# Patient Record
Sex: Female | Born: 1960 | Race: Black or African American | Hispanic: No | Smoking: Former smoker
Health system: Southern US, Community
[De-identification: ages and names within clinical notes are randomized; demographics above are authoritative.]

## PROBLEM LIST (undated history)

## (undated) DIAGNOSIS — M797 Fibromyalgia: Secondary | ICD-10-CM

## (undated) DIAGNOSIS — J45909 Unspecified asthma, uncomplicated: Secondary | ICD-10-CM

## (undated) DIAGNOSIS — E785 Hyperlipidemia, unspecified: Secondary | ICD-10-CM

## (undated) DIAGNOSIS — G4730 Sleep apnea, unspecified: Secondary | ICD-10-CM

## (undated) DIAGNOSIS — R7989 Other specified abnormal findings of blood chemistry: Secondary | ICD-10-CM

## (undated) DIAGNOSIS — R519 Headache, unspecified: Secondary | ICD-10-CM

## (undated) DIAGNOSIS — K219 Gastro-esophageal reflux disease without esophagitis: Secondary | ICD-10-CM

## (undated) DIAGNOSIS — K5792 Diverticulitis of intestine, part unspecified, without perforation or abscess without bleeding: Secondary | ICD-10-CM

## (undated) DIAGNOSIS — Z8739 Personal history of other diseases of the musculoskeletal system and connective tissue: Secondary | ICD-10-CM

## (undated) DIAGNOSIS — J449 Chronic obstructive pulmonary disease, unspecified: Secondary | ICD-10-CM

## (undated) DIAGNOSIS — F32A Depression, unspecified: Secondary | ICD-10-CM

## (undated) DIAGNOSIS — R011 Cardiac murmur, unspecified: Secondary | ICD-10-CM

## (undated) DIAGNOSIS — Z9989 Dependence on other enabling machines and devices: Secondary | ICD-10-CM

## (undated) DIAGNOSIS — I10 Essential (primary) hypertension: Secondary | ICD-10-CM

## (undated) DIAGNOSIS — M5136 Other intervertebral disc degeneration, lumbar region: Secondary | ICD-10-CM

## (undated) DIAGNOSIS — R51 Headache: Secondary | ICD-10-CM

## (undated) DIAGNOSIS — M51369 Other intervertebral disc degeneration, lumbar region without mention of lumbar back pain or lower extremity pain: Secondary | ICD-10-CM

## (undated) DIAGNOSIS — K760 Fatty (change of) liver, not elsewhere classified: Secondary | ICD-10-CM

## (undated) DIAGNOSIS — H8110 Benign paroxysmal vertigo, unspecified ear: Secondary | ICD-10-CM

## (undated) DIAGNOSIS — F329 Major depressive disorder, single episode, unspecified: Secondary | ICD-10-CM

## (undated) HISTORY — PX: OTHER SURGICAL HISTORY: SHX169

## (undated) HISTORY — DX: Chronic obstructive pulmonary disease, unspecified: J44.9

## (undated) HISTORY — DX: Hyperlipidemia, unspecified: E78.5

## (undated) HISTORY — DX: Personal history of other diseases of the musculoskeletal system and connective tissue: Z87.39

## (undated) HISTORY — DX: Essential (primary) hypertension: I10

## (undated) HISTORY — PX: ABDOMINAL HYSTERECTOMY: SHX81

## (undated) HISTORY — DX: Other specified abnormal findings of blood chemistry: R79.89

## (undated) HISTORY — DX: Cardiac murmur, unspecified: R01.1

## (undated) HISTORY — DX: Unspecified asthma, uncomplicated: J45.909

## (undated) HISTORY — PX: ABDOMINAL SURGERY: SHX537

## (undated) HISTORY — DX: Dependence on other enabling machines and devices: Z99.89

---

## 2002-04-22 ENCOUNTER — Emergency Department (HOSPITAL_COMMUNITY): Admission: EM | Admit: 2002-04-22 | Discharge: 2002-04-22 | Payer: Self-pay | Admitting: Emergency Medicine

## 2003-11-30 ENCOUNTER — Inpatient Hospital Stay (HOSPITAL_COMMUNITY): Admission: EM | Admit: 2003-11-30 | Discharge: 2003-12-15 | Payer: Self-pay | Admitting: Emergency Medicine

## 2003-12-25 ENCOUNTER — Emergency Department (HOSPITAL_COMMUNITY): Admission: EM | Admit: 2003-12-25 | Discharge: 2003-12-25 | Payer: Self-pay | Admitting: Emergency Medicine

## 2004-01-02 ENCOUNTER — Emergency Department: Payer: Self-pay | Admitting: General Practice

## 2004-01-04 ENCOUNTER — Emergency Department: Payer: Self-pay | Admitting: Emergency Medicine

## 2005-10-15 ENCOUNTER — Emergency Department: Payer: Self-pay | Admitting: Emergency Medicine

## 2006-11-15 ENCOUNTER — Emergency Department (HOSPITAL_COMMUNITY): Admission: EM | Admit: 2006-11-15 | Discharge: 2006-11-15 | Payer: Self-pay | Admitting: Emergency Medicine

## 2007-04-03 ENCOUNTER — Emergency Department (HOSPITAL_COMMUNITY): Admission: EM | Admit: 2007-04-03 | Discharge: 2007-04-03 | Payer: Self-pay | Admitting: Family Medicine

## 2007-05-11 ENCOUNTER — Emergency Department (HOSPITAL_COMMUNITY): Admission: EM | Admit: 2007-05-11 | Discharge: 2007-05-11 | Payer: Self-pay | Admitting: Emergency Medicine

## 2007-07-07 ENCOUNTER — Ambulatory Visit: Payer: Self-pay | Admitting: Internal Medicine

## 2007-07-22 ENCOUNTER — Ambulatory Visit: Payer: Self-pay | Admitting: Internal Medicine

## 2007-09-30 ENCOUNTER — Ambulatory Visit: Payer: Self-pay | Admitting: Internal Medicine

## 2007-10-13 ENCOUNTER — Ambulatory Visit: Payer: Self-pay | Admitting: *Deleted

## 2007-11-29 ENCOUNTER — Emergency Department (HOSPITAL_COMMUNITY): Admission: EM | Admit: 2007-11-29 | Discharge: 2007-11-30 | Payer: Self-pay | Admitting: *Deleted

## 2009-01-01 ENCOUNTER — Emergency Department: Payer: Self-pay | Admitting: Emergency Medicine

## 2009-01-23 ENCOUNTER — Emergency Department: Payer: Self-pay | Admitting: Emergency Medicine

## 2009-06-05 ENCOUNTER — Emergency Department (HOSPITAL_COMMUNITY): Admission: EM | Admit: 2009-06-05 | Discharge: 2009-06-05 | Payer: Self-pay | Admitting: Emergency Medicine

## 2010-03-10 ENCOUNTER — Emergency Department (HOSPITAL_COMMUNITY)
Admission: EM | Admit: 2010-03-10 | Discharge: 2010-03-10 | Payer: Self-pay | Source: Home / Self Care | Admitting: Emergency Medicine

## 2010-07-25 ENCOUNTER — Emergency Department (HOSPITAL_COMMUNITY)
Admission: EM | Admit: 2010-07-25 | Discharge: 2010-07-25 | Disposition: A | Discharge status: Home / Self Care | Payer: Self-pay | Attending: Emergency Medicine | Admitting: Emergency Medicine

## 2010-07-25 DIAGNOSIS — K029 Dental caries, unspecified: Secondary | ICD-10-CM | POA: Insufficient documentation

## 2010-07-25 DIAGNOSIS — R21 Rash and other nonspecific skin eruption: Secondary | ICD-10-CM | POA: Insufficient documentation

## 2010-07-25 DIAGNOSIS — K089 Disorder of teeth and supporting structures, unspecified: Secondary | ICD-10-CM | POA: Insufficient documentation

## 2010-07-25 LAB — GLUCOSE, CAPILLARY

## 2010-08-10 NOTE — H&P (Signed)
NAMESHARMEL, BALLANTINE NO.:  [id number]   MEDICAL RECORD NO.:  [id number]                   PATIENT TYPE:  EMS   LOCATION:  MAJO                                 FACILITY:  [facility]   PHYSICIAN:  Ollen Gross. Vernell Morgans, M.D.              DATE OF BIRTH:  [birth year]   DATE OF ADMISSION:  11/30/2003  DATE OF DISCHARGE:                                HISTORY & PHYSICAL   [name] is a 50 year old black female who presents today to the  emergency department with abdominal pain that started on Saturday.  She  describes the pain as diffuse, pain is not improved at all.  She has also  had nausea and vomiting.  She denies any fevers.  When the pain did not get  any better, she was brought to the emergency department today.  She denies  any fevers, chills, chest pain, shortness of breath, diarrhea, dysuria.  The  rest of the review of systems is unremarkable.   PAST MEDICAL HISTORY:  Significant for fibromyalgia, diabetes, asthma, and  acid reflux.   PAST SURGICAL HISTORY:  Significant for a hysterectomy.   MEDICATIONS:  Some diabetes medicine.   ALLERGIES:  BEXTRA.   SOCIAL HISTORY:  She has smoked in the recent past but denies any alcohol  use.   FAMILY HISTORY:  Noncontributory.   PHYSICAL EXAMINATION:  VITAL SIGNS:  Her temperature is 97.7, pulse of 91,  blood pressure 132/84.  GENERAL:  She is a well-developed, well-nourished black female in no acute  distress.  On psychological, she is alert and oriented x3 with no evidence  of state of anxiety or depression.  SKIN:  Warm and dry with no jaundice.  HEENT:  Eyes:  Her extraocular muscles are intact.  Pupils equal, round, and  reactive to light.  Sclerae are nonicteric.   The rest of her actual physical exam the patient refused.   Her labs were reviewed and were significant for a white count of 16,200.  Her CT scan was reviewed with the radiologist and was significant for a mid-  small bowel  obstruction with no free fluid or free air.   ASSESSMENT AND PLAN:  This is a 50 year old black female with what appears  to be a high-grade mid-small bowel obstruction.  She refuses any physical  exam at this point.  She does have an elevated white count at 16,200.  We  will plan to admit her for IV hydration, bowel rest, NG tube suctioning, and  close monitoring.  We will repeat  her abdominal films in the morning.  If she improves, then we may continue  her observation.  If she does not improve, she may require an exploration to  fix her bowel obstruction.  I have discussed this with her and her family,  and they seem to understand and agree with this treatment plan.  Ollen Gross. Vernell Morgans, M.D.    PST/MEDQ  D:  11/30/2003  T:  11/30/2003  Job:  [id number]

## 2010-08-10 NOTE — Op Note (Signed)
[name], [name] NO.:  [id number]   MEDICAL RECORD NO.:  [id number]                   PATIENT TYPE:  INP   LOCATION:  5706                                 FACILITY:  [facility]   PHYSICIAN:  Jimmye Norman III, M.D.               DATE OF BIRTH:  10/07/1960   DATE OF PROCEDURE:  DATE OF DISCHARGE:                                 OPERATIVE REPORT   DATE OF OPERATION:  December 02, 2003.   PREOPERATIVE DIAGNOSIS:  Small bowel obstruction.   POSTOPERATIVE DIAGNOSIS:  Small bowel obstruction secondary to adhesions.   PROCEDURE:  1.  Exploratory laparotomy.  2.  Enterolysis.  3.  Enterorrhaphy   SURGEON:  Jimmye Norman, MD.   ASSISTANT:  Gabrielle Dare. Janee Morn, MD.   ANESTHESIA:  General endotracheal.   ESTIMATED BLOOD LOSS:  Less than 50 mL.   COMPLICATIONS:  Small bowel injury.   CONDITION:  Stable.   INDICATION FOR OPERATION:  The patient is a 50 year old female with a small  bowel obstruction, which was not resolving with NG tube decompression, who  now comes to the OR for exploration.   FINDINGS:  The patient had a high-grade, complete, small bowel obstruction  to the mid to distal jejunum.  It was secondary to a twist of the small  bowel, which was attached to the anterior abdominal wall fascia.   OPERATION:  The patient was taken to the operating room and placed on the  table in the supine position.  After an adequate endotracheal anesthetic was  administered, she was prepped and draped in the usual sterile manner and  exposed to the midline.   A #10 blade was used to make a midline incision about 4 cm above the  umbilicus to just below the umbilicus.  It was taken to the right of the  umbilicus, down through the midline fascia.  As we got into the midline  fascia just above the umbilicus, we were able to dissect down and find that  there were adhesions of omentum and small bowel to the anterior abdominal  wall.  Just below the umbilicus, a  small bowel enterotomy was made as we  were getting through the fascia using electrocautery.  This was subsequently  repaired with 3-0 silk Lembert stitches, completely closing it off to an  efflux of fluid, which was tested with pressure from both sides.   We took down the adhesions of the other small bowel to the anterior  abdominal wall, and also the omentum down into the pelvis.  There were deep  pelvic adhesions of small bowel into the pelvis, which were taken down with  Metzenbaum scissors and also with cautery, and we subsequently removed all  adhesions to the small bowel heading towards the pelvis.  We ran the small  bowel from the ligament of Treitz all the way to the terminal ileum, where  there were no further adhesions found and  no other injuries.  The repaired  bowel looked fine and no evidence of leakage.  We milked the effluent of  fluid from the dilated loops, which were proximal to where it was attached  to the anterior fascia to the ligament of Treitz and into the stomach, where  it was subsequently aspirated out.  The patient did well.  We palpated the  NG tube to be in proper position intraoperatively.  We had completely  finished with the milking and the freeing of all adhesions, we explored the  liver, gallbladder, and so forth, and they all appeared to be fine.  There  was no other evidence of injury or disease processes.  There was no evidence  of appendix left in place; however, we did not specifically mobilize the  cecum for this.   We irrigated with copious amounts of warm saline solution, then we  reapproximated the fascia using a running #1 PDS suture, we closed the skin  using stainless steel staples.  Sterile dressings were applied including  antibiotic ointment.                                               Kathrin Ruddy, M.D.    JW/MEDQ  D:  12/02/2003  T:  12/02/2003  Job:  [id number]

## 2010-08-10 NOTE — Discharge Summary (Signed)
NAMESECILIA, APPS NO.:  [id number]   MEDICAL RECORD NO.:  [id number]          PATIENT TYPE:  INP   LOCATION:  5706                         FACILITY:  [facility]   PHYSICIAN:  Jimmye Norman III, M.D.  DATE OF BIRTH:  03/04/1961   DATE OF ADMISSION:  11/30/2003  DATE OF DISCHARGE:  12/15/2003                                 DISCHARGE SUMMARY   DISCHARGE DIAGNOSIS:  Mechanical small bowel obstruction from adhesions.   DISCHARGE MEDICATIONS:  Darvocet p.r.n. for pain.   DISCHARGE INSTRUCTIONS:  Diet unrestricted.  She is to follow up to see me  in about two weeks.   CONDITION ON DISCHARGE:  Good.   HOSPITAL COURSE:  The patient was admitted by Dr. Carolynne Edouard with a small bowel  obstruction.  In his absence, I assumed the patient's care.  Because of  nonresolution of her symptoms on the first two days of her hospitalization,  she was taken to surgery on September 9 at which time she had an exploratory  laparotomy, enterolysis, and enterorrhaphy for a small bowel enterotomy  which was minor.  Postoperatively, she did fairly well, however, it took  several days for her NG tube drainage to cease to where clamping could be  tolerated and she could be started on a diet.  She was on TNA for a good  portion of this time.  Attempts had been made to remove her NG tube  initially, however, this was followed by prompt nausea and vomiting and the  need to reinsert the NG tube.  After the second insertion after about five  days, her bowel was recovered, and she was able to start back on a diet, and  was subsequently discharged to home.       JW/MEDQ  D:  01/12/2004  T:  01/12/2004  Job:  [id number]   cc:   Ollen Gross. Vernell Morgans, M.D.  [address]  [place]  [place] [address]  Fax: [phone]

## 2010-11-28 ENCOUNTER — Emergency Department (HOSPITAL_COMMUNITY)
Admission: EM | Admit: 2010-11-28 | Discharge: 2010-11-28 | Disposition: A | Discharge status: Home / Self Care | Payer: Self-pay | Attending: Emergency Medicine | Admitting: Emergency Medicine

## 2010-11-28 ENCOUNTER — Emergency Department (HOSPITAL_COMMUNITY): Payer: Self-pay

## 2010-11-28 ENCOUNTER — Encounter (HOSPITAL_COMMUNITY): Payer: Self-pay

## 2010-11-28 DIAGNOSIS — R509 Fever, unspecified: Secondary | ICD-10-CM | POA: Insufficient documentation

## 2010-11-28 DIAGNOSIS — F329 Major depressive disorder, single episode, unspecified: Secondary | ICD-10-CM | POA: Insufficient documentation

## 2010-11-28 DIAGNOSIS — K219 Gastro-esophageal reflux disease without esophagitis: Secondary | ICD-10-CM | POA: Insufficient documentation

## 2010-11-28 DIAGNOSIS — R221 Localized swelling, mass and lump, neck: Secondary | ICD-10-CM | POA: Insufficient documentation

## 2010-11-28 DIAGNOSIS — R599 Enlarged lymph nodes, unspecified: Secondary | ICD-10-CM | POA: Insufficient documentation

## 2010-11-28 DIAGNOSIS — F3289 Other specified depressive episodes: Secondary | ICD-10-CM | POA: Insufficient documentation

## 2010-11-28 DIAGNOSIS — K029 Dental caries, unspecified: Secondary | ICD-10-CM | POA: Insufficient documentation

## 2010-11-28 DIAGNOSIS — E119 Type 2 diabetes mellitus without complications: Secondary | ICD-10-CM | POA: Insufficient documentation

## 2010-11-28 DIAGNOSIS — H9209 Otalgia, unspecified ear: Secondary | ICD-10-CM | POA: Insufficient documentation

## 2010-11-28 DIAGNOSIS — R070 Pain in throat: Secondary | ICD-10-CM | POA: Insufficient documentation

## 2010-11-28 DIAGNOSIS — R220 Localized swelling, mass and lump, head: Secondary | ICD-10-CM | POA: Insufficient documentation

## 2010-11-28 DIAGNOSIS — R1310 Dysphagia, unspecified: Secondary | ICD-10-CM | POA: Insufficient documentation

## 2010-11-28 HISTORY — DX: Fibromyalgia: M79.7

## 2010-11-28 HISTORY — DX: Gastro-esophageal reflux disease without esophagitis: K21.9

## 2010-11-28 LAB — CBC
HCT: 36.9 % (ref 36.0–46.0)
Hemoglobin: 12.4 g/dL (ref 12.0–15.0)
MCHC: 33.6 g/dL (ref 30.0–36.0)
MCV: 85 fL (ref 78.0–100.0)
Platelets: 230 10*3/uL (ref 150–400)
RDW: 14.1 % (ref 11.5–15.5)
WBC: 11.3 10*3/uL — ABNORMAL HIGH (ref 4.0–10.5)

## 2010-11-28 LAB — BASIC METABOLIC PANEL
BUN: 10 mg/dL (ref 6–23)
CO2: 22 mEq/L (ref 19–32)
Calcium: 9.4 mg/dL (ref 8.4–10.5)
Chloride: 105 mEq/L (ref 96–112)
Potassium: 3.6 mEq/L (ref 3.5–5.1)
Sodium: 138 mEq/L (ref 135–145)

## 2010-11-28 MED ORDER — IOHEXOL 300 MG/ML  SOLN
75.0000 mL | Freq: Once | INTRAMUSCULAR | Status: AC | PRN
  Administered 2010-11-28: 75 mL via INTRAVENOUS

## 2010-12-03 ENCOUNTER — Emergency Department (HOSPITAL_COMMUNITY)
Admission: EM | Admit: 2010-12-03 | Discharge: 2010-12-03 | Disposition: A | Discharge status: Home / Self Care | Payer: Self-pay | Attending: Emergency Medicine | Admitting: Emergency Medicine

## 2010-12-03 DIAGNOSIS — E119 Type 2 diabetes mellitus without complications: Secondary | ICD-10-CM | POA: Insufficient documentation

## 2010-12-03 DIAGNOSIS — R221 Localized swelling, mass and lump, neck: Secondary | ICD-10-CM | POA: Insufficient documentation

## 2010-12-03 DIAGNOSIS — F329 Major depressive disorder, single episode, unspecified: Secondary | ICD-10-CM | POA: Insufficient documentation

## 2010-12-03 DIAGNOSIS — R220 Localized swelling, mass and lump, head: Secondary | ICD-10-CM | POA: Insufficient documentation

## 2010-12-03 DIAGNOSIS — K089 Disorder of teeth and supporting structures, unspecified: Secondary | ICD-10-CM | POA: Insufficient documentation

## 2010-12-03 DIAGNOSIS — K219 Gastro-esophageal reflux disease without esophagitis: Secondary | ICD-10-CM | POA: Insufficient documentation

## 2010-12-03 DIAGNOSIS — F3289 Other specified depressive episodes: Secondary | ICD-10-CM | POA: Insufficient documentation

## 2010-12-03 DIAGNOSIS — K047 Periapical abscess without sinus: Secondary | ICD-10-CM | POA: Insufficient documentation

## 2010-12-14 LAB — HEPATIC FUNCTION PANEL
ALT: 17
Total Protein: 7.2

## 2010-12-14 LAB — CBC
HCT: 42
MCHC: 33.2
MCV: 86.3
Platelets: 266
RDW: 14.1
WBC: 9.9

## 2010-12-14 LAB — I-STAT 8, (EC8 V) (CONVERTED LAB)
Acid-base deficit: 2
Bicarbonate: 23.7
Glucose, Bld: 102 — ABNORMAL HIGH
Operator id: 198171
Potassium: 4
Sodium: 139
TCO2: 25
pCO2, Ven: 40.9 — ABNORMAL LOW

## 2010-12-14 LAB — URINALYSIS, ROUTINE W REFLEX MICROSCOPIC
Bilirubin Urine: NEGATIVE
Glucose, UA: NEGATIVE
Ketones, ur: NEGATIVE
Nitrite: NEGATIVE
Urobilinogen, UA: 0.2

## 2010-12-14 LAB — DIFFERENTIAL
Basophils Relative: 1
Eosinophils Absolute: 0.3
Monocytes Relative: 6
Neutro Abs: 5.8

## 2010-12-14 LAB — PREGNANCY, URINE: Preg Test, Ur: NEGATIVE

## 2010-12-14 LAB — POCT I-STAT CREATININE
Creatinine, Ser: 0.7
Operator id: 198171

## 2011-12-26 ENCOUNTER — Emergency Department (HOSPITAL_COMMUNITY)
Admission: EM | Admit: 2011-12-26 | Discharge: 2011-12-26 | Disposition: A | Discharge status: Home / Self Care | Payer: Self-pay | Attending: Emergency Medicine | Admitting: Emergency Medicine

## 2011-12-26 ENCOUNTER — Encounter (HOSPITAL_COMMUNITY): Payer: Self-pay | Admitting: Emergency Medicine

## 2011-12-26 DIAGNOSIS — G4700 Insomnia, unspecified: Secondary | ICD-10-CM | POA: Insufficient documentation

## 2011-12-26 DIAGNOSIS — F430 Acute stress reaction: Secondary | ICD-10-CM | POA: Insufficient documentation

## 2011-12-26 DIAGNOSIS — F439 Reaction to severe stress, unspecified: Secondary | ICD-10-CM

## 2011-12-26 DIAGNOSIS — E119 Type 2 diabetes mellitus without complications: Secondary | ICD-10-CM | POA: Insufficient documentation

## 2011-12-26 DIAGNOSIS — Z8719 Personal history of other diseases of the digestive system: Secondary | ICD-10-CM | POA: Insufficient documentation

## 2011-12-26 DIAGNOSIS — IMO0001 Reserved for inherently not codable concepts without codable children: Secondary | ICD-10-CM | POA: Insufficient documentation

## 2011-12-26 DIAGNOSIS — J45909 Unspecified asthma, uncomplicated: Secondary | ICD-10-CM | POA: Insufficient documentation

## 2011-12-26 DIAGNOSIS — F17200 Nicotine dependence, unspecified, uncomplicated: Secondary | ICD-10-CM | POA: Insufficient documentation

## 2011-12-26 MED ORDER — CELECOXIB 100 MG PO CAPS: 100.0000 mg | ORAL_CAPSULE | Freq: Two times a day (BID) | ORAL | Status: DC

## 2011-12-26 NOTE — ED Notes (Signed)
States husband in ICU here for two weeks- feels exhausted, tired, but unable to sleep. Fibromyalgia pain in legs. Was a [facility] patient-- no reg doctor now.

## 2011-12-26 NOTE — ED Notes (Signed)
MD at bedside. 

## 2011-12-26 NOTE — ED Notes (Signed)
Pt reports hx fibromyalgia, reports increasing pain "all over" due to inability to get celebrex from [facility]. Pt reports increased stress due to friend in ICU and on vent, having difficulty dealing with anxiety and stress. Reports insomnia and depression.

## 2011-12-26 NOTE — ED Provider Notes (Signed)
History     CSN: [id number]  Arrival date & time 12/26/11  8295   First MD Initiated Contact with Patient 12/26/11 0815      Chief Complaint  Patient presents with  . Insomnia  . fibromyalgia pain     (Consider location/radiation/quality/duration/timing/severity/associated sxs/prior treatment) HPI The patient presents with 2 main complaints. First, she has a history of fibromyalgia, for which she used to take Celebrex.  She notes that she has been out of his medication for some time.  The clinic that she obtain her medications from closed.  She notes over the past weeks she is developed diffuse soreness, consistent with prior fibromyalgia episodes.  She denies focal pain, fevers, chills, vomiting, nausea. The patient also complains of new insomnia.  She states over the past weeks she is become increasingly insomniac. Notably, the patient is currently at this facility with great frequency do to a colleague who is in the ICU.  She also this has caused her a significant amount of stress. Past Medical History  Diagnosis Date  . Asthma   . Diabetes mellitus   . GERD (gastroesophageal reflux disease)   . Fibromyalgia   . Fibromyalgia     Past Surgical History  Procedure Date  . Abdominal surgery     No family history on file.  History  Substance Use Topics  . Smoking status: Current Every Day Smoker -- 0.5 packs/day  . Smokeless tobacco: Not on file  . Alcohol Use: Yes     stopped 1 week ago, social drinker    OB History    Grav Para Term Preterm Abortions TAB SAB Ect Mult Living                  Review of Systems  All other systems reviewed and are negative.    Allergies  Bactrim and Benadryl  Home Medications   Current Outpatient Rx  Name Route Sig Dispense Refill  . CELECOXIB 100 MG PO CAPS Oral Take 1 capsule (100 mg total) by mouth 2 (two) times daily. 30 capsule 0    BP 119/75  Pulse 100  Temp 97.8 F (36.6 C) (Oral)  Resp 16  SpO2  100%  Physical Exam  Nursing note and vitals reviewed. Constitutional: She is oriented to person, place, and time. She appears well-developed and well-nourished. No distress.  HENT:  Head: Normocephalic and atraumatic.  Eyes: Conjunctivae normal and EOM are normal.  Cardiovascular: Normal rate and regular rhythm.   Pulmonary/Chest: Effort normal and breath sounds normal. No stridor. No respiratory distress.  Abdominal: She exhibits no distension.  Musculoskeletal: She exhibits no edema.  Neurological: She is alert and oriented to person, place, and time. No cranial nerve deficit.  Skin: Skin is warm and dry.  Psychiatric: She has a normal mood and affect.    ED Course  Procedures (including critical care time)  Labs Reviewed - No data to display No results found.   1. Insomnia   2. Stress       MDM  This generally well-appearing female with fibromyalgia, no current medication use presents with concerns of diffuse pain, insomnia.  On exam she is in no distress with unremarkable vital signs.  We discussed the need for reinitiation of her medication, with only single therapy prior to starting multiple medications for insomnia and fibromyalgia.  We discussed the need for primary care followup, the patient was discharged in stable condition with outpatient follow up resources, restarting of her Celebrex.  Gerhard Munch, MD 12/26/11 [phone]

## 2011-12-28 ENCOUNTER — Encounter (HOSPITAL_COMMUNITY): Payer: Self-pay | Admitting: Emergency Medicine

## 2011-12-28 ENCOUNTER — Emergency Department (HOSPITAL_COMMUNITY)
Admission: EM | Admit: 2011-12-28 | Discharge: 2011-12-28 | Disposition: A | Discharge status: Home / Self Care | Payer: Self-pay | Attending: Emergency Medicine | Admitting: Emergency Medicine

## 2011-12-28 DIAGNOSIS — M549 Dorsalgia, unspecified: Secondary | ICD-10-CM | POA: Insufficient documentation

## 2011-12-28 DIAGNOSIS — M797 Fibromyalgia: Secondary | ICD-10-CM

## 2011-12-28 DIAGNOSIS — IMO0001 Reserved for inherently not codable concepts without codable children: Secondary | ICD-10-CM | POA: Insufficient documentation

## 2011-12-28 DIAGNOSIS — Z23 Encounter for immunization: Secondary | ICD-10-CM | POA: Insufficient documentation

## 2011-12-28 MED ORDER — MELOXICAM 7.5 MG PO TABS
7.5000 mg | ORAL_TABLET | Freq: Once | ORAL | Status: AC
  Administered 2011-12-28: 7.5 mg via ORAL
  Filled 2011-12-28: qty 1

## 2011-12-28 MED ORDER — MELOXICAM 7.5 MG PO TABS: 7.5000 mg | ORAL_TABLET | Freq: Every day | ORAL | Status: DC

## 2011-12-28 NOTE — ED Notes (Signed)
Pt requesting medication for pain.

## 2012-01-11 NOTE — ED Provider Notes (Signed)
History     CSN: [id number]  Arrival date & time 12/28/11  1116   First MD Initiated Contact with Patient 12/28/11 1238      Chief Complaint  Patient presents with  . Medication Refill    (Consider location/radiation/quality/duration/timing/severity/associated sxs/prior treatment) Patient is a 51 y.o. female presenting with leg pain. The history is provided by the patient.  Leg Pain  The incident occurred 2 days ago. There was no injury mechanism. Associated symptoms comments: Patient with a history of fibromyalgia with foci of pain in thighs, presents with thigh pain. She has been caring for her husband and under stress with his hospitalization and feels she has exacerbated her usual symptoms of fibromyalgia. No fever, injury, SOB, N, V..    Past Medical History  Diagnosis Date  . Asthma   . Diabetes mellitus   . GERD (gastroesophageal reflux disease)   . Fibromyalgia   . Fibromyalgia     Past Surgical History  Procedure Date  . Abdominal surgery     Family History  Problem Relation Age of Onset  . Diabetes Mother   . Hypertension Mother     History  Substance Use Topics  . Smoking status: Current Every Day Smoker -- 0.5 packs/day    Types: Cigarettes  . Smokeless tobacco: Not on file  . Alcohol Use: Yes     stopped 1 week ago, social drinker    OB History    Grav Para Term Preterm Abortions TAB SAB Ect Mult Living                  Review of Systems  Constitutional: Negative for fever and chills.  HENT: Negative.   Respiratory: Negative.   Cardiovascular: Negative.   Gastrointestinal: Negative.   Musculoskeletal:       See HPI.  Skin: Negative.   Neurological: Negative.     Allergies  Bactrim and Benadryl  Home Medications   Current Outpatient Rx  Name Route Sig Dispense Refill  . IBUPROFEN 200 MG PO TABS Oral Take 800 mg by mouth every 6 (six) hours as needed. For pain    . CELECOXIB 100 MG PO CAPS Oral Take 1 capsule (100 mg total) by  mouth 2 (two) times daily. 30 capsule 0  . MELOXICAM 7.5 MG PO TABS Oral Take 1 tablet (7.5 mg total) by mouth daily. 30 tablet 0    BP 164/95  Pulse 85  Temp 97.6 F (36.4 C) (Oral)  Resp 18  Wt 113 lb (51.256 kg)  SpO2 97%  Physical Exam  Constitutional: She is oriented to person, place, and time. She appears well-developed and well-nourished.  Neck: Normal range of motion.  Cardiovascular: Normal rate and regular rhythm.   No murmur heard.      Distal pulses in LE's present and equal.  Pulmonary/Chest: Effort normal and breath sounds normal. She has no wheezes. She has no rales. She exhibits no tenderness.  Abdominal: Soft. There is no tenderness. There is no rebound and no guarding.  Musculoskeletal: Normal range of motion. She exhibits no edema.       Thighs bilaterally tender without redness, swelling, discoloration.   Neurological: She is alert and oriented to person, place, and time.  Skin: Skin is warm and dry.    ED Course  Procedures (including critical care time)  Labs Reviewed - No data to display No results found.   1. Back pain   2. Fibromyalgia       MDM  Usual pain of fibromyalgia without other concerning finding.        Rodena Medin, PA-C 01/11/12 1201

## 2012-01-13 NOTE — ED Provider Notes (Signed)
Medical screening examination/treatment/procedure(s) were performed by non-physician practitioner and as supervising physician I was immediately available for consultation/collaboration.  Gerhard Munch, MD 01/13/12 2154

## 2013-01-05 ENCOUNTER — Emergency Department (HOSPITAL_COMMUNITY)
Admission: EM | Admit: 2013-01-05 | Discharge: 2013-01-05 | Disposition: A | Discharge status: Home / Self Care | Payer: Self-pay | Attending: Emergency Medicine | Admitting: Emergency Medicine

## 2013-01-05 ENCOUNTER — Encounter (HOSPITAL_COMMUNITY): Payer: Self-pay | Admitting: Emergency Medicine

## 2013-01-05 DIAGNOSIS — IMO0001 Reserved for inherently not codable concepts without codable children: Secondary | ICD-10-CM | POA: Insufficient documentation

## 2013-01-05 DIAGNOSIS — G501 Atypical facial pain: Secondary | ICD-10-CM | POA: Insufficient documentation

## 2013-01-05 DIAGNOSIS — F1010 Alcohol abuse, uncomplicated: Secondary | ICD-10-CM | POA: Insufficient documentation

## 2013-01-05 DIAGNOSIS — F329 Major depressive disorder, single episode, unspecified: Secondary | ICD-10-CM

## 2013-01-05 DIAGNOSIS — F17200 Nicotine dependence, unspecified, uncomplicated: Secondary | ICD-10-CM | POA: Insufficient documentation

## 2013-01-05 DIAGNOSIS — F3289 Other specified depressive episodes: Secondary | ICD-10-CM | POA: Insufficient documentation

## 2013-01-05 DIAGNOSIS — J45909 Unspecified asthma, uncomplicated: Secondary | ICD-10-CM | POA: Insufficient documentation

## 2013-01-05 DIAGNOSIS — K219 Gastro-esophageal reflux disease without esophagitis: Secondary | ICD-10-CM | POA: Insufficient documentation

## 2013-01-05 DIAGNOSIS — R42 Dizziness and giddiness: Secondary | ICD-10-CM | POA: Insufficient documentation

## 2013-01-05 DIAGNOSIS — E119 Type 2 diabetes mellitus without complications: Secondary | ICD-10-CM | POA: Insufficient documentation

## 2013-01-05 MED ORDER — HYDROCODONE-ACETAMINOPHEN 5-325 MG PO TABS
1.0000 | ORAL_TABLET | Freq: Once | ORAL | Status: AC
  Administered 2013-01-05: 1 via ORAL
  Filled 2013-01-05: qty 1

## 2013-01-05 MED ORDER — IBUPROFEN 600 MG PO TABS: 600.0000 mg | ORAL_TABLET | Freq: Three times a day (TID) | ORAL | Status: DC | PRN

## 2013-01-05 NOTE — ED Notes (Signed)
Pt escorted to discharge window. Verbalized understanding discharge instructions. In no acute distress.  Vitals are stable.

## 2013-01-05 NOTE — ED Notes (Addendum)
Pt from home w/  C/o of left sided facial pain radiating to left chest and arm  for "a couple months". Pt states she has been putting it off d/t friend has been this sick. Pt states she has been dizzy and blacking out. Last black out happened a "couple a weeks ago". Pt has HX of fibromyalgia.

## 2013-01-05 NOTE — ED Provider Notes (Signed)
CSN: [id number]     Arrival date & time 01/05/13  1134 History   First MD Initiated Contact with Patient 01/05/13 1148     Chief Complaint  Patient presents with  . Facial Pain    HPI Patient reports ongoing left-sided facial pain over the past 4-5 months.  She states that her left-sided facial pain is been constant and will occasionally radiate towards her left chest.  She denies palpitations.  No shortness of breath.  She has ongoing discomfort at this time.  No dental pain.  No difficulty breathing or swallowing.  No nausea vomiting or diarrhea.  She does have some intermittent lightheadedness.  She's been eating and drinking normally.  She does not have a primary care physician.  She states that she is drinking approximately 240 ounce beers every day and that this is an increased amount for her.  She states she is more sad but does not have homicidal or suicidal thoughts.  She is currently caring for a long-term partner who has terminal cancer.  She states this is causing a lot of stress and anxiety.    Past Medical History  Diagnosis Date  . Asthma   . Diabetes mellitus   . GERD (gastroesophageal reflux disease)   . Fibromyalgia   . Fibromyalgia    Past Surgical History  Procedure Laterality Date  . Abdominal surgery    . Abdominal hysterectomy     Family History  Problem Relation Age of Onset  . Diabetes Mother   . Hypertension Mother    History  Substance Use Topics  . Smoking status: Current Every Day Smoker -- 0.50 packs/day    Types: Cigarettes  . Smokeless tobacco: Current User  . Alcohol Use: No     Comment: stopped 1 week ago, social drinker   OB History   Grav Para Term Preterm Abortions TAB SAB Ect Mult Living                 Review of Systems  All other systems reviewed and are negative.    Allergies  Benadryl and Bextra  Home Medications   Current Outpatient Rx  Name  Route  Sig  Dispense  Refill  . ibuprofen (ADVIL,MOTRIN) 200 MG tablet  Oral   Take 800 mg by mouth every 6 (six) hours as needed. For pain         . ibuprofen (ADVIL,MOTRIN) 600 MG tablet   Oral   Take 1 tablet (600 mg total) by mouth every 8 (eight) hours as needed for pain.   15 tablet   0    BP 145/87  Pulse 87  Temp(Src) 97.8 F (36.6 C) (Oral)  Resp 18  Ht 4\' 7"  (1.397 m)  Wt 137 lb (62.143 kg)  BMI 31.84 kg/m2  SpO2 96% Physical Exam  Nursing note and vitals reviewed. Constitutional: She is oriented to person, place, and time. She appears well-developed and well-nourished. No distress.  HENT:  Head: Normocephalic and atraumatic.  Poor dentition without active dental pathology.  No gingival swelling or fluctuance.  No facial swelling.  Mild tenderness along the angle of her left mandible.  No swelling or ecchymosis.  No trismus or malocclusion.  Bilateral TMs are normal.  No lymphadenopathy noted in in her neck.  Thyroid normal in size.  Eyes: EOM are normal.  Neck: Normal range of motion.  Cardiovascular: Normal rate, regular rhythm and normal heart sounds.   Pulmonary/Chest: Effort normal and breath sounds normal.  Abdominal: Soft.  She exhibits no distension. There is no tenderness.  Musculoskeletal: Normal range of motion.  Neurological: She is alert and oriented to person, place, and time.  Skin: Skin is warm and dry.  Psychiatric: She has a normal mood and affect. Judgment normal.    ED Course  Procedures (including critical care time) Labs Review Labs Reviewed - No data to display Imaging Review No results found.  ECG interpretation   Date: 01/05/2013  Rate: 87  Rhythm: normal sinus rhythm  QRS Axis: normal  Intervals: normal  ST/T Wave abnormalities: normal  Conduction Disutrbances: none  Narrative Interpretation:   Old EKG Reviewed: No prior KG available     MDM   1. Facial pain   2. Depression    Ongoing facial pain for several months.  This is not a presentation of ACS.  Discharge home with primary care  followup.  She does have depression and therefore recommended that she see a psychiatrist for outpatient followup.  No suicidal or homicidal thoughts.  I recommended that she decrease her alcohol intake    Lyanne Co, MD 01/05/13 [phone]

## 2013-01-05 NOTE — ED Notes (Signed)
Pt is tearful and reports that she is stressed from taking care of her significant other.

## 2013-02-10 ENCOUNTER — Ambulatory Visit: Payer: Self-pay

## 2013-05-05 ENCOUNTER — Emergency Department: Payer: Self-pay | Admitting: Internal Medicine

## 2013-05-05 LAB — CBC WITH DIFFERENTIAL/PLATELET
Basophil #: 0.2 10*3/uL — ABNORMAL HIGH (ref 0.0–0.1)
Basophil %: 1.4 %
EOS PCT: 2.9 %
Eosinophil #: 0.3 10*3/uL (ref 0.0–0.7)
HCT: 43.4 % (ref 35.0–47.0)
HGB: 14.2 g/dL (ref 12.0–16.0)
LYMPHS PCT: 33.5 %
Lymphocyte #: 3.9 10*3/uL — ABNORMAL HIGH (ref 1.0–3.6)
MCH: 28.5 pg (ref 26.0–34.0)
MCHC: 32.6 g/dL (ref 32.0–36.0)
MCV: 87 fL (ref 80–100)
MONO ABS: 0.8 x10 3/mm (ref 0.2–0.9)
MONOS PCT: 7.1 %
Neutrophil #: 6.4 10*3/uL (ref 1.4–6.5)
Neutrophil %: 55.1 %
PLATELETS: 276 10*3/uL (ref 150–440)
RBC: 4.98 10*6/uL (ref 3.80–5.20)
RDW: 15.1 % — ABNORMAL HIGH (ref 11.5–14.5)
WBC: 11.5 10*3/uL — ABNORMAL HIGH (ref 3.6–11.0)

## 2013-05-05 LAB — BASIC METABOLIC PANEL
Anion Gap: 6 — ABNORMAL LOW (ref 7–16)
BUN: 12 mg/dL (ref 7–18)
Calcium, Total: 10 mg/dL (ref 8.5–10.1)
Chloride: 106 mmol/L (ref 98–107)
Co2: 25 mmol/L (ref 21–32)
Creatinine: 0.43 mg/dL — ABNORMAL LOW (ref 0.60–1.30)
EGFR (African American): >60
EGFR (Non-African Amer.): >60
GLUCOSE: 125 mg/dL — AB (ref 65–99)
OSMOLALITY: 275 (ref 275–301)
Potassium: 4.6 mmol/L (ref 3.5–5.1)
SODIUM: 137 mmol/L (ref 136–145)

## 2013-05-05 LAB — TROPONIN I

## 2013-07-22 ENCOUNTER — Emergency Department: Payer: Self-pay | Admitting: Emergency Medicine

## 2013-12-02 LAB — DRUG SCREEN, URINE

## 2013-12-02 LAB — COMPREHENSIVE METABOLIC PANEL
ALT: 38 U/L
AST: 27 U/L (ref 15–37)
Albumin: 3.6 g/dL (ref 3.4–5.0)
Alkaline Phosphatase: 76 U/L
Anion Gap: 8 (ref 7–16)
BUN: 6 mg/dL — ABNORMAL LOW (ref 7–18)
Bilirubin,Total: 0.3 mg/dL (ref 0.2–1.0)
Calcium, Total: 8.9 mg/dL (ref 8.5–10.1)
Chloride: 106 mmol/L (ref 98–107)
Co2: 25 mmol/L (ref 21–32)
Creatinine: 0.63 mg/dL (ref 0.60–1.30)
EGFR (Non-African Amer.): >60
GLUCOSE: 235 mg/dL — AB (ref 65–99)
Osmolality: 283 (ref 275–301)
Potassium: 3.4 mmol/L — ABNORMAL LOW (ref 3.5–5.1)
Sodium: 139 mmol/L (ref 136–145)
TOTAL PROTEIN: 7.5 g/dL (ref 6.4–8.2)

## 2013-12-02 LAB — CBC
HCT: 40.8 % (ref 35.0–47.0)
HGB: 13.4 g/dL (ref 12.0–16.0)
MCH: 28.8 pg (ref 26.0–34.0)
MCHC: 32.8 g/dL (ref 32.0–36.0)
MCV: 88 fL (ref 80–100)
PLATELETS: 322 10*3/uL (ref 150–440)
RBC: 4.65 10*6/uL (ref 3.80–5.20)
RDW: 14.4 % (ref 11.5–14.5)
WBC: 9.6 10*3/uL (ref 3.6–11.0)

## 2013-12-02 LAB — ETHANOL: Ethanol: <3 mg/dL

## 2013-12-02 LAB — ACETAMINOPHEN LEVEL

## 2013-12-02 LAB — SALICYLATE LEVEL: SALICYLATES, SERUM: 1.8 mg/dL

## 2013-12-02 LAB — TSH: Thyroid Stimulating Horm: 1.27 u[IU]/mL

## 2013-12-03 ENCOUNTER — Inpatient Hospital Stay: Payer: Self-pay | Admitting: Psychiatry

## 2013-12-03 LAB — URINALYSIS, COMPLETE
BILIRUBIN, UR: NEGATIVE
BLOOD: NEGATIVE
Bacteria: NONE SEEN
Glucose,UR: >=500 mg/dL (ref 0–75)
KETONE: NEGATIVE
Leukocyte Esterase: NEGATIVE
NITRITE: NEGATIVE
PH: 6 (ref 4.5–8.0)
PROTEIN: NEGATIVE
SPECIFIC GRAVITY: 1.025 (ref 1.003–1.030)
SQUAMOUS EPITHELIAL: NONE SEEN
WBC UR: 1 /HPF (ref 0–5)

## 2014-06-06 ENCOUNTER — Ambulatory Visit: Payer: Self-pay

## 2014-06-09 ENCOUNTER — Ambulatory Visit: Payer: Self-pay | Admitting: Family Medicine

## 2014-07-16 NOTE — Consult Note (Signed)
PATIENT NAME:  [name], [name] MR#:  [id number] DATE OF BIRTH:  20-Dec-1960  DATE OF CONSULTATION:  12/03/2013   CONSULTING PHYSICIAN:  Erika Amel, MD  IDENTIFYING INFORMATION AND REASON FOR CONSULTATION:  A 54 year old woman presented to the Emergency Room under petition from [facility] with a chief complaint of suicidality.  Consultation for safety and appropriate disposition.   HISTORY OF PRESENT ILLNESS: Information obtained from the patient and the chart.  The patient tells me that she has been feeling depressed for a long time.  Long-standing, like many years of depression. Has not been getting any treatment in years.  Recently things have been getting worse.  Over the last couple of weeks, she has been having uncontrolled crying spells. She feels sad and down all of the time.  Feels hopeless about her situation.  Sleep is very poor at night.  Appetite is poor.  She started to have fantasies about killing herself by overdosing or cutting herself and having dreams about committing suicide.  She occasionally has auditory hallucinations by her report, hearing voices around her.  She has not made any changes to any medications she is taking.  Denies that she is abusing any drugs or alcohol.  Major stress is her isolation and loneliness.  She lives with her adult daughter, but feels like her daughter does not like her and treats her poorly.  Apparently recently, her daughter used some unkind language towards her, which really escalated her depression.  The patient's partner died about a year ago and she is still grieving that.  She is not currently taking any medication for depression.   PAST PSYCHIATRIC HISTORY: Denies any history of hospitalization.  Says many years ago she had a suicide attempt, but never told anyone about it.  Apparently, she did get some treatment from a doctor in [place] at one point.  She remembers the names of Xanax and Zoloft. Thinks that medicines were partially helpful.   Does not remember any other details of it.  Never been in a psychiatric hospital, has not been getting any treatment recently.  Her son just took her to [facility] today for the first time.   PAST MEDICAL HISTORY: She reports that she has fibromyalgia and also evidently has diabetes and high blood pressure.   SOCIAL HISTORY: Lives with her daughter and grandchildren.  The patient does not work.   Spends most of her day doing nothing.  She has another son who lives in the area who she visits at times.   FAMILY HISTORY: Positive for several people with depression.   SUBSTANCE ABUSE HISTORY:  The patient denies that she drinks or abuses any drugs. Says she does not have a past history of alcohol or drug abuse.   CURRENT MEDICATIONS: Glipizide 10 mg once a day, simvastatin 20 mg at night, metformin 1000 mg twice a day, lisinopril 2.5 mg a day, docusate b.i.d. p.r.n. for constipation. The patient told me that she took something for fibromyalgia and wanted to take it again but there is no record here in anything that I can see of her being on any medicine and she has no record of being prescribed any narcotics.   ALLERGIES: BENADRYL AND BEXTRA.   MENTAL STATUS EXAMINATION: Slightly disheveled woman, looks her stated age, cooperative with the interview.  Eye contact good.  Psychomotor activity slow and sluggish. Speech decreased in total amount and quiet.  Affect blunted and tearful.  Mood stated as depressed. Thoughts appear to be mostly  lucid.  She endorses suicidal ideation with thoughts about cutting herself or overdosing.  She denies homicidal ideation, although she says at times she gets so angry she wants to hit her daughter.  She endorses occasional auditory hallucinations, no visual hallucinations.  The patient could recall 3/3 objects immediately, 2/3 at 5 minutes. She is alert and oriented x 4. Judgment and insight appear to be adequate.   VITAL SIGNS: Most recent blood pressure 132/78, respirations  20, pulse 96, temperature 99.4.   LABORATORY RESULTS: Drug screen negative. TSH normal at 1.27.  CBC normal.  Alcohol negative.  Glucoses are running high at 235.  Potassium low at 3.4.   ASSESSMENT: This is a 54 year old woman with a history of major depression describing severe major depressive symptoms, possibly with psychotic features. Does not have a past history of mania.  Major stresses in her life.  Recent suicidal ideation, past history of suicidal behavior. Multiple risk factors and needs hospitalization.   TREATMENT PLAN: Admit to psychiatry.  Suicide precautions and elopement and close precautions.  I am going to choose to give her trazodone 100 mg at night to help with her sleep and Celexa 20 mg a day.  Primary team on the unit may decide to make changes to treatment of depression. Current medications continued.  We can order blood sugar checks and adjust as needed.  Education done with the patient and she agrees to the plan.   DIAGNOSIS, PRINCIPAL AND PRIMARY:  AXIS I: Major depression, severe, recurrent.  SECONDARY DIAGNOSES:  AXIS I: No further.  AXIS II: Deferred.  AXIS III: High blood pressure, diabetes, fibromyalgia.     ____________________________ Erika AmelJohn T. Terreon Ekholm, MD jtc:DT D: 12/03/2013 16:20:30 ET T: 12/03/2013 16:35:42 ET JOB#: [id number]  cc: Erika AmelJohn T. Khali Perella, MD, <Dictator> Erika AmelJOHN T Vibha Ferdig MD ELECTRONICALLY SIGNED 12/04/2013 22:25

## 2014-07-16 NOTE — H&P (Signed)
PATIENT NAME:  [name], HAND MR#:  [id number] DATE OF BIRTH:  Aug 30, 1960  DATE OF ADMISSION:  12/02/2013  REFERRING PHYSICIAN: Dr. Phineas Semen.  ATTENDING PHYSICIAN: Dr. Kristine Linea.   IDENTIFYING DATA: [name] is a 54 year old female with history of depression.   CHIEF COMPLAINT: "I'm suicidal."  HISTORY OF PRESENT ILLNESS: [name] has a history of depression. She had been treated with Zoloft and Xanax several years ago while living in [place]. For the past several years she has not been seeing a psychiatrist or taking any antidepressants. One year ago, her partner died and she was forced to move in with her daughter. This is a very bad arrangement. The daughter is unkind and increasingly annoyed with her mother. She was really unpleasant and harsh and the patient became more and more depressed and suicidal. She told her primary doctor about it. He sent her to [facility]. At [facility], she was evaluated and it was decided that she needs hospitalization as the patient was actively suicidal wanting to overdose on her pills. She endorses many symptoms of depression with poor sleep, decreased appetite, anhedonia, feeling of guilt, hopelessness, worthlessness, poor memory and concentration, low energy, social isolation, crying spells, and now suicidal ideation. She endorses some anxiety but there are no panic attacks or PTSD-type of symptoms. She denies any psychotic symptoms. She denies symptoms suggestive of bipolar mania. There is no alcohol or illicit drug abuse.   PAST PSYCHIATRIC HISTORY: She has never been hospitalized. She attempted a suicide by overdose many years ago for which she did not go to the hospital and she did not tell anybody. There was a period of time when she took Zoloft and Xanax. She is ready for psychiatric treatment now.   FAMILY PSYCHIATRIC HISTORY: Her mother and daughter suffer depression.  PAST MEDICAL HISTORY: Diabetes, hypertension,  dyslipidemia, fibromyalgia.   ALLERGIES: BENADRYL AND BEXTRA.   MEDICATIONS ON ADMISSION: Zocor 20 mg at bedtime, Glucotrol 10 mg with breakfast, metformin 1000 mg twice daily, lisinopril 2.5 mg daily, Colace 100 mg twice daily.   SOCIAL HISTORY: As above, she lives with her daughter now. She tried to apply for income-based housing but did not complete her applications as it turns out. She has no transportation and it is very difficult for her to move about. She has $100 a month income from child support that would be getting "for a long time". She also has food stamps. She was better off while her partner was alive.   REVIEW OF SYSTEMS:   CONSTITUTIONAL: No fevers or chills. No weight changes.  EYES: No double or blurred vision.  ENT: No hearing loss.  RESPIRATORY: No shortness of breath or cough.  CARDIOVASCULAR: No chest pain or orthopnea.  GASTROINTESTINAL: No abdominal pain, nausea, vomiting, or diarrhea.  GENITOURINARY: No incontinence or frequency.  ENDOCRINE: No heat or cold intolerance.  LYMPHATIC: No anemia or easy bruising.  INTEGUMENTARY: No acne or rash.  MUSCULOSKELETAL: Positive for fibromyalgia.  NEUROLOGIC: No tingling or weakness.  PSYCHIATRIC: See history of present illness for details.   PHYSICAL EXAMINATION:  VITAL SIGNS: Blood pressure 132/78, pulse 96, respirations 20, temperature 99.4. GENERAL: This is a slender, middle-aged female in no acute distress.  HEENT: The pupils are equal, round, and reactive to light. Sclerae are anicteric.  NECK: Supple. No thyromegaly.  LUNGS: Clear to auscultation. No dullness to percussion.  HEART: Regular rhythm and rate. No murmurs, rubs, or gallops.  ABDOMEN: Soft, nontender, nondistended. Positive bowel sounds.  MUSCULOSKELETAL: Normal muscle strength in all extremities.  SKIN: No rashes or bruises.  LYMPHATIC: No cervical adenopathy.  NEUROLOGIC: Cranial nerves II through XII are intact.   LABORATORY DATA: Chemistries  are within normal limits except for blood glucose of 235 and potassium 3.4. Alcohol level is zero. LFTs within normal limits. TSH 1.27. Urine tox screen negative for substances. CBC within normal limits. Serum acetaminophen and salicylate are low.   MENTAL STATUS EXAMINATION ON ADMISSION: The patient is alert and oriented to person, place, time, and situation. She is pleasant, polite, and cooperative. She is well groomed, wearing hospital scrubs. She maintains good eye contact. Her speech is soft. Mood is depressed with flat affect. Thought process is logical and goal oriented. Thought content: She endorses suicidal ideation with a plan to overdose. There are no thoughts of hurting others. There are no delusions or paranoia. There are no auditory or visual hallucinations. Her cognition is grossly intact. Registration, recall, and long-term memory are intact. She is of average intelligence and fund of knowledge. Her insight and judgment are good. Suicide risk assessment on admission: This is a patient with a long history of untreated depression and suicide attempt in the past who came to the hospital suicidal in the context of severe social stressors. She is at increased risk of suicide.   INITIAL DIAGNOSES:   AXIS I: Major depressive disorder, recurrent, severe.   AXIS II: Deferred.   AXIS III: Hypertension, diabetes, dyslipidemia, fibromyalgia.   AXIS IV: Mental illness, treatment compliance, access to care, family conflict.   AXIS V: Global assessment of functioning 25.   PLAN: The patient was admitted to [facility] for safety, stabilization, and medication management. She was initially placed on suicide precautions and was closely monitored for any unsafe behaviors. She underwent full psychiatric and risk assessment. She received pharmacotherapy, individual and group psychotherapy, substance abuse counseling, and support from therapeutic milieu.  1.  Suicidal ideation: The patient is able to contract for safety in the hospital. 2. Mood: She was started on Celexa.  3. Medical. We will continue all medications for diabetes, dyslipidemia, and hypertension. She is on a diabetic diet with blood glucose checks.  4. Fibromyalgia. The patient was prescribed something by her primary physician, but she is uncertain of the name of the medication. She knows they were not narcotics. She takes advantage of a free pharmacy and, therefore, not every medication is available through them. We will start Neurontin.   DISPOSITION: Unfortunately, she will be discharged to her daughter. She will follow up with [facility]. We will ask Lorella NimrodHarvey to evaluate this patient to see whether or not she would qualify for community support team. She also could apply for [facility] free pharmacy at the hospital.    ____________________________ Ellin GoodieJolanta B. Jennet MaduroPucilowska, MD jbp:lt D: 12/03/2013 18:46:00 ET T: 12/03/2013 20:09:45 ET JOB#: [id number]  cc: Aristotle Lieb B. Jennet MaduroPucilowska, MD, <Dictator>  Shari ProwsJOLANTA B Dany Harten MD ELECTRONICALLY SIGNED 12/20/2013 23:14 Shari ProwsJOLANTA B Nyheem Binette MD ELECTRONICALLY SIGNED 12/20/2013 23:23

## 2014-11-23 ENCOUNTER — Ambulatory Visit: Payer: Self-pay | Admitting: Internal Medicine

## 2014-12-13 ENCOUNTER — Ambulatory Visit: Payer: Self-pay

## 2014-12-22 ENCOUNTER — Ambulatory Visit: Payer: Self-pay | Admitting: Ophthalmology

## 2014-12-30 ENCOUNTER — Emergency Department
Admission: EM | Admit: 2014-12-30 | Discharge: 2014-12-30 | Disposition: A | Discharge status: Home / Self Care | Payer: Self-pay | Attending: Emergency Medicine | Admitting: Emergency Medicine

## 2014-12-30 ENCOUNTER — Emergency Department: Payer: Self-pay

## 2014-12-30 ENCOUNTER — Encounter: Payer: Self-pay | Admitting: *Deleted

## 2014-12-30 DIAGNOSIS — F419 Anxiety disorder, unspecified: Secondary | ICD-10-CM | POA: Insufficient documentation

## 2014-12-30 DIAGNOSIS — Z720 Tobacco use: Secondary | ICD-10-CM | POA: Insufficient documentation

## 2014-12-30 DIAGNOSIS — R110 Nausea: Secondary | ICD-10-CM | POA: Insufficient documentation

## 2014-12-30 DIAGNOSIS — R079 Chest pain, unspecified: Secondary | ICD-10-CM | POA: Insufficient documentation

## 2014-12-30 DIAGNOSIS — E119 Type 2 diabetes mellitus without complications: Secondary | ICD-10-CM | POA: Insufficient documentation

## 2014-12-30 DIAGNOSIS — J45901 Unspecified asthma with (acute) exacerbation: Secondary | ICD-10-CM | POA: Insufficient documentation

## 2014-12-30 LAB — BASIC METABOLIC PANEL
ANION GAP: 9 (ref 5–15)
BUN: 10 mg/dL (ref 6–20)
CALCIUM: 9.8 mg/dL (ref 8.9–10.3)
CO2: 23 mmol/L (ref 22–32)
Chloride: 107 mmol/L (ref 101–111)
Creatinine, Ser: 0.81 mg/dL (ref 0.44–1.00)
GFR calc Af Amer: >60 mL/min (ref >60)
GFR calc non Af Amer: >60 mL/min (ref >60)
GLUCOSE: 101 mg/dL — AB (ref 65–99)
Potassium: 4.1 mmol/L (ref 3.5–5.1)
Sodium: 139 mmol/L (ref 135–145)

## 2014-12-30 LAB — CBC
HCT: 42.1 % (ref 35.0–47.0)
HEMOGLOBIN: 13.3 g/dL (ref 12.0–16.0)
MCH: 27 pg (ref 26.0–34.0)
MCHC: 31.5 g/dL — AB (ref 32.0–36.0)
MCV: 85.8 fL (ref 80.0–100.0)
Platelets: 265 10*3/uL (ref 150–440)
RBC: 4.91 MIL/uL (ref 3.80–5.20)
RDW: 14.8 % — ABNORMAL HIGH (ref 11.5–14.5)
WBC: 12.5 10*3/uL — ABNORMAL HIGH (ref 3.6–11.0)

## 2014-12-30 LAB — GLUCOSE, CAPILLARY: Glucose-Capillary: 99 mg/dL (ref 65–99)

## 2014-12-30 LAB — TROPONIN I: Troponin I: <0.03 ng/mL (ref <0.031)

## 2014-12-30 MED ORDER — HYDROXYZINE HCL 10 MG PO TABS: 10.0000 mg | ORAL_TABLET | Freq: Four times a day (QID) | ORAL | Status: DC | PRN

## 2014-12-30 NOTE — Discharge Instructions (Signed)
As we discussed please follow-up with her primary care physician regarding any recent increase in anxiety. Please follow-up with cardiology by calling the number provided to arrange an outpatient stress test as soon as possible. Please return to the emergency department for any worsening chest pain, trouble breathing, or any other symptom personally concerning to yourself.   Nonspecific Chest Pain It is often hard to find the cause of chest pain. There is always a chance that your pain could be related to something serious, such as a heart attack or a blood clot in your lungs. Chest pain can also be caused by conditions that are not life-threatening. If you have chest pain, it is very important to follow up with your doctor.  HOME CARE  If you were prescribed an antibiotic medicine, finish it all even if you start to feel better.  Avoid any activities that cause chest pain.  Do not use any tobacco products, including cigarettes, chewing tobacco, or electronic cigarettes. If you need help quitting, ask your doctor.  Do not drink alcohol.  Take medicines only as told by your doctor.  Keep all follow-up visits as told by your doctor. This is important. This includes any further testing if your chest pain does not go away.  Your doctor may tell you to keep your head raised (elevated) while you sleep.  Make lifestyle changes as told by your doctor. These may include:  Getting regular exercise. Ask your doctor to suggest some activities that are safe for you.  Eating a heart-healthy diet. Your doctor or a diet specialist (dietitian) can help you to learn healthy eating options.  Maintaining a healthy weight.  Managing diabetes, if necessary.  Reducing stress. GET HELP IF:  Your chest pain does not go away, even after treatment.  You have a rash with blisters on your chest.  You have a fever. GET HELP RIGHT AWAY IF:  Your chest pain is worse.  You have an increasing cough, or you  cough up blood.  You have severe belly (abdominal) pain.  You feel extremely weak.  You pass out (faint).  You have chills.  You have sudden, unexplained chest discomfort.  You have sudden, unexplained discomfort in your arms, back, neck, or jaw.  You have shortness of breath at any time.  You suddenly start to sweat, or your skin gets clammy.  You feel nauseous.  You vomit.  You suddenly feel light-headed or dizzy.  Your heart begins to beat quickly, or it feels like it is skipping beats. These symptoms may be an emergency. Do not wait to see if the symptoms will go away. Get medical help right away. Call your local emergency services (911 in the [place] Do not drive yourself to the hospital.   This information is not intended to replace advice given to you by your health care provider. Make sure you discuss any questions you have with your health care provider.   Document Released: 08/28/2007 Document Revised: 04/01/2014 Document Reviewed: 10/15/2013 Elsevier Interactive Patient Education [employer]

## 2014-12-30 NOTE — ED Provider Notes (Signed)
[facility] Emergency Department Provider Note  Time seen: 4:36 PM  I have reviewed the triage vital signs and the nursing notes.   HISTORY  Chief Complaint Chest Pain    HPI RUMOR SUN is a 54 y.o. female with a past medical history of asthma, diabetes, fibromyalgia, presents the emergency department with left-sided chest pain. According to the patient for the past 3 days she's had intermittent shortness of breath and left-sided chest discomfort. She also states for the last 3 days she has intermittently become tearful and anxious feeling. She states a history of anxiety and feels as if this is a panic attack. Denies any discomfort currently, as she states the pain comes and goes. States occasional shortness of breath with symptoms. Denies any pleuritic chest pain. States she has been intermittently nauseated for the past several weeks but denies any vomiting, diarrhea, fever, dysuria. Describes her chest pain is moderate when it occurs, gone currently.   Past Medical History  Diagnosis Date  . Asthma   . Diabetes mellitus   . GERD (gastroesophageal reflux disease)   . Fibromyalgia   . Fibromyalgia     There are no active problems to display for this patient.   Past Surgical History  Procedure Laterality Date  . Abdominal surgery    . Abdominal hysterectomy      Current Outpatient Rx  Name  Route  Sig  Dispense  Refill  . ibuprofen (ADVIL,MOTRIN) 200 MG tablet   Oral   Take 800 mg by mouth every 6 (six) hours as needed. For pain         . ibuprofen (ADVIL,MOTRIN) 600 MG tablet   Oral   Take 1 tablet (600 mg total) by mouth every 8 (eight) hours as needed for pain.   15 tablet   0     Allergies Benadryl and Bextra  Family History  Problem Relation Age of Onset  . Diabetes Mother   . Hypertension Mother     Social History Social History  Substance Use Topics  . Smoking status: Current Every Day Smoker -- 0.50 packs/day    Types: Cigarettes  . Smokeless tobacco: Current User  . Alcohol Use: No     Comment: stopped 1 week ago, social drinker    Review of Systems Constitutional: Negative for fever. Cardiovascular: Positive for intermittent left chest discomfort Respiratory: Positive for intermittent shortness of breath Gastrointestinal: Negative for abdominal pain, vomiting and diarrhea. Positive for intermittent nausea Neurological: Negative for headaches, focal weakness or numbness. 10-point ROS otherwise negative.  ____________________________________________   PHYSICAL EXAM:  VITAL SIGNS: ED Triage Vitals  Enc Vitals Group     BP 12/30/14 1451 110/70 mmHg     Pulse Rate 12/30/14 1451 86     Resp 12/30/14 1451 18     Temp 12/30/14 1451 98.2 F (36.8 C)     Temp Source 12/30/14 1451 Oral     SpO2 12/30/14 1451 93 %     Weight 12/30/14 1451 142 lb (64.411 kg)     Height 12/30/14 1451  (1.499 m)     Head Cir --      Peak Flow --      Pain Score 12/30/14 1455 9     Pain Loc --      Pain Edu? --      Excl. in GC? --     Constitutional: Alert and oriented. Well appearing and in no distress. Eyes: Normal exam ENT  Head: Normocephalic and atraumatic.   Mouth/Throat: Mucous membranes are moist. Cardiovascular: Normal rate, regular rhythm. No murmur Respiratory: Normal respiratory effort without tachypnea nor retractions. Breath sounds are clear and equal bilaterally. No wheezes/rales/rhonchi. Moderate sternal tenderness to palpation. Gastrointestinal: Soft and nontender. No distention.   Musculoskeletal: Nontender with normal range of motion in all extremities. No lower extremity tenderness or edema. Neurologic:  Normal speech and language. No gross focal neurologic deficits Psychiatric: Mood and affect are normal. Speech and behavior are normal. Patient exhibits appropriate insight and judgment.  ____________________________________________    EKG  EKG reviewed and  interpreted by myself shows normal sinus rhythm at 84 bpm, narrow QRS, normal axis, normal intervals, no ST changes noted. Overall normal EKG.  ____________________________________________    RADIOLOGY  Chest x-ray shows no acute abnormalities. Chronic right basilar atelectasis.  ____________________________________________    INITIAL IMPRESSION / ASSESSMENT AND PLAN / ED COURSE  Pertinent labs & imaging results that were available during my care of the patient were reviewed by me and considered in my medical decision making (see chart for details).  Patient with 3 days of intermittent chest pain, anxiety and tearful at times. Patient states a history of anxiety which she states this feels similar. Patient's labs are within normal limits including negative troponin. Chest x-ray shows no acute changes. EKG appears well. Patient does have a slight bleed decreased oxygen saturation currently 96-98% but she does have a significant smoking history as well. Denies any current shortness of breath, chest pain, or pleuritic chest pain on exam. No lower extremity tenderness or edema. Normal pulse rate. Do not suspect DVT clinically. Patient's chest pain is moderately reproducible. I discussed with the patient need to follow up with cardiology for a stress test. Patient is agreeable to plan. She will also follow-up with her primary care physician regarding her recent increase in anxiety.  ____________________________________________   FINAL CLINICAL IMPRESSION(S) / ED DIAGNOSES  Chest pain   Minna Antis, MD 12/30/14 [phone]

## 2014-12-30 NOTE — ED Notes (Signed)
Pt states left sided chest soreness, states it feels like an anxiety attack, pt on antianxiety medication

## 2014-12-30 NOTE — ED Notes (Signed)
Pt reports heaviness in chest x 3 days. Reports shortness of breath and dizziness as well.

## 2015-01-25 ENCOUNTER — Ambulatory Visit: Payer: Self-pay | Admitting: Internal Medicine

## 2015-02-01 ENCOUNTER — Ambulatory Visit: Payer: Self-pay | Admitting: Internal Medicine

## 2015-02-08 ENCOUNTER — Ambulatory Visit: Payer: Self-pay | Admitting: Internal Medicine

## 2015-02-08 DIAGNOSIS — E119 Type 2 diabetes mellitus without complications: Secondary | ICD-10-CM | POA: Insufficient documentation

## 2015-02-10 ENCOUNTER — Other Ambulatory Visit: Payer: Self-pay | Admitting: Internal Medicine

## 2015-02-10 DIAGNOSIS — R079 Chest pain, unspecified: Secondary | ICD-10-CM

## 2015-02-22 ENCOUNTER — Ambulatory Visit: Payer: Self-pay | Admitting: Internal Medicine

## 2015-03-14 ENCOUNTER — Institutional Professional Consult (permissible substitution): Payer: Self-pay

## 2015-04-19 ENCOUNTER — Ambulatory Visit: Payer: Self-pay | Admitting: Internal Medicine

## 2015-04-19 DIAGNOSIS — M797 Fibromyalgia: Secondary | ICD-10-CM | POA: Insufficient documentation

## 2015-04-19 LAB — BASIC METABOLIC PANEL
BUN: 14 mg/dL (ref 4–21)
Creatinine: 0.7 mg/dL (ref 0.5–1.1)
Glucose: 142 mg/dL
Potassium: 4.6 mmol/L (ref 3.4–5.3)
Sodium: 139 mmol/L (ref 137–147)

## 2015-04-19 LAB — HEPATIC FUNCTION PANEL
ALK PHOS: 74 U/L (ref 25–125)
ALT: 21 U/L (ref 7–35)
AST: 15 U/L (ref 13–35)
BILIRUBIN, TOTAL: 0.2 mg/dL

## 2015-04-19 LAB — CBC AND DIFFERENTIAL
HEMATOCRIT: 42 % (ref 36–46)
HEMOGLOBIN: 14 g/dL (ref 12.0–16.0)
Neutrophils Absolute: 5 /uL
Platelets: 327 10*3/uL (ref 150–399)
WBC: 10.3 10*3/mL

## 2015-04-19 LAB — HEMOGLOBIN A1C: HEMOGLOBIN A1C: 8.1

## 2015-04-26 ENCOUNTER — Ambulatory Visit: Payer: Self-pay | Admitting: Internal Medicine

## 2015-05-15 DIAGNOSIS — I10 Essential (primary) hypertension: Secondary | ICD-10-CM | POA: Insufficient documentation

## 2015-05-15 DIAGNOSIS — E119 Type 2 diabetes mellitus without complications: Secondary | ICD-10-CM

## 2015-05-15 DIAGNOSIS — M797 Fibromyalgia: Secondary | ICD-10-CM

## 2015-05-16 ENCOUNTER — Ambulatory Visit
Admission: RE | Admit: 2015-05-16 | Discharge: 2015-05-16 | Disposition: A | Discharge status: Home / Self Care | Payer: Self-pay | Source: Ambulatory Visit | Attending: Internal Medicine | Admitting: Internal Medicine

## 2015-05-16 DIAGNOSIS — R079 Chest pain, unspecified: Secondary | ICD-10-CM

## 2015-05-16 DIAGNOSIS — J984 Other disorders of lung: Secondary | ICD-10-CM | POA: Insufficient documentation

## 2015-05-24 ENCOUNTER — Telehealth: Payer: Self-pay

## 2015-05-24 NOTE — Telephone Encounter (Signed)
Need to reschedule 06/07/15 appointment bc Dr Candelaria Stagers will not be here that day

## 2015-05-25 NOTE — Telephone Encounter (Signed)
Called pt to cancel 3/15 apt and reschedule for 3/22 at 11:45. Reminded pt of 3/8 labs

## 2015-05-31 ENCOUNTER — Other Ambulatory Visit: Payer: Self-pay

## 2015-06-01 ENCOUNTER — Telehealth: Payer: Self-pay

## 2015-06-01 NOTE — Telephone Encounter (Signed)
Crystal, from [facility], received urine specimen with no name. I filled out and signed authorization form and faxed to [facility]. [facility] will now be able to perform  UA

## 2015-06-02 LAB — CBC WITH DIFFERENTIAL/PLATELET
Basophils Absolute: 0.1 10*3/uL (ref 0.0–0.2)
Basos: 1 %
EOS (ABSOLUTE): 0.3 10*3/uL (ref 0.0–0.4)
Eos: 2 %
HEMATOCRIT: 40.1 % (ref 34.0–46.6)
HEMOGLOBIN: 12.6 g/dL (ref 11.1–15.9)
IMMATURE GRANS (ABS): 0 10*3/uL (ref 0.0–0.1)
IMMATURE GRANULOCYTES: 0 %
LYMPHS ABS: 5 10*3/uL — AB (ref 0.7–3.1)
Lymphs: 44 %
MCH: 26.5 pg — ABNORMAL LOW (ref 26.6–33.0)
MCHC: 31.4 g/dL — ABNORMAL LOW (ref 31.5–35.7)
MCV: 84 fL (ref 79–97)
MONOS ABS: 0.8 10*3/uL (ref 0.1–0.9)
Monocytes: 8 %
NEUTROS PCT: 45 %
Neutrophils Absolute: 5 10*3/uL (ref 1.4–7.0)
Platelets: 302 10*3/uL (ref 150–379)
RBC: 4.76 x10E6/uL (ref 3.77–5.28)
RDW: 15.3 % (ref 12.3–15.4)
WBC: 11.1 10*3/uL — AB (ref 3.4–10.8)

## 2015-06-02 LAB — COMPREHENSIVE METABOLIC PANEL
A/G RATIO: 1.6 (ref 1.1–2.5)
ALK PHOS: 65 IU/L (ref 39–117)
ALT: 16 IU/L (ref 0–32)
AST: 15 IU/L (ref 0–40)
Albumin: 4.2 g/dL (ref 3.5–5.5)
BILIRUBIN TOTAL: 0.2 mg/dL (ref 0.0–1.2)
BUN / CREAT RATIO: 13 (ref 9–23)
BUN: 9 mg/dL (ref 6–24)
CO2: 20 mmol/L (ref 18–29)
Calcium: 9.2 mg/dL (ref 8.7–10.2)
Chloride: 104 mmol/L (ref 96–106)
Creatinine, Ser: 0.67 mg/dL (ref 0.57–1.00)
GFR calc Af Amer: 115 mL/min/{1.73_m2} (ref >59)
GFR calc non Af Amer: 100 mL/min/{1.73_m2} (ref >59)
GLOBULIN, TOTAL: 2.6 g/dL (ref 1.5–4.5)
Glucose: 63 mg/dL — ABNORMAL LOW (ref 65–99)
POTASSIUM: 4.2 mmol/L (ref 3.5–5.2)
SODIUM: 142 mmol/L (ref 134–144)
Total Protein: 6.8 g/dL (ref 6.0–8.5)

## 2015-06-02 LAB — URINALYSIS
Bilirubin, UA: NEGATIVE
Glucose, UA: NEGATIVE
Ketones, UA: NEGATIVE
LEUKOCYTES UA: NEGATIVE
NITRITE UA: NEGATIVE
PROTEIN UA: NEGATIVE
RBC UA: NEGATIVE
SPEC GRAV UA: 1.015 (ref 1.005–1.030)
Urobilinogen, Ur: 0.2 mg/dL (ref 0.2–1.0)
pH, UA: 5.5 (ref 5.0–7.5)

## 2015-06-02 LAB — LIPID PANEL
CHOL/HDL RATIO: 3.6 ratio (ref 0.0–4.4)
Cholesterol, Total: 108 mg/dL (ref 100–199)
HDL: 30 mg/dL — AB (ref >39)
LDL Calculated: 47 mg/dL (ref 0–99)
TRIGLYCERIDES: 156 mg/dL — AB (ref 0–149)
VLDL CHOLESTEROL CAL: 31 mg/dL (ref 5–40)

## 2015-06-02 LAB — HEMOGLOBIN A1C
Est. average glucose Bld gHb Est-mCnc: 183 mg/dL
HEMOGLOBIN A1C: 8 % — AB (ref 4.8–5.6)

## 2015-06-02 LAB — SPECIMEN STATUS REPORT

## 2015-06-07 ENCOUNTER — Ambulatory Visit: Payer: Self-pay | Admitting: Internal Medicine

## 2015-06-14 ENCOUNTER — Ambulatory Visit: Payer: Self-pay | Admitting: Internal Medicine

## 2015-06-14 VITALS — BP 94/67 | HR 85 | Temp 97.8°F | Wt 140.0 lb

## 2015-06-14 DIAGNOSIS — M797 Fibromyalgia: Secondary | ICD-10-CM

## 2015-06-14 DIAGNOSIS — E139 Other specified diabetes mellitus without complications: Secondary | ICD-10-CM

## 2015-06-14 DIAGNOSIS — I10 Essential (primary) hypertension: Secondary | ICD-10-CM

## 2015-06-14 LAB — GLUCOSE, POCT (MANUAL RESULT ENTRY): POC Glucose: 70 mg/dl (ref 70–99)

## 2015-06-14 NOTE — Assessment & Plan Note (Signed)
Advised to take half a tab of Lisinopril per day.

## 2015-06-14 NOTE — Assessment & Plan Note (Addendum)
Stop Glipizide. Keep Januvia and Metformin. Low sugars may be causing nausea. Check sugars once a day, one day in the morning, next day at night.

## 2015-06-14 NOTE — Progress Notes (Signed)
Subjective:    Patient ID: Erika MiniumJanice R Cook, female    DOB: [birth year], 55 y.o.   MRN: [id number]  HPI  Patient Active Problem List   Diagnosis Date Noted  . Essential hypertension 05/15/2015  . Fibromyalgia 04/19/2015  . Diabetes (HCC) 02/08/2015   Pt presents with a f/u for diabetes. Has been checking sugars and they are in the 160s/170s.  Pt presents with a f/u for hypertension. Blood pressure was low this morning (94/67).  Pt presents with a f/u for fibromyalgia. Causing aching.  Pt complains of a sore chest. Across both sides of the chest.   Pt has been taking a medication for nausea which is not on our formulary.   Review of Systems  Cardiovascular: Positive for chest pain.  Gastrointestinal: Positive for nausea.  Psychiatric/Behavioral: Positive for behavioral problems (depression).       Objective:   Physical Exam  Constitutional: She is oriented to person, place, and time.  Cardiovascular: Normal rate, regular rhythm and normal heart sounds.   Pulmonary/Chest: Effort normal and breath sounds normal.  Neurological: She is alert and oriented to person, place, and time.     BP 94/67 mmHg  Pulse 85  Temp(Src) 97.8 F (36.6 C)  Wt 140 lb (63.504 kg)    Medication List       This list is accurate as of: 06/14/15 12:27 PM.  Always use your most recent med list.               aspirin 81 MG tablet  Take 81 mg by mouth daily.     cetirizine 10 MG tablet  Commonly known as:  ZYRTEC  Take 10 mg by mouth at bedtime.     citalopram 40 MG tablet  Commonly known as:  CELEXA  Take 40 mg by mouth daily.     gabapentin 300 MG capsule  Commonly known as:  NEURONTIN  Take 300 mg by mouth 4 (four) times daily.     hydrOXYzine 10 MG tablet  Commonly known as:  ATARAX/VISTARIL  Take 1 tablet (10 mg total) by mouth every 6 (six) hours as needed for anxiety.     lisinopril 20 MG tablet  Commonly known as:  PRINIVIL,ZESTRIL  Take 10 mg by mouth daily. Cut  tab in half     metFORMIN 500 MG tablet  Commonly known as:  GLUCOPHAGE  Take by mouth 2 (two) times daily with a meal.     ranitidine 150 MG tablet  Commonly known as:  ZANTAC  Take 150 mg by mouth 2 (two) times daily.     rosuvastatin 20 MG tablet  Commonly known as:  CRESTOR  Take 20 mg by mouth daily.     sitaGLIPtin 100 MG tablet  Commonly known as:  JANUVIA  Take 100 mg by mouth daily.     VENTOLIN IN  Inhale 1 puff into the lungs 2 (two) times daily.          Assessment & Plan:   Diabetes (HCC) Stop Glipizide. Keep Januvia and Metformin. Low sugars may be causing nausea. Check sugars once a day, one day in the morning, next day at night.  Essential hypertension Advised to take half a tab of Lisinopril per day.   [name] was seen today for diabetes, hypertension, fibromyalgia and depression.  Diagnoses and all orders for this visit:  Diabetes mellitus of other type without complication (HCC) -     POCT glucose (manual entry)  Essential hypertension  Fibromyalgia -     DG Chest 2 View; Future   Pt advised to get medication for nausea at drugstore.   Fibromyalgia scarring on right side of the lung.   Return in 4 weeks.

## 2015-07-12 ENCOUNTER — Ambulatory Visit: Payer: Self-pay | Admitting: Internal Medicine

## 2015-07-12 VITALS — BP 118/82 | HR 88 | Temp 98.1°F | Wt 138.0 lb

## 2015-07-12 DIAGNOSIS — E119 Type 2 diabetes mellitus without complications: Secondary | ICD-10-CM

## 2015-07-12 DIAGNOSIS — M797 Fibromyalgia: Secondary | ICD-10-CM

## 2015-07-12 LAB — GLUCOSE, POCT (MANUAL RESULT ENTRY): POC Glucose: 300 mg/dl — AB (ref 70–99)

## 2015-07-12 NOTE — Assessment & Plan Note (Signed)
Sugars in her log look improved. Next 2 months: check 1 time a day. One day check before breakfast and the next day check before you eat supper and alternate.  No changes in medications.

## 2015-07-12 NOTE — Progress Notes (Signed)
   Subjective:    Patient ID: [name], female    DOB: [birth year], 55 y.o.   MRN: [id number]  HPI  Patient Active Problem List   Diagnosis Date Noted  . Essential hypertension 05/15/2015  . Fibromyalgia 04/19/2015  . Diabetes ([place]) 02/08/2015   Pt presents with a f/u for diabetes. Her blood glucose this morning was 300. Pt did not have breakfast this morning. Glipizide was stopped on the last visit to make sure she wasn't having hypoglycemia attacks. Pt brought in a sugar log and sugars from the log look improved. Pt was drinking coffee with a lot of sugar this morning.   Review of Systems     Objective:   Physical Exam  Constitutional: She is oriented to person, place, and time.  Cardiovascular: Normal rate, regular rhythm and normal heart sounds.   Pulmonary/Chest: Effort normal and breath sounds normal.  Neurological: She is alert and oriented to person, place, and time.      Medication List       This list is accurate as of: 07/12/15 11:46 AM.  Always use your most recent med list.               aspirin 81 MG tablet  Take 81 mg by mouth daily.     cetirizine 10 MG tablet  Commonly known as:  ZYRTEC  Take 10 mg by mouth at bedtime.     citalopram 40 MG tablet  Commonly known as:  CELEXA  Take 40 mg by mouth daily.     gabapentin 300 MG capsule  Commonly known as:  NEURONTIN  Take 300 mg by mouth 4 (four) times daily.     hydrOXYzine 10 MG tablet  Commonly known as:  ATARAX/VISTARIL  Take 1 tablet (10 mg total) by mouth every 6 (six) hours as needed for anxiety.     lisinopril 20 MG tablet  Commonly known as:  PRINIVIL,ZESTRIL  Take 10 mg by mouth daily. Cut tab in half     metFORMIN 500 MG tablet  Commonly known as:  GLUCOPHAGE  Take by mouth 2 (two) times daily with a meal.     ranitidine 150 MG tablet  Commonly known as:  ZANTAC  Take 150 mg by mouth 2 (two) times daily.     rosuvastatin 20 MG tablet  Commonly known as:  CRESTOR  Take  20 mg by mouth daily.     sitaGLIPtin 100 MG tablet  Commonly known as:  JANUVIA  Take 100 mg by mouth daily.     VENTOLIN IN  Inhale 1 puff into the lungs 2 (two) times daily.        BP 118/82 mmHg  Pulse 88  Temp(Src) 98.1 F (36.7 C)  Wt 138 lb (62.596 kg)     Assessment & Plan:  Diabetes ([place]) Sugars in her log look improved. Next 2 months: check 1 time a day. One day check before breakfast and the next day check before you eat supper and alternate.  No changes in medications.    Pt is experiencing pain that is most likely due to fibromyalgia. Pt is taking Gabapentin twice a day, two pills each time for fibromyalgia. Fibromyalgia does better in the spring and summer months.   Md f/u:2 months Labs: A1c, met c, cbc, lipid

## 2015-09-07 ENCOUNTER — Emergency Department
Admission: EM | Admit: 2015-09-07 | Discharge: 2015-09-07 | Disposition: A | Discharge status: Home / Self Care | Payer: Self-pay | Attending: Emergency Medicine | Admitting: Emergency Medicine

## 2015-09-07 DIAGNOSIS — K089 Disorder of teeth and supporting structures, unspecified: Secondary | ICD-10-CM

## 2015-09-07 DIAGNOSIS — G8929 Other chronic pain: Secondary | ICD-10-CM | POA: Insufficient documentation

## 2015-09-07 DIAGNOSIS — M545 Low back pain, unspecified: Secondary | ICD-10-CM

## 2015-09-07 DIAGNOSIS — J45909 Unspecified asthma, uncomplicated: Secondary | ICD-10-CM | POA: Insufficient documentation

## 2015-09-07 DIAGNOSIS — I10 Essential (primary) hypertension: Secondary | ICD-10-CM | POA: Insufficient documentation

## 2015-09-07 DIAGNOSIS — K0889 Other specified disorders of teeth and supporting structures: Secondary | ICD-10-CM | POA: Insufficient documentation

## 2015-09-07 DIAGNOSIS — Z7982 Long term (current) use of aspirin: Secondary | ICD-10-CM | POA: Insufficient documentation

## 2015-09-07 DIAGNOSIS — F17210 Nicotine dependence, cigarettes, uncomplicated: Secondary | ICD-10-CM | POA: Insufficient documentation

## 2015-09-07 DIAGNOSIS — E119 Type 2 diabetes mellitus without complications: Secondary | ICD-10-CM | POA: Insufficient documentation

## 2015-09-07 DIAGNOSIS — Z79899 Other long term (current) drug therapy: Secondary | ICD-10-CM | POA: Insufficient documentation

## 2015-09-07 DIAGNOSIS — R110 Nausea: Secondary | ICD-10-CM | POA: Insufficient documentation

## 2015-09-07 LAB — COMPREHENSIVE METABOLIC PANEL
ALBUMIN: 4.2 g/dL (ref 3.5–5.0)
ALK PHOS: 84 U/L (ref 38–126)
ALT: 22 U/L (ref 14–54)
ANION GAP: 9 (ref 5–15)
AST: 21 U/L (ref 15–41)
BUN: 10 mg/dL (ref 6–20)
CO2: 21 mmol/L — AB (ref 22–32)
Calcium: 9.7 mg/dL (ref 8.9–10.3)
Chloride: 104 mmol/L (ref 101–111)
Creatinine, Ser: 0.73 mg/dL (ref 0.44–1.00)
GFR calc Af Amer: >60 mL/min (ref >60)
GFR calc non Af Amer: >60 mL/min (ref >60)
GLUCOSE: 250 mg/dL — AB (ref 65–99)
POTASSIUM: 4.7 mmol/L (ref 3.5–5.1)
SODIUM: 134 mmol/L — AB (ref 135–145)
Total Bilirubin: 0.5 mg/dL (ref 0.3–1.2)
Total Protein: 8.1 g/dL (ref 6.5–8.1)

## 2015-09-07 LAB — URINALYSIS COMPLETE WITH MICROSCOPIC (ARMC ONLY)
BACTERIA UA: NONE SEEN
BILIRUBIN URINE: NEGATIVE
Glucose, UA: >500 mg/dL — AB
HGB URINE DIPSTICK: NEGATIVE
Ketones, ur: NEGATIVE mg/dL
LEUKOCYTES UA: NEGATIVE
NITRITE: NEGATIVE
PH: 5 (ref 5.0–8.0)
PROTEIN: NEGATIVE mg/dL
RBC / HPF: NONE SEEN RBC/hpf (ref 0–5)
Specific Gravity, Urine: 1.012 (ref 1.005–1.030)
Squamous Epithelial / LPF: NONE SEEN

## 2015-09-07 LAB — CBC WITH DIFFERENTIAL/PLATELET
BASOS PCT: 1 %
Basophils Absolute: 0.1 10*3/uL (ref 0–0.1)
EOS ABS: 0.3 10*3/uL (ref 0–0.7)
Eosinophils Relative: 3 %
HCT: 44.6 % (ref 35.0–47.0)
HEMOGLOBIN: 14.2 g/dL (ref 12.0–16.0)
Lymphocytes Relative: 36 %
Lymphs Abs: 4 10*3/uL — ABNORMAL HIGH (ref 1.0–3.6)
MCH: 26.9 pg (ref 26.0–34.0)
MCHC: 31.9 g/dL — AB (ref 32.0–36.0)
MCV: 84.5 fL (ref 80.0–100.0)
MONOS PCT: 8 %
Monocytes Absolute: 0.8 10*3/uL (ref 0.2–0.9)
NEUTROS PCT: 52 %
Neutro Abs: 5.7 10*3/uL (ref 1.4–6.5)
Platelets: 245 10*3/uL (ref 150–440)
RBC: 5.28 MIL/uL — ABNORMAL HIGH (ref 3.80–5.20)
RDW: 14.1 % (ref 11.5–14.5)
WBC: 11 10*3/uL (ref 3.6–11.0)

## 2015-09-07 LAB — GLUCOSE, CAPILLARY
GLUCOSE-CAPILLARY: 178 mg/dL — AB (ref 65–99)
Glucose-Capillary: 235 mg/dL — ABNORMAL HIGH (ref 65–99)

## 2015-09-07 MED ORDER — SODIUM CHLORIDE 0.9 % IV BOLUS (SEPSIS)
1000.0000 mL | Freq: Once | INTRAVENOUS | Status: AC
  Administered 2015-09-07: 1000 mL via INTRAVENOUS

## 2015-09-07 MED ORDER — PROMETHAZINE HCL 25 MG PO TABS: 25.0000 mg | ORAL_TABLET | Freq: Four times a day (QID) | ORAL | Status: DC | PRN

## 2015-09-07 MED ORDER — ONDANSETRON HCL 4 MG/2ML IJ SOLN
4.0000 mg | Freq: Once | INTRAMUSCULAR | Status: AC
  Administered 2015-09-07: 4 mg via INTRAVENOUS
  Filled 2015-09-07: qty 2

## 2015-09-07 MED ORDER — KETOROLAC TROMETHAMINE 30 MG/ML IJ SOLN
15.0000 mg | Freq: Once | INTRAMUSCULAR | Status: AC
  Administered 2015-09-07: 15 mg via INTRAVENOUS
  Filled 2015-09-07: qty 1

## 2015-09-07 NOTE — ED Notes (Signed)
Pt presents with multiple complaints - states flank pain, headache and mouth pain. Pt has chronic dental caries and is trying to save up the money to have her teeth fixed. States the headache is related to her teeth and that tylenol is not helping. Urine collected in triage and sent for possible uti

## 2015-09-07 NOTE — Discharge Instructions (Signed)
Back Exercises °If you have pain in your back, do these exercises 2-3 times each day or as told by your doctor. When the pain goes away, do the exercises once each day, but repeat the steps more times for each exercise (do more repetitions). If you do not have pain in your back, do these exercises once each day or as told by your doctor. °EXERCISES °Single Knee to Chest °Do these steps 3-5 times in a row for each leg: °· Lie on your back on a firm bed or the floor with your legs stretched out. °· Bring one knee to your chest. °· Hold your knee to your chest by grabbing your knee or thigh. °· Pull on your knee until you feel a gentle stretch in your lower back. °· Keep doing the stretch for 10-30 seconds. °· Slowly let go of your leg and straighten it. °Pelvic Tilt °Do these steps 5-10 times in a row: °· Lie on your back on a firm bed or the floor with your legs stretched out. °· Bend your knees so they point up to the ceiling. Your feet should be flat on the floor. °· Tighten your lower belly (abdomen) muscles to press your lower back against the floor. This will make your tailbone point up to the ceiling instead of pointing down to your feet or the floor. °· Stay in this position for 5-10 seconds while you gently tighten your muscles and breathe evenly. °Cat-Cow °Do these steps until your lower back bends more easily: °· Get on your hands and knees on a firm surface. Keep your hands under your shoulders, and keep your knees under your hips. You may put padding under your knees. °· Let your head hang down, and make your tailbone point down to the floor so your lower back is round like the back of a cat. °· Stay in this position for 5 seconds. °· Slowly lift your head and make your tailbone point up to the ceiling so your back hangs low (sags) like the back of a cow. °· Stay in this position for 5 seconds. °Press-Ups °Do these steps 5-10 times in a row: °· Lie on your belly (face-down) on the floor. °· Place your  hands near your head, about shoulder-width apart. °· While you keep your back relaxed and keep your hips on the floor, slowly straighten your arms to raise the top half of your body and lift your shoulders. Do not use your back muscles. To make yourself more comfortable, you may change where you place your hands. °· Stay in this position for 5 seconds. °· Slowly return to lying flat on the floor. °Bridges °Do these steps 10 times in a row: °1. Lie on your back on a firm surface. °2. Bend your knees so they point up to the ceiling. Your feet should be flat on the floor. °3. Tighten your butt muscles and lift your butt off of the floor until your waist is almost as high as your knees. If you do not feel the muscles working in your butt and the back of your thighs, slide your feet 1-2 inches farther away from your butt. °4. Stay in this position for 3-5 seconds. °5. Slowly lower your butt to the floor, and let your butt muscles relax. °If this exercise is too easy, try doing it with your arms crossed over your chest. °Belly Crunches °Do these steps 5-10 times in a row: °1. Lie on your back on a firm bed   or the floor with your legs stretched out. 2. Bend your knees so they point up to the ceiling. Your feet should be flat on the floor. 3. Cross your arms over your chest. 4. Tip your chin a little bit toward your chest but do not bend your neck. 5. Tighten your belly muscles and slowly raise your chest just enough to lift your shoulder blades a tiny bit off of the floor. 6. Slowly lower your chest and your head to the floor. Back Lifts Do these steps 5-10 times in a row: 1. Lie on your belly (face-down) with your arms at your sides, and rest your forehead on the floor. 2. Tighten the muscles in your legs and your butt. 3. Slowly lift your chest off of the floor while you keep your hips on the floor. Keep the back of your head in line with the curve in your back. Look at the floor while you do this. 4. Stay in  this position for 3-5 seconds. 5. Slowly lower your chest and your face to the floor. GET HELP IF:  Your back pain gets a lot worse when you do an exercise.  Your back pain does not lessen 2 hours after you exercise. If you have any of these problems, stop doing the exercises. Do not do them again unless your doctor says it is okay. GET HELP RIGHT AWAY IF:  You have sudden, very bad back pain. If this happens, stop doing the exercises. Do not do them again unless your doctor says it is okay.   This information is not intended to replace advice given to you by your health care provider. Make sure you discuss any questions you have with your health care provider.   Document Released: 04/13/2010 Document Revised: 11/30/2014 Document Reviewed: 05/05/2014 Elsevier Interactive Patient Education 2016 [employer] Inc[employer]  Back Pain, Adult Back pain is very common. The pain often gets better over time. The cause of back pain is usually not dangerous. Most people can learn to manage their back pain on their own.  HOME CARE  Watch your back pain for any changes. The following actions may help to lessen any pain you are feeling:  Stay active. Start with short walks on flat ground if you can. Try to walk farther each day.  Exercise regularly as told by your doctor. Exercise helps your back heal faster. It also helps avoid future injury by keeping your muscles strong and flexible.  Do not sit, drive, or stand in one place for more than 30 minutes.  Do not stay in bed. Resting more than 1-2 days can slow down your recovery.  Be careful when you bend or lift an object. Use good form when lifting:  Bend at your knees.  Keep the object close to your body.  Do not twist.  Sleep on a firm mattress. Lie on your side, and bend your knees. If you lie on your back, put a pillow under your knees.  Take medicines only as told by your doctor.  Put ice on the injured area.  Put ice in a plastic  bag.  Place a towel between your skin and the bag.  Leave the ice on for 20 minutes, 2-3 times a day for the first 2-3 days. After that, you can switch between ice and heat packs.  Avoid feeling anxious or stressed. Find good ways to deal with stress, such as exercise.  Maintain a healthy weight. Extra weight puts stress on your back. GET  HELP IF:   You have pain that does not go away with rest or medicine.  You have worsening pain that goes down into your legs or buttocks.  You have pain that does not get better in one week.  You have pain at night.  You lose weight.  You have a fever or chills. GET HELP RIGHT AWAY IF:   You cannot control when you poop (bowel movement) or pee (urinate).  Your arms or legs feel weak.  Your arms or legs lose feeling (numbness).  You feel sick to your stomach (nauseous) or throw up (vomit).  You have belly (abdominal) pain.  You feel like you may pass out (faint).   This information is not intended to replace advice given to you by your health care provider. Make sure you discuss any questions you have with your health care provider.   Document Released: 08/28/2007 Document Revised: 04/01/2014 Document Reviewed: 07/13/2013 Elsevier Interactive Patient Education 2016 Elsevier Inc.  Foot LockerHeat Therapy Heat therapy can help ease sore, stiff, injured, and tight muscles and joints. Heat relaxes your muscles, which may help ease your pain. Heat therapy should only be used on old, pre-existing, or long-lasting (chronic) injuries. Do not use heat therapy unless told by your doctor. HOW TO USE HEAT THERAPY There are several different kinds of heat therapy, including:  Moist heat pack.  Warm water bath.  Hot water bottle.  Electric heating pad.  Heated gel pack.  Heated wrap.  Electric heating pad. GENERAL HEAT THERAPY RECOMMENDATIONS   Do not sleep while using heat therapy. Only use heat therapy while you are awake.  Your skin may turn  pink while using heat therapy. Do not use heat therapy if your skin turns red.  Do not use heat therapy if you have new pain.  High heat or long exposure to heat can cause burns. Be careful when using heat therapy to avoid burning your skin.  Do not use heat therapy on areas of your skin that are already irritated, such as with a rash or sunburn. GET HELP IF:   You have blisters, redness, swelling (puffiness), or numbness.  You have new pain.  Your pain is worse. MAKE SURE YOU:  Understand these instructions.  Will watch your condition.  Will get help right away if you are not doing well or get worse.   This information is not intended to replace advice given to you by your health care provider. Make sure you discuss any questions you have with your health care provider.   Document Released: 06/03/2011 Document Revised: 04/01/2014 Document Reviewed: 05/04/2013 Elsevier Interactive Patient Education 2016 Else[employer] Inc.  Nausea, Adult Nausea means you feel sick to your stomach or need to throw up (vomit). It may be a sign of a more serious problem. If nausea gets worse, you may throw up. If you throw up a lot, you may lose too much body fluid (dehydration). HOME CARE   Get plenty of rest.  Ask your doctor how to replace body fluid losses (rehydrate).  Eat small amounts of food. Sip liquids more often.  Take all medicines as told by your doctor. GET HELP RIGHT AWAY IF:  You have a fever.  You pass out (faint).  You keep throwing up or have blood in your throw up.  You are very weak, have dry lips or a dry mouth, or you are very thirsty (dehydrated).  You have dark or bloody poop (stool).  You have very bad chest or  belly (abdominal) pain.  You do not get better after 2 days, or you get worse.  You have a headache. MAKE SURE YOU:  Understand these instructions.  Will watch your condition.  Will get help right away if you are not doing well or get worse.    This information is not intended to replace advice given to you by your health care provider. Make sure you discuss any questions you have with your health care provider.   Document Released: 02/28/2011 Document Revised: 06/03/2011 Document Reviewed: 02/28/2011 Elsevier Interactive Patient Education 2016 Else[employer] Inc.   Dental Pain    Dental pain may be caused by many things, including:  Tooth decay (cavities or caries). Cavities cause the nerve of your tooth to be open to air and hot or cold temperatures. This can cause pain or discomfort.  Abscess or infection. A dental abscess is an area that is full of infected pus from a bacterial infection in the inner part of the tooth (pulp). It usually happens at the end of the tooth's root.  Injury.  An unknown reason (idiopathic). Your pain may be mild or severe. It may only happen when:  You are chewing.  You are exposed to hot or cold temperature.  You are eating or drinking sugary foods or beverages, such as:  Soda.  Candy. Your pain may also be there all of the time.  HOME CARE  Watch your dental pain for any changes. Do these things to lessen your discomfort:  Take medicines only as told by your dentist.  If your dentist tells you to take an antibiotic medicine, finish all of it even if you start to feel better.  Keep all follow-up visits as told by your dentist. This is important.  Do not apply heat to the outside of your face.  Rinse your mouth or gargle with salt water if told by your dentist. This helps with pain and swelling.  You can make salt water by adding  tsp of salt to 1 cup of warm water. Apply ice to the painful area of your face:  Put ice in a plastic bag.  Place a towel between your skin and the bag.  Leave the ice on for 20 minutes, 2-3 times per day. Avoid foods or drinks that cause you pain, such as:  Very hot or very cold foods or drinks.  Sweet or sugary foods or drinks. GET HELP IF:  Your pain is not  helped with medicines.  Your symptoms are worse.  You have new symptoms. GET HELP RIGHT AWAY IF:  You cannot open your mouth.  You are having trouble breathing or swallowing.  You have a fever.  Your face, neck, or jaw is puffy (swollen). This information is not intended to replace advice given to you by your health care provider. Make sure you discuss any questions you have with your health care provider.  Document Released: 08/28/2007 Document Revised: 07/26/2014 Document Reviewed: 03/07/2014  Elsevier Interactive Patient Education [employer]

## 2015-09-07 NOTE — ED Notes (Signed)
Pt reports HA and pain in left flank since last weekend.  Pt has dental issues which is chronic.  Pain in left side is worse with movement and states that it feels like "something is pulling"

## 2015-09-07 NOTE — ED Notes (Signed)
bs 235

## 2015-09-07 NOTE — ED Provider Notes (Signed)
[facility] Emergency Department Provider Note  ____________________________________________  Time seen: Approximately 12:19 PM  I have reviewed the triage vital signs and the nursing notes.   HISTORY  Chief Complaint Headache; Dental Pain; and Flank Pain    HPI [name] is a 55 y.o. female , NAD, presents to the emergency department with 2 week history of left lower back and flank pain. States she feels like "something is pulling". Denies upper back or neck pain. Has not had any saddle paresthesias, numbness, weakness, tingling. Has had some lower abdominal pressure with nausea as well. Denies any dysuria, hematuria, vaginal discharge, pelvic pain. Denies any radiation of pain. Has had no fevers, chills, body aches. No vomiting, diarrhea. Does note that she had some feelings of constipation and took Castor oil yesterday which alleviated the symptoms. Had a normal bowel movement yesterday. Also notes she has chronic dental pain which can cause headaches. Has taken Tylenol over the counter yesterday without any significant relief of symptoms. States that her primary care provider told her that she can only take Tylenol for her pain and advised [name] against over-the-counter NSAIDs. When questioned further the patient states she was taking ibuprofen on a more than regular basis, hence her primary care provider changing her to Tylenol. Patient denies any hematemesis, nor history of GI bleeds.Patient takes her medications on a daily basis and last check of her glucose level was 138 yesterday. Has not noted any changes in her glucose levels since these symptoms have begun over the last 2 weeks. Has an appointment for follow-up with her primary care provider on 09/13/2015.   Past Medical History  Diagnosis Date  . Asthma   . Diabetes mellitus   . GERD (gastroesophageal reflux disease)   . Fibromyalgia   . Fibromyalgia   . Asthma   . Hypertension     Patient  Active Problem List   Diagnosis Date Noted  . Essential hypertension 05/15/2015  . Fibromyalgia 04/19/2015  . Diabetes (HCC) 02/08/2015    Past Surgical History  Procedure Laterality Date  . Abdominal surgery    . Abdominal hysterectomy      Current Outpatient Rx  Name  Route  Sig  Dispense  Refill  . Albuterol (VENTOLIN IN)   Inhalation   Inhale 1 puff into the lungs 2 (two) times daily.         Marland Kitchen aspirin 81 MG tablet   Oral   Take 81 mg by mouth daily.         . cetirizine (ZYRTEC) 10 MG tablet   Oral   Take 10 mg by mouth at bedtime.         . citalopram (CELEXA) 40 MG tablet   Oral   Take 40 mg by mouth daily.         Marland Kitchen gabapentin (NEURONTIN) 300 MG capsule   Oral   Take 300 mg by mouth 4 (four) times daily.         . hydrOXYzine (ATARAX/VISTARIL) 10 MG tablet   Oral   Take 1 tablet (10 mg total) by mouth every 6 (six) hours as needed for anxiety.   20 tablet   0   . lisinopril (PRINIVIL,ZESTRIL) 20 MG tablet   Oral   Take 10 mg by mouth daily. Cut tab in half         . metFORMIN (GLUCOPHAGE) 500 MG tablet   Oral   Take by mouth 2 (two) times daily with a meal.         .  promethazine (PHENERGAN) 25 MG tablet   Oral   Take 1 tablet (25 mg total) by mouth every 6 (six) hours as needed for nausea or vomiting.   10 tablet   0   . ranitidine (ZANTAC) 150 MG tablet   Oral   Take 150 mg by mouth 2 (two) times daily.         . rosuvastatin (CRESTOR) 20 MG tablet   Oral   Take 20 mg by mouth daily.         . sitaGLIPtin (JANUVIA) 100 MG tablet   Oral   Take 100 mg by mouth daily.           Allergies Benadryl; Bextra; and Tramadol  Family History  Problem Relation Age of Onset  . Diabetes Mother   . Hypertension Mother     Social History Social History  Substance Use Topics  . Smoking status: Current Every Day Smoker -- 0.50 packs/day    Types: Cigarettes  . Smokeless tobacco: Current User  . Alcohol Use: No      Comment: stopped 1 week ago, social drinker     Review of Systems  Constitutional: No fever/chills, fatigue Eyes: No visual changes.  Cardiovascular: No chest pain. Respiratory: No cough. No shortness of breath. No wheezing.  Gastrointestinal Positive lower abdominal pressure, nausea.  Positive constipation that has resolved. No vomiting.  No diarrhea. Genitourinary: Negative for dysuria, hematuria, vaginal discharge, pelvic pain. No urinary hesitancy, urgency or increased frequency. Musculoskeletal: Positive for left lower back/flank pain.  Skin: Negative for rash, skin sores. Neurological: Negative for headaches, focal weakness or numbness. No saddle paraesthesias, no tingling.  10-point ROS otherwise negative.  ____________________________________________   PHYSICAL EXAM:  VITAL SIGNS: ED Triage Vitals  Enc Vitals Group     BP 09/07/15 1203 105/70 mmHg     Pulse Rate 09/07/15 1203 80     Resp 09/07/15 1203 18     Temp 09/07/15 1203 97.7 F (36.5 C)     Temp Source 09/07/15 1203 Oral     SpO2 09/07/15 1203 95 %     Weight 09/07/15 1203 140 lb (63.504 kg)     Height 09/07/15 1203 4\' 10"  (1.473 m)     Head Cir --      Peak Flow --      Pain Score 09/07/15 1208 9     Pain Loc --      Pain Edu? --      Excl. in GC? --      Constitutional: Alert and oriented. Well appearing and in no acute distress. Eyes: Conjunctivae are normal.  Head: Atraumatic. ENT:      Nose: No congestion/rhinnorhea.      Mouth/Throat: Mucous membranes are moist.  Neck: Supple with FROM. No cervical spine tenderness to palpation. Hematological/Lymphatic/Immunilogical: No cervical lymphadenopathy. Cardiovascular: Normal rate, regular rhythm. Normal S1 and S2.  Good peripheral circulation with 2+ pulses in bilateral lower extermities. Respiratory: Normal respiratory effort without tachypnea or retractions. Lungs CTAB with breath sounds noted in all lung fields. Gastrointestinal: Mild tenderness to  the suprapubic area but is soft and non tender without distention. All other quadrants are soft, non tender, without distention or guarding.  No hernias to palpation or visualization. No CVA tenderness. Musculoskeletal: Moderate tenderness to palpation about the left lateral paraspinal region of the back. No flank pain to palpation. No pain to palpation about the thoracic, lumbar, sacral central spine. Full range of motion of the lumbar spine with  mild discomfort in the left lateral region with full flexion. No SI joint tenderness. No pain with pressure applied to the hips. No lower extremity tenderness nor edema.  No joint effusions. Neurologic:  Normal speech and language. No gross focal neurologic deficits are appreciated. Normal gait. Skin:  Skin is warm, dry and intact. No rash noted. Psychiatric: Mood and affect are normal. Speech and behavior are normal. Patient exhibits appropriate insight and judgement.   ____________________________________________   LABS (all labs ordered are listed, but only abnormal results are displayed)  Labs Reviewed  URINALYSIS COMPLETEWITH MICROSCOPIC ([facility] ONLY) - Abnormal; Notable for the following:    Color, Urine YELLOW (*)    APPearance CLEAR (*)    Glucose, UA >500 (*)    All other components within normal limits  CBC WITH DIFFERENTIAL/PLATELET - Abnormal; Notable for the following:    RBC 5.28 (*)    MCHC 31.9 (*)    Lymphs Abs 4.0 (*)    All other components within normal limits  COMPREHENSIVE METABOLIC PANEL - Abnormal; Notable for the following:    Sodium 134 (*)    CO2 21 (*)    Glucose, Bld 250 (*)    All other components within normal limits  GLUCOSE, CAPILLARY - Abnormal; Notable for the following:    Glucose-Capillary 235 (*)    All other components within normal limits  GLUCOSE, CAPILLARY  CBG MONITORING, ED  CBG MONITORING, ED    ____________________________________________  EKG  None ____________________________________________  RADIOLOGY  None ____________________________________________    PROCEDURES  Procedure(s) performed: None    Medications  sodium chloride 0.9 % bolus 1,000 mL (1,000 mLs Intravenous New Bag/Given 09/07/15 1353)  ketorolac (TORADOL) 30 MG/ML injection 15 mg (15 mg Intravenous Given 09/07/15 1354)  ondansetron (ZOFRAN) injection 4 mg (4 mg Intravenous Given 09/07/15 1354)   ----------------------------------------- 2:24 PM on 09/07/2015 -----------------------------------------  Patient has tolerated Toradol and Zofran the IV well without side effects. She notes that she feels well. She has had decline in headache, back pain as well as nausea. She has about 500 mL of IV saline to complete and I will check on her approximate 30 minutes.  ____________________________________________   INITIAL IMPRESSION / ASSESSMENT AND PLAN / ED COURSE  Pertinent labs & imaging results that were available during my care of the patient were reviewed by me and considered in my medical decision making (see chart for details).  No hematuria, ketonuria on urinalysis nor evidence of an infection. Discussed option of CT scanning of the abdomen and pelvis. At this time patient's current assessment is not indicative of an acute infection nor other abnormality. Patient is in agreement to hold off on CT scan at this time and monitor symptoms over the course of the next few days. She does verbalize understanding that if her pain worsens or she has any onset of new symptoms she is to return to this emergency department for further evaluation and treatment. Patient will keep her appointment for follow-up with her primary care provider on 09/13/2015 as scheduled.  Blood glucose was 178 at time of discharge.  Patient's diagnosis is consistent with left-sided low back pain without sciatica, nausea and chronic  dental pain causing headache. Patient will be discharged home with prescriptions for Phenergan to use as directed. Patient may continue over-the-counter Tylenol as needed for pain. Advised warm heat to the effected areas 20 minutes 3-4 times daily. Advised patient to complete range of motion and light stretching exercises 3-4 times daily. Patient  is to follow up with her primary care provider on 09/13/2015 as currently scheduled for recheck. Patient is given ED precautions to return to the ED for any worsening or new symptoms.      ____________________________________________  FINAL CLINICAL IMPRESSION(S) / ED DIAGNOSES  Final diagnoses:  Left-sided low back pain without sciatica  Nausea  Chronic dental pain      NEW MEDICATIONS STARTED DURING THIS VISIT:  New Prescriptions   PROMETHAZINE (PHENERGAN) 25 MG TABLET    Take 1 tablet (25 mg total) by mouth every 6 (six) hours as needed for nausea or vomiting.         Hope Pigeon, PA-C 09/07/15 1501  Jene Every, MD 09/07/15 [phone]

## 2015-09-13 ENCOUNTER — Other Ambulatory Visit: Payer: Self-pay

## 2015-09-13 DIAGNOSIS — E119 Type 2 diabetes mellitus without complications: Secondary | ICD-10-CM

## 2015-09-14 LAB — COMPREHENSIVE METABOLIC PANEL
ALBUMIN: 4.4 g/dL (ref 3.5–5.5)
ALK PHOS: 87 IU/L (ref 39–117)
ALT: 19 IU/L (ref 0–32)
AST: 15 IU/L (ref 0–40)
Albumin/Globulin Ratio: 1.6 (ref 1.2–2.2)
BUN / CREAT RATIO: 18 (ref 9–23)
BUN: 12 mg/dL (ref 6–24)
Bilirubin Total: 0.3 mg/dL (ref 0.0–1.2)
CALCIUM: 9.7 mg/dL (ref 8.7–10.2)
CO2: 19 mmol/L (ref 18–29)
CREATININE: 0.68 mg/dL (ref 0.57–1.00)
Chloride: 101 mmol/L (ref 96–106)
GFR calc Af Amer: 115 mL/min/{1.73_m2} (ref >59)
GFR, EST NON AFRICAN AMERICAN: 99 mL/min/{1.73_m2} (ref >59)
GLUCOSE: 138 mg/dL — AB (ref 65–99)
Globulin, Total: 2.7 g/dL (ref 1.5–4.5)
Potassium: 4.9 mmol/L (ref 3.5–5.2)
SODIUM: 137 mmol/L (ref 134–144)
TOTAL PROTEIN: 7.1 g/dL (ref 6.0–8.5)

## 2015-09-14 LAB — CBC WITH DIFFERENTIAL/PLATELET
BASOS: 0 %
Basophils Absolute: 0 10*3/uL (ref 0.0–0.2)
EOS (ABSOLUTE): 0.2 10*3/uL (ref 0.0–0.4)
EOS: 2 %
HEMATOCRIT: 42.6 % (ref 34.0–46.6)
HEMOGLOBIN: 14 g/dL (ref 11.1–15.9)
IMMATURE GRANULOCYTES: 0 %
Immature Grans (Abs): 0 10*3/uL (ref 0.0–0.1)
Lymphocytes Absolute: 4.7 10*3/uL — ABNORMAL HIGH (ref 0.7–3.1)
Lymphs: 44 %
MCH: 26.8 pg (ref 26.6–33.0)
MCHC: 32.9 g/dL (ref 31.5–35.7)
MCV: 82 fL (ref 79–97)
MONOCYTES: 7 %
Monocytes Absolute: 0.7 10*3/uL (ref 0.1–0.9)
NEUTROS PCT: 47 %
Neutrophils Absolute: 5 10*3/uL (ref 1.4–7.0)
Platelets: 342 10*3/uL (ref 150–379)
RBC: 5.23 x10E6/uL (ref 3.77–5.28)
RDW: 13.9 % (ref 12.3–15.4)
WBC: 10.6 10*3/uL (ref 3.4–10.8)

## 2015-09-14 LAB — LIPID PANEL
CHOL/HDL RATIO: 3.6 ratio (ref 0.0–4.4)
Cholesterol, Total: 94 mg/dL — ABNORMAL LOW (ref 100–199)
HDL: 26 mg/dL — AB (ref >39)
LDL CALC: 38 mg/dL (ref 0–99)
TRIGLYCERIDES: 151 mg/dL — AB (ref 0–149)
VLDL CHOLESTEROL CAL: 30 mg/dL (ref 5–40)

## 2015-09-14 LAB — HEMOGLOBIN A1C
Est. average glucose Bld gHb Est-mCnc: 255 mg/dL
Hgb A1c MFr Bld: 10.5 % — ABNORMAL HIGH (ref 4.8–5.6)

## 2015-09-20 ENCOUNTER — Ambulatory Visit: Payer: Self-pay | Admitting: Internal Medicine

## 2015-09-28 ENCOUNTER — Other Ambulatory Visit: Payer: Self-pay | Admitting: Internal Medicine

## 2015-11-02 ENCOUNTER — Ambulatory Visit: Payer: Self-pay

## 2015-11-07 ENCOUNTER — Ambulatory Visit: Payer: Self-pay | Admitting: Family Medicine

## 2015-11-07 VITALS — BP 108/77 | Temp 98.2°F | Wt 138.0 lb

## 2015-11-07 DIAGNOSIS — E119 Type 2 diabetes mellitus without complications: Secondary | ICD-10-CM

## 2015-11-07 LAB — GLUCOSE, POCT (MANUAL RESULT ENTRY): POC Glucose: 151 mg/dl — AB (ref 70–99)

## 2015-11-07 MED ORDER — GLIPIZIDE 5 MG PO TABS
5.0000 mg | ORAL_TABLET | Freq: Every day | ORAL | 5 refills | Status: DC
Start: 2015-11-07 — End: 2016-04-18

## 2015-11-07 MED ORDER — TIOTROPIUM BROMIDE MONOHYDRATE 18 MCG IN CAPS: 18.0000 ug | ORAL_CAPSULE | Freq: Every day | RESPIRATORY_TRACT | 5 refills | Status: DC

## 2015-11-07 NOTE — Patient Instructions (Addendum)
Follow up in 2 months with labs.

## 2015-11-07 NOTE — Progress Notes (Unsigned)
   Subjective:    Patient ID: [name], female    DOB: 15-Jan-1961, 55 y.o.   MRN: [id number]  HPI  Pt following up today with diabetes Pain in both right and left side of neck.  Pt. Reports having periodontal disease Pt continues to smoking and has coughing and ha shortness of breath  Patient Active Problem List   Diagnosis Date Noted  . Essential hypertension 05/15/2015  . Fibromyalgia 04/19/2015  . Diabetes ([place]) 02/08/2015      Medication List       Accurate as of 11/07/15  6:44 PM. Always use your most recent med list.          aspirin 81 MG tablet Take 81 mg by mouth daily.   cetirizine 10 MG tablet Commonly known as:  ZYRTEC Take 10 mg by mouth at bedtime.   citalopram 40 MG tablet Commonly known as:  CELEXA Take 40 mg by mouth daily.   CRESTOR 20 MG tablet Generic drug:  rosuvastatin TAKE ONE TABLET BY MOUTH EVERY DAY   gabapentin 300 MG capsule Commonly known as:  NEURONTIN Take 300 mg by mouth 4 (four) times daily.   glipiZIDE 5 MG tablet Commonly known as:  GLUCOTROL Take 1 tablet (5 mg total) by mouth daily before breakfast.   hydrOXYzine 10 MG tablet Commonly known as:  ATARAX/VISTARIL Take 1 tablet (10 mg total) by mouth every 6 (six) hours as needed for anxiety.   lisinopril 20 MG tablet Commonly known as:  PRINIVIL,ZESTRIL Take 10 mg by mouth daily. Cut tab in half   metFORMIN 500 MG tablet Commonly known as:  GLUCOPHAGE Take by mouth 2 (two) times daily with a meal.   promethazine 25 MG tablet Commonly known as:  PHENERGAN Take 1 tablet (25 mg total) by mouth every 6 (six) hours as needed for nausea or vomiting.   ranitidine 150 MG tablet Commonly known as:  ZANTAC Take 150 mg by mouth 2 (two) times daily.   sitaGLIPtin 100 MG tablet Commonly known as:  JANUVIA Take 100 mg by mouth daily.   tiotropium 18 MCG inhalation capsule Commonly known as:  SPIRIVA Place 1 capsule (18 mcg total) into inhaler and inhale  daily.   VENTOLIN IN Inhale 1 puff into the lungs 2 (two) times daily.        Review of Systems Right ear: no infection present    Objective:   Physical Exam  Pulmonary/Chest: She has wheezes.  expiratory wheezes and inspiratory rhonchi  BP 108/77   Temp 98.2 F (36.8 C)   Wt 138 lb (62.6 kg)   BMI 28.84 kg/m        Assessment & Plan:  Pt diagnosed with COPD  spiriva prescribed and 79m glipizide Pt encouraged to stop smoking  Follow up in 2 months with labs: met c a1c

## 2015-11-13 ENCOUNTER — Emergency Department
Admission: EM | Admit: 2015-11-13 | Discharge: 2015-11-13 | Disposition: A | Discharge status: Home / Self Care | Payer: Medicaid Other | Attending: Emergency Medicine | Admitting: Emergency Medicine

## 2015-11-13 ENCOUNTER — Encounter: Payer: Self-pay | Admitting: Emergency Medicine

## 2015-11-13 DIAGNOSIS — K029 Dental caries, unspecified: Secondary | ICD-10-CM | POA: Insufficient documentation

## 2015-11-13 DIAGNOSIS — E119 Type 2 diabetes mellitus without complications: Secondary | ICD-10-CM | POA: Diagnosis not present

## 2015-11-13 DIAGNOSIS — Z7984 Long term (current) use of oral hypoglycemic drugs: Secondary | ICD-10-CM | POA: Insufficient documentation

## 2015-11-13 DIAGNOSIS — Z79899 Other long term (current) drug therapy: Secondary | ICD-10-CM | POA: Insufficient documentation

## 2015-11-13 DIAGNOSIS — K0889 Other specified disorders of teeth and supporting structures: Secondary | ICD-10-CM | POA: Diagnosis present

## 2015-11-13 DIAGNOSIS — J45909 Unspecified asthma, uncomplicated: Secondary | ICD-10-CM | POA: Insufficient documentation

## 2015-11-13 DIAGNOSIS — I10 Essential (primary) hypertension: Secondary | ICD-10-CM | POA: Diagnosis not present

## 2015-11-13 DIAGNOSIS — Z7982 Long term (current) use of aspirin: Secondary | ICD-10-CM | POA: Insufficient documentation

## 2015-11-13 DIAGNOSIS — F17210 Nicotine dependence, cigarettes, uncomplicated: Secondary | ICD-10-CM | POA: Diagnosis not present

## 2015-11-13 MED ORDER — AMOXICILLIN 500 MG PO TABS: 500.0000 mg | ORAL_TABLET | Freq: Two times a day (BID) | ORAL | 0 refills | Status: DC

## 2015-11-13 MED ORDER — OXYCODONE-ACETAMINOPHEN 5-325 MG PO TABS: 1.0000 | ORAL_TABLET | ORAL | 0 refills | Status: DC | PRN

## 2015-11-13 MED ORDER — OXYCODONE-ACETAMINOPHEN 5-325 MG PO TABS
2.0000 | ORAL_TABLET | Freq: Once | ORAL | Status: AC
  Administered 2015-11-13: 2 via ORAL
  Filled 2015-11-13: qty 2

## 2015-11-13 MED ORDER — AMOXICILLIN 500 MG PO CAPS
500.0000 mg | ORAL_CAPSULE | Freq: Once | ORAL | Status: AC
  Administered 2015-11-13: 500 mg via ORAL
  Filled 2015-11-13: qty 1

## 2015-11-13 NOTE — ED Provider Notes (Signed)
[facility] Emergency Department Provider Note  ____________________________________________  Time seen: Approximately 3:45 PM  I have reviewed the triage vital signs and the nursing notes.   HISTORY  Chief Complaint Dental Pain    HPI [name] is a 55 y.o. female who presents for evaluation of dental pain. [facility] and given a list of things that need to be taken care. She currently does not have money to get that teeth repaired. Is a diabetic and concerned about infection.   Past Medical History:  Diagnosis Date  . Asthma   . Asthma   . Diabetes mellitus   . Fibromyalgia   . Fibromyalgia   . GERD (gastroesophageal reflux disease)   . Hypertension     Patient Active Problem List   Diagnosis Date Noted  . Essential hypertension 05/15/2015  . Fibromyalgia 04/19/2015  . Diabetes (HCC) 02/08/2015    Past Surgical History:  Procedure Laterality Date  . ABDOMINAL HYSTERECTOMY    . ABDOMINAL SURGERY      Prior to Admission medications   Medication Sig Start Date End Date Taking? Authorizing Provider  Albuterol (VENTOLIN IN) Inhale 1 puff into the lungs 2 (two) times daily.    Historical Provider, MD  amoxicillin (AMOXIL) 500 MG tablet Take 1 tablet (500 mg total) by mouth 2 (two) times daily. 11/13/15   Charmayne Sheerharles M Beers, PA-C  aspirin 81 MG tablet Take 81 mg by mouth daily.    Historical Provider, MD  cetirizine (ZYRTEC) 10 MG tablet Take 10 mg by mouth at bedtime.    Historical Provider, MD  citalopram (CELEXA) 40 MG tablet Take 40 mg by mouth daily.    Historical Provider, MD  CRESTOR 20 MG tablet TAKE ONE TABLET BY MOUTH EVERY DAY 09/28/15   Harle BattiestShannon A McGowan, PA-C  gabapentin (NEURONTIN) 300 MG capsule Take 300 mg by mouth 4 (four) times daily. 03/08/15   Historical Provider, MD  glipiZIDE (GLUCOTROL) 5 MG tablet Take 1 tablet (5 mg total) by mouth daily before breakfast. 11/07/15   Maple Hudsonichard  L Gilbert Jr., MD  hydrOXYzine (ATARAX/VISTARIL) 10 MG tablet Take 1 tablet (10 mg total) by mouth every 6 (six) hours as needed for anxiety. 12/30/14   Minna AntisKevin Paduchowski, MD  lisinopril (PRINIVIL,ZESTRIL) 20 MG tablet Take 10 mg by mouth daily. Cut tab in half 04/19/15   Historical Provider, MD  metFORMIN (GLUCOPHAGE) 500 MG tablet Take by mouth 2 (two) times daily with a meal. 04/19/15   Historical Provider, MD  oxyCODONE-acetaminophen (ROXICET) 5-325 MG tablet Take 1-2 tablets by mouth every 4 (four) hours as needed for severe pain. 11/13/15   Evangeline Dakinharles M Beers, PA-C  promethazine (PHENERGAN) 25 MG tablet Take 1 tablet (25 mg total) by mouth every 6 (six) hours as needed for nausea or vomiting. 09/07/15   Jami L Hagler, PA-C  ranitidine (ZANTAC) 150 MG tablet Take 150 mg by mouth 2 (two) times daily. 04/19/15   Historical Provider, MD  sitaGLIPtin (JANUVIA) 100 MG tablet Take 100 mg by mouth daily.    Historical Provider, MD  tiotropium (SPIRIVA) 18 MCG inhalation capsule Place 1 capsule (18 mcg total) into inhaler and inhale daily. 11/07/15 11/08/15  Maple Hudsonichard L Gilbert Jr., MD    Allergies Benadryl [diphenhydramine hcl]; Bextra [valdecoxib]; and Tramadol  Family History  Problem Relation Age of Onset  . Diabetes Mother   . Hypertension Mother     Social History Social History  Substance Use Topics  . Smoking status: Current Every Day Smoker    Packs/day: 0.50    Types: Cigarettes  . Smokeless tobacco: Current User  . Alcohol use No     Comment: stopped 1 week ago, social drinker    Review of Systems Constitutional: No fever/chills Eyes: No visual changes. ENT: Positive for dental pain. Skin: Negative for rash. Neurological: Negative for headaches, focal weakness or numbness.  10-point ROS otherwise negative.  ____________________________________________   PHYSICAL EXAM:  VITAL SIGNS: ED Triage Vitals  Enc Vitals Group     BP 11/13/15 1530 126/86     Pulse Rate 11/13/15 1530  91     Resp 11/13/15 1530 16     Temp 11/13/15 1530 98.5 F (36.9 C)     Temp Source 11/13/15 1530 Oral     SpO2 11/13/15 1530 93 %     Weight 11/13/15 1531 138 lb (62.6 kg)     Height 11/13/15 1531 4\' 7"  (1.397 m)     Head Circumference --      Peak Flow --      Pain Score 11/13/15 1535 10     Pain Loc --      Pain Edu? --      Excl. in GC? --     Constitutional: Alert and oriented. Well appearing and in no acute distress. Nose: No congestion/rhinnorhea. Mouth/Throat: Mucous membranes are moist.  Oropharynx non-erythematous. Multiple areas of dental caries and fractured teeth. Neck: No stridor.  No adenopathy noted. Cardiovascular: Normal rate, regular rhythm. Grossly normal heart sounds.  Good peripheral circulation. Respiratory: Normal respiratory effort.  No retractions. Lungs CTAB. Musculoskeletal: No lower extremity tenderness nor edema.  No joint effusions. Neurologic:  Normal speech and language. No gross focal neurologic deficits are appreciated. No gait instability. Skin:  Skin is warm, dry and intact. No rash noted. Psychiatric: Mood and affect are normal. Speech and behavior are normal.  ____________________________________________   LABS (all labs ordered are listed, but only abnormal results are displayed)  Labs Reviewed - No data to display ____________________________________________   PROCEDURES  Procedure(s) performed: None  Critical Care performed: No  ____________________________________________   INITIAL IMPRESSION / ASSESSMENT AND PLAN / ED COURSE  Pertinent labs & imaging results that were available during my care of the patient were reviewed by me and considered in my medical decision making (see chart for details). Review of the [place] CSRS was performed in accordance of the [facility] prior to dispensing any controlled drugs.  Acute dental pain with dental abscess. Prescription given for amoxicillin 500 mg 3 times a day and Percocet 5/325. [facility]  Clinical Course    ____________________________________________   FINAL CLINICAL IMPRESSION(S) / ED DIAGNOSES  Final diagnoses:  Dental caries  Pain, dental     This chart was dictated using voice recognition software/Dragon. Despite best efforts to proofread, errors can occur which can change the meaning. Any change was purely unintentional.    Evangeline Dakinharles M Beers, PA-C 11/13/15 1626    Emily FilbertJonathan E Williams, MD 11/14/15 [phone]

## 2015-11-13 NOTE — ED Triage Notes (Signed)
Dental pain since Friday.

## 2015-12-14 ENCOUNTER — Emergency Department
Admission: EM | Admit: 2015-12-14 | Discharge: 2015-12-14 | Disposition: A | Discharge status: Home / Self Care | Payer: Medicaid Other | Attending: Emergency Medicine | Admitting: Emergency Medicine

## 2015-12-14 DIAGNOSIS — Z79899 Other long term (current) drug therapy: Secondary | ICD-10-CM | POA: Diagnosis not present

## 2015-12-14 DIAGNOSIS — J45909 Unspecified asthma, uncomplicated: Secondary | ICD-10-CM | POA: Insufficient documentation

## 2015-12-14 DIAGNOSIS — E119 Type 2 diabetes mellitus without complications: Secondary | ICD-10-CM | POA: Insufficient documentation

## 2015-12-14 DIAGNOSIS — F17210 Nicotine dependence, cigarettes, uncomplicated: Secondary | ICD-10-CM | POA: Diagnosis not present

## 2015-12-14 DIAGNOSIS — F17290 Nicotine dependence, other tobacco product, uncomplicated: Secondary | ICD-10-CM | POA: Diagnosis not present

## 2015-12-14 DIAGNOSIS — K0889 Other specified disorders of teeth and supporting structures: Secondary | ICD-10-CM

## 2015-12-14 DIAGNOSIS — I10 Essential (primary) hypertension: Secondary | ICD-10-CM | POA: Insufficient documentation

## 2015-12-14 DIAGNOSIS — K047 Periapical abscess without sinus: Secondary | ICD-10-CM | POA: Diagnosis not present

## 2015-12-14 DIAGNOSIS — K029 Dental caries, unspecified: Secondary | ICD-10-CM | POA: Diagnosis not present

## 2015-12-14 DIAGNOSIS — Z7984 Long term (current) use of oral hypoglycemic drugs: Secondary | ICD-10-CM | POA: Diagnosis not present

## 2015-12-14 DIAGNOSIS — Z7982 Long term (current) use of aspirin: Secondary | ICD-10-CM | POA: Insufficient documentation

## 2015-12-14 MED ORDER — PENICILLIN V POTASSIUM 500 MG PO TABS: 500.0000 mg | ORAL_TABLET | Freq: Four times a day (QID) | ORAL | 0 refills | Status: DC

## 2015-12-14 NOTE — Discharge Instructions (Signed)
Take the prescription antibiotic as directed. Follow-up with one of the dental clinics listed below for routine dental care.   OPTIONS FOR DENTAL FOLLOW UP CARE  [place] Department of Health and Human Services - Local Safety Net Dental Clinics TripDoors.comhttp://www.ncdhhs.gov/dph/oralhealth/services/safetynetclinics.htm   [facility] [phone])  [facility] [phone])  [facility] [phone]  [facility] [phone])  [facility] [phone]) This clinic caters to the indigent population and is on a lottery system. Location: [employer] [address] [address], [address], [place] [facility] Hours: Wednesdays from 6pm - 9pm, patients seen by a lottery system. For dates, call or go to [email or web address] Services: Cleanings, fillings and simple extractions. Payment Options: DENTAL WORK IS FREE OF CHARGE. Bring proof of income or support. Best way to get seen: Arrive at 5:15 pm - this is a lottery, NOT first come/first serve, so arriving earlier will not increase your chances of being seen.     [facility] [phone] Select option 1 for emergencies   Location: [facility], [address], [place] [facility] Hours: No walk-ins accepted - call the day before to schedule an appointment. Check in times are 9:30 am and 1:30 pm. Services: Simple extractions, temporary fillings, pulpectomy/pulp debridement, uncomplicated abscess drainage. Payment Options: PAYMENT IS DUE AT THE TIME OF SERVICE.  Fee is usually $100-200, additional surgical procedures (e.g. abscess drainage) may be extra. Cash, checks, Visa/MasterCard accepted.  Can file Medicaid if patient is covered for dental - patient should call case worker to check. No discount for [facility] patients. Best way to get seen: MUST call the day before and get onto the  schedule. Can usually be seen the next 1-2 days. No walk-ins accepted.     [facility] [phone]   Location: [facility], [address], [place] [facility] Hours: M, W, Th, F 8am or 1:30pm, Tues 9a or 1:30 - first come/first served. Services: Simple extractions, temporary fillings, uncomplicated abscess drainage.  You do not need to be an [place] resident. Payment Options: PAYMENT IS DUE AT THE TIME OF SERVICE. Dental insurance, otherwise sliding scale - bring proof of income or support. Depending on income and treatment needed, cost is usually $50-200. Best way to get seen: Arrive early as it is first come/first served.     [facility] [phone]   Location: [address] Clinic Hours: Mon-Thu 8a-5p Services: Most basic dental services including extractions and fillings. Payment Options: PAYMENT IS DUE AT THE TIME OF SERVICE. Sliding scale, up to 50% off - bring proof if income or support. Medicaid with dental option accepted. Best way to get seen: Call to schedule an appointment, can usually be seen within 2 weeks OR they will try to see walk-ins - show up at 8a or 2p (you may have to wait).     [facility] [phone] [place] RESIDENTS ONLY   Location: [facility], [address], [place], [place] [id number] Clinic Hours: By appointment only. Monday - Thursday 8am-5pm, Friday 8am-12pm Services: Cleanings, fillings, extractions. Payment Options: PAYMENT IS DUE AT THE TIME OF SERVICE. Cash, Visa or MasterCard. Sliding scale - $30 minimum per service. Best way to get seen: Come in to office, complete packet and make an appointment - need proof of income or support monies for each household member and proof of [place] residence. Usually takes about  a month to get in.     [facility] [phone]    Location: [address] [place] [facility] Hours: Walk-in Urgent Care Dental Services are offered Monday-Friday mornings only. The numbers of emergencies accepted daily is limited to the number of providers available. Maximum 15 - Mondays, Wednesdays & Thursdays Maximum 10 - Tuesdays & Fridays Services: You do not need to be a [facility] resident to be seen for a dental emergency. Emergencies are defined as pain, swelling, abnormal bleeding, or dental trauma. Walkins will receive x-rays if needed. NOTE: Dental cleaning is not an emergency. Payment Options: PAYMENT IS DUE AT THE TIME OF SERVICE. Minimum co-pay is $40.00 for uninsured patients. Minimum co-pay is $3.00 for Medicaid with dental coverage. Dental Insurance is accepted and must be presented at time of visit. Medicare does not cover dental. Forms of payment: Cash, credit card, checks. Best way to get seen: If not previously registered with the clinic, walk-in dental registration begins at 7:15 am and is on a first come/first serve basis. If previously registered with the clinic, call to make an appointment.     [facility] [place] ONLY   Location: [address] [place], [place] Clinic Hours: Mon-Thu 10a-2p Services: Extractions only! Payment Options: FREE (donations accepted) - bring proof of income or support Best way to get seen: Call and schedule an appointment OR come at 8am on the 1st Monday of every month (except for holidays) when it is first come/first served.     [facility] [phone]   Location: [place], [place] [facility] Hours: Friday mornings Services, Payment Options, Best way to get seen: Call for info

## 2015-12-14 NOTE — ED Provider Notes (Signed)
[facility] Emergency Department Provider Note ____________________________________________  Time seen: 59   I have reviewed the triage vital signs and the nursing notes.  HISTORY  Chief Complaint  Dental Pain   HPI [name] is a 55 y.o. female presents to the ED for evaluation of dental pain.Patient describes ongoing dental pain that has continued since her visit with [name] about 4 weeks prior. She describes being evaluated by Winfred Leeds and has a list of recommended dental procedures. He was previously prescribed an antibiotic and pain medicine and has completed both. She has concerns for dental infection and claims that she cannot take ibuprofen. He denies any interim fevers, chills, sweats. She also denies any dental injury, gum swelling, drainage, or difficulty swallowing.   Past Medical History:  Diagnosis Date  . Asthma   . Asthma   . Diabetes mellitus   . Fibromyalgia   . Fibromyalgia   . GERD (gastroesophageal reflux disease)   . Hypertension     Patient Active Problem List   Diagnosis Date Noted  . Essential hypertension 05/15/2015  . Fibromyalgia 04/19/2015  . Diabetes (HCC) 02/08/2015    Past Surgical History:  Procedure Laterality Date  . ABDOMINAL HYSTERECTOMY    . ABDOMINAL SURGERY      Prior to Admission medications   Medication Sig Start Date End Date Taking? Authorizing Provider  Albuterol (VENTOLIN IN) Inhale 1 puff into the lungs 2 (two) times daily.    Historical Provider, MD  amoxicillin (AMOXIL) 500 MG tablet Take 1 tablet (500 mg total) by mouth 2 (two) times daily. 11/13/15   Charmayne Sheer Beers, PA-C  aspirin 81 MG tablet Take 81 mg by mouth daily.    Historical Provider, MD  cetirizine (ZYRTEC) 10 MG tablet Take 10 mg by mouth at bedtime.    Historical Provider, MD  citalopram (CELEXA) 40 MG tablet Take 40 mg by mouth daily.    Historical Provider, MD  CRESTOR 20 MG tablet TAKE ONE TABLET BY MOUTH  EVERY DAY 09/28/15   Harle Battiest, PA-C  gabapentin (NEURONTIN) 300 MG capsule Take 300 mg by mouth 4 (four) times daily. 03/08/15   Historical Provider, MD  glipiZIDE (GLUCOTROL) 5 MG tablet Take 1 tablet (5 mg total) by mouth daily before breakfast. 11/07/15   Maple Hudson., MD  hydrOXYzine (ATARAX/VISTARIL) 10 MG tablet Take 1 tablet (10 mg total) by mouth every 6 (six) hours as needed for anxiety. 12/30/14   Minna Antis, MD  lisinopril (PRINIVIL,ZESTRIL) 20 MG tablet Take 10 mg by mouth daily. Cut tab in half 04/19/15   Historical Provider, MD  metFORMIN (GLUCOPHAGE) 500 MG tablet Take by mouth 2 (two) times daily with a meal. 04/19/15   Historical Provider, MD  oxyCODONE-acetaminophen (ROXICET) 5-325 MG tablet Take 1-2 tablets by mouth every 4 (four) hours as needed for severe pain. 11/13/15   Charmayne Sheer Beers, PA-C  penicillin v potassium (VEETID) 500 MG tablet Take 1 tablet (500 mg total) by mouth 4 (four) times daily. 12/14/15   Winifred Balogh V Bacon Ayelen Sciortino, PA-C  promethazine (PHENERGAN) 25 MG tablet Take 1 tablet (25 mg total) by mouth every 6 (six) hours as needed for nausea or vomiting. 09/07/15   Jami L Hagler, PA-C  ranitidine (ZANTAC) 150 MG tablet Take 150 mg by mouth 2 (two) times daily. 04/19/15   Historical Provider, MD  sitaGLIPtin (JANUVIA) 100 MG tablet Take 100 mg by mouth daily.    Historical Provider, MD  tiotropium (SPIRIVA) 18 MCG inhalation capsule Place 1 capsule (18 mcg total) into inhaler and inhale daily. 11/07/15 11/08/15  Maple Hudsonichard L Gilbert Jr., MD    Allergies Benadryl [diphenhydramine hcl]; Bextra [valdecoxib]; and Tramadol  Family History  Problem Relation Age of Onset  . Diabetes Mother   . Hypertension Mother     Social History Social History  Substance Use Topics  . Smoking status: Current Every Day Smoker    Packs/day: 0.50    Types: Cigarettes  . Smokeless tobacco: Current User  . Alcohol use No     Comment: stopped 1 week ago, social drinker     Review of Systems  Constitutional: Negative for fever. Eyes: Negative for visual changes. ENT: Negative for sore throat. Dental pain as above Cardiovascular: Negative for chest pain. Respiratory: Negative for shortness of breath. Gastrointestinal: Negative for abdominal pain, vomiting and diarrhea. Neurological: Negative for headaches, focal weakness or numbness. ____________________________________________  PHYSICAL EXAM:  VITAL SIGNS: ED Triage Vitals  Enc Vitals Group     BP 12/14/15 0909 (!) 138/92     Pulse Rate 12/14/15 0909 84     Resp --      Temp 12/14/15 0909 98.2 F (36.8 C)     Temp Source 12/14/15 0909 Oral     SpO2 12/14/15 0909 95 %     Weight 12/14/15 0910 138 lb (62.6 kg)     Height 12/14/15 0910 4\' 7"  (1.397 m)     Head Circumference --      Peak Flow --      Pain Score 12/14/15 0910 10     Pain Loc --      Pain Edu? --      Excl. in GC? --    Constitutional: Alert and oriented. Well appearing and in no distress. Head: Normocephalic and atraumatic.      Eyes: Conjunctivae are normal. PERRL. Normal extraocular movements      Ears: Canals clear. TMs intact bilaterally.   Nose: No congestion/rhinorrhea.   Mouth/Throat: Mucous membranes are moist. Uvula is midline and tonsils flat. Patient without any obvious focal mucosal swelling, fluctuance, pointing, or sublingual inflammation. She had generalized dental caries without any acute dental fracture patient.   Neck: Supple. No thyromegaly. Hematological/Lymphatic/Immunological: No cervical lymphadenopathy. Cardiovascular: Normal rate, regular rhythm.  Respiratory: Normal respiratory effort. No wheezes/rales/rhonchi. ____________________________________________  INITIAL IMPRESSION / ASSESSMENT AND PLAN / ED COURSE  Patient with ongoing dental pain secondary to underlying dental caries. There is no obvious dental abscess presentation at this time however we will discharge patient with a  prescription for Pen-Vee K to dose prophylactically for dental infection. She will follow-up with [facility], one of the local dental clinics on the list provided. She take over-the-counter Tylenol for dental pain.  Clinical Course   ____________________________________________  FINAL CLINICAL IMPRESSION(S) / ED DIAGNOSES  Final diagnoses:  Pain, dental  Dental caries  Dental infection      Lissa HoardJenise V Bacon Tehila Sokolow, PA-C 12/14/15 1118    Jene Everyobert Kinner, MD 12/14/15 1244

## 2015-12-14 NOTE — ED Triage Notes (Signed)
Pt reports dental pain for awhile. Pt reports was seen here before for the same and needs another antibiotic and pain medication. Pt reports she can not take ibuprofen.

## 2015-12-21 ENCOUNTER — Other Ambulatory Visit: Payer: Self-pay

## 2015-12-21 DIAGNOSIS — E119 Type 2 diabetes mellitus without complications: Secondary | ICD-10-CM

## 2015-12-22 LAB — COMPREHENSIVE METABOLIC PANEL
A/G RATIO: 1.6 (ref 1.2–2.2)
ALBUMIN: 4.4 g/dL (ref 3.5–5.5)
ALT: 22 IU/L (ref 0–32)
AST: 10 IU/L (ref 0–40)
Alkaline Phosphatase: 70 IU/L (ref 39–117)
BILIRUBIN TOTAL: 0.2 mg/dL (ref 0.0–1.2)
BUN/Creatinine Ratio: 20 (ref 9–23)
BUN: 14 mg/dL (ref 6–24)
CO2: 25 mmol/L (ref 18–29)
Calcium: 10.7 mg/dL — ABNORMAL HIGH (ref 8.7–10.2)
Chloride: 100 mmol/L (ref 96–106)
Creatinine, Ser: 0.7 mg/dL (ref 0.57–1.00)
GFR calc Af Amer: 113 mL/min/{1.73_m2} (ref >59)
GFR calc non Af Amer: 98 mL/min/{1.73_m2} (ref >59)
GLUCOSE: 148 mg/dL — AB (ref 65–99)
Globulin, Total: 2.8 g/dL (ref 1.5–4.5)
Potassium: 4.5 mmol/L (ref 3.5–5.2)
Sodium: 141 mmol/L (ref 134–144)
Total Protein: 7.2 g/dL (ref 6.0–8.5)

## 2015-12-22 LAB — HEMOGLOBIN A1C
ESTIMATED AVERAGE GLUCOSE: 235 mg/dL
Hgb A1c MFr Bld: 9.8 % — ABNORMAL HIGH (ref 4.8–5.6)

## 2015-12-26 ENCOUNTER — Other Ambulatory Visit: Payer: Self-pay | Admitting: Internal Medicine

## 2015-12-28 ENCOUNTER — Ambulatory Visit: Payer: Self-pay | Admitting: Pharmacist

## 2015-12-28 ENCOUNTER — Encounter (INDEPENDENT_AMBULATORY_CARE_PROVIDER_SITE_OTHER): Payer: Self-pay

## 2015-12-28 DIAGNOSIS — Z79899 Other long term (current) drug therapy: Secondary | ICD-10-CM

## 2015-12-28 NOTE — Progress Notes (Signed)
Objective of visit today was to reconcile meds and correctly load pill organizer. Objective completed. 

## 2016-01-02 ENCOUNTER — Ambulatory Visit: Payer: Self-pay | Admitting: Adult Health Nurse Practitioner

## 2016-01-02 VITALS — BP 108/67 | HR 96 | Temp 98.4°F | Wt 139.0 lb

## 2016-01-02 DIAGNOSIS — I10 Essential (primary) hypertension: Secondary | ICD-10-CM

## 2016-01-02 DIAGNOSIS — J449 Chronic obstructive pulmonary disease, unspecified: Secondary | ICD-10-CM | POA: Insufficient documentation

## 2016-01-02 DIAGNOSIS — Z794 Long term (current) use of insulin: Principal | ICD-10-CM

## 2016-01-02 DIAGNOSIS — E119 Type 2 diabetes mellitus without complications: Secondary | ICD-10-CM

## 2016-01-02 LAB — GLUCOSE, POCT (MANUAL RESULT ENTRY): POC GLUCOSE: 130 mg/dL — AB (ref 70–99)

## 2016-01-02 MED ORDER — SITAGLIPTIN PHOSPHATE 100 MG PO TABS: 100.0000 mg | ORAL_TABLET | Freq: Every day | ORAL | 1 refills | Status: DC

## 2016-01-02 MED ORDER — GABAPENTIN 300 MG PO CAPS: ORAL_CAPSULE | ORAL | 1 refills | Status: DC

## 2016-01-02 MED ORDER — PROMETHAZINE HCL 25 MG PO TABS: 25.0000 mg | ORAL_TABLET | Freq: Four times a day (QID) | ORAL | 0 refills | Status: DC | PRN

## 2016-01-02 NOTE — Progress Notes (Signed)
Patient: [name] Female    DOB: [birth year]   55 y.o.   MRN: [id number] Visit Date: 01/02/2016  [facility]'s Provider: [facility]   Chief Complaint  Patient presents with  . Follow-up    labwork   . Nausea    x 1 yr    Subjective:    HPI   DM:  Last A1C 9.8.  Reports compliance with medications.  Does not check sugar at home. States she does not like to prick her finger.  States her feet "burn"   HTN:  BP good today.  Taking medications as directed.  Not taking BP at home.  Denies low salt diet.   COPD:  Not using Spiriva yet due to not having it at the pharmacy.  Reports mild SOB at rest and on exertion.  Still smoking 1/2 ppd.  Uses albuterol 3-4 x daily with moderate relief.      Allergies  Allergen Reactions  . Benadryl [Diphenhydramine Hcl] Itching  . Bextra [Valdecoxib] Itching  . Tramadol Itching    Headaches and confusion.   Previous Medications   ALBUTEROL (VENTOLIN IN)    Inhale 1 puff into the lungs 2 (two) times daily.   AMOXICILLIN (AMOXIL) 500 MG TABLET    Take 1 tablet (500 mg total) by mouth 2 (two) times daily.   ASPIRIN 81 MG TABLET    Take 81 mg by mouth daily.   CETIRIZINE (ZYRTEC) 10 MG TABLET    TAKE ONE TABLET EVERY DAY   CITALOPRAM (CELEXA) 40 MG TABLET    Take 40 mg by mouth daily.   CRESTOR 20 MG TABLET    TAKE ONE TABLET BY MOUTH EVERY DAY   GABAPENTIN (NEURONTIN) 300 MG CAPSULE    Take 300 mg by mouth 4 (four) times daily.   GLIPIZIDE (GLUCOTROL) 5 MG TABLET    Take 1 tablet (5 mg total) by mouth daily before breakfast.   HYDROXYZINE (ATARAX/VISTARIL) 10 MG TABLET    Take 1 tablet (10 mg total) by mouth every 6 (six) hours as needed for anxiety.   LISINOPRIL (PRINIVIL,ZESTRIL) 20 MG TABLET    Take 10 mg by mouth daily. Cut tab in half   METFORMIN (GLUCOPHAGE) 500 MG TABLET    Take by mouth 2 (two) times daily with a meal.   OXYCODONE-ACETAMINOPHEN (ROXICET) 5-325 MG TABLET    Take 1-2 tablets by mouth every 4  (four) hours as needed for severe pain.   PENICILLIN V POTASSIUM (VEETID) 500 MG TABLET    Take 1 tablet (500 mg total) by mouth 4 (four) times daily.   PROMETHAZINE (PHENERGAN) 25 MG TABLET    Take 1 tablet (25 mg total) by mouth every 6 (six) hours as needed for nausea or vomiting.   RANITIDINE (ZANTAC) 150 MG TABLET    Take 150 mg by mouth 2 (two) times daily.   SITAGLIPTIN (JANUVIA) 100 MG TABLET    Take 100 mg by mouth daily.   TIOTROPIUM (SPIRIVA) 18 MCG INHALATION CAPSULE    Place 1 capsule (18 mcg total) into inhaler and inhale daily.    Review of Systems  All other systems reviewed and are negative.   Social History  Substance Use Topics  . Smoking status: Current Every Day Smoker    Packs/day: 0.50    Types: Cigarettes  . Smokeless tobacco: Current User  . Alcohol use No     Comment: stopped 1 week ago, social drinker   Objective:   BP 108/67 (  BP Location: Left Arm, Patient Position: Sitting)   Pulse 96   Temp 98.4 F (36.9 C)   Wt 139 lb (63 kg)   BMI 32.31 kg/m   Physical Exam  Constitutional: She is oriented to person, place, and time. She appears well-developed and well-nourished.  HENT:  Head: Normocephalic and atraumatic.  Neck: Normal range of motion.  Cardiovascular: Normal rate and regular rhythm.   Pulmonary/Chest: Effort normal and breath sounds normal.  Abdominal: Soft. Bowel sounds are normal. She exhibits no distension and no mass. There is no tenderness. There is no guarding.  Musculoskeletal: Normal range of motion.  Neurological: She is alert and oriented to person, place, and time.  Skin: Skin is warm and dry.        Assessment & Plan:        COPD:  Smoking cessation.  Continue Albuterol.  Use Spiriva when available.   DM:  A1C 9.8.  Not controlled at <7.  Encouraged diet changes.   Off crestor due to cost.  LDL 38 in June. -check on next labs.  Neuropathy- Increase gabapentin to 600 TID.   HTN:  Well controlled.  Encouraged  low sodium diet.  Continue current regimen.   NAUSEA:  Will refill phenergan.   3 month F/U for labs and  DM, COPD, HTN.       [facility]   [facility]

## 2016-01-02 NOTE — Patient Instructions (Signed)
FU in 3 months for labs and DM, HTN check.

## 2016-02-09 ENCOUNTER — Telehealth: Payer: Self-pay

## 2016-02-09 NOTE — Telephone Encounter (Signed)
Pt called in and left message stating she went to a doctor appt last week and her bp has been running high one she gave me in the message was 171/??. She said to have one called her back and I did so. And did not get an answer but left msg for pt to call me back.

## 2016-02-12 ENCOUNTER — Telehealth: Payer: Self-pay

## 2016-02-12 NOTE — Telephone Encounter (Signed)
Called pt to check on bp. Pt said she still having headaches and some chest pain. I made pt appt for nov 30th. I also advised pt to seek medical help at the ER if symptoms get worse. Pt verbalized understanding.

## 2016-02-22 ENCOUNTER — Encounter: Payer: Self-pay | Admitting: Adult Health Nurse Practitioner

## 2016-02-22 VITALS — BP 140/97 | HR 88 | Ht <= 58 in | Wt 142.0 lb

## 2016-02-22 DIAGNOSIS — I10 Essential (primary) hypertension: Secondary | ICD-10-CM

## 2016-02-22 DIAGNOSIS — M797 Fibromyalgia: Secondary | ICD-10-CM

## 2016-02-22 DIAGNOSIS — J449 Chronic obstructive pulmonary disease, unspecified: Secondary | ICD-10-CM

## 2016-02-22 MED ORDER — NICOTINE 10 MG IN INHA: 1.0000 | RESPIRATORY_TRACT | 0 refills | Status: DC | PRN

## 2016-02-22 MED ORDER — GUAIFENESIN 100 MG/5ML PO SOLN: 5.0000 mL | ORAL | 0 refills | Status: DC | PRN

## 2016-02-22 NOTE — Progress Notes (Signed)
Patient: Erika MiniumJanice R Alaniz Female    DOB: 17-May-1960   55 y.o.   MRN: [id number] Visit Date: 02/22/2016  Today's Provider: Jacelyn Pieah Doles-Johnson, NP   Chief Complaint  Patient presents with  . Follow-up    interested in smoking cessation, HTN, leg pain, headache  . Diabetes    BG: 114 mg/dl   Subjective:    HPI  Pt states her legs hurt today due to her walking a lot secondary to her fibromyalgia.  Sweeping and moping today.  Taking gabapentin 600mg  TID.    Pt states that she was taking 1/2 lisinopril and now she has resumes a whole tablet due to her BP running high.  Bp is still elevated on visit today.  Pt states that she was having some headaches that are relieved with Tylenol.  Last BP reading was good at 108/67. Denies CP, dizziness.   Pt states that she has started the Spiriva.  Interested in quitting smoking.  Smoking 1/2 ppd.  Having a cough with no production.      Allergies  Allergen Reactions  . Benadryl [Diphenhydramine Hcl] Itching  . Bextra [Valdecoxib] Itching  . Tramadol Itching    Headaches and confusion.   Previous Medications   ALBUTEROL (VENTOLIN IN)    Inhale 1 puff into the lungs 2 (two) times daily.   AMOXICILLIN (AMOXIL) 500 MG TABLET    Take 1 tablet (500 mg total) by mouth 2 (two) times daily.   ASPIRIN 81 MG TABLET    Take 81 mg by mouth daily.   CETIRIZINE (ZYRTEC) 10 MG TABLET    TAKE ONE TABLET EVERY DAY   CITALOPRAM (CELEXA) 40 MG TABLET    Take 40 mg by mouth daily.   CRESTOR 20 MG TABLET    TAKE ONE TABLET BY MOUTH EVERY DAY   GABAPENTIN (NEURONTIN) 300 MG CAPSULE    Take 2 capsules by mouth three times daily.   GLIPIZIDE (GLUCOTROL) 5 MG TABLET    Take 1 tablet (5 mg total) by mouth daily before breakfast.   HYDROXYZINE (ATARAX/VISTARIL) 10 MG TABLET    Take 1 tablet (10 mg total) by mouth every 6 (six) hours as needed for anxiety.   LISINOPRIL (PRINIVIL,ZESTRIL) 20 MG TABLET    Take 10 mg by mouth daily. Cut tab in half   METFORMIN  (GLUCOPHAGE) 500 MG TABLET    Take by mouth 2 (two) times daily with a meal.   OXYCODONE-ACETAMINOPHEN (ROXICET) 5-325 MG TABLET    Take 1-2 tablets by mouth every 4 (four) hours as needed for severe pain.   PENICILLIN V POTASSIUM (VEETID) 500 MG TABLET    Take 1 tablet (500 mg total) by mouth 4 (four) times daily.   PROMETHAZINE (PHENERGAN) 25 MG TABLET    Take 1 tablet (25 mg total) by mouth every 6 (six) hours as needed for nausea or vomiting.   RANITIDINE (ZANTAC) 150 MG TABLET    Take 150 mg by mouth 2 (two) times daily.   SITAGLIPTIN (JANUVIA) 100 MG TABLET    Take 1 tablet (100 mg total) by mouth daily.   TIOTROPIUM (SPIRIVA) 18 MCG INHALATION CAPSULE    Place 1 capsule (18 mcg total) into inhaler and inhale daily.    Review of Systems  All other systems reviewed and are negative.   Social History  Substance Use Topics  . Smoking status: Current Every Day Smoker    Packs/day: 0.50    Types: Cigarettes  . Smokeless tobacco: Current User  .  Alcohol use No     Comment: stopped 1 week ago, social drinker   Objective:   BP (!) 140/97   Pulse 88   Ht 4\' 7"  (1.397 m)   Wt 142 lb (64.4 kg)   LMP  (LMP Unknown) Comment: "many years"  SpO2 92%   BMI 33.00 kg/m   Physical Exam  Constitutional: She is oriented to person, place, and time. She appears well-developed and well-nourished.  HENT:  Head: Normocephalic and atraumatic.  Eyes: Pupils are equal, round, and reactive to light.  Neck: Normal range of motion. Neck supple.  Cardiovascular: Normal rate, regular rhythm and normal heart sounds.   Pulmonary/Chest: Effort normal and breath sounds normal.  Nonproductive cough-strong.  Abdominal: Soft. Bowel sounds are normal.  Neurological: She is alert and oriented to person, place, and time.  Skin: Skin is warm and dry.  Psychiatric: She has a normal mood and affect.  Vitals reviewed.       Assessment & Plan:       HTN:  BP elevated.  Continue Lisinopril 20mg  daily.   Goal BP <130/80.  Low sodium diet.      COPD:  Continue Spiriva.  Use Guaifenesin as needed every 4 hours.  Given ludens samples  Rx for nicotrol inhaler sent.   Fibromyalgia:  Continue Gabapentin.  Rest in between house.   FU in 1 month for BP check.   Jacelyn Pieah Doles-Johnson, NP   [facility] [place]

## 2016-02-22 NOTE — Progress Notes (Signed)
error    This encounter was created in error - please disregard.

## 2016-02-26 ENCOUNTER — Other Ambulatory Visit: Payer: Self-pay | Admitting: Urology

## 2016-03-21 ENCOUNTER — Ambulatory Visit: Payer: Self-pay | Admitting: Nurse Practitioner

## 2016-03-21 VITALS — BP 137/94 | HR 81 | Temp 97.6°F | Wt 140.0 lb

## 2016-03-21 DIAGNOSIS — E118 Type 2 diabetes mellitus with unspecified complications: Secondary | ICD-10-CM

## 2016-03-21 DIAGNOSIS — M5432 Sciatica, left side: Secondary | ICD-10-CM

## 2016-03-21 LAB — POCT GLUCOSE (DEVICE FOR HOME USE): Glucose Fasting, POC: 116 mg/dL — AB (ref 70–99)

## 2016-03-21 MED ORDER — CYCLOBENZAPRINE HCL 10 MG PO TABS: 10.0000 mg | ORAL_TABLET | Freq: Three times a day (TID) | ORAL | 0 refills | Status: DC

## 2016-03-21 MED ORDER — PROMETHAZINE HCL 25 MG PO TABS: 25.0000 mg | ORAL_TABLET | Freq: Four times a day (QID) | ORAL | 0 refills | Status: DC | PRN

## 2016-03-21 NOTE — Progress Notes (Signed)
Here today with her peer support, in recovery, cocaine and etoh, clean x 4   Continues to smoke 1/2 ppd  diffiuclty staying asleep  Last a1c in sept 9.  Has been drinking juice  Is getting labs drawn in January  Biggest concern is pain in L lower back, radiating down L posterior leg.  Lives by herself  Limited activity in her apartment   Exam:    Alert, verbally appropriate, in no acute distress Carotid bruits not present No thyromegaly, no adenopathy, breath sounds essentially clear AP RRR L lower back tender to palpation  Plan:  Check blood sugars each evening at approx 8 pm, and pt is to keep record and bring to next clinic appointment Have asked to keep a dietary log for at least 2-3/7 including all fluids.    Tobacco abuse has cartridges and gum but does not really want to quit  Pt is to return to clinic aftet labs  To review findings and assess thearpy  Will allow small amount of flexeril  Nausea, will renew phenergan

## 2016-03-22 ENCOUNTER — Other Ambulatory Visit: Payer: Self-pay | Admitting: Adult Health Nurse Practitioner

## 2016-04-02 ENCOUNTER — Ambulatory Visit: Payer: Self-pay

## 2016-04-03 ENCOUNTER — Other Ambulatory Visit: Payer: Self-pay | Admitting: Nurse Practitioner

## 2016-04-09 ENCOUNTER — Ambulatory Visit: Payer: Self-pay

## 2016-04-11 ENCOUNTER — Ambulatory Visit: Payer: Self-pay

## 2016-04-18 ENCOUNTER — Other Ambulatory Visit: Payer: Self-pay | Admitting: Internal Medicine

## 2016-04-18 ENCOUNTER — Other Ambulatory Visit: Payer: Self-pay | Admitting: Urology

## 2016-04-18 NOTE — Telephone Encounter (Signed)
[facility] PAP [facility] from [facility] for Ventolin HFA 90 mcg placed for provider to sign.

## 2016-04-25 ENCOUNTER — Ambulatory Visit: Payer: Self-pay | Admitting: Nurse Practitioner

## 2016-04-25 VITALS — BP 112/78 | HR 95 | Temp 97.6°F | Wt 140.4 lb

## 2016-04-25 DIAGNOSIS — E119 Type 2 diabetes mellitus without complications: Secondary | ICD-10-CM

## 2016-04-25 DIAGNOSIS — R110 Nausea: Secondary | ICD-10-CM

## 2016-04-25 LAB — GLUCOSE, POCT (MANUAL RESULT ENTRY): POC Glucose: 138 mg/dl — AB (ref 70–99)

## 2016-04-25 MED ORDER — PROMETHAZINE HCL 25 MG PO TABS: 25.0000 mg | ORAL_TABLET | Freq: Four times a day (QID) | ORAL | 0 refills | Status: DC | PRN

## 2016-04-25 MED ORDER — GLIPIZIDE 5 MG PO TABS: ORAL_TABLET | ORAL | 2 refills | Status: DC

## 2016-04-25 MED ORDER — CYCLOBENZAPRINE HCL 10 MG PO TABS: 10.0000 mg | ORAL_TABLET | Freq: Three times a day (TID) | ORAL | 0 refills | Status: DC

## 2016-04-25 MED ORDER — RANITIDINE HCL 150 MG PO TABS: 150.0000 mg | ORAL_TABLET | Freq: Two times a day (BID) | ORAL | 3 refills | Status: DC

## 2016-04-25 NOTE — Progress Notes (Signed)
Was told she had sciatica, was given a limited of flexeril, which does help.    Has drastically changed her diet, decreased starches, and almost completely stopped sweets  Drinks sprite 0 and water States she Is experiencing shakiness, wonders re:  Hypoglycemia   states she has nausea all day long  Continues to smoke 1/2 ppd  Exam:    Alert verbally appropriate in NAD  No carotid bruits No thyromegaly, no adenopathy AP RRR BBrS MARKEDLY DECREASED AND WHEEZES THROUGHOUT NO SIGNIFICANT LOWER EXTREMITY EDEMA  Plan:    Pt instructed she need to check her blood sugars fasting, that she does not need to wait until 8 a.m., but may check upon first awakening  Have asked her to please check sugar 2 hours after eating  Will decrease her glipizide to 2.5 mg each day, if having hypoglycemia, glipizide is the culprit.    She has ordered labs and they need to be drawn asap, before any other changes are made to her med regimen  WHEEZING NEEDS TO IUSE INHALER AS NEEDED Sciatica, will renew flexeril  Persistent nausea, will renew phenergan and will receive additional care from new provider  She has multiple labs that are overdue, they need to be done asap  Pt now has medicaid and we need to assist in finding a primary care provider  We can see for 30 more days only

## 2016-04-30 ENCOUNTER — Other Ambulatory Visit: Payer: Self-pay

## 2016-05-02 ENCOUNTER — Ambulatory Visit: Payer: Self-pay

## 2016-05-02 ENCOUNTER — Other Ambulatory Visit: Payer: Self-pay

## 2016-05-02 DIAGNOSIS — Z794 Long term (current) use of insulin: Principal | ICD-10-CM

## 2016-05-02 DIAGNOSIS — I10 Essential (primary) hypertension: Secondary | ICD-10-CM

## 2016-05-02 DIAGNOSIS — E119 Type 2 diabetes mellitus without complications: Secondary | ICD-10-CM

## 2016-05-03 LAB — COMPREHENSIVE METABOLIC PANEL
ALBUMIN: 4.5 g/dL (ref 3.5–5.5)
ALT: 29 IU/L (ref 0–32)
AST: 11 IU/L (ref 0–40)
Albumin/Globulin Ratio: 1.6 (ref 1.2–2.2)
Alkaline Phosphatase: 82 IU/L (ref 39–117)
BUN / CREAT RATIO: 13 (ref 9–23)
BUN: 9 mg/dL (ref 6–24)
Bilirubin Total: 0.2 mg/dL (ref 0.0–1.2)
CALCIUM: 10.3 mg/dL — AB (ref 8.7–10.2)
CO2: 27 mmol/L (ref 18–29)
CREATININE: 0.69 mg/dL (ref 0.57–1.00)
Chloride: 100 mmol/L (ref 96–106)
GFR, EST AFRICAN AMERICAN: 113 mL/min/{1.73_m2} (ref >59)
GFR, EST NON AFRICAN AMERICAN: 98 mL/min/{1.73_m2} (ref >59)
GLUCOSE: 151 mg/dL — AB (ref 65–99)
Globulin, Total: 2.8 g/dL (ref 1.5–4.5)
Potassium: 4.3 mmol/L (ref 3.5–5.2)
Sodium: 142 mmol/L (ref 134–144)
TOTAL PROTEIN: 7.3 g/dL (ref 6.0–8.5)

## 2016-05-03 LAB — HEMOGLOBIN A1C
ESTIMATED AVERAGE GLUCOSE: 252 mg/dL
HEMOGLOBIN A1C: 10.4 % — AB (ref 4.8–5.6)

## 2016-05-03 LAB — MICROALBUMIN / CREATININE URINE RATIO
Creatinine, Urine: 94 mg/dL
Microalb/Creat Ratio: 143.8 mg/g creat — ABNORMAL HIGH (ref 0.0–30.0)
Microalbumin, Urine: 135.2 ug/mL

## 2016-05-03 LAB — LIPID PANEL
CHOLESTEROL TOTAL: 111 mg/dL (ref 100–199)
Chol/HDL Ratio: 3.6 ratio units (ref 0.0–4.4)
HDL: 31 mg/dL — ABNORMAL LOW (ref >39)
LDL CALC: 41 mg/dL (ref 0–99)
Triglycerides: 193 mg/dL — ABNORMAL HIGH (ref 0–149)
VLDL CHOLESTEROL CAL: 39 mg/dL (ref 5–40)

## 2016-05-16 ENCOUNTER — Ambulatory Visit: Payer: Self-pay

## 2016-05-29 DIAGNOSIS — E119 Type 2 diabetes mellitus without complications: Secondary | ICD-10-CM | POA: Insufficient documentation

## 2016-05-29 DIAGNOSIS — E782 Mixed hyperlipidemia: Secondary | ICD-10-CM | POA: Insufficient documentation

## 2016-08-26 ENCOUNTER — Ambulatory Visit: Payer: Medicaid Other | Admitting: Physical Therapy

## 2016-08-27 ENCOUNTER — Encounter: Payer: Medicaid Other | Attending: Family Medicine | Admitting: Dietician

## 2016-08-27 ENCOUNTER — Encounter: Payer: Self-pay | Admitting: Dietician

## 2016-08-27 VITALS — BP 134/96 | Ht <= 58 in | Wt 139.9 lb

## 2016-08-27 DIAGNOSIS — E119 Type 2 diabetes mellitus without complications: Secondary | ICD-10-CM | POA: Insufficient documentation

## 2016-08-27 DIAGNOSIS — Z713 Dietary counseling and surveillance: Secondary | ICD-10-CM | POA: Insufficient documentation

## 2016-08-27 NOTE — Patient Instructions (Signed)
  Check blood sugars 2 x day before breakfast and 2 hrs after supper every day and record Bring blood sugar records to the next appointment/class Call your doctor for a prescription for:  1. Meter strips (type): Accuchek Guide test strips checking  2 times per day  2. Lancets (type): Accuchek Fast Clix lancets checking 2  times per day Exercise:  Try walking 10 min. 3x/wk if able  Eat 3 meals day,  1 snack a day at bedtime Eat 2-3 carbohydrate servings/meal + protein Eat 1 carbohydrate serving/snack + protein Space meals 4-5 hours apart Avoid sugar sweetened drinks (soda, tea, coffee, sports drinks, juices) Limit intake of sweets/desserts & fried foods Quit / Decrease smoking Get a Midwifeharps container Carry fast acting glucose and a snack at all times Carry medical alert ID Take Glucotrol and Metformin at breakfast and supper Rotate injection sites Return for appointment/classes on: 10-03-16

## 2016-08-27 NOTE — Progress Notes (Signed)
Diabetes Self-Management Education  Visit Type: First/Initial  Appt. Start Time: 1015 Appt. End Time: 1130  08/27/2016  [name], identified by name and date of birth, is a 56 y.o. female with a diagnosis of Diabetes: Type 2.   ASSESSMENT  Blood pressure (!) 134/96, height 4\' 7"  (1.397 m), weight 139 lb 14.4 oz (63.5 kg). Body mass index is 32.52 kg/m. Smokes 1/2 ppd     Diabetes Self-Management Education - 08/27/16 1646      Visit Information   Visit Type First/Initial     Initial Visit   Diabetes Type Type 2     Health Coping   How would you rate your overall health? Poor     Psychosocial Assessment   Patient Belief/Attitude about Diabetes Motivated to manage diabetes   Self-care barriers None  some difficulty concentrating on information given at times   Self-management support Doctor's office   Other persons present Patient   Patient Concerns Glycemic Control;Medication;Weight Control;Healthy Lifestyle  become more fit   Special Needs None   Preferred Learning Style Hands on   Learning Readiness Ready   What is the last grade level you completed in school? 10     Pre-Education Assessment   Patient understands the diabetes disease and treatment process. Needs Review   Patient understands incorporating nutritional management into lifestyle. Needs Review   Patient undertands incorporating physical activity into lifestyle. Needs Review   Patient understands using medications safely. Needs Review  takes PM doses of Glipizide and Metformin at bedtime   Patient understands monitoring blood glucose, interpreting and using results Needs Review   Patient understands prevention, detection, and treatment of acute complications. Needs Review   Patient understands prevention, detection, and treatment of chronic complications. Needs Review   Patient understands how to develop strategies to address psychosocial issues. Needs Review   Patient understands how to  develop strategies to promote health/change behavior. Needs Review     Complications   Last HgB A1C per patient/outside source 10.4 %  05-02-2016   How often do you check your blood sugar? 0 times/day (not testing)   Have you had a dilated eye exam in the past 12 months? Yes  9 months ago   Have you had a dental exam in the past 12 months? Yes  4 months ago   Are you checking your feet? Yes   How many days per week are you checking your feet? 4     Dietary Intake   Breakfast --  eats breakfast at 11am (awakens at 5am)   Snack (morning) --  none   Lunch --  occasionally skips lunch   Snack (afternoon) --  eats cheese and crackers or honeybun at [employer]   Dinner --  eats supper at 5p; eats fried foods and snack foods 2-3x/wk; eats sweets daily   Snack (evening) --  eats cheese and crackers or honeybun at 7p   Beverage(s) --  drinks water 2-3x/day, sweet beverages 2-3x/day, fruit juice 2x/day, sugar free drinks 4-5x/day     Exercise   Exercise Type ADL's  limited due to pain all over     Patient Education   Previous Diabetes Education No   Disease state  Definition of diabetes, type 1 and 2, and the diagnosis of diabetes;Explored patient's options for treatment of their diabetes   Nutrition management  Role of diet in the treatment of diabetes and the relationship between the three main macronutrients and blood glucose level;Food label reading, portion sizes  and measuring food.;Carbohydrate counting   Physical activity and exercise  Role of exercise on diabetes management, blood pressure control and cardiac health.;Helped patient identify appropriate exercises in relation to his/her diabetes, diabetes complications and other health issue.   Medications Reviewed patients medication for diabetes, action, purpose, timing of dose and side effects.;Taught/reviewed insulin injection, site rotation, insulin storage and needle disposal.   Monitoring Taught/evaluated SMBG meter.;Purpose and  frequency of SMBG.;Taught/discussed recording of test results and interpretation of SMBG.;Identified appropriate SMBG and/or A1C goals.;Yearly dilated eye exam  gave pt Accuchek Guide meter and instructed on its use-FBG 208   Acute complications Taught treatment of hypoglycemia - the 15 rule.   Chronic complications Relationship between chronic complications and blood glucose control;Lipid levels, blood glucose control and heart disease;Identified and discussed with patient  current chronic complications;Dental care;Retinopathy and reason for yearly dilated eye exams;Nephropathy, what it is, prevention of, the use of ACE, ARB's and early detection of through urine microalbumia.;Reviewed with patient heart disease, higher risk of, and prevention;Assessed and discussed foot care and prevention of foot problems   Personal strategies to promote health Lifestyle issues that need to be addressed for better diabetes care;Helped patient develop diabetes management plan for (enter comment)     Outcomes   Expected Outcomes Demonstrated interest in learning. Expect positive outcomes      Individualized Plan for Diabetes Self-Management Training:   Learning Objective:  Patient will have a greater understanding of diabetes self-management. Patient education plan is to attend individual and/or group sessions per assessed needs and concerns.   Plan:   Patient Instructions   Check blood sugars 2 x day before breakfast and 2 hrs after supper every day and record Bring blood sugar records to the next appointment/class Call your doctor for a prescription for:  1. Meter strips (type): Accuchek Guide test strips checking  2 times per day  2. Lancets (type): Accuchek Fast Clix lancets checking 2  times per day Exercise:  Try walking 10 min. 3x/wk if able  Eat 3 meals day,  1 snack a day at bedtime Eat 2-3 carbohydrate servings/meal + protein Eat 1 carbohydrate serving/snack + protein Space meals 4-5 hours  apart Avoid sugar sweetened drinks (soda, tea, coffee, sports drinks, juices) Limit intake of sweets/desserts & fried foods Quit / Decrease smoking Get a Midwife fast acting glucose and a snack at all times Carry medical alert ID Take Glucotrol and Metformin at breakfast and supper Rotate injection sites Return for appointment/classes on: 10-03-16   Expected Outcomes:  Demonstrated interest in learning. Expect positive outcomes  Education material provided: General meal planning guidelines, 1 tube sample glucose tablets, medical alert ID card, Quit Smoking class information, Accuchek Guide meter  If problems or questions, patient to contact team via:  [phone]  Future DSME appointment:  10-03-16

## 2016-08-28 DIAGNOSIS — R011 Cardiac murmur, unspecified: Secondary | ICD-10-CM

## 2016-08-28 HISTORY — DX: Cardiac murmur, unspecified: R01.1

## 2016-08-29 ENCOUNTER — Ambulatory Visit: Payer: Medicaid Other | Attending: Orthopedic Surgery | Admitting: Physical Therapy

## 2016-08-29 ENCOUNTER — Encounter: Payer: Self-pay | Admitting: Physical Therapy

## 2016-08-29 DIAGNOSIS — G8929 Other chronic pain: Secondary | ICD-10-CM | POA: Diagnosis present

## 2016-08-29 DIAGNOSIS — R269 Unspecified abnormalities of gait and mobility: Secondary | ICD-10-CM | POA: Diagnosis present

## 2016-08-29 DIAGNOSIS — M545 Low back pain: Secondary | ICD-10-CM | POA: Diagnosis present

## 2016-08-29 DIAGNOSIS — M6281 Muscle weakness (generalized): Secondary | ICD-10-CM | POA: Insufficient documentation

## 2016-08-29 NOTE — Therapy (Signed)
[facility] [address] [place], [place], [address] Phone: [phone]   Fax:  [phone]  Physical Therapy Evaluation  Patient Details  Name: FATUMA DOWERS MRN: [id number] Date of Birth: 1960-06-09 Referring Provider: Dedra Skeens, PA-C  Encounter Date: 08/29/2016      PT End of Session - 08/29/16 2114    Visit Number 1   Number of Visits 1   Date for PT Re-Evaluation 08/29/16   PT Start Time 1338   PT Stop Time 1431   PT Time Calculation (min) 53 min   Activity Tolerance Patient tolerated treatment well;Patient limited by pain   Behavior During Therapy [facility] for tasks assessed/performed      Past Medical History:  Diagnosis Date  . Asthma   . Asthma   . Diabetes mellitus   . Fibromyalgia   . Fibromyalgia   . GERD (gastroesophageal reflux disease)   . History of degenerative disc disease   . Hyperlipidemia   . Hypertension     Past Surgical History:  Procedure Laterality Date  . ABDOMINAL HYSTERECTOMY    . ABDOMINAL SURGERY      There were no vitals filed for this visit.       Subjective Assessment - 08/29/16 1423    Subjective Pt. reports chronic h/o low back pain.  Pt. states she has DDD and long h/o fibromyalgia.  Pt. c/o 9/10 pain in B knees/ legs currently.  Pt. is not active and reports benefit from sitting/lying down and Hydrocodone.     Limitations Standing;Lifting;Walking;House hold activities   Currently in Pain? Yes   Pain Score 9    Pain Location Back   Pain Orientation Lower;Mid   Pain Descriptors / Indicators Aching   Pain Type Chronic pain   Pain Radiating Towards no c/o radiating at this time   Pain Onset More than a month ago   Pain Frequency Intermittent   Aggravating Factors  increase activity   Pain Relieving Factors rest/ Hydrocodone  (no benefit from Mobic).             [facility] PT Assessment - 08/29/16 0001      Assessment   Medical Diagnosis Low back pain   Referring  Provider Dedra Skeens, PA-C   Onset Date/Surgical Date 03/25/16   Prior Therapy no     Balance Screen   Has the patient fallen in the past 6 months Yes   Has the patient had a decrease in activity level because of a fear of falling?  Yes      See HEP.          PT Education - 08/29/16 1423    Education provided Yes   Education Details Bridging/ Sit to standing/ Supine trunk rotn./ Prone press-up/ Supine knee to chest.     Person(s) Educated Patient   Methods Explanation;Demonstration;Handout   Comprehension Verbalized understanding;Returned demonstration              Plan - 08/29/16 2115    Clinical Impression Statement Pt. is a pleasant 56 y/o with chronic h/o low back pain and fibromyalgia.  Pt. reports several falls at home due to walking/ balance issues.  Pt. c/o 9/10 pain in B knees/legs at this time and discomfort in low back.  Pt. states increase activity with worsen low back pain.  Pt. ambulates with a slow, antalgic gait pattern with no assistive device in clinic.  Pt. walking 10 minutes at a time at home to walk  dog but requires seated breaks due to pain.  (-) SLR.  Good B LE flexibility and B LE muscle strength grossly 4/5 MMT.  Mild low thoracic/ lumbar hypomobility in prone position with tenderness noted.  No LLD.  Standing lumbar AROM WFL (all planes).  Pt. instructed in HEP to increase lumbar mobility and strengthening ex. to improve pain-free mobility.  [facility].     Clinical Decision Making Moderate   Rehab Potential Fair   PT Frequency 1x / week   PT Treatment/Interventions ADLs/Self Care Home Management;Cryotherapy;Moist Heat;Functional mobility training;Therapeutic activities;Therapeutic exercise   PT Next Visit Plan Pt. limitd to 1 visit due to Medicaid restrictions.     PT Home Exercise Plan see handouts.    Consulted and Agree with Plan of Care Patient      Patient will benefit from skilled therapeutic  intervention in order to improve the following deficits and impairments:  Improper body mechanics, Pain, Hypomobility, Decreased strength, Decreased range of motion, Decreased activity tolerance, Decreased endurance, Postural dysfunction, Difficulty walking  Visit Diagnosis: Chronic bilateral low back pain without sciatica  Muscle weakness (generalized)  Gait difficulty     Problem List Patient Active Problem List   Diagnosis Date Noted  . COPD (chronic obstructive pulmonary disease) (HCC) 01/02/2016  . Essential hypertension 05/15/2015  . Fibromyalgia 04/19/2015  . Diabetes (HCC) 02/08/2015   Cammie McgeeMichael C Lynann Demetrius, PT, DPT # [id number]  08/29/2016, 9:21 PM  [facility] [address] [place], [place], [address] Phone: [phone]   Fax:  [phone]  Name: Reynaldo MiniumJanice R Helsley MRN: [id number] Date of Birth: July 19, 1960

## 2016-09-26 ENCOUNTER — Other Ambulatory Visit: Payer: Self-pay | Admitting: Internal Medicine

## 2016-10-03 ENCOUNTER — Encounter: Payer: Self-pay | Admitting: Dietician

## 2016-10-03 ENCOUNTER — Encounter: Payer: Medicaid Other | Attending: Family Medicine | Admitting: Dietician

## 2016-10-03 VITALS — Ht <= 58 in | Wt 135.1 lb

## 2016-10-03 DIAGNOSIS — E119 Type 2 diabetes mellitus without complications: Secondary | ICD-10-CM | POA: Insufficient documentation

## 2016-10-03 DIAGNOSIS — Z713 Dietary counseling and surveillance: Secondary | ICD-10-CM | POA: Diagnosis not present

## 2016-10-03 NOTE — Progress Notes (Signed)
Reviewed BG testing procedure with patient; she performed a BG test on her own with result of 110mg /dl at 09:81XB12:15pm.

## 2016-10-03 NOTE — Progress Notes (Signed)

## 2016-10-04 ENCOUNTER — Other Ambulatory Visit: Payer: Self-pay | Admitting: Gastroenterology

## 2016-10-04 DIAGNOSIS — R1319 Other dysphagia: Secondary | ICD-10-CM

## 2016-10-07 ENCOUNTER — Emergency Department
Admission: EM | Admit: 2016-10-07 | Discharge: 2016-10-07 | Discharge status: Against Medical Advice | Payer: Medicaid Other | Attending: Emergency Medicine | Admitting: Emergency Medicine

## 2016-10-07 ENCOUNTER — Encounter: Payer: Self-pay | Admitting: Emergency Medicine

## 2016-10-07 DIAGNOSIS — R42 Dizziness and giddiness: Secondary | ICD-10-CM | POA: Diagnosis not present

## 2016-10-07 LAB — CBC
HCT: 46.8 % (ref 35.0–47.0)
Hemoglobin: 15.3 g/dL (ref 12.0–16.0)
MCH: 28.8 pg (ref 26.0–34.0)
MCHC: 32.7 g/dL (ref 32.0–36.0)
MCV: 88 fL (ref 80.0–100.0)
PLATELETS: 242 10*3/uL (ref 150–440)
RBC: 5.32 MIL/uL — AB (ref 3.80–5.20)
RDW: 15.9 % — AB (ref 11.5–14.5)
WBC: 9.6 10*3/uL (ref 3.6–11.0)

## 2016-10-07 LAB — BASIC METABOLIC PANEL
Anion gap: 10 (ref 5–15)
BUN: 14 mg/dL (ref 6–20)
CHLORIDE: 105 mmol/L (ref 101–111)
CO2: 24 mmol/L (ref 22–32)
CREATININE: 0.63 mg/dL (ref 0.44–1.00)
Calcium: 10 mg/dL (ref 8.9–10.3)
GFR calc Af Amer: >60 mL/min (ref >60)
GFR calc non Af Amer: >60 mL/min (ref >60)
Glucose, Bld: 179 mg/dL — ABNORMAL HIGH (ref 65–99)
Potassium: 3.9 mmol/L (ref 3.5–5.1)
Sodium: 139 mmol/L (ref 135–145)

## 2016-10-07 NOTE — ED Triage Notes (Signed)
Pt to ed with c/o dizziness for two days.

## 2016-10-08 ENCOUNTER — Telehealth: Payer: Self-pay | Admitting: Dietician

## 2016-10-08 NOTE — Telephone Encounter (Signed)
[name] called to cancel her attendance at Diabetes class 2 on 7/19, due to vertigo. She will come for class 3 on 7/26, and reschedule class 2 at that time.

## 2016-10-09 ENCOUNTER — Ambulatory Visit: Payer: Medicaid Other

## 2016-10-14 ENCOUNTER — Inpatient Hospital Stay
Admission: EM | Admit: 2016-10-14 | Discharge: 2016-10-16 | DRG: 872 | Disposition: A | Discharge status: Home / Self Care | Payer: Medicaid Other | Attending: Internal Medicine | Admitting: Internal Medicine

## 2016-10-14 ENCOUNTER — Emergency Department: Payer: Medicaid Other

## 2016-10-14 ENCOUNTER — Encounter: Payer: Self-pay | Admitting: Emergency Medicine

## 2016-10-14 DIAGNOSIS — Z79899 Other long term (current) drug therapy: Secondary | ICD-10-CM | POA: Diagnosis not present

## 2016-10-14 DIAGNOSIS — Z794 Long term (current) use of insulin: Secondary | ICD-10-CM | POA: Diagnosis not present

## 2016-10-14 DIAGNOSIS — E119 Type 2 diabetes mellitus without complications: Secondary | ICD-10-CM | POA: Diagnosis present

## 2016-10-14 DIAGNOSIS — K5792 Diverticulitis of intestine, part unspecified, without perforation or abscess without bleeding: Secondary | ICD-10-CM | POA: Diagnosis present

## 2016-10-14 DIAGNOSIS — F17210 Nicotine dependence, cigarettes, uncomplicated: Secondary | ICD-10-CM | POA: Diagnosis present

## 2016-10-14 DIAGNOSIS — Z79891 Long term (current) use of opiate analgesic: Secondary | ICD-10-CM | POA: Diagnosis not present

## 2016-10-14 DIAGNOSIS — M797 Fibromyalgia: Secondary | ICD-10-CM | POA: Diagnosis present

## 2016-10-14 DIAGNOSIS — K219 Gastro-esophageal reflux disease without esophagitis: Secondary | ICD-10-CM | POA: Diagnosis present

## 2016-10-14 DIAGNOSIS — J4520 Mild intermittent asthma, uncomplicated: Secondary | ICD-10-CM | POA: Diagnosis present

## 2016-10-14 DIAGNOSIS — K5732 Diverticulitis of large intestine without perforation or abscess without bleeding: Secondary | ICD-10-CM

## 2016-10-14 DIAGNOSIS — E785 Hyperlipidemia, unspecified: Secondary | ICD-10-CM | POA: Diagnosis present

## 2016-10-14 DIAGNOSIS — I10 Essential (primary) hypertension: Secondary | ICD-10-CM | POA: Diagnosis present

## 2016-10-14 DIAGNOSIS — Z7982 Long term (current) use of aspirin: Secondary | ICD-10-CM | POA: Diagnosis not present

## 2016-10-14 DIAGNOSIS — R1032 Left lower quadrant pain: Secondary | ICD-10-CM | POA: Diagnosis present

## 2016-10-14 DIAGNOSIS — A419 Sepsis, unspecified organism: Principal | ICD-10-CM | POA: Diagnosis present

## 2016-10-14 LAB — URINALYSIS, COMPLETE (UACMP) WITH MICROSCOPIC
BACTERIA UA: NONE SEEN
Bilirubin Urine: NEGATIVE
GLUCOSE, UA: NEGATIVE mg/dL
HGB URINE DIPSTICK: NEGATIVE
KETONES UR: NEGATIVE mg/dL
LEUKOCYTES UA: NEGATIVE
NITRITE: NEGATIVE
PH: 7 (ref 5.0–8.0)
PROTEIN: NEGATIVE mg/dL
RBC / HPF: NONE SEEN RBC/hpf (ref 0–5)
Specific Gravity, Urine: 1.021 (ref 1.005–1.030)

## 2016-10-14 LAB — CBC WITH DIFFERENTIAL/PLATELET
BASOS ABS: 0.1 10*3/uL (ref 0–0.1)
BASOS PCT: 1 %
EOS ABS: 0.1 10*3/uL (ref 0–0.7)
Eosinophils Relative: 1 %
HCT: 47.3 % — ABNORMAL HIGH (ref 35.0–47.0)
HEMOGLOBIN: 15.2 g/dL (ref 12.0–16.0)
Lymphocytes Relative: 19 %
Lymphs Abs: 2.9 10*3/uL (ref 1.0–3.6)
MCH: 28.1 pg (ref 26.0–34.0)
MCHC: 32.1 g/dL (ref 32.0–36.0)
MCV: 87.6 fL (ref 80.0–100.0)
MONOS PCT: 7 %
Monocytes Absolute: 1.1 10*3/uL — ABNORMAL HIGH (ref 0.2–0.9)
NEUTROS ABS: 11.3 10*3/uL — AB (ref 1.4–6.5)
NEUTROS PCT: 72 %
Platelets: 251 10*3/uL (ref 150–440)
RBC: 5.4 MIL/uL — AB (ref 3.80–5.20)
RDW: 15.1 % — ABNORMAL HIGH (ref 11.5–14.5)
WBC: 15.4 10*3/uL — AB (ref 3.6–11.0)

## 2016-10-14 LAB — COMPREHENSIVE METABOLIC PANEL
ALT: 16 U/L (ref 14–54)
ANION GAP: 9 (ref 5–15)
AST: 16 U/L (ref 15–41)
Albumin: 4.5 g/dL (ref 3.5–5.0)
Alkaline Phosphatase: 66 U/L (ref 38–126)
BUN: 15 mg/dL (ref 6–20)
CHLORIDE: 102 mmol/L (ref 101–111)
CO2: 24 mmol/L (ref 22–32)
Calcium: 9.8 mg/dL (ref 8.9–10.3)
Creatinine, Ser: 0.81 mg/dL (ref 0.44–1.00)
GFR calc Af Amer: >60 mL/min (ref >60)
Glucose, Bld: 193 mg/dL — ABNORMAL HIGH (ref 65–99)
POTASSIUM: 4.9 mmol/L (ref 3.5–5.1)
Sodium: 135 mmol/L (ref 135–145)
TOTAL PROTEIN: 8.1 g/dL (ref 6.5–8.1)
Total Bilirubin: 0.7 mg/dL (ref 0.3–1.2)

## 2016-10-14 LAB — LACTIC ACID, PLASMA: Lactic Acid, Venous: 1.6 mmol/L (ref 0.5–1.9)

## 2016-10-14 LAB — GLUCOSE, CAPILLARY
GLUCOSE-CAPILLARY: 128 mg/dL — AB (ref 65–99)
GLUCOSE-CAPILLARY: 238 mg/dL — AB (ref 65–99)
GLUCOSE-CAPILLARY: 222 mg/dL — AB (ref 65–99)

## 2016-10-14 LAB — LIPASE, BLOOD: LIPASE: 24 U/L (ref 11–51)

## 2016-10-14 MED ORDER — CIPROFLOXACIN IN D5W 400 MG/200ML IV SOLN
400.0000 mg | Freq: Once | INTRAVENOUS | Status: AC
  Administered 2016-10-14: 12:00:00 400 mg via INTRAVENOUS
  Filled 2016-10-14: qty 200

## 2016-10-14 MED ORDER — MORPHINE SULFATE (PF) 4 MG/ML IV SOLN
INTRAVENOUS | Status: AC
  Filled 2016-10-14: qty 1

## 2016-10-14 MED ORDER — ONDANSETRON HCL 4 MG PO TABS: 4.0000 mg | ORAL_TABLET | Freq: Four times a day (QID) | ORAL | Status: DC | PRN

## 2016-10-14 MED ORDER — ONDANSETRON HCL 4 MG/2ML IJ SOLN
4.0000 mg | Freq: Four times a day (QID) | INTRAMUSCULAR | Status: DC | PRN
  Administered 2016-10-15: 17:00:00 4 mg via INTRAVENOUS
  Filled 2016-10-14: qty 2

## 2016-10-14 MED ORDER — BISACODYL 10 MG RE SUPP: 10.0000 mg | Freq: Every day | RECTAL | Status: DC | PRN

## 2016-10-14 MED ORDER — INSULIN ASPART 100 UNIT/ML ~~LOC~~ SOLN
0.0000 [IU] | Freq: Three times a day (TID) | SUBCUTANEOUS | Status: DC
  Administered 2016-10-14: 3 [IU] via SUBCUTANEOUS
  Administered 2016-10-15: 1 [IU] via SUBCUTANEOUS
  Filled 2016-10-14 (×2): qty 1

## 2016-10-14 MED ORDER — LORATADINE 10 MG PO TABS
10.0000 mg | ORAL_TABLET | Freq: Every day | ORAL | Status: DC
  Administered 2016-10-14 – 2016-10-16 (×3): 10 mg via ORAL
  Filled 2016-10-14 (×3): qty 1

## 2016-10-14 MED ORDER — MELOXICAM 15 MG PO TABS
15.0000 mg | ORAL_TABLET | Freq: Every day | ORAL | Status: DC
  Filled 2016-10-14: qty 1

## 2016-10-14 MED ORDER — GABAPENTIN 300 MG PO CAPS
900.0000 mg | ORAL_CAPSULE | Freq: Three times a day (TID) | ORAL | Status: DC
  Administered 2016-10-16: 900 mg via ORAL
  Filled 2016-10-14 (×3): qty 3

## 2016-10-14 MED ORDER — INSULIN DETEMIR 100 UNIT/ML ~~LOC~~ SOLN
20.0000 [IU] | Freq: Every day | SUBCUTANEOUS | Status: DC
  Administered 2016-10-14 – 2016-10-15 (×2): 20 [IU] via SUBCUTANEOUS
  Filled 2016-10-14 (×3): qty 0.2

## 2016-10-14 MED ORDER — POLYETHYLENE GLYCOL 3350 17 G PO PACK: 17.0000 g | PACK | Freq: Every day | ORAL | Status: DC | PRN

## 2016-10-14 MED ORDER — MORPHINE SULFATE (PF) 4 MG/ML IV SOLN
4.0000 mg | Freq: Once | INTRAVENOUS | Status: AC
  Administered 2016-10-14: 4 mg via INTRAVENOUS

## 2016-10-14 MED ORDER — CITALOPRAM HYDROBROMIDE 20 MG PO TABS
40.0000 mg | ORAL_TABLET | Freq: Every day | ORAL | Status: DC
  Administered 2016-10-14 – 2016-10-16 (×3): 40 mg via ORAL
  Filled 2016-10-14 (×3): qty 2

## 2016-10-14 MED ORDER — METRONIDAZOLE IN NACL 5-0.79 MG/ML-% IV SOLN
500.0000 mg | Freq: Three times a day (TID) | INTRAVENOUS | Status: DC
  Administered 2016-10-14 – 2016-10-16 (×5): 500 mg via INTRAVENOUS
  Filled 2016-10-14 (×8): qty 100

## 2016-10-14 MED ORDER — MELOXICAM 15 MG PO TABS
15.0000 mg | ORAL_TABLET | Freq: Every day | ORAL | Status: DC
  Administered 2016-10-14 – 2016-10-16 (×3): 15 mg via ORAL
  Filled 2016-10-14 (×3): qty 1

## 2016-10-14 MED ORDER — FLEET ENEMA 7-19 GM/118ML RE ENEM: 1.0000 | ENEMA | Freq: Once | RECTAL | Status: DC | PRN

## 2016-10-14 MED ORDER — GLIPIZIDE ER 10 MG PO TB24
10.0000 mg | ORAL_TABLET | Freq: Two times a day (BID) | ORAL | Status: DC
  Administered 2016-10-14: 10 mg via ORAL
  Filled 2016-10-14 (×2): qty 1

## 2016-10-14 MED ORDER — ACETAMINOPHEN 325 MG PO TABS
650.0000 mg | ORAL_TABLET | Freq: Four times a day (QID) | ORAL | Status: DC | PRN
  Administered 2016-10-15: 12:00:00 650 mg via ORAL
  Filled 2016-10-14: qty 2

## 2016-10-14 MED ORDER — HYDROCODONE-ACETAMINOPHEN 5-325 MG PO TABS
1.0000 | ORAL_TABLET | ORAL | Status: DC | PRN
  Administered 2016-10-14 – 2016-10-16 (×5): 1 via ORAL
  Filled 2016-10-14 (×5): qty 1

## 2016-10-14 MED ORDER — MORPHINE SULFATE (PF) 2 MG/ML IV SOLN
1.0000 mg | Freq: Once | INTRAVENOUS | Status: AC
  Administered 2016-10-14: 1 mg via INTRAVENOUS
  Filled 2016-10-14: qty 1

## 2016-10-14 MED ORDER — CIPROFLOXACIN IN D5W 400 MG/200ML IV SOLN
400.0000 mg | Freq: Two times a day (BID) | INTRAVENOUS | Status: DC
  Administered 2016-10-14 – 2016-10-15 (×3): 400 mg via INTRAVENOUS
  Filled 2016-10-14 (×5): qty 200

## 2016-10-14 MED ORDER — ONDANSETRON HCL 4 MG/2ML IJ SOLN
4.0000 mg | Freq: Once | INTRAMUSCULAR | Status: AC
  Administered 2016-10-14: 4 mg via INTRAVENOUS
  Filled 2016-10-14: qty 2

## 2016-10-14 MED ORDER — ENOXAPARIN SODIUM 40 MG/0.4ML ~~LOC~~ SOLN
40.0000 mg | SUBCUTANEOUS | Status: DC
  Administered 2016-10-14 – 2016-10-15 (×2): 40 mg via SUBCUTANEOUS
  Filled 2016-10-14 (×2): qty 0.4

## 2016-10-14 MED ORDER — SODIUM CHLORIDE 0.9 % IV SOLN
INTRAVENOUS | Status: DC
  Administered 2016-10-14 – 2016-10-15 (×3): via INTRAVENOUS

## 2016-10-14 MED ORDER — ASPIRIN 81 MG PO CHEW
81.0000 mg | CHEWABLE_TABLET | Freq: Every day | ORAL | Status: DC
  Administered 2016-10-14 – 2016-10-16 (×3): 81 mg via ORAL
  Filled 2016-10-14 (×3): qty 1

## 2016-10-14 MED ORDER — LISINOPRIL 20 MG PO TABS
20.0000 mg | ORAL_TABLET | Freq: Every day | ORAL | Status: DC
  Administered 2016-10-14 – 2016-10-16 (×3): 20 mg via ORAL
  Filled 2016-10-14 (×3): qty 1

## 2016-10-14 MED ORDER — ACETAMINOPHEN 650 MG RE SUPP: 650.0000 mg | Freq: Four times a day (QID) | RECTAL | Status: DC | PRN

## 2016-10-14 MED ORDER — CYCLOBENZAPRINE HCL 10 MG PO TABS
10.0000 mg | ORAL_TABLET | Freq: Three times a day (TID) | ORAL | Status: DC | PRN
  Administered 2016-10-15: 10 mg via ORAL
  Filled 2016-10-14: qty 1

## 2016-10-14 MED ORDER — IOPAMIDOL (ISOVUE-300) INJECTION 61%
100.0000 mL | Freq: Once | INTRAVENOUS | Status: AC | PRN
  Administered 2016-10-14: 100 mL via INTRAVENOUS

## 2016-10-14 MED ORDER — SODIUM CHLORIDE 0.9 % IV SOLN
Freq: Once | INTRAVENOUS | Status: AC
  Administered 2016-10-14: 08:00:00 via INTRAVENOUS

## 2016-10-14 MED ORDER — KETOROLAC TROMETHAMINE 30 MG/ML IJ SOLN
30.0000 mg | Freq: Four times a day (QID) | INTRAMUSCULAR | Status: DC | PRN
  Administered 2016-10-14 – 2016-10-16 (×5): 30 mg via INTRAVENOUS
  Filled 2016-10-14 (×5): qty 1

## 2016-10-14 MED ORDER — METRONIDAZOLE IN NACL 5-0.79 MG/ML-% IV SOLN
500.0000 mg | Freq: Once | INTRAVENOUS | Status: AC
  Administered 2016-10-14: 500 mg via INTRAVENOUS
  Filled 2016-10-14: qty 100

## 2016-10-14 MED ORDER — IOPAMIDOL (ISOVUE-300) INJECTION 61%
30.0000 mL | Freq: Once | INTRAVENOUS | Status: AC | PRN
  Administered 2016-10-14: 30 mL via ORAL

## 2016-10-14 NOTE — Progress Notes (Signed)
Moderate-High fall risk. Patient refuses bed alarm. Educated to call for assistance out of bed. Yellow non-skid socks on. Call bell within reach.   Yellow and red arm bands on.

## 2016-10-14 NOTE — H&P (Signed)
[employer] [facility] [place] [facility]   PATIENT NAME: [name]    MR#:  [id number]  DATE OF BIRTH:  1960-10-29  DATE OF ADMISSION:  10/14/2016  PRIMARY CARE PHYSICIAN: Rayetta Humphrey, MD   REQUESTING/REFERRING PHYSICIAN: dr Darnelle Catalan  CHIEF COMPLAINT:   Abdominal pain HISTORY OF PRESENT ILLNESS:  [name]  is a 56 y.o. female with a known history of Diabetes, HTN, well controlled Asthma who presents today to ED Complaining of left lower quadrant abdominal pain. Patient reports that her abdominal pain started 4 days ago. It is localized to left lower quadrant. It is exacerbated by any movement. It is relieved by rest. Currently she complains of 2 out of 10 abdominal pain. She has received some pain medications emergency room. CT scan shows acute diverticulitis. She has a colonoscopy scheduled as an outpatient in the next few months. She has not had a colonoscopy in the past. She has not had diverticulitis in the past as well.  PAST MEDICAL HISTORY:   Past Medical History:  Diagnosis Date  . Asthma   . Asthma   . Diabetes mellitus   . Fibromyalgia   . Fibromyalgia   . GERD (gastroesophageal reflux disease)   . History of degenerative disc disease   . Hyperlipidemia   . Hypertension     PAST SURGICAL HISTORY:   Past Surgical History:  Procedure Laterality Date  . ABDOMINAL HYSTERECTOMY    . ABDOMINAL SURGERY      SOCIAL HISTORY:   Social History  Substance Use Topics  . Smoking status: Current Every Day Smoker    Packs/day: 0.50    Types: Cigarettes  . Smokeless tobacco: Current User     Comment: currently decreasing tobacco use  . Alcohol use No     Comment: stopped 1 week ago, social drinker    FAMILY HISTORY:   Family History  Problem Relation Age of Onset  . Diabetes Mother   . Hypertension Mother     DRUG ALLERGIES:   Allergies  Allergen Reactions  . Benadryl [Diphenhydramine Hcl] Itching  . Bextra [Valdecoxib]  Itching  . Sulfamethoxazole-Trimethoprim Other (See Comments)  . Tramadol Itching    Headaches and confusion.    REVIEW OF SYSTEMS:   Review of Systems  Constitutional: Negative.  Negative for chills, fever and malaise/fatigue.  HENT: Negative.  Negative for ear discharge, ear pain, hearing loss, nosebleeds and sore throat.   Eyes: Negative.  Negative for blurred vision and pain.  Respiratory: Negative.  Negative for cough, hemoptysis, shortness of breath and wheezing.   Cardiovascular: Negative.  Negative for chest pain, palpitations and leg swelling.  Gastrointestinal: Positive for abdominal pain and constipation. Negative for blood in stool, diarrhea, nausea and vomiting.  Genitourinary: Negative.  Negative for dysuria.  Musculoskeletal: Negative.  Negative for back pain.  Skin: Negative.   Neurological: Negative for dizziness, tremors, speech change, focal weakness, seizures and headaches.  Endo/Heme/Allergies: Negative.  Does not bruise/bleed easily.  Psychiatric/Behavioral: Negative.  Negative for depression, hallucinations and suicidal ideas.    MEDICATIONS AT HOME:   Prior to Admission medications   Medication Sig Start Date End Date Taking? Authorizing Provider  albuterol (PROVENTIL HFA;VENTOLIN HFA) 108 (90 Base) MCG/ACT inhaler Inhale 1 puff into the lungs 2 (two) times daily as needed.    [provider]  aspirin 81 MG tablet Take 81 mg by mouth daily.    [provider]  cetirizine (ZYRTEC) 10 MG tablet TAKE ONE TABLET BY  MOUTH ONCE DAILY 09/26/16   Virl Axe, MD  citalopram (CELEXA) 40 MG tablet Take 40 mg by mouth daily.    [provider]  cyclobenzaprine (FLEXERIL) 10 MG tablet Take 1 tablet (10 mg total) by mouth 3 (three) times daily. 04/25/16   Zachery Dauer, FNP  gabapentin (NEURONTIN) 300 MG capsule Take 3 capsules by mouth 3 (three) times daily. 05/29/16 05/29/17  [provider]  glipiZIDE (GLUCOTROL XL) 10 MG 24 hr tablet Take  1 tablet by mouth 2 (two) times daily. 06/20/16 06/20/17  [provider]  HYDROcodone-acetaminophen (NORCO/VICODIN) 5-325 MG tablet Take 1 tablet by mouth every 4 (four) hours as needed. 08/20/16   [provider]  Insulin Detemir (LEVEMIR) 100 UNIT/ML Pen Inject 37 Units into the skin daily.  07/30/16 07/30/17  [provider]  lisinopril (PRINIVIL,ZESTRIL) 20 MG tablet Take 20 mg by mouth daily. 04/19/15   [provider]  meloxicam (MOBIC) 15 MG tablet Take 15 mg by mouth daily.    [provider]  metFORMIN (GLUCOPHAGE) 1000 MG tablet Take 1 tablet by mouth 2 (two) times daily. 06/07/16   [provider]  ondansetron (ZOFRAN) 4 MG tablet Take 1 tablet by mouth every 8 (eight) hours as needed. 07/02/16 07/02/17  [provider]  ranitidine (ZANTAC) 150 MG tablet Take 1 tablet (150 mg total) by mouth 2 (two) times daily. 04/25/16   Zachery Dauer, FNP  traZODone (DESYREL) 100 MG tablet Take 1 tablet by mouth at bedtime as needed.    [provider]      VITAL SIGNS:  Blood pressure 136/83, pulse (!) 121, temperature 97.7 F (36.5 C), temperature source Oral, resp. rate (!) 21, height 4\' 7"  (1.397 m), weight 61.2 kg (135 lb), SpO2 94 %.  PHYSICAL EXAMINATION:   Physical Exam  Constitutional: She is oriented to person, place, and time and well-developed, well-nourished, and in no distress. No distress.  HENT:  Head: Normocephalic.  Eyes: No scleral icterus.  Neck: Normal range of motion. Neck supple. No JVD present. No tracheal deviation present.  Cardiovascular: Normal rate, regular rhythm and normal heart sounds.  Exam reveals no gallop and no friction rub.   No murmur heard. Pulmonary/Chest: Effort normal and breath sounds normal. No respiratory distress. She has no wheezes. She has no rales. She exhibits no tenderness.  Abdominal: Soft. Bowel sounds are normal. She exhibits no mass. Distention: LLQ. There is tenderness. There  is no rebound and no guarding.  Musculoskeletal: Normal range of motion. She exhibits no edema.  Neurological: She is alert and oriented to person, place, and time.  Skin: Skin is warm. No rash noted. No erythema.  Psychiatric: Affect and judgment normal.      LABORATORY PANEL:   CBC  Recent Labs Lab 10/14/16 0804  WBC 15.4*  HGB 15.2  HCT 47.3*  PLT 251   ------------------------------------------------------------------------------------------------------------------  Chemistries   Recent Labs Lab 10/14/16 0800  NA 135  K 4.9  CL 102  CO2 24  GLUCOSE 193*  BUN 15  CREATININE 0.81  CALCIUM 9.8  AST 16  ALT 16  ALKPHOS 66  BILITOT 0.7   ------------------------------------------------------------------------------------------------------------------  Cardiac Enzymes No results for input(s): TROPONINI in the last 168 hours. ------------------------------------------------------------------------------------------------------------------  RADIOLOGY:  Ct Abdomen Pelvis W Contrast  Result Date: 10/14/2016 CLINICAL DATA:  Abdominal pain.  Left lower quadrant pain. EXAM: CT ABDOMEN AND PELVIS WITH CONTRAST TECHNIQUE: Multidetector CT imaging of the abdomen and pelvis was performed  using the standard protocol following bolus administration of intravenous contrast. CONTRAST:  100mL ISOVUE-300 IOPAMIDOL (ISOVUE-300) INJECTION 61% COMPARISON:  11/30/2003 FINDINGS: Lower chest: Areas of atelectasis in the lower lobes.  No effusions. Hepatobiliary: Diffuse low-density throughout the liver compatible with fatty infiltration. No focal abnormality. Small gallstone layering in the gallbladder. Pancreas: No focal abnormality or ductal dilatation. Spleen: No focal abnormality.  Normal size. Adrenals/Urinary Tract: No adrenal abnormality. No focal renal abnormality. No stones or hydronephrosis. Urinary bladder is unremarkable. Stomach/Bowel: There is wall thickening and marked  surrounding inflammation and fluid at the sigmoid flexure of the colon. There is a diverticulum noted in this area. Findings most likely reflect acute diverticulitis. Scattered diverticula in the left colon. Stomach and small bowel decompressed. Vascular/Lymphatic: Aortic and iliac calcifications. No aneurysm or adenopathy. Reproductive: Prior hysterectomy.  No adnexal masses. Other: No free fluid or free air. Musculoskeletal: No acute bony abnormality. IMPRESSION: Colonic wall thickening and marked surrounding inflammation at the sigmoid flexure where there is a diverticulum noted. Findings likely reflect acute diverticulitis. This could be followed with repeat CT or colonoscopy after treatment an acute symptoms resolve. Cholelithiasis. Fatty liver. Aortoiliac atherosclerosis. Electronically Signed   By: Charlett NoseKevin  Dover M.D.   On: 10/14/2016 09:43    EKG:   Orders placed or performed during the hospital encounter of 10/07/16  . ED EKG  . ED EKG    IMPRESSION AND PLAN:   56 year old female with a history of diabetes who presents with left lower quadrant abdominal pain and found to have acute diverticulitis on CT scan.  1. Sepsis: Patient presents with tachycardia, leukocytosis and initially was hypotensive due to diverticulitis. Continue IV fluids  2. Acute diverticulitis: Continue with ciprofloxacin and Flagyl. Patient will need 10-14 days of antibiotics Clear liquid diet for now and advance tolerated Patient has outpatient colonoscopy scheduled as per her report    3. Diabetes: Hold oral medications until patient is taking in adequate by mouth's Decreased dose of long-acting Levemir  Diabetes coordinator consultation requested Start sliding scale insulin  4. Essential hypertension: Continue lisinopril  5. Well-controlled asthma, mild intermittent: No signs of exacerbation at this time.  All the records are reviewed and case discussed with ED provider. Management plans discussed with  the patient and SHE IS in agreement  CODE STATUS: FULL  TOTAL TIME TAKING CARE OF THIS PATIENT: 43 minutes.    Valoree Agent M.D on 10/14/2016 at 10:19 AM  Between 7am to 6pm - Pager - [phone]  After 6pm go to [email or web address] - password [email or web address]  [facility]  Office  [phone]  CC: Primary care physician; Rayetta HumphreyGeorge, Sionne A, MD   '

## 2016-10-14 NOTE — Progress Notes (Signed)
Pharmacy Antibiotic Note  Erika MiniumJanice R Cook is a 56 y.o. female admitted on 10/14/2016 with  Intra-abdominal Infection .  Pharmacy has been consulted for Cipro dosing.  Plan: Will start patient on Ciprofloxacin 400mg  IV every 12 hours.   Height: 4\' 7"  (139.7 cm) Weight: 134 lb 12.8 oz (61.1 kg) IBW/kg (Calculated) : 34  Temp (24hrs), Avg:99 F (37.2 C), Min:97.7 F (36.5 C), Max:100.3 F (37.9 C)   Recent Labs Lab 10/14/16 0800 10/14/16 0804  WBC  --  15.4*  CREATININE 0.81  --   LATICACIDVEN  --  1.6    Estimated Creatinine Clearance: 55.5 mL/min (by C-G formula based on SCr of 0.81 mg/dL).    Allergies  Allergen Reactions  . Benadryl [Diphenhydramine Hcl] Itching  . Bextra [Valdecoxib] Itching  . Sulfamethoxazole-Trimethoprim Other (See Comments)  . Tramadol Itching    Headaches and confusion.    Antimicrobials this admission: 7/23 ciprofloxacin  >> 7/23 metronidazole  >>   Dose adjustments this admission:  Microbiology results:  Thank you for allowing pharmacy to be a part of this patient's care.  Gardner CandleSheema M Nolene Rocks, PharmD, BCPS Clinical Pharmacist 10/14/2016 12:28 PM

## 2016-10-14 NOTE — ED Triage Notes (Signed)
Here for LLQ pain starting Saturday. Some nausea no vomiting. No bowel movement in 4 days. No fevers.  Skin warm and dry.

## 2016-10-14 NOTE — ED Notes (Signed)
Admitting provider at bedside.

## 2016-10-14 NOTE — ED Notes (Signed)
Patient transported to CT 

## 2016-10-14 NOTE — Progress Notes (Signed)
Inpatient Diabetes Program Recommendations  AACE/ADA: New Consensus Statement on Inpatient Glycemic Control (2015)  Target Ranges:  Prepandial:   less than 140 mg/dL      Peak postprandial:   less than 180 mg/dL (1-2 hours)      Critically ill patients:  140 - 180 mg/dL   Results for Erika MiniumCLINKSCALES, [name] (MRN [id number]) as of 10/14/2016 10:57  Ref. Range 10/14/2016 08:00  Glucose Latest Ref Range: 65 - 99 mg/dL 409193 (H)   Review of Glycemic Control  Diabetes history: DM 2 Outpatient Diabetes medications: Glipizide 10 mg BID, Metformin 1000 mg BID, Levemir 34 units  Inpatient Diabetes Program Recommendations:    Consider half of patient's Levemir dose since NPO, Levemir 16 units. Consider Novolog Moderate Correction 0-15 units Q4 hour if NPO for testing.  A1c 10.9% on 08/28/16 (care everywhere), uncontrolled  Thanks,  Christena DeemShannon Leilana Mcquire RN, MSN, [facility] Inpatient Diabetes Coordinator Team Pager [phone] (8a-5p)

## 2016-10-14 NOTE — ED Provider Notes (Signed)
[facility] Emergency Department Provider Note   ____________________________________________   First MD Initiated Contact with Patient 10/14/16 0801     (approximate)  I have reviewed the triage vital signs and the nursing notes.   HISTORY  Chief Complaint Abdominal Pain  HPI [name] is a 56 y.o. female patient reports left lower quadrant pain for 4 days. She had some sweating as well but no known fever nausea but no vomiting no stool for 4 days. Pain is in the left lower quadrant left side of the abdomen. Severe at times. Made worse with movement. Patient reports she felt something like this when she had a small bowel obstruction from occasions.   Past Medical History:  Diagnosis Date  . Asthma   . Asthma   . Diabetes mellitus   . Fibromyalgia   . Fibromyalgia   . GERD (gastroesophageal reflux disease)   . History of degenerative disc disease   . Hyperlipidemia   . Hypertension     Patient Active Problem List   Diagnosis Date Noted  . COPD (chronic obstructive pulmonary disease) (HCC) 01/02/2016  . Essential hypertension 05/15/2015  . Fibromyalgia 04/19/2015  . Diabetes (HCC) 02/08/2015    Past Surgical History:  Procedure Laterality Date  . ABDOMINAL HYSTERECTOMY    . ABDOMINAL SURGERY      Prior to Admission medications   Medication Sig Start Date End Date Taking? Authorizing Provider  albuterol (PROVENTIL HFA;VENTOLIN HFA) 108 (90 Base) MCG/ACT inhaler Inhale 1 puff into the lungs 2 (two) times daily as needed.    [provider]  aspirin 81 MG tablet Take 81 mg by mouth daily.    [provider]  cetirizine (ZYRTEC) 10 MG tablet TAKE ONE TABLET BY MOUTH ONCE DAILY 09/26/16   Virl Axehaplin, Don C, MD  citalopram (CELEXA) 40 MG tablet Take 40 mg by mouth daily.    [provider]  cyclobenzaprine (FLEXERIL) 10 MG tablet Take 1 tablet (10 mg total) by mouth 3 (three) times daily. 04/25/16   Zachery Dauerdem, Donna S,  FNP  gabapentin (NEURONTIN) 300 MG capsule Take 3 capsules by mouth 3 (three) times daily. 05/29/16 05/29/17  [provider]  glipiZIDE (GLUCOTROL XL) 10 MG 24 hr tablet Take 1 tablet by mouth 2 (two) times daily. 06/20/16 06/20/17  [provider]  HYDROcodone-acetaminophen (NORCO/VICODIN) 5-325 MG tablet Take 1 tablet by mouth every 4 (four) hours as needed. 08/20/16   [provider]  Insulin Detemir (LEVEMIR) 100 UNIT/ML Pen Inject 37 Units into the skin daily.  07/30/16 07/30/17  [provider]  lisinopril (PRINIVIL,ZESTRIL) 20 MG tablet Take 20 mg by mouth daily. 04/19/15   [provider]  meloxicam (MOBIC) 15 MG tablet Take 15 mg by mouth daily.    [provider]  metFORMIN (GLUCOPHAGE) 1000 MG tablet Take 1 tablet by mouth 2 (two) times daily. 06/07/16   [provider]  ondansetron (ZOFRAN) 4 MG tablet Take 1 tablet by mouth every 8 (eight) hours as needed. 07/02/16 07/02/17  [provider]  ranitidine (ZANTAC) 150 MG tablet Take 1 tablet (150 mg total) by mouth 2 (two) times daily. 04/25/16   Zachery Dauerdem, Donna S, FNP  traZODone (DESYREL) 100 MG tablet Take 1 tablet by mouth at bedtime as needed.    [provider]    Allergies Benadryl [diphenhydramine hcl]; Bextra [valdecoxib]; Sulfamethoxazole-trimethoprim; and Tramadol  Family History  Problem Relation Age of Onset  . Diabetes Mother   . Hypertension  Mother     Social History Social History  Substance Use Topics  . Smoking status: Current Every Day Smoker    Packs/day: 0.50    Types: Cigarettes  . Smokeless tobacco: Current User     Comment: currently decreasing tobacco use  . Alcohol use No     Comment: stopped 1 week ago, social drinker    Review of Systems  Constitutional: No fever/chills Eyes: No visual changes. ENT: No sore throat. Cardiovascular: Denies chest pain. Respiratory: Denies shortness of breath. Gastrointestinal: See history of present  illness Genitourinary: Negative for dysuria. Musculoskeletal: Negative for back pain. Skin: Negative for rash. Neurological: Negative for headaches, focal weakness   ____________________________________________   PHYSICAL EXAM:  VITAL SIGNS: ED Triage Vitals [10/14/16 0749]  Enc Vitals Group     BP (!) 77/53     Pulse Rate (!) 117     Resp (!) 22     Temp 97.7 F (36.5 C)     Temp Source Oral     SpO2 100 %     Weight 135 lb (61.2 kg)     Height 4\' 7"  (1.397 m)     Head Circumference      Peak Flow      Pain Score 10     Pain Loc      Pain Edu?      Excl. in GC?     Constitutional: Alert and oriented. Well appearing But in  acute distress. Eyes: Conjunctivae are normal.  Head: Atraumatic. Nose: No congestion/rhinnorhea. Mouth/Throat: Mucous membranes are moist.  Oropharynx non-erythematous. Neck: No stridor. Cardiovascular: Normal rate, regular rhythm. Grossly normal heart sounds.  Good peripheral circulation. Respiratory: Normal respiratory effort.  No retractions. Lungs CTAB. Gastrointestinal: Soft tender palpation percussion on the left side markedly decreased bowel sounds No distention. No abdominal bruits. No CVA tenderness. Musculoskeletal: No lower extremity tenderness nor edema.  No joint effusions. Neurologic:  Normal speech and language. No gross focal neurologic deficits are appreciated.  Skin:  Skin is warm, dry and intact. No rash noted. Psychiatric: Mood and affect are normal. Speech and behavior are normal.  ____________________________________________   LABS (all labs ordered are listed, but only abnormal results are displayed)  Labs Reviewed  COMPREHENSIVE METABOLIC PANEL - Abnormal; Notable for the following:       Result Value   Glucose, Bld 193 (*)    All other components within normal limits  CBC WITH DIFFERENTIAL/PLATELET - Abnormal; Notable for the following:    WBC 15.4 (*)    RBC 5.40 (*)    HCT 47.3 (*)    RDW 15.1 (*)    Neutro  Abs 11.3 (*)    Monocytes Absolute 1.1 (*)    All other components within normal limits  URINALYSIS, COMPLETE (UACMP) WITH MICROSCOPIC - Abnormal; Notable for the following:    Color, Urine YELLOW (*)    APPearance CLEAR (*)    Squamous Epithelial / LPF 6-30 (*)    All other components within normal limits  LIPASE, BLOOD  LACTIC ACID, PLASMA   ____________________________________________  EKG   ____________________________________________  RADIOLOGY  CT shows diverticulitis ____________________________________________   PROCEDURES  Procedure(s) performed  Procedures  Critical Care performed:   ____________________________________________   INITIAL IMPRESSION / ASSESSMENT AND PLAN / ED COURSE  Pertinent labs & imaging results that were available during my care of the patient were reviewed by me and considered in my medical decision making (see chart for details).   Patient's blood  pressure has come up her white count is elevated but she has no fever and no other signs of sepsis at this point     ____________________________________________   FINAL CLINICAL IMPRESSION(S) / ED DIAGNOSES  Final diagnoses:  Diverticulitis of large intestine without bleeding, unspecified complication status      NEW MEDICATIONS STARTED DURING THIS VISIT:  New Prescriptions   No medications on file     Note:  This document was prepared using Dragon voice recognition software and may include unintentional dictation errors.    Arnaldo Natal, MD 10/14/16 [phone]

## 2016-10-14 NOTE — ED Notes (Signed)
Informed RN bed ready  1104

## 2016-10-15 ENCOUNTER — Telehealth: Payer: Self-pay | Admitting: *Deleted

## 2016-10-15 LAB — CBC
HEMATOCRIT: 37.1 % (ref 35.0–47.0)
Hemoglobin: 12.1 g/dL (ref 12.0–16.0)
MCH: 29.1 pg (ref 26.0–34.0)
MCHC: 32.7 g/dL (ref 32.0–36.0)
MCV: 89.1 fL (ref 80.0–100.0)
PLATELETS: 182 10*3/uL (ref 150–440)
RBC: 4.17 MIL/uL (ref 3.80–5.20)
RDW: 15.5 % — AB (ref 11.5–14.5)
WBC: 9.9 10*3/uL (ref 3.6–11.0)

## 2016-10-15 LAB — GLUCOSE, CAPILLARY
GLUCOSE-CAPILLARY: 67 mg/dL (ref 65–99)
GLUCOSE-CAPILLARY: 116 mg/dL — AB (ref 65–99)
Glucose-Capillary: 93 mg/dL (ref 65–99)
Glucose-Capillary: 135 mg/dL — ABNORMAL HIGH (ref 65–99)
Glucose-Capillary: 126 mg/dL — ABNORMAL HIGH (ref 65–99)

## 2016-10-15 LAB — BASIC METABOLIC PANEL
Anion gap: 6 (ref 5–15)
BUN: 14 mg/dL (ref 6–20)
CHLORIDE: 107 mmol/L (ref 101–111)
CO2: 25 mmol/L (ref 22–32)
CREATININE: 0.76 mg/dL (ref 0.44–1.00)
Calcium: 8.3 mg/dL — ABNORMAL LOW (ref 8.9–10.3)
Glucose, Bld: 101 mg/dL — ABNORMAL HIGH (ref 65–99)
POTASSIUM: 4.4 mmol/L (ref 3.5–5.1)
SODIUM: 138 mmol/L (ref 135–145)

## 2016-10-15 MED ORDER — FLUTICASONE PROPIONATE 50 MCG/ACT NA SUSP
1.0000 | Freq: Every day | NASAL | Status: DC
  Administered 2016-10-15 – 2016-10-16 (×2): 1 via NASAL
  Filled 2016-10-15: qty 16

## 2016-10-15 MED ORDER — TRAZODONE HCL 50 MG PO TABS
100.0000 mg | ORAL_TABLET | Freq: Every day | ORAL | Status: DC
  Administered 2016-10-15: 100 mg via ORAL
  Filled 2016-10-15: qty 2

## 2016-10-15 MED ORDER — TOPIRAMATE 25 MG PO TABS
25.0000 mg | ORAL_TABLET | Freq: Two times a day (BID) | ORAL | Status: DC
  Administered 2016-10-15 – 2016-10-16 (×3): 25 mg via ORAL
  Filled 2016-10-15 (×4): qty 1

## 2016-10-15 NOTE — Progress Notes (Signed)
Met with patient to discuss recent A1C of 10.9%- she thinks her last one was 10%.  Patient understands that the A1C was high and as a result the MD increased her Levemir at her last visit.  Because the patient is currently taking diabetes classes, she is checking her sugar (although she reports "I eat and then I remember to check my sugar").  I have encouraged her to check her sugar 2 hours after a meal if she forgets to take it before.  We discussed the improtance of blood sugar control to prevent long term complications of diabetes.   Reports that she is attempting to follow the meal planning guidelines discussed in class- she was able to report to me that she needed carbs, protein and "free" vegetables at each meal.  She reports frustration that her blood sugars are still near 300 mg/dl even after "got rid of all my sweet; candy, cakes, sweets".  Educated about carbohydrates and the importance of limited carbohydrates for management of her diabetes.  I also discussed the possibility of adding mealtime insulin - discussed the difference between long acting and mealtime insulin.  Patient attentive and does not offer resistance. No further questions about diabetes at this time.     , RN, BA, MHA, CDE Diabetes Coordinator Inpatient Diabetes Program  [phone] (Team Pager) [phone] ([facility] Office) 10/15/2016 11:46 AM   

## 2016-10-15 NOTE — Progress Notes (Signed)
Hypoglycemic Event  CBG: 67  Treatment: 4 oz ginger ale given per patient request    Symptoms: nauseous   Follow-up CBG Result:116  Possible Reasons for Event: insulin given at lunch time despite eating well   [name] [name] [name]

## 2016-10-15 NOTE — Telephone Encounter (Signed)
Received call from patient regarding her Diabetes classes. She reports that she is currently in the hospital. Instructed her to call when she is discharged to reschedule Classes 2 and 3.

## 2016-10-15 NOTE — Progress Notes (Addendum)
Inpatient Diabetes Program Recommendations  AACE/ADA: New Consensus Statement on Inpatient Glycemic Control (2015)  Target Ranges:  Prepandial:   less than 140 mg/dL      Peak postprandial:   less than 180 mg/dL (1-2 hours)      Critically ill patients:  140 - 180 mg/dL   Lab Results  Component Value Date   GLUCAP 93 10/15/2016   HGBA1C 10.4 (H) 05/02/2016    Review of Glycemic Control  Results for Erika MiniumCLINKSCALES, [name] (MRN [id number]) as of 10/15/2016 07:48  Ref. Range 10/14/2016 12:06 10/14/2016 16:41 10/14/2016 20:49 10/15/2016 07:30  Glucose-Capillary Latest Ref Range: 65 - 99 mg/dL 295128 (H) 621238 (H) 308222 (H) 93   Diabetes history: Type 2 Outpatient Diabetes medications:Levemir 34 units qhs, Glipizide 10mg  bid, Metformin 1000mg  bid  Current orders for Inpatient glycemic control: Levemir 20 units qhs, Glipizide 10mg  bid, Novolog 0-9 units tid  Inpatient Diabetes Program Recommendations:    Please d/c Glipizide while patient is in hospital (high risk hypoglycemia).   Change diet to carb modified.  Add Novolog 0-5 units qhs. Continue Novolog correction as ordered.  ** A1C 10.9% at MD- consider sending her home on Lantus plus Novolog  (d/c Glipizide all together).  Note in care everywhere states she's reluctant to take newer diabetes medications for fear of potential side effects.   Susette RacerJulie Geniyah Eischeid, RN, BA, MHA, CDE Diabetes Coordinator Inpatient Diabetes Program  [phone] (Team Pager) [phone] [facility] Office) 10/15/2016 7:49 AM

## 2016-10-15 NOTE — Progress Notes (Signed)
[facility] - [place] at [facility]   PATIENT NAME: [name]    MR#:  [id number]  DATE OF BIRTH:  [birth year]  SUBJECTIVE:   Came in the left lower abdominal pain. Able to tolerate clear liquid diet. Continues to have some intermittent pain. No blood in stool.1 REVIEW OF SYSTEMS:   Review of Systems  Constitutional: Negative for chills, fever and weight loss.  HENT: Negative for ear discharge, ear pain and nosebleeds.   Eyes: Negative for blurred vision, pain and discharge.  Respiratory: Negative for sputum production, shortness of breath, wheezing and stridor.   Cardiovascular: Negative for chest pain, palpitations, orthopnea and PND.  Gastrointestinal: Positive for abdominal pain. Negative for diarrhea, nausea and vomiting.  Genitourinary: Negative for frequency and urgency.  Musculoskeletal: Negative for back pain and joint pain.  Neurological: Negative for sensory change, speech change, focal weakness and weakness.  Psychiatric/Behavioral: Negative for depression and hallucinations. The patient is not nervous/anxious.    Tolerating Diet: Clear liquid Tolerating PT: Ambulatory  DRUG ALLERGIES:   Allergies  Allergen Reactions  . Benadryl [Diphenhydramine Hcl] Itching  . Bextra [Valdecoxib] Itching  . Sulfamethoxazole-Trimethoprim Other (See Comments)  . Tramadol Itching    Headaches and confusion.    VITALS:  Blood pressure 103/75, pulse (!) 111, temperature 98.7 F (37.1 C), temperature source Oral, resp. rate 14, height 4\' 7"  (1.397 m), weight 61.1 kg (134 lb 12.8 oz), SpO2 94 %.  PHYSICAL EXAMINATION:   Physical Exam  GENERAL:  56 y.o.-year-old patient lying in the bed with no acute distress.  EYES: Pupils equal, round, reactive to light and accommodation. No scleral icterus. Extraocular muscles intact.  HEENT: Head atraumatic, normocephalic. Oropharynx and nasopharynx clear.  NECK:  Supple, no jugular venous distention. No  thyroid enlargement, no tenderness.  LUNGS: Normal breath sounds bilaterally, no wheezing, rales, rhonchi. No use of accessory muscles of respiration.  CARDIOVASCULAR: S1, S2 normal. No murmurs, rubs, or gallops.  ABDOMEN: Soft, tender left lower quadrant. No guarding rigidity, nondistended. Bowel sounds present. No organomegaly or mass.  EXTREMITIES: No cyanosis, clubbing or edema b/l.    NEUROLOGIC: Cranial nerves II through XII are intact. No focal Motor or sensory deficits b/l.   PSYCHIATRIC:  patient is alert and oriented x 3.  SKIN: No obvious rash, lesion, or ulcer.   LABORATORY PANEL:  CBC  Recent Labs Lab 10/15/16 0417  WBC 9.9  HGB 12.1  HCT 37.1  PLT 182    Chemistries   Recent Labs Lab 10/14/16 0800 10/15/16 0417  NA 135 138  K 4.9 4.4  CL 102 107  CO2 24 25  GLUCOSE 193* 101*  BUN 15 14  CREATININE 0.81 0.76  CALCIUM 9.8 8.3*  AST 16  --   ALT 16  --   ALKPHOS 66  --   BILITOT 0.7  --    Cardiac Enzymes No results for input(s): TROPONINI in the last 168 hours. RADIOLOGY:  Ct Abdomen Pelvis W Contrast  Result Date: 10/14/2016 CLINICAL DATA:  Abdominal pain.  Left lower quadrant pain. EXAM: CT ABDOMEN AND PELVIS WITH CONTRAST TECHNIQUE: Multidetector CT imaging of the abdomen and pelvis was performed using the standard protocol following bolus administration of intravenous contrast. CONTRAST:  ISOVUE-300 IOPAMIDOL (ISOVUE-300) INJECTION 61% COMPARISON:  11/30/2003 FINDINGS: Lower chest: Areas of atelectasis in the lower lobes.  No effusions. Hepatobiliary: Diffuse low-density throughout the liver compatible with fatty infiltration. No focal abnormality. Small gallstone layering in the gallbladder. Pancreas: No  focal abnormality or ductal dilatation. Spleen: No focal abnormality.  Normal size. Adrenals/Urinary Tract: No adrenal abnormality. No focal renal abnormality. No stones or hydronephrosis. Urinary bladder is unremarkable. Stomach/Bowel: There is  wall thickening and marked surrounding inflammation and fluid at the sigmoid flexure of the colon. There is a diverticulum noted in this area. Findings most likely reflect acute diverticulitis. Scattered diverticula in the left colon. Stomach and small bowel decompressed. Vascular/Lymphatic: Aortic and iliac calcifications. No aneurysm or adenopathy. Reproductive: Prior hysterectomy.  No adnexal masses. Other: No free fluid or free air. Musculoskeletal: No acute bony abnormality. IMPRESSION: Colonic wall thickening and marked surrounding inflammation at the sigmoid flexure where there is a diverticulum noted. Findings likely reflect acute diverticulitis. This could be followed with repeat CT or colonoscopy after treatment an acute symptoms resolve. Cholelithiasis. Fatty liver. Aortoiliac atherosclerosis. Electronically Signed   By: Charlett NoseKevin  Dover M.D.   On: 10/14/2016 09:43   ASSESSMENT AND PLAN:  56 year old female with a history of diabetes who presents with left lower quadrant abdominal pain and found to have acute diverticulitis on CT scan.  1. Sepsis: On admission Patient presents with tachycardia, leukocytosis and initially was hypotensive due to diverticulitis. Continue IV fluids -Patient afebrile  2. Acute sigmoid diverticulitis:  -Continue with ciprofloxacin and Flagyl. Patient will need 10-14 days of antibiotics Clear liquid diet for now and advance tolerated Patient has outpatient colonoscopy scheduled as per her report  3. Diabetes: - Hold oral medications until patient is taking in adequate by mouth's -Decreased dose of long-acting Levemir -sliding scale insulin  4. Essential hypertension: Continue lisinopril  5. Well-controlled asthma, mild intermittent: No signs of exacerbation at this time.   Case discussed with Care Management/Social Worker. Management plans discussed with the patient, family and they are in agreement.  CODE STATUS: full  DVT Prophylaxis:  Lovenox  TOTAL TIME TAKING CARE OF THIS PATIENT: 30 minutes.  >50% time spent on counselling and coordination of care  POSSIBLE D/C IN 1-2 DAYS, DEPENDING ON CLINICAL CONDITION.  Note: This dictation was prepared with Dragon dictation along with smaller phrase technology. Any transcriptional errors that result from this process are unintentional.  Izzie Geers M.D on 10/15/2016 at 10:16 AM  Between 7am to 6pm - Pager - [phone]  After 6pm go to [email or web address] - password [facility] [email or web address]  [facility]  Office  [phone]  CC: Primary care physician; Rayetta HumphreyGeorge, Sionne A, MD

## 2016-10-16 ENCOUNTER — Other Ambulatory Visit: Payer: Self-pay

## 2016-10-16 LAB — TROPONIN I

## 2016-10-16 LAB — GLUCOSE, CAPILLARY
Glucose-Capillary: 107 mg/dL — ABNORMAL HIGH (ref 65–99)
Glucose-Capillary: 270 mg/dL — ABNORMAL HIGH (ref 65–99)

## 2016-10-16 LAB — HIV ANTIBODY (ROUTINE TESTING W REFLEX): HIV SCREEN 4TH GENERATION: NONREACTIVE

## 2016-10-16 MED ORDER — CIPROFLOXACIN HCL 500 MG PO TABS: 500.0000 mg | ORAL_TABLET | Freq: Two times a day (BID) | ORAL | 0 refills | Status: DC

## 2016-10-16 MED ORDER — METRONIDAZOLE 500 MG PO TABS
500.0000 mg | ORAL_TABLET | Freq: Three times a day (TID) | ORAL | Status: DC
  Administered 2016-10-16: 10:00:00 500 mg via ORAL
  Filled 2016-10-16: qty 1

## 2016-10-16 MED ORDER — METRONIDAZOLE 500 MG PO TABS: 500.0000 mg | ORAL_TABLET | Freq: Three times a day (TID) | ORAL | 0 refills | Status: DC

## 2016-10-16 MED ORDER — CIPROFLOXACIN HCL 500 MG PO TABS
500.0000 mg | ORAL_TABLET | Freq: Two times a day (BID) | ORAL | Status: DC
  Administered 2016-10-16: 500 mg via ORAL
  Filled 2016-10-16: qty 1

## 2016-10-16 MED ORDER — NITROGLYCERIN 0.4 MG SL SUBL
0.4000 mg | SUBLINGUAL_TABLET | SUBLINGUAL | Status: DC | PRN
  Administered 2016-10-16: 0.4 mg via SUBLINGUAL

## 2016-10-16 MED ORDER — INSULIN DETEMIR 100 UNIT/ML FLEXPEN: 20.0000 [IU] | PEN_INJECTOR | Freq: Every day | SUBCUTANEOUS | 11 refills | Status: DC

## 2016-10-16 MED ORDER — MAGNESIUM HYDROXIDE 400 MG/5ML PO SUSP
30.0000 mL | Freq: Once | ORAL | Status: AC
Start: 2016-10-16 — End: 2016-10-16
  Administered 2016-10-16: 30 mL via ORAL
  Filled 2016-10-16: qty 30

## 2016-10-16 MED ORDER — NITROGLYCERIN 0.4 MG SL SUBL
SUBLINGUAL_TABLET | SUBLINGUAL | Status: AC
  Administered 2016-10-16: 0.4 mg via SUBLINGUAL
  Filled 2016-10-16: qty 1

## 2016-10-16 NOTE — Progress Notes (Signed)
Patient discharged home per MD order. All discharge instructions given and all questions answered. Patient and daughter verbalized understanding of all discharge instructions.

## 2016-10-16 NOTE — Plan of Care (Signed)
Problem: Food- and Nutrition-Related Knowledge Deficit (NB-1.1) Goal: Nutrition education Formal process to instruct or train a patient/client in a skill or to impart knowledge to help patients/clients voluntarily manage or modify food choices and eating behavior to maintain or improve health. Outcome: Completed/Met Date Met: 10/16/16 Nutrition Education Note  RD received verbal consult from RN for nutrition education regarding a diet for discharge. Patient has diverticulitis and will be on a soft diet (low-fiber).  RD provided "Low-Fiber Nutrition Therapy" handout from the Academy of Nutrition and Dietetics. Encouraged intake of soft, well-cooked foods that are low in fiber. Discussed foods recommended that are low in fiber. Discouraged intake of foods not recommended that are higher in fiber. Encouraged intake of a protein food at each meal and snack. RD discussed why it is important to adhere to list of recommended foods, and foods to avoid. Teach-back method used.  Expect good compliance  Body mass index is 31.33 kg/m. Pt meets criteria for obesity class I based on current BMI.   Current diet order is FLD, patient is consuming approximately 50-100% of meals at this time. Labs and medications reviewed. No further nutrition interventions warranted at this time. RD contact information provided. If additional nutrition issues arise, please re-consult RD.  Willey Blade, MS, RD, LDN Pager: [phone] After Hours Pager: [phone]

## 2016-10-16 NOTE — Discharge Summary (Signed)
[facility] - [place] at [facility]   PATIENT NAME: [name]    MR#:  [id number]  DATE OF BIRTH:  Apr 22, 1960  DATE OF ADMISSION:  10/14/2016 ADMITTING PHYSICIAN: Adrian Saran, MD  DATE OF DISCHARGE: 10/16/2016  PRIMARY CARE PHYSICIAN: Rayetta Humphrey, MD    ADMISSION DIAGNOSIS:  Diverticulitis of large intestine without bleeding, unspecified complication status [K57.32]  DISCHARGE DIAGNOSIS:  Sepsis on admission-resolved Acute sigmoid diverticulitis History of constipation SECONDARY DIAGNOSIS:   Past Medical History:  Diagnosis Date  . Asthma   . Asthma   . Diabetes mellitus   . Fibromyalgia   . Fibromyalgia   . GERD (gastroesophageal reflux disease)   . History of degenerative disc disease   . Hyperlipidemia   . Hypertension     HOSPITAL COURSE:  56 year old female with a history of diabetes who presents with left lower quadrant abdominal pain and found to have acute diverticulitis on CT scan.  1. Sepsis: On admission Patient presents with tachycardia, leukocytosis and initially was hypotensive due to diverticulitis. Continue IV fluids -Patient afebrile  2. Acute sigmoid diverticulitis:  -Continue with ciprofloxacin and Flagyl. Patient will need 10 days of antibiotics Clear liquid diet for now and advance to full liquid which is tolerated by patient Patient has outpatient colonoscopy scheduled as per her report.  3. Diabetes: -  resumed metformin. Discontinue glipizide. -Decreased dose of long-acting Levemir 20 units. -sliding scale insulin  4. Essential hypertension: Continue lisinopril  5. Well-controlled asthma, mild intermittent -No signs of exacerbation at this time.  6. History of constipation Patient recommended to take stool softener and high-fiber diet. She is on chronic narcotics for her back pain.  Overall hemodynamically stable Discharge home. CONSULTS OBTAINED:    DRUG ALLERGIES:   Allergies   Allergen Reactions  . Benadryl [Diphenhydramine Hcl] Itching  . Bextra [Valdecoxib] Itching  . Sulfamethoxazole-Trimethoprim Other (See Comments)  . Tramadol Itching    Headaches and confusion.    DISCHARGE MEDICATIONS:   Current Discharge Medication List    START taking these medications   Details  ciprofloxacin (CIPRO) 500 MG tablet Take 1 tablet (500 mg total) by mouth 2 (two) times daily. Qty: 16 tablet, Refills: 0    metroNIDAZOLE (FLAGYL) 500 MG tablet Take 1 tablet (500 mg total) by mouth every 8 (eight) hours. Qty: 24 tablet, Refills: 0      CONTINUE these medications which have CHANGED   Details  Insulin Detemir (LEVEMIR) 100 UNIT/ML Pen Inject 20 Units into the skin at bedtime. Qty: 15 mL, Refills: 11      CONTINUE these medications which have NOT CHANGED   Details  albuterol (PROVENTIL HFA;VENTOLIN HFA) 108 (90 Base) MCG/ACT inhaler Inhale 1 puff into the lungs 2 (two) times daily as needed.    aspirin 81 MG tablet Take 81 mg by mouth daily.    cetirizine (ZYRTEC) 10 MG tablet TAKE ONE TABLET BY MOUTH ONCE DAILY Qty: 30 tablet, Refills: 3    citalopram (CELEXA) 40 MG tablet Take 40 mg by mouth daily.    cyclobenzaprine (FLEXERIL) 10 MG tablet Take 1 tablet (10 mg total) by mouth 3 (three) times daily. Qty: 30 tablet, Refills: 0    fluticasone (FLONASE) 50 MCG/ACT nasal spray Place 1 spray into both nostrils daily.    gabapentin (NEURONTIN) 300 MG capsule Take 3 capsules by mouth 3 (three) times daily.    HYDROcodone-acetaminophen (NORCO/VICODIN) 5-325 MG tablet Take 1 tablet by mouth every 4 (four) hours  as needed.    lisinopril (PRINIVIL,ZESTRIL) 10 MG tablet Take 10 mg by mouth daily.     meclizine (ANTIVERT) 25 MG tablet Take 25 mg by mouth 3 (three) times daily as needed for dizziness.    meloxicam (MOBIC) 15 MG tablet Take 15 mg by mouth daily.    metFORMIN (GLUCOPHAGE) 1000 MG tablet Take 1 tablet by mouth 2 (two) times daily.    nicotine  (NICODERM CQ - DOSED IN MG/24 HOURS) 21 mg/24hr patch Place 21 mg onto the skin daily.    ondansetron (ZOFRAN) 4 MG tablet Take 1 tablet by mouth every 8 (eight) hours as needed.    pantoprazole (PROTONIX) 40 MG tablet Take 40 mg by mouth daily.    polyethylene glycol (MIRALAX / GLYCOLAX) packet Take 17 g by mouth daily.    rizatriptan (MAXALT) 10 MG tablet Take 10 mg by mouth as needed for migraine. May repeat in 2 hours if needed    rosuvastatin (CRESTOR) 20 MG tablet Take 20 mg by mouth daily.    topiramate (TOPAMAX) 25 MG tablet Take 25 mg by mouth 2 (two) times daily.    traZODone (DESYREL) 100 MG tablet Take 1 tablet by mouth at bedtime as needed.    ranitidine (ZANTAC) 150 MG tablet Take 1 tablet (150 mg total) by mouth 2 (two) times daily. Qty: 60 tablet, Refills: 3      STOP taking these medications     glipiZIDE (GLUCOTROL XL) 10 MG 24 hr tablet         If you experience worsening of your admission symptoms, develop shortness of breath, life threatening emergency, suicidal or homicidal thoughts you must seek medical attention immediately by calling 911 or calling your MD immediately  if symptoms less severe.  You Must read complete instructions/literature along with all the possible adverse reactions/side effects for all the Medicines you take and that have been prescribed to you. Take any new Medicines after you have completely understood and accept all the possible adverse reactions/side effects.   Please note  You were cared for by a hospitalist during your hospital stay. If you have any questions about your discharge medications or the care you received while you were in the hospital after you are discharged, you can call the unit and asked to speak with the hospitalist on call if the hospitalist that took care of you is not available. Once you are discharged, your primary care physician will handle any further medical issues. Please note that NO REFILLS for any  discharge medications will be authorized once you are discharged, as it is imperative that you return to your primary care physician (or establish a relationship with a primary care physician if you do not have one) for your aftercare needs so that they can reassess your need for medications and monitor your lab values. Today   SUBJECTIVE  Mild left lower quadrant abdominal pain. Tolerating full liquid diet  VITAL SIGNS:  Blood pressure 131/86, pulse 98, temperature 98.5 F (36.9 C), temperature source Oral, resp. rate 14, height 4\' 7"  (1.397 m), weight 61.1 kg (134 lb 12.8 oz), SpO2 100 %.  I/O:   Intake/Output Summary (Last 24 hours) at 10/16/16 0845 Last data filed at 10/15/16 1845  Gross per 24 hour  Intake              720 ml  Output                0 ml  Net  720 ml    PHYSICAL EXAMINATION:  GENERAL:  56 y.o.-year-old patient lying in the bed with no acute distress.  EYES: Pupils equal, round, reactive to light and accommodation. No scleral icterus. Extraocular muscles intact.  HEENT: Head atraumatic, normocephalic. Oropharynx and nasopharynx clear.  NECK:  Supple, no jugular venous distention. No thyroid enlargement, no tenderness.  LUNGS: Normal breath sounds bilaterally, no wheezing, rales,rhonchi or crepitation. No use of accessory muscles of respiration.  CARDIOVASCULAR: S1, S2 normal. No murmurs, rubs, or gallops.  ABDOMEN: Soft, Mild tenderness left lower quadrant, non-distended. Bowel sounds present. No organomegaly or mass.  EXTREMITIES: No pedal edema, cyanosis, or clubbing.  NEUROLOGIC: Cranial nerves II through XII are intact. Muscle strength 5/5 in all extremities. Sensation intact. Gait not checked.  PSYCHIATRIC: The patient is alert and oriented x 3.  SKIN: No obvious rash, lesion, or ulcer.   DATA REVIEW:   CBC   Recent Labs Lab 10/15/16 0417  WBC 9.9  HGB 12.1  HCT 37.1  PLT 182    Chemistries   Recent Labs Lab 10/14/16 0800  10/15/16 0417  NA 135 138  K 4.9 4.4  CL 102 107  CO2 24 25  GLUCOSE 193* 101*  BUN 15 14  CREATININE 0.81 0.76  CALCIUM 9.8 8.3*  AST 16  --   ALT 16  --   ALKPHOS 66  --   BILITOT 0.7  --     Microbiology Results   No results found for this or any previous visit (from the past 240 hour(s)).  RADIOLOGY:  Ct Abdomen Pelvis W Contrast  Result Date: 10/14/2016 CLINICAL DATA:  Abdominal pain.  Left lower quadrant pain. EXAM: CT ABDOMEN AND PELVIS WITH CONTRAST TECHNIQUE: Multidetector CT imaging of the abdomen and pelvis was performed using the standard protocol following bolus administration of intravenous contrast. CONTRAST:  ISOVUE-300 IOPAMIDOL (ISOVUE-300) INJECTION 61% COMPARISON:  11/30/2003 FINDINGS: Lower chest: Areas of atelectasis in the lower lobes.  No effusions. Hepatobiliary: Diffuse low-density throughout the liver compatible with fatty infiltration. No focal abnormality. Small gallstone layering in the gallbladder. Pancreas: No focal abnormality or ductal dilatation. Spleen: No focal abnormality.  Normal size. Adrenals/Urinary Tract: No adrenal abnormality. No focal renal abnormality. No stones or hydronephrosis. Urinary bladder is unremarkable. Stomach/Bowel: There is wall thickening and marked surrounding inflammation and fluid at the sigmoid flexure of the colon. There is a diverticulum noted in this area. Findings most likely reflect acute diverticulitis. Scattered diverticula in the left colon. Stomach and small bowel decompressed. Vascular/Lymphatic: Aortic and iliac calcifications. No aneurysm or adenopathy. Reproductive: Prior hysterectomy.  No adnexal masses. Other: No free fluid or free air. Musculoskeletal: No acute bony abnormality. IMPRESSION: Colonic wall thickening and marked surrounding inflammation at the sigmoid flexure where there is a diverticulum noted. Findings likely reflect acute diverticulitis. This could be followed with repeat CT or colonoscopy  after treatment an acute symptoms resolve. Cholelithiasis. Fatty liver. Aortoiliac atherosclerosis. Electronically Signed   By: Charlett Nose M.D.   On: 10/14/2016 09:43     Management plans discussed with the patient, family and they are in agreement.  CODE STATUS:     Code Status Orders        Start     Ordered   10/14/16 1153  Full code  Continuous     10/14/16 1153    Code Status History    Date Active Date Inactive Code Status Order ID Comments User Context   This patient has a current  code status but no historical code status.      TOTAL TIME TAKING CARE OF THIS PATIENT: 40 minutes.    Makhya Arave M.D on 10/16/2016 at 8:45 AM  Between 7am to 6pm - Pager - [phone] After 6pm go to [email or web address] - password [email or web address]  [facility] [place] [facility]  Office  [phone]  CC: Primary care physician; Rayetta HumphreyGeorge, Sionne A, MD

## 2016-10-18 ENCOUNTER — Ambulatory Visit
Admission: RE | Admit: 2016-10-18 | Discharge: 2016-10-18 | Disposition: A | Discharge status: Home / Self Care | Payer: Medicaid Other | Source: Ambulatory Visit | Attending: Gastroenterology | Admitting: Gastroenterology

## 2016-10-18 DIAGNOSIS — R1319 Other dysphagia: Secondary | ICD-10-CM

## 2016-10-23 DIAGNOSIS — K5792 Diverticulitis of intestine, part unspecified, without perforation or abscess without bleeding: Secondary | ICD-10-CM

## 2016-10-23 DIAGNOSIS — M5136 Other intervertebral disc degeneration, lumbar region: Secondary | ICD-10-CM | POA: Insufficient documentation

## 2016-10-23 HISTORY — DX: Diverticulitis of intestine, part unspecified, without perforation or abscess without bleeding: K57.92

## 2016-10-24 ENCOUNTER — Other Ambulatory Visit: Payer: Self-pay | Admitting: Orthopedic Surgery

## 2016-10-24 DIAGNOSIS — G8929 Other chronic pain: Secondary | ICD-10-CM

## 2016-10-24 DIAGNOSIS — M5441 Lumbago with sciatica, right side: Principal | ICD-10-CM

## 2016-10-24 DIAGNOSIS — M5442 Lumbago with sciatica, left side: Principal | ICD-10-CM

## 2016-10-25 DIAGNOSIS — K760 Fatty (change of) liver, not elsewhere classified: Secondary | ICD-10-CM | POA: Insufficient documentation

## 2016-10-30 ENCOUNTER — Ambulatory Visit
Admission: RE | Admit: 2016-10-30 | Discharge: 2016-10-30 | Disposition: A | Discharge status: Home / Self Care | Payer: Medicaid Other | Source: Ambulatory Visit | Attending: Orthopedic Surgery | Admitting: Orthopedic Surgery

## 2016-10-30 DIAGNOSIS — M4804 Spinal stenosis, thoracic region: Secondary | ICD-10-CM | POA: Diagnosis not present

## 2016-10-30 DIAGNOSIS — M5134 Other intervertebral disc degeneration, thoracic region: Secondary | ICD-10-CM | POA: Diagnosis not present

## 2016-10-30 DIAGNOSIS — G8929 Other chronic pain: Secondary | ICD-10-CM | POA: Diagnosis not present

## 2016-10-30 DIAGNOSIS — M5136 Other intervertebral disc degeneration, lumbar region: Secondary | ICD-10-CM | POA: Diagnosis not present

## 2016-10-30 DIAGNOSIS — M5441 Lumbago with sciatica, right side: Secondary | ICD-10-CM

## 2016-10-30 DIAGNOSIS — M5126 Other intervertebral disc displacement, lumbar region: Secondary | ICD-10-CM | POA: Insufficient documentation

## 2016-10-30 DIAGNOSIS — M5442 Lumbago with sciatica, left side: Secondary | ICD-10-CM | POA: Diagnosis not present

## 2016-11-08 DIAGNOSIS — H8110 Benign paroxysmal vertigo, unspecified ear: Secondary | ICD-10-CM | POA: Insufficient documentation

## 2016-12-05 ENCOUNTER — Telehealth: Payer: Self-pay | Admitting: *Deleted
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
## 2016-12-12 ENCOUNTER — Encounter: Payer: Self-pay | Admitting: Dietician

## 2016-12-12 ENCOUNTER — Encounter: Payer: Medicaid Other | Attending: Family Medicine | Admitting: Dietician

## 2016-12-12 VITALS — BP 100/76 | Ht <= 58 in | Wt 128.2 lb

## 2016-12-12 DIAGNOSIS — E119 Type 2 diabetes mellitus without complications: Secondary | ICD-10-CM | POA: Diagnosis not present

## 2016-12-12 DIAGNOSIS — Z713 Dietary counseling and surveillance: Secondary | ICD-10-CM | POA: Diagnosis not present

## 2016-12-12 NOTE — Progress Notes (Signed)

## 2016-12-25 ENCOUNTER — Other Ambulatory Visit: Payer: Self-pay | Admitting: Gastroenterology

## 2016-12-25 DIAGNOSIS — R1319 Other dysphagia: Secondary | ICD-10-CM

## 2016-12-31 ENCOUNTER — Telehealth: Payer: Self-pay | Admitting: Dietician

## 2016-12-31 NOTE — Telephone Encounter (Signed)
Called pt reminding her of upcoming appointment for class 2 on Thursday 01-02-17 at 9a

## 2017-01-02 ENCOUNTER — Telehealth: Payer: Self-pay | Admitting: *Deleted

## 2017-01-02 ENCOUNTER — Ambulatory Visit: Payer: Medicaid Other

## 2017-01-02 NOTE — Telephone Encounter (Signed)
Patient left a message that she would miss Diabetes class because her father died. She wanted to reschedule. Called her and Class 2 will be rescheduled for Nov 8.

## 2017-01-27 ENCOUNTER — Ambulatory Visit: Payer: Medicaid Other

## 2017-01-28 ENCOUNTER — Ambulatory Visit
Admission: RE | Admit: 2017-01-28 | Discharge: 2017-01-28 | Disposition: A | Discharge status: Home / Self Care | Payer: Medicaid Other | Source: Ambulatory Visit | Attending: Gastroenterology | Admitting: Gastroenterology

## 2017-01-28 DIAGNOSIS — R1319 Other dysphagia: Secondary | ICD-10-CM

## 2017-01-28 DIAGNOSIS — R1310 Dysphagia, unspecified: Secondary | ICD-10-CM | POA: Insufficient documentation

## 2017-01-28 DIAGNOSIS — K219 Gastro-esophageal reflux disease without esophagitis: Secondary | ICD-10-CM | POA: Insufficient documentation

## 2017-01-30 ENCOUNTER — Encounter: Payer: Medicaid Other | Attending: Family Medicine | Admitting: Dietician

## 2017-01-30 ENCOUNTER — Encounter: Payer: Self-pay | Admitting: Dietician

## 2017-01-30 VITALS — Wt 129.1 lb

## 2017-01-30 DIAGNOSIS — Z713 Dietary counseling and surveillance: Secondary | ICD-10-CM | POA: Insufficient documentation

## 2017-01-30 DIAGNOSIS — E119 Type 2 diabetes mellitus without complications: Secondary | ICD-10-CM | POA: Insufficient documentation

## 2017-01-30 NOTE — Progress Notes (Signed)

## 2017-02-11 ENCOUNTER — Encounter: Payer: Self-pay | Admitting: Dietician

## 2017-02-19 ENCOUNTER — Encounter: Payer: Self-pay | Admitting: *Deleted

## 2017-02-20 ENCOUNTER — Ambulatory Visit: Payer: Medicaid Other | Admitting: Anesthesiology

## 2017-02-20 ENCOUNTER — Encounter: Payer: Self-pay | Admitting: *Deleted

## 2017-02-20 ENCOUNTER — Encounter
Admission: RE | Disposition: A | Discharge status: Home / Self Care | Payer: Self-pay | Source: Ambulatory Visit | Attending: Gastroenterology

## 2017-02-20 ENCOUNTER — Ambulatory Visit
Admission: RE | Admit: 2017-02-20 | Discharge: 2017-02-20 | Disposition: A | Discharge status: Home / Self Care | Payer: Medicaid Other | Source: Ambulatory Visit | Attending: Gastroenterology | Admitting: Gastroenterology

## 2017-02-20 DIAGNOSIS — K648 Other hemorrhoids: Secondary | ICD-10-CM | POA: Insufficient documentation

## 2017-02-20 DIAGNOSIS — K225 Diverticulum of esophagus, acquired: Secondary | ICD-10-CM | POA: Diagnosis not present

## 2017-02-20 DIAGNOSIS — K2950 Unspecified chronic gastritis without bleeding: Secondary | ICD-10-CM | POA: Insufficient documentation

## 2017-02-20 DIAGNOSIS — R1310 Dysphagia, unspecified: Secondary | ICD-10-CM | POA: Diagnosis not present

## 2017-02-20 DIAGNOSIS — Z885 Allergy status to narcotic agent status: Secondary | ICD-10-CM | POA: Diagnosis not present

## 2017-02-20 DIAGNOSIS — R1084 Generalized abdominal pain: Secondary | ICD-10-CM | POA: Diagnosis present

## 2017-02-20 DIAGNOSIS — K210 Gastro-esophageal reflux disease with esophagitis: Secondary | ICD-10-CM | POA: Diagnosis not present

## 2017-02-20 DIAGNOSIS — J449 Chronic obstructive pulmonary disease, unspecified: Secondary | ICD-10-CM | POA: Diagnosis not present

## 2017-02-20 DIAGNOSIS — K5730 Diverticulosis of large intestine without perforation or abscess without bleeding: Secondary | ICD-10-CM | POA: Diagnosis not present

## 2017-02-20 DIAGNOSIS — Z882 Allergy status to sulfonamides status: Secondary | ICD-10-CM | POA: Insufficient documentation

## 2017-02-20 DIAGNOSIS — Z7982 Long term (current) use of aspirin: Secondary | ICD-10-CM | POA: Insufficient documentation

## 2017-02-20 DIAGNOSIS — K3189 Other diseases of stomach and duodenum: Secondary | ICD-10-CM | POA: Insufficient documentation

## 2017-02-20 DIAGNOSIS — F329 Major depressive disorder, single episode, unspecified: Secondary | ICD-10-CM | POA: Diagnosis not present

## 2017-02-20 DIAGNOSIS — Z79899 Other long term (current) drug therapy: Secondary | ICD-10-CM | POA: Diagnosis not present

## 2017-02-20 DIAGNOSIS — E785 Hyperlipidemia, unspecified: Secondary | ICD-10-CM | POA: Insufficient documentation

## 2017-02-20 DIAGNOSIS — Z888 Allergy status to other drugs, medicaments and biological substances status: Secondary | ICD-10-CM | POA: Insufficient documentation

## 2017-02-20 DIAGNOSIS — F17200 Nicotine dependence, unspecified, uncomplicated: Secondary | ICD-10-CM | POA: Diagnosis not present

## 2017-02-20 DIAGNOSIS — E119 Type 2 diabetes mellitus without complications: Secondary | ICD-10-CM | POA: Insufficient documentation

## 2017-02-20 DIAGNOSIS — Z794 Long term (current) use of insulin: Secondary | ICD-10-CM | POA: Insufficient documentation

## 2017-02-20 DIAGNOSIS — K760 Fatty (change of) liver, not elsewhere classified: Secondary | ICD-10-CM | POA: Insufficient documentation

## 2017-02-20 DIAGNOSIS — M797 Fibromyalgia: Secondary | ICD-10-CM | POA: Diagnosis not present

## 2017-02-20 DIAGNOSIS — I10 Essential (primary) hypertension: Secondary | ICD-10-CM | POA: Insufficient documentation

## 2017-02-20 HISTORY — PX: COLONOSCOPY WITH PROPOFOL: SHX5780

## 2017-02-20 HISTORY — PX: ESOPHAGOGASTRODUODENOSCOPY (EGD) WITH PROPOFOL: SHX5813

## 2017-02-20 HISTORY — DX: Depression, unspecified: F32.A

## 2017-02-20 HISTORY — DX: Major depressive disorder, single episode, unspecified: F32.9

## 2017-02-20 HISTORY — DX: Fatty (change of) liver, not elsewhere classified: K76.0

## 2017-02-20 LAB — GLUCOSE, CAPILLARY: Glucose-Capillary: 104 mg/dL — ABNORMAL HIGH (ref 65–99)

## 2017-02-20 SURGERY — ESOPHAGOGASTRODUODENOSCOPY (EGD) WITH PROPOFOL
Anesthesia: General

## 2017-02-20 MED ORDER — MIDAZOLAM HCL 2 MG/2ML IJ SOLN
INTRAMUSCULAR | Status: DC | PRN
  Administered 2017-02-20 (×3): 1 mg via INTRAVENOUS

## 2017-02-20 MED ORDER — SODIUM CHLORIDE 0.9 % IV SOLN
INTRAVENOUS | Status: DC
  Administered 2017-02-20: 10:00:00 via INTRAVENOUS

## 2017-02-20 MED ORDER — FENTANYL CITRATE (PF) 100 MCG/2ML IJ SOLN
INTRAMUSCULAR | Status: AC
  Filled 2017-02-20: qty 2

## 2017-02-20 MED ORDER — PROPOFOL 10 MG/ML IV BOLUS
INTRAVENOUS | Status: AC
  Filled 2017-02-20: qty 20

## 2017-02-20 MED ORDER — MIDAZOLAM HCL 2 MG/2ML IJ SOLN
INTRAMUSCULAR | Status: AC
  Filled 2017-02-20: qty 2

## 2017-02-20 MED ORDER — FENTANYL CITRATE (PF) 100 MCG/2ML IJ SOLN
INTRAMUSCULAR | Status: DC | PRN
  Administered 2017-02-20 (×3): 50 ug via INTRAVENOUS

## 2017-02-20 MED ORDER — PROPOFOL 10 MG/ML IV BOLUS
INTRAVENOUS | Status: DC | PRN
Start: 2017-02-20 — End: 2017-02-20
  Administered 2017-02-20: 30 mg via INTRAVENOUS

## 2017-02-20 MED ORDER — LIDOCAINE HCL (PF) 2 % IJ SOLN
INTRAMUSCULAR | Status: AC
  Filled 2017-02-20: qty 10

## 2017-02-20 MED ORDER — SODIUM CHLORIDE 0.9 % IV SOLN: INTRAVENOUS | Status: DC

## 2017-02-20 MED ORDER — PROPOFOL 500 MG/50ML IV EMUL
INTRAVENOUS | Status: DC | PRN
  Administered 2017-02-20: 120 ug/kg/min via INTRAVENOUS

## 2017-02-20 NOTE — Anesthesia Preprocedure Evaluation (Addendum)
Anesthesia Evaluation  Patient identified by MRN, date of birth, ID band Patient awake    Reviewed: Allergy & Precautions, NPO status , Patient's Chart, lab work & pertinent test results  Airway Mallampati: II       Dental  (+) Missing   Pulmonary COPD,  COPD inhaler, Current Smoker,     + decreased breath sounds      Cardiovascular Exercise Tolerance: Good hypertension, Pt. on medications  Rhythm:Regular     Neuro/Psych Depression    GI/Hepatic GERD  Medicated,  Endo/Other  diabetes, Type 1, Insulin Dependent  Renal/GU      Musculoskeletal   Abdominal Normal abdominal exam  (+)   Peds negative pediatric ROS (+)  Hematology negative hematology ROS (+)   Anesthesia Other Findings   Reproductive/Obstetrics                            Anesthesia Physical Anesthesia Plan  ASA: III  Anesthesia Plan: General   Post-op Pain Management:    Induction: Intravenous  PONV Risk Score and Plan:   Airway Management Planned: Natural Airway and Nasal Cannula  Additional Equipment:   Intra-op Plan:   Post-operative Plan:   Informed Consent: I have reviewed the patients History and Physical, chart, labs and discussed the procedure including the risks, benefits and alternatives for the proposed anesthesia with the patient or authorized representative who has indicated his/her understanding and acceptance.     Plan Discussed with: Surgeon  Anesthesia Plan Comments:         Anesthesia Quick Evaluation

## 2017-02-20 NOTE — Anesthesia Post-op Follow-up Note (Signed)
Anesthesia QCDR form completed.        

## 2017-02-20 NOTE — Anesthesia Postprocedure Evaluation (Signed)
Anesthesia Post Note  Patient: [name]  Procedure(s) Performed: ESOPHAGOGASTRODUODENOSCOPY (EGD) WITH PROPOFOL (N/A ) COLONOSCOPY WITH PROPOFOL (N/A )  Patient location during evaluation: PACU Anesthesia Type: General Level of consciousness: awake Pain management: pain level controlled Vital Signs Assessment: post-procedure vital signs reviewed and stable Respiratory status: spontaneous breathing Anesthetic complications: no     Last Vitals:  Vitals:   02/20/17 1159 02/20/17 1209  BP: 110/78 115/82  Pulse: 92 95  Resp: 17 18  Temp:    SpO2: 96% 96%    Last Pain:  Vitals:   02/20/17 1139  TempSrc: Tympanic                 VAN STAVEREN,Cylie Dor

## 2017-02-20 NOTE — Op Note (Signed)
[facility] Gastroenterology Patient Name: Fidela SalisburyJanice Selden Procedure Date: 02/20/2017 10:37 AM MRN: [id number] Account #: [id number] Date of Birth: 1960/07/07 Admit Type: Outpatient Age: [age] [facility] ROOM 4 Gender: Female Note Status: Finalized Procedure:            Colonoscopy Indications:          Generalized abdominal pain Providers:            Christena DeemMartin U. Orly Quimby, MD Referring MD:         Marylin CrosbySionne A. Greggory StallionGeorge MD, MD (Referring MD) Medicines:            Monitored Anesthesia Care Complications:        No immediate complications. Procedure:            Pre-Anesthesia Assessment:                       - ASA Grade Assessment: III - A patient with severe                        systemic disease.                       After obtaining informed consent, the colonoscope was                        passed under direct vision. Throughout the procedure,                        the patient's blood pressure, pulse, and oxygen                        saturations were monitored continuously. The                        Colonoscope was introduced through the anus and                        advanced to the the cecum, identified by appendiceal                        orifice and ileocecal valve. The colonoscopy was                        performed without difficulty. The patient tolerated the                        procedure well. The quality of the bowel preparation                        was fair. Findings:      Multiple small-mouthed diverticula were found in the sigmoid colon and       descending colon.      Three sessile polyps were found in the sigmoid colon. The polyps were 1       to 2 mm in size. These polyps were removed with a cold biopsy forceps.       Resection and retrieval were complete.      A 2 mm polyp was found in the rectum. The polyp was sessile. The polyp       was removed with a cold biopsy forceps. Resection and retrieval were  complete.  Non-bleeding internal hemorrhoids were found during anoscopy. The       hemorrhoids were small.      The digital rectal exam was normal otherwise.      The exam was otherwise without abnormality. Impression:           - Preparation of the colon was fair.                       - Diverticulosis in the sigmoid colon and in the                        descending colon.                       - Three 1 to 2 mm polyps in the sigmoid colon, removed                        with a cold biopsy forceps. Resected and retrieved.                       - One 2 mm polyp in the rectum, removed with a cold                        biopsy forceps. Resected and retrieved.                       - Non-bleeding internal hemorrhoids.                       - The examination was otherwise normal. Recommendation:       - Discharge patient to home.                       - Use Linzess (linaclotide) 145 mcg PO daily.                       - Miralax 1 capful (17 grams) in 8 ounces of water PO                        daily.                       - Return to GI clinic in 1 month. Procedure Code(s):    --- Professional ---                       [phone], Colonoscopy, flexible; with biopsy, single or                        multiple Diagnosis Code(s):    --- Professional ---                       K64.8, Other hemorrhoids                       D12.5, Benign neoplasm of sigmoid colon                       K62.1, Rectal polyp                       R10.84, Generalized abdominal pain  K57.30, Diverticulosis of large intestine without                        perforation or abscess without bleeding CPT copyright 2016 American [employer] Association. All rights reserved. The codes documented in this report are preliminary and upon coder review may  be revised to meet current compliance requirements. Christena Deem, MD 02/20/2017 11:38:31 AM This report has been signed electronically. Number of Addenda: 0 Note  Initiated On: 02/20/2017 10:37 AM Scope Withdrawal Time: 0 hours 12 minutes 46 seconds  Total Procedure Duration: 0 hours 20 minutes 35 seconds       [facility]

## 2017-02-20 NOTE — Transfer of Care (Signed)
Immediate Anesthesia Transfer of Care Note  Patient: [name]  Procedure(s) Performed: ESOPHAGOGASTRODUODENOSCOPY (EGD) WITH PROPOFOL (N/A ) COLONOSCOPY WITH PROPOFOL (N/A )  Patient Location: PACU  Anesthesia Type:General  Level of Consciousness: awake  Airway & Oxygen Therapy: Patient Spontanous Breathing and Patient connected to nasal cannula oxygen  Post-op Assessment: Report given to RN and Post -op Vital signs reviewed and stable  Post vital signs: Reviewed  Last Vitals:  Vitals:   02/20/17 0944  BP: (!) 160/108  Pulse: 94  Resp: 14  Temp: (!) 36.1 C  SpO2: 97%    Last Pain:  Vitals:   02/20/17 0944  TempSrc: Tympanic         Complications: No apparent anesthesia complications

## 2017-02-20 NOTE — Op Note (Signed)
[facility] Gastroenterology Patient Name: Erika SalisburyJanice Yohannes Procedure Date: 02/20/2017 10:37 AM MRN: [id number] Account #: [id number] Date of Birth: January 05, 1961 Admit Type: Outpatient Age: [age] Room[facility] 4 Gender: Female Note Status: Finalized Procedure:            Upper GI endoscopy Indications:          Generalized abdominal pain, Dysphagia Providers:            Christena DeemMartin U. Ronnica Dreese, MD Referring MD:         Marylin CrosbySionne A. Greggory StallionGeorge MD, MD (Referring MD) Medicines:            Monitored Anesthesia Care Complications:        No immediate complications. Procedure:            Pre-Anesthesia Assessment:                       - ASA Grade Assessment: III - A patient with severe                        systemic disease.                       After obtaining informed consent, the endoscope was                        passed under direct vision. Throughout the procedure,                        the patient's blood pressure, pulse, and oxygen                        saturations were monitored continuously. The Endoscope                        was introduced through the mouth, and advanced to the                        fourth part of duodenum. The upper GI endoscopy was                        accomplished without difficulty. The patient tolerated                        the procedure well. Findings:      A small, intermittant appearing non-bleeding diverticulum and no       stigmata of recent bleeding was found in the mid esophagus.      LA Grade A (one or more mucosal breaks less than 5 mm, not extending       between tops of 2 mucosal folds) esophagitis with no bleeding was found.       Biopsies were taken with a cold forceps for histology.      Diffuse and patchy minimal inflammation characterized by congestion       (edema) and erythema was found in the gastric body. Biopsies were taken       with a cold forceps for histology.      A single 40 mm, solid to palpation,  submucosal papule (nodule) with no       bleeding and no stigmata of recent bleeding was found on the greater  curvature of the gastric antrum. Biopsies were taken with a cold forceps       for histology.      The cardia and gastric fundus were normal on retroflexion.      Diffuse and patchy mild mucosal variance characterized by altered       texture was found in the entire duodenum. Biopsies were taken with a       cold forceps for histology. 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                       - LA Grade A reflux esophagitis. Biopsied.                       - Bile gastritis. Biopsied.                       - A single submucosal papule (nodule) found in the                        stomach. Biopsied.                       - Mucosal variant in the duodenum. Biopsied. Recommendation:       - Use Protonix (pantoprazole) 40 mg PO daily daily.                       - Return to GI clinic in 6 weeks.                       - Perform an upper endoscopic ultrasound (UEUS) at                        appointment to be scheduled. Procedure Code(s):    --- Professional ---                       [phone], Esophagogastroduodenoscopy, flexible, transoral;                        with biopsy, single or multiple Diagnosis Code(s):    --- Professional ---                       Q39.6, Congenital diverticulum of esophagus                       K21.0, Gastro-esophageal reflux disease with esophagitis                       K29.60, Other gastritis without bleeding                       K31.89, Other diseases of stomach and duodenum                       R10.84, Generalized abdominal pain                       R13.10, Dysphagia, unspecified CPT copyright 2016 American Medical Association. All rights reserved. The codes documented in this report are preliminary and upon coder review may  be revised to meet current compliance requirements. Cindra Eves  Marva PandaSkulskie, MD 02/20/2017 11:11:48  AM This report has been signed electronically. Number of Addenda: 0 Note Initiated On: 02/20/2017 10:37 AM      [facility]

## 2017-02-20 NOTE — H&P (Signed)
Outpatient short stay form Pre-procedure 02/20/2017 10:42 AM Christena DeemMartin U Joshalyn Ancheta MD  Primary Physician: Dr Angus PalmsSionne George  Reason for visit:  EGD and colonoscopy  History of present illness:  Patient is a 56 year old female presenting today as above. She has a history of generalized abdominal discomfort as well as gas after reflux and dysphagia. 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 She tolerated her prep well. She does take a daily 81 mg aspirin. This there was some confusion initially as to whether this was 325 or not but in further discussion with her this sounds much more like a 81 mg aspirin. She continues to take that through yesterday. She takes no other aspirin product or blood thinning agent.    Current Facility-Administered Medications:  .  0.9 %  sodium chloride infusion, , Intravenous, Continuous, Christena DeemSkulskie, Kayah Hecker U, MD, Last Rate: 20 mL/hr at 02/20/17 1002 .  0.9 %  sodium chloride infusion, , Intravenous, Continuous, Christena DeemSkulskie, Jacara Benito U, MD  Medications Prior to Admission  Medication Sig Dispense Refill Last Dose  . albuterol (PROVENTIL HFA;VENTOLIN HFA) 108 (90 Base) MCG/ACT inhaler Inhale 1 puff into the lungs 2 (two) times daily as needed.   Past Week at Unknown time  . aspirin 81 MG tablet Take 81 mg by mouth daily.    02/19/2017 at Unknown time  . citalopram (CELEXA) 40 MG tablet Take 40 mg by mouth daily.   02/19/2017 at Unknown time  . cyclobenzaprine (FLEXERIL) 10 MG tablet Take 1 tablet (10 mg total) by mouth 3 (three) times daily. 30 tablet 0 02/19/2017 at Unknown time  . fluticasone (FLONASE) 50 MCG/ACT nasal spray Place 1 spray into both nostrils daily.   Past Week at Unknown time  . gabapentin (NEURONTIN) 300 MG capsule Take 3 capsules by mouth 3 (three) times daily.   02/19/2017 at Unknown time  . HYDROcodone-acetaminophen (NORCO/VICODIN) 5-325 MG tablet Take 1  tablet by mouth every 4 (four) hours as needed.   Past Week at Unknown time  . lisinopril (PRINIVIL,ZESTRIL) 10 MG tablet Take 10 mg by mouth daily.    02/20/2017 at 0600  . meclizine (ANTIVERT) 25 MG tablet Take 25 mg by mouth 3 (three) times daily as needed for dizziness.   02/19/2017 at Unknown time  . meloxicam (MOBIC) 15 MG tablet Take 15 mg by mouth daily.   02/19/2017 at Unknown time  . ondansetron (ZOFRAN) 4 MG tablet Take 1 tablet by mouth every 8 (eight) hours as needed.   Past Week at Unknown time  . pantoprazole (PROTONIX) 40 MG tablet Take 40 mg by mouth daily.   02/19/2017 at Unknown time  . rosuvastatin (CRESTOR) 20 MG tablet Take 20 mg by mouth daily.   02/19/2017 at Unknown time  . topiramate (TOPAMAX) 25 MG tablet Take 25 mg by mouth 2 (two) times daily.   02/19/2017 at 0800  . traZODone (DESYREL) 100 MG tablet Take 1 tablet by mouth at bedtime as needed.   02/19/2017 at 2100  . Insulin Detemir (LEVEMIR) 100 UNIT/ML Pen Inject 20 Units into the skin at bedtime. (Patient taking differently: Inject 24 Units into the skin at bedtime. ) 15 mL 11 02/18/2017  . metFORMIN (GLUCOPHAGE) 1000 MG tablet Take 1 tablet by mouth 2 (two) times daily.   02/18/2017  . nicotine (NICODERM CQ - DOSED IN MG/24 HOURS) 21 mg/24hr patch Place 21 mg onto the skin daily.  Taking  . polyethylene glycol (MIRALAX / GLYCOLAX) packet Take 17 g by mouth daily.   Taking  . rizatriptan (MAXALT) 10 MG tablet Take 10 mg by mouth as needed for migraine. May repeat in 2 hours if needed   Taking     Allergies  Allergen Reactions  . Tramadol Itching    Headaches and confusion.  . Benadryl [Diphenhydramine Hcl] Itching  . Bextra [Valdecoxib] Itching  . Invokana [Canagliflozin] Other (See Comments)    Causes yeast infection  . Sulfamethoxazole-Trimethoprim Other (See Comments)    "Makes me feel weird"     Past Medical History:  Diagnosis Date  . Asthma   . Asthma   . Depression   . Diabetes mellitus   .  Fatty liver   . Fibromyalgia   . Fibromyalgia   . GERD (gastroesophageal reflux disease)   . History of degenerative disc disease   . Hyperlipidemia   . Hypertension     Review of systems:      Physical Exam    Heart and lungs: Regular rate and rhythm without rub or gallop, lungs are bilaterally clear.    HEENT: Normocephalic atraumatic eyes are anicteric    Other:     Pertinant exam for procedure: Soft nontender nondistended bowel sounds positive normoactive.    Planned proceedures: EGD, colonoscopy and indicated procedures. I have discussed the risks benefits and complications of procedures to include not limited to bleeding, infection, perforation and the risk of sedation and the patient wishes to proceed.    Christena DeemMartin U Kynsie Falkner, MD Gastroenterology 02/20/2017  10:42 AM

## 2017-02-21 NOTE — OR Nursing (Signed)
PT C/O ABDOMINAL PAIN SCALE 8 OUT 10. PT STATES THAT TEMP IS UNKNOWN ALSO SHE STATES SHE HAS ABDOMINAL DISTENTION. DR Nigel BridgemanSKULKIE , ORDERED ABDOMINAL FILMS . PT IS TO COME IN TO CHECK. TO COME TO ER IF PAIN WORSENS , PT HAS TEMPERATURE OR ANY FURTHER PROMBLEMS.

## 2017-03-11 ENCOUNTER — Telehealth: Payer: Self-pay

## 2017-03-11 ENCOUNTER — Other Ambulatory Visit: Payer: Self-pay

## 2017-03-11 DIAGNOSIS — M160 Bilateral primary osteoarthritis of hip: Secondary | ICD-10-CM | POA: Insufficient documentation

## 2017-03-11 NOTE — Telephone Encounter (Signed)
Received referral for EUS. EUS scheduled for 04/03/17 with Dr. Shana ChuteBurbridge at [place]. Went over instructions below and copy mailed to home address. Takes asa 81mg  daily. Does not need to hold. She verbalized and read back instructions.  INSTRUCTIONS FOR ENDOSCOPIC ULTRASOUND -Your procedure has been scheduled for January 10th with Dr. Shana ChuteBurbridge at [facility]. -The hospital may contact you to pre-register over the phone.  -To get your scheduled arrival time, please call the Endoscopy unit at  [phone] between 1-3 p.m. on:  January 9th   -ON THE DAY OF YOU PROCEDURE:   1. If you are scheduled for a morning procedure, nothing to drink after midnight  -If you are scheduled for an afternoon procedure, you may have clear liquids until 5 hours prior  to the procedure but no carbonated drinks or broth  2. NO FOOD THE DAY OF YOUR PROCEDURE  3. You may take your heart, seizure, blood pressure, Parkinson's or breathing medications at  6am with just enough water to get your pills down  4. Do not take any oral Diabetic medications the morning of your procedure.  5. If you are a diabetic and are using insulin, please notify your prescribing physician of this  procedure as your dose may need to be altered related to not being able to eat or drink.   5. Do not take vitamins, iron, or fish oil for 5 days before your procedure     -On the day of your procedure, come to [facility] Admitting/Registration desk (First desk on the right) at the scheduled arrival time. You MUST have someone drive you home from your procedure. You must have a responsible adult with a valid driver's license who is on site throughout your entire procedure and who can stay with you for several hours after your procedure. You may not go home alone in a taxi, shuttle [place] or bus, as the drivers will not be responsible for you.  --If you have any questions please call me at the above contact Oncology Nurse  Navigator Documentation  Navigator Location: [facility] (03/11/17 1200)   )Navigator Encounter Type: Telephone (03/11/17 1200) Telephone: Kathrin Pennerutgoing Call (03/11/17 1200)                       Barriers/Navigation Needs: Coordination of Care (03/11/17 1200)   Interventions: Coordination of Care (03/11/17 1200)   Coordination of Care: EUS (03/11/17 1200)                  Time Spent with Patient: 30 (03/11/17 1200)

## 2017-03-13 LAB — SURGICAL PATHOLOGY

## 2017-04-02 ENCOUNTER — Encounter: Payer: Self-pay | Admitting: Emergency Medicine

## 2017-04-03 ENCOUNTER — Ambulatory Visit: Payer: Medicaid Other | Admitting: Anesthesiology

## 2017-04-03 ENCOUNTER — Encounter
Admission: RE | Disposition: A | Discharge status: Home / Self Care | Payer: Self-pay | Source: Ambulatory Visit | Attending: Internal Medicine

## 2017-04-03 ENCOUNTER — Ambulatory Visit
Admission: RE | Admit: 2017-04-03 | Discharge: 2017-04-03 | Disposition: A | Discharge status: Home / Self Care | Payer: Medicaid Other | Source: Ambulatory Visit | Attending: Internal Medicine | Admitting: Internal Medicine

## 2017-04-03 ENCOUNTER — Encounter: Payer: Self-pay | Admitting: Anesthesiology

## 2017-04-03 DIAGNOSIS — M797 Fibromyalgia: Secondary | ICD-10-CM | POA: Insufficient documentation

## 2017-04-03 DIAGNOSIS — E119 Type 2 diabetes mellitus without complications: Secondary | ICD-10-CM | POA: Diagnosis not present

## 2017-04-03 DIAGNOSIS — K219 Gastro-esophageal reflux disease without esophagitis: Secondary | ICD-10-CM | POA: Diagnosis not present

## 2017-04-03 DIAGNOSIS — I10 Essential (primary) hypertension: Secondary | ICD-10-CM | POA: Insufficient documentation

## 2017-04-03 DIAGNOSIS — J449 Chronic obstructive pulmonary disease, unspecified: Secondary | ICD-10-CM | POA: Insufficient documentation

## 2017-04-03 DIAGNOSIS — F17210 Nicotine dependence, cigarettes, uncomplicated: Secondary | ICD-10-CM | POA: Diagnosis not present

## 2017-04-03 DIAGNOSIS — E785 Hyperlipidemia, unspecified: Secondary | ICD-10-CM | POA: Insufficient documentation

## 2017-04-03 DIAGNOSIS — F329 Major depressive disorder, single episode, unspecified: Secondary | ICD-10-CM | POA: Diagnosis not present

## 2017-04-03 DIAGNOSIS — K3189 Other diseases of stomach and duodenum: Secondary | ICD-10-CM | POA: Insufficient documentation

## 2017-04-03 HISTORY — DX: Other intervertebral disc degeneration, lumbar region without mention of lumbar back pain or lower extremity pain: M51.369

## 2017-04-03 HISTORY — PX: UPPER ESOPHAGEAL ENDOSCOPIC ULTRASOUND (EUS): SHX6562

## 2017-04-03 HISTORY — DX: Benign paroxysmal vertigo, unspecified ear: H81.10

## 2017-04-03 HISTORY — DX: Other intervertebral disc degeneration, lumbar region: M51.36

## 2017-04-03 LAB — GLUCOSE, CAPILLARY: GLUCOSE-CAPILLARY: 125 mg/dL — AB (ref 65–99)

## 2017-04-03 SURGERY — UPPER ESOPHAGEAL ENDOSCOPIC ULTRASOUND (EUS)
Anesthesia: General

## 2017-04-03 MED ORDER — LIDOCAINE HCL (PF) 2 % IJ SOLN
INTRAMUSCULAR | Status: AC
  Filled 2017-04-03: qty 10

## 2017-04-03 MED ORDER — LIDOCAINE HCL (CARDIAC) 20 MG/ML IV SOLN
INTRAVENOUS | Status: DC | PRN
  Administered 2017-04-03: 30 mg via INTRAVENOUS

## 2017-04-03 MED ORDER — PHENYLEPHRINE HCL 10 MG/ML IJ SOLN
INTRAMUSCULAR | Status: AC
  Filled 2017-04-03: qty 1

## 2017-04-03 MED ORDER — MIDAZOLAM HCL 2 MG/2ML IJ SOLN
INTRAMUSCULAR | Status: DC | PRN
  Administered 2017-04-03: 2 mg via INTRAVENOUS

## 2017-04-03 MED ORDER — PROPOFOL 500 MG/50ML IV EMUL
INTRAVENOUS | Status: AC
  Filled 2017-04-03: qty 50

## 2017-04-03 MED ORDER — SODIUM CHLORIDE 0.9 % IV SOLN
INTRAVENOUS | Status: DC
  Administered 2017-04-03: 1000 mL via INTRAVENOUS

## 2017-04-03 MED ORDER — MIDAZOLAM HCL 2 MG/2ML IJ SOLN
INTRAMUSCULAR | Status: AC
  Filled 2017-04-03: qty 2

## 2017-04-03 MED ORDER — FENTANYL CITRATE (PF) 100 MCG/2ML IJ SOLN
INTRAMUSCULAR | Status: DC | PRN
  Administered 2017-04-03: 50 ug via INTRAVENOUS

## 2017-04-03 MED ORDER — PHENYLEPHRINE HCL 10 MG/ML IJ SOLN
INTRAMUSCULAR | Status: DC | PRN
  Administered 2017-04-03 (×2): 50 ug via INTRAVENOUS

## 2017-04-03 MED ORDER — FENTANYL CITRATE (PF) 100 MCG/2ML IJ SOLN
INTRAMUSCULAR | Status: AC
  Filled 2017-04-03: qty 2

## 2017-04-03 MED ORDER — PROPOFOL 500 MG/50ML IV EMUL
INTRAVENOUS | Status: DC | PRN
  Administered 2017-04-03: 100 ug/kg/min via INTRAVENOUS

## 2017-04-03 NOTE — Transfer of Care (Signed)
Immediate Anesthesia Transfer of Care Note  Patient: [name]  Procedure(s) Performed: UPPER ESOPHAGEAL ENDOSCOPIC ULTRASOUND (EUS) (N/A )  Patient Location: PACU  Anesthesia Type:General  Level of Consciousness: awake and sedated  Airway & Oxygen Therapy: Patient Spontanous Breathing and Patient connected to nasal cannula oxygen  Post-op Assessment: Report given to RN and Post -op Vital signs reviewed and stable  Post vital signs: Reviewed and stable  Last Vitals:  Vitals:   04/03/17 1049  BP: 118/81  Pulse: 97  Resp: 16  Temp: (!) 36.3 C  SpO2: 99%    Last Pain:  Vitals:   04/03/17 1049  TempSrc: Tympanic         Complications: No apparent anesthesia complications

## 2017-04-03 NOTE — H&P (Signed)
I have reviewed the most recent H&P and agree with the findings.  No contraindications to proceeding with EUS today.

## 2017-04-03 NOTE — Anesthesia Procedure Notes (Addendum)
Performed by: Cook-Martin, Romell Wolden Pre-anesthesia Checklist: Patient identified, Emergency Drugs available, Suction available, Patient being monitored and Timeout performed Patient Re-evaluated:Patient Re-evaluated prior to induction Oxygen Delivery Method: Nasal cannula Preoxygenation: Pre-oxygenation with 100% oxygen Induction Type: IV induction Airway Equipment and Method: Bite block Placement Confirmation: positive ETCO2 and CO2 detector       

## 2017-04-03 NOTE — Anesthesia Post-op Follow-up Note (Signed)
Anesthesia QCDR form completed.        

## 2017-04-03 NOTE — Anesthesia Preprocedure Evaluation (Signed)
Anesthesia Evaluation  Patient identified by MRN, date of birth, ID band Patient awake    Reviewed: Allergy & Precautions, H&P , NPO status , Patient's Chart, lab work & pertinent test results, reviewed documented beta blocker date and time   Airway Mallampati: II   Neck ROM: full    Dental  (+) Poor Dentition   Pulmonary neg pulmonary ROS, asthma , COPD, Current Smoker,    Pulmonary exam normal        Cardiovascular Exercise Tolerance: Poor hypertension, negative cardio ROS Normal cardiovascular exam+ Valvular Problems/Murmurs  Rhythm:regular Rate:Normal     Neuro/Psych PSYCHIATRIC DISORDERS  Neuromuscular disease negative neurological ROS  negative psych ROS   GI/Hepatic negative GI ROS, Neg liver ROS, GERD  Medicated,  Endo/Other  negative endocrine ROSdiabetes  Renal/GU negative Renal ROS  negative genitourinary   Musculoskeletal   Abdominal   Peds  Hematology negative hematology ROS (+)   Anesthesia Other Findings Past Medical History: No date: Asthma No date: Asthma No date: BPPV (benign paroxysmal positional vertigo) No date: Degenerative disc disease, lumbar No date: Depression No date: Diabetes mellitus No date: Fatty liver No date: Fibromyalgia No date: Fibromyalgia No date: GERD (gastroesophageal reflux disease) No date: Heart murmur No date: History of degenerative disc disease No date: Hyperlipidemia No date: Hypertension Past Surgical History: No date: ABDOMINAL HYSTERECTOMY No date: ABDOMINAL SURGERY 02/20/2017: COLONOSCOPY WITH PROPOFOL; N/A     Comment:  Procedure: COLONOSCOPY WITH PROPOFOL;  Surgeon:               Christena DeemSkulskie, Martin U, MD;  Location: [facility] ENDOSCOPY;                Service: Endoscopy;  Laterality: N/A; 02/20/2017: ESOPHAGOGASTRODUODENOSCOPY (EGD) WITH PROPOFOL; N/A     Comment:  Procedure: ESOPHAGOGASTRODUODENOSCOPY (EGD) WITH               PROPOFOL;  Surgeon: Christena DeemSkulskie,  Martin U, MD;  [facility];  Service: Endoscopy;  Laterality: N/A; BMI    Body Mass Index:  25.45 kg/m     Reproductive/Obstetrics negative OB ROS                             Anesthesia Physical Anesthesia Plan  ASA: III  Anesthesia Plan: General   Post-op Pain Management:    Induction:   PONV Risk Score and Plan:   Airway Management Planned:   Additional Equipment:   Intra-op Plan:   Post-operative Plan:   Informed Consent: I have reviewed the patients History and Physical, chart, labs and discussed the procedure including the risks, benefits and alternatives for the proposed anesthesia with the patient or authorized representative who has indicated his/her understanding and acceptance.   Dental Advisory Given  Plan Discussed with: CRNA  Anesthesia Plan Comments:         Anesthesia Quick Evaluation

## 2017-04-03 NOTE — Op Note (Signed)
[facility] Gastroenterology Patient Name: [name] Procedure Date: 04/03/2017 11:36 AM MRN: [id number] Account #: [id number] Date of Birth: [birth year] Admit Type: Outpatient Age: 57 Room: [facility] ENDO ROOM 3 Gender: Female Note Status: Finalized Procedure:            Upper EUS Indications:          Gastric mucosal mass/polyp found on endoscopy Patient Profile:      Refer to note in patient chart for documentation of                        history and physical. Providers:            Murray Hodgkins. [place] Referring MD:         Sionne A. Iona Beard MD, MD (Referring MD), Lollie Sails, MD (Referring MD) Medicines:            Propofol per Anesthesia Complications:        No immediate complications. Procedure:            Pre-Anesthesia Assessment:                       Prior to the procedure, a History and Physical was                        performed, and patient medications and allergies were                        reviewed. The patient is competent. The risks and                        benefits of the procedure and the sedation options and                        risks were discussed with the patient. All questions                        were answered and informed consent was obtained.                        Patient identification and proposed procedure were                        verified by the physician, the nurse and the                        anesthesiologist in the pre-procedure area. Mental                        Status Examination: alert and oriented. Airway                        Examination: normal oropharyngeal airway and neck                        mobility. Respiratory Examination: clear to                        auscultation. CV Examination: normal. Prophylactic  Antibiotics: The patient does not require prophylactic                        antibiotics. Prior Anticoagulants: The patient has               taken no previous anticoagulant or antiplatelet agents.                        ASA Grade Assessment: II - A patient with mild systemic                        disease. After reviewing the risks and benefits, the                        patient was deemed in satisfactory condition to undergo                        the procedure. The anesthesia plan was to use monitored                        anesthesia care (MAC). Immediately prior to                        administration of medications, the patient was                        re-assessed for adequacy to receive sedatives. The                        heart rate, respiratory rate, oxygen saturations, blood                        pressure, adequacy of pulmonary ventilation, and                        response to care were monitored throughout the                        procedure. The physical status of the patient was                        re-assessed after the procedure.                       After obtaining informed consent, the endoscope was                        passed under direct vision. Throughout the procedure,                        the patient's blood pressure, pulse, and oxygen                        saturations were monitored continuously. The EUS GI                        Radial Array [id number] was introduced through the mouth,                        and advanced to the stomach for ultrasound examination  from the esophagus and stomach. The upper EUS was                        accomplished without difficulty. The patient tolerated                        the procedure well. Findings:      Endoscopic Finding :      The examined esophagus was endoscopically normal.      A single medium submucosal papule (nodule) was found on the greater       curvature of the gastric antrum.      The examined duodenum was endoscopically normal.      Endosonographic Finding :      An oval intramural (subepithelial)  lesion was found in the antrum of the       stomach. The lesion was hypoechoic with an anechoic tubular center.       Sonographically, the lesion appeared to originate from the deep mucosa       (Layer 2) and submucosa (Layer 3). The lesion measured 12.2 mm by 8.6 mm       in diameter. The outer endosonographic borders were well defined.      No lymphadenopathy seen.      Endosonographic imaging in the left lobe of the liver showed no       abnormalities.      The celiac region was visualized and showed no sign of significant       endosonographic abnormality. Impression:           EGD Impressions:                       - Normal esophagus.                       - A single submucosal nodule was found in the stomach.                       - Normal examined duodenum.                       EUS Impressions:                       - An intramural (subepithelial) lesion was found in the                        antrum of the stomach. The lesion appeared to originate                        from within the deep mucosa (Layer 2) and submucosa                        (Layer 3). Query pancreas rest versus lipoma vs layer 2                        smooth muscle tumor. FNA not performed given the small                        size and benign appearance of the lesion.                       -  No lymphadenopathy seen.                       - Normal visualized portions of the liver.                       - Normal celiac region.                       - No specimens collected. Recommendation:       - Discharge patient to home (ambulatory).                       - Repeat the upper endoscopic ultrasound in 6 months                        for surveillance.                       - The findings and recommendations were discussed with                        the patient and her family.                       - Return to referring physician as previously scheduled. Procedure Code(s):    --- Professional ---                        [phone], 73, Esophagogastroduodenoscopy, flexible,                        transoral; with endoscopic ultrasound examination                        limited to the esophagus, stomach or duodenum, and                        adjacent structures Diagnosis Code(s):    --- Professional ---                       K31.89, Other diseases of stomach and duodenum CPT copyright 2016 American Medical Association. All rights reserved. The codes documented in this report are preliminary and upon coder review may  be revised to meet current compliance requirements. Attending Participation:      I personally performed the entire procedure without the assistance of a       fellow, resident or surgical assistant. [place],  04/03/2017 12:16:01 PM This report has been signed electronically. Number of Addenda: 0 Note Initiated On: 04/03/2017 11:36 AM      [facility]

## 2017-04-06 NOTE — Anesthesia Postprocedure Evaluation (Signed)
Anesthesia Post Note  Patient: [name]  Procedure(s) Performed: UPPER ESOPHAGEAL ENDOSCOPIC ULTRASOUND (EUS) (N/A )  Patient location during evaluation: PACU Anesthesia Type: General Level of consciousness: awake and alert Pain management: pain level controlled Vital Signs Assessment: post-procedure vital signs reviewed and stable Respiratory status: spontaneous breathing, nonlabored ventilation, respiratory function stable and patient connected to nasal cannula oxygen Cardiovascular status: blood pressure returned to baseline and stable Postop Assessment: no apparent nausea or vomiting Anesthetic complications: no     Last Vitals:  Vitals:   04/03/17 1218 04/03/17 1238  BP: 96/74 105/73  Pulse:    Resp:    Temp:    SpO2:      Last Pain:  Vitals:   04/03/17 1208  TempSrc: Tympanic                 [name]

## 2017-04-07 ENCOUNTER — Encounter: Payer: Self-pay | Admitting: Internal Medicine

## 2017-04-07 NOTE — Progress Notes (Signed)
EUS report routed to Dr. Marva PandaSkulskie. Oncology Nurse Navigator Documentation  Navigator Location: [facility] (04/07/17 1300)   )Navigator Encounter Type: Letter/Fax/Email (04/07/17 1300)                                                    Time Spent with Patient: 15 (04/07/17 1300)

## 2017-05-29 ENCOUNTER — Encounter: Payer: Self-pay | Admitting: Student in an Organized Health Care Education/Training Program

## 2017-05-29 ENCOUNTER — Other Ambulatory Visit: Payer: Self-pay

## 2017-05-29 ENCOUNTER — Ambulatory Visit
Payer: Medicaid Other | Attending: Student in an Organized Health Care Education/Training Program | Admitting: Student in an Organized Health Care Education/Training Program

## 2017-05-29 VITALS — BP 139/92 | HR 99 | Temp 97.7°F | Resp 18 | Ht 59.0 in | Wt 127.0 lb

## 2017-05-29 DIAGNOSIS — I10 Essential (primary) hypertension: Secondary | ICD-10-CM | POA: Diagnosis not present

## 2017-05-29 DIAGNOSIS — M545 Low back pain: Secondary | ICD-10-CM | POA: Diagnosis present

## 2017-05-29 DIAGNOSIS — K219 Gastro-esophageal reflux disease without esophagitis: Secondary | ICD-10-CM | POA: Insufficient documentation

## 2017-05-29 DIAGNOSIS — M5442 Lumbago with sciatica, left side: Secondary | ICD-10-CM | POA: Diagnosis not present

## 2017-05-29 DIAGNOSIS — M797 Fibromyalgia: Secondary | ICD-10-CM | POA: Diagnosis not present

## 2017-05-29 DIAGNOSIS — G894 Chronic pain syndrome: Secondary | ICD-10-CM | POA: Insufficient documentation

## 2017-05-29 DIAGNOSIS — G8929 Other chronic pain: Secondary | ICD-10-CM | POA: Insufficient documentation

## 2017-05-29 DIAGNOSIS — Z7984 Long term (current) use of oral hypoglycemic drugs: Secondary | ICD-10-CM | POA: Insufficient documentation

## 2017-05-29 DIAGNOSIS — F1190 Opioid use, unspecified, uncomplicated: Secondary | ICD-10-CM | POA: Diagnosis not present

## 2017-05-29 DIAGNOSIS — Z7951 Long term (current) use of inhaled steroids: Secondary | ICD-10-CM | POA: Diagnosis not present

## 2017-05-29 DIAGNOSIS — M5441 Lumbago with sciatica, right side: Secondary | ICD-10-CM | POA: Diagnosis not present

## 2017-05-29 DIAGNOSIS — M5116 Intervertebral disc disorders with radiculopathy, lumbar region: Secondary | ICD-10-CM | POA: Insufficient documentation

## 2017-05-29 DIAGNOSIS — K760 Fatty (change of) liver, not elsewhere classified: Secondary | ICD-10-CM | POA: Diagnosis not present

## 2017-05-29 DIAGNOSIS — F329 Major depressive disorder, single episode, unspecified: Secondary | ICD-10-CM | POA: Diagnosis not present

## 2017-05-29 DIAGNOSIS — F17210 Nicotine dependence, cigarettes, uncomplicated: Secondary | ICD-10-CM | POA: Diagnosis not present

## 2017-05-29 DIAGNOSIS — J449 Chronic obstructive pulmonary disease, unspecified: Secondary | ICD-10-CM | POA: Diagnosis not present

## 2017-05-29 DIAGNOSIS — M48061 Spinal stenosis, lumbar region without neurogenic claudication: Secondary | ICD-10-CM | POA: Diagnosis not present

## 2017-05-29 DIAGNOSIS — Z79899 Other long term (current) drug therapy: Secondary | ICD-10-CM | POA: Diagnosis not present

## 2017-05-29 DIAGNOSIS — E119 Type 2 diabetes mellitus without complications: Secondary | ICD-10-CM | POA: Diagnosis not present

## 2017-05-29 DIAGNOSIS — Z79891 Long term (current) use of opiate analgesic: Secondary | ICD-10-CM | POA: Insufficient documentation

## 2017-05-29 DIAGNOSIS — M5416 Radiculopathy, lumbar region: Secondary | ICD-10-CM | POA: Diagnosis not present

## 2017-05-29 DIAGNOSIS — M7918 Myalgia, other site: Secondary | ICD-10-CM | POA: Diagnosis not present

## 2017-05-29 DIAGNOSIS — E785 Hyperlipidemia, unspecified: Secondary | ICD-10-CM | POA: Diagnosis not present

## 2017-05-29 DIAGNOSIS — Z7982 Long term (current) use of aspirin: Secondary | ICD-10-CM | POA: Diagnosis not present

## 2017-05-29 NOTE — Progress Notes (Signed)
Safety precautions to be maintained throughout the outpatient stay will include: orient to surroundings, keep bed in low position, maintain call bell within reach at all times, provide assistance with transfer out of bed and ambulation.  

## 2017-05-29 NOTE — Patient Instructions (Signed)
Epidural Steroid Injection An epidural steroid injection is given to relieve pain in your neck, back, or legs that is caused by the irritation or swelling of a nerve root. This procedure involves injecting a steroid and numbing medicine (anesthetic) into the epidural space. The epidural space is the space between the outer covering of your spinal cord and the bones that form your backbone (vertebra).  LET YOUR HEALTH CARE PROVIDER KNOW ABOUT:   Any allergies you have.  All medicines you are taking, including vitamins, herbs, eye drops, creams, and over-the-counter medicines such as aspirin.  Previous problems you or members of your family have had with the use of anesthetics.  Any blood disorders or blood clotting disorders you have.  Previous surgeries you have had.  Medical conditions you have.  RISKS AND COMPLICATIONS Generally, this is a safe procedure. However, as with any procedure, complications can occur. Possible complications of epidural steroid injection include:  Headache.  Bleeding.  Infection.  Allergic reaction to the medicines.  Damage to your nerves. The response to this procedure depends on the underlying cause of the pain and its duration. People who have long-term (chronic) pain are less likely to benefit from epidural steroids than are those people whose pain comes on strong and suddenly.  BEFORE THE PROCEDURE   Ask your health care provider about changing or stopping your regular medicines. You may be advised to stop taking blood-thinning medicines a few days before the procedure.  You may be given medicines to reduce anxiety.  Arrange for someone to take you home after the procedure.  PROCEDURE   You will remain awake during the procedure. You may receive medicine to make you relaxed.  You will be asked to lie on your stomach.  The injection site will be cleaned.  The injection site will be numbed with a medicine (local anesthetic).  A needle  will be injected through your skin into the epidural space.  Your health care provider will use an X-ray machine to ensure that the steroid is delivered closest to the affected nerve. You may have minimal discomfort at this time.  Once the needle is in the right position, the local anesthetic and the steroid will be injected into the epidural space.  The needle will then be removed and a bandage will be applied to the injection site.  AFTER THE PROCEDURE   You may be monitored for a short time before you go home.  You may feel weakness or numbness in your arm or leg, which disappears within hours.  You may be allowed to eat, drink, and take your regular medicine.  You may have soreness at the site of the injection.   This information is not intended to replace advice given to you by your health care provider. Make sure you discuss any questions you have with your health care provider.   Document Released: 06/18/2007 Document Revised: 11/11/2012 Document Reviewed: 08/28/2012 Elsevier Interactive Patient Education 2016 [employer]  GENERAL RISKS AND COMPLICATIONS  What are the risk, side effects and possible complications? Generally speaking, most procedures are safe.  However, with any procedure there are risks, side effects, and the possibility of complications.  The risks and complications are dependent upon the sites that are lesioned, or the type of nerve block to be performed.  The closer the procedure is to the spine, the more serious the risks are.  Great care is taken when placing the radio frequency needles, block needles or lesioning probes, but sometimes   complications can occur. Infection: Any time there is an injection through the skin, there is a risk of infection.  This is why sterile conditions are used for these blocks. There are four possible types of infection: 1. Localized skin infection. 2. Central Nervous System Infection: This can be in the form of Meningitis,  which can be deadly. 3. Epidural Infections: This can be in the form of an epidural abscess, which can cause pressure inside of the spine, causing compression of the spinal cord with subsequent paralysis. This would require an emergency surgery to decompress, and there are no guarantees that the patient would recover from the paralysis. 4. Discitis: This is an infection of the intervertebral discs. It occurs in about 1% of discography procedures. It is difficult to treat and it may lead to surgery. Pain: the needles have to go through skin and soft tissues, will cause soreness. Damage to internal structures:  The nerves to be lesioned may be near blood vessels or other nerves which can be potentially damaged. Bleeding: Bleeding is more common if the patient is taking blood thinners such as  aspirin, Coumadin, Ticiid, Plavix, etc., or if he/she have some genetic predisposition such as hemophilia. Bleeding into the spinal canal can cause compression of the spinal  cord with subsequent paralysis.  This would require an emergency surgery to decompress and there are no guarantees that the patient would recover from the paralysis. Pneumothorax: Puncturing of a lung is a possibility, every time a needle is introduced in the area of the chest or upper back.  Pneumothorax refers to free air around the collapsed lung(s), inside of the thoracic cavity (chest cavity).  Another two possible complications related to a similar event would include: Hemothorax and Chylothorax. These are variations of the Pneumothorax, where instead of air around the collapsed lung(s), you may have blood or chyle, respectively. Spinal headaches: They may occur with any procedures in the area of the spine. Persistent CSF (Cerebro-Spinal Fluid) leakage: This is a rare problem, but may occur with prolonged intrathecal or epidural catheters either due to the formation of a fistulous track or a dural tear. Nerve damage: By working so close to the  spinal cord, there is always a possibility of nerve damage, which could be as serious as a permanent spinal cord injury with paralysis. Death: Although rare, severe deadly allergic reactions known as "Anaphylactic reaction" can occur to any of the medications used. Worsening of the symptoms: We can always make thing worse.  What are the chances of something like this happening? Chances of any of this occuring are extremely low.  By statistics, you have more of a chance of getting killed in a motor vehicle accident: while driving to the hospital than any of the above occurring .  Nevertheless, you should be aware that they are possibilities.  In general, it is similar to taking a shower.  Everybody knows that you can slip, hit your head and get killed.  Does that mean that you should not shower again?  Nevertheless always keep in mind that statistics do not mean anything if you happen to be on the wrong side of them.  Even if a procedure has a 1 (one) in a 1,000,000 (million) chance of going wrong, it you happen to be that one..Also, keep in mind that by statistics, you have more of a chance of having something go wrong when taking medications.  Who should not have this procedure? If you are on a blood thinning medication (  e.g. Coumadin, Plavix, see list of "Blood Thinners"), or if you have an active infection going on, you should not have the procedure.  If you are taking any blood thinners, please inform your physician.  Preparing for your procedure: 1. Do not eat or drink anything at least eight (8) hours prior to the procedure. 2. Bring a driver with you .  It cannot be a taxi. 3. Come accompanied by an adult that can drive you back, and that is strong enough to help you if your legs get weak or numb from the local anesthetic. 4. Take all of your medicines the morning of the procedure with just enough water to swallow them. 5. If you have diabetes, make sure that you are scheduled to have your  procedure done first thing in the morning, whenever possible. 6. If you have diabetes, take only half of your insulin dose and notify our nurse that you have done so as soon as you arrive at the clinic. 7. If you are diabetic, but only take blood sugar pills (oral hypoglycemic), then do not take them on the morning of your procedure.  You may take them after you have had the procedure. 8. Do not take aspirin or any aspirin-containing medications, at least eleven (11) days prior to the procedure.  They may prolong bleeding. 9. Wear loose fitting clothing that may be easy to take off and that you would not mind if it got stained with Betadine or blood. 10. Do not wear any jewelry or perfume 11. Remove any nail coloring.  It will interfere with some of our monitoring equipment. 12. If you take Metformin for your diabetes, stop it 48 hours prior to the procedure.  NOTE: Remember that this is not meant to be interpreted as a complete list of all possible complications.  Unforeseen problems may occur.  BLOOD THINNERS The following drugs contain aspirin or other products, which can cause increased bleeding during surgery and should not be taken for 2 weeks prior to and 1 week after surgery.  If you should need take something for relief of minor pain, you may take acetaminophen which is found in Tylenol,m Datril, Anacin-3 and Panadol. It is not blood thinner. The products listed below are.  Do not take any of the products listed below in addition to any listed on your instruction sheet.  A.P.C or A.P.C with Codeine Codeine Phosphate Capsules #3 Ibuprofen Ridaura  ABC compound Congesprin Imuran rimadil  Advil Cope Indocin Robaxisal  Alka-Seltzer Effervescent Pain Reliever and Antacid Coricidin or Coricidin-D  Indomethacin Rufen  Alka-Seltzer plus Cold Medicine Cosprin Ketoprofen S-A-C Tablets  Anacin Analgesic Tablets or Capsules Coumadin Korlgesic Salflex  Anacin Extra Strength Analgesic tablets or  capsules CP-2 Tablets Lanoril Salicylate  Anaprox Cuprimine Capsules Levenox Salocol  Anexsia-D Dalteparin Magan Salsalate  Anodynos Darvon compound Magnesium Salicylate Sine-off  Ansaid Dasin Capsules Magsal Sodium Salicylate  Anturane Depen Capsules Marnal Soma  APF Arthritis pain formula Dewitt's Pills Measurin Stanback  Argesic Dia-Gesic Meclofenamic Sulfinpyrazone  Arthritis Bayer Timed Release Aspirin Diclofenac Meclomen Sulindac  Arthritis pain formula Anacin Dicumarol Medipren Supac  Analgesic (Safety coated) Arthralgen Diffunasal Mefanamic Suprofen  Arthritis Strength Bufferin Dihydrocodeine Mepro Compound Suprol  Arthropan liquid Dopirydamole Methcarbomol with Aspirin Synalgos  ASA tablets/Enseals Disalcid Micrainin Tagament  Ascriptin Doan's Midol Talwin  Ascriptin A/D Dolene Mobidin Tanderil  Ascriptin Extra Strength Dolobid Moblgesic Ticlid  Ascriptin with Codeine Doloprin or Doloprin with Codeine Momentum Tolectin  Asperbuf Duoprin Mono-gesic Trendar  Aspergum Duradyne   Motrin or Motrin IB Triminicin  Aspirin plain, buffered or enteric coated Durasal Myochrisine Trigesic  Aspirin Suppositories Easprin Nalfon Trillsate  Aspirin with Codeine Ecotrin Regular or Extra Strength Naprosyn Uracel  Atromid-S Efficin Naproxen Ursinus  Auranofin Capsules Elmiron Neocylate Vanquish  Axotal Emagrin Norgesic Verin  Azathioprine Empirin or Empirin with Codeine Normiflo Vitamin E  Azolid Emprazil Nuprin Voltaren  Bayer Aspirin plain, buffered or children's or timed BC Tablets or powders Encaprin Orgaran Warfarin Sodium  Buff-a-Comp Enoxaparin Orudis Zorpin  Buff-a-Comp with Codeine Equegesic Os-Cal-Gesic   Buffaprin Excedrin plain, buffered or Extra Strength Oxalid   Bufferin Arthritis Strength Feldene Oxphenbutazone   Bufferin plain or Extra Strength Feldene Capsules Oxycodone with Aspirin   Bufferin with Codeine Fenoprofen Fenoprofen Pabalate or Pabalate-SF   Buffets II Flogesic  Panagesic   Buffinol plain or Extra Strength Florinal or Florinal with Codeine Panwarfarin   Buf-Tabs Flurbiprofen Penicillamine   Butalbital Compound Four-way cold tablets Penicillin   Butazolidin Fragmin Pepto-Bismol   Carbenicillin Geminisyn Percodan   Carna Arthritis Reliever Geopen Persantine   Carprofen Gold's salt Persistin   Chloramphenicol Goody's Phenylbutazone   Chloromycetin Haltrain Piroxlcam   Clmetidine heparin Plaquenil   Cllnoril Hyco-pap Ponstel   Clofibrate Hydroxy chloroquine Propoxyphen         Before stopping any of these medications, be sure to consult the physician who ordered them.  Some, such as Coumadin (Warfarin) are ordered to prevent or treat serious conditions such as "deep thrombosis", "pumonary embolisms", and other heart problems.  The amount of time that you may need off of the medication may also vary with the medication and the reason for which you were taking it.  If you are taking any of these medications, please make sure you notify your pain physician before you undergo any procedures.Preparing for your procedure (without sedation) Instructions: . Oral Intake: Do not eat or drink anything for at least 3 hours prior to your procedure. . Transportation: Unless otherwise stated by your physician, you may drive yourself after the procedure. . Blood Pressure Medicine: Take your blood pressure medicine with a sip of water the morning of the procedure. . Insulin: Take only  of your normal insulin dose. . Preventing infections: Shower with an antibacterial soap the morning of your procedure. . Build-up your immune system: Take 1000 mg of Vitamin C with every meal (3 times a day) the day prior to your procedure. . Pregnancy: If you are pregnant, call and cancel the procedure. . Sickness: If you have a cold, fever, or any active infections, call and cancel the procedure. . Arrival: You must be in the facility at least 30 minutes prior to your scheduled  procedure. . Children: Do not bring any children with you. . Dress appropriately: Bring dark clothing that you would not mind if they get stained. . Valuables: Do not bring any jewelry or valuables. Procedure appointments are reserved for interventional treatments only. . No Prescription Refills. . No medication changes will be discussed during procedure appointments. No disability issues will be discussed. 

## 2017-05-29 NOTE — Progress Notes (Signed)
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 neuro-muscular anomalies detected.  Muscle Tone/Strength: Functionally intact. No obvious neuro-muscular anomalies detected.  Sensory (Neurological): Unimpaired  Sensory (Neurological): Unimpaired  Palpation: No palpable anomalies  Palpation: No palpable anomalies   Assessment  Primary Diagnosis & Pertinent Problem List: The  primary encounter diagnosis was Lumbar radiculopathy. Diagnoses of Chronic bilateral low back pain with bilateral sciatica, Chronic pain syndrome, Chronic, continuous use of opioids, and Chronic myofascial pain were also pertinent to this visit.  Visit Diagnosis (New problems to examiner): 1. Lumbar radiculopathy   2. Chronic bilateral low back pain with bilateral sciatica   3. Chronic pain syndrome   4. Chronic, continuous use of opioids   5. Chronic myofascial pain   General Recommendations: The pain condition that the patient suffers from is best treated with a  multidisciplinary approach that involves an increase in physical activity to prevent de-conditioning and worsening of the pain cycle, as well as psychological counseling (formal and/or informal) to address the co-morbid psychological affects of pain. Treatment will often involve judicious use of pain medications and interventional procedures to decrease the pain, allowing the patient to participate in the physical activity that will ultimately produce long-lasting pain reductions. The goal of the multidisciplinary approach is to return the patient to a higher level of overall function and to restore their ability to perform activities of daily living.  57 year old female who presents with a chief complaint of axial low back pain that radiates down her bilateral buttocks, lateral hips and then to her calves bilaterally.  This is secondary to lumbar radiculopathy, lumbar degenerative disc disease.  She  previously had lumbar epidural steroid injections, 2 in the past, greater than 2 years ago which provided significant benefit.  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  Patient was being prescribed hydrocodone 5 mg, quantity 30 by Hampton Abbot.  She is being referred here for pain management.  Patient is tried gabapentin and Lyrica in the past which were not effective.  She is also tried tramadol which was not effective.  Patient is also tried physical therapy in the past.  Current medications include Flexeril 10 mg 3 times daily, citalopram 40 mg, Topamax 25 mill grams twice daily as needed migraines.  We discussed repeat lumbar epidural steroid injection for her symptoms of lumbar radiculopathy.  She has received epidural steroid injections in the past which were helpful.  In consideration for chronic opioid therapy at this clinic, patient will complete urine drug screen and pain psychology evaluation for risk of substance abuse  disorder.  Pending results from both of these, we can consider taking the patient on for chronic opioid therapy however we will reduce her dose that she is taken hydrocodone 5 mg twice daily as needed for moderate to severe pain.  Plan: -Continue all chronic pain medications including Flexeril 10 mg 3 times daily as needed, Topamax 25 mg twice daily as needed as prescribed. -UDS today.  Expect this to be positive for hydrocodone and its metabolites. -Referral to pain psychology regarding risk of substance abuse disorder -Scheduled for lumbar epidural steroid injection with sedation under fluoroscopy for lumbar radiculopathy  Future: Lumbar facet medial branch nerve blocks, bilateral SI joint injection.  Note: Please be advised that as per protocol, today's visit has been an evaluation only. We have not taken over the patient's controlled substance management.  Ordered Lab-work, Procedure(s), Referral(s), & Consult(s): Orders Placed This Encounter  Procedures  . Lumbar Epidural Injection  . Compliance Drug Analysis, Ur  . Ambulatory referral to Psychology   Pharmacotherapy (current): Medications ordered:  No orders of the defined types were placed in this encounter.  Medications administered during this visit: Cele Mote. Son  had no medications administered during this visit.   Pharmacological management options:  Opioid Analgesics: The patient was informed that there is no guarantee that she would be a candidate for opioid analgesics. The decision will be made following CDC guidelines. This decision will be based on the results of diagnostic studies, as well as Ms. Filosa's risk profile. HC 5 mg BID-TID prn pending UDS and pain psych  Membrane stabilizer: To be determined at a later time  Muscle relaxant: To be determined at a later time  NSAID: To be determined at a later time  Other analgesic(s): To be determined at a later time   Interventional management options: Ms.  Gibby was informed that there is no guarantee that she would be a candidate for interventional therapies. The decision will be based on the results of diagnostic studies, as well as Ms. Geyer's risk profile.  Procedure(s) under consideration:  -Lumbar facet medial branch nerve blocks, bilateral SI joint injection.   Provider-requested follow-up: Return in about 2 weeks (around 06/12/2017) for Procedure.  Future Appointments  Date Time Provider [place]  06/16/2017 10:45 AM Gillis Santa, MD [facility] None    Primary Care Physician: Sharyne Peach, MD Location: [facility] Note by: Gillis Santa, M.D, Date: 05/29/2017; Time: 2:17 PM  Patient Instructions    Epidural Steroid Injection An epidural steroid injection is given to relieve pain in your neck, back, or legs that is caused by the irritation or swelling of a nerve root. This procedure involves injecting a steroid and numbing medicine (anesthetic) into the epidural space. The epidural space is the space between the outer covering of your spinal cord and the bones that form your backbone (vertebra).  LET [facility] CARE PROVIDER KNOW ABOUT:   Any allergies you have.  All medicines you are taking, including vitamins, herbs, eye drops, creams, and over-the-counter medicines such as aspirin.  Previous problems you or members of your family have had with the use of anesthetics.  Any blood disorders or blood clotting disorders you have.  Previous surgeries you have had.  Medical conditions you have.  RISKS AND COMPLICATIONS Generally, this is a safe procedure. However, as with any procedure, complications can occur. Possible complications of epidural steroid injection include:  Headache.  Bleeding.  Infection.  Allergic reaction to the medicines.  Damage to your nerves. The response to this procedure depends on the underlying cause of the pain and its duration. People who  have long-term (chronic) pain are less likely to benefit from epidural steroids than are those people whose pain comes on strong and suddenly.  BEFORE THE PROCEDURE   Ask your health care provider about changing or stopping your regular medicines. You may be advised to stop taking blood-thinning medicines a few days before the procedure.  You may be given medicines to reduce anxiety.  Arrange for someone to take you home after the procedure.  PROCEDURE   You will remain awake during the procedure. You may receive medicine to make you relaxed.  You will be asked to lie on your stomach.  The injection site will be cleaned.  The injection site will be numbed with a medicine (local anesthetic).  A needle will be injected through your skin into the epidural space.  Your health care provider will use an X-ray machine to ensure that the steroid is delivered closest to the affected nerve. You may have minimal discomfort at this time.  Once the needle is in the right position,  the local anesthetic and the steroid will be injected into the epidural space.  The needle will then be removed and a bandage will be applied to the injection site.  AFTER THE PROCEDURE   You may be monitored for a short time before you go home.  You may feel weakness or numbness in your arm or leg, which disappears within hours.  You may be allowed to eat, drink, and take your regular medicine.  You may have soreness at the site of the injection.   This information is not intended to replace advice given to you by your health care provider. Make sure you discuss any questions you have with your health care provider.   Document Released: 06/18/2007 Document Revised: 11/11/2012 Document Reviewed: 08/28/2012 Elsevier Interactive Patient Education 2016 [place]  What are the risk, side effects and possible complications? Generally speaking, most procedures are safe.   However, with any procedure there are risks, side effects, and the possibility of complications.  The risks and complications are dependent upon the sites that are lesioned, or the type of nerve block to be performed.  The closer the procedure is to the spine, the more serious the risks are.  Great care is taken when placing the radio frequency needles, block needles or lesioning probes, but sometimes complications can occur. Infection: Any time there is an injection through the skin, there is a risk of infection.  This is why sterile conditions are used for these blocks. There are four possible types of infection: 1. Localized skin infection. 2. Central Nervous System Infection: This can be in the form of Meningitis, which can be deadly. 3. Epidural Infections: This can be in the form of an epidural abscess, which can cause pressure inside of the spine, causing compression of the spinal cord with subsequent paralysis. This would require an emergency surgery to decompress, and there are no guarantees that the patient would recover from the paralysis. 4. Discitis: This is an infection of the intervertebral discs. It occurs in about 1% of discography procedures. It is difficult to treat and it may lead to surgery. Pain: the needles have to go through skin and soft tissues, will cause soreness. Damage to internal structures:  The nerves to be lesioned may be near blood vessels or other nerves which can be potentially damaged. Bleeding: Bleeding is more common if the patient is taking blood thinners such as  aspirin, Coumadin, Ticiid, Plavix, etc., or if he/she have some genetic predisposition such as hemophilia. Bleeding into the spinal canal can cause compression of the spinal  cord with subsequent paralysis.  This would require an emergency surgery to decompress and there are no guarantees that the patient would recover from the paralysis. Pneumothorax: Puncturing of a lung is a possibility, every time a  needle is introduced in the area of the chest or upper back.  Pneumothorax refers to free air around the collapsed lung(s), inside of the thoracic cavity (chest cavity).  Another two possible complications related to a similar event would include: Hemothorax and Chylothorax. These are variations of the Pneumothorax, where instead of air around the collapsed lung(s), you may have blood or chyle, respectively. Spinal headaches: They may occur with any procedures in the area of the spine. Persistent CSF (Cerebro-Spinal Fluid) leakage: This is a rare problem, but may occur with prolonged intrathecal or epidural catheters either due to the formation of a fistulous track or a dural tear. Nerve damage: By working so  close to the spinal cord, there is always a possibility of nerve damage, which could be as serious as a permanent spinal cord injury with paralysis. Death: Although rare, severe deadly allergic reactions known as "Anaphylactic reaction" can occur to any of the medications used. Worsening of the symptoms: We can always make thing worse.  What are the chances of something like this happening? Chances of any of this occuring are extremely low.  By statistics, you have more of a chance of getting killed in a motor vehicle accident: while driving to the hospital than any of the above occurring .  Nevertheless, you should be aware that they are possibilities.  In general, it is similar to taking a shower.  Everybody knows that you can slip, hit your head and get killed.  Does that mean that you should not shower again?  Nevertheless always keep in mind that statistics do not mean anything if you happen to be on the wrong side of them.  Even if a procedure has a 1 (one) in a 1,000,000 (million) chance of going wrong, it you happen to be that one..Also, keep in mind that by statistics, you have more of a chance of having something go wrong when taking medications.  Who should not have this procedure? If you  are on a blood thinning medication (e.g. Coumadin, Plavix, see list of "Blood Thinners"), or if you have an active infection going on, you should not have the procedure.  If you are taking any blood thinners, please inform your physician.  Preparing for your procedure: 1. Do not eat or drink anything at least eight (8) hours prior to the procedure. 2. Bring a driver with you .  It cannot be a taxi. 3. Come accompanied by an adult that can drive you back, and that is strong enough to help you if your legs get weak or numb from the local anesthetic. 4. Take all of your medicines the morning of the procedure with just enough water to swallow them. 5. If you have diabetes, make sure that you are scheduled to have your procedure done first thing in the morning, whenever possible. 6. If you have diabetes, take only half of your insulin dose and notify our nurse that you have done so as soon as you arrive at the clinic. 7. If you are diabetic, but only take blood sugar pills (oral hypoglycemic), then do not take them on the morning of your procedure.  You may take them after you have had the procedure. 8. Do not take aspirin or any aspirin-containing medications, at least eleven (11) days prior to the procedure.  They may prolong bleeding. 9. Wear loose fitting clothing that may be easy to take off and that you would not mind if it got stained with Betadine or blood. 10. Do not wear any jewelry or perfume 11. Remove any nail coloring.  It will interfere with some of our monitoring equipment. 12. If you take Metformin for your diabetes, stop it 48 hours prior to the procedure.  NOTE: Remember that this is not meant to be interpreted as a complete list of all possible complications.  Unforeseen problems may occur.  BLOOD THINNERS The following drugs contain aspirin or other products, which can cause increased bleeding during surgery and should not be taken for 2 weeks prior to and 1 week after surgery.  If  you should need take something for relief of minor pain, you may take acetaminophen which is found in Tylenol,m  Datril, Anacin-3 and Panadol. It is not blood thinner. The products listed below are.  Do not take any of the products listed below in addition to any listed on your instruction sheet.  A.P.C or A.P.C with Codeine Codeine Phosphate Capsules #3 Ibuprofen Ridaura  ABC compound Congesprin Imuran rimadil  Advil Cope Indocin Robaxisal  Alka-Seltzer Effervescent Pain Reliever and Antacid Coricidin or Coricidin-D  Indomethacin Rufen  Alka-Seltzer plus Cold Medicine Cosprin Ketoprofen S-A-C Tablets  Anacin Analgesic Tablets or Capsules Coumadin Korlgesic Salflex  Anacin Extra Strength Analgesic tablets or capsules CP-2 Tablets Lanoril Salicylate  Anaprox Cuprimine Capsules Levenox Salocol  Anexsia-D Dalteparin Magan Salsalate  Anodynos Darvon compound Magnesium Salicylate Sine-off  Ansaid Dasin Capsules Magsal Sodium Salicylate  Anturane Depen Capsules Marnal Soma  APF Arthritis pain formula Dewitt's Pills Measurin Stanback  Argesic Dia-Gesic Meclofenamic Sulfinpyrazone  Arthritis Bayer Timed Release Aspirin Diclofenac Meclomen Sulindac  Arthritis pain formula Anacin Dicumarol Medipren Supac  Analgesic (Safety coated) Arthralgen Diffunasal Mefanamic Suprofen  Arthritis Strength Bufferin Dihydrocodeine Mepro Compound Suprol  Arthropan liquid Dopirydamole Methcarbomol with Aspirin Synalgos  ASA tablets/Enseals Disalcid Micrainin Tagament  Ascriptin Doan's Midol Talwin  Ascriptin A/D Dolene Mobidin Tanderil  Ascriptin Extra Strength Dolobid Moblgesic Ticlid  Ascriptin with Codeine Doloprin or Doloprin with Codeine Momentum Tolectin  Asperbuf Duoprin Mono-gesic Trendar  Aspergum Duradyne Motrin or Motrin IB Triminicin  Aspirin plain, buffered or enteric coated Durasal Myochrisine Trigesic  Aspirin Suppositories Easprin Nalfon Trillsate  Aspirin with Codeine Ecotrin Regular or Extra  Strength Naprosyn Uracel  Atromid-S Efficin Naproxen Ursinus  Auranofin Capsules Elmiron Neocylate Vanquish  Axotal Emagrin Norgesic Verin  Azathioprine Empirin or Empirin with Codeine Normiflo Vitamin E  Azolid Emprazil Nuprin Voltaren  Bayer Aspirin plain, buffered or children's or timed BC Tablets or powders Encaprin Orgaran Warfarin Sodium  Buff-a-Comp Enoxaparin Orudis Zorpin  Buff-a-Comp with Codeine Equegesic Os-Cal-Gesic   Buffaprin Excedrin plain, buffered or Extra Strength Oxalid   Bufferin Arthritis Strength Feldene Oxphenbutazone   Bufferin plain or Extra Strength Feldene Capsules Oxycodone with Aspirin   Bufferin with Codeine Fenoprofen Fenoprofen Pabalate or Pabalate-SF   Buffets II Flogesic Panagesic   Buffinol plain or Extra Strength Florinal or Florinal with Codeine Panwarfarin   Buf-Tabs Flurbiprofen Penicillamine   Butalbital Compound Four-way cold tablets Penicillin   Butazolidin Fragmin Pepto-Bismol   Carbenicillin Geminisyn Percodan   Carna Arthritis Reliever Geopen Persantine   Carprofen Gold's salt Persistin   Chloramphenicol Goody's Phenylbutazone   Chloromycetin Haltrain Piroxlcam   Clmetidine heparin Plaquenil   Cllnoril Hyco-pap Ponstel   Clofibrate Hydroxy chloroquine Propoxyphen         Before stopping any of these medications, be sure to consult the physician who ordered them.  Some, such as Coumadin (Warfarin) are ordered to prevent or treat serious conditions such as "deep thrombosis", "pumonary embolisms", and other heart problems.  The amount of time that you may need off of the medication may also vary with the medication and the reason for which you were taking it.  If you are taking any of these medications, please make sure you notify your pain physician before you undergo any procedures.Preparing for your procedure (without sedation) Instructions: . Oral Intake: Do not eat or drink anything for at least 3 hours prior to your  procedure. . Transportation: Unless otherwise stated by your physician, you may drive yourself after the procedure. . Blood Pressure Medicine: Take your blood pressure medicine with a sip of water the morning of the procedure. . Insulin: Take only  of your normal insulin dose. . Preventing infections: Shower with an antibacterial soap the morning of your procedure. . Build-up your immune system: Take 1000 mg of Vitamin C with every meal (3 times a day) the day prior to your procedure. . Pregnancy: If you are pregnant, call and cancel the procedure. . Sickness: If you have a cold, fever, or any active infections, call and cancel the procedure. . Arrival: You must be in the facility at least 30 minutes prior to your scheduled procedure. . Children: Do not bring any children with you. . Dress appropriately: Bring dark clothing that you would not mind if they get stained. . Valuables: Do not bring any jewelry or valuables. Procedure appointments are reserved for interventional treatments only. Marland Kitchen No Prescription Refills. . No medication changes will be discussed during procedure appointments. No disability issues will be discussed.

## 2017-06-03 LAB — COMPLIANCE DRUG ANALYSIS, UR

## 2017-06-10 ENCOUNTER — Other Ambulatory Visit: Payer: Self-pay | Admitting: Gastroenterology

## 2017-06-10 DIAGNOSIS — R1031 Right lower quadrant pain: Secondary | ICD-10-CM

## 2017-06-10 DIAGNOSIS — K5792 Diverticulitis of intestine, part unspecified, without perforation or abscess without bleeding: Secondary | ICD-10-CM

## 2017-06-10 DIAGNOSIS — R1032 Left lower quadrant pain: Secondary | ICD-10-CM

## 2017-06-16 ENCOUNTER — Ambulatory Visit
Admission: RE | Admit: 2017-06-16 | Discharge: 2017-06-16 | Disposition: A | Discharge status: Home / Self Care | Payer: Medicaid Other | Source: Ambulatory Visit | Attending: Student in an Organized Health Care Education/Training Program | Admitting: Student in an Organized Health Care Education/Training Program

## 2017-06-16 ENCOUNTER — Other Ambulatory Visit: Payer: Self-pay

## 2017-06-16 ENCOUNTER — Ambulatory Visit: Payer: Medicaid Other | Admitting: Student in an Organized Health Care Education/Training Program

## 2017-06-16 ENCOUNTER — Encounter: Payer: Self-pay | Admitting: Student in an Organized Health Care Education/Training Program

## 2017-06-16 VITALS — BP 128/92 | HR 96 | Temp 97.7°F | Resp 12 | Ht 59.0 in | Wt 120.0 lb

## 2017-06-16 DIAGNOSIS — G894 Chronic pain syndrome: Secondary | ICD-10-CM

## 2017-06-16 DIAGNOSIS — M5416 Radiculopathy, lumbar region: Secondary | ICD-10-CM | POA: Insufficient documentation

## 2017-06-16 DIAGNOSIS — F1190 Opioid use, unspecified, uncomplicated: Secondary | ICD-10-CM

## 2017-06-16 MED ORDER — HYDROCODONE-ACETAMINOPHEN 5-325 MG PO TABS: 1.0000 | ORAL_TABLET | Freq: Two times a day (BID) | ORAL | 0 refills | Status: DC | PRN

## 2017-06-16 MED ORDER — DEXAMETHASONE SODIUM PHOSPHATE 10 MG/ML IJ SOLN
10.0000 mg | Freq: Once | INTRAMUSCULAR | Status: AC
  Administered 2017-06-16: 10 mg
  Filled 2017-06-16: qty 1

## 2017-06-16 MED ORDER — SODIUM CHLORIDE 0.9 % IJ SOLN
INTRAMUSCULAR | Status: AC
  Filled 2017-06-16: qty 10

## 2017-06-16 MED ORDER — LIDOCAINE HCL (PF) 1 % IJ SOLN
4.5000 mL | Freq: Once | INTRAMUSCULAR | Status: AC
  Administered 2017-06-16: 5 mL
  Filled 2017-06-16: qty 5

## 2017-06-16 MED ORDER — ROPIVACAINE HCL 2 MG/ML IJ SOLN
2.0000 mL | Freq: Once | INTRAMUSCULAR | Status: AC
  Administered 2017-06-16: 2 mL via EPIDURAL
  Filled 2017-06-16: qty 10

## 2017-06-16 MED ORDER — IOPAMIDOL (ISOVUE-M 200) INJECTION 41%
10.0000 mL | Freq: Once | INTRAMUSCULAR | Status: AC
  Administered 2017-06-16: 10 mL via EPIDURAL
  Filled 2017-06-16: qty 10

## 2017-06-16 MED ORDER — LACTATED RINGERS IV SOLN: 1000.0000 mL | Freq: Once | INTRAVENOUS | Status: DC

## 2017-06-16 MED ORDER — FENTANYL CITRATE (PF) 100 MCG/2ML IJ SOLN: 25.0000 ug | INTRAMUSCULAR | Status: DC | PRN

## 2017-06-16 MED ORDER — SODIUM CHLORIDE 0.9% FLUSH
2.0000 mL | Freq: Once | INTRAVENOUS | Status: AC
  Administered 2017-06-16: 10 mL

## 2017-06-16 NOTE — Progress Notes (Signed)
Nursing Pain Medication Assessment:  Safety precautions to be maintained throughout the outpatient stay will include: orient to surroundings, keep bed in low position, maintain call bell within reach at all times, provide assistance with transfer out of bed and ambulation.  Medication Inspection Compliance: Pill count conducted under aseptic conditions, in front of the patient. Neither the pills nor the bottle was removed from the patient's sight at any time. Once count was completed pills were immediately returned to the patient in their original bottle.  Medication: Hydrocodone/APAP Pill/Patch Count: 14 of 30 pills remain Pill/Patch Appearance: Markings consistent with prescribed medication Bottle Appearance: Standard pharmacy container. Clearly labeled. Filled Date: 3 /00 / 2019 Last Medication intake:  Yesterday

## 2017-06-16 NOTE — Progress Notes (Signed)
Patient's Name: [name]  MRN: [id number]  Referring Provider: Rayetta Humphrey, MD  DOB: Aug 05, 1960  PCP: Rayetta Humphrey, MD  DOS: 06/16/2017  Note by: Edward Jolly, MD  Service setting: Ambulatory outpatient  Specialty: Interventional Pain Management  Patient type: Established  Location: [facility] (AMB) Pain Management Facility  Visit type: Interventional Procedure   Primary Reason for Visit: Interventional Pain Management Treatment. CC: Back Pain (lower)  Procedure:       Anesthesia, Analgesia, Anxiolysis:  Type: Therapeutic Inter-Laminar Epidural Steroid Injection          Region: Lumbar Level: L4-5 Level. Laterality: Left-Sided         Type: Local Anesthesia Indication(s): Analgesia         Route: Infiltration ([place]/IM) IV Access: Declined Sedation: Declined  Local Anesthetic: Lidocaine 1%   Indications: 1. Lumbar radiculopathy   2. Chronic pain syndrome   3. Chronic, continuous use of opioids    Pain Score: Pre-procedure: 8 /10 Post-procedure: 3/10  Pre-op Assessment:  [name] is a 57 y.o. (year old), female patient, seen today for interventional treatment. She  has a past surgical history that includes Abdominal surgery; Abdominal hysterectomy; Esophagogastroduodenoscopy (egd) with propofol (N/A, 02/20/2017); Colonoscopy with propofol (N/A, 02/20/2017); Upper esophageal endoscopic ultrasound (eus) (N/A, 04/03/2017); and Diverticulitis. [name] has a current medication list which includes the following prescription(s): albuterol, aspirin, cyclobenzaprine, fluticasone, hydrocodone-acetaminophen, lisinopril, meclizine, meloxicam, metformin, mirtazapine, nicotine, ondansetron, pantoprazole, polyethylene glycol, ranitidine, rizatriptan, rosuvastatin, simethicone, sucralfate, topiramate, citalopram, gabapentin, and trazodone. Her primarily concern today is the Back Pain (lower)  Initial Vital Signs:  Pulse Rate: (!) 105 Temp: 97.7 F (36.5 C) Resp: 18 BP: (!)  134/91 SpO2: 98 %  BMI: Estimated body mass index is 24.24 kg/m as calculated from the following:   Height as of this encounter: 4\' 11"  (1.499 m).   Weight as of this encounter: 120 lb (54.4 kg).  Risk Assessment: Allergies: Reviewed. She is allergic to tramadol; benadryl [diphenhydramine hcl]; bextra [valdecoxib]; invokana [canagliflozin]; and sulfamethoxazole-trimethoprim.  Allergy Precautions: None required Coagulopathies: Reviewed. None identified.  Blood-thinner therapy: None at this time Active Infection(s): Reviewed. None identified. [name] is afebrile  Site Confirmation: [name] was asked to confirm the procedure and laterality before marking the site Procedure checklist: Completed Consent: Before the procedure and under the influence of no sedative(s), amnesic(s), or anxiolytics, the patient was informed of the treatment options, risks and possible complications. To fulfill our ethical and legal obligations, as recommended by the American Medical Association's Code of Ethics, I have informed the patient of my clinical impression; the nature and purpose of the treatment or procedure; the risks, benefits, and possible complications of the intervention; the alternatives, including doing nothing; the risk(s) and benefit(s) of the alternative treatment(s) or procedure(s); and the risk(s) and benefit(s) of doing nothing. The patient was provided information about the general risks and possible complications associated with the procedure. These may include, but are not limited to: failure to achieve desired goals, infection, bleeding, organ or nerve damage, allergic reactions, paralysis, and death. In addition, the patient was informed of those risks and complications associated to Spine-related procedures, such as failure to decrease pain; infection (i.e.: Meningitis, epidural or intraspinal abscess); bleeding (i.e.: epidural hematoma, subarachnoid hemorrhage, or any other type  of intraspinal or peri-dural bleeding); organ or nerve damage (i.e.: Any type of peripheral nerve, nerve root, or spinal cord injury) with subsequent damage to sensory, motor, and/or autonomic systems, resulting in permanent pain, numbness, and/or  weakness of one or several areas of the body; allergic reactions; (i.e.: anaphylactic reaction); and/or death. Furthermore, the patient was informed of those risks and complications associated with the medications. These include, but are not limited to: allergic reactions (i.e.: anaphylactic or anaphylactoid reaction(s)); adrenal axis suppression; blood sugar elevation that in diabetics may result in ketoacidosis or comma; water retention that in patients with history of congestive heart failure may result in shortness of breath, pulmonary edema, and decompensation with resultant heart failure; weight gain; swelling or edema; medication-induced neural toxicity; particulate matter embolism and blood vessel occlusion with resultant organ, and/or nervous system infarction; and/or aseptic necrosis of one or more joints. Finally, the patient was informed that Medicine is not an exact science; therefore, there is also the possibility of unforeseen or unpredictable risks and/or possible complications that may result in a catastrophic outcome. The patient indicated having understood very clearly. We have given the patient no guarantees and we have made no promises. Enough time was given to the patient to ask questions, all of which were answered to the patient's satisfaction. [name] has indicated that she wanted to continue with the procedure. Attestation: I, the ordering provider, attest that I have discussed with the patient the benefits, risks, side-effects, alternatives, likelihood of achieving goals, and potential problems during recovery for the procedure that I have provided informed consent. Date  Time: 06/16/2017 10:43 AM  Pre-Procedure Preparation:   Monitoring: As per clinic protocol. Respiration, ETCO2, SpO2, BP, heart rate and rhythm monitor placed and checked for adequate function Safety Precautions: Patient was assessed for positional comfort and pressure points before starting the procedure. Time-out: I initiated and conducted the "Time-out" before starting the procedure, as per protocol. The patient was asked to participate by confirming the accuracy of the "Time Out" information. Verification of the correct person, site, and procedure were performed and confirmed by me, the nursing staff, and the patient. "Time-out" conducted as per Joint Commission's Universal Protocol (UP.01.01.01). Time: 1108  Description of Procedure:       Position: Prone with head of the table was raised to facilitate breathing. Target Area: The interlaminar space, initially targeting the lower laminar border of the superior vertebral body. Approach: Paramedial approach. Area Prepped: Entire Posterior Lumbar Region Prepping solution: ChloraPrep (2% chlorhexidine gluconate and 70% isopropyl alcohol) Safety Precautions: Aspiration looking for blood return was conducted prior to all injections. At no point did we inject any substances, as a needle was being advanced. No attempts were made at seeking any paresthesias. Safe injection practices and needle disposal techniques used. Medications properly checked for expiration dates. SDV (single dose vial) medications used. Description of the Procedure: Protocol guidelines were followed. The procedure needle was introduced through the skin, ipsilateral to the reported pain, and advanced to the target area. Bone was contacted and the needle walked caudad, until the lamina was cleared. The epidural space was identified using "loss-of-resistance technique" with 2-3 ml of PF-NaCl (0.9% NSS), in a 5cc LOR glass syringe. Vitals:   06/16/17 1046 06/16/17 1105 06/16/17 1113 06/16/17 1118  BP: (!) 134/91 (!) 124/101 (!) 124/91 (!)  128/92  Pulse: (!) 105 100 97 96  Resp: 18 12 13 12   Temp: 97.7 F (36.5 C)     TempSrc: Oral     SpO2: 98% 97% 99% 98%  Weight: 120 lb (54.4 kg)     Height: 4\' 11"  (1.499 m)       Start Time: 1108 hrs. End Time: 1115 hrs. Materials:  Needle(s) Type: Epidural needle Gauge: 17G Length: 3.5-in Medication(s): Please see orders for medications and dosing details. 8 CC solution made of 5 cc of preservative-free saline, 2 cc of 0.2% ropivacaine, 1 cc of Decadron 10 mg/cc. Imaging Guidance (Spinal):  Type of Imaging Technique: Fluoroscopy Guidance (Spinal) Indication(s): Assistance in needle guidance and placement for procedures requiring needle placement in or near specific anatomical locations not easily accessible without such assistance. Exposure Time: Please see nurses notes. Contrast: Before injecting any contrast, we confirmed that the patient did not have an allergy to iodine, shellfish, or radiological contrast. Once satisfactory needle placement was completed at the desired level, radiological contrast was injected. Contrast injected under live fluoroscopy. No contrast complications. See chart for type and volume of contrast used. Fluoroscopic Guidance: I was personally present during the use of fluoroscopy. "Tunnel Vision Technique" used to obtain the best possible view of the target area. Parallax error corrected before commencing the procedure. "Direction-depth-direction" technique used to introduce the needle under continuous pulsed fluoroscopy. Once target was reached, antero-posterior, oblique, and lateral fluoroscopic projection used confirm needle placement in all planes. Images permanently stored in EMR. Interpretation: I personally interpreted the imaging intraoperatively. Adequate needle placement confirmed in multiple planes. Appropriate spread of contrast into desired area was observed. No evidence of afferent or efferent intravascular uptake. No intrathecal or subarachnoid  spread observed. Permanent images saved into the patient's record.  Antibiotic Prophylaxis:   Anti-infectives (From admission, onward)   None     Indication(s): None identified  Post-operative Assessment:  Post-procedure Vital Signs:  Pulse Rate: 96 Temp: 97.7 F (36.5 C) Resp: 12 BP: (!) 128/92 SpO2: 98 %  EBL: None  Complications: No immediate post-treatment complications observed by team, or reported by patient.  Note: The patient tolerated the entire procedure well. A repeat set of vitals were taken after the procedure and the patient was kept under observation following institutional policy, for this type of procedure. Post-procedural neurological assessment was performed, showing return to baseline, prior to discharge. The patient was provided with post-procedure discharge instructions, including a section on how to identify potential problems. Should any problems arise concerning this procedure, the patient was given instructions to immediately contact us, at any time, without hesitation. In any case, we plan to contact the patient by telephone for a follow-up status report regarding this interventional procedure.  Comments:  No additional relevant information. 5 out of 5 strength bilateral lower extremity: Plantar flexion, dorsiflexion, knee flexion, knee extension.  Plan of Care  Patient's urine drug screen was appropriate.  She has also seen [facility] pain psychology and was deemed low risk for substance abuse disorder. PMP checked and appropriate.  We will have her complete opioid contract with our clinic and prescribe hydrocodone 5 mg twice daily as needed for moderate to severe pain.  Prescription provided for 1 month. Fill date 06/23/17  Patient will follow-up in 1 month for postprocedural evaluation after lumbar epidural steroid injection and medication refill for hydrocodone.  Medications ordered for procedure: Meds ordered this encounter  Medications  . DISCONTD:  lactated ringers infusion 1,000 mL  . DISCONTD: fentaNYL (SUBLIMAZE) injection 25-100 mcg    Make sure Narcan is available in the pyxis when using this medication. In the event of respiratory depression (RR< 8/min): Titrate NARCAN (naloxone) in increments of 0.1 to 0.2 mg IV at 2-3 minute intervals, until desired degree of reversal.  . iopamidol (ISOVUE-M) 41 % intrathecal injection 10 mL  . ropivacaine (PF) 2 mg/mL (0.2%) (  NAROPIN) injection 2 mL  . sodium chloride flush (NS) 0.9 % injection 2 mL  . lidocaine (PF) (XYLOCAINE) 1 % injection 4.5 mL  . dexamethasone (DECADRON) injection 10 mg  . HYDROcodone-acetaminophen (NORCO/VICODIN) 5-325 MG tablet    Sig: Take 1 tablet by mouth 2 (two) times daily as needed. For chronic pain To last for 30 days from fill date    Dispense:  60 tablet    Refill:  0   Medications administered: We administered iopamidol, ropivacaine (PF) 2 mg/mL (0.2%), sodium chloride flush, lidocaine (PF), and dexamethasone.  See the medical record for exact dosing, route, and time of administration.  New Prescriptions   No medications on file   Disposition: Discharge home  Discharge Date & Time: 06/16/2017; 1121 hrs.   Physician-requested Follow-up: Return in about 1 month (around 07/14/2017) for Medication Management, Post Procedure Evaluation.  Future Appointments  Date Time Provider Department Center  06/26/2017 10:00 AM [facility]  07/14/2017  2:15 PM Edward Jolly, MD [facility] None   Primary Care Physician: Rayetta Humphrey, MD Location: [facility] Note by: Edward Jolly, MD Date: 06/16/2017; Time: 11:46 AM  Disclaimer:  Medicine is not an exact science. The only guarantee in medicine is that nothing is guaranteed. It is important to note that the decision to proceed with this intervention was based on the information collected from the patient. The Data and conclusions were drawn from the patient's questionnaire, the  interview, and the physical examination. Because the information was provided in large part by the patient, it cannot be guaranteed that it has not been purposely or unconsciously manipulated. Every effort has been made to obtain as much relevant data as possible for this evaluation. It is important to note that the conclusions that lead to this procedure are derived in large part from the available data. Always take into account that the treatment will also be dependent on availability of resources and existing treatment guidelines, considered by other Pain Management Practitioners as being common knowledge and practice, at the time of the intervention. For Medico-Legal purposes, it is also important to point out that variation in procedural techniques and pharmacological choices are the acceptable norm. The indications, contraindications, technique, and results of the above procedure should only be interpreted and judged by a Board-Certified Interventional Pain Specialist with extensive familiarity and expertise in the same exact procedure and technique.

## 2017-06-16 NOTE — Progress Notes (Signed)
Safety precautions to be maintained throughout the outpatient stay will include: orient to surroundings, keep bed in low position, maintain call bell within reach at all times, provide assistance with transfer out of bed and ambulation.  

## 2017-06-17 ENCOUNTER — Telehealth: Payer: Self-pay | Admitting: *Deleted

## 2017-06-17 NOTE — Telephone Encounter (Signed)
LVM for patient to call for any post procedure issues. 

## 2017-06-17 NOTE — Telephone Encounter (Signed)
Patient advised that if this is in reference to having recently had a procedure, it is ok to take sleeping meds.

## 2017-06-24 ENCOUNTER — Telehealth: Payer: Self-pay | Admitting: Student in an Organized Health Care Education/Training Program

## 2017-06-24 NOTE — Telephone Encounter (Signed)
Spoke with Dr. Cherylann RatelLateef. If rash still there tomorrow, have patient come for eval. Patient instructed to call in a.m to let us know if the redness is still there.

## 2017-06-24 NOTE — Telephone Encounter (Signed)
Having hives and swelling in area of injection, also right leg is aching since about 2 days ago [phone]

## 2017-06-24 NOTE — Telephone Encounter (Signed)
Reddened area at lower back, around injection site, down to the buttocks. Began 4 days after the LESI.

## 2017-06-25 ENCOUNTER — Other Ambulatory Visit: Payer: Self-pay

## 2017-06-25 ENCOUNTER — Ambulatory Visit
Payer: Medicaid Other | Attending: Student in an Organized Health Care Education/Training Program | Admitting: Student in an Organized Health Care Education/Training Program

## 2017-06-25 ENCOUNTER — Encounter: Payer: Self-pay | Admitting: Student in an Organized Health Care Education/Training Program

## 2017-06-25 VITALS — BP 137/82 | HR 110 | Temp 98.5°F | Ht 59.0 in | Wt 120.0 lb

## 2017-06-25 DIAGNOSIS — Z7984 Long term (current) use of oral hypoglycemic drugs: Secondary | ICD-10-CM | POA: Insufficient documentation

## 2017-06-25 DIAGNOSIS — Z791 Long term (current) use of non-steroidal anti-inflammatories (NSAID): Secondary | ICD-10-CM | POA: Diagnosis not present

## 2017-06-25 DIAGNOSIS — I10 Essential (primary) hypertension: Secondary | ICD-10-CM | POA: Diagnosis not present

## 2017-06-25 DIAGNOSIS — Z7982 Long term (current) use of aspirin: Secondary | ICD-10-CM | POA: Insufficient documentation

## 2017-06-25 DIAGNOSIS — M797 Fibromyalgia: Secondary | ICD-10-CM | POA: Diagnosis not present

## 2017-06-25 DIAGNOSIS — M5416 Radiculopathy, lumbar region: Secondary | ICD-10-CM | POA: Diagnosis not present

## 2017-06-25 DIAGNOSIS — E785 Hyperlipidemia, unspecified: Secondary | ICD-10-CM | POA: Insufficient documentation

## 2017-06-25 DIAGNOSIS — Z888 Allergy status to other drugs, medicaments and biological substances status: Secondary | ICD-10-CM | POA: Diagnosis not present

## 2017-06-25 DIAGNOSIS — Z882 Allergy status to sulfonamides status: Secondary | ICD-10-CM | POA: Insufficient documentation

## 2017-06-25 DIAGNOSIS — K219 Gastro-esophageal reflux disease without esophagitis: Secondary | ICD-10-CM | POA: Diagnosis not present

## 2017-06-25 DIAGNOSIS — R21 Rash and other nonspecific skin eruption: Secondary | ICD-10-CM

## 2017-06-25 DIAGNOSIS — K760 Fatty (change of) liver, not elsewhere classified: Secondary | ICD-10-CM | POA: Diagnosis not present

## 2017-06-25 DIAGNOSIS — E119 Type 2 diabetes mellitus without complications: Secondary | ICD-10-CM | POA: Insufficient documentation

## 2017-06-25 DIAGNOSIS — G894 Chronic pain syndrome: Secondary | ICD-10-CM | POA: Diagnosis not present

## 2017-06-25 DIAGNOSIS — Z79891 Long term (current) use of opiate analgesic: Secondary | ICD-10-CM | POA: Diagnosis not present

## 2017-06-25 DIAGNOSIS — F329 Major depressive disorder, single episode, unspecified: Secondary | ICD-10-CM | POA: Diagnosis not present

## 2017-06-25 DIAGNOSIS — F17210 Nicotine dependence, cigarettes, uncomplicated: Secondary | ICD-10-CM | POA: Diagnosis not present

## 2017-06-25 DIAGNOSIS — M5116 Intervertebral disc disorders with radiculopathy, lumbar region: Secondary | ICD-10-CM | POA: Insufficient documentation

## 2017-06-25 DIAGNOSIS — Z79899 Other long term (current) drug therapy: Secondary | ICD-10-CM | POA: Insufficient documentation

## 2017-06-25 DIAGNOSIS — L509 Urticaria, unspecified: Secondary | ICD-10-CM | POA: Insufficient documentation

## 2017-06-25 DIAGNOSIS — J449 Chronic obstructive pulmonary disease, unspecified: Secondary | ICD-10-CM | POA: Diagnosis not present

## 2017-06-25 DIAGNOSIS — Z5181 Encounter for therapeutic drug level monitoring: Secondary | ICD-10-CM | POA: Insufficient documentation

## 2017-06-25 NOTE — Patient Instructions (Addendum)
Use topical ointment as prescribed- Benadryl cream that you can obtain over the counter

## 2017-06-25 NOTE — Progress Notes (Signed)
Patient's Name: [name]  MRN: [id number]  Referring Provider: Sharyne Peach, MD  DOB: 1960-05-30  PCP: Sharyne Peach, MD  DOS: 06/25/2017  Note by: Gillis Santa, MD  Service setting: Ambulatory outpatient  Specialty: Interventional Pain Management  Location: [facility] (AMB) Pain Management Facility    Patient type: Established   Primary Reason(s) for Visit: Evaluation of chronic illnesses with exacerbation, or progression (Level of risk: moderate) CC: Back Pain (rash all over back)  HPI  [name] is a 57 y.o. year old, Cook patient, who comes today for a follow-up evaluation. She has Essential hypertension; Diabetes ([place]); Fibromyalgia; COPD (chronic obstructive pulmonary disease) ([place]); Diverticulitis; Lumbar radiculopathy; Chronic bilateral low back pain with bilateral sciatica; Chronic pain syndrome; Chronic, continuous use of opioids; and Chronic myofascial pain on their problem list. [name] was last seen on 06/24/2017. Her primarily concern today is the Back Pain (rash all over back)  Pain Assessment: Location: Mid, Lower Back Radiating: radiates down both leg Onset: More than a month ago Duration: Chronic pain Quality: Constant, Discomfort, Aching, Nagging Severity: 9 /10 (self-reported pain score)  Note: Reported level is inconsistent with clinical observations. Clinically the patient looks like a 2/10 A 2/10 is viewed as "Mild to Moderate" and described as noticeable and distracting. Impossible to hide from other people. More frequent flare-ups. Still possible to adapt and function close to normal. It can be very annoying and may have occasional stronger flare-ups. With discipline, patients may get used to it and adapt.       When using our objective Pain Scale, levels between 6 and 10/10 are said to belong in an emergency room, as it progressively worsens from a 6/10, described as severely limiting, requiring emergency care not usually available at an outpatient  pain management facility. At a 6/10 level, communication becomes difficult and requires great effort. Assistance to reach the emergency department may be required. Facial flushing and profuse sweating along with potentially dangerous increases in heart rate and blood pressure will be evident. Effect on ADL:   Timing: Constant Modifying factors: hydrocodone, resting  57 year old Cook with a history of lumbar radiculopathy status post left L4-L5 epidural steroid injection on 06/16/2017 who has called the clinic on 2 occasions complaining of skin rash/hives on her low back.  Patient denies any fevers, drainage, redness, temperature change, new onset lower cavity weakness, bowel or bladder dysfunction, nausea vomiting.   Laboratory Chemistry  Inflammation Markers (CRP: Acute Phase) (ESR: Chronic Phase) Lab Results  Component Value Date   LATICACIDVEN 1.6 10/14/2016                         Rheumatology Markers No results found for: RF, ANA, Therisa Doy[place], [employer]                      Renal Function Markers Lab Results  Component Value Date   BUN 14 10/15/2016   CREATININE 0.76 10/15/2016   GFRAA >60 10/15/2016   GFRNONAA >60 10/15/2016                              Hepatic Function Markers Lab Results  Component Value Date   AST 16 10/14/2016   ALT 16 10/14/2016   ALBUMIN 4.5 10/14/2016   ALKPHOS 66 10/14/2016   LIPASE 24 10/14/2016  Electrolytes Lab Results  Component Value Date   NA 138 10/15/2016   K 4.4 10/15/2016   CL 107 10/15/2016   CALCIUM 8.3 (L) 10/15/2016                        Neuropathy Markers Lab Results  Component Value Date   HGBA1C 10.4 (H) 05/02/2016   HIV Non Reactive 10/15/2016                        Bone Pathology Markers No results found for: Jolly, [id number], [id number], [id number], 25OHVITD1, 25OHVITD2, 25OHVITD3, TESTOFREE, TESTOSTERONE                       Coagulation Parameters Lab Results   Component Value Date   PLT 182 10/15/2016                        Cardiovascular Markers Lab Results  Component Value Date   TROPONINI <0.03 10/16/2016   HGB 12.1 10/15/2016   HCT 37.1 10/15/2016                         CA Markers No results found for: CEA, CA125, LABCA2                      Note: Lab results reviewed.  Recent Diagnostic Imaging Review     Lumbosacral Imaging: Lumbar MR wo contrast:  Results for orders placed during the hospital encounter of 10/30/16  MR LUMBAR SPINE WO CONTRAST   Narrative CLINICAL DATA:  57 year old Cook with lumbar back pain radiating to both legs, knees. Symptoms for 1 months with no known injury.  EXAM: MRI LUMBAR SPINE WITHOUT CONTRAST  TECHNIQUE: Multiplanar, multisequence MR imaging of the lumbar spine was performed. No intravenous contrast was administered.  COMPARISON:  CT Abdomen and Pelvis 10/14/2016.  FINDINGS: Segmentation:  Normal as demonstrated on the comparison CT.  Alignment: Stable vertebral height and alignment, within normal limits.  Vertebrae: No marrow edema or evidence of acute osseous abnormality. Visualized bone marrow signal is within normal limits. Negative visible sacrum and SI joints.  Conus medullaris: Extends to the L1-L2. Level and appears normal.  Paraspinal and other soft tissues: Visible abdominal viscera today and the paraspinal soft tissues are within normal limits.  Disc levels:  T11-T12: Mild circumferential disc bulge. Mild to moderate facet hypertrophy greater on the left. Moderate bilateral T11 foraminal stenosis.  T12-L1:  Minimal disc bulge.  No stenosis.  L1-L2: Mild left eccentric circumferential disc bulge. No stenosis.  L2-L3: Mild mostly far lateral disc bulging. Small central disc extrusion (series 2, image 8). Mild facet hypertrophy. No stenosis.  L3-L4: Mild far lateral disc bulging. Mild facet and ligament flavum hypertrophy. No stenosis.  L4-L5: Mild far  lateral disc bulging. Mild facet and ligament flavum hypertrophy. No stenosis.  L5-S1: Negative disc. Mild facet and ligament flavum hypertrophy. Mild epidural lipomatosis. No stenosis.  IMPRESSION: 1. Mild lumbar disc and posterior element degeneration with no spinal stenosis or neural impingement. There is a subtle central disc extrusion at L2-L3. 2. Mild to moderate lower thoracic disc and posterior element degeneration at T11-T12 with moderate bilateral T11 foraminal stenosis. 3.  No osseous abnormality identified.   Electronically Signed   By: Genevie Ann M.D.   On: 10/30/2016 10:46    Complexity Note: Imaging results reviewed. Results  shared with [name], using Layman's terms.                         Meds   Current Outpatient Medications:  .  albuterol (PROVENTIL HFA;VENTOLIN HFA) 108 (90 Base) MCG/ACT inhaler, Inhale 1 puff into the lungs 2 (two) times daily as needed., Disp: , Rfl:  .  aspirin 81 MG tablet, Take 81 mg by mouth daily. , Disp: , Rfl:  .  citalopram (CELEXA) 40 MG tablet, Take 40 mg by mouth daily., Disp: , Rfl:  .  cyclobenzaprine (FLEXERIL) 10 MG tablet, Take 1 tablet (10 mg total) by mouth 3 (three) times daily., Disp: 30 tablet, Rfl: 0 .  fluticasone (FLONASE) 50 MCG/ACT nasal spray, Place 1 spray into both nostrils daily., Disp: , Rfl:  .  HYDROcodone-acetaminophen (NORCO/VICODIN) 5-325 MG tablet, Take 1 tablet by mouth 2 (two) times daily as needed. For chronic pain To last for 30 days from fill date, Disp: 60 tablet, Rfl: 0 .  lisinopril (PRINIVIL,ZESTRIL) 10 MG tablet, Take 10 mg by mouth daily. , Disp: , Rfl:  .  meloxicam (MOBIC) 15 MG tablet, Take 15 mg by mouth daily., Disp: , Rfl:  .  metFORMIN (GLUCOPHAGE) 1000 MG tablet, Take 1 tablet by mouth 2 (two) times daily., Disp: , Rfl:  .  mirtazapine (REMERON) 15 MG tablet, Take 15 mg by mouth at bedtime., Disp: , Rfl:  .  ondansetron (ZOFRAN) 4 MG tablet, Take 1 tablet by mouth every 8 (eight)  hours as needed., Disp: , Rfl:  .  pantoprazole (PROTONIX) 40 MG tablet, Take 40 mg by mouth daily., Disp: , Rfl:  .  polyethylene glycol (MIRALAX / GLYCOLAX) packet, Take 17 g by mouth daily., Disp: , Rfl:  .  ranitidine (ZANTAC) 300 MG tablet, Take 300 mg by mouth at bedtime., Disp: , Rfl:  .  rizatriptan (MAXALT) 10 MG tablet, Take 10 mg by mouth as needed for migraine. May repeat in 2 hours if needed, Disp: , Rfl:  .  rosuvastatin (CRESTOR) 20 MG tablet, Take 20 mg by mouth daily., Disp: , Rfl:  .  sucralfate (CARAFATE) 1 g tablet, Take 1 g by mouth 4 (four) times daily -  with meals and at bedtime., Disp: , Rfl:  .  topiramate (TOPAMAX) 25 MG tablet, Take 25 mg by mouth 2 (two) times daily., Disp: , Rfl:  .  traZODone (DESYREL) 100 MG tablet, Take 1 tablet by mouth at bedtime as needed., Disp: , Rfl:  .  gabapentin (NEURONTIN) 300 MG capsule, Take 3 capsules by mouth 3 (three) times daily., Disp: , Rfl:  .  meclizine (ANTIVERT) 25 MG tablet, Take 25 mg by mouth 3 (three) times daily as needed for dizziness., Disp: , Rfl:  .  nicotine (NICODERM CQ - DOSED IN MG/24 HOURS) 21 mg/24hr patch, Place 21 mg onto the skin daily., Disp: , Rfl:  .  simethicone (MYLICON) 80 MG chewable tablet, Chew 80 mg by mouth daily., Disp: , Rfl:   ROS  Constitutional: Denies any fever or chills Gastrointestinal: No reported hemesis, hematochezia, vomiting, or acute GI distress Musculoskeletal: Denies any acute onset joint swelling, redness, loss of ROM, or weakness Neurological: No reported episodes of acute onset apraxia, aphasia, dysarthria, agnosia, amnesia, paralysis, loss of coordination, or loss of consciousness  Allergies  [name] is allergic to tramadol; benadryl [diphenhydramine hcl]; bextra [valdecoxib]; invokana [canagliflozin]; and sulfamethoxazole-trimethoprim.  [place]  Drug: [name]  reports  that she does not use drugs. Alcohol:  reports that she does not drink alcohol. Tobacco:   reports that she has been smoking cigarettes.  She has been smoking about 0.50 packs per day. She uses smokeless tobacco. Medical:  has a past medical history of Asthma, Asthma, BPPV (benign paroxysmal positional vertigo), Degenerative disc disease, lumbar, Depression, Diabetes mellitus, Fatty liver, Fibromyalgia, Fibromyalgia, GERD (gastroesophageal reflux disease), History of degenerative disc disease, Hyperlipidemia, and Hypertension. Surgical: [name]  has a past surgical history that includes Abdominal surgery; Abdominal hysterectomy; Esophagogastroduodenoscopy (egd) with propofol (N/A, 02/20/2017); Colonoscopy with propofol (N/A, 02/20/2017); Upper esophageal endoscopic ultrasound (eus) (N/A, 04/03/2017); and Diverticulitis. Family: family history includes Diabetes in her mother; Hypertension in her mother.  Constitutional Exam  General appearance: Well nourished, well developed, and well hydrated. In no apparent acute distress Vitals:   06/25/17 1119  BP: 137/82  Pulse: (!) 110  Temp: 98.5 F (36.9 C)  SpO2: 100%  Weight: 120 lb (54.4 kg)  Height: 4' 11" (1.499 m)   BMI Assessment: Estimated body mass index is 24.24 kg/m as calculated from the following:   Height as of this encounter: 4' 11" (1.499 m).   Weight as of this encounter: 120 lb (54.4 kg).  BMI interpretation table: BMI level Category Range association with higher incidence of chronic pain  <18 kg/m2 Underweight   18.5-24.9 kg/m2 Ideal body weight   25-29.9 kg/m2 Overweight Increased incidence by 20%  30-34.9 kg/m2 Obese (Class I) Increased incidence by 68%  35-39.9 kg/m2 Severe obesity (Class II) Increased incidence by 136%  >40 kg/m2 Extreme obesity (Class III) Increased incidence by 254%   BMI Readings from Last 4 Encounters:  06/25/17 24.24 kg/m  06/16/17 24.24 kg/m  05/29/17 25.65 kg/m  04/03/17 25.45 kg/m   Wt Readings from Last 4 Encounters:  06/25/17 120 lb (54.4 kg)  06/16/17 120 lb (54.4  kg)  05/29/17 127 lb (57.6 kg)  04/03/17 126 lb (57.2 kg)  Psych/Mental status: Alert, oriented x 3 (person, place, & time)       Eyes: PERLA Respiratory: No evidence of acute respiratory distress  Cervical Spine Area Exam  Skin & Axial Inspection: No masses, redness, edema, swelling, or associated skin lesions Alignment: Symmetrical Functional ROM: Unrestricted ROM      Stability: No instability detected Muscle Tone/Strength: Functionally intact. No obvious neuro-muscular anomalies detected. Sensory (Neurological): Unimpaired Palpation: No palpable anomalies              Upper Extremity (UE) Exam    Side: Right upper extremity  Side: Left upper extremity  Skin & Extremity Inspection: Skin color, temperature, and hair growth are WNL. No peripheral edema or cyanosis. No masses, redness, swelling, asymmetry, or associated skin lesions. No contractures.  Skin & Extremity Inspection: Skin color, temperature, and hair growth are WNL. No peripheral edema or cyanosis. No masses, redness, swelling, asymmetry, or associated skin lesions. No contractures.  Functional ROM: Unrestricted ROM          Functional ROM: Unrestricted ROM          Muscle Tone/Strength: Functionally intact. No obvious neuro-muscular anomalies detected.  Muscle Tone/Strength: Functionally intact. No obvious neuro-muscular anomalies detected.  Sensory (Neurological): Unimpaired          Sensory (Neurological): Unimpaired          Palpation: No palpable anomalies              Palpation: No palpable anomalies  Specialized Test(s): Deferred         Specialized Test(s): Deferred          Thoracic Spine Area Exam  Skin & Axial Inspection: No masses, redness, or swelling Alignment: Symmetrical Functional ROM: Unrestricted ROM Stability: No instability detected Muscle Tone/Strength: Functionally intact. No obvious neuro-muscular anomalies detected. Sensory (Neurological): Unimpaired Muscle strength & Tone: No palpable  anomalies  Lumbar Spine Area Exam  Skin & Axial Inspection: No masses, redness, or swelling negative for erythema, drainage.  Injection site not tender to touch. Alignment: Symmetrical Functional ROM: Decreased ROM, bilaterally Stability: No instability detected Muscle Tone/Strength: Functionally intact. No obvious neuro-muscular anomalies detected. Sensory (Neurological): Dermatomal pain pattern slightly improved after ESI Palpation: No palpable anomalies       Provocative Tests: Lumbar Hyperextension and rotation test: evaluation deferred today       Lumbar Lateral bending test: evaluation deferred today       Patrick's Maneuver: evaluation deferred today                    Gait & Posture Assessment  Ambulation: Unassisted Gait: Relatively normal for age and body habitus Posture: WNL   Lower Extremity Exam    Side: Right lower extremity  Side: Left lower extremity  Skin & Extremity Inspection: Skin color, temperature, and hair growth are WNL. No peripheral edema or cyanosis. No masses, redness, swelling, asymmetry, or associated skin lesions. No contractures.  Skin & Extremity Inspection: Skin color, temperature, and hair growth are WNL. No peripheral edema or cyanosis. No masses, redness, swelling, asymmetry, or associated skin lesions. No contractures.  Functional ROM: Unrestricted ROM          Functional ROM: Unrestricted ROM          Muscle Tone/Strength: Functionally intact. No obvious neuro-muscular anomalies detected.  Muscle Tone/Strength: Functionally intact. No obvious neuro-muscular anomalies detected.  Sensory (Neurological): Unimpaired  Sensory (Neurological): Unimpaired  Palpation: No palpable anomalies  Palpation: No palpable anomalies   Assessment  Primary Diagnosis & Pertinent Problem List: The primary encounter diagnosis was Hives. Diagnoses of Rash, skin, Lumbar radiculopathy, and Chronic pain syndrome were also pertinent to this visit.  Status Diagnosis   Improving Improving Persistent 1. Hives   2. Rash, skin   3. Lumbar radiculopathy   4. Chronic pain syndrome      57 year old Cook who presents status post left L4-L5 ESI on 06/16/2017 complaining of a rash and hives at the location of her injection site.  Patient's lower back was visualized.  There is some skin irritation with hives present but no redness, no warmth to touch, no drainage.  There is nothing to suggest an infection at the time (no concerning signs or symptoms for lumbar epidural abscess or localized cellulitis).  Patient is afebrile, no change in lower extremity strength, denies any new onset lower extremity weakness.  I have instructed the patient to continue to monitor that area.  I have explained to her red flags that she should be aware of which include temperature greater than 100F, new onset lower extremity weakness, nausea vomiting, new onset axial low back pain, bowel or bladder dysfunction.  Patient denies all of these red flags.  I recommended the patient try topical diphenhydramine application to her low back to help out with the hives and skin rash.  Patient instructed to call us if her rash does not improve or if she develops any of the red flags mentioned above.  Patient has follow-up on 07/14/2017.  Future Appointments  Date Time Provider [place]  06/26/2017 10:00 AM [facility]  07/14/2017  2:15 PM Gillis Santa, MD [facility] None    Primary Care Physician: Sharyne Peach, MD Location: [facility] Note by: Gillis Santa, M.D Date: 06/25/2017; Time: 1:19 PM  Patient Instructions  Use topical ointment as prescribed- Benadryl cream that you can obtain over the counter

## 2017-06-26 ENCOUNTER — Ambulatory Visit
Admission: RE | Admit: 2017-06-26 | Discharge: 2017-06-26 | Disposition: A | Discharge status: Home / Self Care | Payer: Medicaid Other | Source: Ambulatory Visit | Attending: Gastroenterology | Admitting: Gastroenterology

## 2017-06-26 DIAGNOSIS — R1032 Left lower quadrant pain: Secondary | ICD-10-CM | POA: Insufficient documentation

## 2017-06-26 DIAGNOSIS — K5730 Diverticulosis of large intestine without perforation or abscess without bleeding: Secondary | ICD-10-CM | POA: Diagnosis not present

## 2017-06-26 DIAGNOSIS — I700 Atherosclerosis of aorta: Secondary | ICD-10-CM | POA: Diagnosis not present

## 2017-06-26 DIAGNOSIS — K5792 Diverticulitis of intestine, part unspecified, without perforation or abscess without bleeding: Secondary | ICD-10-CM

## 2017-06-26 DIAGNOSIS — R1031 Right lower quadrant pain: Secondary | ICD-10-CM | POA: Insufficient documentation

## 2017-06-26 LAB — POCT I-STAT CREATININE: Creatinine, Ser: 0.6 mg/dL (ref 0.44–1.00)

## 2017-06-26 MED ORDER — IOPAMIDOL (ISOVUE-300) INJECTION 61%
100.0000 mL | Freq: Once | INTRAVENOUS | Status: AC | PRN
  Administered 2017-06-26: 100 mL via INTRAVENOUS

## 2017-07-14 ENCOUNTER — Ambulatory Visit
Payer: Medicaid Other | Attending: Student in an Organized Health Care Education/Training Program | Admitting: Student in an Organized Health Care Education/Training Program

## 2017-07-14 ENCOUNTER — Encounter: Payer: Self-pay | Admitting: Student in an Organized Health Care Education/Training Program

## 2017-07-14 ENCOUNTER — Other Ambulatory Visit: Payer: Self-pay

## 2017-07-14 VITALS — BP 137/82 | HR 108 | Temp 97.1°F | Resp 16 | Ht 59.0 in | Wt 126.0 lb

## 2017-07-14 DIAGNOSIS — G8929 Other chronic pain: Secondary | ICD-10-CM

## 2017-07-14 DIAGNOSIS — I10 Essential (primary) hypertension: Secondary | ICD-10-CM | POA: Insufficient documentation

## 2017-07-14 DIAGNOSIS — I700 Atherosclerosis of aorta: Secondary | ICD-10-CM | POA: Insufficient documentation

## 2017-07-14 DIAGNOSIS — G894 Chronic pain syndrome: Secondary | ICD-10-CM | POA: Insufficient documentation

## 2017-07-14 DIAGNOSIS — M7918 Myalgia, other site: Secondary | ICD-10-CM | POA: Diagnosis not present

## 2017-07-14 DIAGNOSIS — Z882 Allergy status to sulfonamides status: Secondary | ICD-10-CM | POA: Insufficient documentation

## 2017-07-14 DIAGNOSIS — M5441 Lumbago with sciatica, right side: Secondary | ICD-10-CM | POA: Diagnosis not present

## 2017-07-14 DIAGNOSIS — M797 Fibromyalgia: Secondary | ICD-10-CM | POA: Diagnosis not present

## 2017-07-14 DIAGNOSIS — F1190 Opioid use, unspecified, uncomplicated: Secondary | ICD-10-CM

## 2017-07-14 DIAGNOSIS — E119 Type 2 diabetes mellitus without complications: Secondary | ICD-10-CM | POA: Insufficient documentation

## 2017-07-14 DIAGNOSIS — F329 Major depressive disorder, single episode, unspecified: Secondary | ICD-10-CM | POA: Insufficient documentation

## 2017-07-14 DIAGNOSIS — Z7982 Long term (current) use of aspirin: Secondary | ICD-10-CM | POA: Diagnosis not present

## 2017-07-14 DIAGNOSIS — M5442 Lumbago with sciatica, left side: Secondary | ICD-10-CM

## 2017-07-14 DIAGNOSIS — E785 Hyperlipidemia, unspecified: Secondary | ICD-10-CM | POA: Diagnosis not present

## 2017-07-14 DIAGNOSIS — J449 Chronic obstructive pulmonary disease, unspecified: Secondary | ICD-10-CM | POA: Insufficient documentation

## 2017-07-14 DIAGNOSIS — K219 Gastro-esophageal reflux disease without esophagitis: Secondary | ICD-10-CM | POA: Insufficient documentation

## 2017-07-14 DIAGNOSIS — M5116 Intervertebral disc disorders with radiculopathy, lumbar region: Secondary | ICD-10-CM | POA: Insufficient documentation

## 2017-07-14 DIAGNOSIS — Z7984 Long term (current) use of oral hypoglycemic drugs: Secondary | ICD-10-CM | POA: Diagnosis not present

## 2017-07-14 DIAGNOSIS — Z888 Allergy status to other drugs, medicaments and biological substances status: Secondary | ICD-10-CM | POA: Insufficient documentation

## 2017-07-14 DIAGNOSIS — Z79891 Long term (current) use of opiate analgesic: Secondary | ICD-10-CM | POA: Insufficient documentation

## 2017-07-14 DIAGNOSIS — F17210 Nicotine dependence, cigarettes, uncomplicated: Secondary | ICD-10-CM | POA: Diagnosis not present

## 2017-07-14 DIAGNOSIS — M5416 Radiculopathy, lumbar region: Secondary | ICD-10-CM

## 2017-07-14 DIAGNOSIS — Z5181 Encounter for therapeutic drug level monitoring: Secondary | ICD-10-CM | POA: Diagnosis present

## 2017-07-14 DIAGNOSIS — Z79899 Other long term (current) drug therapy: Secondary | ICD-10-CM | POA: Diagnosis not present

## 2017-07-14 MED ORDER — HYDROCODONE-ACETAMINOPHEN 5-325 MG PO TABS: 1.0000 | ORAL_TABLET | Freq: Three times a day (TID) | ORAL | 0 refills | Status: DC | PRN

## 2017-07-14 NOTE — Progress Notes (Signed)
Patient's Name: [name]  MRN: [id number]  Referring Provider: Sharyne Peach, MD  DOB: 02-Dec-1960  PCP: Sharyne Peach, MD  DOS: 07/14/2017  Note by: Gillis Santa, MD  Service setting: Ambulatory outpatient  Specialty: Interventional Pain Management  Location: [facility] (AMB) Pain Management Facility    Patient type: Established   Primary Reason(s) for Visit: Encounter for prescription drug management & post-procedure evaluation of chronic illness with mild to moderate exacerbation(Level of risk: moderate) CC: Leg Pain (bilateral ) and Back Pain (lower mid )  HPI  [name] is a 57 y.o. year old, female patient, who comes today for a post-procedure evaluation and medication management. She has Essential hypertension; Diabetes ([place]); Fibromyalgia; COPD (chronic obstructive pulmonary disease) ([place]); Diverticulitis; Lumbar radiculopathy; Chronic bilateral low back pain with bilateral sciatica; Chronic pain syndrome; Chronic, continuous use of opioids; and Chronic myofascial pain on their problem list. Her primarily concern today is the Leg Pain (bilateral ) and Back Pain (lower mid )  Pain Assessment: Location: Left, Right(left leg is worse) Leg Radiating: Right leg radiates down to feet and left leg radiates to calf area.  Onset: More than a month ago Duration: Chronic pain Quality: Stabbing Severity: 9 /10 (self-reported pain score)  Note: Reported level is inconsistent with clinical observations. Clinically the patient looks like a 3/10 A 3/10 is viewed as "Moderate" and described as significantly interfering with activities of daily living (ADL). It becomes difficult to feed, bathe, get dressed, get on and off the toilet or to perform personal hygiene functions. Difficult to get in and out of bed or a chair without assistance. Very distracting. With effort, it can be ignored when deeply involved in activities.       When using our objective Pain Scale, levels between 6 and 10/10  are said to belong in an emergency room, as it progressively worsens from a 6/10, described as severely limiting, requiring emergency care not usually available at an outpatient pain management facility. At a 6/10 level, communication becomes difficult and requires great effort. Assistance to reach the emergency department may be required. Facial flushing and profuse sweating along with potentially dangerous increases in heart rate and blood pressure will be evident. Effect on ADL: Prolong walking  Timing: Constant Modifying factors: Medication; resting   [name] was last seen on 06/25/2017 for a procedure. During today's appointment we reviewed [name]'s post-procedure results, as well as her outpatient medication regimen.  Further details on both, my assessment(s), as well as the proposed treatment plan, please see below.  Controlled Substance Pharmacotherapy Assessment REMS (Risk Evaluation and Mitigation Strategy)  Analgesic: Hydrocodone 5 mg BID prn MME/day:10 mg/day.  Janne Napoleon, RN  07/14/2017  2:14 PM  Sign at close encounter Nursing Pain Medication Assessment:  Safety precautions to be maintained throughout the outpatient stay will include: orient to surroundings, keep bed in low position, maintain call bell within reach at all times, provide assistance with transfer out of bed and ambulation.  Medication Inspection Compliance: Pill count conducted under aseptic conditions, in front of the patient. Neither the pills nor the bottle was removed from the patient's sight at any time. Once count was completed pills were immediately returned to the patient in their original bottle.  Medication: Hydrocodone/APAP Pill/Patch Count: 04 of 60 pills remain Pill/Patch Appearance: Markings consistent with prescribed medication Bottle Appearance: Standard pharmacy container. Clearly labeled. Filled Date: 03 / 29 / 2019   Last Medication intake:  TodaySafety precautions to be  maintained throughout the outpatient stay will include: orient to surroundings, keep bed in low position, maintain call bell within reach at all times, provide assistance with transfer out of bed and ambulation.    Pharmacokinetics: Liberation and absorption (onset of action): WNL Distribution (time to peak effect): WNL Metabolism and excretion (duration of action): WNL         Pharmacodynamics: Desired effects: Analgesia: [name] reports 50% benefit. Functional ability: Patient reports that medication allows her to accomplish basic ADLs Clinically meaningful improvement in function (CMIF): Sustained CMIF goals met Perceived effectiveness: Described as relatively effective but with some room for improvement Undesirable effects: Side-effects or Adverse reactions: None reported Monitoring: [place] PMP: Online review of the past 26-monthperiod conducted. Compliant with practice rules and regulations Last UDS on record: Summary  Date Value Ref Range Status  05/29/2017 FINAL  Final    Comment:    ==================================================================== TOX[facility]RE COMP DRUG ANALYSIS,UR ==================================================================== Test                             Result       Flag       Units Drug Present and Declared for Prescription Verification   Hydrocodone                    393          EXPECTED   ng/mg creat   Norhydrocodone                 903          EXPECTED   ng/mg creat    Sources of hydrocodone include scheduled prescription    medications. Norhydrocodone is an expected metabolite of    hydrocodone.   Topiramate                     PRESENT      EXPECTED   Cyclobenzaprine                PRESENT      EXPECTED   Desmethylcyclobenzaprine       PRESENT      EXPECTED    Desmethylcyclobenzaprine is an expected metabolite of    cyclobenzaprine.   Citalopram                     PRESENT      EXPECTED   Desmethylcitalopram            PRESENT       EXPECTED    Desmethylcitalopram is an expected metabolite of citalopram or    the enantiomeric form, escitalopram.   Trazodone                      PRESENT      EXPECTED   1,3 chlorophenyl piperazine    PRESENT      EXPECTED    1,3-chlorophenyl piperazine is an expected metabolite of    trazodone.   Acetaminophen                  PRESENT      EXPECTED Drug Present not Declared for Prescription Verification   Dextromethorphan               PRESENT      UNEXPECTED   Dextrorphan/Levorphanol        PRESENT      UNEXPECTED    Dextrorphan is  an expected metabolite of dextromethorphan, an    over-the-counter or prescription cough suppressant. Dextrorphan    cannot be distinguished from the scheduled prescription    medication levorphanol by the method used for analysis. Drug Absent but Declared for Prescription Verification   Gabapentin                     Not Detected UNEXPECTED   Salicylate                     Not Detected UNEXPECTED    Aspirin, as indicated in the declared medication list, is not    always detected even when used as directed. ==================================================================== Test                      Result    Flag   Units      Ref Range   Creatinine              80               mg/dL      >=20 ==================================================================== Declared Medications:  The flagging and interpretation on this report are based on the  following declared medications.  Unexpected results may arise from  inaccuracies in the declared medications.  **Note: The testing scope of this panel includes these medications:  Citalopram  Cyclobenzaprine  Gabapentin  Hydrocodone (Hydrocodone-Acetaminophen)  Topiramate  Trazodone  **Note: The testing scope of this panel does not include small to  moderate amounts of these reported medications:  Acetaminophen (Hydrocodone-Acetaminophen)  Aspirin (Aspirin 81)  **Note: The testing scope of this panel  does not include following  reported medications:  Albuterol  Fluticasone  Lisinopril  Meclizine  Meloxicam  Metformin  Nicotine  Ondansetron  Pantoprazole  Polyethylene Glycol  Ranitidine (Zantac)  Rizatriptan  Rosuvastatin (Crestor)  Simethicone (Mylicon)  Sucralfate ==================================================================== For clinical consultation, please call [phone]. ====================================================================    UDS interpretation: Compliant          Medication Assessment Form: Reviewed. Patient indicates being compliant with therapy Treatment compliance: Compliant Risk Assessment Profile: Aberrant behavior: See prior evaluations. None observed or detected today Comorbid factors increasing risk of overdose: See prior notes. No additional risks detected today Risk of substance use disorder (SUD): High Opioid Risk Tool - 07/14/17 1423      Family History of Substance Abuse   Alcohol  Positive Female    Illegal Drugs  Negative    Rx Drugs  Negative      Personal History of Substance Abuse   Alcohol  Positive Female or Female    Illegal Drugs  Positive Female or Female    Rx Drugs  Negative      Age   Age between 41-45 years   No      History of Preadolescent Sexual Abuse   History of Preadolescent Sexual Abuse  Negative or Female      Psychological Disease   Psychological Disease  Negative    Depression  Negative      Total Score   Opioid Risk Tool Scoring  8    Opioid Risk Interpretation  High Risk      ORT Scoring interpretation table:  Score <3 = Low Risk for SUD  Score between 4-7 = Moderate Risk for SUD  Score >8 = High Risk for Opioid Abuse   Risk Mitigation Strategies:  Patient Counseling: Covered Patient-Prescriber Agreement (PPA): Present and active  Notification to  other healthcare providers: Done  Pharmacologic Plan: Increase Hydrocodone from 5 mg BID-->TID prn           No further dose escalation  beyond this.  Post-Procedure Assessment  06/25/2017 Procedure: Left L4-L5 Pre-procedure pain score:  8/10 Post-procedure pain score: 3/10         Influential Factors: BMI: 25.45 kg/m Intra-procedural challenges: None observed.         Assessment challenges: None detected.              Reported side-effects: None.        Post-procedural adverse reactions or complications: None reported         Sedation: Please see nurses note. When no sedatives are used, the analgesic levels obtained are directly associated to the effectiveness of the local anesthetics. However, when sedation is provided, the level of analgesia obtained during the initial 1 hour following the intervention, is believed to be the result of a combination of factors. These factors may include, but are not limited to: 1. The effectiveness of the local anesthetics used. 2. The effects of the analgesic(s) and/or anxiolytic(s) used. 3. The degree of discomfort experienced by the patient at the time of the procedure. 4. The patients ability and reliability in recalling and recording the events. 5. The presence and influence of possible secondary gains and/or psychosocial factors. Reported result: Relief experienced during the 1st hour after the procedure: 70 % (Ultra-Short Term Relief)            Interpretative annotation: Clinically appropriate result. Analgesia during this period is likely to be Local Anesthetic and/or IV Sedative (Analgesic/Anxiolytic) related.          Effects of local anesthetic: The analgesic effects attained during this period are directly associated to the localized infiltration of local anesthetics and therefore cary significant diagnostic value as to the etiological location, or anatomical origin, of the pain. Expected duration of relief is directly dependent on the pharmacodynamics of the local anesthetic used. Long-acting (4-6 hours) anesthetics used.  Reported result: Relief during the next 4 to 6 hour after  the procedure: 90 % (Short-Term Relief)            Interpretative annotation: Clinically appropriate result. Analgesia during this period is likely to be Local Anesthetic-related.          Long-term benefit: Defined as the period of time past the expected duration of local anesthetics (1 hour for short-acting and 4-6 hours for long-acting). With the possible exception of prolonged sympathetic blockade from the local anesthetics, benefits during this period are typically attributed to, or associated with, other factors such as analgesic sensory neuropraxia, antiinflammatory effects, or beneficial biochemical changes provided by agents other than the local anesthetics.  Reported result: Extended relief following procedure: 20%(Long-Term Relief)            Interpretative annotation: Expected clinical result. No long-term benefit. No permanent benefit expected. Limited inflammation. Possible mechanical aggravating factors.          Current benefits: Defined as reported results that persistent at this point in time.   Analgesia: 0 %            Function: Back to baseline ROM: Back to baseline Interpretative annotation: Recurrence of symptoms. No permanent benefit expected. Results would suggest persistent aggravating factors.          Interpretation: Results would suggest failure of therapy in achieving desired goal(s).  Plan:  Please see "Plan of Care" for details.                Laboratory Chemistry  Inflammation Markers (CRP: Acute Phase) (ESR: Chronic Phase) Lab Results  Component Value Date   LATICACIDVEN 1.6 10/14/2016                         Rheumatology Markers No results found for: Elayne Guerin, [facility]                      Renal Function Markers Lab Results  Component Value Date   BUN 14 10/15/2016   CREATININE 0.60 06/26/2017   GFRAA >60 10/15/2016   GFRNONAA >60 10/15/2016                              Hepatic Function  Markers Lab Results  Component Value Date   AST 16 10/14/2016   ALT 16 10/14/2016   ALBUMIN 4.5 10/14/2016   ALKPHOS 66 10/14/2016   LIPASE 24 10/14/2016                        Electrolytes Lab Results  Component Value Date   NA 138 10/15/2016   K 4.4 10/15/2016   CL 107 10/15/2016   CALCIUM 8.3 (L) 10/15/2016                        Neuropathy Markers Lab Results  Component Value Date   HGBA1C 10.4 (H) 05/02/2016   HIV Non Reactive 10/15/2016                        Bone Pathology Markers No results found for: [place], [id number], WE3154MG8, [id number], 25OHVITD1, 25OHVITD2, 25OHVITD3, TESTOFREE, TESTOSTERONE                       Coagulation Parameters Lab Results  Component Value Date   PLT 182 10/15/2016                        Cardiovascular Markers Lab Results  Component Value Date   TROPONINI <0.03 10/16/2016   HGB 12.1 10/15/2016   HCT 37.1 10/15/2016                         CA Markers No results found for: CEA, CA125, LABCA2                      Note: Lab results reviewed.  Recent Diagnostic Imaging Results  CT ABDOMEN PELVIS W CONTRAST CLINICAL DATA:  Left-sided abdominal pain despite antibiotic treatment  EXAM: CT ABDOMEN AND PELVIS WITH CONTRAST  TECHNIQUE: Multidetector CT imaging of the abdomen and pelvis was performed using the standard protocol following bolus administration of intravenous contrast.  CONTRAST:  120m ISOVUE-300 IOPAMIDOL (ISOVUE-300) INJECTION 61%  COMPARISON:  CT abdomen pelvis of 10/14/2016  FINDINGS: Lower chest: The lung bases are clear. Cardiomegaly is stable. No pericardial effusion is seen.  Hepatobiliary: The liver enhances with no focal abnormality and no ductal dilatation is seen. The left lobe of liver is somewhat prominent but stable compared to the prior CT. A small single gallstone within the gallbladder cannot be excluded.  Pancreas: The pancreas is normal in size  and the pancreatic duct is not  dilated.  Spleen: The spleen is unremarkable.  Adrenals/Urinary Tract: The adrenal glands appear normal. The kidneys enhance with no calculus or mass and on delayed images, no hydronephrosis is seen. The ureters are normal in caliber. The urinary bladder is moderately well distended with no abnormality noted.  Stomach/Bowel: The stomach is moderately distended with oral contrast and food debris. No abnormality of small bowel is seen. There are a few scattered rectosigmoid and descending colon diverticula present with a few scattered throughout the more proximal colon as well. A moderate to large amount of feces is noted throughout the entire colon. The terminal ileum is well opacified with no abnormality. There is no present evidence of diverticulitis.  Vascular/Lymphatic: The abdominal aorta is normal in caliber with moderate abdominal aortic atherosclerosis present. No adenopathy is seen.  Reproductive: The uterus has previously been resected. No adnexal lesion is seen. No fluid is noted within the pelvis.  Other: No abdominal wall hernia is seen.  Musculoskeletal: The lumbar vertebrae are in normal alignment with normal intervertebral disc spaces. The SI joints are corticated.  IMPRESSION: 1. Although there are scattered colonic diverticula, there is no present evidence of diverticulitis. The appendix and terminal ileum also are unremarkable. 2. There is a moderately large amount of feces throughout the colon. 3. Cannot exclude a single small gallstone within the gallbladder. 4. Moderate abdominal aortic atherosclerosis.  Electronically Signed   By: Ivar Drape M.D.   On: 06/26/2017 14:14  Complexity Note: Imaging results reviewed. Results shared with [name], using Layman's terms.                         Meds   Current Outpatient Medications:  .  albuterol (PROVENTIL HFA;VENTOLIN HFA) 108 (90 Base) MCG/ACT inhaler, Inhale 1 puff into the lungs 2 (two) times  daily as needed., Disp: , Rfl:  .  aspirin 81 MG tablet, Take 81 mg by mouth daily. , Disp: , Rfl:  .  citalopram (CELEXA) 40 MG tablet, Take 40 mg by mouth daily., Disp: , Rfl:  .  cyclobenzaprine (FLEXERIL) 10 MG tablet, Take 1 tablet (10 mg total) by mouth 3 (three) times daily., Disp: 30 tablet, Rfl: 0 .  fluticasone (FLONASE) 50 MCG/ACT nasal spray, Place 1 spray into both nostrils daily., Disp: , Rfl:  .  HYDROcodone-acetaminophen (NORCO/VICODIN) 5-325 MG tablet, Take 1 tablet by mouth 3 (three) times daily as needed. For chronic pain To last for 30 days from fill date, Disp: 90 tablet, Rfl: 0 .  lisinopril (PRINIVIL,ZESTRIL) 10 MG tablet, Take 10 mg by mouth daily. , Disp: , Rfl:  .  meloxicam (MOBIC) 15 MG tablet, Take 15 mg by mouth daily., Disp: , Rfl:  .  metFORMIN (GLUCOPHAGE) 1000 MG tablet, Take 1 tablet by mouth 2 (two) times daily., Disp: , Rfl:  .  mirtazapine (REMERON) 15 MG tablet, Take 15 mg by mouth at bedtime., Disp: , Rfl:  .  pantoprazole (PROTONIX) 40 MG tablet, Take 40 mg by mouth daily., Disp: , Rfl:  .  polyethylene glycol (MIRALAX / GLYCOLAX) packet, Take 17 g by mouth daily., Disp: , Rfl:  .  ranitidine (ZANTAC) 300 MG tablet, Take 300 mg by mouth at bedtime., Disp: , Rfl:  .  rizatriptan (MAXALT) 10 MG tablet, Take 10 mg by mouth as needed for migraine. May repeat in 2 hours if needed, Disp: , Rfl:  .  rosuvastatin (CRESTOR) 20  MG tablet, Take 20 mg by mouth daily., Disp: , Rfl:  .  sucralfate (CARAFATE) 1 g tablet, Take 1 g by mouth 4 (four) times daily -  with meals and at bedtime., Disp: , Rfl:  .  topiramate (TOPAMAX) 25 MG tablet, Take 25 mg by mouth 2 (two) times daily., Disp: , Rfl:  .  traZODone (DESYREL) 100 MG tablet, Take 1 tablet by mouth at bedtime as needed., Disp: , Rfl:  .  gabapentin (NEURONTIN) 300 MG capsule, Take 3 capsules by mouth 3 (three) times daily., Disp: , Rfl:  .  meclizine (ANTIVERT) 25 MG tablet, Take 25 mg by mouth 3 (three) times  daily as needed for dizziness., Disp: , Rfl:  .  nicotine (NICODERM CQ - DOSED IN MG/24 HOURS) 21 mg/24hr patch, Place 21 mg onto the skin daily., Disp: , Rfl:  .  simethicone (MYLICON) 80 MG chewable tablet, Chew 80 mg by mouth daily., Disp: , Rfl:   ROS  Constitutional: Denies any fever or chills Gastrointestinal: No reported hemesis, hematochezia, vomiting, or acute GI distress Musculoskeletal: Denies any acute onset joint swelling, redness, loss of ROM, or weakness Neurological: No reported episodes of acute onset apraxia, aphasia, dysarthria, agnosia, amnesia, paralysis, loss of coordination, or loss of consciousness  Allergies  [name] is allergic to tramadol; benadryl [diphenhydramine hcl]; bextra [valdecoxib]; invokana [canagliflozin]; and sulfamethoxazole-trimethoprim.  PFSH  Drug: [name]  reports that she does not use drugs. Alcohol:  reports that she does not drink alcohol. Tobacco:  reports that she has been smoking cigarettes.  She has been smoking about 0.50 packs per day. She uses smokeless tobacco. Medical:  has a past medical history of Asthma, Asthma, BPPV (benign paroxysmal positional vertigo), Degenerative disc disease, lumbar, Depression, Diabetes mellitus, Fatty liver, Fibromyalgia, Fibromyalgia, GERD (gastroesophageal reflux disease), History of degenerative disc disease, Hyperlipidemia, and Hypertension. Surgical: [name]  has a past surgical history that includes Abdominal surgery; Abdominal hysterectomy; Esophagogastroduodenoscopy (egd) with propofol (N/A, 02/20/2017); Colonoscopy with propofol (N/A, 02/20/2017); Upper esophageal endoscopic ultrasound (eus) (N/A, 04/03/2017); and Diverticulitis. Family: family history includes Diabetes in her mother; Hypertension in her mother.  Constitutional Exam  General appearance: Well nourished, well developed, and well hydrated. In no apparent acute distress Vitals:   07/14/17 1410  BP: 137/82  Pulse:  (!) 108  Resp: 16  Temp: (!) 97.1 F (36.2 C)  SpO2: 96%  Weight: 126 lb (57.2 kg)  Height: '4\' 11"'  (1.499 m)   BMI Assessment: Estimated body mass index is 25.45 kg/m as calculated from the following:   Height as of this encounter: '4\' 11"'  (1.499 m).   Weight as of this encounter: 126 lb (57.2 kg).  BMI interpretation table: BMI level Category Range association with higher incidence of chronic pain  <18 kg/m2 Underweight   18.5-24.9 kg/m2 Ideal body weight   25-29.9 kg/m2 Overweight Increased incidence by 20%  30-34.9 kg/m2 Obese (Class I) Increased incidence by 68%  35-39.9 kg/m2 Severe obesity (Class II) Increased incidence by 136%  >40 kg/m2 Extreme obesity (Class III) Increased incidence by 254%   BMI Readings from Last 4 Encounters:  07/14/17 25.45 kg/m  06/25/17 24.24 kg/m  06/16/17 24.24 kg/m  05/29/17 25.65 kg/m   Wt Readings from Last 4 Encounters:  07/14/17 126 lb (57.2 kg)  06/25/17 120 lb (54.4 kg)  06/16/17 120 lb (54.4 kg)  05/29/17 127 lb (57.6 kg)  Psych/Mental status: Alert, oriented x 3 (person, place, & time)  Eyes: PERLA Respiratory: No evidence of acute respiratory distress  Cervical Spine Area Exam  Skin & Axial Inspection: No masses, redness, edema, swelling, or associated skin lesions Alignment: Symmetrical Functional ROM: Unrestricted ROM      Stability: No instability detected Muscle Tone/Strength: Functionally intact. No obvious neuro-muscular anomalies detected. Sensory (Neurological): Unimpaired Palpation: No palpable anomalies              Upper Extremity (UE) Exam    Side: Right upper extremity  Side: Left upper extremity  Skin & Extremity Inspection: Skin color, temperature, and hair growth are WNL. No peripheral edema or cyanosis. No masses, redness, swelling, asymmetry, or associated skin lesions. No contractures.  Skin & Extremity Inspection: Skin color, temperature, and hair growth are WNL. No peripheral edema or cyanosis. No  masses, redness, swelling, asymmetry, or associated skin lesions. No contractures.  Functional ROM: Unrestricted ROM          Functional ROM: Unrestricted ROM          Muscle Tone/Strength: Functionally intact. No obvious neuro-muscular anomalies detected.  Muscle Tone/Strength: Functionally intact. No obvious neuro-muscular anomalies detected.  Sensory (Neurological): Unimpaired          Sensory (Neurological): Unimpaired          Palpation: No palpable anomalies              Palpation: No palpable anomalies              Specialized Test(s): Deferred         Specialized Test(s): Deferred          Thoracic Spine Area Exam  Skin & Axial Inspection: No masses, redness, or swelling Alignment: Symmetrical Functional ROM: Unrestricted ROM Stability: No instability detected Muscle Tone/Strength: Functionally intact. No obvious neuro-muscular anomalies detected. Sensory (Neurological): Unimpaired Muscle strength & Tone: No palpable anomalies  Lumbar Spine Area Exam  Skin & Axial Inspection: mild redness lower lumbar region near waistband. Alignment: Symmetrical Functional ROM: Decreased ROM affecting both sides Stability: No instability detected Muscle Tone/Strength: Functionally intact. No obvious neuro-muscular anomalies detected. Sensory (Neurological): Musculoskeletal pain pattern Palpation: Complains of area being tender to palpation Bilateral Fist Percussion Test Provocative Tests: Lumbar Hyperextension and rotation test: Positive bilaterally for facet joint pain. Lumbar Lateral bending test: Positive due to pain. Patrick's Maneuver: evaluation deferred today                    Gait & Posture Assessment  Ambulation: Unassisted Gait: Relatively normal for age and body habitus Posture: WNL   Lower Extremity Exam    Side: Right lower extremity  Side: Left lower extremity  Skin & Extremity Inspection: Skin color, temperature, and hair growth are WNL. No peripheral edema or cyanosis. No  masses, redness, swelling, asymmetry, or associated skin lesions. No contractures.  Skin & Extremity Inspection: Skin color, temperature, and hair growth are WNL. No peripheral edema or cyanosis. No masses, redness, swelling, asymmetry, or associated skin lesions. No contractures.  Functional ROM: Unrestricted ROM          Functional ROM: Unrestricted ROM          Muscle Tone/Strength: Functionally intact. No obvious neuro-muscular anomalies detected.  Muscle Tone/Strength: Functionally intact. No obvious neuro-muscular anomalies detected.  Sensory (Neurological): Unimpaired  Sensory (Neurological): Unimpaired  Palpation: No palpable anomalies  Palpation: No palpable anomalies   Assessment  Primary Diagnosis & Pertinent Problem List: The primary encounter diagnosis was Lumbar radiculopathy. Diagnoses of Chronic bilateral low back pain  with bilateral sciatica, Chronic pain syndrome, Chronic myofascial pain, and Chronic, continuous use of opioids were also pertinent to this visit.  Status Diagnosis  Persistent Persistent Persistent 1. Lumbar radiculopathy   2. Chronic bilateral low back pain with bilateral sciatica   3. Chronic pain syndrome   4. Chronic myofascial pain   5. Chronic, continuous use of opioids      General Recommendations: The pain condition that the patient suffers from is best treated with a multidisciplinary approach that involves an increase in physical activity to prevent de-conditioning and worsening of the pain cycle, as well as psychological counseling (formal and/or informal) to address the co-morbid psychological affects of pain. Treatment will often involve judicious use of pain medications and interventional procedures to decrease the pain, allowing the patient to participate in the physical activity that will ultimately produce long-lasting pain reductions. The goal of the multidisciplinary approach is to return the patient to a higher level of overall function and to  restore their ability to perform activities of daily living.  57 year old female who presents with a chief complaint of axial low back pain that radiates down her bilateral buttocks, lateral hips and then to her calves bilaterally.  This is secondary to lumbar radiculopathy, lumbar degenerative disc disease.  Patient returns today for follow-up status post lumbar ESI performed on 06/16/2017.  Patient returns today for follow-up.  Her rash near her epidural site has significantly improved.  There is some mild redness near the waist line.  Other than that the patient states that the epidural did not provide significant pain relief for an extended duration of time.  She states that the first 3-4 days resulted in less cramping of her casts and thighs.  Given improvement in her lower extremity spasms, she was able to ambulate for an extended period of time without having a significant pain however after 4 days she states that she had return of her lower extreme knee pain as well as spasms of her thigh and calf muscles.  Do not recommend repeating lumbar ESI since the patient did not experience significant benefit from her previous injection.  Patient is tried gabapentin and Lyrica in the past which were not effective.  She has also tried tramadol which was not effective.  She has tried physical therapy in the past.  Her current medications include Flexeril 10 mg 3 times daily, citalopram 40 mg daily, Topamax 25 mg twice daily as needed migraines.  She was prescribed hydrocodone by me on her last visit 5 mg twice daily as needed.  She states that she finds this medication effective in helping with her axial low back pain symptoms.  She states that she on some days needs to take an extra tablet in the evening after she has finished cooking dinner and cleaning.  Given that the patient has tried various non-opioid analgesics in the past, I think it is okay to increase her hydrocodone to 5 mg 3 times daily as needed.  I  encouraged the patient to try and take this medication only as needed and that we would not be increasing her dose beyond this.  Also repeat urine drug screen today.  I expect this to be positive for hydrocodone and its metabolites only.  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  Given  prior history of cocaine and alcohol abuse, monthly fills only and low threshold to obtain UDS.  No further dose escalation beyond hydrocodone 5 mg 3 times daily as needed. -UDS today. -Continue Flexeril 10 mg 3 times daily as needed, Topamax 25 mg twice daily as needed as prescribed. -Follow-up in 1 month.    Plan of Care  Pharmacotherapy (Medications Ordered): Meds ordered this encounter  Medications  . HYDROcodone-acetaminophen (NORCO/VICODIN) 5-325 MG tablet    Sig: Take 1 tablet by mouth 3 (three) times daily as needed. For chronic pain To last for 30 days from fill date    Dispense:  90 tablet    Refill:  0   Lab-work, procedure(s), and/or referral(s): Orders Placed This Encounter  Procedures  . ToxASSURE Select 13 (MW), Urine    Provider-requested follow-up: Return in about 1 month (around 08/11/2017) for Medication Management. Time Note: Greater than 50% of the 25 minute(s) of face-to-face time spent with [name], was spent in counseling/coordination of care regarding: [name]'s primary cause of pain, the treatment plan, treatment alternatives, the results, interpretation and significance of  her recent diagnostic interventional treatment(s), the appropriate use of her medications, realistic expectations, the goals of pain management (increased in functionality), the medication agreement and the patient's responsibilities when it comes to controlled substances.  Future Appointments  Date Time  Provider [place]  08/05/2017 10:45 AM Gillis Santa, MD [facility] None    Primary Care Physician: Sharyne Peach, MD Location: [facility] Note by: Gillis Santa, M.D Date: 07/14/2017; Time: 3:21 PM  Patient Instructions  You have been given one prescription for Hydrocodone-Acetaminophen (Norco) today. Make follow up appointment for one month (around May 20th).

## 2017-07-14 NOTE — Progress Notes (Signed)
Nursing Pain Medication Assessment:  Safety precautions to be maintained throughout the outpatient stay will include: orient to surroundings, keep bed in low position, maintain call bell within reach at all times, provide assistance with transfer out of bed and ambulation.  Medication Inspection Compliance: Pill count conducted under aseptic conditions, in front of the patient. Neither the pills nor the bottle was removed from the patient's sight at any time. Once count was completed pills were immediately returned to the patient in their original bottle.  Medication: Hydrocodone/APAP Pill/Patch Count: 04 of 60 pills remain Pill/Patch Appearance: Markings consistent with prescribed medication Bottle Appearance: Standard pharmacy container. Clearly labeled. Filled Date: 03 / 29 / 2019   Last Medication intake:  TodaySafety precautions to be maintained throughout the outpatient stay will include: orient to surroundings, keep bed in low position, maintain call bell within reach at all times, provide assistance with transfer out of bed and ambulation.

## 2017-07-14 NOTE — Patient Instructions (Addendum)
You have been given one prescription for Hydrocodone-Acetaminophen (Norco) today. Make follow up appointment for one month (around May 20th).

## 2017-07-15 ENCOUNTER — Telehealth: Payer: Self-pay | Admitting: Student in an Organized Health Care Education/Training Program

## 2017-07-15 NOTE — Telephone Encounter (Signed)
PA sent for hydrocone - apap 5-325 mg

## 2017-07-15 NOTE — Telephone Encounter (Signed)
Patient states pharmacy needs prior auth to fill scripts received on 07-14-17

## 2017-07-16 ENCOUNTER — Telehealth: Payer: Self-pay

## 2017-07-16 NOTE — Telephone Encounter (Signed)
Pt wants nurse to call her back about prior auth on medication

## 2017-07-16 NOTE — Telephone Encounter (Signed)
Called and talked to pt and informed her that PA was done today and sent in.

## 2017-07-17 ENCOUNTER — Telehealth: Payer: Self-pay | Admitting: Student in an Organized Health Care Education/Training Program

## 2017-07-17 NOTE — Telephone Encounter (Signed)
[facility], they are requesting notes supporting need for a short acting opiod. Sent. Patient notified.

## 2017-07-17 NOTE — Telephone Encounter (Signed)
Pharmacy told patient, medicaid needed more information to authorize meds for patient. Please check PA so she can pick up meds today and call her

## 2017-07-19 LAB — TOXASSURE SELECT 13 (MW), URINE

## 2017-07-24 ENCOUNTER — Other Ambulatory Visit: Payer: Self-pay | Admitting: Family Medicine

## 2017-07-24 DIAGNOSIS — Z1239 Encounter for other screening for malignant neoplasm of breast: Secondary | ICD-10-CM

## 2017-08-05 ENCOUNTER — Other Ambulatory Visit: Payer: Self-pay

## 2017-08-05 ENCOUNTER — Encounter: Payer: Self-pay | Admitting: Student in an Organized Health Care Education/Training Program

## 2017-08-05 ENCOUNTER — Ambulatory Visit
Payer: Medicaid Other | Attending: Student in an Organized Health Care Education/Training Program | Admitting: Student in an Organized Health Care Education/Training Program

## 2017-08-05 VITALS — BP 137/93 | HR 95 | Temp 98.3°F | Resp 16 | Ht 59.0 in | Wt 123.0 lb

## 2017-08-05 DIAGNOSIS — Z882 Allergy status to sulfonamides status: Secondary | ICD-10-CM | POA: Insufficient documentation

## 2017-08-05 DIAGNOSIS — I700 Atherosclerosis of aorta: Secondary | ICD-10-CM | POA: Insufficient documentation

## 2017-08-05 DIAGNOSIS — F329 Major depressive disorder, single episode, unspecified: Secondary | ICD-10-CM | POA: Diagnosis not present

## 2017-08-05 DIAGNOSIS — Z90710 Acquired absence of both cervix and uterus: Secondary | ICD-10-CM | POA: Insufficient documentation

## 2017-08-05 DIAGNOSIS — M797 Fibromyalgia: Secondary | ICD-10-CM | POA: Insufficient documentation

## 2017-08-05 DIAGNOSIS — Z7984 Long term (current) use of oral hypoglycemic drugs: Secondary | ICD-10-CM | POA: Insufficient documentation

## 2017-08-05 DIAGNOSIS — Z79899 Other long term (current) drug therapy: Secondary | ICD-10-CM | POA: Diagnosis not present

## 2017-08-05 DIAGNOSIS — M5441 Lumbago with sciatica, right side: Secondary | ICD-10-CM

## 2017-08-05 DIAGNOSIS — G894 Chronic pain syndrome: Secondary | ICD-10-CM | POA: Diagnosis not present

## 2017-08-05 DIAGNOSIS — F1190 Opioid use, unspecified, uncomplicated: Secondary | ICD-10-CM

## 2017-08-05 DIAGNOSIS — G8929 Other chronic pain: Secondary | ICD-10-CM

## 2017-08-05 DIAGNOSIS — E785 Hyperlipidemia, unspecified: Secondary | ICD-10-CM | POA: Insufficient documentation

## 2017-08-05 DIAGNOSIS — Z833 Family history of diabetes mellitus: Secondary | ICD-10-CM | POA: Insufficient documentation

## 2017-08-05 DIAGNOSIS — Z888 Allergy status to other drugs, medicaments and biological substances status: Secondary | ICD-10-CM | POA: Insufficient documentation

## 2017-08-05 DIAGNOSIS — K219 Gastro-esophageal reflux disease without esophagitis: Secondary | ICD-10-CM | POA: Diagnosis not present

## 2017-08-05 DIAGNOSIS — Z79891 Long term (current) use of opiate analgesic: Secondary | ICD-10-CM | POA: Insufficient documentation

## 2017-08-05 DIAGNOSIS — M7918 Myalgia, other site: Secondary | ICD-10-CM | POA: Diagnosis not present

## 2017-08-05 DIAGNOSIS — Z7982 Long term (current) use of aspirin: Secondary | ICD-10-CM | POA: Diagnosis not present

## 2017-08-05 DIAGNOSIS — Z8249 Family history of ischemic heart disease and other diseases of the circulatory system: Secondary | ICD-10-CM | POA: Insufficient documentation

## 2017-08-05 DIAGNOSIS — M5416 Radiculopathy, lumbar region: Secondary | ICD-10-CM | POA: Diagnosis not present

## 2017-08-05 DIAGNOSIS — M5116 Intervertebral disc disorders with radiculopathy, lumbar region: Secondary | ICD-10-CM | POA: Diagnosis not present

## 2017-08-05 DIAGNOSIS — J449 Chronic obstructive pulmonary disease, unspecified: Secondary | ICD-10-CM | POA: Diagnosis not present

## 2017-08-05 DIAGNOSIS — F17210 Nicotine dependence, cigarettes, uncomplicated: Secondary | ICD-10-CM | POA: Diagnosis not present

## 2017-08-05 DIAGNOSIS — I10 Essential (primary) hypertension: Secondary | ICD-10-CM | POA: Insufficient documentation

## 2017-08-05 DIAGNOSIS — Z9889 Other specified postprocedural states: Secondary | ICD-10-CM | POA: Diagnosis not present

## 2017-08-05 DIAGNOSIS — M5442 Lumbago with sciatica, left side: Secondary | ICD-10-CM | POA: Diagnosis not present

## 2017-08-05 DIAGNOSIS — E119 Type 2 diabetes mellitus without complications: Secondary | ICD-10-CM | POA: Insufficient documentation

## 2017-08-05 DIAGNOSIS — Z5181 Encounter for therapeutic drug level monitoring: Secondary | ICD-10-CM | POA: Insufficient documentation

## 2017-08-05 MED ORDER — HYDROCODONE-ACETAMINOPHEN 5-325 MG PO TABS: 1.0000 | ORAL_TABLET | Freq: Three times a day (TID) | ORAL | 0 refills | Status: DC | PRN

## 2017-08-05 MED ORDER — MELOXICAM 15 MG PO TABS: 15.0000 mg | ORAL_TABLET | Freq: Every day | ORAL | 1 refills | Status: DC

## 2017-08-05 NOTE — Progress Notes (Signed)
Patient's Name: [name]  MRN: [id number]  Referring Provider: Sharyne Peach, MD  DOB: 19-Jun-1960  PCP: Sharyne Peach, MD  DOS: 08/05/2017  Note by: Gillis Santa, MD  Service setting: Ambulatory outpatient  Specialty: Interventional Pain Management  Location: [facility] (AMB) Pain Management Facility    Patient type: Established   Primary Reason(s) for Visit: Encounter for prescription drug management. (Level of risk: moderate)  CC: Back Pain (lower) and Leg Pain (bilaterally)  HPI  [name] is a 57 y.o. year old, female patient, who comes today for a medication management evaluation. She has Essential hypertension; Diabetes ([place]); Fibromyalgia; COPD (chronic obstructive pulmonary disease) ([place]); Diverticulitis; Lumbar radiculopathy; Chronic bilateral low back pain with bilateral sciatica; Chronic pain syndrome; Chronic, continuous use of opioids; and Chronic myofascial pain on their problem list. Her primarily concern today is the Back Pain (lower) and Leg Pain (bilaterally)  Pain Assessment: Location: Lower Back Radiating: denies; pt also c/o stabbing, aching leg pain bilaterally Onset: More than a month ago Duration: Chronic pain Quality: Constant Severity: 2 /10 (subjective, self-reported pain score)  Note: Reported level is compatible with observation.                         When using our objective Pain Scale, levels between 6 and 10/10 are said to belong in an emergency room, as it progressively worsens from a 6/10, described as severely limiting, requiring emergency care not usually available at an outpatient pain management facility. At a 6/10 level, communication becomes difficult and requires great effort. Assistance to reach the emergency department may be required. Facial flushing and profuse sweating along with potentially dangerous increases in heart rate and blood pressure will be evident. Effect on ADL:   Timing: Constant Modifying factors: medications,  rest BP: (!) 137/93  HR: 95  [name] was last scheduled for an appointment on 07/17/2017 for medication management. During today's appointment we reviewed [name]'s chronic pain status, as well as her outpatient medication regimen.  The patient  reports that she does not use drugs. Her body mass index is 24.84 kg/m.  Further details on both, my assessment(s), as well as the proposed treatment plan, please see below.  Controlled Substance Pharmacotherapy Assessment REMS (Risk Evaluation and Mitigation Strategy)  Analgesic: Hydrocodone 5 mg TID prn MME/day:15 mg/day.  Rise Patience, RN  08/05/2017 11:17 AM  Sign at close encounter Nursing Pain Medication Assessment:  Safety precautions to be maintained throughout the outpatient stay will include: orient to surroundings, keep bed in low position, maintain call bell within reach at all times, provide assistance with transfer out of bed and ambulation.  Medication Inspection Compliance: Pill count conducted under aseptic conditions, in front of the patient. Neither the pills nor the bottle was removed from the patient's sight at any time. Once count was completed pills were immediately returned to the patient in their original bottle.  Medication: Hydrocodone/APAP Pill/Patch Count: 10 of 90 pills remain Pill/Patch Appearance: Markings consistent with prescribed medication Bottle Appearance: Standard pharmacy container. Clearly labeled. Filled Date: 4 / 25 / 2019 Last Medication intake:  Today Pharmacokinetics: Liberation and absorption (onset of action): WNL Distribution (time to peak effect): WNL Metabolism and excretion (duration of action): WNL         Pharmacodynamics: Desired effects: Analgesia: [name] reports >50% benefit. Functional ability: Patient reports that medication allows her to accomplish basic ADLs Clinically meaningful improvement in function (CMIF): Sustained CMIF goals met  Perceived  effectiveness: Described as relatively effective, allowing for increase in activities of daily living (ADL) Undesirable effects: Side-effects or Adverse reactions: None reported Monitoring: [place] PMP: Online review of the past 25-monthperiod conducted. Compliant with practice rules and regulations Last UDS on record: Summary  Date Value Ref Range Status  07/14/2017 FINAL  Final    Comment:    ==================================================================== TOXASSURE SELECT 13 (MW) ==================================================================== Test                             Result       Flag       Units Drug Present and Declared for Prescription Verification   Hydrocodone                    1416         EXPECTED   ng/mg creat   Hydromorphone                  359          EXPECTED   ng/mg creat   Dihydrocodeine                 388          EXPECTED   ng/mg creat   Norhydrocodone                 1402         EXPECTED   ng/mg creat    Sources of hydrocodone include scheduled prescription    medications. Hydromorphone, dihydrocodeine and norhydrocodone are    expected metabolites of hydrocodone. Hydromorphone and    dihydrocodeine are also available as scheduled prescription    medications. ==================================================================== Test                      Result    Flag   Units      Ref Range   Creatinine              131              mg/dL      >=20 ==================================================================== Declared Medications:  The flagging and interpretation on this report are based on the  following declared medications.  Unexpected results may arise from  inaccuracies in the declared medications.  **Note: The testing scope of this panel includes these medications:  Hydrocodone (Hydrocodone-Acetaminophen)  **Note: The testing scope of this panel does not include following  reported medications:  Acetaminophen  (Hydrocodone-Acetaminophen)  Albuterol  Aspirin (Aspirin 81)  Citalopram  Cyclobenzaprine  Fluticasone  Gabapentin  Lisinopril  Meclizine  Meloxicam  Metformin  Mirtazapine  Nicotine  Pantoprazole  Polyethylene Glycol  Ranitidine  Rizatriptan  Rosuvastatin  Simethicone  Sucralfate  Topiramate  Trazodone ==================================================================== For clinical consultation, please call ([phone] ====================================================================    UDS interpretation: Compliant          Medication Assessment Form: Reviewed. Patient indicates being compliant with therapy Treatment compliance: Compliant Risk Assessment Profile: Aberrant behavior: See prior evaluations. None observed or detected today Comorbid factors increasing risk of overdose: See prior notes. No additional risks detected today Risk of substance use disorder (SUD): Low Opioid Risk Tool - 08/05/17 1111      Family History of Substance Abuse   Alcohol  Positive Female    Illegal Drugs  Negative    Rx Drugs  Negative      Personal History of Substance  Abuse   Alcohol  Positive Female or Female    Illegal Drugs  Positive Female or Female    Rx Drugs  Negative      Age   Age between 21-45 years   No      History of Preadolescent Sexual Abuse   History of Preadolescent Sexual Abuse  Negative or Female      Psychological Disease   Psychological Disease  Negative    Depression  Negative      Total Score   Opioid Risk Tool Scoring  8    Opioid Risk Interpretation  High Risk      ORT Scoring interpretation table:  Score <3 = Low Risk for SUD  Score between 4-7 = Moderate Risk for SUD  Score >8 = High Risk for Opioid Abuse   Risk Mitigation Strategies:  Patient Counseling: Covered Patient-Prescriber Agreement (PPA): Present and active  Notification to other healthcare providers: Done  Pharmacologic Plan: No change in therapy, at this time.              Laboratory Chemistry  Inflammation Markers (CRP: Acute Phase) (ESR: Chronic Phase) Lab Results  Component Value Date   LATICACIDVEN 1.6 10/14/2016                         Rheumatology Markers No results found for: Elayne Guerin, [facility]                      Renal Function Markers Lab Results  Component Value Date   BUN 14 10/15/2016   CREATININE 0.60 06/26/2017   GFRAA >60 10/15/2016   GFRNONAA >60 10/15/2016                              Hepatic Function Markers Lab Results  Component Value Date   AST 16 10/14/2016   ALT 16 10/14/2016   ALBUMIN 4.5 10/14/2016   ALKPHOS 66 10/14/2016   LIPASE 24 10/14/2016                        Electrolytes Lab Results  Component Value Date   NA 138 10/15/2016   K 4.4 10/15/2016   CL 107 10/15/2016   CALCIUM 8.3 (L) 10/15/2016                        Neuropathy Markers Lab Results  Component Value Date   HGBA1C 10.4 (H) 05/02/2016   HIV Non Reactive 10/15/2016                        Bone Pathology Markers No results found for: [facility], [id number], [id number], [id number], 25OHVITD1, 25OHVITD2, 25OHVITD3, TESTOFREE, TESTOSTERONE                       Coagulation Parameters Lab Results  Component Value Date   PLT 182 10/15/2016                        Cardiovascular Markers Lab Results  Component Value Date   TROPONINI <0.03 10/16/2016   HGB 12.1 10/15/2016   HCT 37.1 10/15/2016                         CA Markers No results found for:  CEA, CA125, LABCA2                      Note: Lab results reviewed.  Recent Diagnostic Imaging Results  CT ABDOMEN PELVIS W CONTRAST CLINICAL DATA:  Left-sided abdominal pain despite antibiotic treatment  EXAM: CT ABDOMEN AND PELVIS WITH CONTRAST  TECHNIQUE: Multidetector CT imaging of the abdomen and pelvis was performed using the standard protocol following bolus administration of intravenous contrast.  CONTRAST:  119m ISOVUE-300 IOPAMIDOL  (ISOVUE-300) INJECTION 61%  COMPARISON:  CT abdomen pelvis of 10/14/2016  FINDINGS: Lower chest: The lung bases are clear. Cardiomegaly is stable. No pericardial effusion is seen.  Hepatobiliary: The liver enhances with no focal abnormality and no ductal dilatation is seen. The left lobe of liver is somewhat prominent but stable compared to the prior CT. A small single gallstone within the gallbladder cannot be excluded.  Pancreas: The pancreas is normal in size and the pancreatic duct is not dilated.  Spleen: The spleen is unremarkable.  Adrenals/Urinary Tract: The adrenal glands appear normal. The kidneys enhance with no calculus or mass and on delayed images, no hydronephrosis is seen. The ureters are normal in caliber. The urinary bladder is moderately well distended with no abnormality noted.  Stomach/Bowel: The stomach is moderately distended with oral contrast and food debris. No abnormality of small bowel is seen. There are a few scattered rectosigmoid and descending colon diverticula present with a few scattered throughout the more proximal colon as well. A moderate to large amount of feces is noted throughout the entire colon. The terminal ileum is well opacified with no abnormality. There is no present evidence of diverticulitis.  Vascular/Lymphatic: The abdominal aorta is normal in caliber with moderate abdominal aortic atherosclerosis present. No adenopathy is seen.  Reproductive: The uterus has previously been resected. No adnexal lesion is seen. No fluid is noted within the pelvis.  Other: No abdominal wall hernia is seen.  Musculoskeletal: The lumbar vertebrae are in normal alignment with normal intervertebral disc spaces. The SI joints are corticated.  IMPRESSION: 1. Although there are scattered colonic diverticula, there is no present evidence of diverticulitis. The appendix and terminal ileum also are unremarkable. 2. There is a moderately large  amount of feces throughout the colon. 3. Cannot exclude a single small gallstone within the gallbladder. 4. Moderate abdominal aortic atherosclerosis.  Electronically Signed   By: PIvar DrapeM.D.   On: 06/26/2017 14:14  Complexity Note: Imaging results reviewed. Results shared with [name], using Layman's terms.                         Meds   Current Outpatient Medications:  .  albuterol (PROVENTIL HFA;VENTOLIN HFA) 108 (90 Base) MCG/ACT inhaler, Inhale 1 puff into the lungs 2 (two) times daily as needed., Disp: , Rfl:  .  aspirin 81 MG tablet, Take 81 mg by mouth daily. , Disp: , Rfl:  .  citalopram (CELEXA) 40 MG tablet, Take 40 mg by mouth daily., Disp: , Rfl:  .  cyclobenzaprine (FLEXERIL) 10 MG tablet, Take 1 tablet (10 mg total) by mouth 3 (three) times daily., Disp: 30 tablet, Rfl: 0 .  fluticasone (FLONASE) 50 MCG/ACT nasal spray, Place 1 spray into both nostrils daily., Disp: , Rfl:  .  [START ON 08/15/2017] HYDROcodone-acetaminophen (NORCO/VICODIN) 5-325 MG tablet, Take 1 tablet by mouth 3 (three) times daily as needed. For chronic pain To last for 30 days from fill date,  Disp: 90 tablet, Rfl: 0 .  lisinopril (PRINIVIL,ZESTRIL) 10 MG tablet, Take 10 mg by mouth daily. , Disp: , Rfl:  .  meclizine (ANTIVERT) 25 MG tablet, Take 25 mg by mouth 3 (three) times daily as needed for dizziness., Disp: , Rfl:  .  meloxicam (MOBIC) 15 MG tablet, Take 1 tablet (15 mg total) by mouth daily., Disp: 30 tablet, Rfl: 1 .  metFORMIN (GLUCOPHAGE) 1000 MG tablet, Take 1 tablet by mouth 2 (two) times daily., Disp: , Rfl:  .  mirtazapine (REMERON) 15 MG tablet, Take 15 mg by mouth at bedtime., Disp: , Rfl:  .  pantoprazole (PROTONIX) 40 MG tablet, Take 40 mg by mouth daily., Disp: , Rfl:  .  polyethylene glycol (MIRALAX / GLYCOLAX) packet, Take 17 g by mouth daily., Disp: , Rfl:  .  ranitidine (ZANTAC) 300 MG tablet, Take 300 mg by mouth at bedtime., Disp: , Rfl:  .  rizatriptan (MAXALT) 10  MG tablet, Take 10 mg by mouth as needed for migraine. May repeat in 2 hours if needed, Disp: , Rfl:  .  rosuvastatin (CRESTOR) 20 MG tablet, Take 20 mg by mouth daily., Disp: , Rfl:  .  simethicone (MYLICON) 80 MG chewable tablet, Chew 80 mg by mouth daily., Disp: , Rfl:  .  sucralfate (CARAFATE) 1 g tablet, Take 1 g by mouth 4 (four) times daily -  with meals and at bedtime., Disp: , Rfl:  .  topiramate (TOPAMAX) 25 MG tablet, Take 25 mg by mouth 2 (two) times daily., Disp: , Rfl:  .  traZODone (DESYREL) 100 MG tablet, Take 1 tablet by mouth at bedtime as needed., Disp: , Rfl:  .  nicotine (NICODERM CQ - DOSED IN MG/24 HOURS) 21 mg/24hr patch, Place 21 mg onto the skin daily., Disp: , Rfl:   ROS  Constitutional: Denies any fever or chills Gastrointestinal: No reported hemesis, hematochezia, vomiting, or acute GI distress Musculoskeletal: Denies any acute onset joint swelling, redness, loss of ROM, or weakness Neurological: No reported episodes of acute onset apraxia, aphasia, dysarthria, agnosia, amnesia, paralysis, loss of coordination, or loss of consciousness  Allergies  [name] is allergic to tramadol; benadryl [diphenhydramine hcl]; bextra [valdecoxib]; invokana [canagliflozin]; and sulfamethoxazole-trimethoprim.  PFSH  Drug: [name]  reports that she does not use drugs. Alcohol:  reports that she does not drink alcohol. Tobacco:  reports that she has been smoking cigarettes.  She has been smoking about 0.50 packs per day. She uses smokeless tobacco. Medical:  has a past medical history of Asthma, Asthma, BPPV (benign paroxysmal positional vertigo), Degenerative disc disease, lumbar, Depression, Diabetes mellitus, Fatty liver, Fibromyalgia, Fibromyalgia, GERD (gastroesophageal reflux disease), History of degenerative disc disease, Hyperlipidemia, and Hypertension. Surgical: [name]  has a past surgical history that includes Abdominal surgery; Abdominal  hysterectomy; Esophagogastroduodenoscopy (egd) with propofol (N/A, 02/20/2017); Colonoscopy with propofol (N/A, 02/20/2017); Upper esophageal endoscopic ultrasound (eus) (N/A, 04/03/2017); and Diverticulitis. Family: family history includes Diabetes in her mother; Hypertension in her mother.  Constitutional Exam  General appearance: Well nourished, well developed, and well hydrated. In no apparent acute distress Vitals:   08/05/17 1057  BP: (!) 137/93  Pulse: 95  Resp: 16  Temp: 98.3 F (36.8 C)  TempSrc: Oral  SpO2: 98%  Weight: 123 lb (55.8 kg)  Height: _0  (1.499 m)   BMI Assessment: Estimated body mass index is 24.84 kg/m as calculated from the following:   Height as of this encounter: _1  (1.499 m).  Weight as of this encounter: 123 lb (55.8 kg).  BMI interpretation table: BMI level Category Range association with higher incidence of chronic pain  <18 kg/m2 Underweight   18.5-24.9 kg/m2 Ideal body weight   25-29.9 kg/m2 Overweight Increased incidence by 20%  30-34.9 kg/m2 Obese (Class I) Increased incidence by 68%  35-39.9 kg/m2 Severe obesity (Class II) Increased incidence by 136%  >40 kg/m2 Extreme obesity (Class III) Increased incidence by 254%   Patient's current BMI Ideal Body weight  Body mass index is 24.84 kg/m. Patient must be at least 60 in tall to calculate ideal body weight   BMI Readings from Last 4 Encounters:  08/05/17 24.84 kg/m  07/14/17 25.45 kg/m  06/25/17 24.24 kg/m  06/16/17 24.24 kg/m   Wt Readings from Last 4 Encounters:  08/05/17 123 lb (55.8 kg)  07/14/17 126 lb (57.2 kg)  06/25/17 120 lb (54.4 kg)  06/16/17 120 lb (54.4 kg)  Psych/Mental status: Alert, oriented x 3 (person, place, & time)       Eyes: PERLA Respiratory: No evidence of acute respiratory distress  Cervical Spine Area Exam  Skin & Axial Inspection: No masses, redness, edema, swelling, or associated skin lesions Alignment: Symmetrical Functional ROM:  Unrestricted ROM      Stability: No instability detected Muscle Tone/Strength: Functionally intact. No obvious neuro-muscular anomalies detected. Sensory (Neurological): Unimpaired Palpation: No palpable anomalies              Upper Extremity (UE) Exam    Side: Right upper extremity  Side: Left upper extremity  Skin & Extremity Inspection: Skin color, temperature, and hair growth are WNL. No peripheral edema or cyanosis. No masses, redness, swelling, asymmetry, or associated skin lesions. No contractures.  Skin & Extremity Inspection: Skin color, temperature, and hair growth are WNL. No peripheral edema or cyanosis. No masses, redness, swelling, asymmetry, or associated skin lesions. No contractures.  Functional ROM: Unrestricted ROM          Functional ROM: Unrestricted ROM          Muscle Tone/Strength: Functionally intact. No obvious neuro-muscular anomalies detected.  Muscle Tone/Strength: Functionally intact. No obvious neuro-muscular anomalies detected.  Sensory (Neurological): Unimpaired          Sensory (Neurological): Unimpaired          Palpation: No palpable anomalies              Palpation: No palpable anomalies              Specialized Test(s): Deferred         Specialized Test(s): Deferred          Thoracic Spine Area Exam  Skin & Axial Inspection: No masses, redness, or swelling Alignment: Symmetrical Functional ROM: Unrestricted ROM Stability: No instability detected Muscle Tone/Strength: Functionally intact. No obvious neuro-muscular anomalies detected. Sensory (Neurological): Unimpaired Muscle strength & Tone: No palpable anomalies  Lumbar Spine Area Exam  Skin & Axial Inspection: No masses, redness, or swelling Alignment: Symmetrical Functional ROM: Unrestricted ROM       Stability: No instability detected Muscle Tone/Strength: Functionally intact. No obvious neuro-muscular anomalies detected. Sensory (Neurological): Unimpaired Palpation: No palpable anomalies        Provocative Tests: Lumbar Hyperextension and rotation test: evaluation deferred today       Lumbar Lateral bending test: evaluation deferred today       Patrick's Maneuver: evaluation deferred today  Gait & Posture Assessment  Ambulation: Unassisted Gait: Relatively normal for age and body habitus Posture: WNL   Lower Extremity Exam    Side: Right lower extremity  Side: Left lower extremity  Stability: No instability observed          Stability: No instability observed          Skin & Extremity Inspection: Skin color, temperature, and hair growth are WNL. No peripheral edema or cyanosis. No masses, redness, swelling, asymmetry, or associated skin lesions. No contractures.  Skin & Extremity Inspection: Skin color, temperature, and hair growth are WNL. No peripheral edema or cyanosis. No masses, redness, swelling, asymmetry, or associated skin lesions. No contractures.  Functional ROM: Unrestricted ROM                  Functional ROM: Unrestricted ROM                  Muscle Tone/Strength: Functionally intact. No obvious neuro-muscular anomalies detected.  Muscle Tone/Strength: Functionally intact. No obvious neuro-muscular anomalies detected.  Sensory (Neurological): Unimpaired  Sensory (Neurological): Unimpaired  Palpation: No palpable anomalies  Palpation: No palpable anomalies   Assessment  Primary Diagnosis & Pertinent Problem List: The primary encounter diagnosis was Lumbar radiculopathy. Diagnoses of Chronic bilateral low back pain with bilateral sciatica, Chronic pain syndrome, Chronic myofascial pain, and Chronic, continuous use of opioids were also pertinent to this visit.  Status Diagnosis  Controlled Controlled Controlled 1. Lumbar radiculopathy   2. Chronic bilateral low back pain with bilateral sciatica   3. Chronic pain syndrome   4. Chronic myofascial pain   5. Chronic, continuous use of opioids      General Recommendations: The pain condition that  the patient suffers from is best treated with a multidisciplinary approach that involves an increase in physical activity to prevent de-conditioning and worsening of the pain cycle, as well as psychological counseling (formal and/or informal) to address the co-morbid psychological affects of pain. Treatment will often involve judicious use of pain medications and interventional procedures to decrease the pain, allowing the patient to participate in the physical activity that will ultimately produce long-lasting pain reductions. The goal of the multidisciplinary approach is to return the patient to a higher level of overall function and to restore their ability to perform activities of daily living.  57 year old female who presents with a chief complaint of axial low back pain that radiates down her bilateral buttocks, lateral hips and then to her calves bilaterally. This is secondary to lumbar radiculopathy, lumbar degenerative disc disease.  Patient is tried lumbar epidural steroid injection with me on 06/16/2017 which was not effective for her radicular symptoms.  In regards to medication management, she has tried gabapentin and Lyrica in the past which were not effective.  Patient returns today for medication refill of hydrocodone 5 mill grams 3 times daily as needed, quantity 20-month  We will also refill her Mobic 15 mg daily. 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  Plan: -Refill of hydrocodone 5 mg 3 times daily as needed for 1 month. Given prior history of cocaine and alcohol abuse, monthly fills only and low threshold to obtain UDS.  No further dose escalation beyond hydrocodone 5 mg 3 times daily as needed. -Continue Flexeril 10 g 3 times daily as needed -Continue Topamax 25 mg twice daily -Refill of Mobic 15 mg daily -Follow-up in 1 month  Plan of Care  Pharmacotherapy (Medications Ordered): Meds ordered this  encounter  Medications  . meloxicam (MOBIC) 15 MG tablet    Sig: Take 1 tablet (15 mg total) by mouth daily.    Dispense:  30 tablet    Refill:  1  . HYDROcodone-acetaminophen (NORCO/VICODIN) 5-325 MG tablet    Sig: Take 1 tablet by mouth 3 (three) times daily as needed. For chronic pain To last for 30 days from fill date    Dispense:  90 tablet    Refill:  0   Time Note: Greater than 50% of the 25 minute(s) of face-to-face time spent with [name], was spent in counseling/coordination of care regarding: the appropriate use of the pain scale, [name]'s primary cause of pain, the treatment plan, medication side effects, the opioid analgesic risks and possible complications, the appropriate use of her medications, realistic expectations, the medication agreement and the patient's responsibilities when it comes to controlled substances.  Provider-requested follow-up: Return in about 1 month (around 09/02/2017) for MM with Crystal.  Future Appointments  Date Time Provider [place]  09/01/2017  9:45 AM Vevelyn Francois, NP [facility] None    Primary Care Physician: Sharyne Peach, MD Location: [place] [facility] Note by: Gillis Santa, M.D Date: 08/05/2017; Time: 1:27 PM  Patient Instructions  Rx for Meloxicam has been escribed to your pharmacy. You have been given Rx for hydrocodone.

## 2017-08-05 NOTE — Patient Instructions (Signed)
Rx for Meloxicam has been escribed to your pharmacy. You have been given Rx for hydrocodone.

## 2017-08-05 NOTE — Progress Notes (Signed)
Nursing Pain Medication Assessment:  Safety precautions to be maintained throughout the outpatient stay will include: orient to surroundings, keep bed in low position, maintain call bell within reach at all times, provide assistance with transfer out of bed and ambulation.  Medication Inspection Compliance: Pill count conducted under aseptic conditions, in front of the patient. Neither the pills nor the bottle was removed from the patient's sight at any time. Once count was completed pills were immediately returned to the patient in their original bottle.  Medication: Hydrocodone/APAP Pill/Patch Count: 10 of 90 pills remain Pill/Patch Appearance: Markings consistent with prescribed medication Bottle Appearance: Standard pharmacy container. Clearly labeled. Filled Date: 4 / 25 / 2019 Last Medication intake:  Today

## 2017-09-01 ENCOUNTER — Encounter: Payer: Self-pay | Admitting: Nurse Practitioner

## 2017-09-01 ENCOUNTER — Ambulatory Visit: Payer: Medicaid Other | Attending: Nurse Practitioner | Admitting: Nurse Practitioner

## 2017-09-01 ENCOUNTER — Other Ambulatory Visit: Payer: Self-pay

## 2017-09-01 VITALS — BP 149/103 | HR 97 | Temp 98.4°F | Ht 59.0 in | Wt 123.0 lb

## 2017-09-01 DIAGNOSIS — R011 Cardiac murmur, unspecified: Secondary | ICD-10-CM | POA: Diagnosis not present

## 2017-09-01 DIAGNOSIS — K760 Fatty (change of) liver, not elsewhere classified: Secondary | ICD-10-CM | POA: Diagnosis not present

## 2017-09-01 DIAGNOSIS — H8110 Benign paroxysmal vertigo, unspecified ear: Secondary | ICD-10-CM | POA: Insufficient documentation

## 2017-09-01 DIAGNOSIS — M545 Low back pain: Secondary | ICD-10-CM | POA: Insufficient documentation

## 2017-09-01 DIAGNOSIS — I10 Essential (primary) hypertension: Secondary | ICD-10-CM | POA: Diagnosis not present

## 2017-09-01 DIAGNOSIS — I700 Atherosclerosis of aorta: Secondary | ICD-10-CM | POA: Insufficient documentation

## 2017-09-01 DIAGNOSIS — M5442 Lumbago with sciatica, left side: Secondary | ICD-10-CM

## 2017-09-01 DIAGNOSIS — G894 Chronic pain syndrome: Secondary | ICD-10-CM | POA: Diagnosis not present

## 2017-09-01 DIAGNOSIS — E782 Mixed hyperlipidemia: Secondary | ICD-10-CM | POA: Diagnosis not present

## 2017-09-01 DIAGNOSIS — F17210 Nicotine dependence, cigarettes, uncomplicated: Secondary | ICD-10-CM | POA: Diagnosis not present

## 2017-09-01 DIAGNOSIS — R109 Unspecified abdominal pain: Secondary | ICD-10-CM | POA: Insufficient documentation

## 2017-09-01 DIAGNOSIS — F329 Major depressive disorder, single episode, unspecified: Secondary | ICD-10-CM | POA: Insufficient documentation

## 2017-09-01 DIAGNOSIS — E119 Type 2 diabetes mellitus without complications: Secondary | ICD-10-CM | POA: Diagnosis not present

## 2017-09-01 DIAGNOSIS — K5792 Diverticulitis of intestine, part unspecified, without perforation or abscess without bleeding: Secondary | ICD-10-CM | POA: Diagnosis not present

## 2017-09-01 DIAGNOSIS — F17200 Nicotine dependence, unspecified, uncomplicated: Secondary | ICD-10-CM | POA: Insufficient documentation

## 2017-09-01 DIAGNOSIS — M7918 Myalgia, other site: Secondary | ICD-10-CM | POA: Insufficient documentation

## 2017-09-01 DIAGNOSIS — Z79899 Other long term (current) drug therapy: Secondary | ICD-10-CM | POA: Diagnosis not present

## 2017-09-01 DIAGNOSIS — M797 Fibromyalgia: Secondary | ICD-10-CM | POA: Insufficient documentation

## 2017-09-01 DIAGNOSIS — G8929 Other chronic pain: Secondary | ICD-10-CM | POA: Diagnosis not present

## 2017-09-01 DIAGNOSIS — Z882 Allergy status to sulfonamides status: Secondary | ICD-10-CM | POA: Insufficient documentation

## 2017-09-01 DIAGNOSIS — Z7984 Long term (current) use of oral hypoglycemic drugs: Secondary | ICD-10-CM | POA: Insufficient documentation

## 2017-09-01 DIAGNOSIS — M5441 Lumbago with sciatica, right side: Secondary | ICD-10-CM

## 2017-09-01 DIAGNOSIS — M5116 Intervertebral disc disorders with radiculopathy, lumbar region: Secondary | ICD-10-CM | POA: Diagnosis not present

## 2017-09-01 DIAGNOSIS — J449 Chronic obstructive pulmonary disease, unspecified: Secondary | ICD-10-CM | POA: Insufficient documentation

## 2017-09-01 DIAGNOSIS — Z7982 Long term (current) use of aspirin: Secondary | ICD-10-CM | POA: Diagnosis not present

## 2017-09-01 DIAGNOSIS — F32A Depression, unspecified: Secondary | ICD-10-CM | POA: Insufficient documentation

## 2017-09-01 DIAGNOSIS — Z888 Allergy status to other drugs, medicaments and biological substances status: Secondary | ICD-10-CM | POA: Diagnosis not present

## 2017-09-01 DIAGNOSIS — M160 Bilateral primary osteoarthritis of hip: Secondary | ICD-10-CM | POA: Diagnosis not present

## 2017-09-01 MED ORDER — HYDROCODONE-ACETAMINOPHEN 5-325 MG PO TABS: 1.0000 | ORAL_TABLET | Freq: Three times a day (TID) | ORAL | 0 refills | Status: DC | PRN

## 2017-09-01 NOTE — Progress Notes (Signed)
Patient's Name: [name]  MRN: [id number]  Referring Provider: Sharyne Peach, MD  DOB: October 24, 1960  PCP: Sharyne Peach, MD  DOS: 09/01/2017  Note by: Vevelyn Francois NP  Service setting: Ambulatory outpatient  Specialty: Interventional Pain Management  Location: [facility] (AMB) Pain Management Facility    Patient type: Established    Primary Reason(s) for Visit: Encounter for prescription drug management. (Level of risk: moderate)  CC: Back Pain (lower)  HPI  [name] is a 57 y.o. year old, female patient, who comes today for a medication management evaluation. She has Essential hypertension; Diabetes ([place]); Fibromyalgia; COPD (chronic obstructive pulmonary disease) ([place]); Diverticulitis; Lumbar radiculopathy; Chronic bilateral low back pain with bilateral sciatica; Chronic pain syndrome; Chronic, continuous use of opioids; Chronic myofascial pain; Benign paroxysmal positional vertigo; Cardiac murmur, unspecified; DDD (degenerative disc disease), lumbar; Depression; Fatty liver disease, nonalcoholic; Primary osteoarthritis of both hips; Hyperlipidemia, mixed; Smoker; and Type 2 diabetes mellitus without complications ([place]) on their problem list. Her primarily concern today is the Back Pain (lower)  Pain Assessment: Location: Lower Back Radiating: radiates down both leg Onset: More than a month ago Duration: Chronic pain Quality: Constant, Aching, Burning, Nagging, Discomfort Severity: 4 /10 (subjective, self-reported pain score)  Note: Reported level is compatible with observation.                          Effect on ADL: prolonged walking Timing: Constant Modifying factors: Medications, lay down BP: (!) 149/103  HR: 97  [name] was last scheduled for an appointment on Visit date not found for medication management. During today's appointment we reviewed [name]'s chronic pain status, as well as her outpatient medication regimen. She has low back pain that  goes down her legs into her calves. She cramping pain in her legs. She has some weakness but denies any falls.   The patient  reports that she does not use drugs. Her body mass index is 24.84 kg/m.  Further details on both, my assessment(s), as well as the proposed treatment plan, please see below.  Controlled Substance Pharmacotherapy Assessment REMS (Risk Evaluation and Mitigation Strategy)  Analgesic: Hydrocodone 5 mg TID prn MME/day:15 mg/day.    Chauncey Fischer, RN  09/01/2017 10:43 AM  Sign at close encounter Nursing Pain Medication Assessment:  Safety precautions to be maintained throughout the outpatient stay will include: orient to surroundings, keep bed in low position, maintain call bell within reach at all times, provide assistance with transfer out of bed and ambulation.  Medication Inspection Compliance: Pill count conducted under aseptic conditions, in front of the patient. Neither the pills nor the bottle was removed from the patient's sight at any time. Once count was completed pills were immediately returned to the patient in their original bottle.  Medication: Hydrocodone/APAP Pill/Patch Count: 22 of 90 pills remain Pill/Patch Appearance: Markings consistent with prescribed medication Bottle Appearance: Standard pharmacy container. Clearly labeled. Filled Date: 5 / 23 / 2019 Last Medication intake:  Today   Pharmacokinetics: Liberation and absorption (onset of action): WNL Distribution (time to peak effect): WNL Metabolism and excretion (duration of action): WNL         Pharmacodynamics: Desired effects: Analgesia: [name] reports >50% benefit. Functional ability: Patient reports that medication allows her to accomplish basic ADLs Clinically meaningful improvement in function (CMIF): Sustained CMIF goals met Perceived effectiveness: Described as relatively effective, allowing for increase in activities of daily living (ADL) Undesirable  effects:  Side-effects or Adverse reactions: None reported Monitoring: [place] PMP: Online review of the past 42-monthperiod conducted. Compliant with practice rules and regulations Last UDS on record: Summary  Date Value Ref Range Status  07/14/2017 FINAL  Final    Comment:    ==================================================================== TOXASSURE SELECT 13 (MW) ==================================================================== Test                             Result       Flag       Units Drug Present and Declared for Prescription Verification   Hydrocodone                    1416         EXPECTED   ng/mg creat   Hydromorphone                  359          EXPECTED   ng/mg creat   Dihydrocodeine                 388          EXPECTED   ng/mg creat   Norhydrocodone                 1402         EXPECTED   ng/mg creat    Sources of hydrocodone include scheduled prescription    medications. Hydromorphone, dihydrocodeine and norhydrocodone are    expected metabolites of hydrocodone. Hydromorphone and    dihydrocodeine are also available as scheduled prescription    medications. ==================================================================== Test                      Result    Flag   Units      Ref Range   Creatinine              131              mg/dL      >=20 ==================================================================== Declared Medications:  The flagging and interpretation on this report are based on the  following declared medications.  Unexpected results may arise from  inaccuracies in the declared medications.  **Note: The testing scope of this panel includes these medications:  Hydrocodone (Hydrocodone-Acetaminophen)  **Note: The testing scope of this panel does not include following  reported medications:  Acetaminophen (Hydrocodone-Acetaminophen)  Albuterol  Aspirin (Aspirin 81)  Citalopram  Cyclobenzaprine  Fluticasone  Gabapentin  Lisinopril   Meclizine  Meloxicam  Metformin  Mirtazapine  Nicotine  Pantoprazole  Polyethylene Glycol  Ranitidine  Rizatriptan  Rosuvastatin  Simethicone  Sucralfate  Topiramate  Trazodone ==================================================================== For clinical consultation, please call ([phone] ====================================================================    UDS interpretation: Compliant          Medication Assessment Form: Reviewed. Patient indicates being compliant with therapy Treatment compliance: Compliant Risk Assessment Profile: Aberrant behavior: See prior evaluations. None observed or detected today Comorbid factors increasing risk of overdose: See prior notes. No additional risks detected today Risk of substance use disorder (SUD): Low Opioid Risk Tool - 08/05/17 1111      Family History of Substance Abuse   Alcohol  Positive Female    Illegal Drugs  Negative    Rx Drugs  Negative      Personal History of Substance Abuse   Alcohol  Positive Female or Female    Illegal Drugs  Positive Female or  Female    Rx Drugs  Negative      Age   Age between 42-45 years   No      History of Preadolescent Sexual Abuse   History of Preadolescent Sexual Abuse  Negative or Female      Psychological Disease   Psychological Disease  Negative    Depression  Negative      Total Score   Opioid Risk Tool Scoring  8    Opioid Risk Interpretation  High Risk      ORT Scoring interpretation table:  Score <3 = Low Risk for SUD  Score between 4-7 = Moderate Risk for SUD  Score >8 = High Risk for Opioid Abuse   Risk Mitigation Strategies:  Patient Counseling: Covered Patient-Prescriber Agreement (PPA): Present and active  Notification to other healthcare providers: Done  Pharmacologic Plan: No change in therapy, at this time.             Laboratory Chemistry  Inflammation Markers (CRP: Acute Phase) (ESR: Chronic Phase) Lab Results  Component Value Date    LATICACIDVEN 1.6 10/14/2016                         Rheumatology Markers No results found for: RF, ANA, LABURIC, URICUR, LYMEIGGIGMAB, LYMEABIGMQN, HLAB27                      Renal Function Markers Lab Results  Component Value Date   BUN 14 10/15/2016   CREATININE 0.60 06/26/2017   BCR 13 05/02/2016   GFRAA >60 10/15/2016   GFRNONAA >60 10/15/2016                             Hepatic Function Markers Lab Results  Component Value Date   AST 16 10/14/2016   ALT 16 10/14/2016   ALBUMIN 4.5 10/14/2016   ALKPHOS 66 10/14/2016   LIPASE 24 10/14/2016                        Electrolytes Lab Results  Component Value Date   NA 138 10/15/2016   K 4.4 10/15/2016   CL 107 10/15/2016   CALCIUM 8.3 (L) 10/15/2016                        Neuropathy Markers Lab Results  Component Value Date   HGBA1C 10.4 (H) 05/02/2016   HIV Non Reactive 10/15/2016                        Bone Pathology Markers No results found for: [place], [id number], [id number], [id number], 25OHVITD1, 25OHVITD2, 25OHVITD3, TESTOFREE, TESTOSTERONE                       Coagulation Parameters Lab Results  Component Value Date   PLT 182 10/15/2016                        Cardiovascular Markers Lab Results  Component Value Date   TROPONINI <0.03 10/16/2016   HGB 12.1 10/15/2016   HCT 37.1 10/15/2016                         CA Markers No results found for: CEA, CA125, LABCA2  Note: Lab results reviewed.  Recent Diagnostic Imaging Results  CT ABDOMEN PELVIS W CONTRAST CLINICAL DATA:  Left-sided abdominal pain despite antibiotic treatment  EXAM: CT ABDOMEN AND PELVIS WITH CONTRAST  TECHNIQUE: Multidetector CT imaging of the abdomen and pelvis was performed using the standard protocol following bolus administration of intravenous contrast.  CONTRAST:  178m ISOVUE-300 IOPAMIDOL (ISOVUE-300) INJECTION 61%  COMPARISON:  CT abdomen pelvis of 10/14/2016  FINDINGS: Lower chest:  The lung bases are clear. Cardiomegaly is stable. No pericardial effusion is seen.  Hepatobiliary: The liver enhances with no focal abnormality and no ductal dilatation is seen. The left lobe of liver is somewhat prominent but stable compared to the prior CT. A small single gallstone within the gallbladder cannot be excluded.  Pancreas: The pancreas is normal in size and the pancreatic duct is not dilated.  Spleen: The spleen is unremarkable.  Adrenals/Urinary Tract: The adrenal glands appear normal. The kidneys enhance with no calculus or mass and on delayed images, no hydronephrosis is seen. The ureters are normal in caliber. The urinary bladder is moderately well distended with no abnormality noted.  Stomach/Bowel: The stomach is moderately distended with oral contrast and food debris. No abnormality of small bowel is seen. There are a few scattered rectosigmoid and descending colon diverticula present with a few scattered throughout the more proximal colon as well. A moderate to large amount of feces is noted throughout the entire colon. The terminal ileum is well opacified with no abnormality. There is no present evidence of diverticulitis.  Vascular/Lymphatic: The abdominal aorta is normal in caliber with moderate abdominal aortic atherosclerosis present. No adenopathy is seen.  Reproductive: The uterus has previously been resected. No adnexal lesion is seen. No fluid is noted within the pelvis.  Other: No abdominal wall hernia is seen.  Musculoskeletal: The lumbar vertebrae are in normal alignment with normal intervertebral disc spaces. The SI joints are corticated.  IMPRESSION: 1. Although there are scattered colonic diverticula, there is no present evidence of diverticulitis. The appendix and terminal ileum also are unremarkable. 2. There is a moderately large amount of feces throughout the colon. 3. Cannot exclude a single small gallstone within the  gallbladder. 4. Moderate abdominal aortic atherosclerosis.  Electronically Signed   By: PIvar DrapeM.D.   On: 06/26/2017 14:14  Complexity Note: Imaging results reviewed. Results shared with [name], using Layman's terms.                         Meds   Current Outpatient Medications:  .  albuterol (PROVENTIL HFA;VENTOLIN HFA) 108 (90 Base) MCG/ACT inhaler, Inhale 1 puff into the lungs 2 (two) times daily as needed., Disp: , Rfl:  .  aspirin 81 MG tablet, Take 81 mg by mouth daily. , Disp: , Rfl:  .  cyclobenzaprine (FLEXERIL) 10 MG tablet, Take 1 tablet (10 mg total) by mouth 3 (three) times daily., Disp: 30 tablet, Rfl: 0 .  fluticasone (FLONASE) 50 MCG/ACT nasal spray, Place 1 spray into both nostrils daily., Disp: , Rfl:  .  HYDROcodone-acetaminophen (NORCO/VICODIN) 5-325 MG tablet, Take 1 tablet by mouth 3 (three) times daily as needed. For chronic pain To last for 30 days from fill date, Disp: 90 tablet, Rfl: 0 .  lisinopril (PRINIVIL,ZESTRIL) 10 MG tablet, Take 10 mg by mouth daily. , Disp: , Rfl:  .  metFORMIN (GLUCOPHAGE) 1000 MG tablet, Take 1 tablet by mouth 2 (two) times daily., Disp: , Rfl:  .  mirtazapine (REMERON) 15 MG tablet, Take 15 mg by mouth at bedtime., Disp: , Rfl:  .  pantoprazole (PROTONIX) 40 MG tablet, Take 40 mg by mouth daily., Disp: , Rfl:  .  polyethylene glycol (MIRALAX / GLYCOLAX) packet, Take 17 g by mouth daily., Disp: , Rfl:  .  ranitidine (ZANTAC) 300 MG tablet, Take 300 mg by mouth at bedtime., Disp: , Rfl:  .  rizatriptan (MAXALT) 10 MG tablet, Take 10 mg by mouth as needed for migraine. May repeat in 2 hours if needed, Disp: , Rfl:  .  rosuvastatin (CRESTOR) 20 MG tablet, Take 20 mg by mouth daily., Disp: , Rfl:  .  sucralfate (CARAFATE) 1 g tablet, Take 1 g by mouth 4 (four) times daily -  with meals and at bedtime., Disp: , Rfl:  .  topiramate (TOPAMAX) 25 MG tablet, Take 25 mg by mouth 2 (two) times daily., Disp: , Rfl:  .  traZODone  (DESYREL) 100 MG tablet, Take 1 tablet by mouth at bedtime as needed., Disp: , Rfl:   ROS  Constitutional: Denies any fever or chills Gastrointestinal: No reported hemesis, hematochezia, vomiting, or acute GI distress Musculoskeletal: Denies any acute onset joint swelling, redness, loss of ROM, or weakness Neurological: No reported episodes of acute onset apraxia, aphasia, dysarthria, agnosia, amnesia, paralysis, loss of coordination, or loss of consciousness  Allergies  Ms. Mells is allergic to tramadol; benadryl [diphenhydramine hcl]; bextra [valdecoxib]; invokana [canagliflozin]; and sulfamethoxazole-trimethoprim.  PFSH  Drug: [name]  reports that she does not use drugs. Alcohol:  reports that she does not drink alcohol. Tobacco:  reports that she has been smoking cigarettes.  She has been smoking about 0.50 packs per day. She uses smokeless tobacco. Medical:  has a past medical history of Asthma, Asthma, BPPV (benign paroxysmal positional vertigo), Cardiac murmur, unspecified (08/28/2016), Degenerative disc disease, lumbar, Depression, Diabetes mellitus, Fatty liver, Fibromyalgia, Fibromyalgia, GERD (gastroesophageal reflux disease), History of degenerative disc disease, Hyperlipidemia, and Hypertension. Surgical: [name]  has a past surgical history that includes Abdominal surgery; Abdominal hysterectomy; Esophagogastroduodenoscopy (egd) with propofol (N/A, 02/20/2017); Colonoscopy with propofol (N/A, 02/20/2017); Upper esophageal endoscopic ultrasound (eus) (N/A, 04/03/2017); and Diverticulitis. Family: family history includes Diabetes in her mother; Hypertension in her mother.  Constitutional Exam  General appearance: Well nourished, well developed, and well hydrated. In no apparent acute distress Vitals:   09/01/17 1040  BP: (!) 149/103  Pulse: 97  Temp: 98.4 F (36.9 C)  SpO2: 98%  Weight: 123 lb (55.8 kg)  Height: '4\' 11"'  (1.499 m)  Psych/Mental status:  Alert, oriented x 3 (person, place, & time)       Eyes: PERLA Respiratory: No evidence of acute respiratory distress  Lumbar Spine Area Exam  Skin & Axial Inspection: No masses, redness, or swelling Alignment: Symmetrical Functional ROM: Unrestricted ROM       Stability: No instability detected Muscle Tone/Strength: Functionally intact. No obvious neuro-muscular anomalies detected. Sensory (Neurological): Unimpaired Palpation: Complains of area being tender to palpation       Provocative Tests: Lumbar Hyperextension/rotation test: Positive bilaterally for facet joint pain. Lumbar quadrant test (Kemp's test): deferred today       Lumbar Lateral bending test: deferred today       Patrick's Maneuver: deferred today                   FABER test: deferred today       Thigh-thrust test: deferred today       S-I  compression test: deferred today       S-I distraction test: deferred today        Gait & Posture Assessment  Ambulation: Unassisted Gait: Relatively normal for age and body habitus Posture: WNL   Lower Extremity Exam    Side: Right lower extremity  Side: Left lower extremity  Stability: No instability observed          Stability: No instability observed          Skin & Extremity Inspection: Skin color, temperature, and hair growth are WNL. No peripheral edema or cyanosis. No masses, redness, swelling, asymmetry, or associated skin lesions. No contractures.  Skin & Extremity Inspection: Skin color, temperature, and hair growth are WNL. No peripheral edema or cyanosis. No masses, redness, swelling, asymmetry, or associated skin lesions. No contractures.  Functional ROM: Unrestricted ROM                  Functional ROM: Unrestricted ROM                  Muscle Tone/Strength: Deconditioned  Muscle Tone/Strength: Deconditioned  Sensory (Neurological): Unimpaired  Sensory (Neurological): Unimpaired  Palpation: No palpable anomalies  Palpation: No palpable anomalies   Assessment   Primary Diagnosis & Pertinent Problem List: The primary encounter diagnosis was Chronic bilateral low back pain with bilateral sciatica. Diagnoses of Chronic myofascial pain and Chronic pain syndrome were also pertinent to this visit.  Status Diagnosis  Controlled Controlled Controlled 1. Chronic bilateral low back pain with bilateral sciatica   2. Chronic myofascial pain   3. Chronic pain syndrome     Problems updated and reviewed during this visit: Problem  Depression  Smoker  Primary Osteoarthritis of Both Hips  Benign Paroxysmal Positional Vertigo  Fatty Liver Disease, Nonalcoholic  Ddd (Degenerative Disc Disease), Lumbar  Cardiac Murmur, Unspecified  Hyperlipidemia, Mixed  Type 2 Diabetes Mellitus Without Complications ([facility])  Essential Hypertension   Plan of Care  Pharmacotherapy (Medications Ordered): No orders of the defined types were placed in this encounter.  New Prescriptions   No medications on file   Medications administered today: [name]. [name] had no medications administered during this visit. Lab-work, procedure(s), and/or referral(s): No orders of the defined types were placed in this encounter.  Imaging and/or referral(s): None  Interventional therapies: Planned, scheduled, and/or pending:   Not at this time.    Provider-requested follow-up: Return in about 1 month (around 09/29/2017) for MedMgmt with [facility]).  No future appointments. Primary Care Physician: Sharyne Peach, MD Location: [facility] Note by: Vevelyn Francois NP Date: 09/01/2017; Time: 11:15 AM  Pain Score Disclaimer: We use the NRS-11 scale. This is a self-reported, subjective measurement of pain severity with only modest accuracy. It is used primarily to identify changes within a particular patient. It must be understood that outpatient pain scales are significantly less accurate that those used for research, where they can be applied  under ideal controlled circumstances with minimal exposure to variables. In reality, the score is likely to be a combination of pain intensity and pain affect, where pain affect describes the degree of emotional arousal or changes in action readiness caused by the sensory experience of pain. Factors such as social and work situation, setting, emotional state, anxiety levels, expectation, and prior pain experience may influence pain perception and show large inter-individual differences that may also be affected by time variables.  Patient instructions provided during this appointment: Patient Instructions  ____________________________________________________________________________________________  Medication Rules  Applies to: All patients receiving prescriptions (written or electronic).  Pharmacy of record: Pharmacy where electronic prescriptions will be sent. If written prescriptions are taken to a different pharmacy, please inform the nursing staff. The pharmacy listed in the electronic medical record should be the one where you would like electronic prescriptions to be sent.  Prescription refills: Only during scheduled appointments. Applies to both, written and electronic prescriptions.  NOTE: The following applies primarily to controlled substances (Opioid* Pain Medications).   Patient's responsibilities: 1. Pain Pills: Bring all pain pills to every appointment (except for procedure appointments). 2. Pill Bottles: Bring pills in original pharmacy bottle. Always bring newest bottle. Bring bottle, even if empty. 3. Medication refills: You are responsible for knowing and keeping track of what medications you need refilled. The day before your appointment, write a list of all prescriptions that need to be refilled. Bring that list to your appointment and give it to the admitting nurse. Prescriptions will be written only during appointments. If you forget a medication, it will not be "Called in",  "Faxed", or "electronically sent". You will need to get another appointment to get these prescribed. 4. Prescription Accuracy: You are responsible for carefully inspecting your prescriptions before leaving our office. Have the discharge nurse carefully go over each prescription with you, before taking them home. Make sure that your name is accurately spelled, that your address is correct. Check the name and dose of your medication to make sure it is accurate. Check the number of pills, and the written instructions to make sure they are clear and accurate. Make sure that you are given enough medication to last until your next medication refill appointment. 5. Taking Medication: Take medication as prescribed. Never take more pills than instructed. Never take medication more frequently than prescribed. Taking less pills or less frequently is permitted and encouraged, when it comes to controlled substances (written prescriptions).  6. Inform other Doctors: Always inform, all of your healthcare providers, of all the medications you take. 7. Pain Medication from other Providers: You are not allowed to accept any additional pain medication from any other Doctor or Healthcare provider. There are two exceptions to this rule. (see below) In the event that you require additional pain medication, you are responsible for notifying us, as stated below. 8. Medication Agreement: You are responsible for carefully reading and following our Medication Agreement. This must be signed before receiving any prescriptions from our practice. Safely store a copy of your signed Agreement. Violations to the Agreement will result in no further prescriptions. (Additional copies of our Medication Agreement are available upon request.) 9. Laws, Rules, & Regulations: All patients are expected to follow all Federal and [employer], Rules, [employer]. Ignorance of the Laws does not constitute a valid excuse. The use of any illegal  substances is prohibited. 10. Adopted CDC guidelines & recommendations: Target dosing levels will be at or below 60 MME/day. Use of benzodiazepines** is not recommended.  Exceptions: There are only two exceptions to the rule of not receiving pain medications from other Healthcare Providers. 1. Exception #1 (Emergencies): In the event of an emergency (i.e.: accident requiring emergency care), you are allowed to receive additional pain medication. However, you are responsible for: As soon as you are able, call our office [phone], at any time of the day or night, and leave a message stating your name, the date and nature of the emergency, and the name and dose of the medication  prescribed. In the event that your call is answered by a member of our staff, make sure to document and save the date, time, and the name of the person that took your information.  2. Exception #2 (Planned Surgery): In the event that you are scheduled by another doctor or dentist to have any type of surgery or procedure, you are allowed (for a period no longer than 30 days), to receive additional pain medication, for the acute post-op pain. However, in this case, you are responsible for picking up a copy of our "Post-op Pain Management for Surgeons" handout, and giving it to your surgeon or dentist. This document is available at our office, and does not require an appointment to obtain it. Simply go to our office during business hours (Monday-Thursday from 8:00 AM to 4:00 PM) (Friday 8:00 AM to 12:00 Noon) or if you have a scheduled appointment with [name], prior to your surgery, and ask for it by name. In addition, you will need to provide [name] with your name, name of your surgeon, type of surgery, and date of procedure or surgery.  *Opioid medications include: morphine, codeine, oxycodone, oxymorphone, hydrocodone, hydromorphone, meperidine, tramadol, tapentadol, buprenorphine, fentanyl, methadone. **Benzodiazepine medications  include: diazepam (Valium), alprazolam (Xanax), clonazepam (Klonopine), lorazepam (Ativan), clorazepate (Tranxene), chlordiazepoxide (Librium), estazolam (Prosom), oxazepam (Serax), temazepam (Restoril), triazolam (Halcion) (Last updated: 05/22/2017) ____________________________________________________________________________________________

## 2017-09-01 NOTE — Progress Notes (Signed)
Nursing Pain Medication Assessment:  Safety precautions to be maintained throughout the outpatient stay will include: orient to surroundings, keep bed in low position, maintain call bell within reach at all times, provide assistance with transfer out of bed and ambulation.  Medication Inspection Compliance: Pill count conducted under aseptic conditions, in front of the patient. Neither the pills nor the bottle was removed from the patient's sight at any time. Once count was completed pills were immediately returned to the patient in their original bottle.  Medication: Hydrocodone/APAP Pill/Patch Count: 22 of 90 pills remain Pill/Patch Appearance: Markings consistent with prescribed medication Bottle Appearance: Standard pharmacy container. Clearly labeled. Filled Date: 5 / 23 / 2019 Last Medication intake:  Today

## 2017-09-01 NOTE — Patient Instructions (Addendum)
____________________________________________________________________________________________  Medication Rules  Applies to: All patients receiving prescriptions (written or electronic).  Pharmacy of record: Pharmacy where electronic prescriptions will be sent. If written prescriptions are taken to a different pharmacy, please inform the nursing staff. The pharmacy listed in the electronic medical record should be the one where you would like electronic prescriptions to be sent.  Prescription refills: Only during scheduled appointments. Applies to both, written and electronic prescriptions.  NOTE: The following applies primarily to controlled substances (Opioid* Pain Medications).   Patient's responsibilities: 1. Pain Pills: Bring all pain pills to every appointment (except for procedure appointments). 2. Pill Bottles: Bring pills in original pharmacy bottle. Always bring newest bottle. Bring bottle, even if empty. 3. Medication refills: You are responsible for knowing and keeping track of what medications you need refilled. The day before your appointment, write a list of all prescriptions that need to be refilled. Bring that list to your appointment and give it to the admitting nurse. Prescriptions will be written only during appointments. If you forget a medication, it will not be "Called in", "Faxed", or "electronically sent". You will need to get another appointment to get these prescribed. 4. Prescription Accuracy: You are responsible for carefully inspecting your prescriptions before leaving our office. Have the discharge nurse carefully go over each prescription with you, before taking them home. Make sure that your name is accurately spelled, that your address is correct. Check the name and dose of your medication to make sure it is accurate. Check the number of pills, and the written instructions to make sure they are clear and accurate. Make sure that you are given enough medication to last  until your next medication refill appointment. 5. Taking Medication: Take medication as prescribed. Never take more pills than instructed. Never take medication more frequently than prescribed. Taking less pills or less frequently is permitted and encouraged, when it comes to controlled substances (written prescriptions).  6. Inform other Doctors: Always inform, all of your healthcare providers, of all the medications you take. 7. Pain Medication from other Providers: You are not allowed to accept any additional pain medication from any other Doctor or Healthcare provider. There are two exceptions to this rule. (see below) In the event that you require additional pain medication, you are responsible for notifying us, as stated below. 8. Medication Agreement: You are responsible for carefully reading and following our Medication Agreement. This must be signed before receiving any prescriptions from our practice. Safely store a copy of your signed Agreement. Violations to the Agreement will result in no further prescriptions. (Additional copies of our Medication Agreement are available upon request.) 9. Laws, Rules, & Regulations: All patients are expected to follow all Federal and State Laws, Statutes, Rules, & Regulations. Ignorance of the Laws does not constitute a valid excuse. The use of any illegal substances is prohibited. 10. Adopted CDC guidelines & recommendations: Target dosing levels will be at or below 60 MME/day. Use of benzodiazepines** is not recommended.  Exceptions: There are only two exceptions to the rule of not receiving pain medications from other Healthcare Providers. 1. Exception #1 (Emergencies): In the event of an emergency (i.e.: accident requiring emergency care), you are allowed to receive additional pain medication. However, you are responsible for: As soon as you are able, call our office [phone], at any time of the day or night, and leave a message stating your name, the  date and nature of the emergency, and the name and dose of the medication   prescribed. In the event that your call is answered by a member of our staff, make sure to document and save the date, time, and the name of the person that took your information.  2. Exception #2 (Planned Surgery): In the event that you are scheduled by another doctor or dentist to have any type of surgery or procedure, you are allowed (for a period no longer than 30 days), to receive additional pain medication, for the acute post-op pain. However, in this case, you are responsible for picking up a copy of our "Post-op Pain Management for Surgeons" handout, and giving it to your surgeon or dentist. This document is available at our office, and does not require an appointment to obtain it. Simply go to our office during business hours (Monday-Thursday from 8:00 AM to 4:00 PM) (Friday 8:00 AM to 12:00 Noon) or if you have a scheduled appointment with us, prior to your surgery, and ask for it by name. In addition, you will need to provide us with your name, name of your surgeon, type of surgery, and date of procedure or surgery.  *Opioid medications include: morphine, codeine, oxycodone, oxymorphone, hydrocodone, hydromorphone, meperidine, tramadol, tapentadol, buprenorphine, fentanyl, methadone. **Benzodiazepine medications include: diazepam (Valium), alprazolam (Xanax), clonazepam (Klonopine), lorazepam (Ativan), clorazepate (Tranxene), chlordiazepoxide (Librium), estazolam (Prosom), oxazepam (Serax), temazepam (Restoril), triazolam (Halcion) (Last updated: 05/22/2017) ____________________________________________________________________________________________ Bonita QuinYou were given one prescription for Hydrocodone today.

## 2017-09-29 ENCOUNTER — Other Ambulatory Visit: Payer: Self-pay

## 2017-09-29 ENCOUNTER — Other Ambulatory Visit: Payer: Self-pay | Admitting: Student in an Organized Health Care Education/Training Program

## 2017-09-29 ENCOUNTER — Ambulatory Visit: Payer: Medicaid Other | Attending: Nurse Practitioner | Admitting: Nurse Practitioner

## 2017-09-29 ENCOUNTER — Encounter: Payer: Self-pay | Admitting: Nurse Practitioner

## 2017-09-29 VITALS — BP 138/94 | HR 111 | Temp 98.0°F | Resp 16 | Ht 59.0 in | Wt 123.0 lb

## 2017-09-29 DIAGNOSIS — F329 Major depressive disorder, single episode, unspecified: Secondary | ICD-10-CM | POA: Insufficient documentation

## 2017-09-29 DIAGNOSIS — H8110 Benign paroxysmal vertigo, unspecified ear: Secondary | ICD-10-CM | POA: Diagnosis not present

## 2017-09-29 DIAGNOSIS — K5792 Diverticulitis of intestine, part unspecified, without perforation or abscess without bleeding: Secondary | ICD-10-CM | POA: Diagnosis not present

## 2017-09-29 DIAGNOSIS — Z888 Allergy status to other drugs, medicaments and biological substances status: Secondary | ICD-10-CM | POA: Insufficient documentation

## 2017-09-29 DIAGNOSIS — M5116 Intervertebral disc disorders with radiculopathy, lumbar region: Secondary | ICD-10-CM | POA: Diagnosis not present

## 2017-09-29 DIAGNOSIS — J449 Chronic obstructive pulmonary disease, unspecified: Secondary | ICD-10-CM | POA: Diagnosis not present

## 2017-09-29 DIAGNOSIS — E119 Type 2 diabetes mellitus without complications: Secondary | ICD-10-CM | POA: Insufficient documentation

## 2017-09-29 DIAGNOSIS — Z882 Allergy status to sulfonamides status: Secondary | ICD-10-CM | POA: Diagnosis not present

## 2017-09-29 DIAGNOSIS — K760 Fatty (change of) liver, not elsewhere classified: Secondary | ICD-10-CM | POA: Diagnosis not present

## 2017-09-29 DIAGNOSIS — I10 Essential (primary) hypertension: Secondary | ICD-10-CM | POA: Diagnosis not present

## 2017-09-29 DIAGNOSIS — R011 Cardiac murmur, unspecified: Secondary | ICD-10-CM | POA: Insufficient documentation

## 2017-09-29 DIAGNOSIS — Z79899 Other long term (current) drug therapy: Secondary | ICD-10-CM | POA: Insufficient documentation

## 2017-09-29 DIAGNOSIS — M797 Fibromyalgia: Secondary | ICD-10-CM | POA: Diagnosis not present

## 2017-09-29 DIAGNOSIS — E782 Mixed hyperlipidemia: Secondary | ICD-10-CM | POA: Insufficient documentation

## 2017-09-29 DIAGNOSIS — Z7982 Long term (current) use of aspirin: Secondary | ICD-10-CM | POA: Insufficient documentation

## 2017-09-29 DIAGNOSIS — M5441 Lumbago with sciatica, right side: Secondary | ICD-10-CM | POA: Insufficient documentation

## 2017-09-29 DIAGNOSIS — I700 Atherosclerosis of aorta: Secondary | ICD-10-CM | POA: Insufficient documentation

## 2017-09-29 DIAGNOSIS — M5442 Lumbago with sciatica, left side: Secondary | ICD-10-CM | POA: Insufficient documentation

## 2017-09-29 DIAGNOSIS — F17210 Nicotine dependence, cigarettes, uncomplicated: Secondary | ICD-10-CM | POA: Diagnosis not present

## 2017-09-29 DIAGNOSIS — G894 Chronic pain syndrome: Secondary | ICD-10-CM | POA: Diagnosis not present

## 2017-09-29 DIAGNOSIS — Z7984 Long term (current) use of oral hypoglycemic drugs: Secondary | ICD-10-CM | POA: Diagnosis not present

## 2017-09-29 DIAGNOSIS — G8929 Other chronic pain: Secondary | ICD-10-CM | POA: Diagnosis not present

## 2017-09-29 DIAGNOSIS — M7918 Myalgia, other site: Secondary | ICD-10-CM | POA: Diagnosis not present

## 2017-09-29 DIAGNOSIS — M545 Low back pain: Secondary | ICD-10-CM | POA: Diagnosis present

## 2017-09-29 DIAGNOSIS — M5416 Radiculopathy, lumbar region: Secondary | ICD-10-CM

## 2017-09-29 DIAGNOSIS — M160 Bilateral primary osteoarthritis of hip: Secondary | ICD-10-CM | POA: Diagnosis not present

## 2017-09-29 MED ORDER — HYDROCODONE-ACETAMINOPHEN 5-325 MG PO TABS: 1.0000 | ORAL_TABLET | Freq: Three times a day (TID) | ORAL | 0 refills | Status: DC | PRN

## 2017-09-29 MED ORDER — PREGABALIN 25 MG PO CAPS: 25.0000 mg | ORAL_CAPSULE | Freq: Three times a day (TID) | ORAL | 0 refills | Status: DC

## 2017-09-29 MED ORDER — MELOXICAM 15 MG PO TABS: 15.0000 mg | ORAL_TABLET | Freq: Every day | ORAL | 0 refills | Status: AC

## 2017-09-29 NOTE — Patient Instructions (Signed)
____________________________________________________________________________________________  Medication Rules  Applies to: All patients receiving prescriptions (written or electronic).  Pharmacy of record: Pharmacy where electronic prescriptions will be sent. If written prescriptions are taken to a different pharmacy, please inform the nursing staff. The pharmacy listed in the electronic medical record should be the one where you would like electronic prescriptions to be sent.  Prescription refills: Only during scheduled appointments. Applies to both, written and electronic prescriptions.  NOTE: The following applies primarily to controlled substances (Opioid* Pain Medications).   Patient's responsibilities: 1. Pain Pills: Bring all pain pills to every appointment (except for procedure appointments). 2. Pill Bottles: Bring pills in original pharmacy bottle. Always bring newest bottle. Bring bottle, even if empty. 3. Medication refills: You are responsible for knowing and keeping track of what medications you need refilled. The day before your appointment, write a list of all prescriptions that need to be refilled. Bring that list to your appointment and give it to the admitting nurse. Prescriptions will be written only during appointments. If you forget a medication, it will not be "Called in", "Faxed", or "electronically sent". You will need to get another appointment to get these prescribed. 4. Prescription Accuracy: You are responsible for carefully inspecting your prescriptions before leaving our office. Have the discharge nurse carefully go over each prescription with you, before taking them home. Make sure that your name is accurately spelled, that your address is correct. Check the name and dose of your medication to make sure it is accurate. Check the number of pills, and the written instructions to make sure they are clear and accurate. Make sure that you are given enough medication to last  until your next medication refill appointment. 5. Taking Medication: Take medication as prescribed. Never take more pills than instructed. Never take medication more frequently than prescribed. Taking less pills or less frequently is permitted and encouraged, when it comes to controlled substances (written prescriptions).  6. Inform other Doctors: Always inform, all of your healthcare providers, of all the medications you take. 7. Pain Medication from other Providers: You are not allowed to accept any additional pain medication from any other Doctor or Healthcare provider. There are two exceptions to this rule. (see below) In the event that you require additional pain medication, you are responsible for notifying us, as stated below. 8. Medication Agreement: You are responsible for carefully reading and following our Medication Agreement. This must be signed before receiving any prescriptions from our practice. Safely store a copy of your signed Agreement. Violations to the Agreement will result in no further prescriptions. (Additional copies of our Medication Agreement are available upon request.) 9. Laws, Rules, & Regulations: All patients are expected to follow all Federal and State Laws, Statutes, Rules, & Regulations. Ignorance of the Laws does not constitute a valid excuse. The use of any illegal substances is prohibited. 10. Adopted CDC guidelines & recommendations: Target dosing levels will be at or below 60 MME/day. Use of benzodiazepines** is not recommended.  Exceptions: There are only two exceptions to the rule of not receiving pain medications from other Healthcare Providers. 1. Exception #1 (Emergencies): In the event of an emergency (i.e.: accident requiring emergency care), you are allowed to receive additional pain medication. However, you are responsible for: As soon as you are able, call our office [phone], at any time of the day or night, and leave a message stating your name, the  date and nature of the emergency, and the name and dose of the medication   prescribed. In the event that your call is answered by a member of our staff, make sure to document and save the date, time, and the name of the person that took your information.  2. Exception #2 (Planned Surgery): In the event that you are scheduled by another doctor or dentist to have any type of surgery or procedure, you are allowed (for a period no longer than 30 days), to receive additional pain medication, for the acute post-op pain. However, in this case, you are responsible for picking up a copy of our "Post-op Pain Management for Surgeons" handout, and giving it to your surgeon or dentist. This document is available at our office, and does not require an appointment to obtain it. Simply go to our office during business hours (Monday-Thursday from 8:00 AM to 4:00 PM) (Friday 8:00 AM to 12:00 Noon) or if you have a scheduled appointment with us, prior to your surgery, and ask for it by name. In addition, you will need to provide us with your name, name of your surgeon, type of surgery, and date of procedure or surgery.  *Opioid medications include: morphine, codeine, oxycodone, oxymorphone, hydrocodone, hydromorphone, meperidine, tramadol, tapentadol, buprenorphine, fentanyl, methadone. **Benzodiazepine medications include: diazepam (Valium), alprazolam (Xanax), clonazepam (Klonopine), lorazepam (Ativan), clorazepate (Tranxene), chlordiazepoxide (Librium), estazolam (Prosom), oxazepam (Serax), temazepam (Restoril), triazolam (Halcion) (Last updated: 05/22/2017) ____________________________________________________________________________________________    

## 2017-09-29 NOTE — Progress Notes (Signed)
Nursing Pain Medication Assessment:  Safety precautions to be maintained throughout the outpatient stay will include: orient to surroundings, keep bed in low position, maintain call bell within reach at all times, provide assistance with transfer out of bed and ambulation.  Medication Inspection Compliance: Pill count conducted under aseptic conditions, in front of the patient. Neither the pills nor the bottle was removed from the patient's sight at any time. Once count was completed pills were immediately returned to the patient in their original bottle.  Medication: Hydrocodone/APAP Pill/Patch Count: 42 of 90 pills remain Pill/Patch Appearance: Markings consistent with prescribed medication Bottle Appearance: Standard pharmacy container. Clearly labeled. Filled Date: 06 / 22 / 2019 Last Medication intake:  Today

## 2017-09-29 NOTE — Progress Notes (Signed)
Patient's Name: [name]  MRN: [id number]  Referring Provider: Sharyne Peach, MD  DOB: [birth year]  PCP: Sharyne Peach, MD  DOS: 09/29/2017  Note by: Vevelyn Francois NP  Service setting: Ambulatory outpatient  Specialty: Interventional Pain Management  Location: [facility] (AMB) Pain Management Facility    Patient type: Established    Primary Reason(s) for Visit: Encounter for prescription drug management. (Level of risk: moderate)  CC: Back Pain  HPI  [name] is a 57 y.o. year old, female patient, who comes today for a medication management evaluation. She has Essential hypertension; Diabetes ([place]); Fibromyalgia; COPD (chronic obstructive pulmonary disease) ([place]); Diverticulitis; Lumbar radiculopathy; Chronic bilateral low back pain with bilateral sciatica; Chronic pain syndrome; Chronic, continuous use of opioids; Chronic myofascial pain; Benign paroxysmal positional vertigo; Cardiac murmur, unspecified; DDD (degenerative disc disease), lumbar; Depression; Fatty liver disease, nonalcoholic; Primary osteoarthritis of both hips; Hyperlipidemia, mixed; Smoker; and Type 2 diabetes mellitus without complications ([place]) on their problem list. Her primarily concern today is the Back Pain  Pain Assessment: Location: Lower Back Radiating:   Onset: More than a month ago Duration: Chronic pain Quality: Aching, Burning, Constant, Nagging Severity: 6 /10 (subjective, self-reported pain score)  Note: Reported level is compatible with observation. Clinically the patient looks like a 2/10 A 2/10 is viewed as "Mild to Moderate" and described as noticeable and distracting. Impossible to hide from other people. More frequent flare-ups. Still possible to adapt and function close to normal. It can be very annoying and may have occasional stronger flare-ups. With discipline, patients may get used to it and adapt.       When using our objective Pain Scale, levels between 6 and 10/10 are said to belong  in an emergency room, as it progressively worsens from a 6/10, described as severely limiting, requiring emergency care not usually available at an outpatient pain management facility. At a 6/10 level, communication becomes difficult and requires great effort. Assistance to reach the emergency department may be required. Facial flushing and profuse sweating along with potentially dangerous increases in heart rate and blood pressure will be evident. Effect on ADL: slower Timing: Constant Modifying factors: medications and rest BP: (!) 138/94  HR: (!) 111  [name] was last scheduled for an appointment on 09/01/2017 for medication management. During today's appointment we reviewed [name]'s chronic pain status, as well as her outpatient medication regimen. She has bilateral leg pain. She admits that the Tramadol is not effective for her pain. She states that she was given Meloxicam for inflammation and it was effective.   She  is numbness and tingling in her legs. She states that she did not feel like Gabapentin was effective. She denies the use of Lyrica.  The patient  reports that she does not use drugs. Her body mass index is 24.84 kg/m.  Further details on both, my assessment(s), as well as the proposed treatment plan, please see below.  Controlled Substance Pharmacotherapy Assessment REMS (Risk Evaluation and Mitigation Strategy)  Analgesic:Hydrocodone 5 mg TID prn MME/day:47m/day.  SHart Rochester RN  09/29/2017 11:18 AM  Sign at close encounter Nursing Pain Medication Assessment:  Safety precautions to be maintained throughout the outpatient stay will include: orient to surroundings, keep bed in low position, maintain call bell within reach at all times, provide assistance with transfer out of bed and ambulation.  Medication Inspection Compliance: Pill count conducted under aseptic conditions, in front of the patient. Neither the pills nor the bottle was  removed from the  patient's sight at any time. Once count was completed pills were immediately returned to the patient in their original bottle.  Medication: Hydrocodone/APAP Pill/Patch Count: 42 of 90 pills remain Pill/Patch Appearance: Markings consistent with prescribed medication Bottle Appearance: Standard pharmacy container. Clearly labeled. Filled Date: 06 / 22 / 2019 Last Medication intake:  Today   Pharmacokinetics: Liberation and absorption (onset of action): WNL Distribution (time to peak effect): WNL Metabolism and excretion (duration of action): WNL         Pharmacodynamics: Desired effects: Analgesia: Ms. Kramm reports >50% benefit. Functional ability: Patient reports that medication allows her to accomplish basic ADLs Clinically meaningful improvement in function (CMIF): Sustained CMIF goals met Perceived effectiveness: Described as relatively effective, allowing for increase in activities of daily living (ADL) Undesirable effects: Side-effects or Adverse reactions: None reported Monitoring: [place] PMP: Online review of the past 65-monthperiod conducted. Compliant with practice rules and regulations Last UDS on record: Summary  Date Value Ref Range Status  07/14/2017 FINAL  Final    Comment:    ==================================================================== TOXASSURE SELECT 13 (MW) ==================================================================== Test                             Result       Flag       Units Drug Present and Declared for Prescription Verification   Hydrocodone                    1416         EXPECTED   ng/mg creat   Hydromorphone                  359          EXPECTED   ng/mg creat   Dihydrocodeine                 388          EXPECTED   ng/mg creat   Norhydrocodone                 1402         EXPECTED   ng/mg creat    Sources of hydrocodone include scheduled prescription    medications. Hydromorphone, dihydrocodeine and norhydrocodone are    expected  metabolites of hydrocodone. Hydromorphone and    dihydrocodeine are also available as scheduled prescription    medications. ==================================================================== Test                      Result    Flag   Units      Ref Range   Creatinine              131              mg/dL      >=20 ==================================================================== Declared Medications:  The flagging and interpretation on this report are based on the  following declared medications.  Unexpected results may arise from  inaccuracies in the declared medications.  **Note: The testing scope of this panel includes these medications:  Hydrocodone (Hydrocodone-Acetaminophen)  **Note: The testing scope of this panel does not include following  reported medications:  Acetaminophen (Hydrocodone-Acetaminophen)  Albuterol  Aspirin (Aspirin 81)  Citalopram  Cyclobenzaprine  Fluticasone  Gabapentin  Lisinopril  Meclizine  Meloxicam  Metformin  Mirtazapine  Nicotine  Pantoprazole  Polyethylene Glycol  Ranitidine  Rizatriptan  Rosuvastatin  Simethicone  Sucralfate  Topiramate  Trazodone ==================================================================== For clinical consultation, please call [phone]. ====================================================================    UDS interpretation: Compliant          Medication Assessment Form: Reviewed. Patient indicates being compliant with therapy Treatment compliance: Compliant Risk Assessment Profile: Aberrant behavior: See prior evaluations. None observed or detected today Comorbid factors increasing risk of overdose: See prior notes. No additional risks detected today Risk of substance use disorder (SUD): Low Opioid Risk Tool - 08/05/17 1111      Family History of Substance Abuse   Alcohol  Positive Female    Illegal Drugs  Negative    Rx Drugs  Negative      Personal History of Substance Abuse    Alcohol  Positive Female or Female    Illegal Drugs  Positive Female or Female    Rx Drugs  Negative      Age   Age between 54-45 years   No      History of Preadolescent Sexual Abuse   History of Preadolescent Sexual Abuse  Negative or Female      Psychological Disease   Psychological Disease  Negative    Depression  Negative      Total Score   Opioid Risk Tool Scoring  8    Opioid Risk Interpretation  High Risk      ORT Scoring interpretation table:  Score <3 = Low Risk for SUD  Score between 4-7 = Moderate Risk for SUD  Score >8 = High Risk for Opioid Abuse   Risk Mitigation Strategies:  Patient Counseling: Covered Patient-Prescriber Agreement (PPA): Present and active  Notification to other healthcare providers: Done  Pharmacologic Plan: No change in therapy, at this time.             Laboratory Chemistry  Inflammation Markers (CRP: Acute Phase) (ESR: Chronic Phase) Lab Results  Component Value Date   LATICACIDVEN 1.6 10/14/2016                         Rheumatology Markers No results found for: RF, ANA, LABURIC, URICUR, LYMEIGGIGMAB, LYMEABIGMQN, HLAB27                      Renal Function Markers Lab Results  Component Value Date   BUN 14 10/15/2016   CREATININE 0.60 06/26/2017   BCR 13 05/02/2016   GFRAA >60 10/15/2016   GFRNONAA >60 10/15/2016                             Hepatic Function Markers Lab Results  Component Value Date   AST 16 10/14/2016   ALT 16 10/14/2016   ALBUMIN 4.5 10/14/2016   ALKPHOS 66 10/14/2016   LIPASE 24 10/14/2016                        Electrolytes Lab Results  Component Value Date   NA 138 10/15/2016   K 4.4 10/15/2016   CL 107 10/15/2016   CALCIUM 8.3 (L) 10/15/2016                        Neuropathy Markers Lab Results  Component Value Date   HGBA1C 10.4 (H) 05/02/2016   HIV Non Reactive 10/15/2016                        Bone  Pathology Markers No results found for: Marveen Reeks, [id number], [id number],  25OHVITD1, 25OHVITD2, 25OHVITD3, TESTOFREE, TESTOSTERONE                       Coagulation Parameters Lab Results  Component Value Date   PLT 182 10/15/2016                        Cardiovascular Markers Lab Results  Component Value Date   TROPONINI <0.03 10/16/2016   HGB 12.1 10/15/2016   HCT 37.1 10/15/2016                         CA Markers No results found for: CEA, CA125, LABCA2                      Note: Lab results reviewed.  Recent Diagnostic Imaging Results  CT ABDOMEN PELVIS W CONTRAST CLINICAL DATA:  Left-sided abdominal pain despite antibiotic treatment  EXAM: CT ABDOMEN AND PELVIS WITH CONTRAST  TECHNIQUE: Multidetector CT imaging of the abdomen and pelvis was performed using the standard protocol following bolus administration of intravenous contrast.  CONTRAST:  159m ISOVUE-300 IOPAMIDOL (ISOVUE-300) INJECTION 61%  COMPARISON:  CT abdomen pelvis of 10/14/2016  FINDINGS: Lower chest: The lung bases are clear. Cardiomegaly is stable. No pericardial effusion is seen.  Hepatobiliary: The liver enhances with no focal abnormality and no ductal dilatation is seen. The left lobe of liver is somewhat prominent but stable compared to the prior CT. A small single gallstone within the gallbladder cannot be excluded.  Pancreas: The pancreas is normal in size and the pancreatic duct is not dilated.  Spleen: The spleen is unremarkable.  Adrenals/Urinary Tract: The adrenal glands appear normal. The kidneys enhance with no calculus or mass and on delayed images, no hydronephrosis is seen. The ureters are normal in caliber. The urinary bladder is moderately well distended with no abnormality noted.  Stomach/Bowel: The stomach is moderately distended with oral contrast and food debris. No abnormality of small bowel is seen. There are a few scattered rectosigmoid and descending colon diverticula present with a few scattered throughout the more proximal colon  as well. A moderate to large amount of feces is noted throughout the entire colon. The terminal ileum is well opacified with no abnormality. There is no present evidence of diverticulitis.  Vascular/Lymphatic: The abdominal aorta is normal in caliber with moderate abdominal aortic atherosclerosis present. No adenopathy is seen.  Reproductive: The uterus has previously been resected. No adnexal lesion is seen. No fluid is noted within the pelvis.  Other: No abdominal wall hernia is seen.  Musculoskeletal: The lumbar vertebrae are in normal alignment with normal intervertebral disc spaces. The SI joints are corticated.  IMPRESSION: 1. Although there are scattered colonic diverticula, there is no present evidence of diverticulitis. The appendix and terminal ileum also are unremarkable. 2. There is a moderately large amount of feces throughout the colon. 3. Cannot exclude a single small gallstone within the gallbladder. 4. Moderate abdominal aortic atherosclerosis.  Electronically Signed   By: PIvar DrapeM.D.   On: 06/26/2017 14:14  Complexity Note: Imaging results reviewed. Results shared with Ms. Medel, using Layman's terms.                         Meds   Current Outpatient Medications:  .  albuterol (PROVENTIL HFA;VENTOLIN HFA) 108 (90 Base) MCG/ACT  inhaler, Inhale 1 puff into the lungs 2 (two) times daily as needed., Disp: , Rfl:  .  aspirin 81 MG tablet, Take 81 mg by mouth daily. , Disp: , Rfl:  .  cyclobenzaprine (FLEXERIL) 10 MG tablet, Take 1 tablet (10 mg total) by mouth 3 (three) times daily., Disp: 30 tablet, Rfl: 0 .  fluticasone (FLONASE) 50 MCG/ACT nasal spray, Place 1 spray into both nostrils daily., Disp: , Rfl:  .  [START ON 10/13/2017] HYDROcodone-acetaminophen (NORCO/VICODIN) 5-325 MG tablet, Take 1 tablet by mouth 3 (three) times daily as needed. For chronic pain To last for 30 days from fill date, Disp: 90 tablet, Rfl: 0 .  lisinopril (PRINIVIL,ZESTRIL)  10 MG tablet, Take 10 mg by mouth daily. , Disp: , Rfl:  .  metFORMIN (GLUCOPHAGE) 1000 MG tablet, Take 1 tablet by mouth 2 (two) times daily., Disp: , Rfl:  .  mirtazapine (REMERON) 15 MG tablet, Take 15 mg by mouth at bedtime., Disp: , Rfl:  .  pantoprazole (PROTONIX) 40 MG tablet, Take 40 mg by mouth daily., Disp: , Rfl:  .  polyethylene glycol (MIRALAX / GLYCOLAX) packet, Take 17 g by mouth daily., Disp: , Rfl:  .  ranitidine (ZANTAC) 300 MG tablet, Take 300 mg by mouth at bedtime., Disp: , Rfl:  .  rizatriptan (MAXALT) 10 MG tablet, Take 10 mg by mouth as needed for migraine. May repeat in 2 hours if needed, Disp: , Rfl:  .  sucralfate (CARAFATE) 1 g tablet, Take 1 g by mouth 4 (four) times daily -  with meals and at bedtime., Disp: , Rfl:  .  topiramate (TOPAMAX) 25 MG tablet, Take 25 mg by mouth 2 (two) times daily., Disp: , Rfl:  .  traZODone (DESYREL) 100 MG tablet, Take 1 tablet by mouth at bedtime as needed., Disp: , Rfl:  .  meloxicam (MOBIC) 15 MG tablet, Take 1 tablet (15 mg total) by mouth daily., Disp: 30 tablet, Rfl: 0 .  pregabalin (LYRICA) 25 MG capsule, Take 1 capsule (25 mg total) by mouth 3 (three) times daily., Disp: 90 capsule, Rfl: 0  ROS  Constitutional: Denies any fever or chills Gastrointestinal: No reported hemesis, hematochezia, vomiting, or acute GI distress Musculoskeletal: Denies any acute onset joint swelling, redness, loss of ROM, or weakness Neurological: No reported episodes of acute onset apraxia, aphasia, dysarthria, agnosia, amnesia, paralysis, loss of coordination, or loss of consciousness  Allergies  Ms. Jacinto is allergic to tramadol; benadryl [diphenhydramine hcl]; bextra [valdecoxib]; invokana [canagliflozin]; and sulfamethoxazole-trimethoprim.  PFSH  Drug: Ms. Wolfer  reports that she does not use drugs. Alcohol:  reports that she does not drink alcohol. Tobacco:  reports that she has been smoking cigarettes.  She has been smoking about  0.50 packs per day. She uses smokeless tobacco. Medical:  has a past medical history of Asthma, Asthma, BPPV (benign paroxysmal positional vertigo), Cardiac murmur, unspecified (08/28/2016), Degenerative disc disease, lumbar, Depression, Diabetes mellitus, Fatty liver, Fibromyalgia, Fibromyalgia, GERD (gastroesophageal reflux disease), History of degenerative disc disease, Hyperlipidemia, and Hypertension. Surgical: Ms. Slotnick  has a past surgical history that includes Abdominal surgery; Abdominal hysterectomy; Esophagogastroduodenoscopy (egd) with propofol (N/A, 02/20/2017); Colonoscopy with propofol (N/A, 02/20/2017); Upper esophageal endoscopic ultrasound (eus) (N/A, 04/03/2017); and Diverticulitis. Family: family history includes Diabetes in her mother; Hypertension in her mother.  Constitutional Exam  General appearance: Well nourished, well developed, and well hydrated. In no apparent acute distress Vitals:   09/29/17 1045  BP: (!) 138/94  Pulse: (!) 111  Resp: 16  Temp: 98 F (36.7 C)  TempSrc: Oral  SpO2: 98%  Weight: 123 lb (55.8 kg)  Height: '4\' 11"'  (1.499 m)   BMI Assessment: Estimated body mass index is 24.84 kg/m as calculated from the following:   Height as of this encounter: '4\' 11"'  (1.499 m).   Weight as of this encounter: 123 lb (55.8 kg). Psych/Mental status: Alert, oriented x 3 (person, place, & time)       Eyes: PERLA Respiratory: No evidence of acute respiratory distress  Lumbar Spine Area Exam  Skin & Axial Inspection: No masses, redness, or swelling Alignment: Symmetrical Functional ROM: Unrestricted ROM       Stability: No instability detected Muscle Tone/Strength: Functionally intact. No obvious neuro-muscular anomalies detected. Sensory (Neurological): Unimpaired Palpation: No palpable anomalies       Provocative Tests: Lumbar Hyperextension/rotation test: deferred today       Lumbar quadrant test (Kemp's test): deferred today       Lumbar Lateral  bending test: deferred today       Patrick's Maneuver: deferred today                   FABER test: deferred today       Thigh-thrust test: deferred today       S-I compression test: deferred today       S-I distraction test: deferred today        Gait & Posture Assessment  Ambulation: Unassisted Gait: Relatively normal for age and body habitus Posture: WNL   Lower Extremity Exam    Side: Right lower extremity  Side: Left lower extremity  Stability: No instability observed          Stability: No instability observed          Skin & Extremity Inspection: Skin color, temperature, and hair growth are WNL. No peripheral edema or cyanosis. No masses, redness, swelling, asymmetry, or associated skin lesions. No contractures.  Skin & Extremity Inspection: Skin color, temperature, and hair growth are WNL. No peripheral edema or cyanosis. No masses, redness, swelling, asymmetry, or associated skin lesions. No contractures.  Functional ROM: Unrestricted ROM                  Functional ROM: Unrestricted ROM                  Muscle Tone/Strength: Functionally intact. No obvious neuro-muscular anomalies detected.  Muscle Tone/Strength: Functionally intact. No obvious neuro-muscular anomalies detected.  Sensory (Neurological): Unimpaired  Sensory (Neurological): Unimpaired  Palpation: No palpable anomalies  Palpation: No palpable anomalies   Assessment  Primary Diagnosis & Pertinent Problem List: The primary encounter diagnosis was Lumbar radiculopathy. Diagnoses of Chronic myofascial pain, Chronic bilateral low back pain with bilateral sciatica, and Chronic pain syndrome were also pertinent to this visit.  Status Diagnosis  Persistent Persistent Persistent 1. Lumbar radiculopathy   2. Chronic myofascial pain   3. Chronic bilateral low back pain with bilateral sciatica   4. Chronic pain syndrome     Problems updated and reviewed during this visit: No problems updated. Plan of Care   Pharmacotherapy (Medications Ordered): Meds ordered this encounter  Medications  . pregabalin (LYRICA) 25 MG capsule    Sig: Take 1 capsule (25 mg total) by mouth 3 (three) times daily.    Dispense:  90 capsule    Refill:  0    Order Specific Question:   Supervising Provider    Answer:   Milinda Pointer [phone]  .  HYDROcodone-acetaminophen (NORCO/VICODIN) 5-325 MG tablet    Sig: Take 1 tablet by mouth 3 (three) times daily as needed. For chronic pain To last for 30 days from fill date    Dispense:  90 tablet    Refill:  0    May fill on :10/13/2017 to last until :11/12/2017    Order Specific Question:   Supervising Provider    Answer:   Milinda Pointer [phone]  . meloxicam (MOBIC) 15 MG tablet    Sig: Take 1 tablet (15 mg total) by mouth daily.    Dispense:  30 tablet    Refill:  0    Do not add this medication to the electronic "Automatic Refill" notification system. Patient may have prescription filled one day early if pharmacy is closed on scheduled refill date.    Order Specific Question:   Supervising Provider    Answer:   Milinda Pointer [phone]   New Prescriptions   MELOXICAM (MOBIC) 15 MG TABLET    Take 1 tablet (15 mg total) by mouth daily.   PREGABALIN (LYRICA) 25 MG CAPSULE    Take 1 capsule (25 mg total) by mouth 3 (three) times daily.   Medications administered today: Tylee Yum. Vasco had no medications administered during this visit. Lab-work, procedure(s), and/or referral(s): No orders of the defined types were placed in this encounter.  Imaging and/or referral(s): None  Interventional therapies: Planned, scheduled, and/or pending:   Not at this time.    Provider-requested follow-up: Return in about 1 month (around 10/27/2017) for MedMgmt with Me Donella Stade Edison Pace).  Future Appointments  Date Time Provider [place]  10/27/2017  8:45 AM Vevelyn Francois, NP [facility] None   Primary Care Physician: Sharyne Peach, MD Location: [facility]  Outpatient Pain Management Facility Note by: Vevelyn Francois NP Date: 09/29/2017; Time: 1:27 PM  Pain Score Disclaimer: We use the NRS-11 scale. This is a self-reported, subjective measurement of pain severity with only modest accuracy. It is used primarily to identify changes within a particular patient. It must be understood that outpatient pain scales are significantly less accurate that those used for research, where they can be applied under ideal controlled circumstances with minimal exposure to variables. In reality, the score is likely to be a combination of pain intensity and pain affect, where pain affect describes the degree of emotional arousal or changes in action readiness caused by the sensory experience of pain. Factors such as social and work situation, setting, emotional state, anxiety levels, expectation, and prior pain experience may influence pain perception and show large inter-individual differences that may also be affected by time variables.  Patient instructions provided during this appointment: Patient Instructions  ____________________________________________________________________________________________  Medication Rules  Applies to: All patients receiving prescriptions (written or electronic).  Pharmacy of record: Pharmacy where electronic prescriptions will be sent. If written prescriptions are taken to a different pharmacy, please inform the nursing staff. The pharmacy listed in the electronic medical record should be the one where you would like electronic prescriptions to be sent.  Prescription refills: Only during scheduled appointments. Applies to both, written and electronic prescriptions.  NOTE: The following applies primarily to controlled substances (Opioid* Pain Medications).   Patient's responsibilities: 1. Pain Pills: Bring all pain pills to every appointment (except for procedure appointments). 2. Pill Bottles: Bring pills in original pharmacy  bottle. Always bring newest bottle. Bring bottle, even if empty. 3. Medication refills: You are responsible for knowing and keeping track of what medications you need refilled.  The day before your appointment, write a list of all prescriptions that need to be refilled. Bring that list to your appointment and give it to the admitting nurse. Prescriptions will be written only during appointments. If you forget a medication, it will not be "Called in", "Faxed", or "electronically sent". You will need to get another appointment to get these prescribed. 4. Prescription Accuracy: You are responsible for carefully inspecting your prescriptions before leaving our office. Have the discharge nurse carefully go over each prescription with you, before taking them home. Make sure that your name is accurately spelled, that your address is correct. Check the name and dose of your medication to make sure it is accurate. Check the number of pills, and the written instructions to make sure they are clear and accurate. Make sure that you are given enough medication to last until your next medication refill appointment. 5. Taking Medication: Take medication as prescribed. Never take more pills than instructed. Never take medication more frequently than prescribed. Taking less pills or less frequently is permitted and encouraged, when it comes to controlled substances (written prescriptions).  6. Inform other Doctors: Always inform, all of your healthcare providers, of all the medications you take. 7. Pain Medication from other Providers: You are not allowed to accept any additional pain medication from any other Doctor or Healthcare provider. There are two exceptions to this rule. (see below) In the event that you require additional pain medication, you are responsible for notifying us, as stated below. 8. Medication Agreement: You are responsible for carefully reading and following our Medication Agreement. This must be signed  before receiving any prescriptions from our practice. Safely store a copy of your signed Agreement. Violations to the Agreement will result in no further prescriptions. (Additional copies of our Medication Agreement are available upon request.) 9. Laws, Rules, & Regulations: All patients are expected to follow all Federal and [employer], [employer], Rules, [employer]. Ignorance of the Laws does not constitute a valid excuse. The use of any illegal substances is prohibited. 10. Adopted CDC guidelines & recommendations: Target dosing levels will be at or below 60 MME/day. Use of benzodiazepines** is not recommended.  Exceptions: There are only two exceptions to the rule of not receiving pain medications from other Healthcare Providers. 1. Exception #1 (Emergencies): In the event of an emergency (i.e.: accident requiring emergency care), you are allowed to receive additional pain medication. However, you are responsible for: As soon as you are able, call our office [phone], at any time of the day or night, and leave a message stating your name, the date and nature of the emergency, and the name and dose of the medication prescribed. In the event that your call is answered by a member of our staff, make sure to document and save the date, time, and the name of the person that took your information.  2. Exception #2 (Planned Surgery): In the event that you are scheduled by another doctor or dentist to have any type of surgery or procedure, you are allowed (for a period no longer than 30 days), to receive additional pain medication, for the acute post-op pain. However, in this case, you are responsible for picking up a copy of our "Post-op Pain Management for Surgeons" handout, and giving it to your surgeon or dentist. This document is available at our office, and does not require an appointment to obtain it. Simply go to our office during business hours (Monday-Thursday from 8:00 AM to 4:00 PM) (  Friday 8:00  AM to 12:00 Noon) or if you have a scheduled appointment with [name], prior to your surgery, and ask for it by name. In addition, you will need to provide [name] with your name, name of your surgeon, type of surgery, and date of procedure or surgery.  *Opioid medications include: morphine, codeine, oxycodone, oxymorphone, hydrocodone, hydromorphone, meperidine, tramadol, tapentadol, buprenorphine, fentanyl, methadone. **Benzodiazepine medications include: diazepam (Valium), alprazolam (Xanax), clonazepam (Klonopine), lorazepam (Ativan), clorazepate (Tranxene), chlordiazepoxide (Librium), estazolam (Prosom), oxazepam (Serax), temazepam (Restoril), triazolam (Halcion) (Last updated: 05/22/2017) ____________________________________________________________________________________________

## 2017-10-06 ENCOUNTER — Telehealth: Payer: Self-pay

## 2017-10-06 NOTE — Telephone Encounter (Signed)
Pt called and needs prior auth for Lyrica

## 2017-10-06 NOTE — Telephone Encounter (Signed)
[facility] and to please check with her pharmacy to see if it will go through

## 2017-10-14 ENCOUNTER — Telehealth: Payer: Self-pay | Admitting: *Deleted

## 2017-10-14 NOTE — Telephone Encounter (Signed)
Need to do PA for Lyrica

## 2017-10-27 ENCOUNTER — Other Ambulatory Visit: Payer: Self-pay | Admitting: Nurse Practitioner

## 2017-10-27 ENCOUNTER — Encounter: Payer: Medicaid Other | Admitting: Nurse Practitioner

## 2017-10-29 ENCOUNTER — Encounter: Payer: Self-pay | Admitting: Nurse Practitioner

## 2017-10-29 ENCOUNTER — Other Ambulatory Visit: Payer: Self-pay

## 2017-10-29 ENCOUNTER — Ambulatory Visit: Payer: Medicaid Other | Attending: Nurse Practitioner | Admitting: Nurse Practitioner

## 2017-10-29 VITALS — BP 157/108 | HR 103 | Temp 97.6°F | Ht 59.0 in | Wt 123.0 lb

## 2017-10-29 DIAGNOSIS — Z79899 Other long term (current) drug therapy: Secondary | ICD-10-CM | POA: Diagnosis not present

## 2017-10-29 DIAGNOSIS — Z882 Allergy status to sulfonamides status: Secondary | ICD-10-CM | POA: Insufficient documentation

## 2017-10-29 DIAGNOSIS — M5116 Intervertebral disc disorders with radiculopathy, lumbar region: Secondary | ICD-10-CM | POA: Diagnosis not present

## 2017-10-29 DIAGNOSIS — E119 Type 2 diabetes mellitus without complications: Secondary | ICD-10-CM | POA: Diagnosis not present

## 2017-10-29 DIAGNOSIS — I10 Essential (primary) hypertension: Secondary | ICD-10-CM | POA: Diagnosis not present

## 2017-10-29 DIAGNOSIS — M5416 Radiculopathy, lumbar region: Secondary | ICD-10-CM

## 2017-10-29 DIAGNOSIS — K760 Fatty (change of) liver, not elsewhere classified: Secondary | ICD-10-CM | POA: Diagnosis not present

## 2017-10-29 DIAGNOSIS — Z8249 Family history of ischemic heart disease and other diseases of the circulatory system: Secondary | ICD-10-CM | POA: Diagnosis not present

## 2017-10-29 DIAGNOSIS — Z833 Family history of diabetes mellitus: Secondary | ICD-10-CM | POA: Diagnosis not present

## 2017-10-29 DIAGNOSIS — J449 Chronic obstructive pulmonary disease, unspecified: Secondary | ICD-10-CM | POA: Insufficient documentation

## 2017-10-29 DIAGNOSIS — M7918 Myalgia, other site: Secondary | ICD-10-CM | POA: Diagnosis not present

## 2017-10-29 DIAGNOSIS — F17210 Nicotine dependence, cigarettes, uncomplicated: Secondary | ICD-10-CM | POA: Insufficient documentation

## 2017-10-29 DIAGNOSIS — M5442 Lumbago with sciatica, left side: Secondary | ICD-10-CM

## 2017-10-29 DIAGNOSIS — Z9889 Other specified postprocedural states: Secondary | ICD-10-CM | POA: Insufficient documentation

## 2017-10-29 DIAGNOSIS — E782 Mixed hyperlipidemia: Secondary | ICD-10-CM | POA: Insufficient documentation

## 2017-10-29 DIAGNOSIS — Z79891 Long term (current) use of opiate analgesic: Secondary | ICD-10-CM | POA: Insufficient documentation

## 2017-10-29 DIAGNOSIS — Z7982 Long term (current) use of aspirin: Secondary | ICD-10-CM | POA: Diagnosis not present

## 2017-10-29 DIAGNOSIS — Z7984 Long term (current) use of oral hypoglycemic drugs: Secondary | ICD-10-CM | POA: Diagnosis not present

## 2017-10-29 DIAGNOSIS — M160 Bilateral primary osteoarthritis of hip: Secondary | ICD-10-CM | POA: Insufficient documentation

## 2017-10-29 DIAGNOSIS — F329 Major depressive disorder, single episode, unspecified: Secondary | ICD-10-CM | POA: Diagnosis not present

## 2017-10-29 DIAGNOSIS — K219 Gastro-esophageal reflux disease without esophagitis: Secondary | ICD-10-CM | POA: Insufficient documentation

## 2017-10-29 DIAGNOSIS — G894 Chronic pain syndrome: Secondary | ICD-10-CM | POA: Diagnosis present

## 2017-10-29 DIAGNOSIS — M5441 Lumbago with sciatica, right side: Secondary | ICD-10-CM

## 2017-10-29 DIAGNOSIS — M797 Fibromyalgia: Secondary | ICD-10-CM | POA: Diagnosis not present

## 2017-10-29 DIAGNOSIS — Z888 Allergy status to other drugs, medicaments and biological substances status: Secondary | ICD-10-CM | POA: Insufficient documentation

## 2017-10-29 DIAGNOSIS — G8929 Other chronic pain: Secondary | ICD-10-CM

## 2017-10-29 MED ORDER — HYDROCODONE-ACETAMINOPHEN 5-325 MG PO TABS: 1.0000 | ORAL_TABLET | Freq: Three times a day (TID) | ORAL | 0 refills | Status: DC | PRN

## 2017-10-29 NOTE — Progress Notes (Addendum)
Patient's Name: [name]  MRN: [id number]  Referring Provider: Sharyne Peach, MD  DOB: [birth year]  PCP: Sharyne Peach, MD  DOS: 10/29/2017  Note by: Vevelyn Francois NP  Service setting: Ambulatory outpatient  Specialty: Interventional Pain Management  Location: [facility] (AMB) Pain Management Facility    Patient type: Established    Primary Reason(s) for Visit: Encounter for prescription drug management. (Level of risk: moderate)  CC: Knee Pain  HPI  [name] is a 57 y.o. year old, female patient, who comes today for a medication management evaluation. She has Essential hypertension; Diabetes ([place]); Fibromyalgia; COPD (chronic obstructive pulmonary disease) ([place]); Diverticulitis; Lumbar radiculopathy; Chronic bilateral low back pain with bilateral sciatica; Chronic pain syndrome; Chronic, continuous use of opioids; Chronic myofascial pain; Benign paroxysmal positional vertigo; Cardiac murmur, unspecified; DDD (degenerative disc disease), lumbar; Depression; Fatty liver disease, nonalcoholic; Primary osteoarthritis of both hips; Hyperlipidemia, mixed; Smoker; and Type 2 diabetes mellitus without complications ([place]) on their problem list. Her primarily concern today is the Knee Pain  Pain Assessment: Location: Right, Left Knee Radiating: Pain radites down to foot Onset: More than a month ago Duration: Chronic pain Quality: Constant, Aching, Nagging Severity: 5 /10 (subjective, self-reported pain score)  Note: Reported level is compatible with observation.                          Effect on ADL: limits my daily activities Timing: Constant Modifying factors: medications and rest BP: (!) 157/108  HR: (!) 103  [name] was last scheduled for an appointment on 7/57/2019 for medication management. During today's appointment we reviewed [name]'s chronic pain status, as well as her outpatient medication regimen. She admits that she was able to get the Lyrica. She  admits that she had numbness and tingling. She admits that her leg pain seems "calmer". She admits that she is on some medications and is not sure if she needs them.   The patient  reports that she does not use drugs. Her body mass index is 24.84 kg/m.  Further details on both, my assessment(s), as well as the proposed treatment plan, please see below.  Controlled Substance Pharmacotherapy Assessment REMS (Risk Evaluation and Mitigation Strategy)  Analgesic:Hydrocodone 5 mg TID prn MME/day:61m/day.    BChauncey Fischer RN  10/29/2017 10:55 AM  Signed Nursing Pain Medication Assessment:  Safety precautions to be maintained throughout the outpatient stay will include: orient to surroundings, keep bed in low position, maintain call bell within reach at all times, provide assistance with transfer out of bed and ambulation.  Medication Inspection Compliance: Pill count conducted under aseptic conditions, in front of the patient. Neither the pills nor the bottle was removed from the patient's sight at any time. Once count was completed pills were immediately returned to the patient in their original bottle.  Medication: Hydrocodone/APAP Pill/Patch Count: 52 of 90 pills remain Pill/Patch Appearance: Markings consistent with prescribed medication Bottle Appearance: Standard pharmacy container. Clearly labeled. Filled Date: 7 / 22 / 2019 Last Medication intake:  Today   Pharmacokinetics: Liberation and absorption (onset of action): WNL Distribution (time to peak effect): WNL Metabolism and excretion (duration of action): WNL         Pharmacodynamics: Desired effects: Analgesia: Ms. CGermanoreports >50% benefit. Functional ability: Patient reports that medication allows her to accomplish basic ADLs Clinically meaningful improvement in function (CMIF): Sustained CMIF goals met Perceived effectiveness: Described as relatively effective, allowing for increase in  activities of daily living  (ADL) Undesirable effects: Side-effects or Adverse reactions: None reported Monitoring: [place] PMP: Online review of the past 64-monthperiod conducted. Compliant with practice rules and regulations Last UDS on record: Summary  Date Value Ref Range Status  07/14/2017 FINAL  Final    Comment:    ==================================================================== TOXASSURE SELECT 13 (MW) ==================================================================== Test                             Result       Flag       Units Drug Present and Declared for Prescription Verification   Hydrocodone                    1416         EXPECTED   ng/mg creat   Hydromorphone                  359          EXPECTED   ng/mg creat   Dihydrocodeine                 388          EXPECTED   ng/mg creat   Norhydrocodone                 1402         EXPECTED   ng/mg creat    Sources of hydrocodone include scheduled prescription    medications. Hydromorphone, dihydrocodeine and norhydrocodone are    expected metabolites of hydrocodone. Hydromorphone and    dihydrocodeine are also available as scheduled prescription    medications. ==================================================================== Test                      Result    Flag   Units      Ref Range   Creatinine              131              mg/dL      >=20 ==================================================================== Declared Medications:  The flagging and interpretation on this report are based on the  following declared medications.  Unexpected results may arise from  inaccuracies in the declared medications.  **Note: The testing scope of this panel includes these medications:  Hydrocodone (Hydrocodone-Acetaminophen)  **Note: The testing scope of this panel does not include following  reported medications:  Acetaminophen (Hydrocodone-Acetaminophen)  Albuterol  Aspirin (Aspirin 81)  Citalopram  Cyclobenzaprine  Fluticasone  Gabapentin   Lisinopril  Meclizine  Meloxicam  Metformin  Mirtazapine  Nicotine  Pantoprazole  Polyethylene Glycol  Ranitidine  Rizatriptan  Rosuvastatin  Simethicone  Sucralfate  Topiramate  Trazodone ==================================================================== For clinical consultation, please call ([phone] ====================================================================    UDS interpretation: Compliant          Medication Assessment Form: Reviewed. Patient indicates being compliant with therapy Treatment compliance: Compliant Risk Assessment Profile: Aberrant behavior: See prior evaluations. None observed or detected today Comorbid factors increasing risk of overdose: See prior notes. No additional risks detected today Risk of substance use disorder (SUD): Low Opioid Risk Tool - 10/29/17 0944      Family History of Substance Abuse   Alcohol  Positive Female    Illegal Drugs  Negative    Rx Drugs  Negative      Personal History of Substance Abuse   Alcohol  Negative    Illegal  Drugs  Negative    Rx Drugs  Negative      Total Score   Opioid Risk Tool Scoring  3    Opioid Risk Interpretation  Low Risk      ORT Scoring interpretation table:  Score <3 = Low Risk for SUD  Score between 4-7 = Moderate Risk for SUD  Score >8 = High Risk for Opioid Abuse   Risk Mitigation Strategies:  Patient Counseling: Covered Patient-Prescriber Agreement (PPA): Present and active  Notification to other healthcare providers: Done  Pharmacologic Plan: No change in therapy, at this time.             Laboratory Chemistry  Inflammation Markers (CRP: Acute Phase) (ESR: Chronic Phase) Lab Results  Component Value Date   LATICACIDVEN 1.6 10/14/2016                         Rheumatology Markers No results found for: RF, ANA, LABURIC, URICUR, LYMEIGGIGMAB, LYMEABIGMQN, HLAB27                      Renal Function Markers Lab Results  Component Value Date   BUN 14 10/15/2016    CREATININE 0.60 06/26/2017   BCR 13 05/02/2016   GFRAA >60 10/15/2016   GFRNONAA >60 10/15/2016                             Hepatic Function Markers Lab Results  Component Value Date   AST 16 10/14/2016   ALT 16 10/14/2016   ALBUMIN 4.5 10/14/2016   ALKPHOS 66 10/14/2016   LIPASE 24 10/14/2016                        Electrolytes Lab Results  Component Value Date   NA 138 10/15/2016   K 4.4 10/15/2016   CL 107 10/15/2016   CALCIUM 8.3 (L) 10/15/2016                        Neuropathy Markers Lab Results  Component Value Date   HGBA1C 10.4 (H) 05/02/2016   HIV Non Reactive 10/15/2016                        Bone Pathology Markers No results found for: [place], [id number], [id number], OE4235TI1, 25OHVITD1, 25OHVITD2, 25OHVITD3, TESTOFREE, TESTOSTERONE                       Coagulation Parameters Lab Results  Component Value Date   PLT 182 10/15/2016                        Cardiovascular Markers Lab Results  Component Value Date   TROPONINI <0.03 10/16/2016   HGB 12.1 10/15/2016   HCT 37.1 10/15/2016                         CA Markers No results found for: CEA, CA125, LABCA2                      Note: Lab results reviewed.  Recent Diagnostic Imaging Results  CT ABDOMEN PELVIS W CONTRAST CLINICAL DATA:  Left-sided abdominal pain despite antibiotic treatment  EXAM: CT ABDOMEN AND PELVIS WITH CONTRAST  TECHNIQUE: Multidetector CT imaging of the abdomen and pelvis  was performed using the standard protocol following bolus administration of intravenous contrast.  CONTRAST:  177m ISOVUE-300 IOPAMIDOL (ISOVUE-300) INJECTION 61%  COMPARISON:  CT abdomen pelvis of 10/14/2016  FINDINGS: Lower chest: The lung bases are clear. Cardiomegaly is stable. No pericardial effusion is seen.  Hepatobiliary: The liver enhances with no focal abnormality and no ductal dilatation is seen. The left lobe of liver is somewhat prominent but stable compared to the prior CT.  A small single gallstone within the gallbladder cannot be excluded.  Pancreas: The pancreas is normal in size and the pancreatic duct is not dilated.  Spleen: The spleen is unremarkable.  Adrenals/Urinary Tract: The adrenal glands appear normal. The kidneys enhance with no calculus or mass and on delayed images, no hydronephrosis is seen. The ureters are normal in caliber. The urinary bladder is moderately well distended with no abnormality noted.  Stomach/Bowel: The stomach is moderately distended with oral contrast and food debris. No abnormality of small bowel is seen. There are a few scattered rectosigmoid and descending colon diverticula present with a few scattered throughout the more proximal colon as well. A moderate to large amount of feces is noted throughout the entire colon. The terminal ileum is well opacified with no abnormality. There is no present evidence of diverticulitis.  Vascular/Lymphatic: The abdominal aorta is normal in caliber with moderate abdominal aortic atherosclerosis present. No adenopathy is seen.  Reproductive: The uterus has previously been resected. No adnexal lesion is seen. No fluid is noted within the pelvis.  Other: No abdominal wall hernia is seen.  Musculoskeletal: The lumbar vertebrae are in normal alignment with normal intervertebral disc spaces. The SI joints are corticated.  IMPRESSION: 1. Although there are scattered colonic diverticula, there is no present evidence of diverticulitis. The appendix and terminal ileum also are unremarkable. 2. There is a moderately large amount of feces throughout the colon. 3. Cannot exclude a single small gallstone within the gallbladder. 4. Moderate abdominal aortic atherosclerosis.  Electronically Signed   By: PIvar DrapeM.D.   On: 06/26/2017 14:14  Complexity Note: Imaging results reviewed. Results shared with Ms. Romero, using Layman's terms.                         Meds    Current Outpatient Medications:  .  albuterol (PROVENTIL HFA;VENTOLIN HFA) 108 (90 Base) MCG/ACT inhaler, Inhale 1 puff into the lungs 2 (two) times daily as needed., Disp: , Rfl:  .  aspirin 81 MG tablet, Take 81 mg by mouth daily. , Disp: , Rfl:  .  cetirizine (ZYRTEC) 10 MG chewable tablet, Chew 10 mg by mouth daily., Disp: , Rfl:  .  cyclobenzaprine (FLEXERIL) 10 MG tablet, Take 1 tablet (10 mg total) by mouth 3 (three) times daily., Disp: 30 tablet, Rfl: 0 .  dicyclomine (BENTYL) 10 MG capsule, Take 10 mg by mouth 4 (four) times daily -  before meals and at bedtime., Disp: , Rfl:  .  fluticasone (FLONASE) 50 MCG/ACT nasal spray, Place 1 spray into both nostrils daily., Disp: , Rfl:  .  linaclotide (LINZESS) 290 MCG CAPS capsule, Take 290 mcg by mouth daily before breakfast., Disp: , Rfl:  .  lisinopril (PRINIVIL,ZESTRIL) 10 MG tablet, Take 10 mg by mouth daily. , Disp: , Rfl:  .  meloxicam (MOBIC) 15 MG tablet, Take 1 tablet (15 mg total) by mouth daily., Disp: 30 tablet, Rfl: 0 .  metFORMIN (GLUCOPHAGE) 1000 MG tablet, Take 1 tablet by  mouth 2 (two) times daily., Disp: , Rfl:  .  mirtazapine (REMERON) 15 MG tablet, Take 15 mg by mouth at bedtime., Disp: , Rfl:  .  pantoprazole (PROTONIX) 40 MG tablet, Take 40 mg by mouth daily., Disp: , Rfl:  .  polyethylene glycol (MIRALAX / GLYCOLAX) packet, Take 17 g by mouth daily., Disp: , Rfl:  .  pregabalin (LYRICA) 25 MG capsule, Take 1 capsule (25 mg total) by mouth 3 (three) times daily., Disp: 90 capsule, Rfl: 0 .  ranitidine (ZANTAC) 300 MG tablet, Take 300 mg by mouth at bedtime., Disp: , Rfl:  .  rizatriptan (MAXALT) 10 MG tablet, Take 10 mg by mouth as needed for migraine. May repeat in 2 hours if needed, Disp: , Rfl:  .  rosuvastatin (CRESTOR) 20 MG tablet, Take 20 mg by mouth daily., Disp: , Rfl:  .  sucralfate (CARAFATE) 1 g tablet, Take 1 g by mouth 4 (four) times daily -  with meals and at bedtime., Disp: , Rfl:  .  topiramate  (TOPAMAX) 25 MG tablet, Take 25 mg by mouth 2 (two) times daily., Disp: , Rfl:  .  traZODone (DESYREL) 100 MG tablet, Take 1 tablet by mouth at bedtime as needed., Disp: , Rfl:  .  [START ON 11/12/2017] HYDROcodone-acetaminophen (NORCO/VICODIN) 5-325 MG tablet, Take 1 tablet by mouth 3 (three) times daily as needed. For chronic pain To last for 30 days from fill date, Disp: 90 tablet, Rfl: 0  ROS  Constitutional: Denies any fever or chills Gastrointestinal: No reported hemesis, hematochezia, vomiting, or acute GI distress Musculoskeletal: Denies any acute onset joint swelling, redness, loss of ROM, or weakness Neurological: No reported episodes of acute onset apraxia, aphasia, dysarthria, agnosia, amnesia, paralysis, loss of coordination, or loss of consciousness  Allergies  Ms. Rojek is allergic to tramadol; benadryl [diphenhydramine hcl]; bextra [valdecoxib]; invokana [canagliflozin]; and sulfamethoxazole-trimethoprim.  PFSH  Drug: Ms. Stitzer  reports that she does not use drugs. Alcohol:  reports that she does not drink alcohol. Tobacco:  reports that she has been smoking cigarettes.  She has been smoking about 0.50 packs per day. She uses smokeless tobacco. Medical:  has a past medical history of Asthma, Asthma, BPPV (benign paroxysmal positional vertigo), Cardiac murmur, unspecified (08/28/2016), Degenerative disc disease, lumbar, Depression, Diabetes mellitus, Fatty liver, Fibromyalgia, Fibromyalgia, GERD (gastroesophageal reflux disease), History of degenerative disc disease, Hyperlipidemia, and Hypertension. Surgical: Ms. Kimbler  has a past surgical history that includes Abdominal surgery; Abdominal hysterectomy; Esophagogastroduodenoscopy (egd) with propofol (N/A, 02/20/2017); Colonoscopy with propofol (N/A, 02/20/2017); Upper esophageal endoscopic ultrasound (eus) (N/A, 04/03/2017); and Diverticulitis. Family: family history includes Diabetes in her mother; Hypertension in  her mother.  Constitutional Exam  General appearance: Well nourished, well developed, and well hydrated. In no apparent acute distress Vitals:   10/29/17 0927  BP: (!) 157/108  Pulse: (!) 103  Temp: 97.6 F (36.4 C)  SpO2: 97%  Weight: 123 lb (55.8 kg)  Height: _0  (1.499 m)   BMI Assessment: Estimated body mass index is 24.84 kg/m as calculated from the following:   Height as of this encounter: _1  (1.499 m).   Weight as of this encounter: 123 lb (55.8 kg). Psych/Mental status: Alert, oriented x 3 (person, place, & time)       Eyes: PERLA Respiratory: No evidence of acute respiratory distress  Lumbar Spine Area Exam  Skin & Axial Inspection: No masses, redness, or swelling Alignment: Symmetrical Functional ROM: Unrestricted ROM  Stability: No instability detected Muscle Tone/Strength: Functionally intact. No obvious neuro-muscular anomalies detected. Sensory (Neurological): Unimpaired Palpation: No palpable anomalies       Provocative Tests: Hyperextension/rotation test: deferred today       Lumbar quadrant test (Kemp's test): deferred today       Lateral bending test: deferred today       Patrick's Maneuver: deferred today                   FABER test: deferred today                   S-I anterior distraction/compression test: deferred today         S-I lateral compression test: deferred today         S-I Thigh-thrust test: deferred today         S-I Gaenslen's test: deferred today           Gait & Posture Assessment  Ambulation: Unassisted Gait: Relatively normal for age and body habitus Posture: WNL   Lower Extremity Exam    Side: Right lower extremity  Side: Left lower extremity  Stability: No instability observed          Stability: No instability observed          Skin & Extremity Inspection: Skin color, temperature, and hair growth are WNL. No peripheral edema or cyanosis. No masses, redness, swelling, asymmetry, or associated skin lesions. No  contractures.  Skin & Extremity Inspection: Skin color, temperature, and hair growth are WNL. No peripheral edema or cyanosis. No masses, redness, swelling, asymmetry, or associated skin lesions. No contractures.  Functional ROM: Unrestricted ROM                  Functional ROM: Unrestricted ROM                  Muscle Tone/Strength: Functionally intact. No obvious neuro-muscular anomalies detected.  Muscle Tone/Strength: Functionally intact. No obvious neuro-muscular anomalies detected.  Sensory (Neurological): Unimpaired  Sensory (Neurological): Unimpaired  Palpation: No palpable anomalies  Palpation: No palpable anomalies   Assessment  Primary Diagnosis & Pertinent Problem List: The primary encounter diagnosis was Chronic bilateral low back pain with bilateral sciatica. Diagnoses of Lumbar radiculopathy, Chronic myofascial pain, and Chronic pain syndrome were also pertinent to this visit.  Status Diagnosis  Persistent Persistent Controlled 1. Chronic bilateral low back pain with bilateral sciatica   2. Lumbar radiculopathy   3. Chronic myofascial pain   4. Chronic pain syndrome     Problems updated and reviewed during this visit: No problems updated. Plan of Care  Pharmacotherapy (Medications Ordered): Meds ordered this encounter  Medications  . HYDROcodone-acetaminophen (NORCO/VICODIN) 5-325 MG tablet    Sig: Take 1 tablet by mouth 3 (three) times daily as needed. For chronic pain To last for 30 days from fill date    Dispense:  90 tablet    Refill:  0    May fill on: 11/12/2017 To last until: 12/12/2017    Order Specific Question:   Supervising Provider    Answer:   Milinda Pointer [phone]   New Prescriptions   No medications on file   Medications administered today: Breya Cass. Rochin had no medications administered during this visit. Lab-work, procedure(s), and/or referral(s): No orders of the defined types were placed in this encounter.  Imaging and/or  referral(s): None  Interventional therapies: Planned, scheduled, and/or pending:   Not at this time. She will hold off  the Meloxicam and Cyclobenzaprine. She may go back on this if spasms or joint pain returns     Provider-requested follow-up: Return in about 1 month (around 11/26/2017) for MedMgmt with Me Donella Stade [facility]).  Future Appointments  Date Time Provider [place]  12/01/2017 10:30 AM Vevelyn Francois, NP [facility] None   Primary Care Physician: Sharyne Peach, MD Location: [facility] Note by: Vevelyn Francois NP Date: 10/29/2017; Time: 10:56 AM  Pain Score Disclaimer: We use the NRS-11 scale. This is a self-reported, subjective measurement of pain severity with only modest accuracy. It is used primarily to identify changes within a particular patient. It must be understood that outpatient pain scales are significantly less accurate that those used for research, where they can be applied under ideal controlled circumstances with minimal exposure to variables. In reality, the score is likely to be a combination of pain intensity and pain affect, where pain affect describes the degree of emotional arousal or changes in action readiness caused by the sensory experience of pain. Factors such as social and work situation, setting, emotional state, anxiety levels, expectation, and prior pain experience may influence pain perception and show large inter-individual differences that may also be affected by time variables.  Patient instructions provided during this appointment: Patient Instructions  ____________________________________________________________________________________________  Medication Rules  Applies to: All patients receiving prescriptions (written or electronic).  Pharmacy of record: Pharmacy where electronic prescriptions will be sent. If written prescriptions are taken to a different pharmacy, please inform the nursing staff. The  pharmacy listed in the electronic medical record should be the one where you would like electronic prescriptions to be sent.  Prescription refills: Only during scheduled appointments. Applies to both, written and electronic prescriptions.  NOTE: The following applies primarily to controlled substances (Opioid* Pain Medications).   Patient's responsibilities: 1. Pain Pills: Bring all pain pills to every appointment (except for procedure appointments). 2. Pill Bottles: Bring pills in original pharmacy bottle. Always bring newest bottle. Bring bottle, even if empty. 3. Medication refills: You are responsible for knowing and keeping track of what medications you need refilled. The day before your appointment, write a list of all prescriptions that need to be refilled. Bring that list to your appointment and give it to the admitting nurse. Prescriptions will be written only during appointments. If you forget a medication, it will not be "Called in", "Faxed", or "electronically sent". You will need to get another appointment to get these prescribed. 4. Prescription Accuracy: You are responsible for carefully inspecting your prescriptions before leaving our office. Have the discharge nurse carefully go over each prescription with you, before taking them home. Make sure that your name is accurately spelled, that your address is correct. Check the name and dose of your medication to make sure it is accurate. Check the number of pills, and the written instructions to make sure they are clear and accurate. Make sure that you are given enough medication to last until your next medication refill appointment. 5. Taking Medication: Take medication as prescribed. Never take more pills than instructed. Never take medication more frequently than prescribed. Taking less pills or less frequently is permitted and encouraged, when it comes to controlled substances (written prescriptions).  6. Inform other Doctors: Always  inform, all of your healthcare providers, of all the medications you take. 7. Pain Medication from other Providers: You are not allowed to accept any additional pain medication from any other Doctor or Healthcare provider. There are two  exceptions to this rule. (see below) In the event that you require additional pain medication, you are responsible for notifying us, as stated below. 8. Medication Agreement: You are responsible for carefully reading and following our Medication Agreement. This must be signed before receiving any prescriptions from our practice. Safely store a copy of your signed Agreement. Violations to the Agreement will result in no further prescriptions. (Additional copies of our Medication Agreement are available upon request.) 9. Laws, Rules, & Regulations: All patients are expected to follow all Federal and [employer], [employer], Rules, [employer]. Ignorance of the Laws does not constitute a valid excuse. The use of any illegal substances is prohibited. 10. Adopted CDC guidelines & recommendations: Target dosing levels will be at or below 60 MME/day. Use of benzodiazepines** is not recommended.  Exceptions: There are only two exceptions to the rule of not receiving pain medications from other Healthcare Providers. 1. Exception #1 (Emergencies): In the event of an emergency (i.e.: accident requiring emergency care), you are allowed to receive additional pain medication. However, you are responsible for: As soon as you are able, call our office [phone], at any time of the day or night, and leave a message stating your name, the date and nature of the emergency, and the name and dose of the medication prescribed. In the event that your call is answered by a member of our staff, make sure to document and save the date, time, and the name of the person that took your information.  2. Exception #2 (Planned Surgery): In the event that you are scheduled by another doctor or dentist to  have any type of surgery or procedure, you are allowed (for a period no longer than 30 days), to receive additional pain medication, for the acute post-op pain. However, in this case, you are responsible for picking up a copy of our "Post-op Pain Management for Surgeons" handout, and giving it to your surgeon or dentist. This document is available at our office, and does not require an appointment to obtain it. Simply go to our office during business hours (Monday-Thursday from 8:00 AM to 4:00 PM) (Friday 8:00 AM to 12:00 Noon) or if you have a scheduled appointment with [name], prior to your surgery, and ask for it by name. In addition, you will need to provide [name] with your name, name of your surgeon, type of surgery, and date of procedure or surgery.  *Opioid medications include: morphine, codeine, oxycodone, oxymorphone, hydrocodone, hydromorphone, meperidine, tramadol, tapentadol, buprenorphine, fentanyl, methadone. **Benzodiazepine medications include: diazepam (Valium), alprazolam (Xanax), clonazepam (Klonopine), lorazepam (Ativan), clorazepate (Tranxene), chlordiazepoxide (Librium), estazolam (Prosom), oxazepam (Serax), temazepam (Restoril), triazolam (Halcion) (Last updated: 05/22/2017) ____________________________________________________________________________________________

## 2017-10-29 NOTE — Progress Notes (Signed)
Nursing Pain Medication Assessment:  Safety precautions to be maintained throughout the outpatient stay will include: orient to surroundings, keep bed in low position, maintain call bell within reach at all times, provide assistance with transfer out of bed and ambulation.  Medication Inspection Compliance: Pill count conducted under aseptic conditions, in front of the patient. Neither the pills nor the bottle was removed from the patient's sight at any time. Once count was completed pills were immediately returned to the patient in their original bottle.  Medication: Hydrocodone/APAP Pill/Patch Count: 52 of 90 pills remain Pill/Patch Appearance: Markings consistent with prescribed medication Bottle Appearance: Standard pharmacy container. Clearly labeled. Filled Date: 7 / 22 / 2019 Last Medication intake:  Today

## 2017-10-29 NOTE — Patient Instructions (Signed)
____________________________________________________________________________________________  Medication Rules  Applies to: All patients receiving prescriptions (written or electronic).  Pharmacy of record: Pharmacy where electronic prescriptions will be sent. If written prescriptions are taken to a different pharmacy, please inform the nursing staff. The pharmacy listed in the electronic medical record should be the one where you would like electronic prescriptions to be sent.  Prescription refills: Only during scheduled appointments. Applies to both, written and electronic prescriptions.  NOTE: The following applies primarily to controlled substances (Opioid* Pain Medications).   Patient's responsibilities: 1. Pain Pills: Bring all pain pills to every appointment (except for procedure appointments). 2. Pill Bottles: Bring pills in original pharmacy bottle. Always bring newest bottle. Bring bottle, even if empty. 3. Medication refills: You are responsible for knowing and keeping track of what medications you need refilled. The day before your appointment, write a list of all prescriptions that need to be refilled. Bring that list to your appointment and give it to the admitting nurse. Prescriptions will be written only during appointments. If you forget a medication, it will not be "Called in", "Faxed", or "electronically sent". You will need to get another appointment to get these prescribed. 4. Prescription Accuracy: You are responsible for carefully inspecting your prescriptions before leaving our office. Have the discharge nurse carefully go over each prescription with you, before taking them home. Make sure that your name is accurately spelled, that your address is correct. Check the name and dose of your medication to make sure it is accurate. Check the number of pills, and the written instructions to make sure they are clear and accurate. Make sure that you are given enough medication to last  until your next medication refill appointment. 5. Taking Medication: Take medication as prescribed. Never take more pills than instructed. Never take medication more frequently than prescribed. Taking less pills or less frequently is permitted and encouraged, when it comes to controlled substances (written prescriptions).  6. Inform other Doctors: Always inform, all of your healthcare providers, of all the medications you take. 7. Pain Medication from other Providers: You are not allowed to accept any additional pain medication from any other Doctor or Healthcare provider. There are two exceptions to this rule. (see below) In the event that you require additional pain medication, you are responsible for notifying us, as stated below. 8. Medication Agreement: You are responsible for carefully reading and following our Medication Agreement. This must be signed before receiving any prescriptions from our practice. Safely store a copy of your signed Agreement. Violations to the Agreement will result in no further prescriptions. (Additional copies of our Medication Agreement are available upon request.) 9. Laws, Rules, & Regulations: All patients are expected to follow all Federal and State Laws, Statutes, Rules, & Regulations. Ignorance of the Laws does not constitute a valid excuse. The use of any illegal substances is prohibited. 10. Adopted CDC guidelines & recommendations: Target dosing levels will be at or below 60 MME/day. Use of benzodiazepines** is not recommended.  Exceptions: There are only two exceptions to the rule of not receiving pain medications from other Healthcare Providers. 1. Exception #1 (Emergencies): In the event of an emergency (i.e.: accident requiring emergency care), you are allowed to receive additional pain medication. However, you are responsible for: As soon as you are able, call our office [phone], at any time of the day or night, and leave a message stating your name, the  date and nature of the emergency, and the name and dose of the medication   prescribed. In the event that your call is answered by a member of our staff, make sure to document and save the date, time, and the name of the person that took your information.  2. Exception #2 (Planned Surgery): In the event that you are scheduled by another doctor or dentist to have any type of surgery or procedure, you are allowed (for a period no longer than 30 days), to receive additional pain medication, for the acute post-op pain. However, in this case, you are responsible for picking up a copy of our "Post-op Pain Management for Surgeons" handout, and giving it to your surgeon or dentist. This document is available at our office, and does not require an appointment to obtain it. Simply go to our office during business hours (Monday-Thursday from 8:00 AM to 4:00 PM) (Friday 8:00 AM to 12:00 Noon) or if you have a scheduled appointment with us, prior to your surgery, and ask for it by name. In addition, you will need to provide us with your name, name of your surgeon, type of surgery, and date of procedure or surgery.  *Opioid medications include: morphine, codeine, oxycodone, oxymorphone, hydrocodone, hydromorphone, meperidine, tramadol, tapentadol, buprenorphine, fentanyl, methadone. **Benzodiazepine medications include: diazepam (Valium), alprazolam (Xanax), clonazepam (Klonopine), lorazepam (Ativan), clorazepate (Tranxene), chlordiazepoxide (Librium), estazolam (Prosom), oxazepam (Serax), temazepam (Restoril), triazolam (Halcion) (Last updated: 05/22/2017) ____________________________________________________________________________________________    

## 2017-11-11 ENCOUNTER — Other Ambulatory Visit: Payer: Self-pay | Admitting: Gastroenterology

## 2017-11-11 DIAGNOSIS — R1011 Right upper quadrant pain: Secondary | ICD-10-CM

## 2017-11-14 ENCOUNTER — Telehealth: Payer: Self-pay

## 2017-11-14 NOTE — Telephone Encounter (Signed)
Received request for EUS. EUS scheduled for 12/25/17 with Dr. Chevis PrettySpaete at [facility]. Belen[place]ent over instructions and copy mailed to home address along with my contact information for any future questions.  INSTRUCTIONS FOR ENDOSCOPIC ULTRASOUND -Your procedure has been scheduled for October 3rd with Dr. Chevis PrettySpaete at [facility]. -The hospital may contact you to pre-register over the phone.  -To get your scheduled arrival time, please call the Endoscopy unit at  [phone] between 1-3 p.m. on:  October 2nd    -ON THE DAY OF YOU PROCEDURE:   1. If you are scheduled for a morning procedure, nothing to drink after midnight  -If you are scheduled for an afternoon procedure, you may have clear liquids until 5 hours prior  to the procedure but no carbonated drinks or broth  2. NO FOOD THE DAY OF YOUR PROCEDURE  3. You may take your heart, seizure, blood pressure, Parkinson's or breathing medications at  6am with just enough water to get your pills down  4. Do not take any oral Diabetic medications the morning of your procedure.  5. If you are a diabetic and are using insulin, please notify your prescribing physician of this  procedure, as your dose may need to be altered related to not being able to eat or drink.   6. Do not take vitamins, iron, or fish oil for 5 days before your procedure     -On the day of your procedure, come to the [facility] Admitting[facility]Registration desk (First desk on the right) at the scheduled arrival time. You MUST have someone drive you home from your procedure. You must have a responsible adult with a valid driver's license who is on site throughout your entire procedure and who can stay with you for several hours after your procedure. You may not go home alone in a taxi, shuttle [employer] or bus, as the drivers will not be responsible for you.  --If you have any questions please call me at the above contact Oncology Nurse Navigator  Documentation  Navigator Location: [facility] (11/14/17 1400)   )Navigator Encounter Type: Telephone (11/14/17 1400) Telephone: Outgoing Call (11/14/17 1400)                       Barriers/Navigation Needs: Coordination of Care (11/14/17 1400)   Interventions: Coordination of Care (11/14/17 1400)   Coordination of Care: EUS (11/14/17 1400)                  Time Spent with Patient: 30 (11/14/17 1400)

## 2017-11-26 DIAGNOSIS — I700 Atherosclerosis of aorta: Secondary | ICD-10-CM | POA: Insufficient documentation

## 2017-11-27 ENCOUNTER — Ambulatory Visit: Payer: Medicaid Other | Attending: Nurse Practitioner | Admitting: Nurse Practitioner

## 2017-11-27 ENCOUNTER — Other Ambulatory Visit: Payer: Self-pay

## 2017-11-27 ENCOUNTER — Encounter: Payer: Self-pay | Admitting: Nurse Practitioner

## 2017-11-27 VITALS — BP 131/85 | HR 107 | Temp 97.8°F | Resp 18 | Ht 59.0 in | Wt 120.0 lb

## 2017-11-27 DIAGNOSIS — Z79891 Long term (current) use of opiate analgesic: Secondary | ICD-10-CM

## 2017-11-27 DIAGNOSIS — M797 Fibromyalgia: Secondary | ICD-10-CM | POA: Insufficient documentation

## 2017-11-27 DIAGNOSIS — Z789 Other specified health status: Secondary | ICD-10-CM

## 2017-11-27 DIAGNOSIS — K219 Gastro-esophageal reflux disease without esophagitis: Secondary | ICD-10-CM | POA: Insufficient documentation

## 2017-11-27 DIAGNOSIS — G894 Chronic pain syndrome: Secondary | ICD-10-CM | POA: Diagnosis present

## 2017-11-27 DIAGNOSIS — E119 Type 2 diabetes mellitus without complications: Secondary | ICD-10-CM | POA: Diagnosis not present

## 2017-11-27 DIAGNOSIS — M160 Bilateral primary osteoarthritis of hip: Secondary | ICD-10-CM | POA: Insufficient documentation

## 2017-11-27 DIAGNOSIS — M5116 Intervertebral disc disorders with radiculopathy, lumbar region: Secondary | ICD-10-CM | POA: Diagnosis not present

## 2017-11-27 DIAGNOSIS — E782 Mixed hyperlipidemia: Secondary | ICD-10-CM | POA: Diagnosis not present

## 2017-11-27 DIAGNOSIS — F329 Major depressive disorder, single episode, unspecified: Secondary | ICD-10-CM | POA: Insufficient documentation

## 2017-11-27 DIAGNOSIS — J449 Chronic obstructive pulmonary disease, unspecified: Secondary | ICD-10-CM | POA: Insufficient documentation

## 2017-11-27 DIAGNOSIS — F17210 Nicotine dependence, cigarettes, uncomplicated: Secondary | ICD-10-CM | POA: Diagnosis not present

## 2017-11-27 DIAGNOSIS — Z7984 Long term (current) use of oral hypoglycemic drugs: Secondary | ICD-10-CM | POA: Diagnosis not present

## 2017-11-27 DIAGNOSIS — M5441 Lumbago with sciatica, right side: Secondary | ICD-10-CM

## 2017-11-27 DIAGNOSIS — M5416 Radiculopathy, lumbar region: Secondary | ICD-10-CM | POA: Diagnosis not present

## 2017-11-27 DIAGNOSIS — M899 Disorder of bone, unspecified: Secondary | ICD-10-CM

## 2017-11-27 DIAGNOSIS — I700 Atherosclerosis of aorta: Secondary | ICD-10-CM | POA: Diagnosis not present

## 2017-11-27 DIAGNOSIS — H8110 Benign paroxysmal vertigo, unspecified ear: Secondary | ICD-10-CM | POA: Insufficient documentation

## 2017-11-27 DIAGNOSIS — I10 Essential (primary) hypertension: Secondary | ICD-10-CM | POA: Insufficient documentation

## 2017-11-27 DIAGNOSIS — K760 Fatty (change of) liver, not elsewhere classified: Secondary | ICD-10-CM | POA: Insufficient documentation

## 2017-11-27 DIAGNOSIS — Z79899 Other long term (current) drug therapy: Secondary | ICD-10-CM

## 2017-11-27 DIAGNOSIS — M5442 Lumbago with sciatica, left side: Secondary | ICD-10-CM | POA: Diagnosis not present

## 2017-11-27 DIAGNOSIS — M7918 Myalgia, other site: Secondary | ICD-10-CM | POA: Diagnosis not present

## 2017-11-27 DIAGNOSIS — G8929 Other chronic pain: Secondary | ICD-10-CM

## 2017-11-27 DIAGNOSIS — K5730 Diverticulosis of large intestine without perforation or abscess without bleeding: Secondary | ICD-10-CM | POA: Diagnosis not present

## 2017-11-27 DIAGNOSIS — Z7982 Long term (current) use of aspirin: Secondary | ICD-10-CM | POA: Insufficient documentation

## 2017-11-27 MED ORDER — HYDROCODONE-ACETAMINOPHEN 5-325 MG PO TABS: 1.0000 | ORAL_TABLET | Freq: Three times a day (TID) | ORAL | 0 refills | Status: DC | PRN

## 2017-11-27 MED ORDER — PREGABALIN 25 MG PO CAPS: 25.0000 mg | ORAL_CAPSULE | Freq: Three times a day (TID) | ORAL | 0 refills | Status: DC

## 2017-11-27 NOTE — Patient Instructions (Addendum)
_you have been given e-prescription for Hydrocodone to last until 01/11/18 and also for Lyrica ___________________________________________________________________________________________  Medication Rules  Applies to: All patients receiving prescriptions (written or electronic).  Pharmacy of record: Pharmacy where electronic prescriptions will be sent. If written prescriptions are taken to a different pharmacy, please inform the nursing staff. The pharmacy listed in the electronic medical record should be the one where you would like electronic prescriptions to be sent.  Prescription refills: Only during scheduled appointments. Applies to both, written and electronic prescriptions.  NOTE: The following applies primarily to controlled substances (Opioid* Pain Medications).   Patient's responsibilities: 1. Pain Pills: Bring all pain pills to every appointment (except for procedure appointments). 2. Pill Bottles: Bring pills in original pharmacy bottle. Always bring newest bottle. Bring bottle, even if empty. 3. Medication refills: You are responsible for knowing and keeping track of what medications you need refilled. The day before your appointment, write a list of all prescriptions that need to be refilled. Bring that list to your appointment and give it to the admitting nurse. Prescriptions will be written only during appointments. If you forget a medication, it will not be "Called in", "Faxed", or "electronically sent". You will need to get another appointment to get these prescribed. 4. Prescription Accuracy: You are responsible for carefully inspecting your prescriptions before leaving our office. Have the discharge nurse carefully go over each prescription with you, before taking them home. Make sure that your name is accurately spelled, that your address is correct. Check the name and dose of your medication to make sure it is accurate. Check the number of pills, and the written instructions to  make sure they are clear and accurate. Make sure that you are given enough medication to last until your next medication refill appointment. 5. Taking Medication: Take medication as prescribed. Never take more pills than instructed. Never take medication more frequently than prescribed. Taking less pills or less frequently is permitted and encouraged, when it comes to controlled substances (written prescriptions).  6. Inform other Doctors: Always inform, all of your healthcare providers, of all the medications you take. 7. Pain Medication from other Providers: You are not allowed to accept any additional pain medication from any other Doctor or Healthcare provider. There are two exceptions to this rule. (see below) In the event that you require additional pain medication, you are responsible for notifying us, as stated below. 8. Medication Agreement: You are responsible for carefully reading and following our Medication Agreement. This must be signed before receiving any prescriptions from our practice. Safely store a copy of your signed Agreement. Violations to the Agreement will result in no further prescriptions. (Additional copies of our Medication Agreement are available upon request.) 9. Laws, Rules, & Regulations: All patients are expected to follow all [address] and [employer], Rules, [employer]. Ignorance of the Laws does not constitute a valid excuse. The use of any illegal substances is prohibited. 10. Adopted CDC guidelines & recommendations: Target dosing levels will be at or below 60 MME/day. Use of benzodiazepines** is not recommended.  Exceptions: There are only two exceptions to the rule of not receiving pain medications from other Healthcare Providers. 1. Exception #1 (Emergencies): In the event of an emergency (i.e.: accident requiring emergency care), you are allowed to receive additional pain medication. However, you are responsible for: As soon as you are able, call our  office [phone], at any time of the day or night, and leave a message stating your name,  the date and nature of the emergency, and the name and dose of the medication prescribed. In the event that your call is answered by a member of our staff, make sure to document and save the date, time, and the name of the person that took your information.  2. Exception #2 (Planned Surgery): In the event that you are scheduled by another doctor or dentist to have any type of surgery or procedure, you are allowed (for a period no longer than 30 days), to receive additional pain medication, for the acute post-op pain. However, in this case, you are responsible for picking up a copy of our "Post-op Pain Management for Surgeons" handout, and giving it to your surgeon or dentist. This document is available at our office, and does not require an appointment to obtain it. Simply go to our office during business hours (Monday-Thursday from 8:00 AM to 4:00 PM) (Friday 8:00 AM to 12:00 Noon) or if you have a scheduled appointment with [name], prior to your surgery, and ask for it by name. In addition, you will need to provide [name] with your name, name of your surgeon, type of surgery, and date of procedure or surgery.  *Opioid medications include: morphine, codeine, oxycodone, oxymorphone, hydrocodone, hydromorphone, meperidine, tramadol, tapentadol, buprenorphine, fentanyl, methadone. **Benzodiazepine medications include: diazepam (Valium), alprazolam (Xanax), clonazepam (Klonopine), lorazepam (Ativan), clorazepate (Tranxene), chlordiazepoxide (Librium), estazolam (Prosom), oxazepam (Serax), temazepam (Restoril), triazolam (Halcion) (Last updated: 05/22/2017) ____________________________________________________________________________________________

## 2017-11-27 NOTE — Progress Notes (Signed)
Patient's Name: [name]  MRN: [id number]  Referring Provider: Sharyne Peach, MD  DOB: 30-May-1960  PCP: Sharyne Peach, MD  DOS: 11/27/2017  Note by: Vevelyn Francois NP  Service setting: Ambulatory outpatient  Specialty: Interventional Pain Management  Location: [facility] (AMB) Pain Management Facility    Patient type: Established    Primary Reason(s) for Visit: Encounter for prescription drug management. (Level of risk: moderate)  CC: Back Pain (low); Hip Pain (bilateral); Leg Pain (bilateral); and Fibromyalgia  HPI  [name] is a 57 y.o. year old, female patient, who comes today for a medication management evaluation. She has Essential hypertension; Diabetes ([place]); Fibromyalgia; COPD (chronic obstructive pulmonary disease) ([place]); Diverticulitis; Lumbar radiculopathy; Chronic bilateral low back pain with bilateral sciatica; Chronic pain syndrome; Chronic, continuous use of opioids; Chronic myofascial pain; Benign paroxysmal positional vertigo; Cardiac murmur, unspecified; DDD (degenerative disc disease), lumbar; Depression; Fatty liver disease, nonalcoholic; Primary osteoarthritis of both hips; Hyperlipidemia, mixed; Smoker; Type 2 diabetes mellitus without complications ([place]); Aortic atherosclerosis ([place]); Pharmacologic therapy; Disorder of skeletal system; and Problems influencing health status on their problem list. Her primarily concern today is the Back Pain (low); Hip Pain (bilateral); Leg Pain (bilateral); and Fibromyalgia  Pain Assessment: Location: Lower Back Radiating: legs and hips Onset: More than a month ago Duration: Chronic pain Quality: Constant, Aching, Nagging Severity: 7 /10 (subjective, self-reported pain score)  Note: Reported level is compatible with observation. Clinically the patient looks like a 1/10 A 1/10 is viewed as "Mild" and described as nagging, annoying, but not interfering with basic activities of daily living (ADL). [name] is able to  eat, bathe, get dressed, do toileting (being able to get on and off the toilet and perform personal hygiene functions), transfer (move in and out of bed or a chair without assistance), and maintain continence (able to control bladder and bowel functions). Physiologic parameters such as blood pressure and heart rate apear wnl. [name] does not seem to understand the use of our objective pain scale When using our objective Pain Scale, levels between 6 and 10/10 are said to belong in an emergency room, as it progressively worsens from a 6/10, described as severely limiting, requiring emergency care not usually available at an outpatient pain management facility. At a 6/10 level, communication becomes difficult and requires great effort. Assistance to reach the emergency department may be required. Facial flushing and profuse sweating along with potentially dangerous increases in heart rate and blood pressure will be evident. Effect on ADL:   Timing: Constant Modifying factors: medications and rest BP: 131/85  HR: (!) 107  [name] was last scheduled for an appointment on 10/29/2017 for medication management. During today's appointment we reviewed [name]'s chronic pain status, as well as her outpatient medication regimen.  She admits that she is Lyrica.  She felt like she was having some increased hand pain.  She denies any additional new medication.  She denies any swelling.  She feels like her overall pain is being managed with the hydrocodone.  She admits that she takes it as directed and denies any side effects.  The patient  reports that she does not use drugs. Her body mass index is 24.24 kg/m.  Further details on both, my assessment(s), as well as the proposed treatment plan, please see below.  Controlled Substance Pharmacotherapy Assessment REMS (Risk Evaluation and Mitigation Strategy)  Analgesic:Hydrocodone 5 mg TID prn MME/day:16m/day. SHart Rochester RN   11/27/2017 11:00 AM  Sign at  close encounter Nursing Pain Medication Assessment:  Safety precautions to be maintained throughout the outpatient stay will include: orient to surroundings, keep bed in low position, maintain call bell within reach at all times, provide assistance with transfer out of bed and ambulation.  Medication Inspection Compliance: Pill count conducted under aseptic conditions, in front of the patient. Neither the pills nor the bottle was removed from the patient's sight at any time. Once count was completed pills were immediately returned to the patient in their original bottle.  Medication: Hydrocodone/APAP Pill/Patch Count: 70 of 90 pills remain Pill/Patch Appearance: Markings consistent with prescribed medication Bottle Appearance: Standard pharmacy container. Clearly labeled. Filled Date: 08 / 19 / 2019 Last Medication intake:  Today   Pharmacokinetics: Liberation and absorption (onset of action): WNL Distribution (time to peak effect): WNL Metabolism and excretion (duration of action): WNL         Pharmacodynamics: Desired effects: Analgesia: [name] reports >50% benefit. Functional ability: Patient reports that medication allows her to accomplish basic ADLs Clinically meaningful improvement in function (CMIF): Sustained CMIF goals met Perceived effectiveness: Described as relatively effective, allowing for increase in activities of daily living (ADL) Undesirable effects: Side-effects or Adverse reactions: None reported Monitoring: [place] PMP: Online review of the past 87-monthperiod conducted. Compliant with practice rules and regulations Last UDS on record: Summary  Date Value Ref Range Status  07/14/2017 FINAL  Final    Comment:    ==================================================================== TOXASSURE SELECT 13 (MW) ==================================================================== Test                             Result       Flag        Units Drug Present and Declared for Prescription Verification   Hydrocodone                    1416         EXPECTED   ng/mg creat   Hydromorphone                  359          EXPECTED   ng/mg creat   Dihydrocodeine                 388          EXPECTED   ng/mg creat   Norhydrocodone                 1402         EXPECTED   ng/mg creat    Sources of hydrocodone include scheduled prescription    medications. Hydromorphone, dihydrocodeine and norhydrocodone are    expected metabolites of hydrocodone. Hydromorphone and    dihydrocodeine are also available as scheduled prescription    medications. ==================================================================== Test                      Result    Flag   Units      Ref Range   Creatinine              131              mg/dL      >=20 ==================================================================== Declared Medications:  The flagging and interpretation on this report are based on the  following declared medications.  Unexpected results may arise from  inaccuracies in the declared medications.  **Note: The testing scope of this panel  includes these medications:  Hydrocodone (Hydrocodone-Acetaminophen)  **Note: The testing scope of this panel does not include following  reported medications:  Acetaminophen (Hydrocodone-Acetaminophen)  Albuterol  Aspirin (Aspirin 81)  Citalopram  Cyclobenzaprine  Fluticasone  Gabapentin  Lisinopril  Meclizine  Meloxicam  Metformin  Mirtazapine  Nicotine  Pantoprazole  Polyethylene Glycol  Ranitidine  Rizatriptan  Rosuvastatin  Simethicone  Sucralfate  Topiramate  Trazodone ==================================================================== For clinical consultation, please call [phone]. ====================================================================    UDS interpretation: Compliant          Medication Assessment Form: Reviewed. Patient indicates being compliant with  therapy Treatment compliance: Compliant Risk Assessment Profile: Aberrant behavior: See prior evaluations. None observed or detected today Comorbid factors increasing risk of overdose: history of substance use disorder Risk of substance use disorder (SUD): High  ORT Scoring interpretation table:  Score <3 = Low Risk for SUD  Score between 4-7 = Moderate Risk for SUD  Score >8 = High Risk for Opioid Abuse   Risk Mitigation Strategies:  Patient Counseling: Covered Patient-Prescriber Agreement (PPA): Present and active  Notification to other healthcare providers: Done  Pharmacologic Plan: No change in therapy, at this time.             Laboratory Chemistry  Inflammation Markers (CRP: Acute Phase) (ESR: Chronic Phase) Lab Results  Component Value Date   LATICACIDVEN 1.6 10/14/2016                         Rheumatology Markers No results found for: RF, ANA, LABURIC, URICUR, LYMEIGGIGMAB, LYMEABIGMQN, HLAB27                      Renal Function Markers Lab Results  Component Value Date   BUN 14 10/15/2016   CREATININE 0.60 06/26/2017   BCR 13 05/02/2016   GFRAA >60 10/15/2016   GFRNONAA >60 10/15/2016                             Hepatic Function Markers Lab Results  Component Value Date   AST 16 10/14/2016   ALT 16 10/14/2016   ALBUMIN 4.5 10/14/2016   ALKPHOS 66 10/14/2016   LIPASE 24 10/14/2016                        Electrolytes Lab Results  Component Value Date   NA 138 10/15/2016   K 4.4 10/15/2016   CL 107 10/15/2016   CALCIUM 8.3 (L) 10/15/2016                        Neuropathy Markers Lab Results  Component Value Date   HGBA1C 10.4 (H) 05/02/2016   HIV Non Reactive 10/15/2016                        CNS Tests No results found for: SDES, GRAMSTAIN, CULT, COLORCSF, APPEARCSF, RBCCOUNTCSF, WBCCSF, POLYSCSF, LYMPHSCSF, EOSCSF, PROTEINCSF, GLUCCSF, JCVIRUS, CSFOLI, IGGCSF, IGGALB, IGGIND                      Bone Pathology Markers No results found for:  VD25OH, [id number], PV3748OL0, [id number], 25OHVITD1, 25OHVITD2, 25OHVITD3, TESTOFREE, TESTOSTERONE                       Coagulation Parameters Lab Results  Component Value Date   PLT  182 10/15/2016                        Cardiovascular Markers Lab Results  Component Value Date   TROPONINI <0.03 10/16/2016   HGB 12.1 10/15/2016   HCT 37.1 10/15/2016                         CA Markers No results found for: CEA, CA125, LABCA2                      Note: Lab results reviewed.  Recent Diagnostic Imaging Results  CT ABDOMEN PELVIS W CONTRAST CLINICAL DATA:  Left-sided abdominal pain despite antibiotic treatment  EXAM: CT ABDOMEN AND PELVIS WITH CONTRAST  TECHNIQUE: Multidetector CT imaging of the abdomen and pelvis was performed using the standard protocol following bolus administration of intravenous contrast.  CONTRAST:  112m ISOVUE-300 IOPAMIDOL (ISOVUE-300) INJECTION 61%  COMPARISON:  CT abdomen pelvis of 10/14/2016  FINDINGS: Lower chest: The lung bases are clear. Cardiomegaly is stable. No pericardial effusion is seen.  Hepatobiliary: The liver enhances with no focal abnormality and no ductal dilatation is seen. The left lobe of liver is somewhat prominent but stable compared to the prior CT. A small single gallstone within the gallbladder cannot be excluded.  Pancreas: The pancreas is normal in size and the pancreatic duct is not dilated.  Spleen: The spleen is unremarkable.  Adrenals/Urinary Tract: The adrenal glands appear normal. The kidneys enhance with no calculus or mass and on delayed images, no hydronephrosis is seen. The ureters are normal in caliber. The urinary bladder is moderately well distended with no abnormality noted.  Stomach/Bowel: The stomach is moderately distended with oral contrast and food debris. No abnormality of small bowel is seen. There are a few scattered rectosigmoid and descending colon diverticula present with a few  scattered throughout the more proximal colon as well. A moderate to large amount of feces is noted throughout the entire colon. The terminal ileum is well opacified with no abnormality. There is no present evidence of diverticulitis.  Vascular/Lymphatic: The abdominal aorta is normal in caliber with moderate abdominal aortic atherosclerosis present. No adenopathy is seen.  Reproductive: The uterus has previously been resected. No adnexal lesion is seen. No fluid is noted within the pelvis.  Other: No abdominal wall hernia is seen.  Musculoskeletal: The lumbar vertebrae are in normal alignment with normal intervertebral disc spaces. The SI joints are corticated.  IMPRESSION: 1. Although there are scattered colonic diverticula, there is no present evidence of diverticulitis. The appendix and terminal ileum also are unremarkable. 2. There is a moderately large amount of feces throughout the colon. 3. Cannot exclude a single small gallstone within the gallbladder. 4. Moderate abdominal aortic atherosclerosis.  Electronically Signed   By: PIvar DrapeM.D.   On: 06/26/2017 14:14  Complexity Note: Imaging results reviewed. Results shared with Ms. Tagliaferro, using Layman's terms.                         Meds   Current Outpatient Medications:  .  albuterol (PROVENTIL HFA;VENTOLIN HFA) 108 (90 Base) MCG/ACT inhaler, Inhale 1 puff into the lungs 2 (two) times daily as needed., Disp: , Rfl:  .  aspirin 81 MG tablet, Take 81 mg by mouth daily. , Disp: , Rfl:  .  cetirizine (ZYRTEC) 10 MG chewable tablet, Chew 10 mg by mouth daily., Disp: ,  Rfl:  .  cyclobenzaprine (FLEXERIL) 10 MG tablet, Take 1 tablet (10 mg total) by mouth 3 (three) times daily., Disp: 30 tablet, Rfl: 0 .  dicyclomine (BENTYL) 10 MG capsule, Take 10 mg by mouth 4 (four) times daily -  before meals and at bedtime., Disp: , Rfl:  .  fluticasone (FLONASE) 50 MCG/ACT nasal spray, Place 1 spray into both nostrils daily.,  Disp: , Rfl:  .  [START ON 12/12/2017] HYDROcodone-acetaminophen (NORCO/VICODIN) 5-325 MG tablet, Take 1 tablet by mouth 3 (three) times daily as needed. For chronic pain To last for 30 days from fill date, Disp: 90 tablet, Rfl: 0 .  linaclotide (LINZESS) 290 MCG CAPS capsule, Take 290 mcg by mouth daily before breakfast., Disp: , Rfl:  .  lisinopril (PRINIVIL,ZESTRIL) 10 MG tablet, Take 10 mg by mouth daily. , Disp: , Rfl:  .  metFORMIN (GLUCOPHAGE) 1000 MG tablet, Take 1 tablet by mouth 2 (two) times daily., Disp: , Rfl:  .  mirtazapine (REMERON) 15 MG tablet, Take 15 mg by mouth at bedtime., Disp: , Rfl:  .  pantoprazole (PROTONIX) 40 MG tablet, Take 40 mg by mouth daily., Disp: , Rfl:  .  polyethylene glycol (MIRALAX / GLYCOLAX) packet, Take 17 g by mouth daily., Disp: , Rfl:  .  [START ON 12/12/2017] pregabalin (LYRICA) 25 MG capsule, Take 1 capsule (25 mg total) by mouth 3 (three) times daily., Disp: 90 capsule, Rfl: 0 .  ranitidine (ZANTAC) 300 MG tablet, Take 300 mg by mouth at bedtime., Disp: , Rfl:  .  rizatriptan (MAXALT) 10 MG tablet, Take 10 mg by mouth as needed for migraine. May repeat in 2 hours if needed, Disp: , Rfl:  .  rosuvastatin (CRESTOR) 20 MG tablet, Take 20 mg by mouth daily., Disp: , Rfl:  .  sucralfate (CARAFATE) 1 g tablet, Take 1 g by mouth 4 (four) times daily -  with meals and at bedtime., Disp: , Rfl:  .  topiramate (TOPAMAX) 25 MG tablet, Take 25 mg by mouth 2 (two) times daily., Disp: , Rfl:  .  traZODone (DESYREL) 100 MG tablet, Take 1 tablet by mouth at bedtime as needed., Disp: , Rfl:   ROS  Constitutional: Denies any fever or chills Gastrointestinal: No reported hemesis, hematochezia, vomiting, or acute GI distress Musculoskeletal: Denies any acute onset joint swelling, redness, loss of ROM, or weakness Neurological: No reported episodes of acute onset apraxia, aphasia, dysarthria, agnosia, amnesia, paralysis, loss of coordination, or loss of  consciousness  Allergies  Ms. Bolz is allergic to tramadol; benadryl [diphenhydramine hcl]; bextra [valdecoxib]; invokana [canagliflozin]; and sulfamethoxazole-trimethoprim.  PFSH  Drug: Ms. Tippins  reports that she does not use drugs. Alcohol:  reports that she does not drink alcohol. Tobacco:  reports that she has been smoking cigarettes. She has been smoking about 0.50 packs per day. She uses smokeless tobacco. Medical:  has a past medical history of Asthma, Asthma, BPPV (benign paroxysmal positional vertigo), Cardiac murmur, unspecified (08/28/2016), Degenerative disc disease, lumbar, Depression, Diabetes mellitus, Fatty liver, Fibromyalgia, Fibromyalgia, GERD (gastroesophageal reflux disease), History of degenerative disc disease, Hyperlipidemia, and Hypertension. Surgical: Ms. Hickmon  has a past surgical history that includes Abdominal surgery; Abdominal hysterectomy; Esophagogastroduodenoscopy (egd) with propofol (N/A, 02/20/2017); Colonoscopy with propofol (N/A, 02/20/2017); Upper esophageal endoscopic ultrasound (eus) (N/A, 04/03/2017); and Diverticulitis. Family: family history includes Diabetes in her mother; Hypertension in her mother.  Constitutional Exam  General appearance: Well nourished, well developed, and well hydrated. In no apparent acute distress  Vitals:   11/27/17 1054  BP: 131/85  Pulse: (!) 107  Resp: 18  Temp: 97.8 F (36.6 C)  TempSrc: Oral  SpO2: 98%  Weight: 120 lb (54.4 kg)  Height: '4\' 11"'  (1.499 m)   BMI Assessment: Estimated body mass index is 24.24 kg/m as calculated from the following:   Height as of this encounter: '4\' 11"'  (1.499 m).   Weight as of this encounter: 120 lb (54.4 kg). Psych/Mental status: Alert, oriented x 3 (person, place, & time)       Eyes: PERLA Respiratory: No evidence of acute respiratory distress  Cervical Spine Area Exam  Skin & Axial Inspection: No masses, redness, edema, swelling, or associated skin  lesions Alignment: Symmetrical Functional ROM: Unrestricted ROM      Stability: No instability detected Muscle Tone/Strength: Functionally intact. No obvious neuro-muscular anomalies detected. Sensory (Neurological): Unimpaired Palpation: No palpable anomalies              Upper Extremity (UE) Exam    Side: Right upper extremity  Side: Left upper extremity  Skin & Extremity Inspection: Skin color, temperature, and hair growth are WNL. No peripheral edema or cyanosis. No masses, redness, swelling, asymmetry, or associated skin lesions. No contractures.  Skin & Extremity Inspection: Skin color, temperature, and hair growth are WNL. No peripheral edema or cyanosis. No masses, redness, swelling, asymmetry, or associated skin lesions. No contractures.  Functional ROM: Adequate ROM          Functional ROM: Adequate ROM          Muscle Tone/Strength: Functionally intact. No obvious neuro-muscular anomalies detected.  Muscle Tone/Strength: Functionally intact. No obvious neuro-muscular anomalies detected.  Sensory (Neurological): Unimpaired          Sensory (Neurological): Unimpaired          Palpation: No palpable anomalies              Palpation: No palpable anomalies                Lumbar Spine Area Exam  Skin & Axial Inspection: No masses, redness, or swelling Alignment: Symmetrical Functional ROM: Unrestricted ROM       Stability: No instability detected Muscle Tone/Strength: Functionally intact. No obvious neuro-muscular anomalies detected. Sensory (Neurological): Unimpaired Palpation: Tender         Gait & Posture Assessment  Ambulation: Unassisted Gait: Relatively normal for age and body habitus Posture: WNL   Lower Extremity Exam    Side: Right lower extremity  Side: Left lower extremity  Stability: No instability observed          Stability: No instability observed          Skin & Extremity Inspection: Skin color, temperature, and hair growth are WNL. No peripheral edema or  cyanosis. No masses, redness, swelling, asymmetry, or associated skin lesions. No contractures.  Skin & Extremity Inspection: Skin color, temperature, and hair growth are WNL. No peripheral edema or cyanosis. No masses, redness, swelling, asymmetry, or associated skin lesions. No contractures.  Functional ROM: Unrestricted ROM                  Functional ROM: Unrestricted ROM                  Muscle Tone/Strength: Functionally intact. No obvious neuro-muscular anomalies detected.  Muscle Tone/Strength: Functionally intact. No obvious neuro-muscular anomalies detected.  Sensory (Neurological): Unimpaired  Sensory (Neurological): Unimpaired  Palpation: No palpable anomalies  Palpation: No palpable anomalies  Assessment  Primary Diagnosis & Pertinent Problem List: The primary encounter diagnosis was Lumbar radiculopathy. Diagnoses of Chronic bilateral low back pain with bilateral sciatica, Chronic myofascial pain, Chronic pain syndrome, Pharmacologic therapy, Disorder of skeletal system, Problems influencing health status, and Long term prescription opiate use were also pertinent to this visit.  Status Diagnosis  Controlled Controlled Controlled 1. Lumbar radiculopathy   2. Chronic bilateral low back pain with bilateral sciatica   3. Chronic myofascial pain   4. Chronic pain syndrome   5. Pharmacologic therapy   6. Disorder of skeletal system   7. Problems influencing health status   8. Long term prescription opiate use     Problems updated and reviewed during this visit: Problem  Pharmacologic Therapy  Disorder of Skeletal System  Problems Influencing Health Status  Aortic Atherosclerosis ([facility])   CT abdomen 06/2017 [facility]    Plan of Care  Pharmacotherapy (Medications Ordered): Meds ordered this encounter  Medications  . HYDROcodone-acetaminophen (NORCO/VICODIN) 5-325 MG tablet    Sig: Take 1 tablet by mouth 3 (three) times daily as needed. For chronic pain To last for 30 days from  fill date    Dispense:  90 tablet    Refill:  0    May fill on: 12/12/2017 To last until: 01/11/2018    Order Specific Question:   Supervising Provider    Answer:   Milinda Pointer [phone]  . pregabalin (LYRICA) 25 MG capsule    Sig: Take 1 capsule (25 mg total) by mouth 3 (three) times daily.    Dispense:  90 capsule    Refill:  0    Order Specific Question:   Supervising Provider    AnswerMilinda Pointer [phone]   New Prescriptions   No medications on file   Medications administered today: [facility]. [place] had no medications administered during this visit. Lab-work, procedure(s), and/or referral(s): Orders Placed This Encounter  Procedures  . Magnesium  . Sedimentation rate  . C-reactive protein  . Comp. Metabolic Panel (12)  . ToxASSURE Select 13 (MW), Urine   Imaging and/or referral(s): None  Interventional therapies: Planned, scheduled, and/or pending:   Not at this time.  Provider-requested follow-up: Return in about 4 weeks (around 12/25/2017) for MedMgmt with Me Donella Stade Edison Pace).  Future Appointments  Date Time Provider [place]  12/29/2017  8:30 AM Vevelyn Francois, NP [facility] None   Primary Care Physician: Sharyne Peach, MD Location: [facility] Outpatient Pain Management Facility Note by: Vevelyn Francois NP Date: 11/27/2017; Time: 4:13 PM  Pain Score Disclaimer: We use the NRS-11 scale. This is a self-reported, subjective measurement of pain severity with only modest accuracy. It is used primarily to identify changes within a particular patient. It must be understood that outpatient pain scales are significantly less accurate that those used for research, where they can be applied under ideal controlled circumstances with minimal exposure to variables. In reality, the score is likely to be a combination of pain intensity and pain affect, where pain affect describes the degree of emotional arousal or changes in action readiness caused by the  sensory experience of pain. Factors such as social and work situation, setting, emotional state, anxiety levels, expectation, and prior pain experience may influence pain perception and show large inter-individual differences that may also be affected by time variables.  Patient instructions provided during this appointment: Patient Instructions  _you have been given e-prescription for Hydrocodone to last until 01/11/18 and also for Lyrica ___________________________________________________________________________________________  Medication  Rules  Applies to: All patients receiving prescriptions (written or electronic).  Pharmacy of record: Pharmacy where electronic prescriptions will be sent. If written prescriptions are taken to a different pharmacy, please inform the nursing staff. The pharmacy listed in the electronic medical record should be the one where you would like electronic prescriptions to be sent.  Prescription refills: Only during scheduled appointments. Applies to both, written and electronic prescriptions.  NOTE: The following applies primarily to controlled substances (Opioid* Pain Medications).   Patient's responsibilities: 1. Pain Pills: Bring all pain pills to every appointment (except for procedure appointments). 2. Pill Bottles: Bring pills in original pharmacy bottle. Always bring newest bottle. Bring bottle, even if empty. 3. Medication refills: You are responsible for knowing and keeping track of what medications you need refilled. The day before your appointment, write a list of all prescriptions that need to be refilled. Bring that list to your appointment and give it to the admitting nurse. Prescriptions will be written only during appointments. If you forget a medication, it will not be "Called in", "Faxed", or "electronically sent". You will need to get another appointment to get these prescribed. 4. Prescription Accuracy: You are responsible for carefully  inspecting your prescriptions before leaving our office. Have the discharge nurse carefully go over each prescription with you, before taking them home. Make sure that your name is accurately spelled, that your address is correct. Check the name and dose of your medication to make sure it is accurate. Check the number of pills, and the written instructions to make sure they are clear and accurate. Make sure that you are given enough medication to last until your next medication refill appointment. 5. Taking Medication: Take medication as prescribed. Never take more pills than instructed. Never take medication more frequently than prescribed. Taking less pills or less frequently is permitted and encouraged, when it comes to controlled substances (written prescriptions).  6. Inform other Doctors: Always inform, all of your healthcare providers, of all the medications you take. 7. Pain Medication from other Providers: You are not allowed to accept any additional pain medication from any other Doctor or Healthcare provider. There are two exceptions to this rule. (see below) In the event that you require additional pain medication, you are responsible for notifying us, as stated below. 8. Medication Agreement: You are responsible for carefully reading and following our Medication Agreement. This must be signed before receiving any prescriptions from our practice. Safely store a copy of your signed Agreement. Violations to the Agreement will result in no further prescriptions. (Additional copies of our Medication Agreement are available upon request.) 9. Laws, Rules, & Regulations: All patients are expected to follow all Federal and [employer], Rules, [employer]. Ignorance of the Laws does not constitute a valid excuse. The use of any illegal substances is prohibited. 10. Adopted CDC guidelines & recommendations: Target dosing levels will be at or below 60 MME/day. Use of benzodiazepines** is not  recommended.  Exceptions: There are only two exceptions to the rule of not receiving pain medications from other Healthcare Providers. 1. Exception #1 (Emergencies): In the event of an emergency (i.e.: accident requiring emergency care), you are allowed to receive additional pain medication. However, you are responsible for: As soon as you are able, call our office [phone], at any time of the day or night, and leave a message stating your name, the date and nature of the emergency, and the name and dose of the medication prescribed. In the  event that your call is answered by a member of our staff, make sure to document and save the date, time, and the name of the person that took your information.  2. Exception #2 (Planned Surgery): In the event that you are scheduled by another doctor or dentist to have any type of surgery or procedure, you are allowed (for a period no longer than 30 days), to receive additional pain medication, for the acute post-op pain. However, in this case, you are responsible for picking up a copy of our "Post-op Pain Management for Surgeons" handout, and giving it to your surgeon or dentist. This document is available at our office, and does not require an appointment to obtain it. Simply go to our office during business hours (Monday-Thursday from 8:00 AM to 4:00 PM) (Friday 8:00 AM to 12:00 Noon) or if you have a scheduled appointment with [name], prior to your surgery, and ask for it by name. In addition, you will need to provide [name] with your name, name of your surgeon, type of surgery, and date of procedure or surgery.  *Opioid medications include: morphine, codeine, oxycodone, oxymorphone, hydrocodone, hydromorphone, meperidine, tramadol, tapentadol, buprenorphine, fentanyl, methadone. **Benzodiazepine medications include: diazepam (Valium), alprazolam (Xanax), clonazepam (Klonopine), lorazepam (Ativan), clorazepate (Tranxene), chlordiazepoxide (Librium), estazolam (Prosom),  oxazepam (Serax), temazepam (Restoril), triazolam (Halcion) (Last updated: 05/22/2017) ____________________________________________________________________________________________

## 2017-11-27 NOTE — Progress Notes (Signed)
Nursing Pain Medication Assessment:  Safety precautions to be maintained throughout the outpatient stay will include: orient to surroundings, keep bed in low position, maintain call bell within reach at all times, provide assistance with transfer out of bed and ambulation.  Medication Inspection Compliance: Pill count conducted under aseptic conditions, in front of the patient. Neither the pills nor the bottle was removed from the patient's sight at any time. Once count was completed pills were immediately returned to the patient in their original bottle.  Medication: Hydrocodone/APAP Pill/Patch Count: 70 of 90 pills remain Pill/Patch Appearance: Markings consistent with prescribed medication Bottle Appearance: Standard pharmacy container. Clearly labeled. Filled Date: 08 / 19 / 2019 Last Medication intake:  Today

## 2017-11-28 LAB — COMP. METABOLIC PANEL (12)
AST: 13 IU/L (ref 0–40)
Albumin/Globulin Ratio: 1.9 (ref 1.2–2.2)
Albumin: 4.5 g/dL (ref 3.5–5.5)
Alkaline Phosphatase: 112 IU/L (ref 39–117)
BUN / CREAT RATIO: 14 (ref 9–23)
BUN: 12 mg/dL (ref 6–24)
Bilirubin Total: <0.2 mg/dL (ref 0.0–1.2)
CALCIUM: 9.8 mg/dL (ref 8.7–10.2)
CREATININE: 0.88 mg/dL (ref 0.57–1.00)
Chloride: 102 mmol/L (ref 96–106)
GFR calc non Af Amer: 73 mL/min/{1.73_m2} (ref >59)
GFR, EST AFRICAN AMERICAN: 84 mL/min/{1.73_m2} (ref >59)
GLUCOSE: 257 mg/dL — AB (ref 65–99)
Globulin, Total: 2.4 g/dL (ref 1.5–4.5)
Potassium: 4.3 mmol/L (ref 3.5–5.2)
Sodium: 140 mmol/L (ref 134–144)
TOTAL PROTEIN: 6.9 g/dL (ref 6.0–8.5)

## 2017-11-28 LAB — MAGNESIUM: MAGNESIUM: 1.8 mg/dL (ref 1.6–2.3)

## 2017-11-28 LAB — SEDIMENTATION RATE: SED RATE: 17 mm/h (ref 0–40)

## 2017-11-28 LAB — C-REACTIVE PROTEIN

## 2017-12-01 ENCOUNTER — Telehealth: Payer: Self-pay | Admitting: *Deleted

## 2017-12-01 ENCOUNTER — Ambulatory Visit: Payer: Medicaid Other | Admitting: Nurse Practitioner

## 2017-12-01 LAB — TOXASSURE SELECT 13 (MW), URINE

## 2017-12-01 NOTE — Telephone Encounter (Signed)
Voicemail left with patient to let me know if she still needs PA for Lyrica, as Crystal thought this had already been approved.  Secondly, if she does to please call so I can get some additional information from her.

## 2017-12-02 ENCOUNTER — Telehealth: Payer: Self-pay

## 2017-12-02 ENCOUNTER — Telehealth: Payer: Self-pay | Admitting: *Deleted

## 2017-12-02 NOTE — Telephone Encounter (Signed)
Spoke with patient re; Lyrica PA to let her know that I sent that in yesterday via [employer].  She will check with her pharmacy this afternoon.

## 2017-12-02 NOTE — Telephone Encounter (Signed)
Pt returning call about Lyrica, Please call her back

## 2017-12-07 ENCOUNTER — Encounter: Payer: Self-pay | Admitting: Emergency Medicine

## 2017-12-07 ENCOUNTER — Other Ambulatory Visit: Payer: Self-pay

## 2017-12-07 ENCOUNTER — Emergency Department: Payer: Medicaid Other

## 2017-12-07 ENCOUNTER — Emergency Department
Admission: EM | Admit: 2017-12-07 | Discharge: 2017-12-07 | Disposition: A | Discharge status: Home / Self Care | Payer: Medicaid Other | Attending: Emergency Medicine | Admitting: Emergency Medicine

## 2017-12-07 DIAGNOSIS — Z7984 Long term (current) use of oral hypoglycemic drugs: Secondary | ICD-10-CM | POA: Diagnosis not present

## 2017-12-07 DIAGNOSIS — I10 Essential (primary) hypertension: Secondary | ICD-10-CM | POA: Insufficient documentation

## 2017-12-07 DIAGNOSIS — R1084 Generalized abdominal pain: Secondary | ICD-10-CM | POA: Insufficient documentation

## 2017-12-07 DIAGNOSIS — Z79899 Other long term (current) drug therapy: Secondary | ICD-10-CM | POA: Diagnosis not present

## 2017-12-07 DIAGNOSIS — R109 Unspecified abdominal pain: Secondary | ICD-10-CM

## 2017-12-07 DIAGNOSIS — J45909 Unspecified asthma, uncomplicated: Secondary | ICD-10-CM | POA: Insufficient documentation

## 2017-12-07 DIAGNOSIS — Z7982 Long term (current) use of aspirin: Secondary | ICD-10-CM | POA: Insufficient documentation

## 2017-12-07 DIAGNOSIS — F17210 Nicotine dependence, cigarettes, uncomplicated: Secondary | ICD-10-CM | POA: Diagnosis not present

## 2017-12-07 DIAGNOSIS — E119 Type 2 diabetes mellitus without complications: Secondary | ICD-10-CM | POA: Insufficient documentation

## 2017-12-07 LAB — CBC
HCT: 38.3 % (ref 35.0–47.0)
HEMOGLOBIN: 12.7 g/dL (ref 12.0–16.0)
MCH: 28.4 pg (ref 26.0–34.0)
MCHC: 33.3 g/dL (ref 32.0–36.0)
MCV: 85.4 fL (ref 80.0–100.0)
Platelets: 255 10*3/uL (ref 150–440)
RBC: 4.48 MIL/uL (ref 3.80–5.20)
RDW: 14.1 % (ref 11.5–14.5)
WBC: 10 10*3/uL (ref 3.6–11.0)

## 2017-12-07 LAB — URINALYSIS, COMPLETE (UACMP) WITH MICROSCOPIC
BACTERIA UA: NONE SEEN
BILIRUBIN URINE: NEGATIVE
GLUCOSE, UA: NEGATIVE mg/dL
HGB URINE DIPSTICK: NEGATIVE
Ketones, ur: NEGATIVE mg/dL
LEUKOCYTES UA: NEGATIVE
NITRITE: NEGATIVE
PROTEIN: NEGATIVE mg/dL
Specific Gravity, Urine: 1.018 (ref 1.005–1.030)
pH: 7 (ref 5.0–8.0)

## 2017-12-07 LAB — COMPREHENSIVE METABOLIC PANEL
ALK PHOS: 74 U/L (ref 38–126)
ALT: 28 U/L (ref 0–44)
ANION GAP: 7 (ref 5–15)
AST: 22 U/L (ref 15–41)
Albumin: 4 g/dL (ref 3.5–5.0)
BUN: 10 mg/dL (ref 6–20)
CALCIUM: 9.4 mg/dL (ref 8.9–10.3)
CO2: 24 mmol/L (ref 22–32)
CREATININE: 0.74 mg/dL (ref 0.44–1.00)
Chloride: 112 mmol/L — ABNORMAL HIGH (ref 98–111)
GFR calc non Af Amer: >60 mL/min (ref >60)
Glucose, Bld: 87 mg/dL (ref 70–99)
Potassium: 3.7 mmol/L (ref 3.5–5.1)
SODIUM: 143 mmol/L (ref 135–145)
TOTAL PROTEIN: 6.9 g/dL (ref 6.5–8.1)
Total Bilirubin: 0.5 mg/dL (ref 0.3–1.2)

## 2017-12-07 LAB — LIPASE, BLOOD: Lipase: 35 U/L (ref 11–51)

## 2017-12-07 NOTE — ED Triage Notes (Signed)
Pt to ED via POV c/o lower abdominal pain and left flank pain x 1 month, pt states that she hasn't been able to eat much over the last month either because eating makes the pain worse. Pt states that the pain is not any worse than it has been over the last month. Pt is in NAD at this time.

## 2017-12-07 NOTE — ED Provider Notes (Addendum)
[facility] Emergency Department Provider Note  Time seen: 2:10 PM  I have reviewed the triage vital signs and the nursing notes.   HISTORY  Chief Complaint Abdominal Pain and Flank Pain    HPI [name] is a 57 y.o. female with a past medical history of asthma, diabetes, fibromyalgia, hypertension, hyperlipidemia, gastric reflux, history of diverticulitis, presents to the emergency department for 1 month of abdominal pain.  According to the patient for the past 1 month she has been expensing abdominal pain mostly on the left side/left flank.  States she has been taking Bentyl as prescribed by her gastroenterologist but it has not been helping so she came to the emergency department.  Describes the pain as moderate aching type pain which has been fairly constant for the past 1 month.  States sometimes is worse when she eats, sometimes it is worse for no reason at all.  Does state slight dysuria as well denies any hematuria.  Denies any vaginal bleeding or discharge, status post hysterectomy.   Past Medical History:  Diagnosis Date  . Asthma   . Asthma   . BPPV (benign paroxysmal positional vertigo)   . Cardiac murmur, unspecified 08/28/2016  . Degenerative disc disease, lumbar   . Depression   . Diabetes mellitus   . Fatty liver   . Fibromyalgia   . Fibromyalgia   . GERD (gastroesophageal reflux disease)   . History of degenerative disc disease   . Hyperlipidemia   . Hypertension     Patient Active Problem List   Diagnosis Date Noted  . Pharmacologic therapy 11/27/2017  . Disorder of skeletal system 11/27/2017  . Problems influencing health status 11/27/2017  . Aortic atherosclerosis (HCC) 11/26/2017  . Depression 09/01/2017  . Smoker 09/01/2017  . Lumbar radiculopathy 05/29/2017  . Chronic bilateral low back pain with bilateral sciatica 05/29/2017  . Chronic pain syndrome 05/29/2017  . Chronic, continuous use of opioids 05/29/2017  .  Chronic myofascial pain 05/29/2017  . Primary osteoarthritis of both hips 03/11/2017  . Benign paroxysmal positional vertigo 11/08/2016  . Fatty liver disease, nonalcoholic 10/25/2016  . DDD (degenerative disc disease), lumbar 10/23/2016  . Diverticulitis 10/14/2016  . Cardiac murmur, unspecified 08/28/2016  . Hyperlipidemia, mixed 05/29/2016  . Type 2 diabetes mellitus without complications (HCC) 05/29/2016  . COPD (chronic obstructive pulmonary disease) (HCC) 01/02/2016  . Essential hypertension 05/15/2015  . Fibromyalgia 04/19/2015  . Diabetes (HCC) 02/08/2015    Past Surgical History:  Procedure Laterality Date  . ABDOMINAL HYSTERECTOMY    . ABDOMINAL SURGERY    . COLONOSCOPY WITH PROPOFOL N/A 02/20/2017   Procedure: COLONOSCOPY WITH PROPOFOL;  Surgeon: Christena DeemSkulskie, Martin U, MD;  Location: [facility] ENDOSCOPY;  Service: Endoscopy;  Laterality: N/A;  . Diverticulitis    . ESOPHAGOGASTRODUODENOSCOPY (EGD) WITH PROPOFOL N/A 02/20/2017   Procedure: ESOPHAGOGASTRODUODENOSCOPY (EGD) WITH PROPOFOL;  Surgeon: Christena DeemSkulskie, Martin U, MD;  [facility] END[facility]COPY;  Service: Endoscopy;  Laterality: N/A;  . UPPER ESOPHAGEAL ENDOSCOPIC ULTRASOUND (EUS) N/A 04/03/2017   Procedure: UPPER ESOPHAGEAL ENDOSCOPIC ULTRASOUND (EUS);  Surgeon: Bearl MulberryBurbridge, Rebecca A, MD;  Location[facility] END[facility]COPY;  Service: Gastroenterology;  Laterality: N/A;    Prior to Admission medications   Medication Sig Start Date End Date Taking? Authorizing Provider  albuterol (PROVENTIL HFA;VENTOLIN HFA) 108 (90 Base) MCG/ACT inhaler Inhale 1 puff into the lungs 2 (two) times daily as needed.    [provider]  aspirin 81 MG tablet Take 81 mg by mouth daily.  [provider]  cetirizine (ZYRTEC) 10 MG chewable tablet Chew 10 mg by mouth daily.    [provider]  cyclobenzaprine (FLEXERIL) 10 MG tablet Take 1 tablet (10 mg total) by mouth 3 (three) times daily. 04/25/16   Zachery Dauer, FNP  dicyclomine  (BENTYL) 10 MG capsule Take 10 mg by mouth 4 (four) times daily -  before meals and at bedtime.    [provider]  fluticasone (FLONASE) 50 MCG/ACT nasal spray Place 1 spray into both nostrils daily.    [provider]  HYDROcodone-acetaminophen (NORCO/VICODIN) 5-325 MG tablet Take 1 tablet by mouth 3 (three) times daily as needed. For chronic pain To last for 30 days from fill date 12/12/17 01/11/18  Barbette Merino, NP  linaclotide [facility]) 290 MCG CAPS capsule Take 290 mcg by mouth daily before breakfast.    [provider]  lisinopril (PRINIVIL,ZESTRIL) 10 MG tablet Take 10 mg by mouth daily.  04/19/15   [provider]  metFORMIN (GLUCOPHAGE) 1000 MG tablet Take 1 tablet by mouth 2 (two) times daily. 06/07/16   [provider]  mirtazapine (REMERON) 15 MG tablet Take 15 mg by mouth at bedtime.    [provider]  pantoprazole (PROTONIX) 40 MG tablet Take 40 mg by mouth daily.    [provider]  polyethylene glycol (MIRALAX / GLYCOLAX) packet Take 17 g by mouth daily.    [provider]  pregabalin (LYRICA) 25 MG capsule Take 1 capsule (25 mg total) by mouth 3 (three) times daily. 12/12/17 01/11/18  Barbette Merino, NP  ranitidine (ZANTAC) 300 MG tablet Take 300 mg by mouth at bedtime.    [provider]  rizatriptan (MAXALT) 10 MG tablet Take 10 mg by mouth as needed for migraine. May repeat in 2 hours if needed    [provider]  rosuvastatin (CRESTOR) 20 MG tablet Take 20 mg by mouth daily.    [provider]  sucralfate (CARAFATE) 1 g tablet Take 1 g by mouth 4 (four) times daily -  with meals and at bedtime.    [provider]  topiramate (TOPAMAX) 25 MG tablet Take 25 mg by mouth 2 (two) times daily.    [provider]  traZODone (DESYREL) 100 MG tablet Take 1 tablet by mouth at bedtime as needed.    [provider]    Allergies  Allergen Reactions  . Tramadol  Itching    Headaches and confusion.  . Benadryl [Diphenhydramine Hcl] Itching  . Bextra [Valdecoxib] Itching  . Invokana [Canagliflozin] Other (See Comments)    Causes yeast infection  . Sulfamethoxazole-Trimethoprim Other (See Comments)    "Makes me feel weird"    Family History  Problem Relation Age of Onset  . Diabetes Mother   . Hypertension Mother     Social History Social History   Tobacco Use  . Smoking status: Current Every Day Smoker    Packs/day: 0.50    Types: Cigarettes  . Smokeless tobacco: Current User  . Tobacco comment: currently decreasing tobacco use  Substance Use Topics  . Alcohol use: No  . Drug use: No    Comment: 4 years ago used Crack    Review of Systems Constitutional: Negative for fever Cardiovascular: Negative for chest pain. Respiratory: Negative for shortness of breath. Gastrointestinal: Left flank pain.  Positive for nausea.  Negative for vomiting, occasional loose stool over the past 1 month. Genitourinary: Slight dysuria.  No vaginal bleeding or discharge.  Musculoskeletal: Negative for musculoskeletal complaints Skin: Negative for skin complaints  Neurological: Negative for headache All other ROS negative  ____________________________________________   PHYSICAL EXAM:  VITAL SIGNS: ED Triage Vitals  Enc Vitals Group     BP 12/07/17 1222 (!) 158/94     Pulse Rate 12/07/17 1222 99     Resp 12/07/17 1222 16     Temp 12/07/17 1222 99 F (37.2 C)     Temp Source 12/07/17 1222 Oral     SpO2 12/07/17 1222 97 %     Weight 12/07/17 1221 124 lb (56.2 kg)     Height 12/07/17 1221 4\' 11"  (1.499 m)     Head Circumference --      Peak Flow --      Pain Score 12/07/17 1225 8     Pain Loc --      Pain Edu? --      Excl. in GC? --    Constitutional: Alert and oriented. Well appearing and in no distress. Eyes: Normal exam ENT   Head: Normocephalic and atraumatic   Mouth/Throat: Mucous membranes are moist. Cardiovascular:  Normal rate, regular rhythm. No murmur Respiratory: Normal respiratory effort without tachypnea nor retractions. Breath sounds are clear Gastrointestinal: Soft, mild left-sided tenderness to palpation.  No rebound guarding or distention, mild left CVA tenderness. Musculoskeletal: Nontender with normal range of motion in all extremities.  Neurologic:  Normal speech and language. No gross focal neurologic deficits  Skin:  Skin is warm, dry and intact.  Psychiatric: Mood and affect are normal.   ____________________________________________    RADIOLOGY  CT negative for acute abnormality.  ____________________________________________   INITIAL IMPRESSION / ASSESSMENT AND PLAN / ED COURSE  Pertinent labs & imaging results that were available during my care of the patient were reviewed by me and considered in my medical decision making (see chart for details).  Patient presents to the emergency department for left-sided abdominal pain for the past 1 month.  Differential is quite broad but would include colitis, diverticulitis, ovarian pathology, urinary tract infection, pyelonephritis, functional abdominal pain, fibromyalgia.  Patient's blood work is largely within normal limits, urinalysis pending.  We will also obtain a CT renal scan to further evaluate.  Patient agreeable to plan of care.  CT scan negative for acute abnormality.  Patient does have cholelithiasis with possible hepatic steatosis.  Patient does not have any right-sided abdominal pain do not believe this is contributing to her symptoms.  I did discuss with patient need to follow-up with her gastroenterologist.  Patient is on chronic pain medication by pain management.  Patient will follow-up with her gastroneurologist. ____________________________________________   FINAL CLINICAL IMPRESSION(S) / ED DIAGNOSES  Left flank pain    Minna Antis, MD 12/07/17 1504    Minna Antis, MD 12/07/17 [phone]

## 2017-12-07 NOTE — ED Notes (Signed)
This RN attempted to collet a signature and provided discharged instructions and pt stated "I already know everything" pt left the room w/out electronically signing discharge documents.

## 2017-12-08 ENCOUNTER — Telehealth: Payer: Self-pay | Admitting: Nurse Practitioner

## 2017-12-08 NOTE — Telephone Encounter (Signed)
Patient states pharmacy needs prior auth thru medicaid for scripts to be filled. Please let her know when she can pick up meds

## 2017-12-25 ENCOUNTER — Ambulatory Visit: Admission: RE | Admit: 2017-12-25 | Payer: Medicaid Other | Source: Ambulatory Visit | Admitting: Gastroenterology

## 2017-12-25 ENCOUNTER — Encounter: Admission: RE | Payer: Self-pay | Source: Ambulatory Visit

## 2017-12-25 SURGERY — ULTRASOUND, UPPER GI TRACT, ENDOSCOPIC
Anesthesia: General

## 2017-12-26 ENCOUNTER — Telehealth: Payer: Self-pay

## 2017-12-26 NOTE — Telephone Encounter (Signed)
Received call back from Ms. [place]. She has been rescheduled for EUS 10/31. Reviewed instructions and new copy mailed to home address. Oncology Nurse Navigator Documentation  Navigator Location: [facility] (12/26/17 1000)   )Navigator Encounter Type: Telephone (12/26/17 1000) Telephone: Incoming Call (12/26/17 1000)                               Coordination of Care: EUS (12/26/17 1000)                  Time Spent with Patient: 15 (12/26/17 1000)

## 2017-12-26 NOTE — Telephone Encounter (Signed)
[name] cancelled EUS due to illness. Voicemail left with her to call for rescheduling. Oncology Nurse Navigator Documentation  Navigator Location: [facility] (12/26/17 0900)   )Navigator Encounter Type: Telephone (12/26/17 0900) Telephone: [name] Call (12/26/17 0900)                       Barriers/Navigation Needs: Coordination of Care (12/26/17 0900)   Interventions: Coordination of Care (12/26/17 0900)   Coordination of Care: EUS (12/26/17 0900)                  Time Spent with Patient: 15 (12/26/17 0900)

## 2017-12-29 ENCOUNTER — Telehealth: Payer: Self-pay | Admitting: *Deleted

## 2017-12-29 ENCOUNTER — Ambulatory Visit: Payer: Medicaid Other | Attending: Nurse Practitioner | Admitting: Nurse Practitioner

## 2017-12-29 ENCOUNTER — Encounter: Payer: Self-pay | Admitting: Nurse Practitioner

## 2017-12-29 ENCOUNTER — Other Ambulatory Visit: Payer: Self-pay

## 2017-12-29 VITALS — BP 141/95 | HR 97 | Temp 97.8°F | Ht 59.0 in | Wt 124.0 lb

## 2017-12-29 DIAGNOSIS — I10 Essential (primary) hypertension: Secondary | ICD-10-CM | POA: Insufficient documentation

## 2017-12-29 DIAGNOSIS — G894 Chronic pain syndrome: Secondary | ICD-10-CM | POA: Insufficient documentation

## 2017-12-29 DIAGNOSIS — M797 Fibromyalgia: Secondary | ICD-10-CM | POA: Insufficient documentation

## 2017-12-29 DIAGNOSIS — Z9049 Acquired absence of other specified parts of digestive tract: Secondary | ICD-10-CM | POA: Insufficient documentation

## 2017-12-29 DIAGNOSIS — Z833 Family history of diabetes mellitus: Secondary | ICD-10-CM | POA: Insufficient documentation

## 2017-12-29 DIAGNOSIS — Z8249 Family history of ischemic heart disease and other diseases of the circulatory system: Secondary | ICD-10-CM | POA: Insufficient documentation

## 2017-12-29 DIAGNOSIS — R011 Cardiac murmur, unspecified: Secondary | ICD-10-CM | POA: Diagnosis not present

## 2017-12-29 DIAGNOSIS — Z7982 Long term (current) use of aspirin: Secondary | ICD-10-CM | POA: Insufficient documentation

## 2017-12-29 DIAGNOSIS — J449 Chronic obstructive pulmonary disease, unspecified: Secondary | ICD-10-CM | POA: Diagnosis not present

## 2017-12-29 DIAGNOSIS — K219 Gastro-esophageal reflux disease without esophagitis: Secondary | ICD-10-CM | POA: Insufficient documentation

## 2017-12-29 DIAGNOSIS — I700 Atherosclerosis of aorta: Secondary | ICD-10-CM | POA: Diagnosis not present

## 2017-12-29 DIAGNOSIS — Z79891 Long term (current) use of opiate analgesic: Secondary | ICD-10-CM | POA: Diagnosis not present

## 2017-12-29 DIAGNOSIS — M160 Bilateral primary osteoarthritis of hip: Secondary | ICD-10-CM | POA: Diagnosis not present

## 2017-12-29 DIAGNOSIS — Z882 Allergy status to sulfonamides status: Secondary | ICD-10-CM | POA: Insufficient documentation

## 2017-12-29 DIAGNOSIS — F17210 Nicotine dependence, cigarettes, uncomplicated: Secondary | ICD-10-CM | POA: Diagnosis not present

## 2017-12-29 DIAGNOSIS — H8110 Benign paroxysmal vertigo, unspecified ear: Secondary | ICD-10-CM | POA: Insufficient documentation

## 2017-12-29 DIAGNOSIS — E119 Type 2 diabetes mellitus without complications: Secondary | ICD-10-CM | POA: Insufficient documentation

## 2017-12-29 DIAGNOSIS — M5416 Radiculopathy, lumbar region: Secondary | ICD-10-CM

## 2017-12-29 DIAGNOSIS — M5136 Other intervertebral disc degeneration, lumbar region: Secondary | ICD-10-CM

## 2017-12-29 DIAGNOSIS — Z79899 Other long term (current) drug therapy: Secondary | ICD-10-CM | POA: Diagnosis not present

## 2017-12-29 DIAGNOSIS — M5116 Intervertebral disc disorders with radiculopathy, lumbar region: Secondary | ICD-10-CM | POA: Insufficient documentation

## 2017-12-29 DIAGNOSIS — F329 Major depressive disorder, single episode, unspecified: Secondary | ICD-10-CM | POA: Insufficient documentation

## 2017-12-29 DIAGNOSIS — G8929 Other chronic pain: Secondary | ICD-10-CM

## 2017-12-29 DIAGNOSIS — E782 Mixed hyperlipidemia: Secondary | ICD-10-CM | POA: Diagnosis not present

## 2017-12-29 DIAGNOSIS — M7918 Myalgia, other site: Secondary | ICD-10-CM | POA: Diagnosis not present

## 2017-12-29 DIAGNOSIS — Z888 Allergy status to other drugs, medicaments and biological substances status: Secondary | ICD-10-CM | POA: Diagnosis not present

## 2017-12-29 DIAGNOSIS — Z7984 Long term (current) use of oral hypoglycemic drugs: Secondary | ICD-10-CM | POA: Insufficient documentation

## 2017-12-29 MED ORDER — PREGABALIN 25 MG PO CAPS: 25.0000 mg | ORAL_CAPSULE | Freq: Three times a day (TID) | ORAL | 0 refills | Status: DC

## 2017-12-29 MED ORDER — HYDROCODONE-ACETAMINOPHEN 5-325 MG PO TABS: 1.0000 | ORAL_TABLET | Freq: Three times a day (TID) | ORAL | 0 refills | Status: DC | PRN

## 2017-12-29 NOTE — H&P (View-Only) (Signed)
Patient's Name: [name]  MRN: [id number]  Referring Provider: Sharyne Peach, MD  DOB: [birth year]  PCP: Sharyne Peach, MD  DOS: 12/29/2017  Note by: Vevelyn Francois NP  Service setting: Ambulatory outpatient  Specialty: Interventional Pain Management  Location: [facility] (AMB) Pain Management Facility    Patient type: Established    Primary Reason(s) for Visit: Encounter for prescription drug management. (Level of risk: moderate)  CC: Back Pain (and stomach)  HPI  [name] is a 57 y.o. year old, female patient, who comes today for a medication management evaluation. She has Essential hypertension; Diabetes ([place]); Fibromyalgia; COPD (chronic obstructive pulmonary disease) ([place]); Diverticulitis; Lumbar radiculopathy; Chronic bilateral low back pain with bilateral sciatica; Chronic pain syndrome; Chronic, continuous use of opioids; Chronic myofascial pain; Benign paroxysmal positional vertigo; Cardiac murmur; DDD (degenerative disc disease), lumbar; Depression; Fatty liver disease, nonalcoholic; Primary osteoarthritis of both hips; Hyperlipidemia, mixed; Smoker; Type 2 diabetes mellitus without complications ([place]); Aortic atherosclerosis ([place]); Pharmacologic therapy; Disorder of skeletal system; and Problems influencing health status on their problem list. Her primarily concern today is the Back Pain (and stomach)  Pain Assessment: Location: Lower Back(stomach pain and hands) Radiating: Pain radiaties down leg and hips Onset: More than a month ago Duration: Chronic pain Quality: Aching, Burning, Constant, Nagging Severity: 8 /10 (subjective, self-reported pain score)  Note: Reported level is compatible with observation. Clinically the patient looks like a 2/10 A 2/10 is viewed as "Mild to Moderate" and described as noticeable and distracting. Impossible to hide from other people. More frequent flare-ups. Still possible to adapt and function close to normal. It can be very annoying  and may have occasional stronger flare-ups. With discipline, patients may get used to it and adapt. [name] does not seem to understand the use of our objective pain scale When using our objective Pain Scale, levels between 6 and 10/10 are said to belong in an emergency room, as it progressively worsens from a 6/10, described as severely limiting, requiring emergency care not usually available at an outpatient pain management facility. At a 6/10 level, communication becomes difficult and requires great effort. Assistance to reach the emergency department may be required. Facial flushing and profuse sweating along with potentially dangerous increases in heart rate and blood pressure will be evident. Effect on ADL: limits my daily activites Timing: Constant Modifying factors: medications and rest BP: (!) 141/95  HR: 97  [name] was last scheduled for an appointment on 12/08/2017 for medication management. During today's appointment we reviewed [name]'s chronic pain status, as well as her outpatient medication regimen. She admits that she has not gotten her Lyrica for this month.  She admits that Lyrica is very effective for her pain.  She states that this was related to a prior authorization needed for insurance.  The patient  reports that she does not use drugs. Her body mass index is 25.04 kg/m.  Further details on both, my assessment(s), as well as the proposed treatment plan, please see below.  Controlled Substance Pharmacotherapy Assessment REMS (Risk Evaluation and Mitigation Strategy)  Analgesic:Hydrocodone 5 mg TID prn MME/day:23m/day. BChauncey Fischer RN  12/29/2017  9:27 AM  Sign at close encounter Nursing Pain Medication Assessment:  Safety precautions to be maintained throughout the outpatient stay will include: orient to surroundings, keep bed in low position, maintain call bell within reach at all times, provide assistance with transfer out of bed and  ambulation.  Medication Inspection Compliance: Pill count conducted under  aseptic conditions, in front of the patient. Neither the pills nor the bottle was removed from the patient's sight at any time. Once count was completed pills were immediately returned to the patient in their original bottle.  Medication: Hydrocodone/APAP Pill/Patch Count: 47 of 90 pills remain Pill/Patch Appearance: Markings consistent with prescribed medication Bottle Appearance: Standard pharmacy container. Clearly labeled. Filled Date: 14 / 21 / 2019 Last Medication intake:  Today   Pharmacokinetics: Liberation and absorption (onset of action): WNL Distribution (time to peak effect): WNL Metabolism and excretion (duration of action): WNL         Pharmacodynamics: Desired effects: Analgesia: [name] reports >50% benefit. Functional ability: Patient reports that medication allows her to accomplish basic ADLs Clinically meaningful improvement in function (CMIF): Sustained CMIF goals met Perceived effectiveness: Described as relatively effective, allowing for increase in activities of daily living (ADL) Undesirable effects: Side-effects or Adverse reactions: None reported Monitoring: [place] PMP: Online review of the past 12-monthperiod conducted. Compliant with practice rules and regulations Last UDS on record: Summary  Date Value Ref Range Status  11/27/2017 FINAL  Final    Comment:    ==================================================================== TOXASSURE SELECT 13 (MW) ==================================================================== Test                             Result       Flag       Units Drug Present and Declared for Prescription Verification   Hydrocodone                    291          EXPECTED   ng/mg creat   Hydromorphone                  146          EXPECTED   ng/mg creat   Dihydrocodeine                 141          EXPECTED   ng/mg creat   Norhydrocodone                 833           EXPECTED   ng/mg creat    Sources of hydrocodone include scheduled prescription    medications. Hydromorphone, dihydrocodeine and norhydrocodone are    expected metabolites of hydrocodone. Hydromorphone and    dihydrocodeine are also available as scheduled prescription    medications. ==================================================================== Test                      Result    Flag   Units      Ref Range   Creatinine              78               mg/dL      >=20 ==================================================================== Declared Medications:  The flagging and interpretation on this report are based on the  following declared medications.  Unexpected results may arise from  inaccuracies in the declared medications.  **Note: The testing scope of this panel includes these medications:  Hydrocodone (Hydrocodone-Acetaminophen)  **Note: The testing scope of this panel does not include following  reported medications:  Acetaminophen (Hydrocodone-Acetaminophen)  Albuterol  Aspirin (Aspirin 81)  Cetirizine  Cyclobenzaprine  Dicyclomine  Fluticasone  Linaclotide  Lisinopril  Metformin  Mirtazapine  Pantoprazole  Polyethylene Glycol  Pregabalin  Ranitidine  Rizatriptan (Maxalt)  Rosuvastatin (Crestor)  Sucralfate (Carafate)  Topiramate (Topamax)  Trazodone (Desyrel) ==================================================================== For clinical consultation, please call [phone]. ====================================================================    UDS interpretation: Compliant          Medication Assessment Form: Reviewed. Patient indicates being compliant with therapy Treatment compliance: Compliant Risk Assessment Profile: Aberrant behavior: See prior evaluations. None observed or detected today Comorbid factors increasing risk of overdose: See prior notes. No additional risks detected today Opioid risk tool (ORT) (Total Score):  11 Personal History of Substance Abuse (SUD-Substance use disorder):  Alcohol: Negative  Illegal Drugs: Positive Female or Female  Rx Drugs: Negative  ORT Risk Level calculation: High Risk Risk of substance use disorder (SUD): High Opioid Risk Tool - 12/29/17 0922      Family History of Substance Abuse   Alcohol  Positive Female    Illegal Drugs  Positive Female    Rx Drugs  Positive Female or Female      Personal History of Substance Abuse   Alcohol  Negative    Illegal Drugs  Positive Female or Female    Rx Drugs  Negative      Age   Age between 49-45 years   No      History of Preadolescent Sexual Abuse   History of Preadolescent Sexual Abuse  Negative or Female      Psychological Disease   Psychological Disease  Negative    Depression  Negative      Total Score   Opioid Risk Tool Scoring  11    Opioid Risk Interpretation  High Risk      ORT Scoring interpretation table:  Score <3 = Low Risk for SUD  Score between 4-7 = Moderate Risk for SUD  Score >8 = High Risk for Opioid Abuse   Risk Mitigation Strategies:  Patient Counseling: Covered Patient-Prescriber Agreement (PPA): Present and active  Notification to other healthcare providers: Done  Pharmacologic Plan: No change in therapy, at this time.             Laboratory Chemistry  Inflammation Markers (CRP: Acute Phase) (ESR: Chronic Phase) Lab Results  Component Value Date   CRP <1 11/27/2017   ESRSEDRATE 17 11/27/2017   LATICACIDVEN 1.6 10/14/2016                         Rheumatology Markers No results found for: RF, ANA, LABURIC, URICUR, LYMEIGGIGMAB, LYMEABIGMQN, HLAB27                      Renal Function Markers Lab Results  Component Value Date   BUN 10 12/07/2017   CREATININE 0.74 12/07/2017   BCR 14 11/27/2017   GFRAA >60 12/07/2017   GFRNONAA >60 12/07/2017                             Hepatic Function Markers Lab Results  Component Value Date   AST 22 12/07/2017   ALT 28 12/07/2017    ALBUMIN 4.0 12/07/2017   ALKPHOS 74 12/07/2017   LIPASE 35 12/07/2017                        Electrolytes Lab Results  Component Value Date   NA 143 12/07/2017   K 3.7 12/07/2017   CL 112 (H) 12/07/2017   CALCIUM 9.4 12/07/2017   MG  1.8 11/27/2017                        Neuropathy Markers Lab Results  Component Value Date   HGBA1C 10.4 (H) 05/02/2016   HIV Non Reactive 10/15/2016                        CNS Tests No results found for: COLORCSF, APPEARCSF, RBCCOUNTCSF, WBCCSF, POLYSCSF, LYMPHSCSF, EOSCSF, PROTEINCSF, GLUCCSF, JCVIRUS, CSFOLI, IGGCSF                      Bone Pathology Markers No results found for: VD25OH, [id number], OV7858IF0, [id number], 25OHVITD1, 25OHVITD2, 25OHVITD3, TESTOFREE, TESTOSTERONE                       Coagulation Parameters Lab Results  Component Value Date   PLT 255 12/07/2017                        Cardiovascular Markers Lab Results  Component Value Date   TROPONINI <0.03 10/16/2016   HGB 12.7 12/07/2017   HCT 38.3 12/07/2017                         CA Markers No results found for: CEA, CA125, LABCA2                      Note: Lab results reviewed.  Recent Diagnostic Imaging Results  CT Renal Stone Study CLINICAL DATA:  Abdominal pain left-sided 1 month.  EXAM: CT ABDOMEN AND PELVIS WITHOUT CONTRAST  TECHNIQUE: Multidetector CT imaging of the abdomen and pelvis was performed following the standard protocol without IV contrast.  COMPARISON:  06/26/2017 and 10/14/2016  FINDINGS: Lower chest: Lung bases are clear.  Hepatobiliary: Diffuse low-attenuation of the liver without focal mass. Biliary tree is within normal. Mild cholelithiasis.  Pancreas: Normal.  Spleen: Normal.  Adrenals/Urinary Tract: Adrenal glands are normal. Kidneys are normal in size without hydronephrosis or nephrolithiasis. Ureters and bladder are normal.  Stomach/Bowel: Stomach and small bowel are normal. Appendix is normal. Colon is  unremarkable.  Vascular/Lymphatic: Mild calcified plaque over the abdominal aorta. No adenopathy.  Reproductive: Previous hysterectomy.  Adnexal regions unremarkable.  Other: No free fluid or focal inflammatory change.  Musculoskeletal: Unremarkable.  IMPRESSION: No acute findings in the abdomen/pelvis.  Cholelithiasis.  Diffuse low-attenuation of the liver likely steatosis.  Aortic Atherosclerosis (ICD10-I70.0).  Electronically Signed   By: Marin Olp M.D.   On: 12/07/2017 14:48  Complexity Note: Imaging results reviewed. Results shared with [name], using Layman's terms.                         Meds   Current Outpatient Medications:  .  albuterol (PROVENTIL HFA;VENTOLIN HFA) 108 (90 Base) MCG/ACT inhaler, Inhale 1 puff into the lungs 2 (two) times daily as needed., Disp: , Rfl:  .  aspirin 81 MG tablet, Take 81 mg by mouth daily. , Disp: , Rfl:  .  cetirizine (ZYRTEC) 10 MG chewable tablet, Chew 10 mg by mouth daily., Disp: , Rfl:  .  cyclobenzaprine (FLEXERIL) 10 MG tablet, Take 1 tablet (10 mg total) by mouth 3 (three) times daily., Disp: 30 tablet, Rfl: 0 .  fluticasone (FLONASE) 50 MCG/ACT nasal spray, Place 1 spray into both nostrils daily., Disp: , Rfl:  .  [  START ON 01/11/2018] HYDROcodone-acetaminophen (NORCO/VICODIN) 5-325 MG tablet, Take 1 tablet by mouth 3 (three) times daily as needed., Disp: 90 tablet, Rfl: 0 .  hyoscyamine (LEVBID) 0.375 MG 12 hr tablet, Take 0.375 mg by mouth 3 (three) times daily., Disp: , Rfl:  .  linaclotide (LINZESS) 290 MCG CAPS capsule, Take 290 mcg by mouth daily before breakfast., Disp: , Rfl:  .  lisinopril (PRINIVIL,ZESTRIL) 10 MG tablet, Take 10 mg by mouth daily. , Disp: , Rfl:  .  metFORMIN (GLUCOPHAGE) 1000 MG tablet, Take 1 tablet by mouth 2 (two) times daily., Disp: , Rfl:  .  mirtazapine (REMERON) 15 MG tablet, Take 15 mg by mouth at bedtime., Disp: , Rfl:  .  pantoprazole (PROTONIX) 40 MG tablet, Take 40 mg by  mouth daily., Disp: , Rfl:  .  polyethylene glycol (MIRALAX / GLYCOLAX) packet, Take 17 g by mouth daily., Disp: , Rfl:  .  pregabalin (LYRICA) 25 MG capsule, Take 1 capsule (25 mg total) by mouth 3 (three) times daily., Disp: 90 capsule, Rfl: 0 .  ranitidine (ZANTAC) 300 MG tablet, Take 300 mg by mouth at bedtime., Disp: , Rfl:  .  rizatriptan (MAXALT) 10 MG tablet, Take 10 mg by mouth as needed for migraine. May repeat in 2 hours if needed, Disp: , Rfl:  .  rosuvastatin (CRESTOR) 20 MG tablet, Take 20 mg by mouth daily., Disp: , Rfl:  .  sucralfate (CARAFATE) 1 g tablet, Take 1 g by mouth 4 (four) times daily -  with meals and at bedtime., Disp: , Rfl:  .  topiramate (TOPAMAX) 25 MG tablet, Take 25 mg by mouth 2 (two) times daily., Disp: , Rfl:  .  traZODone (DESYREL) 100 MG tablet, Take 1 tablet by mouth at bedtime as needed., Disp: , Rfl:   ROS  Constitutional: Denies any fever or chills Gastrointestinal: No reported hemesis, hematochezia, vomiting, or acute GI distress Musculoskeletal: Denies any acute onset joint swelling, redness, loss of ROM, or weakness Neurological: No reported episodes of acute onset apraxia, aphasia, dysarthria, agnosia, amnesia, paralysis, loss of coordination, or loss of consciousness  Allergies  [name] is allergic to tramadol; benadryl [diphenhydramine hcl]; bextra [valdecoxib]; invokana [canagliflozin]; and sulfamethoxazole-trimethoprim.  PFSH  Drug: [name]  reports that she does not use drugs. Alcohol:  reports that she does not drink alcohol. Tobacco:  reports that she has been smoking cigarettes. She has been smoking about 0.50 packs per day. She uses smokeless tobacco. Medical:  has a past medical history of Asthma, Asthma, BPPV (benign paroxysmal positional vertigo), Cardiac murmur, unspecified (08/28/2016), Degenerative disc disease, lumbar, Depression, Diabetes mellitus, Fatty liver, Fibromyalgia, Fibromyalgia, GERD (gastroesophageal  reflux disease), History of degenerative disc disease, Hyperlipidemia, and Hypertension. Surgical: [name]  has a past surgical history that includes Abdominal surgery; Abdominal hysterectomy; Esophagogastroduodenoscopy (egd) with propofol (N/A, 02/20/2017); Colonoscopy with propofol (N/A, 02/20/2017); Upper esophageal endoscopic ultrasound (eus) (N/A, 04/03/2017); and Diverticulitis. Family: family history includes Diabetes in her mother; Hypertension in her mother.  Constitutional Exam  General appearance: Well nourished, well developed, and well hydrated. In no apparent acute distress Vitals:   12/29/17 0912  BP: (!) 141/95  Pulse: 97  Temp: 97.8 F (36.6 C)  SpO2: 98%  Weight: 124 lb (56.2 kg)  Height: '4\' 11"'  (1.499 m)   BMI Assessment: Estimated body mass index is 25.04 kg/m as calculated from the following:   Height as of this encounter: '4\' 11"'  (1.499 m).   Weight as of this  encounter: 124 lb (56.2 kg). Psych/Mental status: Alert, oriented x 3 (person, place, & time)       Eyes: PERLA Respiratory: No evidence of acute respiratory distress   Lumbar Spine Area Exam  Skin & Axial Inspection: No masses, redness, or swelling Alignment: Symmetrical Functional ROM: Unrestricted ROM       Stability: No instability detected Muscle Tone/Strength: Functionally intact. No obvious neuro-muscular anomalies detected. Sensory (Neurological): Unimpaired Palpation: Tender        Gait & Posture Assessment  Ambulation: Unassisted Gait: Relatively normal for age and body habitus Posture: WNL   Lower Extremity Exam    Side: Right lower extremity  Side: Left lower extremity  Stability: No instability observed          Stability: No instability observed          Skin & Extremity Inspection: Skin color, temperature, and hair growth are WNL. No peripheral edema or cyanosis. No masses, redness, swelling, asymmetry, or associated skin lesions. No contractures.  Skin & Extremity Inspection:  Skin color, temperature, and hair growth are WNL. No peripheral edema or cyanosis. No masses, redness, swelling, asymmetry, or associated skin lesions. No contractures.  Functional ROM: Unrestricted ROM                  Functional ROM: Unrestricted ROM                  Muscle Tone/Strength: Functionally intact. No obvious neuro-muscular anomalies detected.  Muscle Tone/Strength: Functionally intact. No obvious neuro-muscular anomalies detected.  Sensory (Neurological): Unimpaired  Sensory (Neurological): Unimpaired  Palpation: No palpable anomalies  Palpation: No palpable anomalies   Assessment  Primary Diagnosis & Pertinent Problem List: The primary encounter diagnosis was Lumbar radiculopathy. Diagnoses of DDD (degenerative disc disease), lumbar, Chronic myofascial pain, and Chronic pain syndrome were also pertinent to this visit.  Status Diagnosis  Persistent Persistent Worsening 1. Lumbar radiculopathy   2. DDD (degenerative disc disease), lumbar   3. Chronic myofascial pain   4. Chronic pain syndrome     Problems updated and reviewed during this visit: Problem  Cardiac Murmur   Plan of Care  Pharmacotherapy (Medications Ordered): Meds ordered this encounter  Medications  . HYDROcodone-acetaminophen (NORCO/VICODIN) 5-325 MG tablet    Sig: Take 1 tablet by mouth 3 (three) times daily as needed.    Dispense:  90 tablet    Refill:  0    Do not add this medication to the electronic "Automatic Refill" notification system. Patient may have prescription filled one day early if pharmacy is closed on scheduled refill date.    Order Specific Question:   Supervising Provider    Answer:   Milinda Pointer [phone]  . pregabalin (LYRICA) 25 MG capsule    Sig: Take 1 capsule (25 mg total) by mouth 3 (three) times daily.    Dispense:  90 capsule    Refill:  0    Order Specific Question:   Supervising Provider    AnswerMilinda Pointer [phone]   New Prescriptions   No  medications on file   Medications administered today: Trenisha Lafavor. Samad had no medications administered during this visit. Lab-work, procedure(s), and/or referral(s): No orders of the defined types were placed in this encounter.  Imaging and/or referral(s): None  Interventional therapies: Planned, scheduled, and/or pending:   Not at this time.   Provider-requested follow-up: Return in about 5 weeks (around 02/02/2018) for MedMgmt.  Future Appointments  Date Time Provider [place]  01/28/2018  8:30 AM Vevelyn Francois, NP [facility] None   Primary Care Physician: Sharyne Peach, MD Location: [facility] [place] [facility] Note by: Vevelyn Francois NP Date: 12/29/2017; Time: 4:12 PM  Pain Score Disclaimer: We use the NRS-11 scale. This is a self-reported, subjective measurement of pain severity with only modest accuracy. It is used primarily to identify changes within a particular patient. It must be understood that outpatient pain scales are significantly less accurate that those used for research, where they can be applied under ideal controlled circumstances with minimal exposure to variables. In reality, the score is likely to be a combination of pain intensity and pain affect, where pain affect describes the degree of emotional arousal or changes in action readiness caused by the sensory experience of pain. Factors such as social and work situation, setting, emotional state, anxiety levels, expectation, and prior pain experience may influence pain perception and show large inter-individual differences that may also be affected by time variables.  Patient instructions provided during this appointment: Patient Instructions  ____________________________________________________________________________________________  Medication Rules  Applies to: All patients receiving prescriptions (written or electronic).  Pharmacy of record: Pharmacy where electronic  prescriptions will be sent. If written prescriptions are taken to a different pharmacy, please inform the nursing staff. The pharmacy listed in the electronic medical record should be the one where you would like electronic prescriptions to be sent.  Prescription refills: Only during scheduled appointments. Applies to both, written and electronic prescriptions.  NOTE: The following applies primarily to controlled substances (Opioid* Pain Medications).   Patient's responsibilities: 1. Pain Pills: Bring all pain pills to every appointment (except for procedure appointments). 2. Pill Bottles: Bring pills in original pharmacy bottle. Always bring newest bottle. Bring bottle, even if empty. 3. Medication refills: You are responsible for knowing and keeping track of what medications you need refilled. The day before your appointment, write a list of all prescriptions that need to be refilled. Bring that list to your appointment and give it to the admitting nurse. Prescriptions will be written only during appointments. If you forget a medication, it will not be "Called in", "Faxed", or "electronically sent". You will need to get another appointment to get these prescribed. 4. Prescription Accuracy: You are responsible for carefully inspecting your prescriptions before leaving our office. Have the discharge nurse carefully go over each prescription with you, before taking them home. Make sure that your name is accurately spelled, that your address is correct. Check the name and dose of your medication to make sure it is accurate. Check the number of pills, and the written instructions to make sure they are clear and accurate. Make sure that you are given enough medication to last until your next medication refill appointment. 5. Taking Medication: Take medication as prescribed. Never take more pills than instructed. Never take medication more frequently than prescribed. Taking less pills or less frequently is  permitted and encouraged, when it comes to controlled substances (written prescriptions).  6. Inform other Doctors: Always inform, all of your healthcare providers, of all the medications you take. 7. Pain Medication from other Providers: You are not allowed to accept any additional pain medication from any other Doctor or Healthcare provider. There are two exceptions to this rule. (see below) In the event that you require additional pain medication, you are responsible for notifying us, as stated below. 8. Medication Agreement: You are responsible for carefully reading and following our Medication Agreement. This must be signed before receiving  any prescriptions from our practice. Safely store a copy of your signed Agreement. Violations to the Agreement will result in no further prescriptions. (Additional copies of our Medication Agreement are available upon request.) 9. Laws, Rules, & Regulations: All patients are expected to follow all Federal and [employer], [employer], Rules, [employer]. Ignorance of the Laws does not constitute a valid excuse. The use of any illegal substances is prohibited. 10. Adopted CDC guidelines & recommendations: Target dosing levels will be at or below 60 MME/day. Use of benzodiazepines** is not recommended.  Exceptions: There are only two exceptions to the rule of not receiving pain medications from other Healthcare Providers. 1. Exception #1 (Emergencies): In the event of an emergency (i.e.: accident requiring emergency care), you are allowed to receive additional pain medication. However, you are responsible for: As soon as you are able, call our office [phone], at any time of the day or night, and leave a message stating your name, the date and nature of the emergency, and the name and dose of the medication prescribed. In the event that your call is answered by a member of our staff, make sure to document and save the date, time, and the name of the person that  took your information.  2. Exception #2 (Planned Surgery): In the event that you are scheduled by another doctor or dentist to have any type of surgery or procedure, you are allowed (for a period no longer than 30 days), to receive additional pain medication, for the acute post-op pain. However, in this case, you are responsible for picking up a copy of our "Post-op Pain Management for Surgeons" handout, and giving it to your surgeon or dentist. This document is available at our office, and does not require an appointment to obtain it. Simply go to our office during business hours (Monday-Thursday from 8:00 AM to 4:00 PM) (Friday 8:00 AM to 12:00 Noon) or if you have a scheduled appointment with [name], prior to your surgery, and ask for it by name. In addition, you will need to provide [name] with your name, name of your surgeon, type of surgery, and date of procedure or surgery.  *Opioid medications include: morphine, codeine, oxycodone, oxymorphone, hydrocodone, hydromorphone, meperidine, tramadol, tapentadol, buprenorphine, fentanyl, methadone. **Benzodiazepine medications include: diazepam (Valium), alprazolam (Xanax), clonazepam (Klonopine), lorazepam (Ativan), clorazepate (Tranxene), chlordiazepoxide (Librium), estazolam (Prosom), oxazepam (Serax), temazepam (Restoril), triazolam (Halcion) (Last updated: 05/22/2017) ____________________________________________________________________________________________

## 2017-12-29 NOTE — Telephone Encounter (Signed)
Someone submitted a PA for Lyrica on 12-24-17, she must try and fail Gabapentin and Cymbalta.

## 2017-12-29 NOTE — Progress Notes (Signed)
Nursing Pain Medication Assessment:  Safety precautions to be maintained throughout the outpatient stay will include: orient to surroundings, keep bed in low position, maintain call bell within reach at all times, provide assistance with transfer out of bed and ambulation.  Medication Inspection Compliance: Pill count conducted under aseptic conditions, in front of the patient. Neither the pills nor the bottle was removed from the patient's sight at any time. Once count was completed pills were immediately returned to the patient in their original bottle.  Medication: Hydrocodone/APAP Pill/Patch Count: 47 of 90 pills remain Pill/Patch Appearance: Markings consistent with prescribed medication Bottle Appearance: Standard pharmacy container. Clearly labeled. Filled Date: 44 / 21 / 2019 Last Medication intake:  Today

## 2017-12-29 NOTE — Progress Notes (Signed)
Patient's Name: [name]  MRN: [id number]  Referring Provider: Sharyne Peach, MD  DOB: 1960/04/13  PCP: Sharyne Peach, MD  DOS: 12/29/2017  Note by: Vevelyn Francois NP  Service setting: Ambulatory outpatient  Specialty: Interventional Pain Management  Location: [facility] (AMB) Pain Management Facility    Patient type: Established    Primary Reason(s) for Visit: Encounter for prescription drug management. (Level of risk: moderate)  CC: Back Pain (and stomach)  HPI  [name] is a 57 y.o. year old, female patient, who comes today for a medication management evaluation. She has Essential hypertension; Diabetes ([place]); Fibromyalgia; COPD (chronic obstructive pulmonary disease) ([place]); Diverticulitis; Lumbar radiculopathy; Chronic bilateral low back pain with bilateral sciatica; Chronic pain syndrome; Chronic, continuous use of opioids; Chronic myofascial pain; Benign paroxysmal positional vertigo; Cardiac murmur; DDD (degenerative disc disease), lumbar; Depression; Fatty liver disease, nonalcoholic; Primary osteoarthritis of both hips; Hyperlipidemia, mixed; Smoker; Type 2 diabetes mellitus without complications ([place]); Aortic atherosclerosis ([place]); Pharmacologic therapy; Disorder of skeletal system; and Problems influencing health status on their problem list. Her primarily concern today is the Back Pain (and stomach)  Pain Assessment: Location: Lower Back(stomach pain and hands) Radiating: Pain radiaties down leg and hips Onset: More than a month ago Duration: Chronic pain Quality: Aching, Burning, Constant, Nagging Severity: 8 /10 (subjective, self-reported pain score)  Note: Reported level is compatible with observation. Clinically the patient looks like a 2/10 A 2/10 is viewed as "Mild to Moderate" and described as noticeable and distracting. Impossible to hide from other people. More frequent flare-ups. Still possible to adapt and function close to normal. It can be very annoying  and may have occasional stronger flare-ups. With discipline, patients may get used to it and adapt. [name] does not seem to understand the use of our objective pain scale When using our objective Pain Scale, levels between 6 and 10/10 are said to belong in an emergency room, as it progressively worsens from a 6/10, described as severely limiting, requiring emergency care not usually available at an outpatient pain management facility. At a 6/10 level, communication becomes difficult and requires great effort. Assistance to reach the emergency department may be required. Facial flushing and profuse sweating along with potentially dangerous increases in heart rate and blood pressure will be evident. Effect on ADL: limits my daily activites Timing: Constant Modifying factors: medications and rest BP: (!) 141/95  HR: 97  [name] was last scheduled for an appointment on 12/08/2017 for medication management. During today's appointment we reviewed [name]'s chronic pain status, as well as her outpatient medication regimen. She admits that she has not gotten her Lyrica for this month.  She admits that Lyrica is very effective for her pain.  She states that this was related to a prior authorization needed for insurance.  The patient  reports that she does not use drugs. Her body mass index is 25.04 kg/m.  Further details on both, my assessment(s), as well as the proposed treatment plan, please see below.  Controlled Substance Pharmacotherapy Assessment REMS (Risk Evaluation and Mitigation Strategy)  Analgesic:Hydrocodone 5 mg TID prn MME/day:44m/day. BChauncey Fischer RN  12/29/2017  9:27 AM  Sign at close encounter Nursing Pain Medication Assessment:  Safety precautions to be maintained throughout the outpatient stay will include: orient to surroundings, keep bed in low position, maintain call bell within reach at all times, provide assistance with transfer out of bed and  ambulation.  Medication Inspection Compliance: Pill count conducted under  aseptic conditions, in front of the patient. Neither the pills nor the bottle was removed from the patient's sight at any time. Once count was completed pills were immediately returned to the patient in their original bottle.  Medication: Hydrocodone/APAP Pill/Patch Count: 47 of 90 pills remain Pill/Patch Appearance: Markings consistent with prescribed medication Bottle Appearance: Standard pharmacy container. Clearly labeled. Filled Date: 77 / 21 / 2019 Last Medication intake:  Today   Pharmacokinetics: Liberation and absorption (onset of action): WNL Distribution (time to peak effect): WNL Metabolism and excretion (duration of action): WNL         Pharmacodynamics: Desired effects: Analgesia: Ms. Thull reports >50% benefit. Functional ability: Patient reports that medication allows her to accomplish basic ADLs Clinically meaningful improvement in function (CMIF): Sustained CMIF goals met Perceived effectiveness: Described as relatively effective, allowing for increase in activities of daily living (ADL) Undesirable effects: Side-effects or Adverse reactions: None reported Monitoring: [place] PMP: Online review of the past 51-monthperiod conducted. Compliant with practice rules and regulations Last UDS on record: Summary  Date Value Ref Range Status  11/27/2017 FINAL  Final    Comment:    ==================================================================== TOXASSURE SELECT 13 (MW) ==================================================================== Test                             Result       Flag       Units Drug Present and Declared for Prescription Verification   Hydrocodone                    291          EXPECTED   ng/mg creat   Hydromorphone                  146          EXPECTED   ng/mg creat   Dihydrocodeine                 141          EXPECTED   ng/mg creat   Norhydrocodone                 833           EXPECTED   ng/mg creat    Sources of hydrocodone include scheduled prescription    medications. Hydromorphone, dihydrocodeine and norhydrocodone are    expected metabolites of hydrocodone. Hydromorphone and    dihydrocodeine are also available as scheduled prescription    medications. ==================================================================== Test                      Result    Flag   Units      Ref Range   Creatinine              78               mg/dL      >=20 ==================================================================== Declared Medications:  The flagging and interpretation on this report are based on the  following declared medications.  Unexpected results may arise from  inaccuracies in the declared medications.  **Note: The testing scope of this panel includes these medications:  Hydrocodone (Hydrocodone-Acetaminophen)  **Note: The testing scope of this panel does not include following  reported medications:  Acetaminophen (Hydrocodone-Acetaminophen)  Albuterol  Aspirin (Aspirin 81)  Cetirizine  Cyclobenzaprine  Dicyclomine  Fluticasone  Linaclotide  Lisinopril  Metformin  Mirtazapine  Pantoprazole  Polyethylene Glycol  Pregabalin  Ranitidine  Rizatriptan (Maxalt)  Rosuvastatin (Crestor)  Sucralfate (Carafate)  Topiramate (Topamax)  Trazodone (Desyrel) ==================================================================== For clinical consultation, please call [phone]. ====================================================================    UDS interpretation: Compliant          Medication Assessment Form: Reviewed. Patient indicates being compliant with therapy Treatment compliance: Compliant Risk Assessment Profile: Aberrant behavior: See prior evaluations. None observed or detected today Comorbid factors increasing risk of overdose: See prior notes. No additional risks detected today Opioid risk tool (ORT) (Total Score):  11 Personal History of Substance Abuse (SUD-Substance use disorder):  Alcohol: Negative  Illegal Drugs: Positive Female or Female  Rx Drugs: Negative  ORT Risk Level calculation: High Risk Risk of substance use disorder (SUD): High Opioid Risk Tool - 12/29/17 0922      Family History of Substance Abuse   Alcohol  Positive Female    Illegal Drugs  Positive Female    Rx Drugs  Positive Female or Female      Personal History of Substance Abuse   Alcohol  Negative    Illegal Drugs  Positive Female or Female    Rx Drugs  Negative      Age   Age between 64-45 years   No      History of Preadolescent Sexual Abuse   History of Preadolescent Sexual Abuse  Negative or Female      Psychological Disease   Psychological Disease  Negative    Depression  Negative      Total Score   Opioid Risk Tool Scoring  11    Opioid Risk Interpretation  High Risk      ORT Scoring interpretation table:  Score <3 = Low Risk for SUD  Score between 4-7 = Moderate Risk for SUD  Score >8 = High Risk for Opioid Abuse   Risk Mitigation Strategies:  Patient Counseling: Covered Patient-Prescriber Agreement (PPA): Present and active  Notification to other healthcare providers: Done  Pharmacologic Plan: No change in therapy, at this time.             Laboratory Chemistry  Inflammation Markers (CRP: Acute Phase) (ESR: Chronic Phase) Lab Results  Component Value Date   CRP <1 11/27/2017   ESRSEDRATE 17 11/27/2017   LATICACIDVEN 1.6 10/14/2016                         Rheumatology Markers No results found for: RF, ANA, LABURIC, URICUR, LYMEIGGIGMAB, LYMEABIGMQN, HLAB27                      Renal Function Markers Lab Results  Component Value Date   BUN 10 12/07/2017   CREATININE 0.74 12/07/2017   BCR 14 11/27/2017   GFRAA >60 12/07/2017   GFRNONAA >60 12/07/2017                             Hepatic Function Markers Lab Results  Component Value Date   AST 22 12/07/2017   ALT 28 12/07/2017    ALBUMIN 4.0 12/07/2017   ALKPHOS 74 12/07/2017   LIPASE 35 12/07/2017                        Electrolytes Lab Results  Component Value Date   NA 143 12/07/2017   K 3.7 12/07/2017   CL 112 (H) 12/07/2017   CALCIUM 9.4 12/07/2017   MG  1.8 11/27/2017                        Neuropathy Markers Lab Results  Component Value Date   HGBA1C 10.4 (H) 05/02/2016   HIV Non Reactive 10/15/2016                        CNS Tests No results found for: COLORCSF, APPEARCSF, RBCCOUNTCSF, WBCCSF, POLYSCSF, LYMPHSCSF, EOSCSF, PROTEINCSF, GLUCCSF, JCVIRUS, CSFOLI, IGGCSF                      Bone Pathology Markers No results found for: VD25OH, [id number], [id number], [id number], 25OHVITD1, 25OHVITD2, 25OHVITD3, TESTOFREE, TESTOSTERONE                       Coagulation Parameters Lab Results  Component Value Date   PLT 255 12/07/2017                        Cardiovascular Markers Lab Results  Component Value Date   TROPONINI <0.03 10/16/2016   HGB 12.7 12/07/2017   HCT 38.3 12/07/2017                         CA Markers No results found for: CEA, CA125, LABCA2                      Note: Lab results reviewed.  Recent Diagnostic Imaging Results  CT Renal Stone Study CLINICAL DATA:  Abdominal pain left-sided 1 month.  EXAM: CT ABDOMEN AND PELVIS WITHOUT CONTRAST  TECHNIQUE: Multidetector CT imaging of the abdomen and pelvis was performed following the standard protocol without IV contrast.  COMPARISON:  06/26/2017 and 10/14/2016  FINDINGS: Lower chest: Lung bases are clear.  Hepatobiliary: Diffuse low-attenuation of the liver without focal mass. Biliary tree is within normal. Mild cholelithiasis.  Pancreas: Normal.  Spleen: Normal.  Adrenals/Urinary Tract: Adrenal glands are normal. Kidneys are normal in size without hydronephrosis or nephrolithiasis. Ureters and bladder are normal.  Stomach/Bowel: Stomach and small bowel are normal. Appendix is normal. Colon is  unremarkable.  Vascular/Lymphatic: Mild calcified plaque over the abdominal aorta. No adenopathy.  Reproductive: Previous hysterectomy.  Adnexal regions unremarkable.  Other: No free fluid or focal inflammatory change.  Musculoskeletal: Unremarkable.  IMPRESSION: No acute findings in the abdomen/pelvis.  Cholelithiasis.  Diffuse low-attenuation of the liver likely steatosis.  Aortic Atherosclerosis (ICD10-I70.0).  Electronically Signed   By: Marin Olp M.D.   On: 12/07/2017 14:48  Complexity Note: Imaging results reviewed. Results shared with Ms. Minotti, using Layman's terms.                         Meds   Current Outpatient Medications:  .  albuterol (PROVENTIL HFA;VENTOLIN HFA) 108 (90 Base) MCG/ACT inhaler, Inhale 1 puff into the lungs 2 (two) times daily as needed., Disp: , Rfl:  .  aspirin 81 MG tablet, Take 81 mg by mouth daily. , Disp: , Rfl:  .  cetirizine (ZYRTEC) 10 MG chewable tablet, Chew 10 mg by mouth daily., Disp: , Rfl:  .  cyclobenzaprine (FLEXERIL) 10 MG tablet, Take 1 tablet (10 mg total) by mouth 3 (three) times daily., Disp: 30 tablet, Rfl: 0 .  fluticasone (FLONASE) 50 MCG/ACT nasal spray, Place 1 spray into both nostrils daily., Disp: , Rfl:  .  [  START ON 01/11/2018] HYDROcodone-acetaminophen (NORCO/VICODIN) 5-325 MG tablet, Take 1 tablet by mouth 3 (three) times daily as needed., Disp: 90 tablet, Rfl: 0 .  hyoscyamine (LEVBID) 0.375 MG 12 hr tablet, Take 0.375 mg by mouth 3 (three) times daily., Disp: , Rfl:  .  linaclotide (LINZESS) 290 MCG CAPS capsule, Take 290 mcg by mouth daily before breakfast., Disp: , Rfl:  .  lisinopril (PRINIVIL,ZESTRIL) 10 MG tablet, Take 10 mg by mouth daily. , Disp: , Rfl:  .  metFORMIN (GLUCOPHAGE) 1000 MG tablet, Take 1 tablet by mouth 2 (two) times daily., Disp: , Rfl:  .  mirtazapine (REMERON) 15 MG tablet, Take 15 mg by mouth at bedtime., Disp: , Rfl:  .  pantoprazole (PROTONIX) 40 MG tablet, Take 40 mg by  mouth daily., Disp: , Rfl:  .  polyethylene glycol (MIRALAX / GLYCOLAX) packet, Take 17 g by mouth daily., Disp: , Rfl:  .  pregabalin (LYRICA) 25 MG capsule, Take 1 capsule (25 mg total) by mouth 3 (three) times daily., Disp: 90 capsule, Rfl: 0 .  ranitidine (ZANTAC) 300 MG tablet, Take 300 mg by mouth at bedtime., Disp: , Rfl:  .  rizatriptan (MAXALT) 10 MG tablet, Take 10 mg by mouth as needed for migraine. May repeat in 2 hours if needed, Disp: , Rfl:  .  rosuvastatin (CRESTOR) 20 MG tablet, Take 20 mg by mouth daily., Disp: , Rfl:  .  sucralfate (CARAFATE) 1 g tablet, Take 1 g by mouth 4 (four) times daily -  with meals and at bedtime., Disp: , Rfl:  .  topiramate (TOPAMAX) 25 MG tablet, Take 25 mg by mouth 2 (two) times daily., Disp: , Rfl:  .  traZODone (DESYREL) 100 MG tablet, Take 1 tablet by mouth at bedtime as needed., Disp: , Rfl:   ROS  Constitutional: Denies any fever or chills Gastrointestinal: No reported hemesis, hematochezia, vomiting, or acute GI distress Musculoskeletal: Denies any acute onset joint swelling, redness, loss of ROM, or weakness Neurological: No reported episodes of acute onset apraxia, aphasia, dysarthria, agnosia, amnesia, paralysis, loss of coordination, or loss of consciousness  Allergies  [name] is allergic to tramadol; benadryl [diphenhydramine hcl]; bextra [valdecoxib]; invokana [canagliflozin]; and sulfamethoxazole-trimethoprim.  PFSH  Drug: [name]  reports that she does not use drugs. Alcohol:  reports that she does not drink alcohol. Tobacco:  reports that she has been smoking cigarettes. She has been smoking about 0.50 packs per day. She uses smokeless tobacco. Medical:  has a past medical history of Asthma, Asthma, BPPV (benign paroxysmal positional vertigo), Cardiac murmur, unspecified (08/28/2016), Degenerative disc disease, lumbar, Depression, Diabetes mellitus, Fatty liver, Fibromyalgia, Fibromyalgia, GERD (gastroesophageal  reflux disease), History of degenerative disc disease, Hyperlipidemia, and Hypertension. Surgical: [name]  has a past surgical history that includes Abdominal surgery; Abdominal hysterectomy; Esophagogastroduodenoscopy (egd) with propofol (N/A, 02/20/2017); Colonoscopy with propofol (N/A, 02/20/2017); Upper esophageal endoscopic ultrasound (eus) (N/A, 04/03/2017); and Diverticulitis. Family: family history includes Diabetes in her mother; Hypertension in her mother.  Constitutional Exam  General appearance: Well nourished, well developed, and well hydrated. In no apparent acute distress Vitals:   12/29/17 0912  BP: (!) 141/95  Pulse: 97  Temp: 97.8 F (36.6 C)  SpO2: 98%  Weight: 124 lb (56.2 kg)  Height: '4\' 11"'  (1.499 m)   BMI Assessment: Estimated body mass index is 25.04 kg/m as calculated from the following:   Height as of this encounter: '4\' 11"'  (1.499 m).   Weight as of this  encounter: 124 lb (56.2 kg). Psych/Mental status: Alert, oriented x 3 (person, place, & time)       Eyes: PERLA Respiratory: No evidence of acute respiratory distress   Lumbar Spine Area Exam  Skin & Axial Inspection: No masses, redness, or swelling Alignment: Symmetrical Functional ROM: Unrestricted ROM       Stability: No instability detected Muscle Tone/Strength: Functionally intact. No obvious neuro-muscular anomalies detected. Sensory (Neurological): Unimpaired Palpation: Tender        Gait & Posture Assessment  Ambulation: Unassisted Gait: Relatively normal for age and body habitus Posture: WNL   Lower Extremity Exam    Side: Right lower extremity  Side: Left lower extremity  Stability: No instability observed          Stability: No instability observed          Skin & Extremity Inspection: Skin color, temperature, and hair growth are WNL. No peripheral edema or cyanosis. No masses, redness, swelling, asymmetry, or associated skin lesions. No contractures.  Skin & Extremity Inspection:  Skin color, temperature, and hair growth are WNL. No peripheral edema or cyanosis. No masses, redness, swelling, asymmetry, or associated skin lesions. No contractures.  Functional ROM: Unrestricted ROM                  Functional ROM: Unrestricted ROM                  Muscle Tone/Strength: Functionally intact. No obvious neuro-muscular anomalies detected.  Muscle Tone/Strength: Functionally intact. No obvious neuro-muscular anomalies detected.  Sensory (Neurological): Unimpaired  Sensory (Neurological): Unimpaired  Palpation: No palpable anomalies  Palpation: No palpable anomalies   Assessment  Primary Diagnosis & Pertinent Problem List: The primary encounter diagnosis was Lumbar radiculopathy. Diagnoses of DDD (degenerative disc disease), lumbar, Chronic myofascial pain, and Chronic pain syndrome were also pertinent to this visit.  Status Diagnosis  Persistent Persistent Worsening 1. Lumbar radiculopathy   2. DDD (degenerative disc disease), lumbar   3. Chronic myofascial pain   4. Chronic pain syndrome     Problems updated and reviewed during this visit: Problem  Cardiac Murmur   Plan of Care  Pharmacotherapy (Medications Ordered): Meds ordered this encounter  Medications  . HYDROcodone-acetaminophen (NORCO/VICODIN) 5-325 MG tablet    Sig: Take 1 tablet by mouth 3 (three) times daily as needed.    Dispense:  90 tablet    Refill:  0    Do not add this medication to the electronic "Automatic Refill" notification system. Patient may have prescription filled one day early if pharmacy is closed on scheduled refill date.    Order Specific Question:   Supervising Provider    Answer:   Milinda Pointer [phone]  . pregabalin (LYRICA) 25 MG capsule    Sig: Take 1 capsule (25 mg total) by mouth 3 (three) times daily.    Dispense:  90 capsule    Refill:  0    Order Specific Question:   Supervising Provider    AnswerMilinda Pointer [phone]   New Prescriptions   No  medications on file   Medications administered today: [name] had no medications administered during this visit. Lab-work, procedure(s), and/or referral(s): No orders of the defined types were placed in this encounter.  Imaging and/or referral(s): None  Interventional therapies: Planned, scheduled, and/or pending:   Not at this time.   Provider-requested follow-up: Return in about 5 weeks (around 02/02/2018) for MedMgmt.  Future Appointments  Date Time Provider [place]  01/28/2018  8:30 AM Vevelyn Francois, NP [facility] None   Primary Care Physician: Sharyne Peach, MD Location: [facility] Outpatient Pain Management Facility Note by: Vevelyn Francois NP Date: 12/29/2017; Time: 4:12 PM  Pain Score Disclaimer: We use the NRS-11 scale. This is a self-reported, subjective measurement of pain severity with only modest accuracy. It is used primarily to identify changes within a particular patient. It must be understood that outpatient pain scales are significantly less accurate that those used for research, where they can be applied under ideal controlled circumstances with minimal exposure to variables. In reality, the score is likely to be a combination of pain intensity and pain affect, where pain affect describes the degree of emotional arousal or changes in action readiness caused by the sensory experience of pain. Factors such as social and work situation, setting, emotional state, anxiety levels, expectation, and prior pain experience may influence pain perception and show large inter-individual differences that may also be affected by time variables.  Patient instructions provided during this appointment: Patient Instructions  ____________________________________________________________________________________________  Medication Rules  Applies to: All patients receiving prescriptions (written or electronic).  Pharmacy of record: Pharmacy where electronic  prescriptions will be sent. If written prescriptions are taken to a different pharmacy, please inform the nursing staff. The pharmacy listed in the electronic medical record should be the one where you would like electronic prescriptions to be sent.  Prescription refills: Only during scheduled appointments. Applies to both, written and electronic prescriptions.  NOTE: The following applies primarily to controlled substances (Opioid* Pain Medications).   Patient's responsibilities: 1. Pain Pills: Bring all pain pills to every appointment (except for procedure appointments). 2. Pill Bottles: Bring pills in original pharmacy bottle. Always bring newest bottle. Bring bottle, even if empty. 3. Medication refills: You are responsible for knowing and keeping track of what medications you need refilled. The day before your appointment, write a list of all prescriptions that need to be refilled. Bring that list to your appointment and give it to the admitting nurse. Prescriptions will be written only during appointments. If you forget a medication, it will not be "Called in", "Faxed", or "electronically sent". You will need to get another appointment to get these prescribed. 4. Prescription Accuracy: You are responsible for carefully inspecting your prescriptions before leaving our office. Have the discharge nurse carefully go over each prescription with you, before taking them home. Make sure that your name is accurately spelled, that your address is correct. Check the name and dose of your medication to make sure it is accurate. Check the number of pills, and the written instructions to make sure they are clear and accurate. Make sure that you are given enough medication to last until your next medication refill appointment. 5. Taking Medication: Take medication as prescribed. Never take more pills than instructed. Never take medication more frequently than prescribed. Taking less pills or less frequently is  permitted and encouraged, when it comes to controlled substances (written prescriptions).  6. Inform other Doctors: Always inform, all of your healthcare providers, of all the medications you take. 7. Pain Medication from other Providers: You are not allowed to accept any additional pain medication from any other Doctor or Healthcare provider. There are two exceptions to this rule. (see below) In the event that you require additional pain medication, you are responsible for notifying us, as stated below. 8. Medication Agreement: You are responsible for carefully reading and following our Medication Agreement. This must be signed before receiving  any prescriptions from our practice. Safely store a copy of your signed Agreement. Violations to the Agreement will result in no further prescriptions. (Additional copies of our Medication Agreement are available upon request.) 9. Laws, Rules, & Regulations: All patients are expected to follow all Federal and [employer], Rules, [employer]. Ignorance of the Laws does not constitute a valid excuse. The use of any illegal substances is prohibited. 10. Adopted CDC guidelines & recommendations: Target dosing levels will be at or below 60 MME/day. Use of benzodiazepines** is not recommended.  Exceptions: There are only two exceptions to the rule of not receiving pain medications from other Healthcare Providers. 1. Exception #1 (Emergencies): In the event of an emergency (i.e.: accident requiring emergency care), you are allowed to receive additional pain medication. However, you are responsible for: As soon as you are able, call our office [phone], at any time of the day or night, and leave a message stating your name, the date and nature of the emergency, and the name and dose of the medication prescribed. In the event that your call is answered by a member of our staff, make sure to document and save the date, time, and the name of the person that  took your information.  2. Exception #2 (Planned Surgery): In the event that you are scheduled by another doctor or dentist to have any type of surgery or procedure, you are allowed (for a period no longer than 30 days), to receive additional pain medication, for the acute post-op pain. However, in this case, you are responsible for picking up a copy of our "Post-op Pain Management for Surgeons" handout, and giving it to your surgeon or dentist. This document is available at our office, and does not require an appointment to obtain it. Simply go to our office during business hours (Monday-Thursday from 8:00 AM to 4:00 PM) (Friday 8:00 AM to 12:00 Noon) or if you have a scheduled appointment with [name], prior to your surgery, and ask for it by name. In addition, you will need to provide [name] with your name, name of your surgeon, type of surgery, and date of procedure or surgery.  *Opioid medications include: morphine, codeine, oxycodone, oxymorphone, hydrocodone, hydromorphone, meperidine, tramadol, tapentadol, buprenorphine, fentanyl, methadone. **Benzodiazepine medications include: diazepam (Valium), alprazolam (Xanax), clonazepam (Klonopine), lorazepam (Ativan), clorazepate (Tranxene), chlordiazepoxide (Librium), estazolam (Prosom), oxazepam (Serax), temazepam (Restoril), triazolam (Halcion) (Last updated: 05/22/2017) ____________________________________________________________________________________________

## 2017-12-29 NOTE — Telephone Encounter (Signed)
She has failed Gabapentin in the past. I was going to prescribe Cymbalta to the patient but insurance approved the Lyrica before the patients appt 2 months ago. So please find out who I need to call for a peer to peer.

## 2017-12-29 NOTE — Patient Instructions (Signed)
____________________________________________________________________________________________  Medication Rules  Applies to: All patients receiving prescriptions (written or electronic).  Pharmacy of record: Pharmacy where electronic prescriptions will be sent. If written prescriptions are taken to a different pharmacy, please inform the nursing staff. The pharmacy listed in the electronic medical record should be the one where you would like electronic prescriptions to be sent.  Prescription refills: Only during scheduled appointments. Applies to both, written and electronic prescriptions.  NOTE: The following applies primarily to controlled substances (Opioid* Pain Medications).   Patient's responsibilities: 1. Pain Pills: Bring all pain pills to every appointment (except for procedure appointments). 2. Pill Bottles: Bring pills in original pharmacy bottle. Always bring newest bottle. Bring bottle, even if empty. 3. Medication refills: You are responsible for knowing and keeping track of what medications you need refilled. The day before your appointment, write a list of all prescriptions that need to be refilled. Bring that list to your appointment and give it to the admitting nurse. Prescriptions will be written only during appointments. If you forget a medication, it will not be "Called in", "Faxed", or "electronically sent". You will need to get another appointment to get these prescribed. 4. Prescription Accuracy: You are responsible for carefully inspecting your prescriptions before leaving our office. Have the discharge nurse carefully go over each prescription with you, before taking them home. Make sure that your name is accurately spelled, that your address is correct. Check the name and dose of your medication to make sure it is accurate. Check the number of pills, and the written instructions to make sure they are clear and accurate. Make sure that you are given enough medication to last  until your next medication refill appointment. 5. Taking Medication: Take medication as prescribed. Never take more pills than instructed. Never take medication more frequently than prescribed. Taking less pills or less frequently is permitted and encouraged, when it comes to controlled substances (written prescriptions).  6. Inform other Doctors: Always inform, all of your healthcare providers, of all the medications you take. 7. Pain Medication from other Providers: You are not allowed to accept any additional pain medication from any other Doctor or Healthcare provider. There are two exceptions to this rule. (see below) In the event that you require additional pain medication, you are responsible for notifying us, as stated below. 8. Medication Agreement: You are responsible for carefully reading and following our Medication Agreement. This must be signed before receiving any prescriptions from our practice. Safely store a copy of your signed Agreement. Violations to the Agreement will result in no further prescriptions. (Additional copies of our Medication Agreement are available upon request.) 9. Laws, Rules, & Regulations: All patients are expected to follow all Federal and State Laws, Statutes, Rules, & Regulations. Ignorance of the Laws does not constitute a valid excuse. The use of any illegal substances is prohibited. 10. Adopted CDC guidelines & recommendations: Target dosing levels will be at or below 60 MME/day. Use of benzodiazepines** is not recommended.  Exceptions: There are only two exceptions to the rule of not receiving pain medications from other Healthcare Providers. 1. Exception #1 (Emergencies): In the event of an emergency (i.e.: accident requiring emergency care), you are allowed to receive additional pain medication. However, you are responsible for: As soon as you are able, call our office [phone], at any time of the day or night, and leave a message stating your name, the  date and nature of the emergency, and the name and dose of the medication   prescribed. In the event that your call is answered by a member of our staff, make sure to document and save the date, time, and the name of the person that took your information.  2. Exception #2 (Planned Surgery): In the event that you are scheduled by another doctor or dentist to have any type of surgery or procedure, you are allowed (for a period no longer than 30 days), to receive additional pain medication, for the acute post-op pain. However, in this case, you are responsible for picking up a copy of our "Post-op Pain Management for Surgeons" handout, and giving it to your surgeon or dentist. This document is available at our office, and does not require an appointment to obtain it. Simply go to our office during business hours (Monday-Thursday from 8:00 AM to 4:00 PM) (Friday 8:00 AM to 12:00 Noon) or if you have a scheduled appointment with us, prior to your surgery, and ask for it by name. In addition, you will need to provide us with your name, name of your surgeon, type of surgery, and date of procedure or surgery.  *Opioid medications include: morphine, codeine, oxycodone, oxymorphone, hydrocodone, hydromorphone, meperidine, tramadol, tapentadol, buprenorphine, fentanyl, methadone. **Benzodiazepine medications include: diazepam (Valium), alprazolam (Xanax), clonazepam (Klonopine), lorazepam (Ativan), clorazepate (Tranxene), chlordiazepoxide (Librium), estazolam (Prosom), oxazepam (Serax), temazepam (Restoril), triazolam (Halcion) (Last updated: 05/22/2017) ____________________________________________________________________________________________    

## 2017-12-30 ENCOUNTER — Telehealth: Payer: Self-pay | Admitting: *Deleted

## 2017-12-30 NOTE — Telephone Encounter (Signed)
Lyrica, it was approved for the patient in the past.

## 2017-12-30 NOTE — Telephone Encounter (Signed)
Crystal,             Please let me know where things are with this and I will do whatever I need to facilitate it. I need clarification. Are we prescribing Cymbalta or Lyrica? Which one needs a peer to peer? Please advise.      Thank you- Victorino Dike

## 2017-12-30 NOTE — Telephone Encounter (Signed)
Called Resubmit PA  Pt has tired and failed Gabapentin 10/28/17 Pt was given RX of Lyrica and it is effective

## 2017-12-30 NOTE — Telephone Encounter (Signed)
Resubmitted 12/30/2017 at 1515 pm per Victorino Dike.

## 2017-12-30 NOTE — Telephone Encounter (Signed)
Crystal the phone number to dial for this PA is [phone].

## 2018-01-05 ENCOUNTER — Telehealth: Payer: Self-pay

## 2018-01-05 NOTE — Telephone Encounter (Signed)
There has been an opening in the EUS schedule for 10/17. Called and offered time slot to [name]. She confirmed that she would like to have this opening. Reviewed instructions and notified endoscopy to make date change. Oncology Nurse Navigator Documentation  Navigator Location: [facility] (01/05/18 1000)   )Navigator Encounter Type: Telephone (01/05/18 1000) Telephone: Outgoing Call (01/05/18 1000)                               Coordination of Care: EUS (01/05/18 1000)                  Time Spent with Patient: 15 (01/05/18 1000)

## 2018-01-08 ENCOUNTER — Encounter
Admission: RE | Disposition: A | Discharge status: Home / Self Care | Payer: Self-pay | Source: Ambulatory Visit | Attending: Internal Medicine

## 2018-01-08 ENCOUNTER — Ambulatory Visit
Admission: RE | Admit: 2018-01-08 | Discharge: 2018-01-08 | Disposition: A | Discharge status: Home / Self Care | Payer: Medicaid Other | Source: Ambulatory Visit | Attending: Internal Medicine | Admitting: Internal Medicine

## 2018-01-08 ENCOUNTER — Encounter: Payer: Self-pay | Admitting: Anesthesiology

## 2018-01-08 ENCOUNTER — Ambulatory Visit: Payer: Medicaid Other | Admitting: Anesthesiology

## 2018-01-08 DIAGNOSIS — F17210 Nicotine dependence, cigarettes, uncomplicated: Secondary | ICD-10-CM | POA: Insufficient documentation

## 2018-01-08 DIAGNOSIS — Z7982 Long term (current) use of aspirin: Secondary | ICD-10-CM | POA: Insufficient documentation

## 2018-01-08 DIAGNOSIS — J449 Chronic obstructive pulmonary disease, unspecified: Secondary | ICD-10-CM | POA: Insufficient documentation

## 2018-01-08 DIAGNOSIS — E119 Type 2 diabetes mellitus without complications: Secondary | ICD-10-CM | POA: Diagnosis not present

## 2018-01-08 DIAGNOSIS — I10 Essential (primary) hypertension: Secondary | ICD-10-CM | POA: Insufficient documentation

## 2018-01-08 DIAGNOSIS — E782 Mixed hyperlipidemia: Secondary | ICD-10-CM | POA: Insufficient documentation

## 2018-01-08 DIAGNOSIS — Z7951 Long term (current) use of inhaled steroids: Secondary | ICD-10-CM | POA: Insufficient documentation

## 2018-01-08 DIAGNOSIS — K219 Gastro-esophageal reflux disease without esophagitis: Secondary | ICD-10-CM | POA: Insufficient documentation

## 2018-01-08 DIAGNOSIS — F329 Major depressive disorder, single episode, unspecified: Secondary | ICD-10-CM | POA: Insufficient documentation

## 2018-01-08 DIAGNOSIS — Z79899 Other long term (current) drug therapy: Secondary | ICD-10-CM | POA: Insufficient documentation

## 2018-01-08 DIAGNOSIS — K3189 Other diseases of stomach and duodenum: Secondary | ICD-10-CM | POA: Diagnosis present

## 2018-01-08 DIAGNOSIS — Z7984 Long term (current) use of oral hypoglycemic drugs: Secondary | ICD-10-CM | POA: Insufficient documentation

## 2018-01-08 HISTORY — DX: Headache: R51

## 2018-01-08 HISTORY — DX: Headache, unspecified: R51.9

## 2018-01-08 HISTORY — DX: Diverticulitis of intestine, part unspecified, without perforation or abscess without bleeding: K57.92

## 2018-01-08 HISTORY — PX: EUS: SHX5427

## 2018-01-08 LAB — GLUCOSE, CAPILLARY: Glucose-Capillary: 131 mg/dL — ABNORMAL HIGH (ref 70–99)

## 2018-01-08 SURGERY — UPPER ENDOSCOPIC ULTRASOUND (EUS) RADIAL
Anesthesia: General

## 2018-01-08 MED ORDER — EPHEDRINE SULFATE 50 MG/ML IJ SOLN
INTRAMUSCULAR | Status: DC | PRN
  Administered 2018-01-08 (×3): 5 mg via INTRAVENOUS

## 2018-01-08 MED ORDER — MIDAZOLAM HCL 2 MG/2ML IJ SOLN
INTRAMUSCULAR | Status: DC | PRN
  Administered 2018-01-08: 1 mg via INTRAVENOUS

## 2018-01-08 MED ORDER — PROPOFOL 500 MG/50ML IV EMUL
INTRAVENOUS | Status: AC
  Filled 2018-01-08: qty 50

## 2018-01-08 MED ORDER — MIDAZOLAM HCL 2 MG/2ML IJ SOLN
INTRAMUSCULAR | Status: AC
  Filled 2018-01-08: qty 2

## 2018-01-08 MED ORDER — LIDOCAINE HCL (CARDIAC) PF 100 MG/5ML IV SOSY
PREFILLED_SYRINGE | INTRAVENOUS | Status: DC | PRN
  Administered 2018-01-08: 30 mg via INTRAVENOUS

## 2018-01-08 MED ORDER — PROPOFOL 500 MG/50ML IV EMUL
INTRAVENOUS | Status: DC | PRN
  Administered 2018-01-08: 100 ug/kg/min via INTRAVENOUS

## 2018-01-08 MED ORDER — FENTANYL CITRATE (PF) 100 MCG/2ML IJ SOLN
INTRAMUSCULAR | Status: AC
  Filled 2018-01-08: qty 2

## 2018-01-08 MED ORDER — EPHEDRINE SULFATE 50 MG/ML IJ SOLN
INTRAMUSCULAR | Status: AC
  Filled 2018-01-08: qty 1

## 2018-01-08 MED ORDER — PHENYLEPHRINE HCL 10 MG/ML IJ SOLN
INTRAMUSCULAR | Status: AC
  Filled 2018-01-08: qty 1

## 2018-01-08 MED ORDER — SODIUM CHLORIDE 0.9 % IV SOLN
INTRAVENOUS | Status: DC
  Administered 2018-01-08: 1000 mL via INTRAVENOUS

## 2018-01-08 MED ORDER — PHENYLEPHRINE HCL 10 MG/ML IJ SOLN
INTRAMUSCULAR | Status: DC | PRN
  Administered 2018-01-08 (×2): 100 ug via INTRAVENOUS
  Administered 2018-01-08: 50 ug via INTRAVENOUS
  Administered 2018-01-08 (×2): 100 ug via INTRAVENOUS

## 2018-01-08 MED ORDER — FENTANYL CITRATE (PF) 100 MCG/2ML IJ SOLN
INTRAMUSCULAR | Status: DC | PRN
  Administered 2018-01-08: 50 ug via INTRAVENOUS

## 2018-01-08 MED ORDER — LIDOCAINE HCL (PF) 2 % IJ SOLN
INTRAMUSCULAR | Status: AC
  Filled 2018-01-08: qty 10

## 2018-01-08 NOTE — Transfer of Care (Signed)
Immediate Anesthesia Transfer of Care Note  Patient: [name]  Procedure(s) Performed: UPPER ENDOSCOPIC ULTRASOUND (EUS) RADIAL (N/A )  Patient Location: PACU  Anesthesia Type:General  Level of Consciousness: awake and sedated  Airway & Oxygen Therapy: Patient Spontanous Breathing and Patient connected to nasal cannula oxygen  Post-op Assessment: Report given to RN and Post -op Vital signs reviewed and stable  Post vital signs: Reviewed and stable  Last Vitals:  Vitals Value Taken Time  BP    Temp    Pulse    Resp    SpO2      Last Pain:  Vitals:   01/08/18 1325  TempSrc: Tympanic  PainSc: 8       Patients Stated Pain Goal: 0 (01/08/18 1325)  Complications: No apparent anesthesia complications

## 2018-01-08 NOTE — Anesthesia Post-op Follow-up Note (Signed)
Anesthesia QCDR form completed.        

## 2018-01-08 NOTE — Discharge Instructions (Signed)
Discharge to home °

## 2018-01-08 NOTE — Anesthesia Procedure Notes (Signed)
Performed by: Cook-Martin, Eissa Buchberger Pre-anesthesia Checklist: Patient identified, Emergency Drugs available, Suction available, Patient being monitored and Timeout performed Patient Re-evaluated:Patient Re-evaluated prior to induction Oxygen Delivery Method: Nasal cannula Preoxygenation: Pre-oxygenation with 100% oxygen Induction Type: IV induction Airway Equipment and Method: Bite block Placement Confirmation: CO2 detector and positive ETCO2       

## 2018-01-08 NOTE — Op Note (Signed)
[facility] Patient Name: [name] Procedure Date: 01/08/2018 1:41 PM MRN: [id number] Account #: [id number] Date of Birth: 1960/04/05 Admit Type: Outpatient Age: 57 Room: [facility] ENDO ROOM 3 Gender: Female Note Status: Finalized Procedure:            Upper GI endoscopy Indications:          Surveillance procedure, Established gastric antrum                        nodule seen on prior examination, For planned removal                        of gastric antrum nodule Patient Profile:      Refer to note in patient chart for documentation of                        history and physical. Providers:            Murray Hodgkins. [place] Referring MD:         Sionne A. Iona Beard MD, MD (Referring MD) Dr Tana Conch Medicines:            Propofol per Anesthesia Complications:        No immediate complications. Procedure:            Pre-Anesthesia Assessment:                       Prior to the procedure, a History and Physical was                        performed, and patient medications and allergies were                        reviewed. The patient is competent. The risks and                        benefits of the procedure and the sedation options and                        risks were discussed with the patient. All questions                        were answered and informed consent was obtained.                        Patient identification and proposed procedure were                        verified by the physician, the nurse and the                        anesthetist in the pre-procedure area. Mental Status                        Examination: alert and oriented. Airway Examination:                        normal oropharyngeal airway and neck mobility.  Respiratory Examination: clear to auscultation. CV                        Examination: normal. Prophylactic Antibiotics: The                        patient  does not require prophylactic antibiotics.                        Prior Anticoagulants: The patient has taken no previous                        anticoagulant or antiplatelet agents. ASA Grade                        Assessment: II - A patient with mild systemic disease.                        After reviewing the risks and benefits, the patient was                        deemed in satisfactory condition to undergo the                        procedure. The anesthesia plan was to use monitored                        anesthesia care (MAC). Immediately prior to                        administration of medications, the patient was                        re-assessed for adequacy to receive sedatives. The                        heart rate, respiratory rate, oxygen saturations, blood                        pressure, adequacy of pulmonary ventilation, and                        response to care were monitored throughout the                        procedure. The physical status of the patient was                        re-assessed after the procedure.                       After obtaining informed consent, the endoscope was                        passed under direct vision. Throughout the procedure,                        the patient's blood pressure, pulse, and oxygen                        saturations were monitored continuously.  The Endoscope                        was introduced through the mouth, and advanced to the                        second part of duodenum. The upper GI endoscopy was                        accomplished without difficulty. The patient tolerated                        the procedure well. Findings:      The esophagus was normal.      A single medium submucosal nodule was found on the greater curvature of       the gastric antrum. Preparations were made for mucosal resection.       Eleview was injected to raise the lesion. Snare mucosal resection was       performed.  Resection was incomplete as the snare kept slipping off the       top of the lesion. The resected tissue was retrieved. The mucosa       overlying the nodule was unroofed with a hot snare. Biopsies of the       lesion were then taken with a cold jumbo forceps for histology. To       prevent bleeding after mucosal resection, three hemostatic clips were       successfully placed (MR conditional). There was no bleeding at the end       of the procedure.      The examined duodenum was normal. Impression:           - Normal esophagus.                       - A single submucosal nodule was found in the stomach.                        The overlying surface of the nodule was unroofed and                        the lesion was then biopsied with cold jumbo forceps.                        The site was then clip closed.                       - Normal examined duodenum. Recommendation:       - Discharge patient to home (ambulatory).                       - Await pathology results.                       - No further endoscopic surveillance procedures                        necessary if pathology confirms a benign lesion.                       - Repeat EGD/EUS examination in 1 year if pathology is  nondiagnostic.                       - The findings and recommendations were discussed with                        the patient and her family.                       - Return to referring physician as previously scheduled. Procedure Code(s):    --- Professional ---                       [id number], Esophagogastroduodenoscopy, flexible, transoral;                        with endoscopic mucosal resection                       [id number], 59,51, Esophagogastroduodenoscopy, flexible,                        transoral; with biopsy, single or multiple Diagnosis Code(s):    --- Professional ---                       K31.7, Polyp of stomach and duodenum                       K31.89, Other diseases of  stomach and duodenum CPT copyright 2018 American Medical Association. All rights reserved. The codes documented in this report are preliminary and upon coder review may  be revised to meet current compliance requirements. Attending Participation:      I personally performed the entire procedure without the assistance of a       fellow, resident or surgical assistant. [place],  01/08/2018 2:28:07 PM This report has been signed electronically. Number of Addenda: 0 Note Initiated On: 01/08/2018 1:41 PM Estimated Blood Loss: Estimated blood loss: none.      [facility]

## 2018-01-08 NOTE — Interval H&P Note (Signed)
History and Physical Interval Note:  01/08/2018 1:40 PM  [name]  has presented today for surgery, with the diagnosis of GASTRIC LESION  The various methods of treatment have been discussed with the patient and family. After consideration of risks, benefits and other options for treatment, the patient has consented to  Procedure(s): UPPER ENDOSCOPIC ULTRASOUND (EUS) RADIAL (N/A) as a surgical intervention .  The patient's history has been reviewed, patient examined, no change in status, stable for surgery.  I have reviewed the patient's chart and labs.  Questions were answered to the patient's satisfaction.     Mike Gip

## 2018-01-09 ENCOUNTER — Telehealth: Payer: Self-pay

## 2018-01-09 NOTE — Anesthesia Preprocedure Evaluation (Signed)
Anesthesia Evaluation  Patient identified by MRN, date of birth, ID band Patient awake    Reviewed: Allergy & Precautions, NPO status , Patient's Chart, lab work & pertinent test results  History of Anesthesia Complications Negative for: history of anesthetic complications  Airway Mallampati: II  TM Distance: >3 FB Neck ROM: Full    Dental no notable dental hx.    Pulmonary asthma , COPD, Current Smoker,    breath sounds clear to auscultation- rhonchi (-) wheezing      Cardiovascular hypertension, Pt. on medications (-) CAD, (-) Past MI, (-) Cardiac Stents and (-) CABG  Rhythm:Regular Rate:Normal - Systolic murmurs and - Diastolic murmurs    Neuro/Psych  Headaches, PSYCHIATRIC DISORDERS Depression    GI/Hepatic Neg liver ROS, GERD  ,  Endo/Other  diabetes, Oral Hypoglycemic Agents  Renal/GU negative Renal ROS     Musculoskeletal  (+) Arthritis , Fibromyalgia -  Abdominal (+) - obese,   Peds  Hematology negative hematology ROS (+)   Anesthesia Other Findings Past Medical History: No date: Asthma No date: Asthma No date: BPPV (benign paroxysmal positional vertigo) 08/28/2016: Cardiac murmur, unspecified No date: Degenerative disc disease, lumbar No date: Depression No date: Diabetes mellitus 10/2016: Diverticulitis No date: Fatty liver No date: Fibromyalgia No date: Fibromyalgia No date: GERD (gastroesophageal reflux disease) No date: Headache No date: History of degenerative disc disease No date: Hyperlipidemia No date: Hypertension   Reproductive/Obstetrics                             Anesthesia Physical Anesthesia Plan  ASA: II  Anesthesia Plan: General   Post-op Pain Management:    Induction: Intravenous  PONV Risk Score and Plan: 1 and Propofol infusion  Airway Management Planned: Natural Airway  Additional Equipment:   Intra-op Plan:   Post-operative Plan:    Informed Consent: I have reviewed the patients History and Physical, chart, labs and discussed the procedure including the risks, benefits and alternatives for the proposed anesthesia with the patient or authorized representative who has indicated his/her understanding and acceptance.   Dental advisory given  Plan Discussed with: CRNA and Anesthesiologist  Anesthesia Plan Comments:         Anesthesia Quick Evaluation

## 2018-01-09 NOTE — Anesthesia Postprocedure Evaluation (Signed)
Anesthesia Post Note  Patient: [name]  Procedure(s) Performed: UPPER ENDOSCOPIC ULTRASOUND (EUS) RADIAL (N/A )  Patient location during evaluation: Endoscopy Anesthesia Type: General Level of consciousness: awake and alert and oriented Pain management: pain level controlled Vital Signs Assessment: post-procedure vital signs reviewed and stable Respiratory status: spontaneous breathing, nonlabored ventilation and respiratory function stable Cardiovascular status: blood pressure returned to baseline and stable Postop Assessment: no signs of nausea or vomiting Anesthetic complications: no     Last Vitals:  Vitals:   01/08/18 1427 01/08/18 1457  BP: 109/81 104/83  Pulse: (!) 110   Resp: (!) 21   Temp: (!) 36.2 C   SpO2: 94%     Last Pain:  Vitals:   01/08/18 1427  TempSrc: Tympanic                 Thersia Petraglia

## 2018-01-09 NOTE — Telephone Encounter (Signed)
EUS report routed to Tawni Pummel PA at [facility]. Oncology Nurse Navigator Documentation  Navigator Location: [facility] (01/09/18 1500)   )Navigator Encounter Type: Letter/Fax/Email (01/09/18 1500)                                 Coordination of Care: EUS (01/09/18 1500)                  Time Spent with Patient: 15 (01/09/18 1500)

## 2018-01-12 ENCOUNTER — Telehealth: Payer: Self-pay

## 2018-01-12 NOTE — Telephone Encounter (Signed)
Spoke with patient, Hydrocodone - apap 5-325 mg is the medication she was calling about.  PA sent, patient informed that I would send this.

## 2018-01-12 NOTE — Telephone Encounter (Signed)
Pt called and is very upset that she cant get her meds, Pharmacy is telling her they need more info. ? Prior auth. Please check on for pt and let her know

## 2018-01-13 LAB — SURGICAL PATHOLOGY

## 2018-01-27 NOTE — Progress Notes (Signed)
Patient's Name: [name]  MRN: [id number]  Referring Provider: Sharyne Peach, MD  DOB: [birth year]  PCP: Sharyne Peach, MD  DOS: 01/28/2018  Note by: Vevelyn Francois NP  Service setting: Ambulatory outpatient  Specialty: Interventional Pain Management  Location: [facility] (AMB) Pain Management Facility    Patient type: Established    Primary Reason(s) for Visit: Encounter for prescription drug management. (Level of risk: moderate)  CC: Back Pain (lower middle ) and Generalized Body Aches (fibromyalgia)  HPI  [name] is a 57 y.o. year old, female patient, who comes today for a medication management evaluation. She has Essential hypertension; Diabetes ([place]); Fibromyalgia; COPD (chronic obstructive pulmonary disease) ([place]); Diverticulitis; Lumbar radiculopathy; Chronic bilateral low back pain with bilateral sciatica; Chronic pain syndrome; Chronic, continuous use of opioids; Chronic myofascial pain; Benign paroxysmal positional vertigo; Cardiac murmur; DDD (degenerative disc disease), lumbar; Depression; Fatty liver disease, nonalcoholic; Primary osteoarthritis of both hips; Hyperlipidemia, mixed; Smoker; Type 2 diabetes mellitus without complications ([place]); Aortic atherosclerosis ([place]); Pharmacologic therapy; Disorder of skeletal system; and Problems influencing health status on their problem list. Her primarily concern today is the Back Pain (lower middle ) and Generalized Body Aches (fibromyalgia)  Pain Assessment: Location: Lower, Mid (fibromyalgia ) Radiating: legs heart  Onset: More than a month ago Duration: Chronic pain Quality: Aching, Dull, Sharp, Throbbing, Discomfort, Constant, Burning Severity: 6 /10 (subjective, self-reported pain score)  Note: Reported level is compatible with observation. Clinically the patient looks like a 1/10 A 1/10 is viewed as "Mild" and described as nagging, annoying, but not interfering with basic activities of daily living (ADL). Ms.  Cook is able to eat, bathe, get dressed, do toileting (being able to get on and off the toilet and perform personal hygiene functions), transfer (move in and out of bed or a chair without assistance), and maintain continence (able to control bladder and bowel functions). Physiologic parameters such as blood pressure and heart rate apear wnl. [name] does not seem to understand the use of our objective pain scale When using our objective Pain Scale, levels between 6 and 10/10 are said to belong in an emergency room, as it progressively worsens from a 6/10, described as severely limiting, requiring emergency care not usually available at an outpatient pain management facility. At a 6/10 level, communication becomes difficult and requires great effort. Assistance to reach the emergency department may be required. Facial flushing and profuse sweating along with potentially dangerous increases in heart rate and blood pressure will be evident. Effect on ADL: the more she does the more pain she has.  tried to have some activity each day  Timing: Constant Modifying factors: pain medications, rest, limit activities.  BP: (!) 145/89  HR: 100  [name] was last scheduled for an appointment on 12/29/2017 for medication management. During today's appointment we reviewed [name]'s chronic pain status, as well as her outpatient medication regimen.  The patient  reports that she does not use drugs. Her body mass index is 25.04 kg/m.  Further details on both, my assessment(s), as well as the proposed treatment plan, please see below.  Controlled Substance Pharmacotherapy Assessment REMS (Risk Evaluation and Mitigation Strategy)  Analgesic:Hydrocodone 5 mg TID prn MME/day:52m/day PJanett Billow RN  01/28/2018  8:42 AM  Sign at close encounter Nursing Pain Medication Assessment:  Safety precautions to be maintained throughout the outpatient stay will include: orient to  surroundings, keep bed in low position, maintain call bell within reach at all times,  provide assistance with transfer out of bed and ambulation.  Medication Inspection Compliance: Pill count conducted under aseptic conditions, in front of the patient. Neither the pills nor the bottle was removed from the patient's sight at any time. Once count was completed pills were immediately returned to the patient in their original bottle.  Medication: Hydrocodone/APAP Pill/Patch Count: 69 of 90 pills remain Pill/Patch Appearance: Markings consistent with prescribed medication Bottle Appearance: Standard pharmacy container. Clearly labeled. Filled Date: 10 / 21 / 2019 Last Medication intake:  Today   Pharmacokinetics: Liberation and absorption (onset of action): WNL Distribution (time to peak effect): WNL Metabolism and excretion (duration of action): WNL         Pharmacodynamics: Desired effects: Analgesia: [name] reports >50% benefit. Functional ability: Patient reports that medication allows her to accomplish basic ADLs Clinically meaningful improvement in function (CMIF): Sustained CMIF goals met Perceived effectiveness: Described as relatively effective, allowing for increase in activities of daily living (ADL) Undesirable effects: Side-effects or Adverse reactions: None reported Monitoring: [place] PMP: Online review of the past 53-monthperiod conducted. Compliant with practice rules and regulations Last UDS on record: Summary  Date Value Ref Range Status  11/27/2017 FINAL  Final    Comment:    ==================================================================== TOXASSURE SELECT 13 (MW) ==================================================================== Test                             Result       Flag       Units Drug Present and Declared for Prescription Verification   Hydrocodone                    291          EXPECTED   ng/mg creat   Hydromorphone                  146           EXPECTED   ng/mg creat   Dihydrocodeine                 141          EXPECTED   ng/mg creat   Norhydrocodone                 833          EXPECTED   ng/mg creat    Sources of hydrocodone include scheduled prescription    medications. Hydromorphone, dihydrocodeine and norhydrocodone are    expected metabolites of hydrocodone. Hydromorphone and    dihydrocodeine are also available as scheduled prescription    medications. ==================================================================== Test                      Result    Flag   Units      Ref Range   Creatinine              78               mg/dL      >=20 ==================================================================== Declared Medications:  The flagging and interpretation on this report are based on the  following declared medications.  Unexpected results may arise from  inaccuracies in the declared medications.  **Note: The testing scope of this panel includes these medications:  Hydrocodone (Hydrocodone-Acetaminophen)  **Note: The testing scope of this panel does not include following  reported medications:  Acetaminophen (Hydrocodone-Acetaminophen)  Albuterol  Aspirin (Aspirin 81)  Cetirizine  Cyclobenzaprine  Dicyclomine  Fluticasone  Linaclotide  Lisinopril  Metformin  Mirtazapine  Pantoprazole  Polyethylene Glycol  Pregabalin  Ranitidine  Rizatriptan (Maxalt)  Rosuvastatin (Crestor)  Sucralfate (Carafate)  Topiramate (Topamax)  Trazodone (Desyrel) ==================================================================== For clinical consultation, please call [phone]. ====================================================================    UDS interpretation: Compliant          Medication Assessment Form: Reviewed. Patient indicates being compliant with therapy Treatment compliance: Compliant Risk Assessment Profile: Aberrant behavior: See prior evaluations. None observed or detected today Comorbid  factors increasing risk of overdose: See prior notes. No additional risks detected today Opioid risk tool (ORT) (Total Score): 7 Personal History of Substance Abuse (SUD-Substance use disorder):  Alcohol: Positive Female or Female  Illegal Drugs: Negative  Rx Drugs: Negative  ORT Risk Level calculation: Moderate Risk Risk of substance use disorder (SUD): Low Opioid Risk Tool - 01/28/18 0929      Family History of Substance Abuse   Alcohol  Positive Female    Illegal Drugs  Negative    Rx Drugs  Negative      Personal History of Substance Abuse   Alcohol  Positive Female or Female    Illegal Drugs  Negative    Rx Drugs  Negative      Age   Age between 68-45 years   No      Psychological Disease   Psychological Disease  Positive    ADD  Negative    OCD  Negative    Bipolar  Negative    Schizophrenia  Negative    Depression  Positive      Total Score   Opioid Risk Tool Scoring  7    Opioid Risk Interpretation  Moderate Risk      ORT Scoring interpretation table:  Score <3 = Low Risk for SUD  Score between 4-7 = Moderate Risk for SUD  Score >8 = High Risk for Opioid Abuse   Risk Mitigation Strategies:  Patient Counseling: Covered Patient-Prescriber Agreement (PPA): Present and active  Notification to other healthcare providers: Done  Pharmacologic Plan: No change in therapy, at this time.             Laboratory Chemistry  Inflammation Markers (CRP: Acute Phase) (ESR: Chronic Phase) Lab Results  Component Value Date   CRP <1 11/27/2017   ESRSEDRATE 17 11/27/2017   LATICACIDVEN 1.6 10/14/2016                         Rheumatology Markers No results found for: RF, ANA, LABURIC, URICUR, LYMEIGGIGMAB, LYMEABIGMQN, HLAB27                      Renal Function Markers Lab Results  Component Value Date   BUN 10 12/07/2017   CREATININE 0.74 12/07/2017   BCR 14 11/27/2017   GFRAA >60 12/07/2017   GFRNONAA >60 12/07/2017                             Hepatic Function  Markers Lab Results  Component Value Date   AST 22 12/07/2017   ALT 28 12/07/2017   ALBUMIN 4.0 12/07/2017   ALKPHOS 74 12/07/2017   LIPASE 35 12/07/2017                        Electrolytes Lab Results  Component Value Date   NA 143 12/07/2017  K 3.7 12/07/2017   CL 112 (H) 12/07/2017   CALCIUM 9.4 12/07/2017   MG 1.8 11/27/2017                        Neuropathy Markers Lab Results  Component Value Date   HGBA1C 10.4 (H) 05/02/2016   HIV Non Reactive 10/15/2016                        CNS Tests No results found for: COLORCSF, APPEARCSF, RBCCOUNTCSF, WBCCSF, POLYSCSF, LYMPHSCSF, EOSCSF, PROTEINCSF, GLUCCSF, JCVIRUS, CSFOLI, IGGCSF                      Bone Pathology Markers No results found for: [place], [id number], [id number], [id number], 25OHVITD1, 25OHVITD2, 25OHVITD3, TESTOFREE, TESTOSTERONE                       Coagulation Parameters Lab Results  Component Value Date   PLT 255 12/07/2017                        Cardiovascular Markers Lab Results  Component Value Date   TROPONINI <0.03 10/16/2016   HGB 12.7 12/07/2017   HCT 38.3 12/07/2017                         CA Markers No results found for: CEA, CA125, LABCA2                      Note: Lab results reviewed.  Recent Diagnostic Imaging Results  CT Renal Stone Study CLINICAL DATA:  Abdominal pain left-sided 1 month.  EXAM: CT ABDOMEN AND PELVIS WITHOUT CONTRAST  TECHNIQUE: Multidetector CT imaging of the abdomen and pelvis was performed following the standard protocol without IV contrast.  COMPARISON:  06/26/2017 and 10/14/2016  FINDINGS: Lower chest: Lung bases are clear.  Hepatobiliary: Diffuse low-attenuation of the liver without focal mass. Biliary tree is within normal. Mild cholelithiasis.  Pancreas: Normal.  Spleen: Normal.  Adrenals/Urinary Tract: Adrenal glands are normal. Kidneys are normal in size without hydronephrosis or nephrolithiasis. Ureters and bladder are  normal.  Stomach/Bowel: Stomach and small bowel are normal. Appendix is normal. Colon is unremarkable.  Vascular/Lymphatic: Mild calcified plaque over the abdominal aorta. No adenopathy.  Reproductive: Previous hysterectomy.  Adnexal regions unremarkable.  Other: No free fluid or focal inflammatory change.  Musculoskeletal: Unremarkable.  IMPRESSION: No acute findings in the abdomen/pelvis.  Cholelithiasis.  Diffuse low-attenuation of the liver likely steatosis.  Aortic Atherosclerosis (ICD10-I70.0).  Electronically Signed   By: Marin Olp M.D.   On: 12/07/2017 14:48  Complexity Note: Imaging results reviewed. Results shared with Ms. Hosie, using Layman's terms.                         Meds   Current Outpatient Medications:  .  ACCU-CHEK GUIDE test strip, USE TID, Disp: , Rfl: 12 .  albuterol (PROVENTIL HFA;VENTOLIN HFA) 108 (90 Base) MCG/ACT inhaler, Inhale 1 puff into the lungs 2 (two) times daily as needed., Disp: , Rfl:  .  aspirin 81 MG tablet, Take 81 mg by mouth daily. , Disp: , Rfl:  .  cetirizine (ZYRTEC) 10 MG chewable tablet, Chew 10 mg by mouth daily., Disp: , Rfl:  .  fluticasone (FLONASE) 50 MCG/ACT nasal spray, Place 1 spray into both nostrils daily., Disp: ,  Rfl:  .  [START ON 02/17/2018] HYDROcodone-acetaminophen (NORCO/VICODIN) 5-325 MG tablet, Take 1 tablet by mouth 3 (three) times daily as needed., Disp: 90 tablet, Rfl: 0 .  hyoscyamine (LEVSIN, ANASPAZ) 0.125 MG tablet, TK 1 T PO Q 6 H PRN FOR ADOMINAL PAIN, Disp: , Rfl: 5 .  LEVEMIR FLEXTOUCH 100 UNIT/ML Pen, INJECT 24 UNITS [place] HS EVERY 4TH DAY INCREASE BY 4 UNITS UNTIL SUGARS ARE CONTROLLED, Disp: , Rfl: 11 .  linaclotide (LINZESS) 290 MCG CAPS capsule, Take 290 mcg by mouth daily before breakfast., Disp: , Rfl:  .  lisinopril (PRINIVIL,ZESTRIL) 10 MG tablet, Take 10 mg by mouth daily. , Disp: , Rfl:  .  metFORMIN (GLUCOPHAGE) 1000 MG tablet, Take 1 tablet by mouth 2 (two) times daily., Disp:  , Rfl:  .  mirtazapine (REMERON) 15 MG tablet, Take 15 mg by mouth at bedtime., Disp: , Rfl:  .  ondansetron (ZOFRAN) 4 MG tablet, TK 1 T PO Q 8 H PRN N, Disp: , Rfl: 0 .  polyethylene glycol (MIRALAX / GLYCOLAX) packet, Take 17 g by mouth daily., Disp: , Rfl:  .  [START ON 02/05/2018] pregabalin (LYRICA) 25 MG capsule, Take 1 capsule (25 mg total) by mouth 3 (three) times daily., Disp: 90 capsule, Rfl: 1 .  ranitidine (ZANTAC) 300 MG tablet, Take 300 mg by mouth at bedtime., Disp: , Rfl:  .  rizatriptan (MAXALT) 10 MG tablet, Take 10 mg by mouth as needed for migraine. May repeat in 2 hours if needed, Disp: , Rfl:  .  rosuvastatin (CRESTOR) 20 MG tablet, Take 20 mg by mouth daily., Disp: , Rfl:  .  sucralfate (CARAFATE) 1 g tablet, Take 1 g by mouth 4 (four) times daily -  with meals and at bedtime., Disp: , Rfl:  .  topiramate (TOPAMAX) 25 MG tablet, Take 25 mg by mouth 2 (two) times daily., Disp: , Rfl:  .  traZODone (DESYREL) 100 MG tablet, Take 1 tablet by mouth at bedtime as needed., Disp: , Rfl:  .  diclofenac sodium (VOLTAREN) 1 % GEL, Apply 4 g topically 4 (four) times daily., Disp: 100 g, Rfl: 1  ROS  Constitutional: Denies any fever or chills Gastrointestinal: No reported hemesis, hematochezia, vomiting, or acute GI distress Musculoskeletal: Denies any acute onset joint swelling, redness, loss of ROM, or weakness Neurological: No reported episodes of acute onset apraxia, aphasia, dysarthria, agnosia, amnesia, paralysis, loss of coordination, or loss of consciousness  Allergies  Ms. Hammersmith is allergic to tramadol; benadryl [diphenhydramine hcl]; bextra [valdecoxib]; invokana [canagliflozin]; and sulfamethoxazole-trimethoprim.  PFSH  Drug: Ms. Wenberg  reports that she does not use drugs. Alcohol:  reports that she does not drink alcohol. Tobacco:  reports that she has been smoking cigarettes. She has been smoking about 0.50 packs per day. She uses smokeless  tobacco. Medical:  has a past medical history of Asthma, Asthma, BPPV (benign paroxysmal positional vertigo), Cardiac murmur, unspecified (08/28/2016), Degenerative disc disease, lumbar, Depression, Diabetes mellitus, Diverticulitis (10/2016), Fatty liver, Fibromyalgia, Fibromyalgia, GERD (gastroesophageal reflux disease), Headache, History of degenerative disc disease, Hyperlipidemia, and Hypertension. Surgical: Ms. Milan  has a past surgical history that includes Abdominal surgery; Abdominal hysterectomy; Esophagogastroduodenoscopy (egd) with propofol (N/A, 02/20/2017); Colonoscopy with propofol (N/A, 02/20/2017); Upper esophageal endoscopic ultrasound (eus) (N/A, 04/03/2017); Diverticulitis; and EUS (N/A, 01/08/2018). Family: family history includes Diabetes in her mother; Hypertension in her mother.  Constitutional Exam  General appearance: Well nourished, well developed, and well hydrated. In no apparent acute distress Vitals:  01/28/18 0830  BP: (!) 145/89  Pulse: 100  Resp: 16  Temp: 98.4 F (36.9 C)  TempSrc: Oral  SpO2: 97%  Weight: 124 lb (56.2 kg)  Height: '4\' 11"'  (1.499 m)   BMI Assessment: Estimated body mass index is 25.04 kg/m as calculated from the following:   Height as of this encounter: '4\' 11"'  (1.499 m).   Weight as of this encounter: 124 lb (56.2 kg). Psych/Mental status: Alert, oriented x 3 (person, place, & time)       Eyes: PERLA Respiratory: No evidence of acute respiratory distress  Upper Extremity (UE) Exam    Side: Right upper extremity  Side: Left upper extremity  Skin & Extremity Inspection: Bouchard's nodes (PIP)  Skin & Extremity Inspection: Bouchard's nodes (PIP)  Functional ROM: Unrestricted ROM          Functional ROM: Unrestricted ROM          Muscle Tone/Strength: Functionally intact. No obvious neuro-muscular anomalies detected.  Muscle Tone/Strength: Functionally intact. No obvious neuro-muscular anomalies detected.  Sensory (Neurological):  Unimpaired          Sensory (Neurological): Unimpaired          Palpation: No palpable anomalies              Palpation: No palpable anomalies               Thoracic Spine Area Exam  Skin & Axial Inspection: No masses, redness, or swelling Alignment: Symmetrical Functional ROM: Unrestricted ROM Stability: No instability detected Muscle Tone/Strength: Functionally intact. No obvious neuro-muscular anomalies detected. Sensory (Neurological): Unimpaired Muscle strength & Tone: No palpable anomalies  Lumbar Spine Area Exam  Skin & Axial Inspection: No masses, redness, or swelling Alignment: Symmetrical Functional ROM: Unrestricted ROM       Stability: No instability detected Muscle Tone/Strength: Functionally intact. No obvious neuro-muscular anomalies detected. Sensory (Neurological): Unimpaired Palpation: No palpable anomalies        Gait & Posture Assessment  Ambulation: Unassisted Gait: Relatively normal for age and body habitus Posture: WNL   Lower Extremity Exam    Side: Right lower extremity  Side: Left lower extremity  Stability: No instability observed          Stability: No instability observed          Skin & Extremity Inspection: Skin color, temperature, and hair growth are WNL. No peripheral edema or cyanosis. No masses, redness, swelling, asymmetry, or associated skin lesions. No contractures.  Skin & Extremity Inspection: Skin color, temperature, and hair growth are WNL. No peripheral edema or cyanosis. No masses, redness, swelling, asymmetry, or associated skin lesions. No contractures.  Functional ROM: Unrestricted ROM                  Functional ROM: Unrestricted ROM                  Muscle Tone/Strength: Functionally intact. No obvious neuro-muscular anomalies detected.  Muscle Tone/Strength: Functionally intact. No obvious neuro-muscular anomalies detected.  Sensory (Neurological): Unimpaired  Sensory (Neurological): Unimpaired  Palpation: No palpable anomalies   Palpation: No palpable anomalies   Assessment  Primary Diagnosis & Pertinent Problem List: The primary encounter diagnosis was Fibromyalgia. Diagnoses of Chronic myofascial pain, DDD (degenerative disc disease), lumbar, and Chronic pain syndrome were also pertinent to this visit.  Status Diagnosis  Controlled Controlled Controlled 1. Fibromyalgia   2. Chronic myofascial pain   3. DDD (degenerative disc disease), lumbar   4. Chronic pain syndrome  Problems updated and reviewed during this visit: No problems updated. Plan of Care  Pharmacotherapy (Medications Ordered): Meds ordered this encounter  Medications  . HYDROcodone-acetaminophen (NORCO/VICODIN) 5-325 MG tablet    Sig: Take 1 tablet by mouth 3 (three) times daily as needed.    Dispense:  90 tablet    Refill:  0    Do not add this medication to the electronic "Automatic Refill" notification system. Patient may have prescription filled one day early if pharmacy is closed on scheduled refill date.    Order Specific Question:   Supervising Provider    Answer:   Milinda Pointer [phone]  . pregabalin (LYRICA) 25 MG capsule    Sig: Take 1 capsule (25 mg total) by mouth 3 (three) times daily.    Dispense:  90 capsule    Refill:  1    Order Specific Question:   Supervising Provider    Answer:   Milinda Pointer [phone]  . diclofenac sodium (VOLTAREN) 1 % GEL    Sig: Apply 4 g topically 4 (four) times daily.    Dispense:  100 g    Refill:  1    Do not add this medication to the electronic "Automatic Refill" notification system. Patient may have prescription filled one day early if pharmacy is closed on scheduled refill date.    Order Specific Question:   Supervising Provider    Answer:   Milinda Pointer [phone]   New Prescriptions   DICLOFENAC SODIUM (VOLTAREN) 1 % GEL    Apply 4 g topically 4 (four) times daily.   Medications administered today: Keniah Klemmer. Pinkett had no medications administered during this  visit. Lab-work, procedure(s), and/or referral(s): No orders of the defined types were placed in this encounter.  Imaging and/or referral(s): None  Interventional therapies: Planned, scheduled, and/or pending:   Not at this time.   Provider-requested follow-up: Return in about 6 weeks (around 03/11/2018) for MedMgmt.  Future Appointments  Date Time Provider [place]  03/11/2018  9:00 AM Vevelyn Francois, NP [facility] None   Primary Care Physician: Sharyne Peach, MD Location: [facility] Note by: Vevelyn Francois NP Date: 01/28/2018; Time: 9:58 AM  Pain Score Disclaimer: We use the NRS-11 scale. This is a self-reported, subjective measurement of pain severity with only modest accuracy. It is used primarily to identify changes within a particular patient. It must be understood that outpatient pain scales are significantly less accurate that those used for research, where they can be applied under ideal controlled circumstances with minimal exposure to variables. In reality, the score is likely to be a combination of pain intensity and pain affect, where pain affect describes the degree of emotional arousal or changes in action readiness caused by the sensory experience of pain. Factors such as social and work situation, setting, emotional state, anxiety levels, expectation, and prior pain experience may influence pain perception and show large inter-individual differences that may also be affected by time variables.  Patient instructions provided during this appointment: Patient Instructions  ____________________________________________________________________________________________  Medication Rules  Applies to: All patients receiving prescriptions (written or electronic).  Pharmacy of record: Pharmacy where electronic prescriptions will be sent. If written prescriptions are taken to a different pharmacy, please inform the nursing staff. The  pharmacy listed in the electronic medical record should be the one where you would like electronic prescriptions to be sent.  Prescription refills: Only during scheduled appointments. Applies to both, written and electronic prescriptions.  NOTE: The  following applies primarily to controlled substances (Opioid* Pain Medications).   Patient's responsibilities: 1. Pain Pills: Bring all pain pills to every appointment (except for procedure appointments). 2. Pill Bottles: Bring pills in original pharmacy bottle. Always bring newest bottle. Bring bottle, even if empty. 3. Medication refills: You are responsible for knowing and keeping track of what medications you need refilled. The day before your appointment, write a list of all prescriptions that need to be refilled. Bring that list to your appointment and give it to the admitting nurse. Prescriptions will be written only during appointments. If you forget a medication, it will not be "Called in", "Faxed", or "electronically sent". You will need to get another appointment to get these prescribed. 4. Prescription Accuracy: You are responsible for carefully inspecting your prescriptions before leaving our office. Have the discharge nurse carefully go over each prescription with you, before taking them home. Make sure that your name is accurately spelled, that your address is correct. Check the name and dose of your medication to make sure it is accurate. Check the number of pills, and the written instructions to make sure they are clear and accurate. Make sure that you are given enough medication to last until your next medication refill appointment. 5. Taking Medication: Take medication as prescribed. Never take more pills than instructed. Never take medication more frequently than prescribed. Taking less pills or less frequently is permitted and encouraged, when it comes to controlled substances (written prescriptions).  6. Inform other Doctors: Always  inform, all of your healthcare providers, of all the medications you take. 7. Pain Medication from other Providers: You are not allowed to accept any additional pain medication from any other Doctor or Healthcare provider. There are two exceptions to this rule. (see below) In the event that you require additional pain medication, you are responsible for notifying us, as stated below. 8. Medication Agreement: You are responsible for carefully reading and following our Medication Agreement. This must be signed before receiving any prescriptions from our practice. Safely store a copy of your signed Agreement. Violations to the Agreement will result in no further prescriptions. (Additional copies of our Medication Agreement are available upon request.) 9. Laws, Rules, & Regulations: All patients are expected to follow all Federal and [employer], [employer], Rules, [employer] [facility]. Ignorance of the Laws does not constitute a valid excuse. The use of any illegal substances is prohibited. 10. Adopted CDC guidelines & recommendations: Target dosing levels will be at or below 60 MME/day. Use of benzodiazepines** is not recommended.  Exceptions: There are only two exceptions to the rule of not receiving pain medications from other Healthcare Providers. 1. Exception #1 (Emergencies): In the event of an emergency (i.e.: accident requiring emergency care), you are allowed to receive additional pain medication. However, you are responsible for: As soon as you are able, call our office [phone], at any time of the day or night, and leave a message stating your name, the date and nature of the emergency, and the name and dose of the medication prescribed. In the event that your call is answered by a member of our staff, make sure to document and save the date, time, and the name of the person that took your information.  2. Exception #2 (Planned Surgery): In the event that you are scheduled by another doctor or dentist to  have any type of surgery or procedure, you are allowed (for a period no longer than 30 days), to receive additional pain medication, for  the acute post-op pain. However, in this case, you are responsible for picking up a copy of our "Post-op Pain Management for Surgeons" handout, and giving it to your surgeon or dentist. This document is available at our office, and does not require an appointment to obtain it. Simply go to our office during business hours (Monday-Thursday from 8:00 AM to 4:00 PM) (Friday 8:00 AM to 12:00 Noon) or if you have a scheduled appointment with [name], prior to your surgery, and ask for it by name. In addition, you will need to provide [name] with your name, name of your surgeon, type of surgery, and date of procedure or surgery.  *Opioid medications include: morphine, codeine, oxycodone, oxymorphone, hydrocodone, hydromorphone, meperidine, tramadol, tapentadol, buprenorphine, fentanyl, methadone. **Benzodiazepine medications include: diazepam (Valium), alprazolam (Xanax), clonazepam (Klonopine), lorazepam (Ativan), clorazepate (Tranxene), chlordiazepoxide (Librium), estazolam (Prosom), oxazepam (Serax), temazepam (Restoril), triazolam (Halcion) (Last updated: 05/22/2017) ____________________________________________________________________________________________   Hydrocodone - apap 5-325 mg x 1 month to fill on 03/19/18 escribed to your pharmacy   Diclofenac Gel escribed  Lyrica escribed

## 2018-01-28 ENCOUNTER — Other Ambulatory Visit: Payer: Self-pay | Admitting: Nurse Practitioner

## 2018-01-28 ENCOUNTER — Encounter: Payer: Self-pay | Admitting: Nurse Practitioner

## 2018-01-28 ENCOUNTER — Ambulatory Visit: Payer: Medicaid Other | Attending: Nurse Practitioner | Admitting: Nurse Practitioner

## 2018-01-28 ENCOUNTER — Telehealth: Payer: Self-pay

## 2018-01-28 VITALS — BP 145/89 | HR 100 | Temp 98.4°F | Resp 16 | Ht 59.0 in | Wt 124.0 lb

## 2018-01-28 DIAGNOSIS — Z888 Allergy status to other drugs, medicaments and biological substances status: Secondary | ICD-10-CM | POA: Insufficient documentation

## 2018-01-28 DIAGNOSIS — J449 Chronic obstructive pulmonary disease, unspecified: Secondary | ICD-10-CM | POA: Insufficient documentation

## 2018-01-28 DIAGNOSIS — Z79899 Other long term (current) drug therapy: Secondary | ICD-10-CM | POA: Diagnosis not present

## 2018-01-28 DIAGNOSIS — I700 Atherosclerosis of aorta: Secondary | ICD-10-CM | POA: Diagnosis not present

## 2018-01-28 DIAGNOSIS — F17210 Nicotine dependence, cigarettes, uncomplicated: Secondary | ICD-10-CM | POA: Diagnosis not present

## 2018-01-28 DIAGNOSIS — Z79891 Long term (current) use of opiate analgesic: Secondary | ICD-10-CM | POA: Diagnosis not present

## 2018-01-28 DIAGNOSIS — K219 Gastro-esophageal reflux disease without esophagitis: Secondary | ICD-10-CM | POA: Diagnosis not present

## 2018-01-28 DIAGNOSIS — M160 Bilateral primary osteoarthritis of hip: Secondary | ICD-10-CM | POA: Diagnosis not present

## 2018-01-28 DIAGNOSIS — K5792 Diverticulitis of intestine, part unspecified, without perforation or abscess without bleeding: Secondary | ICD-10-CM | POA: Diagnosis not present

## 2018-01-28 DIAGNOSIS — G894 Chronic pain syndrome: Secondary | ICD-10-CM | POA: Diagnosis not present

## 2018-01-28 DIAGNOSIS — Z794 Long term (current) use of insulin: Secondary | ICD-10-CM | POA: Insufficient documentation

## 2018-01-28 DIAGNOSIS — M5136 Other intervertebral disc degeneration, lumbar region: Secondary | ICD-10-CM

## 2018-01-28 DIAGNOSIS — Z5181 Encounter for therapeutic drug level monitoring: Secondary | ICD-10-CM | POA: Insufficient documentation

## 2018-01-28 DIAGNOSIS — H8110 Benign paroxysmal vertigo, unspecified ear: Secondary | ICD-10-CM | POA: Insufficient documentation

## 2018-01-28 DIAGNOSIS — Z882 Allergy status to sulfonamides status: Secondary | ICD-10-CM | POA: Diagnosis not present

## 2018-01-28 DIAGNOSIS — E119 Type 2 diabetes mellitus without complications: Secondary | ICD-10-CM | POA: Diagnosis not present

## 2018-01-28 DIAGNOSIS — E785 Hyperlipidemia, unspecified: Secondary | ICD-10-CM | POA: Diagnosis not present

## 2018-01-28 DIAGNOSIS — Z8249 Family history of ischemic heart disease and other diseases of the circulatory system: Secondary | ICD-10-CM | POA: Diagnosis not present

## 2018-01-28 DIAGNOSIS — I10 Essential (primary) hypertension: Secondary | ICD-10-CM | POA: Diagnosis not present

## 2018-01-28 DIAGNOSIS — M7918 Myalgia, other site: Secondary | ICD-10-CM

## 2018-01-28 DIAGNOSIS — K760 Fatty (change of) liver, not elsewhere classified: Secondary | ICD-10-CM | POA: Insufficient documentation

## 2018-01-28 DIAGNOSIS — F329 Major depressive disorder, single episode, unspecified: Secondary | ICD-10-CM | POA: Diagnosis not present

## 2018-01-28 DIAGNOSIS — M5116 Intervertebral disc disorders with radiculopathy, lumbar region: Secondary | ICD-10-CM | POA: Diagnosis not present

## 2018-01-28 DIAGNOSIS — E782 Mixed hyperlipidemia: Secondary | ICD-10-CM | POA: Diagnosis not present

## 2018-01-28 DIAGNOSIS — M797 Fibromyalgia: Secondary | ICD-10-CM | POA: Diagnosis not present

## 2018-01-28 DIAGNOSIS — G8929 Other chronic pain: Secondary | ICD-10-CM

## 2018-01-28 DIAGNOSIS — Z7982 Long term (current) use of aspirin: Secondary | ICD-10-CM | POA: Insufficient documentation

## 2018-01-28 MED ORDER — PREGABALIN 25 MG PO CAPS: 25.0000 mg | ORAL_CAPSULE | Freq: Three times a day (TID) | ORAL | 1 refills | Status: DC

## 2018-01-28 MED ORDER — HYDROCODONE-ACETAMINOPHEN 5-325 MG PO TABS: 1.0000 | ORAL_TABLET | Freq: Three times a day (TID) | ORAL | 0 refills | Status: DC | PRN

## 2018-01-28 MED ORDER — DICLOFENAC SODIUM 1 % TD GEL: 4.0000 g | Freq: Four times a day (QID) | TRANSDERMAL | 1 refills | Status: AC

## 2018-01-28 NOTE — Telephone Encounter (Signed)
Please advise 

## 2018-01-28 NOTE — Patient Instructions (Addendum)
____________________________________________________________________________________________  Medication Rules  Applies to: All patients receiving prescriptions (written or electronic).  Pharmacy of record: Pharmacy where electronic prescriptions will be sent. If written prescriptions are taken to a different pharmacy, please inform the nursing staff. The pharmacy listed in the electronic medical record should be the one where you would like electronic prescriptions to be sent.  Prescription refills: Only during scheduled appointments. Applies to both, written and electronic prescriptions.  NOTE: The following applies primarily to controlled substances (Opioid* Pain Medications).   Patient's responsibilities: 1. Pain Pills: Bring all pain pills to every appointment (except for procedure appointments). 2. Pill Bottles: Bring pills in original pharmacy bottle. Always bring newest bottle. Bring bottle, even if empty. 3. Medication refills: You are responsible for knowing and keeping track of what medications you need refilled. The day before your appointment, write a list of all prescriptions that need to be refilled. Bring that list to your appointment and give it to the admitting nurse. Prescriptions will be written only during appointments. If you forget a medication, it will not be "Called in", "Faxed", or "electronically sent". You will need to get another appointment to get these prescribed. 4. Prescription Accuracy: You are responsible for carefully inspecting your prescriptions before leaving our office. Have the discharge nurse carefully go over each prescription with you, before taking them home. Make sure that your name is accurately spelled, that your address is correct. Check the name and dose of your medication to make sure it is accurate. Check the number of pills, and the written instructions to make sure they are clear and accurate. Make sure that you are given enough medication to last  until your next medication refill appointment. 5. Taking Medication: Take medication as prescribed. Never take more pills than instructed. Never take medication more frequently than prescribed. Taking less pills or less frequently is permitted and encouraged, when it comes to controlled substances (written prescriptions).  6. Inform other Doctors: Always inform, all of your healthcare providers, of all the medications you take. 7. Pain Medication from other Providers: You are not allowed to accept any additional pain medication from any other Doctor or Healthcare provider. There are two exceptions to this rule. (see below) In the event that you require additional pain medication, you are responsible for notifying us, as stated below. 8. Medication Agreement: You are responsible for carefully reading and following our Medication Agreement. This must be signed before receiving any prescriptions from our practice. Safely store a copy of your signed Agreement. Violations to the Agreement will result in no further prescriptions. (Additional copies of our Medication Agreement are available upon request.) 9. Laws, Rules, & Regulations: All patients are expected to follow all Federal and State Laws, Statutes, Rules, & Regulations. Ignorance of the Laws does not constitute a valid excuse. The use of any illegal substances is prohibited. 10. Adopted CDC guidelines & recommendations: Target dosing levels will be at or below 60 MME/day. Use of benzodiazepines** is not recommended.  Exceptions: There are only two exceptions to the rule of not receiving pain medications from other Healthcare Providers. 1. Exception #1 (Emergencies): In the event of an emergency (i.e.: accident requiring emergency care), you are allowed to receive additional pain medication. However, you are responsible for: As soon as you are able, call our office [phone], at any time of the day or night, and leave a message stating your name, the  date and nature of the emergency, and the name and dose of the medication   prescribed. In the event that your call is answered by a member of our staff, make sure to document and save the date, time, and the name of the person that took your information.  2. Exception #2 (Planned Surgery): In the event that you are scheduled by another doctor or dentist to have any type of surgery or procedure, you are allowed (for a period no longer than 30 days), to receive additional pain medication, for the acute post-op pain. However, in this case, you are responsible for picking up a copy of our "Post-op Pain Management for Surgeons" handout, and giving it to your surgeon or dentist. This document is available at our office, and does not require an appointment to obtain it. Simply go to our office during business hours (Monday-Thursday from 8:00 AM to 4:00 PM) (Friday 8:00 AM to 12:00 Noon) or if you have a scheduled appointment with [name], prior to your surgery, and ask for it by name. In addition, you will need to provide [name] with your name, name of your surgeon, type of surgery, and date of procedure or surgery.  *Opioid medications include: morphine, codeine, oxycodone, oxymorphone, hydrocodone, hydromorphone, meperidine, tramadol, tapentadol, buprenorphine, fentanyl, methadone. **Benzodiazepine medications include: diazepam (Valium), alprazolam (Xanax), clonazepam (Klonopine), lorazepam (Ativan), clorazepate (Tranxene), chlordiazepoxide (Librium), estazolam (Prosom), oxazepam (Serax), temazepam (Restoril), triazolam (Halcion) (Last updated: 05/22/2017) ____________________________________________________________________________________________   Hydrocodone - apap 5-325 mg x 1 month to fill on 03/19/18 escribed to your pharmacy   Diclofenac Gel escribed  Lyrica escribed

## 2018-01-28 NOTE — Telephone Encounter (Signed)
Please call the pharmacy

## 2018-01-28 NOTE — Progress Notes (Signed)
Nursing Pain Medication Assessment:  Safety precautions to be maintained throughout the outpatient stay will include: orient to surroundings, keep bed in low position, maintain call bell within reach at all times, provide assistance with transfer out of bed and ambulation.  Medication Inspection Compliance: Pill count conducted under aseptic conditions, in front of the patient. Neither the pills nor the bottle was removed from the patient's sight at any time. Once count was completed pills were immediately returned to the patient in their original bottle.  Medication: Hydrocodone/APAP Pill/Patch Count: 69 of 90 pills remain Pill/Patch Appearance: Markings consistent with prescribed medication Bottle Appearance: Standard pharmacy container. Clearly labeled. Filled Date: 10 / 21 / 2019 Last Medication intake:  Today

## 2018-01-28 NOTE — Telephone Encounter (Signed)
Medication needs prior auth. It is available in generic form only. Brand name is on back order for months per pharmacist. I will call the patient and inform.     Victorino Dike

## 2018-01-28 NOTE — Telephone Encounter (Signed)
Pt called and stated that [name] on [address] said that the cream Crystal prescribed isn't available anymore and needs a different cream.

## 2018-02-05 ENCOUNTER — Other Ambulatory Visit: Payer: Self-pay | Admitting: Nurse Practitioner

## 2018-02-05 DIAGNOSIS — M7918 Myalgia, other site: Principal | ICD-10-CM

## 2018-02-05 DIAGNOSIS — G8929 Other chronic pain: Secondary | ICD-10-CM

## 2018-02-25 ENCOUNTER — Emergency Department
Admission: EM | Admit: 2018-02-25 | Discharge: 2018-02-25 | Disposition: A | Discharge status: Home / Self Care | Payer: Medicaid Other | Attending: Student in an Organized Health Care Education/Training Program | Admitting: Student in an Organized Health Care Education/Training Program

## 2018-02-25 ENCOUNTER — Other Ambulatory Visit: Payer: Self-pay

## 2018-02-25 DIAGNOSIS — M79641 Pain in right hand: Secondary | ICD-10-CM

## 2018-02-25 DIAGNOSIS — E119 Type 2 diabetes mellitus without complications: Secondary | ICD-10-CM | POA: Diagnosis not present

## 2018-02-25 DIAGNOSIS — F17210 Nicotine dependence, cigarettes, uncomplicated: Secondary | ICD-10-CM | POA: Insufficient documentation

## 2018-02-25 DIAGNOSIS — Z7982 Long term (current) use of aspirin: Secondary | ICD-10-CM | POA: Diagnosis not present

## 2018-02-25 DIAGNOSIS — I10 Essential (primary) hypertension: Secondary | ICD-10-CM | POA: Diagnosis not present

## 2018-02-25 DIAGNOSIS — Z79899 Other long term (current) drug therapy: Secondary | ICD-10-CM | POA: Insufficient documentation

## 2018-02-25 DIAGNOSIS — J45909 Unspecified asthma, uncomplicated: Secondary | ICD-10-CM | POA: Insufficient documentation

## 2018-02-25 MED ORDER — KETOROLAC TROMETHAMINE 10 MG PO TABS: 10.0000 mg | ORAL_TABLET | Freq: Four times a day (QID) | ORAL | 0 refills | Status: DC | PRN

## 2018-02-25 MED ORDER — KETOROLAC TROMETHAMINE 30 MG/ML IJ SOLN
30.0000 mg | Freq: Once | INTRAMUSCULAR | Status: AC
  Administered 2018-02-25: 30 mg via INTRAMUSCULAR
  Filled 2018-02-25: qty 1

## 2018-02-25 NOTE — ED Provider Notes (Signed)
[facility] Emergency Department Provider Note  ____________________________________________  Time seen: Approximately 12:12 PM  I have reviewed the triage vital signs and the nursing notes.   HISTORY  Chief Complaint Hand Pain    HPI [name] is a 57 y.o. female presents emergency department for evaluation of right hand pain and swelling for 1 day.  Hand is sore.  Pain is primarily over her volar hand.  Patient states that she has fibromyalgia and arthritis.  She sees pain management and has been taking a cream that they gave her and Percocet.  No trauma.  She has an appointment with primary care tomorrow.  No neck pain, shortness of breath, chest pain, arm pain, numbness, tingling..  Past Medical History:  Diagnosis Date  . Asthma   . Asthma   . BPPV (benign paroxysmal positional vertigo)   . Cardiac murmur, unspecified 08/28/2016  . Degenerative disc disease, lumbar   . Depression   . Diabetes mellitus   . Diverticulitis 10/2016  . Fatty liver   . Fibromyalgia   . Fibromyalgia   . GERD (gastroesophageal reflux disease)   . Headache   . History of degenerative disc disease   . Hyperlipidemia   . Hypertension     Patient Active Problem List   Diagnosis Date Noted  . Pharmacologic therapy 11/27/2017  . Disorder of skeletal system 11/27/2017  . Problems influencing health status 11/27/2017  . Aortic atherosclerosis (HCC) 11/26/2017  . Depression 09/01/2017  . Smoker 09/01/2017  . Lumbar radiculopathy 05/29/2017  . Chronic bilateral low back pain with bilateral sciatica 05/29/2017  . Chronic pain syndrome 05/29/2017  . Chronic, continuous use of opioids 05/29/2017  . Chronic myofascial pain 05/29/2017  . Primary osteoarthritis of both hips 03/11/2017  . Benign paroxysmal positional vertigo 11/08/2016  . Fatty liver disease, nonalcoholic 10/25/2016  . DDD (degenerative disc disease), lumbar 10/23/2016  . Diverticulitis 10/14/2016   . Cardiac murmur 08/28/2016  . Hyperlipidemia, mixed 05/29/2016  . Type 2 diabetes mellitus without complications (HCC) 05/29/2016  . COPD (chronic obstructive pulmonary disease) (HCC) 01/02/2016  . Essential hypertension 05/15/2015  . Fibromyalgia 04/19/2015  . Diabetes (HCC) 02/08/2015    Past Surgical History:  Procedure Laterality Date  . ABDOMINAL HYSTERECTOMY    . ABDOMINAL SURGERY    . COLONOSCOPY WITH PROPOFOL N/A 02/20/2017   Procedure: COLONOSCOPY WITH PROPOFOL;  Surgeon: Christena Deem, MD;  Location: [facility] ENDOSCOPY;  Service: Endoscopy;  Laterality: N/A;  . Diverticulitis    . ESOPHAGOGASTRODUODENOSCOPY (EGD) WITH PROPOFOL N/A 02/20/2017   Procedure: ESOPHAGOGASTRODUODENOSCOPY (EGD) WITH PROPOFOL;  Surgeon: Christena Deem, MD;  Location: [facility] ENDOSCOPY;  Service: Endoscopy;  Laterality: N/A;  . EUS N/A 01/08/2018   Procedure: UPPER ENDOSCOPIC ULTRASOUND (EUS) RADIAL;  Surgeon: Bearl Mulberry, MD;  Location: [facility] ENDOSCOPY;  Service: Gastroenterology;  Laterality: N/A;  . UPPER ESOPHAGEAL ENDOSCOPIC ULTRASOUND (EUS) N/A 04/03/2017   Procedure: UPPER ESOPHAGEAL ENDOSCOPIC ULTRASOUND (EUS);  Surgeon: Bearl Mulberry, MD;  Location: [facility] ENDOSCOPY;  Service: Gastroenterology;  Laterality: N/A;    Prior to Admission medications   Medication Sig Start Date End Date Taking? Authorizing Provider  ACCU-CHEK GUIDE test strip USE TID 12/30/17   [provider]  albuterol (PROVENTIL HFA;VENTOLIN HFA) 108 (90 Base) MCG/ACT inhaler Inhale 1 puff into the lungs 2 (two) times daily as needed.    [provider]  aspirin 81 MG tablet Take 81 mg by mouth daily.     [provider]  cetirizine (ZYRTEC) 10 MG chewable tablet Chew 10 mg by mouth daily.    [provider]  diclofenac sodium (VOLTAREN) 1 % GEL Apply 4 g topically 4 (four) times daily. 01/28/18 04/28/18  Barbette MerinoKing, Crystal M, NP  fluticasone (FLONASE) 50 MCG/ACT nasal spray Place 1  spray into both nostrils daily.    [provider]  HYDROcodone-acetaminophen (NORCO/VICODIN) 5-325 MG tablet Take 1 tablet by mouth 3 (three) times daily as needed. 02/17/18 03/19/18  Barbette MerinoKing, Crystal M, NP  hyoscyamine (LEVSIN, ANASPAZ) 0.125 MG tablet TK 1 T PO Q 6 H PRN FOR ADOMINAL PAIN 01/10/18   [provider]  ketorolac (TORADOL) 10 MG tablet Take 1 tablet (10 mg total) by mouth every 6 (six) hours as needed. 02/25/18   Enid DerryWagner, Irisa Grimsley, PA-C  LEVEMIR FLEXTOUCH 100 UNIT/ML Pen INJECT 24 UNITS [place] HS EVERY 4TH DAY INCREASE BY 4 UNITS UNTIL SUGARS ARE CONTROLLED 01/07/18   [provider]  linaclotide (LINZESS) 290 MCG CAPS capsule Take 290 mcg by mouth daily before breakfast.    [provider]  lisinopril (PRINIVIL,ZESTRIL) 10 MG tablet Take 10 mg by mouth daily.  04/19/15   [provider]  metFORMIN (GLUCOPHAGE) 1000 MG tablet Take 1 tablet by mouth 2 (two) times daily. 06/07/16   [provider]  mirtazapine (REMERON) 15 MG tablet Take 15 mg by mouth at bedtime.    [provider]  ondansetron (ZOFRAN) 4 MG tablet TK 1 T PO Q 8 H PRN N 12/30/17   [provider]  polyethylene glycol (MIRALAX / GLYCOLAX) packet Take 17 g by mouth daily.    [provider]  pregabalin (LYRICA) 25 MG capsule Take 1 capsule (25 mg total) by mouth 3 (three) times daily. 02/05/18 04/06/18  Barbette MerinoKing, Crystal M, NP  ranitidine (ZANTAC) 300 MG tablet Take 300 mg by mouth at bedtime.    [provider]  rizatriptan (MAXALT) 10 MG tablet Take 10 mg by mouth as needed for migraine. May repeat in 2 hours if needed    [provider]  rosuvastatin (CRESTOR) 20 MG tablet Take 20 mg by mouth daily.    [provider]  sucralfate (CARAFATE) 1 g tablet Take 1 g by mouth 4 (four) times daily -  with meals and at bedtime.    [provider]  topiramate (TOPAMAX) 25 MG tablet Take 25 mg by mouth 2 (two) times daily.     [provider]  traZODone (DESYREL) 100 MG tablet Take 1 tablet by mouth at bedtime as needed.    [provider]    Allergies Tramadol; Benadryl [diphenhydramine hcl]; Bextra [valdecoxib]; Invokana [canagliflozin]; and Sulfamethoxazole-trimethoprim  Family History  Problem Relation Age of Onset  . Diabetes Mother   . Hypertension Mother     Social History Social History   Tobacco Use  . Smoking status: Current Every Day Smoker    Packs/day: 0.50    Types: Cigarettes  . Smokeless tobacco: Current User  . Tobacco comment: currently decreasing tobacco use  Substance Use Topics  . Alcohol use: No  . Drug use: No    Comment: 4 years ago used Crack     Review of Systems  Constitutional: No fever/chills Cardiovascular: No chest pain. Respiratory:  No SOB. Gastrointestinal: No nausea, no vomiting.  Musculoskeletal: Positive for hand pain.  Skin: Negative for rash, abrasions, lacerations, ecchymosis. Neurological: Negative for numbness or tingling   ____________________________________________   PHYSICAL EXAM:  VITAL SIGNS: ED Triage Vitals  Enc Vitals Group     BP 02/25/18 1112 139/77     Pulse Rate 02/25/18 1112 91     Resp 02/25/18 1112 16     Temp 02/25/18 1112 98.1 F (36.7 C)     Temp Source 02/25/18 1112 Oral     SpO2 02/25/18 1112 96 %     Weight 02/25/18 1113 124 lb (56.2 kg)     Height 02/25/18 1113 4\' 11"  (1.499 m)     Head Circumference --      Peak Flow --      Pain Score 02/25/18 1112 10     Pain Loc --      Pain Edu? --      Excl. in GC? --      Constitutional: Alert and oriented. Well appearing and in no acute distress. Eyes: Conjunctivae are normal. PERRL. EOMI. Head: Atraumatic. ENT:      Ears:      Nose: No congestion/rhinnorhea.      Mouth/Throat: Mucous membranes are moist.  Neck: No stridor.  Cardiovascular: Normal rate, regular rhythm.  Good peripheral circulation.  Symmetric radial pulses  bilaterally. Respiratory: Normal respiratory effort without tachypnea or retractions. Lungs CTAB. Good air entry to the bases with no decreased or absent breath sounds. Musculoskeletal: Full range of motion to all extremities. No gross deformities appreciated.  Grip strength intact.  No pinpoint tenderness to palpation to right hand.  No arm swelling. Mild swelling to right hand. Neurologic:  Normal speech and language. No gross focal neurologic deficits are appreciated.  Skin:  Skin is warm, dry and intact. No rash noted. Psychiatric: Mood and affect are normal. Speech and behavior are normal. Patient exhibits appropriate insight and judgement.   ____________________________________________   LABS (all labs ordered are listed, but only abnormal results are displayed)  Labs Reviewed - No data to display ____________________________________________  EKG   ____________________________________________  RADIOLOGY Patient  No results found.  ____________________________________________    PROCEDURES  Procedure(s) performed:    Procedures    Medications  ketorolac (TORADOL) 30 MG/ML injection 30 mg (30 mg Intramuscular Given 02/25/18 1236)     ____________________________________________   INITIAL IMPRESSION / ASSESSMENT AND PLAN / ED COURSE  Pertinent labs & imaging results that were available during my care of the patient were reviewed by me and considered in my medical decision making (see chart for details).  Review of the [place] CSRS was performed in accordance of the [facility] prior to dispensing any controlled drugs.   Patient presented to emergency department for evaluation of right hand pain and swelling.  Vital signs and exam are reassuring.  Patient declines any imaging at this time.  She has an appointment tomorrow with primary care.  She is given a shot of Toradol in the ED.  Patient will be discharged home with prescriptions for Toradol.  Patient is to follow up  with primary care as directed. Patient is given ED precautions to return to the ED for any worsening or new symptoms.     ____________________________________________  FINAL CLINICAL IMPRESSION(S) / ED DIAGNOSES  Final diagnoses:  Right hand pain      NEW MEDICATIONS STARTED DURING THIS VISIT:  ED Discharge Orders         Ordered    ketorolac (TORADOL) 10 MG tablet  Every 6 hours PRN     02/25/18 1305              This chart was dictated using voice recognition  software/Dragon. Despite best efforts to proofread, errors can occur which can change the meaning. Any change was purely unintentional.    Enid Derry, PA-C 02/25/18 1528    Willy Eddy, MD 02/26/18 [phone]

## 2018-02-25 NOTE — Discharge Instructions (Signed)
Please follow-up with your appointment with [facility] primary care tomorrow for recheck.

## 2018-02-25 NOTE — ED Notes (Addendum)
FIRST NURSE NOTE:  Pt c/o right arm and right hand pain with swelling.

## 2018-02-25 NOTE — ED Triage Notes (Signed)
Pt in via POV, reports sudden onset right hand pain.  Pt states, "I have fibromyalgia, and arthritis, I go to the pain clinic, I have been using the cream they gave me."  Pt denies any relief w/ prescription pain medications, denies any recent injury.  Vitals WDL, NAD noted at this time.

## 2018-03-04 DIAGNOSIS — R102 Pelvic and perineal pain: Secondary | ICD-10-CM | POA: Insufficient documentation

## 2018-03-04 DIAGNOSIS — K8020 Calculus of gallbladder without cholecystitis without obstruction: Secondary | ICD-10-CM | POA: Insufficient documentation

## 2018-03-04 DIAGNOSIS — K5909 Other constipation: Secondary | ICD-10-CM | POA: Insufficient documentation

## 2018-03-11 ENCOUNTER — Ambulatory Visit: Payer: Medicaid Other | Attending: Nurse Practitioner | Admitting: Nurse Practitioner

## 2018-03-11 ENCOUNTER — Encounter: Payer: Self-pay | Admitting: Nurse Practitioner

## 2018-03-11 VITALS — BP 126/80 | HR 100 | Temp 98.0°F | Resp 16 | Ht 59.0 in | Wt 124.0 lb

## 2018-03-11 DIAGNOSIS — Z885 Allergy status to narcotic agent status: Secondary | ICD-10-CM | POA: Insufficient documentation

## 2018-03-11 DIAGNOSIS — R102 Pelvic and perineal pain: Secondary | ICD-10-CM | POA: Diagnosis not present

## 2018-03-11 DIAGNOSIS — M160 Bilateral primary osteoarthritis of hip: Secondary | ICD-10-CM | POA: Insufficient documentation

## 2018-03-11 DIAGNOSIS — K5909 Other constipation: Secondary | ICD-10-CM | POA: Diagnosis not present

## 2018-03-11 DIAGNOSIS — K219 Gastro-esophageal reflux disease without esophagitis: Secondary | ICD-10-CM | POA: Insufficient documentation

## 2018-03-11 DIAGNOSIS — M7918 Myalgia, other site: Secondary | ICD-10-CM

## 2018-03-11 DIAGNOSIS — Z79891 Long term (current) use of opiate analgesic: Secondary | ICD-10-CM | POA: Diagnosis not present

## 2018-03-11 DIAGNOSIS — F329 Major depressive disorder, single episode, unspecified: Secondary | ICD-10-CM | POA: Diagnosis not present

## 2018-03-11 DIAGNOSIS — K8020 Calculus of gallbladder without cholecystitis without obstruction: Secondary | ICD-10-CM | POA: Diagnosis not present

## 2018-03-11 DIAGNOSIS — I10 Essential (primary) hypertension: Secondary | ICD-10-CM | POA: Diagnosis not present

## 2018-03-11 DIAGNOSIS — Z5181 Encounter for therapeutic drug level monitoring: Secondary | ICD-10-CM | POA: Diagnosis present

## 2018-03-11 DIAGNOSIS — E782 Mixed hyperlipidemia: Secondary | ICD-10-CM | POA: Diagnosis not present

## 2018-03-11 DIAGNOSIS — F17210 Nicotine dependence, cigarettes, uncomplicated: Secondary | ICD-10-CM | POA: Diagnosis not present

## 2018-03-11 DIAGNOSIS — M5416 Radiculopathy, lumbar region: Secondary | ICD-10-CM

## 2018-03-11 DIAGNOSIS — J449 Chronic obstructive pulmonary disease, unspecified: Secondary | ICD-10-CM | POA: Diagnosis not present

## 2018-03-11 DIAGNOSIS — M797 Fibromyalgia: Secondary | ICD-10-CM | POA: Diagnosis not present

## 2018-03-11 DIAGNOSIS — M5116 Intervertebral disc disorders with radiculopathy, lumbar region: Secondary | ICD-10-CM | POA: Diagnosis not present

## 2018-03-11 DIAGNOSIS — M79642 Pain in left hand: Secondary | ICD-10-CM | POA: Insufficient documentation

## 2018-03-11 DIAGNOSIS — Z7982 Long term (current) use of aspirin: Secondary | ICD-10-CM | POA: Diagnosis not present

## 2018-03-11 DIAGNOSIS — G894 Chronic pain syndrome: Secondary | ICD-10-CM | POA: Diagnosis not present

## 2018-03-11 DIAGNOSIS — E119 Type 2 diabetes mellitus without complications: Secondary | ICD-10-CM | POA: Insufficient documentation

## 2018-03-11 DIAGNOSIS — G8929 Other chronic pain: Secondary | ICD-10-CM

## 2018-03-11 DIAGNOSIS — Z888 Allergy status to other drugs, medicaments and biological substances status: Secondary | ICD-10-CM | POA: Insufficient documentation

## 2018-03-11 DIAGNOSIS — H8110 Benign paroxysmal vertigo, unspecified ear: Secondary | ICD-10-CM | POA: Insufficient documentation

## 2018-03-11 DIAGNOSIS — Z881 Allergy status to other antibiotic agents status: Secondary | ICD-10-CM | POA: Insufficient documentation

## 2018-03-11 DIAGNOSIS — M79641 Pain in right hand: Secondary | ICD-10-CM | POA: Diagnosis not present

## 2018-03-11 DIAGNOSIS — Z794 Long term (current) use of insulin: Secondary | ICD-10-CM | POA: Diagnosis not present

## 2018-03-11 DIAGNOSIS — Z791 Long term (current) use of non-steroidal anti-inflammatories (NSAID): Secondary | ICD-10-CM | POA: Diagnosis not present

## 2018-03-11 DIAGNOSIS — I700 Atherosclerosis of aorta: Secondary | ICD-10-CM | POA: Insufficient documentation

## 2018-03-11 DIAGNOSIS — Z79899 Other long term (current) drug therapy: Secondary | ICD-10-CM | POA: Diagnosis not present

## 2018-03-11 MED ORDER — PREGABALIN 25 MG PO CAPS: 25.0000 mg | ORAL_CAPSULE | Freq: Three times a day (TID) | ORAL | 1 refills | Status: DC

## 2018-03-11 MED ORDER — NONFORMULARY OR COMPOUNDED ITEM: 2 refills | Status: DC

## 2018-03-11 MED ORDER — HYDROCODONE-ACETAMINOPHEN 5-325 MG PO TABS: 1.0000 | ORAL_TABLET | Freq: Three times a day (TID) | ORAL | 0 refills | Status: DC | PRN

## 2018-03-11 NOTE — Progress Notes (Signed)
Patient's Name: [name]  MRN: [id number]  Referring Provider: Sharyne Peach, MD  DOB: Apr 21, 1960  PCP: Sharyne Peach, MD  DOS: 03/11/2018  Note by: Vevelyn Francois NP  Service setting: Ambulatory outpatient  Specialty: Interventional Pain Management  Location: [facility] (AMB) Pain Management Facility    Patient type: Established    Primary Reason(s) for Visit: Encounter for prescription drug management. (Level of risk: moderate)  CC: Back Pain (lower bilateral); Leg Pain (bilateral ); and Hand Pain (right )  HPI  [name] is a 57 y.o. year old, female patient, who comes today for a medication management evaluation. She has Essential hypertension; Diabetes ([place]); Fibromyalgia; COPD (chronic obstructive pulmonary disease) ([place]); Diverticulitis; Lumbar radiculopathy; Chronic bilateral low back pain with bilateral sciatica; Chronic pain syndrome; Chronic, continuous use of opioids; Chronic myofascial pain; Benign paroxysmal positional vertigo; Cardiac murmur; DDD (degenerative disc disease), lumbar; Depression; Fatty liver disease, nonalcoholic; Primary osteoarthritis of both hips; Hyperlipidemia, mixed; Smoker; Type 2 diabetes mellitus without complications ([place]); Aortic atherosclerosis ([place]); Pharmacologic therapy; Disorder of skeletal system; Problems influencing health status; Calculus of gallbladder without cholecystitis without obstruction; Chronic constipation; Pelvic pain in female; and Pain in both hands on their problem list. Her primarily concern today is the Back Pain (lower bilateral); Leg Pain (bilateral ); and Hand Pain (right )  Pain Assessment: Location: Lower, Left, Right(see visit info for pain sites. ) Back Radiating: back pain into legs  Onset: More than a month ago Duration: Chronic pain Quality: Discomfort, Aching, Constant Severity: 10-Worst pain ever/10 (subjective, self-reported pain score)  Note: Reported level is compatible with observation. Clinically  the patient looks like a 1/10 A 1/10 is viewed as "Mild" and described as nagging, annoying, but not interfering with basic activities of daily living (ADL). [name] is able to eat, bathe, get dressed, do toileting (being able to get on and off the toilet and perform personal hygiene functions), transfer (move in and out of bed or a chair without assistance), and maintain continence (able to control bladder and bowel functions). Physiologic parameters such as blood pressure and heart rate apear wnl. Information on the proper use of the pain scale provided to the patient today. When using our objective Pain Scale, levels between 6 and 10/10 are said to belong in an emergency room, as it progressively worsens from a 6/10, described as severely limiting, requiring emergency care not usually available at an outpatient pain management facility. At a 6/10 level, communication becomes difficult and requires great effort. Assistance to reach the emergency department may be required. Facial flushing and profuse sweating along with potentially dangerous increases in heart rate and blood pressure will be evident. Effect on ADL: some days she has to just lay around and rest.  if she keeps her routine the same her function is better.  Timing: Constant Modifying factors: pain medications, ketoralac that was given at ED  BP: 126/80  HR: 100  [name] was last scheduled for an appointment on 02/05/2018 for medication management. During today's appointment we reviewed [name]'s chronic pain status, as well as her outpatient medication regimen. She admits that she was diagnosed with tendonitis. She admits that it was worse on the right. She was seen in the ER given Ketodolac, which she feels is very effective. She was also given an brace by her PCP however this is too big so it is not effective. She admits that the Diclofenac was not effective at all for the flare up.  The patient  reports no history  of drug use. Her body mass index is 25.04 kg/m.  Further details on both, my assessment(s), as well as the proposed treatment plan, please see below.  Controlled Substance Pharmacotherapy Assessment REMS (Risk Evaluation and Mitigation Strategy)  Analgesic:Hydrocodone 5 mg TID prn MME/day:79m/day PJanett Billow RN  03/11/2018  9:20 AM  Sign when Signing Visit Nursing Pain Medication Assessment:  Safety precautions to be maintained throughout the outpatient stay will include: orient to surroundings, keep bed in low position, maintain call bell within reach at all times, provide assistance with transfer out of bed and ambulation.  Medication Inspection Compliance: Pill count conducted under aseptic conditions, in front of the patient. Neither the pills nor the bottle was removed from the patient's sight at any time. Once count was completed pills were immediately returned to the patient in their original bottle.  Medication: Hydrocodone/APAP Pill/Patch Count: 33 of 90 pills remain Pill/Patch Appearance: Markings consistent with prescribed medication Bottle Appearance: Standard pharmacy container. Clearly labeled. Filled Date: 156/ 26 / 2019 Last Medication intake:  Today   Patient had to go to ED in November d/t extreme hand pain on the right.  She was given Toradol 10 mg tablets  Sig 1 po every 6 hours prn for pain.    Pharmacokinetics: Liberation and absorption (onset of action): WNL Distribution (time to peak effect): WNL Metabolism and excretion (duration of action): WNL         Pharmacodynamics: Desired effects: Analgesia: Ms. CGolzreports >50% benefit. Functional ability: Patient reports that medication allows her to accomplish basic ADLs Clinically meaningful improvement in function (CMIF): Sustained CMIF goals met Perceived effectiveness: Described as relatively effective, allowing for increase in activities of daily living (ADL) Undesirable  effects: Side-effects or Adverse reactions: None reported Monitoring: [place] PMP: Online review of the past 145-montheriod conducted. Compliant with practice rules and regulations Last UDS on record: Summary  Date Value Ref Range Status  11/27/2017 FINAL  Final    Comment:    ==================================================================== TOXASSURE SELECT 13 (MW) ==================================================================== Test                             Result       Flag       Units Drug Present and Declared for Prescription Verification   Hydrocodone                    291          EXPECTED   ng/mg creat   Hydromorphone                  146          EXPECTED   ng/mg creat   Dihydrocodeine                 141          EXPECTED   ng/mg creat   Norhydrocodone                 833          EXPECTED   ng/mg creat    Sources of hydrocodone include scheduled prescription    medications. Hydromorphone, dihydrocodeine and norhydrocodone are    expected metabolites of hydrocodone. Hydromorphone and    dihydrocodeine are also available as scheduled prescription    medications. ==================================================================== Test  Result    Flag   Units      Ref Range   Creatinine              78               mg/dL      >=20 ==================================================================== Declared Medications:  The flagging and interpretation on this report are based on the  following declared medications.  Unexpected results may arise from  inaccuracies in the declared medications.  **Note: The testing scope of this panel includes these medications:  Hydrocodone (Hydrocodone-Acetaminophen)  **Note: The testing scope of this panel does not include following  reported medications:  Acetaminophen (Hydrocodone-Acetaminophen)  Albuterol  Aspirin (Aspirin 81)  Cetirizine  Cyclobenzaprine  Dicyclomine  Fluticasone  Linaclotide   Lisinopril  Metformin  Mirtazapine  Pantoprazole  Polyethylene Glycol  Pregabalin  Ranitidine  Rizatriptan (Maxalt)  Rosuvastatin (Crestor)  Sucralfate (Carafate)  Topiramate (Topamax)  Trazodone (Desyrel) ==================================================================== For clinical consultation, please call [phone]. ====================================================================    UDS interpretation: Compliant          Medication Assessment Form: Reviewed. Patient indicates being compliant with therapy Treatment compliance: Compliant Risk Assessment Profile: Aberrant behavior: See prior evaluations. None observed or detected today Comorbid factors increasing risk of overdose: See prior notes. No additional risks detected today Opioid risk tool (ORT) (Total Score):   Risk of substance use disorder (SUD): High  ORT Scoring interpretation table:  Score <3 = Low Risk for SUD  Score between 4-7 = Moderate Risk for SUD  Score >8 = High Risk for Opioid Abuse   Risk Mitigation Strategies:  Patient Counseling: Covered Patient-Prescriber Agreement (PPA): Present and active  Notification to other healthcare providers: Done  Pharmacologic Plan: No change in therapy, at this time.             Laboratory Chemistry  Inflammation Markers (CRP: Acute Phase) (ESR: Chronic Phase) Lab Results  Component Value Date   CRP <1 11/27/2017   ESRSEDRATE 17 11/27/2017   LATICACIDVEN 1.6 10/14/2016                         Rheumatology Markers No results found for: RF, ANA, LABURIC, URICUR, LYMEIGGIGMAB, LYMEABIGMQN, HLAB27                      Renal Function Markers Lab Results  Component Value Date   BUN 10 12/07/2017   CREATININE 0.74 12/07/2017   BCR 14 11/27/2017   GFRAA >60 12/07/2017   GFRNONAA >60 12/07/2017                             Hepatic Function Markers Lab Results  Component Value Date   AST 22 12/07/2017   ALT 28 12/07/2017   ALBUMIN 4.0  12/07/2017   ALKPHOS 74 12/07/2017   LIPASE 35 12/07/2017                        Electrolytes Lab Results  Component Value Date   NA 143 12/07/2017   K 3.7 12/07/2017   CL 112 (H) 12/07/2017   CALCIUM 9.4 12/07/2017   MG 1.8 11/27/2017                        Neuropathy Markers Lab Results  Component Value Date   HGBA1C 10.4 (H) 05/02/2016   HIV  Non Reactive 10/15/2016                        CNS Tests No results found for: COLORCSF, APPEARCSF, RBCCOUNTCSF, WBCCSF, POLYSCSF, LYMPHSCSF, EOSCSF, PROTEINCSF, GLUCCSF, JCVIRUS, CSFOLI, IGGCSF                      Bone Pathology Markers No results found for: VD25OH, UX324MW1UUV, OZ3664QI3, KV4259DG3, 25OHVITD1, 25OHVITD2, 25OHVITD3, TESTOFREE, TESTOSTERONE                       Coagulation Parameters Lab Results  Component Value Date   PLT 255 12/07/2017                        Cardiovascular Markers Lab Results  Component Value Date   TROPONINI <0.03 10/16/2016   HGB 12.7 12/07/2017   HCT 38.3 12/07/2017                         CA Markers No results found for: CEA, CA125, LABCA2                      Note: Lab results reviewed.  Recent Diagnostic Imaging Results  CT Renal Stone Study CLINICAL DATA:  Abdominal pain left-sided 1 month.  EXAM: CT ABDOMEN AND PELVIS WITHOUT CONTRAST  TECHNIQUE: Multidetector CT imaging of the abdomen and pelvis was performed following the standard protocol without IV contrast.  COMPARISON:  06/26/2017 and 10/14/2016  FINDINGS: Lower chest: Lung bases are clear.  Hepatobiliary: Diffuse low-attenuation of the liver without focal mass. Biliary tree is within normal. Mild cholelithiasis.  Pancreas: Normal.  Spleen: Normal.  Adrenals/Urinary Tract: Adrenal glands are normal. Kidneys are normal in size without hydronephrosis or nephrolithiasis. Ureters and bladder are normal.  Stomach/Bowel: Stomach and small bowel are normal. Appendix is normal. Colon is  unremarkable.  Vascular/Lymphatic: Mild calcified plaque over the abdominal aorta. No adenopathy.  Reproductive: Previous hysterectomy.  Adnexal regions unremarkable.  Other: No free fluid or focal inflammatory change.  Musculoskeletal: Unremarkable.  IMPRESSION: No acute findings in the abdomen/pelvis.  Cholelithiasis.  Diffuse low-attenuation of the liver likely steatosis.  Aortic Atherosclerosis (ICD10-I70.0).  Electronically Signed   By: Marin Olp M.D.   On: 12/07/2017 14:48  Complexity Note: Imaging results reviewed. Results shared with [name], using Layman's terms.                         Meds   Current Outpatient Medications:  .  ACCU-CHEK GUIDE test strip, USE TID, Disp: , Rfl: 12 .  albuterol (PROVENTIL HFA;VENTOLIN HFA) 108 (90 Base) MCG/ACT inhaler, Inhale 1 puff into the lungs 2 (two) times daily as needed., Disp: , Rfl:  .  aspirin 81 MG tablet, Take 81 mg by mouth daily. , Disp: , Rfl:  .  cetirizine (ZYRTEC) 10 MG chewable tablet, Chew 10 mg by mouth daily., Disp: , Rfl:  .  diclofenac sodium (VOLTAREN) 1 % GEL, Apply 4 g topically 4 (four) times daily., Disp: 100 g, Rfl: 1 .  fluticasone (FLONASE) 50 MCG/ACT nasal spray, Place 1 spray into both nostrils daily., Disp: , Rfl:  .  [START ON 03/19/2018] HYDROcodone-acetaminophen (NORCO/VICODIN) 5-325 MG tablet, Take 1 tablet by mouth 3 (three) times daily as needed., Disp: 90 tablet, Rfl: 0 .  hyoscyamine (LEVSIN, ANASPAZ) 0.125 MG tablet, TK  1 T PO Q 6 H PRN FOR ADOMINAL PAIN, Disp: , Rfl: 5 .  ketorolac (TORADOL) 10 MG tablet, Take 1 tablet (10 mg total) by mouth every 6 (six) hours as needed., Disp: 20 tablet, Rfl: 0 .  LEVEMIR FLEXTOUCH 100 UNIT/ML Pen, INJECT 24 UNITS [place] [facility] EVERY 4TH DAY INCREASE BY 4 UNITS UNTIL SUGARS ARE CONTROLLED, Disp: , Rfl: 11 .  linaclotide (LINZESS) 290 MCG CAPS capsule, Take 290 mcg by mouth daily before breakfast., Disp: , Rfl:  .  lisinopril (PRINIVIL,ZESTRIL) 10 MG  tablet, Take 10 mg by mouth daily. , Disp: , Rfl:  .  metFORMIN (GLUCOPHAGE) 1000 MG tablet, Take 1 tablet by mouth 2 (two) times daily., Disp: , Rfl:  .  mirtazapine (REMERON) 15 MG tablet, Take 15 mg by mouth at bedtime., Disp: , Rfl:  .  ondansetron (ZOFRAN) 4 MG tablet, TK 1 T PO Q 8 H PRN N, Disp: , Rfl: 0 .  polyethylene glycol (MIRALAX / GLYCOLAX) packet, Take 17 g by mouth daily., Disp: , Rfl:  .  pregabalin (LYRICA) 25 MG capsule, Take 1 capsule (25 mg total) by mouth 3 (three) times daily., Disp: 90 capsule, Rfl: 1 .  ranitidine (ZANTAC) 300 MG tablet, Take 300 mg by mouth at bedtime., Disp: , Rfl:  .  rizatriptan (MAXALT) 10 MG tablet, Take 10 mg by mouth as needed for migraine. May repeat in 2 hours if needed, Disp: , Rfl:  .  rosuvastatin (CRESTOR) 20 MG tablet, Take 20 mg by mouth daily., Disp: , Rfl:  .  sucralfate (CARAFATE) 1 g tablet, Take 1 g by mouth 4 (four) times daily -  with meals and at bedtime., Disp: , Rfl:  .  topiramate (TOPAMAX) 25 MG tablet, Take 25 mg by mouth 2 (two) times daily., Disp: , Rfl:  .  traZODone (DESYREL) 100 MG tablet, Take 1 tablet by mouth at bedtime as needed., Disp: , Rfl:  .  NONFORMULARY OR COMPOUNDED ITEM, Sig: Apply 1-2 gm(s) (2-4 pumps) to affected area, 3-4 times/day. (1 pump = 0.5 gm), Disp: 1 each, Rfl: 2  ROS  Constitutional: Denies any fever or chills Gastrointestinal: No reported hemesis, hematochezia, vomiting, or acute GI distress Musculoskeletal: Denies any acute onset joint swelling, redness, loss of ROM, or weakness Neurological: No reported episodes of acute onset apraxia, aphasia, dysarthria, agnosia, amnesia, paralysis, loss of coordination, or loss of consciousness  Allergies  [name] is allergic to tramadol; benadryl [diphenhydramine hcl]; bextra [valdecoxib]; invokana [canagliflozin]; and sulfamethoxazole-trimethoprim.  PFSH  Drug: [name]  reports no history of drug use. Alcohol:  reports no history of  alcohol use. Tobacco:  reports that she has been smoking cigarettes. She has been smoking about 0.50 packs per day. She uses smokeless tobacco. Medical:  has a past medical history of Asthma, Asthma, BPPV (benign paroxysmal positional vertigo), Cardiac murmur, unspecified (08/28/2016), Degenerative disc disease, lumbar, Depression, Diabetes mellitus, Diverticulitis (10/2016), Fatty liver, Fibromyalgia, Fibromyalgia, GERD (gastroesophageal reflux disease), Headache, History of degenerative disc disease, Hyperlipidemia, and Hypertension. Surgical: [name]  has a past surgical history that includes Abdominal surgery; Abdominal hysterectomy; Esophagogastroduodenoscopy (egd) with propofol (N/A, 02/20/2017); Colonoscopy with propofol (N/A, 02/20/2017); Upper esophageal endoscopic ultrasound (eus) (N/A, 04/03/2017); Diverticulitis; and EUS (N/A, 01/08/2018). Family: family history includes Diabetes in her mother; Hypertension in her mother.  Constitutional Exam  General appearance: Well nourished, well developed, and well hydrated. In no apparent acute distress Vitals:   03/11/18 0921  BP: 126/80  Pulse: 100  Resp: 16  Temp: 98 F (36.7 C)  TempSrc: Oral  SpO2: 95%  Weight: 124 lb (56.2 kg)  Height: '4\' 11"'  (1.499 m)  Psych/Mental status: Alert, oriented x 3 (person, place, & time)       Eyes: PERLA Respiratory: No evidence of acute respiratory distress  Cervical Spine Area Exam  Skin & Axial Inspection: No masses, redness, edema, swelling, or associated skin lesions Alignment: Symmetrical Functional ROM: Unrestricted ROM      Stability: No instability detected Muscle Tone/Strength: Functionally intact. No obvious neuro-muscular anomalies detected. Sensory (Neurological): Unimpaired Palpation: No palpable anomalies              Upper Extremity (UE) Exam    Side: Right upper extremity  Side: Left upper extremity  Skin & Extremity Inspection: Edema  Skin & Extremity Inspection:  [place]'s nodes (DIP)  Functional ROM: Guarding          Functional ROM: Decreased ROM          Muscle Tone/Strength: Functionally intact. No obvious neuro-muscular anomalies detected.  Muscle Tone/Strength: Functionally intact. No obvious neuro-muscular anomalies detected.  Sensory (Neurological): Unimpaired          Sensory (Neurological): Unimpaired          Palpation: Tender              Palpation: No palpable anomalies                   Thoracic Spine Area Exam  Skin & Axial Inspection: No masses, redness, or swelling Alignment: Symmetrical Functional ROM: Unrestricted ROM Stability: No instability detected Muscle Tone/Strength: Functionally intact. No obvious neuro-muscular anomalies detected. Sensory (Neurological): Unimpaired Muscle strength & Tone: No palpable anomalies  Lumbar Spine Area Exam  Skin & Axial Inspection: No masses, redness, or swelling Alignment: Symmetrical Functional ROM: Unrestricted ROM       Stability: No instability detected Muscle Tone/Strength: Functionally intact. No obvious neuro-muscular anomalies detected. Sensory (Neurological): Unimpaired Palpation: No palpable anomalies        Gait & Posture Assessment  Ambulation: Unassisted Gait: Relatively normal for age and body habitus Posture: WNL   Lower Extremity Exam    Side: Right lower extremity  Side: Left lower extremity  Stability: No instability observed          Stability: No instability observed          Skin & Extremity Inspection: Skin color, temperature, and hair growth are WNL. No peripheral edema or cyanosis. No masses, redness, swelling, asymmetry, or associated skin lesions. No contractures.  Skin & Extremity Inspection: Skin color, temperature, and hair growth are WNL. No peripheral edema or cyanosis. No masses, redness, swelling, asymmetry, or associated skin lesions. No contractures.  Functional ROM: Unrestricted ROM                  Functional ROM: Unrestricted ROM                   Muscle Tone/Strength: Functionally intact. No obvious neuro-muscular anomalies detected.  Muscle Tone/Strength: Functionally intact. No obvious neuro-muscular anomalies detected.  Sensory (Neurological): Unimpaired        Sensory (Neurological): Unimpaired            Palpation: No palpable anomalies  Palpation: No palpable anomalies   Assessment  Primary Diagnosis & Pertinent Problem List: The primary encounter diagnosis was Lumbar radiculopathy. Diagnoses of Pain in both hands, Chronic myofascial pain, Chronic pain syndrome, and Long term prescription opiate use  were also pertinent to this visit.  Status Diagnosis  Controlled Worsening Controlled 1. Lumbar radiculopathy   2. Pain in both hands   3. Chronic myofascial pain   4. Chronic pain syndrome   5. Long term prescription opiate use     Problems updated and reviewed during this visit: Problem  Pain in Both Hands  Calculus of Gallbladder Without Cholecystitis Without Obstruction  Chronic Constipation   Followed by GI   Pelvic Pain in Female   Plan of Care  Pharmacotherapy (Medications Ordered): Meds ordered this encounter  Medications  . HYDROcodone-acetaminophen (NORCO/VICODIN) 5-325 MG tablet    Sig: Take 1 tablet by mouth 3 (three) times daily as needed.    Dispense:  90 tablet    Refill:  0    Do not add this medication to the electronic "Automatic Refill" notification system. Patient may have prescription filled one day early if pharmacy is closed on scheduled refill date.    Order Specific Question:   Supervising Provider    Answer:   Milinda Pointer [phone]  . pregabalin (LYRICA) 25 MG capsule    Sig: Take 1 capsule (25 mg total) by mouth 3 (three) times daily.    Dispense:  90 capsule    Refill:  1    Order Specific Question:   Supervising Provider    Answer:   Milinda Pointer [phone]  . NONFORMULARY OR COMPOUNDED ITEM    Sig: Sig: Apply 1-2 gm(s) (2-4 pumps) to affected area, 3-4 times/day. (1 pump  = 0.5 gm)    Dispense:  1 each    Refill:  2    Compounded cream: 2% Gabapentin, 2.2% Lidocaine, 3% diclofenac, 2% Cyclobenzaprine, 2.2% Prilocaine. Dispense: 120 gm Pump Bottle. (Dispenser: 1 pump = 0.5 gm.)    Order Specific Question:   Supervising Provider    Answer:   Milinda Pointer [phone]   New Prescriptions   NONFORMULARY OR COMPOUNDED ITEM    Sig: Apply 1-2 gm(s) (2-4 pumps) to affected area, 3-4 times/day. (1 pump = 0.5 gm)   Medications administered today: [name]. [name] had no medications administered during this visit. Lab-work, procedure(s), and/or referral(s): Orders Placed This Encounter  Procedures  . ToxASSURE Select 13 (MW), Urine   Imaging and/or referral(s): None  Interventional therapies: Planned, scheduled, and/or pending:   Not at this time.   Provider-requested follow-up: Return in about 4 weeks (around 04/08/2018) for MedMgmt.  Future Appointments  Date Time Provider [place]  04/09/2018  9:15 AM Vevelyn Francois, NP [facility] None   Primary Care Physician: Sharyne Peach, MD Location: [facility] Outpatient Pain Management Facility Note by: Vevelyn Francois NP Date: 03/11/2018; Time: 11:17 AM  Pain Score Disclaimer: We use the NRS-11 scale. This is a self-reported, subjective measurement of pain severity with only modest accuracy. It is used primarily to identify changes within a particular patient. It must be understood that outpatient pain scales are significantly less accurate that those used for research, where they can be applied under ideal controlled circumstances with minimal exposure to variables. In reality, the score is likely to be a combination of pain intensity and pain affect, where pain affect describes the degree of emotional arousal or changes in action readiness caused by the sensory experience of pain. Factors such as social and work situation, setting, emotional state, anxiety levels, expectation, and prior pain  experience may influence pain perception and show large inter-individual differences that may also be affected by time variables.  Patient instructions  provided during this appointment: Patient Instructions  ____________________________________________________________________________________________  Medication Rules  Purpose: To inform patients, and their family members, of our rules and regulations.  Applies to: All patients receiving prescriptions (written or electronic).  Pharmacy of record: Pharmacy where electronic prescriptions will be sent. If written prescriptions are taken to a different pharmacy, please inform the nursing staff. The pharmacy listed in the electronic medical record should be the one where you would like electronic prescriptions to be sent.  Electronic prescriptions: In compliance with the [place] (STOP) Act of 2017 (Session Lanny Cramp [phone]), effective March 25, 2018, all controlled substances must be electronically prescribed. Calling prescriptions to the pharmacy will cease to exist.  Prescription refills: Only during scheduled appointments. Applies to all prescriptions.  NOTE: The following applies primarily to controlled substances (Opioid* Pain Medications).   Patient's responsibilities: 1. Pain Pills: Bring all pain pills to every appointment (except for procedure appointments). 2. Pill Bottles: Bring pills in original pharmacy bottle. Always bring the newest bottle. Bring bottle, even if empty. 3. Medication refills: You are responsible for knowing and keeping track of what medications you take and those you need refilled. The day before your appointment: write a list of all prescriptions that need to be refilled. The day of the appointment: give the list to the admitting nurse. Prescriptions will be written only during appointments. If you forget a medication: it will not be "Called in", "Faxed", or  "electronically sent". You will need to get another appointment to get these prescribed. No early refills. Do not call asking to have your prescription filled early. 4. Prescription Accuracy: You are responsible for carefully inspecting your prescriptions before leaving our office. Have the discharge nurse carefully go over each prescription with you, before taking them home. Make sure that your name is accurately spelled, that your address is correct. Check the name and dose of your medication to make sure it is accurate. Check the number of pills, and the written instructions to make sure they are clear and accurate. Make sure that you are given enough medication to last until your next medication refill appointment. 5. Taking Medication: Take medication as prescribed. When it comes to controlled substances, taking less pills or less frequently than prescribed is permitted and encouraged. Never take more pills than instructed. Never take medication more frequently than prescribed.  6. Inform other Doctors: Always inform, all of your healthcare providers, of all the medications you take. 7. Pain Medication from other Providers: You are not allowed to accept any additional pain medication from any other Doctor or Healthcare provider. There are two exceptions to this rule. (see below) In the event that you require additional pain medication, you are responsible for notifying us, as stated below. 8. Medication Agreement: You are responsible for carefully reading and following our Medication Agreement. This must be signed before receiving any prescriptions from our practice. Safely store a copy of your signed Agreement. Violations to the Agreement will result in no further prescriptions. (Additional copies of our Medication Agreement are available upon request.) 9. Laws, Rules, & Regulations: All patients are expected to follow all Federal and [employer], [employer], Rules, [employer]. Ignorance of the Laws  does not constitute a valid excuse. The use of any illegal substances is prohibited. 10. Adopted CDC guidelines & recommendations: Target dosing levels will be at or below 60 MME/day. Use of benzodiazepines** is not recommended.  Exceptions: There are only two exceptions to the rule of not receiving  pain medications from other Healthcare Providers. 1. Exception #1 (Emergencies): In the event of an emergency (i.e.: accident requiring emergency care), you are allowed to receive additional pain medication. However, you are responsible for: As soon as you are able, call our office [phone], at any time of the day or night, and leave a message stating your name, the date and nature of the emergency, and the name and dose of the medication prescribed. In the event that your call is answered by a member of our staff, make sure to document and save the date, time, and the name of the person that took your information.  2. Exception #2 (Planned Surgery): In the event that you are scheduled by another doctor or dentist to have any type of surgery or procedure, you are allowed (for a period no longer than 30 days), to receive additional pain medication, for the acute post-op pain. However, in this case, you are responsible for picking up a copy of our "Post-op Pain Management for Surgeons" handout, and giving it to your surgeon or dentist. This document is available at our office, and does not require an appointment to obtain it. Simply go to our office during business hours (Monday-Thursday from 8:00 AM to 4:00 PM) (Friday 8:00 AM to 12:00 Noon) or if you have a scheduled appointment with [name], prior to your surgery, and ask for it by name. In addition, you will need to provide [name] with your name, name of your surgeon, type of surgery, and date of procedure or surgery.  *Opioid medications include: morphine, codeine, oxycodone, oxymorphone, hydrocodone, hydromorphone, meperidine, tramadol, tapentadol, buprenorphine,  fentanyl, methadone. **Benzodiazepine medications include: diazepam (Valium), alprazolam (Xanax), clonazepam (Klonopine), lorazepam (Ativan), clorazepate (Tranxene), chlordiazepoxide (Librium), estazolam (Prosom), oxazepam (Serax), temazepam (Restoril), triazolam (Halcion) (Last updated: 05/22/2017) ____________________________________________________________________________________________   Hydrocodone - apap 5-325 x 1 month to fill on 03/19/18  Lyrica 25 mg x 1 month to fill today.

## 2018-03-11 NOTE — Progress Notes (Signed)
Nursing Pain Medication Assessment:  Safety precautions to be maintained throughout the outpatient stay will include: orient to surroundings, keep bed in low position, maintain call bell within reach at all times, provide assistance with transfer out of bed and ambulation.  Medication Inspection Compliance: Pill count conducted under aseptic conditions, in front of the patient. Neither the pills nor the bottle was removed from the patient's sight at any time. Once count was completed pills were immediately returned to the patient in their original bottle.  Medication: Hydrocodone/APAP Pill/Patch Count: 33 of 90 pills remain Pill/Patch Appearance: Markings consistent with prescribed medication Bottle Appearance: Standard pharmacy container. Clearly labeled. Filled Date: 3211 / 26 / 2019 Last Medication intake:  Today   Patient had to go to ED in November d/t extreme hand pain on the right.  She was given Toradol 10 mg tablets  Sig 1 po every 6 hours prn for pain.

## 2018-03-11 NOTE — Patient Instructions (Addendum)
____________________________________________________________________________________________  Medication Rules  Purpose: To inform patients, and their family members, of our rules and regulations.  Applies to: All patients receiving prescriptions (written or electronic).  Pharmacy of record: Pharmacy where electronic prescriptions will be sent. If written prescriptions are taken to a different pharmacy, please inform the nursing staff. The pharmacy listed in the electronic medical record should be the one where you would like electronic prescriptions to be sent.  Electronic prescriptions: In compliance with the [place] Strengthen Opioid Misuse Prevention (STOP) Act of 2017 (Session Law 2017-74/H243), effective March 25, 2018, all controlled substances must be electronically prescribed. Calling prescriptions to the pharmacy will cease to exist.  Prescription refills: Only during scheduled appointments. Applies to all prescriptions.  NOTE: The following applies primarily to controlled substances (Opioid* Pain Medications).   Patient's responsibilities: 1. Pain Pills: Bring all pain pills to every appointment (except for procedure appointments). 2. Pill Bottles: Bring pills in original pharmacy bottle. Always bring the newest bottle. Bring bottle, even if empty. 3. Medication refills: You are responsible for knowing and keeping track of what medications you take and those you need refilled. The day before your appointment: write a list of all prescriptions that need to be refilled. The day of the appointment: give the list to the admitting nurse. Prescriptions will be written only during appointments. If you forget a medication: it will not be "Called in", "Faxed", or "electronically sent". You will need to get another appointment to get these prescribed. No early refills. Do not call asking to have your prescription filled early. 4. Prescription Accuracy: You are responsible for  carefully inspecting your prescriptions before leaving our office. Have the discharge nurse carefully go over each prescription with you, before taking them home. Make sure that your name is accurately spelled, that your address is correct. Check the name and dose of your medication to make sure it is accurate. Check the number of pills, and the written instructions to make sure they are clear and accurate. Make sure that you are given enough medication to last until your next medication refill appointment. 5. Taking Medication: Take medication as prescribed. When it comes to controlled substances, taking less pills or less frequently than prescribed is permitted and encouraged. Never take more pills than instructed. Never take medication more frequently than prescribed.  6. Inform other Doctors: Always inform, all of your healthcare providers, of all the medications you take. 7. Pain Medication from other Providers: You are not allowed to accept any additional pain medication from any other Doctor or Healthcare provider. There are two exceptions to this rule. (see below) In the event that you require additional pain medication, you are responsible for notifying us, as stated below. 8. Medication Agreement: You are responsible for carefully reading and following our Medication Agreement. This must be signed before receiving any prescriptions from our practice. Safely store a copy of your signed Agreement. Violations to the Agreement will result in no further prescriptions. (Additional copies of our Medication Agreement are available upon request.) 9. Laws, Rules, & Regulations: All patients are expected to follow all Federal and State Laws, Statutes, Rules, & Regulations. Ignorance of the Laws does not constitute a valid excuse. The use of any illegal substances is prohibited. 10. Adopted CDC guidelines & recommendations: Target dosing levels will be at or below 60 MME/day. Use of benzodiazepines** is not  recommended.  Exceptions: There are only two exceptions to the rule of not receiving pain medications from other Healthcare Providers. 1.   Exception #1 (Emergencies): In the event of an emergency (i.e.: accident requiring emergency care), you are allowed to receive additional pain medication. However, you are responsible for: As soon as you are able, call our office [phone], at any time of the day or night, and leave a message stating your name, the date and nature of the emergency, and the name and dose of the medication prescribed. In the event that your call is answered by a member of our staff, make sure to document and save the date, time, and the name of the person that took your information.  2. Exception #2 (Planned Surgery): In the event that you are scheduled by another doctor or dentist to have any type of surgery or procedure, you are allowed (for a period no longer than 30 days), to receive additional pain medication, for the acute post-op pain. However, in this case, you are responsible for picking up a copy of our "Post-op Pain Management for Surgeons" handout, and giving it to your surgeon or dentist. This document is available at our office, and does not require an appointment to obtain it. Simply go to our office during business hours (Monday-Thursday from 8:00 AM to 4:00 PM) (Friday 8:00 AM to 12:00 Noon) or if you have a scheduled appointment with us, prior to your surgery, and ask for it by name. In addition, you will need to provide us with your name, name of your surgeon, type of surgery, and date of procedure or surgery.  *Opioid medications include: morphine, codeine, oxycodone, oxymorphone, hydrocodone, hydromorphone, meperidine, tramadol, tapentadol, buprenorphine, fentanyl, methadone. **Benzodiazepine medications include: diazepam (Valium), alprazolam (Xanax), clonazepam (Klonopine), lorazepam (Ativan), clorazepate (Tranxene), chlordiazepoxide (Librium), estazolam (Prosom),  oxazepam (Serax), temazepam (Restoril), triazolam (Halcion) (Last updated: 05/22/2017) ____________________________________________________________________________________________   Hydrocodone - apap 5-325 x 1 month to fill on 03/19/18  Lyrica 25 mg x 1 month to fill today.

## 2018-03-12 ENCOUNTER — Other Ambulatory Visit: Payer: Self-pay | Admitting: Gastroenterology

## 2018-03-12 DIAGNOSIS — R1011 Right upper quadrant pain: Secondary | ICD-10-CM

## 2018-03-16 ENCOUNTER — Ambulatory Visit
Admission: RE | Admit: 2018-03-16 | Discharge: 2018-03-16 | Disposition: A | Discharge status: Home / Self Care | Payer: Medicaid Other | Source: Ambulatory Visit | Attending: Gastroenterology | Admitting: Gastroenterology

## 2018-03-16 DIAGNOSIS — K828 Other specified diseases of gallbladder: Secondary | ICD-10-CM | POA: Insufficient documentation

## 2018-03-16 DIAGNOSIS — K760 Fatty (change of) liver, not elsewhere classified: Secondary | ICD-10-CM | POA: Diagnosis not present

## 2018-03-16 DIAGNOSIS — K8020 Calculus of gallbladder without cholecystitis without obstruction: Secondary | ICD-10-CM | POA: Diagnosis not present

## 2018-03-16 DIAGNOSIS — R1011 Right upper quadrant pain: Secondary | ICD-10-CM | POA: Diagnosis not present

## 2018-03-16 LAB — TOXASSURE SELECT 13 (MW), URINE

## 2018-04-01 ENCOUNTER — Encounter
Admission: RE | Admit: 2018-04-01 | Discharge: 2018-04-01 | Disposition: A | Discharge status: Home / Self Care | Payer: Medicaid Other | Source: Ambulatory Visit | Attending: Gastroenterology | Admitting: Gastroenterology

## 2018-04-01 DIAGNOSIS — R1011 Right upper quadrant pain: Secondary | ICD-10-CM | POA: Insufficient documentation

## 2018-04-01 MED ORDER — TECHNETIUM TC 99M MEBROFENIN IV KIT
5.4470 | PACK | Freq: Once | INTRAVENOUS | Status: AC | PRN
  Administered 2018-04-01: 5.447 via INTRAVENOUS

## 2018-04-02 ENCOUNTER — Ambulatory Visit: Payer: Self-pay | Admitting: General Surgery

## 2018-04-02 NOTE — H&P (View-Only) (Signed)
HISTORY OF PRESENT ILLNESS:    [name] is a 57 y.o.female patient who comes for re evaluation of abdominal pain.  Patient refers that continue with right upper quadrant abdominal pain. Pain is now more frequent in the right upper quadrant than in other areas. Refers associated nausea that is controlled with ondansetron. Pain radiates to the right back. Pain associated with food. Patient was evaluated by GI who recommended to be seen by Surgery for evaluation of cholecystectomy. No alleviator factor.       PAST MEDICAL HISTORY:      Past Medical History:  Diagnosis Date  . Aortic atherosclerosis (CMS-HCC) 11/26/2017   CT abdomen 06/2017 [facility]  . Cardiac murmur 08/28/2016  . Chronic constipation 03/04/2018  . Depression    followed by [facility]  . Diabetes mellitus type 2, uncomplicated (CMS-HCC)   . Diverticulitis 10/2016  . Essential hypertension 05/29/2016  . Fatty liver disease, nonalcoholic 10/25/2016  . Fibromyalgia   . Hyperlipidemia   . Hyperlipidemia, mixed 05/29/2016  . Hypertension   . Intractable chronic migraine without aura and without status migrainosus 09/25/2016  . Smoker   . Type 2 diabetes mellitus not at goal (CMS-HCC) 05/29/2016        PAST SURGICAL HISTORY:        Past Surgical History:  Procedure Laterality Date  . COLONOSCOPY  02/20/2017   HP/Repeat 10yrs/MUS  . EGD  02/20/2017   MILD CHRONIC GASTRITIS/No Repeat/MUS  . EXPLORATORY LAPAROTOMY    . HYSTERECTOMY           MEDICATIONS:  EncounterMedications        Outpatient Encounter Medications as of 04/02/2018  Medication Sig Dispense Refill  . albuterol (VENTOLIN HFA) 90 mcg/actuation inhaler Inhale into the lungs.    . ASPIRIN (ASPIR-81 ORAL) Take by mouth.    . blood glucose diagnostic test strip Use 3 (three) times daily Use as instructed. E11.9 100 each 12  . blood glucose meter kit Use as directed e11.9 1 each 0  . cetirizine (ZYRTEC) 10 MG tablet Take 1 tablet (10 mg total) by  mouth nightly 30 tablet 11  . citalopram (CELEXA) 40 MG tablet Take 40 mg by mouth once daily.    . dexlansoprazole (DEXILANT) 30 mg DR capsule Take 1 capsule (30 mg total) by mouth once daily 30 capsule 2  . diclofenac (VOLTAREN) 1 % topical gel APPLY 4 GRAMS TOPICALLY QID  1  . famotidine (PEPCID) 40 MG tablet Take 1 tablet (40 mg total) by mouth nightly 30 tablet 11  . fluticasone propionate (FLONASE) 50 mcg/actuation nasal spray Place 2 sprays into both nostrils once daily    . HYDROcodone-acetaminophen (NORCO) 5-325 mg tablet Take 0.5 tablets by mouth every 8 (eight) hours as needed for Pain 30 tablet 0  . hyoscyamine (LEVBID) 0.375 mg ER tablet Take 1 tablet (0.375 mg total) by mouth every 12 (twelve) hours For abd pain 60 capsule 1  . insulin DETEMIR (LEVEMIR FLEXTOUCH) pen injector (concentration 100 units/mL) Inject 44 Units subcutaneously nightly 13.2 mL 11  . lancing device with lancets kit Use 1 each 3 (three) times daily e11.9 100 each 11  . LINZESS 290 mcg capsule take 1 capsule by mouth once daily 30 capsule 6  . lisinopril (ZESTRIL) 10 MG tablet Take 1 tablet (10 mg total) by mouth once daily 45 tablet 11  . metFORMIN (GLUCOPHAGE) 1000 MG tablet Take 1 tablet (1,000 mg total) by mouth 2 (two) times daily with meals 60 tablet 10  .   mirtazapine (REMERON) 15 MG tablet Take 1 tablet (15 mg total) by mouth nightly 30 tablet 11  . ondansetron (ZOFRAN) 4 MG tablet Take 1 tablet (4 mg total) by mouth every 8 (eight) hours as needed for Nausea 30 tablet 1  . pen needle, diabetic 31 gauge x 5/16" needle Use as directed 100 each 12  . polyethylene glycol (MIRALAX) powder Take 17 g by mouth as directed for constipation. Mix in 4-8ounces of fluid prior to taking. 510 g 4  . pregabalin (LYRICA) 25 MG capsule Take 25 mg by mouth once daily    . rizatriptan (MAXALT-MLT) 10 MG disintegrating tablet Take 10 mg by mouth once as needed for Migraine. May take a second dose after 2 hours if  needed.    . rosuvastatin (CRESTOR) 20 MG tablet Take 1 tablet (20 mg total) by mouth once daily 30 tablet 11  . topiramate (TOPAMAX) 25 MG tablet Take 1 tablet (25 mg total) by mouth 2 (two) times daily 60 tablet 10  . traZODone (DESYREL) 100 MG tablet Take by mouth nightly as needed for Sleep.     No facility-administered encounter medications on file as of 04/02/2018.        ALLERGIES:   Bactrim [sulfamethoxazole-trimethoprim]; Diphenhydramine hcl; Tramadol; Valdecoxib; and Canagliflozin   SOCIAL HISTORY:  Social History          Socioeconomic History  . Marital status: Divorced    Spouse name: Not on file  . Number of children: Not on file  . Years of education: Not on file  . Highest education level: Not on file  Occupational History  . Not on file  Social Needs  . Financial resource strain: Not on file  . Food insecurity:    Worry: Not on file    Inability: Not on file  . Transportation needs:    Medical: Not on file    Non-medical: Not on file  Tobacco Use  . Smoking status: Current Every Day Smoker    Packs/day: 0.25    Years: 35.00    Pack years: 8.75    Types: Cigarettes  . Smokeless tobacco: Never Used  Substance and Sexual Activity  . Alcohol use: No  . Drug use: No  . Sexual activity: Not Currently  Other Topics Concern  . Not on file  Social History Narrative   Formerly worked in housekeeping, on disability for Fibromyalgia      FAMILY HISTORY:  History reviewed. No pertinent family history.   GENERAL REVIEW OF SYSTEMS:   General ROS: negative for - chills, fatigue, fever, weight gain or weight loss Allergy and Immunology ROS: negative for - hives  Hematological and Lymphatic ROS: negative for - bleeding problems or bruising, negative for palpable nodes Endocrine ROS: negative for - heat or cold intolerance, hair changes Respiratory ROS: negative for - cough, shortness of breath or wheezing Cardiovascular ROS: no  chest pain or palpitations GI ROS: positive for nausea, vomiting, abdominal pain, constipation. Negative for diarrhea.  Musculoskeletal ROS: negative for - joint swelling or muscle pain Neurological ROS: negative for - confusion, syncope Dermatological ROS: negative for pruritus and rash  PHYSICAL EXAM:  Vitals:   04/02/18 0817  BP: (!) 165/113  Pulse: 97  Temp: 36.7 C (98.1 F)  .  Ht:149.9 cm (4' 11") Wt:58.6 kg (129 lb 3 oz) BSA:Body surface area is 1.56 meters squared. Body mass index is 26.09 kg/m..   GENERAL: Alert, active, oriented x3  HEENT: Pupils equal reactive to light.   Extraocular movements are intact. Sclera clear. Palpebral conjunctiva normal red color.Pharynx clear.  NECK: Supple with no palpable mass and no adenopathy.  LUNGS: Bilateral rhonchi more pronounced on the left lungs.  HEART: Regular rhythm S1 and S2 without murmur.  ABDOMEN: Soft and depressible, nontender with no palpable mass, no hepatomegaly.   EXTREMITIES: Well-developed well-nourished symmetrical with no dependent edema.  NEUROLOGICAL: Awake alert oriented, facial expression symmetrical, moving all extremities.      IMPRESSION:     Cholelithiasis without cholecystitis [K80.20]   Patient oriented about about cholelithiasis. Also re oriented about what is the gallbladder, its anatomy and function and the implications of having stones. The patient was oriented about the treatment alternatives (observation vs cholecystectomy). Patient was oriented that a low percentage of patient will continue to have similar pain symptoms even after the gallbladder is removed and even more in her with the different diagnosis of esophagitis, gastritis, IBS, fibromyalgia, among others. Surgical technique (open vs laparoscopic) was discussed. It was also discussed the goals of the surgery (decrease the pain episodes and avoid the risk of cholecystitis) and the risk of surgery including: bleeding, infection,  common bile duct injury, stone retention, injury to other organs such as bowel, liver, stomach, other complications such as hernia, bowel obstruction among others. Also discussed with patient about anesthesia and its complications such as: reaction to medications, pneumonia, heart complications, death, among others.  Due to patient persistent cough, I will recommend patient to see her PCP for evaluation of bronchitis, most likely associated with persistent smoking.      PLAN:  1. Laparoscopic Cholecystectomy (47562) 2. CBC, CMP 3. Internal medicine clearance (Dr. George). Patient needs to be seen by her due to persistent cough.  4. Recommended to stop smoking 5. Avoid aspiring 5 days before surgery.   Patient or representative verbalized understanding, all questions were answered, and were agreeable with the plan outlined above.   This was a 40 minute encounter most of the time trying to explaining the patient about the surgery, the recovery, the risks of surgery and the goal from surgical management.   Mindy Behnken Cintron-Diaz, MD  Electronically signed by Amberia Bayless Cintron-Diaz, MD  

## 2018-04-02 NOTE — H&P (Signed)
HISTORY OF PRESENT ILLNESS:    Ms. Guest is a 58 y.o.female patient who comes for re evaluation of abdominal pain.  Patient refers that continue with right upper quadrant abdominal pain. Pain is now more frequent in the right upper quadrant than in other areas. Refers associated nausea that is controlled with ondansetron. Pain radiates to the right back. Pain associated with food. Patient was evaluated by GI who recommended to be seen by Surgery for evaluation of cholecystectomy. No alleviator factor.       PAST MEDICAL HISTORY:      Past Medical History:  Diagnosis Date  . Aortic atherosclerosis (CMS-HCC) 11/26/2017   CT abdomen 06/2017 [facility]  . Cardiac murmur 08/28/2016  . Chronic constipation 03/04/2018  . Depression    followed by [facility]  . Diabetes mellitus type 2, uncomplicated (CMS-HCC)   . Diverticulitis 10/2016  . Essential hypertension 05/29/2016  . Fatty liver disease, nonalcoholic 10/26/1515  . Fibromyalgia   . Hyperlipidemia   . Hyperlipidemia, mixed 05/29/2016  . Hypertension   . Intractable chronic migraine without aura and without status migrainosus 09/25/2016  . Smoker   . Type 2 diabetes mellitus not at goal (CMS-HCC) 05/29/2016        PAST SURGICAL HISTORY:        Past Surgical History:  Procedure Laterality Date  . COLONOSCOPY  02/20/2017   HP/Repeat 64yr/MUS  . EGD  02/20/2017   MILD CHRONIC GASTRITIS/No Repeat/MUS  . EXPLORATORY LAPAROTOMY    . HYSTERECTOMY           MEDICATIONS:  EncounterMedications        Outpatient Encounter Medications as of 04/02/2018  Medication Sig Dispense Refill  . albuterol (VENTOLIN HFA) 90 mcg/actuation inhaler Inhale into the lungs.    . ASPIRIN (ASPIR-81 ORAL) Take by mouth.    . blood glucose diagnostic test strip Use 3 (three) times daily Use as instructed. E11.9 100 each 12  . blood glucose meter kit Use as directed e11.9 1 each 0  . cetirizine (ZYRTEC) 10 MG tablet Take 1 tablet (10 mg total) by  mouth nightly 30 tablet 11  . citalopram (CELEXA) 40 MG tablet Take 40 mg by mouth once daily.    .Marland Kitchendexlansoprazole (DEXILANT) 30 mg DR capsule Take 1 capsule (30 mg total) by mouth once daily 30 capsule 2  . diclofenac (VOLTAREN) 1 % topical gel APPLY 4 GRAMS TOPICALLY QID  1  . famotidine (PEPCID) 40 MG tablet Take 1 tablet (40 mg total) by mouth nightly 30 tablet 11  . fluticasone propionate (FLONASE) 50 mcg/actuation nasal spray Place 2 sprays into both nostrils once daily    . HYDROcodone-acetaminophen (NORCO) 5-325 mg tablet Take 0.5 tablets by mouth every 8 (eight) hours as needed for Pain 30 tablet 0  . hyoscyamine (LEVBID) 0.375 mg ER tablet Take 1 tablet (0.375 mg total) by mouth every 12 (twelve) hours For abd pain 60 capsule 1  . insulin DETEMIR (LEVEMIR FLEXTOUCH) pen injector (concentration 100 units/mL) Inject 44 Units subcutaneously nightly 13.2 mL 11  . lancing device with lancets kit Use 1 each 3 (three) times daily e11.9 100 each 11  . LINZESS 290 mcg capsule take 1 capsule by mouth once daily 30 capsule 6  . lisinopril (ZESTRIL) 10 MG tablet Take 1 tablet (10 mg total) by mouth once daily 45 tablet 11  . metFORMIN (GLUCOPHAGE) 1000 MG tablet Take 1 tablet (1,000 mg total) by mouth 2 (two) times daily with meals 60 tablet 10  .  mirtazapine (REMERON) 15 MG tablet Take 1 tablet (15 mg total) by mouth nightly 30 tablet 11  . ondansetron (ZOFRAN) 4 MG tablet Take 1 tablet (4 mg total) by mouth every 8 (eight) hours as needed for Nausea 30 tablet 1  . pen needle, diabetic 31 gauge x 5/16" needle Use as directed 100 each 12  . polyethylene glycol (MIRALAX) powder Take 17 g by mouth as directed for constipation. Mix in 4-8ounces of fluid prior to taking. 510 g 4  . pregabalin (LYRICA) 25 MG capsule Take 25 mg by mouth once daily    . rizatriptan (MAXALT-MLT) 10 MG disintegrating tablet Take 10 mg by mouth once as needed for Migraine. May take a second dose after 2 hours if  needed.    . rosuvastatin (CRESTOR) 20 MG tablet Take 1 tablet (20 mg total) by mouth once daily 30 tablet 11  . topiramate (TOPAMAX) 25 MG tablet Take 1 tablet (25 mg total) by mouth 2 (two) times daily 60 tablet 10  . traZODone (DESYREL) 100 MG tablet Take by mouth nightly as needed for Sleep.     No facility-administered encounter medications on file as of 04/02/2018.        ALLERGIES:   Bactrim [sulfamethoxazole-trimethoprim]; Diphenhydramine hcl; Tramadol; Valdecoxib; and Canagliflozin   SOCIAL HISTORY:  Social History          Socioeconomic History  . Marital status: Divorced    Spouse name: Not on file  . Number of children: Not on file  . Years of education: Not on file  . Highest education level: Not on file  Occupational History  . Not on file  Social Needs  . Financial resource strain: Not on file  . Food insecurity:    Worry: Not on file    Inability: Not on file  . Transportation needs:    Medical: Not on file    Non-medical: Not on file  Tobacco Use  . Smoking status: Current Every Day Smoker    Packs/day: 0.25    Years: 35.00    Pack years: 8.75    Types: Cigarettes  . Smokeless tobacco: Never Used  Substance and Sexual Activity  . Alcohol use: No  . Drug use: No  . Sexual activity: Not Currently  Other Topics Concern  . Not on file  Social History Narrative   Formerly worked in housekeeping, on disability for Fibromyalgia      FAMILY HISTORY:  History reviewed. No pertinent family history.   GENERAL REVIEW OF SYSTEMS:   General ROS: negative for - chills, fatigue, fever, weight gain or weight loss Allergy and Immunology ROS: negative for - hives  Hematological and Lymphatic ROS: negative for - bleeding problems or bruising, negative for palpable nodes Endocrine ROS: negative for - heat or cold intolerance, hair changes Respiratory ROS: negative for - cough, shortness of breath or wheezing Cardiovascular ROS: no  chest pain or palpitations GI ROS: positive for nausea, vomiting, abdominal pain, constipation. Negative for diarrhea.  Musculoskeletal ROS: negative for - joint swelling or muscle pain Neurological ROS: negative for - confusion, syncope Dermatological ROS: negative for pruritus and rash  PHYSICAL EXAM:  Vitals:   04/02/18 0817  BP: (!) 165/113  Pulse: 97  Temp: 36.7 C (98.1 F)  .  Ht:149.9 cm (_0 ) Wt:58.6 kg (129 lb 3 oz) HDQ:QIWL surface area is 1.56 meters squared. Body mass index is 26.09 kg/m.Marland Kitchen   GENERAL: Alert, active, oriented x3  HEENT: Pupils equal reactive to light.  Extraocular movements are intact. Sclera clear. Palpebral conjunctiva normal red color.Pharynx clear.  NECK: Supple with no palpable mass and no adenopathy.  LUNGS: Bilateral rhonchi more pronounced on the left lungs.  HEART: Regular rhythm S1 and S2 without murmur.  ABDOMEN: Soft and depressible, nontender with no palpable mass, no hepatomegaly.   EXTREMITIES: Well-developed well-nourished symmetrical with no dependent edema.  NEUROLOGICAL: Awake alert oriented, facial expression symmetrical, moving all extremities.      IMPRESSION:     Cholelithiasis without cholecystitis [K80.20]   Patient oriented about about cholelithiasis. Also re oriented about what is the gallbladder, its anatomy and function and the implications of having stones. The patient was oriented about the treatment alternatives (observation vs cholecystectomy). Patient was oriented that a low percentage of patient will continue to have similar pain symptoms even after the gallbladder is removed and even more in her with the different diagnosis of esophagitis, gastritis, IBS, fibromyalgia, among others. Surgical technique (open vs laparoscopic) was discussed. It was also discussed the goals of the surgery (decrease the pain episodes and avoid the risk of cholecystitis) and the risk of surgery including: bleeding, infection,  common bile duct injury, stone retention, injury to other organs such as bowel, liver, stomach, other complications such as hernia, bowel obstruction among others. Also discussed with patient about anesthesia and its complications such as: reaction to medications, pneumonia, heart complications, death, among others.  Due to patient persistent cough, I will recommend patient to see her PCP for evaluation of bronchitis, most likely associated with persistent smoking.      PLAN:  1. Laparoscopic Cholecystectomy (32202) 2. CBC, CMP 3. Internal medicine clearance (Dr. Iona Beard). Patient needs to be seen by her due to persistent cough.  4. Recommended to stop smoking 5. Avoid aspiring 5 days before surgery.   Patient or representative verbalized understanding, all questions were answered, and were agreeable with the plan outlined above.   This was a 40 minute encounter most of the time trying to explaining the patient about the surgery, the recovery, the risks of surgery and the goal from surgical management.   Herbert Pun, MD  Electronically signed by Herbert Pun, MD

## 2018-04-09 ENCOUNTER — Ambulatory Visit: Payer: Medicaid Other | Attending: Nurse Practitioner | Admitting: Nurse Practitioner

## 2018-04-09 ENCOUNTER — Encounter: Payer: Self-pay | Admitting: Nurse Practitioner

## 2018-04-09 ENCOUNTER — Other Ambulatory Visit: Payer: Self-pay

## 2018-04-09 VITALS — BP 102/86 | HR 106 | Temp 98.1°F | Ht 59.0 in | Wt 124.0 lb

## 2018-04-09 DIAGNOSIS — M5416 Radiculopathy, lumbar region: Secondary | ICD-10-CM | POA: Diagnosis present

## 2018-04-09 DIAGNOSIS — M5136 Other intervertebral disc degeneration, lumbar region: Secondary | ICD-10-CM | POA: Diagnosis not present

## 2018-04-09 DIAGNOSIS — G894 Chronic pain syndrome: Secondary | ICD-10-CM

## 2018-04-09 DIAGNOSIS — M797 Fibromyalgia: Secondary | ICD-10-CM | POA: Diagnosis not present

## 2018-04-09 MED ORDER — HYDROCODONE-ACETAMINOPHEN 5-325 MG PO TABS: 1.0000 | ORAL_TABLET | Freq: Three times a day (TID) | ORAL | 0 refills | Status: DC | PRN

## 2018-04-09 NOTE — Progress Notes (Signed)
Patient's Name: [name]  MRN: [id number]  Referring Provider: Sharyne Peach, MD  DOB: 1960-09-21  PCP: Sharyne Peach, MD  DOS: 04/09/2018  Note by: Vevelyn Francois NP  Service setting: Ambulatory outpatient  Specialty: Interventional Pain Management  Location: [facility] (AMB) Pain Management Facility    Patient type: Established    Primary Reason(s) for Visit: Encounter for prescription drug management. (Level of risk: moderate)  CC: Back Pain  HPI  [name] is a 58 y.o. year old, female patient, who comes today for a medication management evaluation. She has Essential hypertension; Diabetes ([place]); Fibromyalgia; COPD (chronic obstructive pulmonary disease) ([place]); Diverticulitis; Lumbar radiculopathy; Chronic bilateral low back pain with bilateral sciatica; Chronic pain syndrome; Chronic, continuous use of opioids; Chronic myofascial pain; Benign paroxysmal positional vertigo; Cardiac murmur; DDD (degenerative disc disease), lumbar; Depression; Fatty liver disease, nonalcoholic; Primary osteoarthritis of both hips; Hyperlipidemia, mixed; Smoker; Type 2 diabetes mellitus without complications ([place]); Aortic atherosclerosis ([place]); Pharmacologic therapy; Disorder of skeletal system; Problems influencing health status; Calculus of gallbladder without cholecystitis without obstruction; Chronic constipation; Pelvic pain in female; and Pain in both hands on their problem list. Her primarily concern today is the Back Pain  Pain Assessment: Location: Lower Back Radiating: pain radiaties Onset: More than a month ago Duration: Chronic pain Quality: Discomfort, Aching, Constant Severity: 8 /10 (subjective, self-reported pain score)  Note: Reported level is compatible with observation. Clinically the patient looks like a 1/10       Information on the proper use of the pain scale provided to the patient today. When using our objective Pain Scale, levels between 6 and 10/10 are said to belong  in an emergency room, as it progressively worsens from a 6/10, described as severely limiting, requiring emergency care not usually available at an outpatient pain management facility. At a 6/10 level, communication becomes difficult and requires great effort. Assistance to reach the emergency department may be required. Facial flushing and profuse sweating along with potentially dangerous increases in heart rate and blood pressure will be evident. Effect on ADL: some days i have to lay down to rest, my activities are limited Timing: Constant Modifying factors: pain medications BP: 102/86  HR: (!) 106  [name] was last scheduled for an appointment on 03/11/2018 for medication management. During today's appointment we reviewed [name]'s chronic pain status, as well as her outpatient medication regimen. She is going to have her gall bladder removed on 04/27/2018. She is concern about her pain.   The patient  reports no history of drug use. Her body mass index is 25.04 kg/m.  Further details on both, my assessment(s), as well as the proposed treatment plan, please see below.  Controlled Substance Pharmacotherapy Assessment REMS (Risk Evaluation and Mitigation Strategy)  Analgesic:Hydrocodone 5 mg TID prn MME/day:68m/day BChauncey Fischer RN  04/09/2018  9:36 AM  Sign when Signing Visit Nursing Pain Medication Assessment:  Safety precautions to be maintained throughout the outpatient stay will include: orient to surroundings, keep bed in low position, maintain call bell within reach at all times, provide assistance with transfer out of bed and ambulation.  Medication Inspection Compliance: Pill count conducted under aseptic conditions, in front of the patient. Neither the pills nor the bottle was removed from the patient's sight at any time. Once count was completed pills were immediately returned to the patient in their original bottle.  Medication: Hydrocodone/APAP Pill/Patch  Count: 39 of 90 pills remain Pill/Patch Appearance: Markings consistent with prescribed medication  Bottle Appearance: Standard pharmacy container. Clearly labeled. Filled Date: 55 / 26 / 2019 Last Medication intake:  Today   Pharmacokinetics: Liberation and absorption (onset of action): WNL Distribution (time to peak effect): WNL Metabolism and excretion (duration of action): WNL         Pharmacodynamics: Desired effects: Analgesia: [name] reports >50% benefit. Functional ability: Patient reports that medication allows her to accomplish basic ADLs Clinically meaningful improvement in function (CMIF): Sustained CMIF goals met Perceived effectiveness: Described as relatively effective, allowing for increase in activities of daily living (ADL) Undesirable effects: Side-effects or Adverse reactions: None reported Monitoring: [place] PMP: Online review of the past 53-monthperiod conducted. Compliant with practice rules and regulations Last UDS on record: Summary  Date Value Ref Range Status  03/11/2018 FINAL  Final    Comment:    ==================================================================== TOXASSURE SELECT 13 (MW) ==================================================================== Test                             Result       Flag       Units Drug Present and Declared for Prescription Verification   Hydrocodone                    521          EXPECTED   ng/mg creat   Hydromorphone                  123          EXPECTED   ng/mg creat   Dihydrocodeine                 148          EXPECTED   ng/mg creat   Norhydrocodone                 567          EXPECTED   ng/mg creat    Sources of hydrocodone include scheduled prescription    medications. Hydromorphone, dihydrocodeine and norhydrocodone are    expected metabolites of hydrocodone. Hydromorphone and    dihydrocodeine are also available as scheduled prescription     medications. ==================================================================== Test                      Result    Flag   Units      Ref Range   Creatinine              137              mg/dL      >=20 ==================================================================== Declared Medications:  The flagging and interpretation on this report are based on the  following declared medications.  Unexpected results may arise from  inaccuracies in the declared medications.  **Note: The testing scope of this panel includes these medications:  Hydrocodone (Norco)  Hydrocodone (Vicodin)  **Note: The testing scope of this panel does not include following  reported medications:  Acetaminophen (Norco)  Acetaminophen (Vicodin)  Albuterol  Aspirin (Aspirin 81)  Cetirizine (Zyrtec)  Fluticasone (Flonase)  Hyoscyamine (Anaspaz)  Hyoscyamine (Levsin)  Insulin  Ketorolac (Toradol)  Linaclotide (Linzess)  Lisinopril  Metformin  Mirtazapine (Remeron)  Ondansetron (Zofran)  Polyethylene Glycol (MiraLAX)  Pregabalin  Ranitidine (Zantac)  Rizatriptan (Maxalt)  Rosuvastatin (Crestor)  Sucralfate (Carafate)  Topical Diclofenac (Voltaren)  Topiramate (Topamax)  Trazodone (Desyrel) ==================================================================== For clinical consultation, please call [phone] ====================================================================  UDS interpretation: Compliant          Medication Assessment Form: Reviewed. Patient indicates being compliant with therapy Treatment compliance: Compliant Risk Assessment Profile: Aberrant behavior: See prior evaluations. None observed or detected today Comorbid factors increasing risk of overdose: See prior notes. No additional risks detected today Opioid risk tool (ORT) (Total Score): 5 Personal History of Substance Abuse (SUD-Substance use disorder):  Alcohol: Positive Female or Female  Illegal Drugs: Negative  Rx  Drugs: Negative  ORT Risk Level calculation: Moderate Risk Risk of substance use disorder (SUD): Low Opioid Risk Tool - 04/09/18 0943      Family History of Substance Abuse   Alcohol  Positive Female    Illegal Drugs  Negative    Rx Drugs  Negative      Personal History of Substance Abuse   Alcohol  Positive Female or Female    Illegal Drugs  Negative    Rx Drugs  Negative      Age   Age between 87-45 years   No      History of Preadolescent Sexual Abuse   History of Preadolescent Sexual Abuse  Negative or Female      Psychological Disease   Psychological Disease  Negative    Depression  Positive      Total Score   Opioid Risk Tool Scoring  5    Opioid Risk Interpretation  Moderate Risk      ORT Scoring interpretation table:  Score <3 = Low Risk for SUD  Score between 4-7 = Moderate Risk for SUD  Score >8 = High Risk for Opioid Abuse   Risk Mitigation Strategies:  Patient Counseling: Covered Patient-Prescriber Agreement (PPA): Present and active  Notification to other healthcare providers: Done  Pharmacologic Plan: No change in therapy, at this time.             Laboratory Chemistry  Inflammation Markers (CRP: Acute Phase) (ESR: Chronic Phase) Lab Results  Component Value Date   CRP <1 11/27/2017   ESRSEDRATE 17 11/27/2017   LATICACIDVEN 1.6 10/14/2016                         Rheumatology Markers No results found for: RF, ANA, LABURIC, URICUR, LYMEIGGIGMAB, LYMEABIGMQN, HLAB27                      Renal Function Markers Lab Results  Component Value Date   BUN 10 12/07/2017   CREATININE 0.74 12/07/2017   BCR 14 11/27/2017   GFRAA >60 12/07/2017   GFRNONAA >60 12/07/2017                             Hepatic Function Markers Lab Results  Component Value Date   AST 22 12/07/2017   ALT 28 12/07/2017   ALBUMIN 4.0 12/07/2017   ALKPHOS 74 12/07/2017   LIPASE 35 12/07/2017                        Electrolytes Lab Results  Component Value Date   NA 143  12/07/2017   K 3.7 12/07/2017   CL 112 (H) 12/07/2017   CALCIUM 9.4 12/07/2017   MG 1.8 11/27/2017                        Neuropathy Markers Lab Results  Component Value Date   HGBA1C 10.4 (  H) 05/02/2016   HIV Non Reactive 10/15/2016                        CNS Tests No results found for: COLORCSF, APPEARCSF, RBCCOUNTCSF, WBCCSF, POLYSCSF, LYMPHSCSF, EOSCSF, PROTEINCSF, GLUCCSF, JCVIRUS, CSFOLI, IGGCSF                      Bone Pathology Markers No results found for: VD25OH, [id number], OZ3086VH8, IO9629BM8, 25OHVITD1, 25OHVITD2, 25OHVITD3, TESTOFREE, TESTOSTERONE                       Coagulation Parameters Lab Results  Component Value Date   PLT 255 12/07/2017                        Cardiovascular Markers Lab Results  Component Value Date   TROPONINI <0.03 10/16/2016   HGB 12.7 12/07/2017   HCT 38.3 12/07/2017                         CA Markers No results found for: CEA, CA125, LABCA2                      Note: Lab results reviewed.  Recent Diagnostic Imaging Results  NM Hepato W/EjeCT Fract CLINICAL DATA:  Right upper quadrant pain  EXAM: NUCLEAR MEDICINE HEPATOBILIARY IMAGING WITH GALLBLADDER EF  VIEWS: Anterior right upper quadrant  RADIOPHARMACEUTICALS:  5.447 mCi Tc-40m Choletec IV  COMPARISON:  None.  FINDINGS: Liver uptake of radiotracer is unremarkable. There is prompt visualization of gallbladder and small bowel, indicating patency of the cystic and common bile ducts. Patient consumed 8 ounces of Ensure orally with calculation of the computer generated ejection fraction of radiotracer from the gallbladder. The patient did not experience clinical symptoms with the oral Ensure consumption. The computer generated ejection fraction of radiotracer from the gallbladder is normal at 71%, normal greater than 33% using the oral agent.  IMPRESSION: Study within normal limits.  Electronically Signed   By: WLowella GripIII M.D.   On:  04/01/2018 12:43  Complexity Note: Imaging results reviewed. Results shared with [name], using Layman's terms.                         Meds   Current Outpatient Medications:  .  ACCU-CHEK GUIDE test strip, USE TID, Disp: , Rfl: 12 .  albuterol (PROVENTIL HFA;VENTOLIN HFA) 108 (90 Base) MCG/ACT inhaler, Inhale 1 puff into the lungs 2 (two) times daily as needed., Disp: , Rfl:  .  aspirin 81 MG tablet, Take 81 mg by mouth daily. , Disp: , Rfl:  .  Dexlansoprazole (DEXILANT) 30 MG capsule, Take 30 mg by mouth daily., Disp: , Rfl:  .  diclofenac sodium (VOLTAREN) 1 % GEL, Apply 4 g topically 4 (four) times daily., Disp: 100 g, Rfl: 1 .  famotidine (PEPCID) 40 MG tablet, Take 40 mg by mouth at bedtime., Disp: , Rfl:  .  [START ON 04/18/2018] HYDROcodone-acetaminophen (NORCO/VICODIN) 5-325 MG tablet, Take 1 tablet by mouth 3 (three) times daily as needed for up to 30 days., Disp: 90 tablet, Rfl: 0 .  hyoscyamine (LEVBID) 0.375 MG 12 hr tablet, Take 0.375 mg by mouth every 12 (twelve) hours as needed for cramping., Disp: , Rfl:  .  ketorolac (TORADOL) 10 MG tablet, Take 1 tablet (10 mg total)  by mouth every 6 (six) hours as needed., Disp: 20 tablet, Rfl: 0 .  LEVEMIR FLEXTOUCH 100 UNIT/ML Pen, Inject 44 Units into the skin at bedtime. , Disp: , Rfl: 11 .  linaclotide (LINZESS) 290 MCG CAPS capsule, Take 290 mcg by mouth daily before breakfast., Disp: , Rfl:  .  lisinopril (PRINIVIL,ZESTRIL) 10 MG tablet, Take 15 mg by mouth daily. , Disp: , Rfl:  .  meclizine (ANTIVERT) 25 MG tablet, Take 25 mg by mouth 3 (three) times daily as needed for dizziness., Disp: , Rfl:  .  metFORMIN (GLUCOPHAGE) 1000 MG tablet, Take 1,000 mg by mouth 2 (two) times daily. , Disp: , Rfl:  .  mirtazapine (REMERON) 15 MG tablet, Take 15 mg by mouth at bedtime., Disp: , Rfl:  .  NONFORMULARY OR COMPOUNDED ITEM, Sig: Apply 1-2 gm(s) (2-4 pumps) to affected area, 3-4 times/day. (1 pump = 0.5 gm), Disp: 1 each, Rfl: 2 .   ondansetron (ZOFRAN) 4 MG tablet, Take 4 mg by mouth every 8 (eight) hours as needed for nausea or vomiting. , Disp: , Rfl: 0 .  polyethylene glycol (MIRALAX / GLYCOLAX) packet, Take 17 g by mouth daily., Disp: , Rfl:  .  pregabalin (LYRICA) 25 MG capsule, Take 1 capsule (25 mg total) by mouth 3 (three) times daily., Disp: 90 capsule, Rfl: 1 .  rizatriptan (MAXALT) 10 MG tablet, Take 10 mg by mouth as needed for migraine. May repeat in 2 hours if needed, Disp: , Rfl:  .  rosuvastatin (CRESTOR) 20 MG tablet, Take 20 mg by mouth daily., Disp: , Rfl:  .  topiramate (TOPAMAX) 25 MG tablet, Take 25 mg by mouth 2 (two) times daily., Disp: , Rfl:  .  traZODone (DESYREL) 100 MG tablet, Take 100 mg by mouth at bedtime. , Disp: , Rfl:   ROS  Constitutional: Denies any fever or chills Gastrointestinal: No reported hemesis, hematochezia, vomiting, or acute GI distress Musculoskeletal: Denies any acute onset joint swelling, redness, loss of ROM, or weakness Neurological: No reported episodes of acute onset apraxia, aphasia, dysarthria, agnosia, amnesia, paralysis, loss of coordination, or loss of consciousness  Allergies  [name] is allergic to tramadol; benadryl [diphenhydramine hcl]; bextra [valdecoxib]; invokana [canagliflozin]; and sulfamethoxazole-trimethoprim.  PFSH  Drug: [name]  reports no history of drug use. Alcohol:  reports no history of alcohol use. Tobacco:  reports that she has been smoking cigarettes. She has been smoking about 0.50 packs per day. She uses smokeless tobacco. Medical:  has a past medical history of Asthma, Asthma, BPPV (benign paroxysmal positional vertigo), Cardiac murmur, unspecified (08/28/2016), Degenerative disc disease, lumbar, Depression, Diabetes mellitus, Diverticulitis (10/2016), Fatty liver, Fibromyalgia, Fibromyalgia, GERD (gastroesophageal reflux disease), Headache, History of degenerative disc disease, Hyperlipidemia, and Hypertension. Surgical:  [name]  has a past surgical history that includes Abdominal surgery; Abdominal hysterectomy; Esophagogastroduodenoscopy (egd) with propofol (N/A, 02/20/2017); Colonoscopy with propofol (N/A, 02/20/2017); Upper esophageal endoscopic ultrasound (eus) (N/A, 04/03/2017); Diverticulitis; and EUS (N/A, 01/08/2018). Family: family history includes Diabetes in her mother; Hypertension in her mother.  Constitutional Exam  General appearance: Well nourished, well developed, and well hydrated. In no apparent acute distress Vitals:   04/09/18 0936  BP: 102/86  Pulse: (!) 106  Temp: 98.1 F (36.7 C)  SpO2: 95%  Weight: 124 lb (56.2 kg)  Height: 4' 11" (1.499 m)  Psych/Mental status: Alert, oriented x 3 (person, place, & time)       Eyes: PERLA Respiratory: No evidence of acute respiratory distress  Cervical Spine Area Exam  Skin & Axial Inspection: No masses, redness, edema, swelling, or associated skin lesions Alignment: Symmetrical Functional ROM: Unrestricted ROM      Stability: No instability detected Muscle Tone/Strength: Functionally intact. No obvious neuro-muscular anomalies detected. Sensory (Neurological): Unimpaired Palpation: No palpable anomalies              Upper Extremity (UE) Exam    Side: Right upper extremity  Side: Left upper extremity  Skin & Extremity Inspection: Skin color, temperature, and hair growth are WNL. No peripheral edema or cyanosis. No masses, redness, swelling, asymmetry, or associated skin lesions. No contractures.  Skin & Extremity Inspection: Skin color, temperature, and hair growth are WNL. No peripheral edema or cyanosis. No masses, redness, swelling, asymmetry, or associated skin lesions. No contractures.  Functional ROM: Unrestricted ROM          Functional ROM: Unrestricted ROM          Muscle Tone/Strength: Functionally intact. No obvious neuro-muscular anomalies detected.  Muscle Tone/Strength: Functionally intact. No obvious neuro-muscular  anomalies detected.  Sensory (Neurological): Unimpaired          Sensory (Neurological): Unimpaired          Palpation: No palpable anomalies              Palpation: No palpable anomalies              Provocative Test(s):  Phalen's test: deferred Tinel's test: deferred Apley's scratch test (touch opposite shoulder):  Action 1 (Across chest): deferred Action 2 (Overhead): deferred Action 3 (LB reach): deferred   Provocative Test(s):  Phalen's test: deferred Tinel's test: deferred Apley's scratch test (touch opposite shoulder):  Action 1 (Across chest): deferred Action 2 (Overhead): deferred Action 3 (LB reach): deferred    Thoracic Spine Area Exam  Skin & Axial Inspection: No masses, redness, or swelling Alignment: Symmetrical Functional ROM: Unrestricted ROM Stability: No instability detected Muscle Tone/Strength: Functionally intact. No obvious neuro-muscular anomalies detected. Sensory (Neurological): Unimpaired Muscle strength & Tone: No palpable anomalies  Lumbar Spine Area Exam  Skin & Axial Inspection: No masses, redness, or swelling Alignment: Symmetrical Functional ROM: Unrestricted ROM       Stability: No instability detected Muscle Tone/Strength: Functionally intact. No obvious neuro-muscular anomalies detected. Sensory (Neurological): Unimpaired Palpation: No palpable anomalies       Provocative Tests: Hyperextension/rotation test: deferred today       Lumbar quadrant test (Kemp's test): deferred today       Lateral bending test: deferred today       Patrick's Maneuver: deferred today                   FABER* test: deferred today                   S-I anterior distraction/compression test: deferred today         S-I lateral compression test: deferred today         S-I Thigh-thrust test: deferred today         S-I Gaenslen's test: deferred today         *(Flexion, ABduction and External Rotation)  Gait & Posture Assessment  Ambulation: Unassisted Gait:  Relatively normal for age and body habitus Posture: WNL   Lower Extremity Exam    Side: Right lower extremity  Side: Left lower extremity  Stability: No instability observed          Stability: No instability observed  Skin & Extremity Inspection: Skin color, temperature, and hair growth are WNL. No peripheral edema or cyanosis. No masses, redness, swelling, asymmetry, or associated skin lesions. No contractures.  Skin & Extremity Inspection: Skin color, temperature, and hair growth are WNL. No peripheral edema or cyanosis. No masses, redness, swelling, asymmetry, or associated skin lesions. No contractures.  Functional ROM: Unrestricted ROM                  Functional ROM: Unrestricted ROM                  Muscle Tone/Strength: Functionally intact. No obvious neuro-muscular anomalies detected.  Muscle Tone/Strength: Functionally intact. No obvious neuro-muscular anomalies detected.  Sensory (Neurological): Unimpaired        Sensory (Neurological): Unimpaired        DTR: Patellar: deferred today Achilles: deferred today Plantar: deferred today  DTR: Patellar: deferred today Achilles: deferred today Plantar: deferred today  Palpation: No palpable anomalies  Palpation: No palpable anomalies   Assessment  Primary Diagnosis & Pertinent Problem List: The primary encounter diagnosis was Lumbar radiculopathy. Diagnoses of DDD (degenerative disc disease), lumbar, Fibromyalgia, and Chronic pain syndrome were also pertinent to this visit.  Status Diagnosis  Controlled Controlled Controlled 1. Lumbar radiculopathy   2. DDD (degenerative disc disease), lumbar   3. Fibromyalgia   4. Chronic pain syndrome     Problems updated and reviewed during this visit: No problems updated. Plan of Care  Pharmacotherapy (Medications Ordered): Meds ordered this encounter  Medications  . HYDROcodone-acetaminophen (NORCO/VICODIN) 5-325 MG tablet    Sig: Take 1 tablet by mouth 3 (three) times daily  as needed for up to 30 days.    Dispense:  90 tablet    Refill:  0    Do not add this medication to the electronic "Automatic Refill" notification system. Patient may have prescription filled one day early if pharmacy is closed on scheduled refill date.    Order Specific Question:   Supervising Provider    Answer:   Milinda Pointer [[id number]]   New Prescriptions   No medications on file   Medications administered today: [name] had no medications administered during this visit. Lab-work, procedure(s), and/or referral(s): No orders of the defined types were placed in this encounter.  Imaging and/or referral(s): None  Interventional therapies: Planned, scheduled, and/or pending:   Not at this time.   Provider-requested follow-up: Return in about 4 weeks (around 05/07/2018) for MedMgmt.  Future Appointments  Date Time Provider [place]  04/13/2018  8:00 AM [facility] PAT2 [facility] None  05/06/2018  8:45 AM Vevelyn Francois, NP [facility] None   Primary Care Physician: Sharyne Peach, MD Location: [facility] Out[facility] Management Facility Note by: Vevelyn Francois NP Date: 04/09/2018; Time: 12:15 PM  Pain Score Disclaimer: We use the NRS-11 scale. This is a self-reported, subjective measurement of pain severity with only modest accuracy. It is used primarily to identify changes within a particular patient. It must be understood that outpatient pain scales are significantly less accurate that those used for research, where they can be applied under ideal controlled circumstances with minimal exposure to variables. In reality, the score is likely to be a combination of pain intensity and pain affect, where pain affect describes the degree of emotional arousal or changes in action readiness caused by the sensory experience of pain. Factors such as social and work situation, setting, emotional state, anxiety levels, expectation, and prior pain experience may influence  pain  perception and show large inter-individual differences that may also be affected by time variables.  Patient instructions provided during this appointment: Patient Instructions  ____________________________________________________________________________________________  Medication Rules  Purpose: To inform patients, and their family members, of our rules and regulations.  Applies to: All patients receiving prescriptions (written or electronic).  Pharmacy of record: Pharmacy where electronic prescriptions will be sent. If written prescriptions are taken to a different pharmacy, please inform the nursing staff. The pharmacy listed in the electronic medical record should be the one where you would like electronic prescriptions to be sent.  Electronic prescriptions: In compliance with the [place] (STOP) Act of 2017 (Session Lanny Cramp [id number]), effective March 25, 2018, all controlled substances must be electronically prescribed. Calling prescriptions to the pharmacy will cease to exist.  Prescription refills: Only during scheduled appointments. Applies to all prescriptions.  NOTE: The following applies primarily to controlled substances (Opioid* Pain Medications).   Patient's responsibilities: 1. Pain Pills: Bring all pain pills to every appointment (except for procedure appointments). 2. Pill Bottles: Bring pills in original pharmacy bottle. Always bring the newest bottle. Bring bottle, even if empty. 3. Medication refills: You are responsible for knowing and keeping track of what medications you take and those you need refilled. The day before your appointment: write a list of all prescriptions that need to be refilled. The day of the appointment: give the list to the admitting nurse. Prescriptions will be written only during appointments. If you Cook a medication: it will not be "Called in", "Faxed", or "electronically sent". You will need  to get another appointment to get these prescribed. No early refills. Do not call asking to have your prescription filled early. 4. Prescription Accuracy: You are responsible for carefully inspecting your prescriptions before leaving our office. Have the discharge nurse carefully go over each prescription with you, before taking them home. Make sure that your name is accurately spelled, that your address is correct. Check the name and dose of your medication to make sure it is accurate. Check the number of pills, and the written instructions to make sure they are clear and accurate. Make sure that you are given enough medication to last until your next medication refill appointment. 5. Taking Medication: Take medication as prescribed. When it comes to controlled substances, taking less pills or less frequently than prescribed is permitted and encouraged. Never take more pills than instructed. Never take medication more frequently than prescribed.  6. Inform other Doctors: Always inform, all of your healthcare providers, of all the medications you take. 7. Pain Medication from other Providers: You are not allowed to accept any additional pain medication from any other Doctor or Healthcare provider. There are two exceptions to this rule. (see below) In the event that you require additional pain medication, you are responsible for notifying us, as stated below. 8. Medication Agreement: You are responsible for carefully reading and following our Medication Agreement. This must be signed before receiving any prescriptions from our practice. Safely store a copy of your signed Agreement. Violations to the Agreement will result in no further prescriptions. (Additional copies of our Medication Agreement are available upon request.) 9. Laws, Rules, & Regulations: All patients are expected to follow all Federal and [employer], [employer], Rules, [employer]. Ignorance of the Laws does not constitute a valid excuse.  The use of any illegal substances is prohibited. 10. Adopted CDC guidelines & recommendations: Target dosing levels will be at or below 60 MME/day. Use of  benzodiazepines** is not recommended.  Exceptions: There are only two exceptions to the rule of not receiving pain medications from other Healthcare Providers. 1. Exception #1 (Emergencies): In the event of an emergency (i.e.: accident requiring emergency care), you are allowed to receive additional pain medication. However, you are responsible for: As soon as you are able, call our office [phone], at any time of the day or night, and leave a message stating your name, the date and nature of the emergency, and the name and dose of the medication prescribed. In the event that your call is answered by a member of our staff, make sure to document and save the date, time, and the name of the person that took your information.  2. Exception #2 (Planned Surgery): In the event that you are scheduled by another doctor or dentist to have any type of surgery or procedure, you are allowed (for a period no longer than 30 days), to receive additional pain medication, for the acute post-op pain. However, in this case, you are responsible for picking up a copy of our "Post-op Pain Management for Surgeons" handout, and giving it to your surgeon or dentist. This document is available at our office, and does not require an appointment to obtain it. Simply go to our office during business hours (Monday-Thursday from 8:00 AM to 4:00 PM) (Friday 8:00 AM to 12:00 Noon) or if you have a scheduled appointment with [name], prior to your surgery, and ask for it by name. In addition, you will need to provide [name] with your name, name of your surgeon, type of surgery, and date of procedure or surgery.  *Opioid medications include: morphine, codeine, oxycodone, oxymorphone, hydrocodone, hydromorphone, meperidine, tramadol, tapentadol, buprenorphine, fentanyl,  methadone. **Benzodiazepine medications include: diazepam (Valium), alprazolam (Xanax), clonazepam (Klonopine), lorazepam (Ativan), clorazepate (Tranxene), chlordiazepoxide (Librium), estazolam (Prosom), oxazepam (Serax), temazepam (Restoril), triazolam (Halcion) (Last updated: 05/22/2017) ____________________________________________________________________________________________

## 2018-04-09 NOTE — Patient Instructions (Signed)
____________________________________________________________________________________________  Medication Rules  Purpose: To inform patients, and their family members, of our rules and regulations.  Applies to: All patients receiving prescriptions (written or electronic).  Pharmacy of record: Pharmacy where electronic prescriptions will be sent. If written prescriptions are taken to a different pharmacy, please inform the nursing staff. The pharmacy listed in the electronic medical record should be the one where you would like electronic prescriptions to be sent.  Electronic prescriptions: In compliance with the [place] Strengthen Opioid Misuse Prevention (STOP) Act of 2017 (Session Law 2017-74/H243), effective March 25, 2018, all controlled substances must be electronically prescribed. Calling prescriptions to the pharmacy will cease to exist.  Prescription refills: Only during scheduled appointments. Applies to all prescriptions.  NOTE: The following applies primarily to controlled substances (Opioid* Pain Medications).   Patient's responsibilities: 1. Pain Pills: Bring all pain pills to every appointment (except for procedure appointments). 2. Pill Bottles: Bring pills in original pharmacy bottle. Always bring the newest bottle. Bring bottle, even if empty. 3. Medication refills: You are responsible for knowing and keeping track of what medications you take and those you need refilled. The day before your appointment: write a list of all prescriptions that need to be refilled. The day of the appointment: give the list to the admitting nurse. Prescriptions will be written only during appointments. If you forget a medication: it will not be "Called in", "Faxed", or "electronically sent". You will need to get another appointment to get these prescribed. No early refills. Do not call asking to have your prescription filled early. 4. Prescription Accuracy: You are responsible for  carefully inspecting your prescriptions before leaving our office. Have the discharge nurse carefully go over each prescription with you, before taking them home. Make sure that your name is accurately spelled, that your address is correct. Check the name and dose of your medication to make sure it is accurate. Check the number of pills, and the written instructions to make sure they are clear and accurate. Make sure that you are given enough medication to last until your next medication refill appointment. 5. Taking Medication: Take medication as prescribed. When it comes to controlled substances, taking less pills or less frequently than prescribed is permitted and encouraged. Never take more pills than instructed. Never take medication more frequently than prescribed.  6. Inform other Doctors: Always inform, all of your healthcare providers, of all the medications you take. 7. Pain Medication from other Providers: You are not allowed to accept any additional pain medication from any other Doctor or Healthcare provider. There are two exceptions to this rule. (see below) In the event that you require additional pain medication, you are responsible for notifying us, as stated below. 8. Medication Agreement: You are responsible for carefully reading and following our Medication Agreement. This must be signed before receiving any prescriptions from our practice. Safely store a copy of your signed Agreement. Violations to the Agreement will result in no further prescriptions. (Additional copies of our Medication Agreement are available upon request.) 9. Laws, Rules, & Regulations: All patients are expected to follow all Federal and State Laws, Statutes, Rules, & Regulations. Ignorance of the Laws does not constitute a valid excuse. The use of any illegal substances is prohibited. 10. Adopted CDC guidelines & recommendations: Target dosing levels will be at or below 60 MME/day. Use of benzodiazepines** is not  recommended.  Exceptions: There are only two exceptions to the rule of not receiving pain medications from other Healthcare Providers. 1.   Exception #1 (Emergencies): In the event of an emergency (i.e.: accident requiring emergency care), you are allowed to receive additional pain medication. However, you are responsible for: As soon as you are able, call our office [phone], at any time of the day or night, and leave a message stating your name, the date and nature of the emergency, and the name and dose of the medication prescribed. In the event that your call is answered by a member of our staff, make sure to document and save the date, time, and the name of the person that took your information.  2. Exception #2 (Planned Surgery): In the event that you are scheduled by another doctor or dentist to have any type of surgery or procedure, you are allowed (for a period no longer than 30 days), to receive additional pain medication, for the acute post-op pain. However, in this case, you are responsible for picking up a copy of our "Post-op Pain Management for Surgeons" handout, and giving it to your surgeon or dentist. This document is available at our office, and does not require an appointment to obtain it. Simply go to our office during business hours (Monday-Thursday from 8:00 AM to 4:00 PM) (Friday 8:00 AM to 12:00 Noon) or if you have a scheduled appointment with us, prior to your surgery, and ask for it by name. In addition, you will need to provide us with your name, name of your surgeon, type of surgery, and date of procedure or surgery.  *Opioid medications include: morphine, codeine, oxycodone, oxymorphone, hydrocodone, hydromorphone, meperidine, tramadol, tapentadol, buprenorphine, fentanyl, methadone. **Benzodiazepine medications include: diazepam (Valium), alprazolam (Xanax), clonazepam (Klonopine), lorazepam (Ativan), clorazepate (Tranxene), chlordiazepoxide (Librium), estazolam (Prosom),  oxazepam (Serax), temazepam (Restoril), triazolam (Halcion) (Last updated: 05/22/2017) ____________________________________________________________________________________________    

## 2018-04-09 NOTE — Progress Notes (Signed)
Nursing Pain Medication Assessment:  Safety precautions to be maintained throughout the outpatient stay will include: orient to surroundings, keep bed in low position, maintain call bell within reach at all times, provide assistance with transfer out of bed and ambulation.  Medication Inspection Compliance: Pill count conducted under aseptic conditions, in front of the patient. Neither the pills nor the bottle was removed from the patient's sight at any time. Once count was completed pills were immediately returned to the patient in their original bottle.  Medication: Hydrocodone/APAP Pill/Patch Count: 39 of 90 pills remain Pill/Patch Appearance: Markings consistent with prescribed medication Bottle Appearance: Standard pharmacy container. Clearly labeled. Filled Date: 73 / 26 / 2019 Last Medication intake:  Today

## 2018-04-13 ENCOUNTER — Inpatient Hospital Stay: Admission: RE | Admit: 2018-04-13 | Payer: Medicaid Other | Source: Ambulatory Visit

## 2018-04-17 ENCOUNTER — Other Ambulatory Visit: Payer: Medicaid Other

## 2018-04-20 ENCOUNTER — Other Ambulatory Visit: Payer: Self-pay

## 2018-04-20 ENCOUNTER — Encounter
Admission: RE | Admit: 2018-04-20 | Discharge: 2018-04-20 | Disposition: A | Discharge status: Home / Self Care | Payer: Medicaid Other | Source: Ambulatory Visit | Attending: General Surgery | Admitting: General Surgery

## 2018-04-20 DIAGNOSIS — I10 Essential (primary) hypertension: Secondary | ICD-10-CM | POA: Insufficient documentation

## 2018-04-20 DIAGNOSIS — Z01818 Encounter for other preprocedural examination: Secondary | ICD-10-CM | POA: Insufficient documentation

## 2018-04-20 DIAGNOSIS — E118 Type 2 diabetes mellitus with unspecified complications: Secondary | ICD-10-CM | POA: Insufficient documentation

## 2018-04-20 LAB — CBC
HCT: 41.9 % (ref 36.0–46.0)
HEMOGLOBIN: 13 g/dL (ref 12.0–15.0)
MCH: 27.5 pg (ref 26.0–34.0)
MCHC: 31 g/dL (ref 30.0–36.0)
MCV: 88.8 fL (ref 80.0–100.0)
Platelets: 306 10*3/uL (ref 150–400)
RBC: 4.72 MIL/uL (ref 3.87–5.11)
RDW: 14.3 % (ref 11.5–15.5)
WBC: 10.6 10*3/uL — ABNORMAL HIGH (ref 4.0–10.5)
nRBC: 0 % (ref 0.0–0.2)

## 2018-04-20 LAB — COMPREHENSIVE METABOLIC PANEL
ALT: 26 U/L (ref 0–44)
AST: 18 U/L (ref 15–41)
Albumin: 4.7 g/dL (ref 3.5–5.0)
Alkaline Phosphatase: 76 U/L (ref 38–126)
Anion gap: 8 (ref 5–15)
BUN: 10 mg/dL (ref 6–20)
CO2: 22 mmol/L (ref 22–32)
Calcium: 9.4 mg/dL (ref 8.9–10.3)
Chloride: 110 mmol/L (ref 98–111)
Creatinine, Ser: 0.46 mg/dL (ref 0.44–1.00)
GFR calc Af Amer: >60 mL/min (ref >60)
GFR calc non Af Amer: >60 mL/min (ref >60)
Glucose, Bld: 79 mg/dL (ref 70–99)
Potassium: 3.8 mmol/L (ref 3.5–5.1)
Sodium: 140 mmol/L (ref 135–145)
Total Bilirubin: 0.5 mg/dL (ref 0.3–1.2)
Total Protein: 7.8 g/dL (ref 6.5–8.1)

## 2018-04-20 NOTE — Patient Instructions (Addendum)
  Your procedure is scheduled on: Monday April 27, 2018 Report to Same Day Surgery 2nd floor [facility] Entrance-take elevator on left to 2nd floor.  Check in with surgery information desk.) To find out your arrival time, call [phone] 1:00-3:00 PM on Friday April 24, 2018  Remember: Instructions that are not followed completely may result in serious medical risk, up to and including death, or upon the discretion of your surgeon and anesthesiologist your surgery may need to be rescheduled.    __x__ 1. Do not eat food (including mints, candies, chewing gum) after midnight the night before your procedure. You may drink water up to 2 hours before you are scheduled to arrive at the hospital for your procedure.  Do not drink anything within 2 hours of your scheduled arrival to the hospital.    __x__ 2. No Alcohol for 24 hours before or after surgery.   __x__ 3. No Smoking or e-cigarettes for 24 hours before surgery.  Do not use any chewable tobacco products for at least 6 hours before surgery.   __x__ 4. Notify your doctor if there is any change in your medical condition (cold, fever, infections).   __x__ 5. On the morning of surgery brush your teeth with toothpaste and water.  You may rinse your mouth with mouthwash if you wish.  Do not swallow any toothpaste or mouthwash.  Please read over the following fact sheets that you were given:   [facility] Preparing for Surgery and/or MRSA Information    __x__ Use CHG Soap or Sage wipes as directed on instruction sheet    Do not wear jewelry, make-up, hairpins, clips or nail polish on the day of surgery.  Do not wear lotions, powders, deodorant, or perfumes.   Do not shave below the face/neck 48 hours prior to surgery.   Do not bring valuables to the hospital.    [facility] is not responsible for any belongings or valuables.               Contacts, dentures or bridgework may not be worn into surgery.  For patients  discharged on the day of surgery, you will NOT be permitted to drive yourself home.  You must have a responsible adult with you for 24 hours after surgery.  __x__ On the morning of surgery, take these medicines with a small sip of water:  1. Dexilant  2. Lyrica  3. Crestor  4. Zofran if needed for nausea  5. Hydrocodone if needed for pain  __x__ Use inhalers (Symbicort and Albuterol) on the day of surgery and bring them with you to the hospital.  __x__ Stop Metformin 2 days before surgery (Last dose Friday April 24, 2018).    __x__ No INSULIN or LOSARTAN on the morning of surgery.   __x__ Follow recommendations from Cardiologist, Pulmonologist or PCP regarding stopping blood thinners such as Aspirin, Coumadin, Plavix, Eliquis, Effient, Pradaxa, and Pletal.  __x__ TODAY: Stop Anti-inflammatories such as Advil, Ibuprofen, Motrin, Aleve, Naproxen, Naprosyn, BC/Goodies powders or aspirin products. You may continue to take Tylenol and Celebrex.   __x__ TODAY: Stop supplements until after surgery. You may continue to take Vitamin D, Vitamin B, and multivitamin.

## 2018-04-26 MED ORDER — CEFAZOLIN SODIUM-DEXTROSE 2-4 GM/100ML-% IV SOLN
2.0000 g | INTRAVENOUS | Status: AC
  Administered 2018-04-27: 2 g via INTRAVENOUS

## 2018-04-27 ENCOUNTER — Ambulatory Visit: Payer: Medicaid Other | Admitting: Anesthesiology

## 2018-04-27 ENCOUNTER — Encounter: Payer: Self-pay | Admitting: *Deleted

## 2018-04-27 ENCOUNTER — Ambulatory Visit
Admission: RE | Admit: 2018-04-27 | Discharge: 2018-04-27 | Disposition: A | Discharge status: Home / Self Care | Payer: Medicaid Other | Attending: General Surgery | Admitting: General Surgery

## 2018-04-27 ENCOUNTER — Encounter
Admission: RE | Disposition: A | Discharge status: Home / Self Care | Payer: Self-pay | Source: Home / Self Care | Attending: General Surgery

## 2018-04-27 DIAGNOSIS — F17200 Nicotine dependence, unspecified, uncomplicated: Secondary | ICD-10-CM | POA: Insufficient documentation

## 2018-04-27 DIAGNOSIS — F329 Major depressive disorder, single episode, unspecified: Secondary | ICD-10-CM | POA: Diagnosis not present

## 2018-04-27 DIAGNOSIS — Z7982 Long term (current) use of aspirin: Secondary | ICD-10-CM | POA: Insufficient documentation

## 2018-04-27 DIAGNOSIS — E119 Type 2 diabetes mellitus without complications: Secondary | ICD-10-CM | POA: Diagnosis not present

## 2018-04-27 DIAGNOSIS — Z79899 Other long term (current) drug therapy: Secondary | ICD-10-CM | POA: Diagnosis not present

## 2018-04-27 DIAGNOSIS — Z7951 Long term (current) use of inhaled steroids: Secondary | ICD-10-CM | POA: Diagnosis not present

## 2018-04-27 DIAGNOSIS — I700 Atherosclerosis of aorta: Secondary | ICD-10-CM | POA: Insufficient documentation

## 2018-04-27 DIAGNOSIS — J449 Chronic obstructive pulmonary disease, unspecified: Secondary | ICD-10-CM | POA: Insufficient documentation

## 2018-04-27 DIAGNOSIS — K8020 Calculus of gallbladder without cholecystitis without obstruction: Secondary | ICD-10-CM | POA: Diagnosis present

## 2018-04-27 DIAGNOSIS — K811 Chronic cholecystitis: Secondary | ICD-10-CM | POA: Diagnosis not present

## 2018-04-27 DIAGNOSIS — K5909 Other constipation: Secondary | ICD-10-CM | POA: Diagnosis not present

## 2018-04-27 DIAGNOSIS — I10 Essential (primary) hypertension: Secondary | ICD-10-CM | POA: Insufficient documentation

## 2018-04-27 DIAGNOSIS — E782 Mixed hyperlipidemia: Secondary | ICD-10-CM | POA: Diagnosis not present

## 2018-04-27 DIAGNOSIS — Z791 Long term (current) use of non-steroidal anti-inflammatories (NSAID): Secondary | ICD-10-CM | POA: Insufficient documentation

## 2018-04-27 DIAGNOSIS — K219 Gastro-esophageal reflux disease without esophagitis: Secondary | ICD-10-CM | POA: Insufficient documentation

## 2018-04-27 DIAGNOSIS — Z794 Long term (current) use of insulin: Secondary | ICD-10-CM | POA: Insufficient documentation

## 2018-04-27 HISTORY — PX: CHOLECYSTECTOMY: SHX55

## 2018-04-27 LAB — URINE DRUG SCREEN, QUALITATIVE (ARMC ONLY)
Amphetamines, Ur Screen: NOT DETECTED
Barbiturates, Ur Screen: NOT DETECTED
Benzodiazepine, Ur Scrn: NOT DETECTED
Cannabinoid 50 Ng, Ur ~~LOC~~: NOT DETECTED
Cocaine Metabolite,Ur ~~LOC~~: NOT DETECTED
MDMA (Ecstasy)Ur Screen: NOT DETECTED
Methadone Scn, Ur: NOT DETECTED
Opiate, Ur Screen: POSITIVE — AB
Phencyclidine (PCP) Ur S: NOT DETECTED
Tricyclic, Ur Screen: NOT DETECTED

## 2018-04-27 LAB — GLUCOSE, CAPILLARY
GLUCOSE-CAPILLARY: 216 mg/dL — AB (ref 70–99)
GLUCOSE-CAPILLARY: 278 mg/dL — AB (ref 70–99)

## 2018-04-27 SURGERY — LAPAROSCOPIC CHOLECYSTECTOMY
Anesthesia: General

## 2018-04-27 MED ORDER — LACTATED RINGERS IV SOLN
INTRAVENOUS | Status: DC | PRN
  Administered 2018-04-27: 07:00:00 via INTRAVENOUS

## 2018-04-27 MED ORDER — HYDROCODONE-ACETAMINOPHEN 5-325 MG PO TABS
ORAL_TABLET | ORAL | Status: AC
  Administered 2018-04-27: 1 via ORAL
  Filled 2018-04-27: qty 1

## 2018-04-27 MED ORDER — HYDROCODONE-ACETAMINOPHEN 5-325 MG PO TABS
1.0000 | ORAL_TABLET | Freq: Once | ORAL | Status: AC
  Administered 2018-04-27: 1 via ORAL

## 2018-04-27 MED ORDER — MIDAZOLAM HCL 2 MG/2ML IJ SOLN
INTRAMUSCULAR | Status: AC
  Filled 2018-04-27: qty 2

## 2018-04-27 MED ORDER — PROPOFOL 10 MG/ML IV BOLUS
INTRAVENOUS | Status: AC
  Filled 2018-04-27: qty 20

## 2018-04-27 MED ORDER — EPHEDRINE SULFATE 50 MG/ML IJ SOLN
INTRAMUSCULAR | Status: DC | PRN
  Administered 2018-04-27: 10 mg via INTRAVENOUS

## 2018-04-27 MED ORDER — PROPOFOL 10 MG/ML IV BOLUS
INTRAVENOUS | Status: DC | PRN
  Administered 2018-04-27: 180 mg via INTRAVENOUS

## 2018-04-27 MED ORDER — SODIUM CHLORIDE 0.9 % IV SOLN
INTRAVENOUS | Status: DC
  Administered 2018-04-27: 07:00:00 via INTRAVENOUS

## 2018-04-27 MED ORDER — FENTANYL CITRATE (PF) 100 MCG/2ML IJ SOLN
INTRAMUSCULAR | Status: AC
  Filled 2018-04-27: qty 2

## 2018-04-27 MED ORDER — FENTANYL CITRATE (PF) 100 MCG/2ML IJ SOLN
INTRAMUSCULAR | Status: DC | PRN
  Administered 2018-04-27: 50 ug via INTRAVENOUS

## 2018-04-27 MED ORDER — ROCURONIUM BROMIDE 100 MG/10ML IV SOLN
INTRAVENOUS | Status: DC | PRN
  Administered 2018-04-27: 40 mg via INTRAVENOUS

## 2018-04-27 MED ORDER — CEFAZOLIN SODIUM-DEXTROSE 2-4 GM/100ML-% IV SOLN
INTRAVENOUS | Status: AC
  Filled 2018-04-27: qty 100

## 2018-04-27 MED ORDER — DEXAMETHASONE SODIUM PHOSPHATE 10 MG/ML IJ SOLN
INTRAMUSCULAR | Status: DC | PRN
  Administered 2018-04-27: 6 mg via INTRAVENOUS

## 2018-04-27 MED ORDER — FENTANYL CITRATE (PF) 100 MCG/2ML IJ SOLN: 25.0000 ug | INTRAMUSCULAR | Status: DC | PRN

## 2018-04-27 MED ORDER — MIDAZOLAM HCL 2 MG/2ML IJ SOLN
INTRAMUSCULAR | Status: DC | PRN
  Administered 2018-04-27: 2 mg via INTRAVENOUS

## 2018-04-27 MED ORDER — BUPIVACAINE-EPINEPHRINE (PF) 0.5% -1:200000 IJ SOLN
INTRAMUSCULAR | Status: DC | PRN
  Administered 2018-04-27: 30 mL

## 2018-04-27 MED ORDER — BUPIVACAINE-EPINEPHRINE (PF) 0.5% -1:200000 IJ SOLN
INTRAMUSCULAR | Status: AC
  Filled 2018-04-27: qty 30

## 2018-04-27 MED ORDER — ONDANSETRON HCL 4 MG/2ML IJ SOLN
INTRAMUSCULAR | Status: DC | PRN
  Administered 2018-04-27: 4 mg via INTRAVENOUS

## 2018-04-27 MED ORDER — SUGAMMADEX SODIUM 200 MG/2ML IV SOLN
INTRAVENOUS | Status: DC | PRN
  Administered 2018-04-27: 200 mg via INTRAVENOUS

## 2018-04-27 MED ORDER — PHENYLEPHRINE HCL 10 MG/ML IJ SOLN
INTRAMUSCULAR | Status: DC | PRN
  Administered 2018-04-27 (×2): 200 ug via INTRAVENOUS

## 2018-04-27 MED ORDER — ONDANSETRON HCL 4 MG/2ML IJ SOLN: 4.0000 mg | Freq: Once | INTRAMUSCULAR | Status: DC | PRN

## 2018-04-27 MED ORDER — LIDOCAINE HCL (CARDIAC) PF 100 MG/5ML IV SOSY
PREFILLED_SYRINGE | INTRAVENOUS | Status: DC | PRN
  Administered 2018-04-27: 100 mg via INTRAVENOUS

## 2018-04-27 SURGICAL SUPPLY — 44 items
"PENCIL ELECTRO HAND CTR " (MISCELLANEOUS) IMPLANT
ADH SKN CLS APL DERMABOND .7 (GAUZE/BANDAGES/DRESSINGS) ×1
APPLIER CLIP 5 13 M/L LIGAMAX5 (MISCELLANEOUS) ×2
APR CLP MED LRG 5 ANG JAW (MISCELLANEOUS) ×1
BAG SPEC RTRVL LRG 6X4 10 (ENDOMECHANICALS) ×1
BLADE SURG SZ11 CARB STEEL (BLADE) ×2 IMPLANT
CANISTER SUCT 1200ML W/VALVE (MISCELLANEOUS) ×2 IMPLANT
CATH CHOLANG 76X19 KUMAR (CATHETERS) ×2 IMPLANT
CHLORAPREP W/TINT 26ML (MISCELLANEOUS) ×2 IMPLANT
CLIP APPLIE 5 13 M/L LIGAMAX5 (MISCELLANEOUS) ×1 IMPLANT
COVER WAND RF STERILE (DRAPES) ×2 IMPLANT
DERMABOND ADVANCED (GAUZE/BANDAGES/DRESSINGS) ×1
DERMABOND ADVANCED .7 DNX12 (GAUZE/BANDAGES/DRESSINGS) ×1 IMPLANT
ELECT REM PT RETURN 9FT ADLT (ELECTROSURGICAL) ×2
ELECTRODE REM PT RTRN 9FT ADLT (ELECTROSURGICAL) ×1 IMPLANT
GLOVE BIO SURGEON STRL SZ 6.5 (GLOVE) ×2 IMPLANT
GOWN STRL REUS W/ TWL LRG LVL3 (GOWN DISPOSABLE) ×4 IMPLANT
GOWN STRL REUS W/TWL LRG LVL3 (GOWN DISPOSABLE) ×8
GRASPER SUT TROCAR 14GX15 (MISCELLANEOUS) ×1 IMPLANT
HEMOSTAT SURGICEL 2X3 (HEMOSTASIS) IMPLANT
IRRIGATION STRYKERFLOW (MISCELLANEOUS) ×1 IMPLANT
IRRIGATOR STRYKERFLOW (MISCELLANEOUS) ×2
IV NS 1000ML (IV SOLUTION) ×2
IV NS 1000ML BAXH (IV SOLUTION) ×1 IMPLANT
KIT TURNOVER KIT A (KITS) ×2 IMPLANT
L-HOOK LAP DISP 36CM (ELECTROSURGICAL) ×2
LABEL OR SOLS (LABEL) ×2 IMPLANT
LHOOK LAP DISP 36CM (ELECTROSURGICAL) IMPLANT
NDL HYPO 25X1 1.5 SAFETY (NEEDLE) ×1 IMPLANT
NDL INSUFFLATION 14GA 120MM (NEEDLE) ×1 IMPLANT
NEEDLE HYPO 25X1 1.5 SAFETY (NEEDLE) ×2 IMPLANT
NEEDLE INSUFFLATION 14GA 120MM (NEEDLE) ×2 IMPLANT
NS IRRIG 500ML POUR BTL (IV SOLUTION) ×2 IMPLANT
PACK LAP CHOLECYSTECTOMY (MISCELLANEOUS) ×2 IMPLANT
PENCIL ELECTRO HAND CTR (MISCELLANEOUS) ×1 IMPLANT
POUCH SPECIMEN RETRIEVAL 10MM (ENDOMECHANICALS) ×2 IMPLANT
SCISSORS METZENBAUM CVD 33 (INSTRUMENTS) ×2 IMPLANT
SET TUBE SMOKE EVAC HIGH FLOW (TUBING) ×2 IMPLANT
SLEEVE ENDOPATH XCEL 5M (ENDOMECHANICALS) ×4 IMPLANT
SUT MNCRL AB 4-0 PS2 18 (SUTURE) ×2 IMPLANT
SUT VIC AB 0 CT1 36 (SUTURE) IMPLANT
SUT VICRYL 0 AB UR-6 (SUTURE) ×2 IMPLANT
TROCAR XCEL NON-BLD 11X100MML (ENDOMECHANICALS) ×2 IMPLANT
TROCAR XCEL NON-BLD 5MMX100MML (ENDOMECHANICALS) ×2 IMPLANT

## 2018-04-27 NOTE — Discharge Instructions (Signed)
AMBULATORY SURGERY  DISCHARGE INSTRUCTIONS   1) The drugs that you were given will stay in your system until tomorrow so for the next 24 hours you should not:  A) Drive an automobile B) Make any legal decisions C) Drink any alcoholic beverage   2) You may resume regular meals tomorrow.  Today it is better to start with liquids and gradually work up to solid foods.  You may eat anything you prefer, but it is better to start with liquids, then soup and crackers, and gradually work up to solid foods.   3) Please notify your doctor immediately if you have any unusual bleeding, trouble breathing, redness and pain at the surgery site, drainage, fever, or pain not relieved by medication.    4) Additional Instructions:        Please contact your physician with any problems or Same Day Surgery at [phone], Monday through Friday 6 am to 4 pm, or [place] at [facility] number at [phone].   Diet: Resume home heart healthy regular diet.   Activity: No heavy lifting >20 pounds (children, pets, laundry, garbage) or strenuous activity until follow-up, but light activity and walking are encouraged. Do not drive or drink alcohol if taking narcotic pain medications.  Wound care: May shower with soapy water and pat dry (do not rub incisions), but no baths or submerging incision underwater until follow-up. (no swimming)   Medications: Resume all home medications. For mild to moderate pain: acetaminophen (Tylenol) or ibuprofen (if no kidney disease). Combining Tylenol with alcohol can substantially increase your risk of causing liver disease. Narcotic pain medications, if prescribed, can be used for severe pain, though may cause nausea, constipation, and drowsiness. Do not combine Tylenol and Norco within a 6 hour period as Norco contains Tylenol. If you do not need the narcotic pain medication, you do not need to fill the prescription.  CONTINUE USING THE HYDROCODONE-ACETAMINOPHEN  5-325MG  PILLS PICKED AT THE PHARMACY ON 04/22/2018.  Call office [phone]) at any time if any questions, worsening pain, fevers/chills, bleeding, drainage from incision site, or other concerns.

## 2018-04-27 NOTE — Interval H&P Note (Signed)
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 After consideration of risks, benefits and other options for treatment, the patient has consented to  Procedure(s): LAPAROSCOPIC CHOLECYSTECTOMY (N/A) as a surgical intervention .  The patient's history has been reviewed, patient examined, no change in status, stable for surgery.  I have reviewed the patient's chart and labs.  Questions were answered to the patient's satisfaction.     Carolan Shiver

## 2018-04-27 NOTE — Anesthesia Preprocedure Evaluation (Signed)
Anesthesia Evaluation  Patient identified by MRN, date of birth, ID band Patient awake    Reviewed: Allergy & Precautions, NPO status , Patient's Chart, lab work & pertinent test results  History of Anesthesia Complications Negative for: history of anesthetic complications  Airway Mallampati: II       Dental  (+) Poor Dentition, Missing, Chipped   Pulmonary asthma , neg sleep apnea, COPD,  COPD inhaler, Current Smoker,           Cardiovascular hypertension, Pt. on medications (-) Past MI and (-) CHF (-) dysrhythmias + Valvular Problems/Murmurs (murmur, no tx)      Neuro/Psych neg Seizures Depression    GI/Hepatic Neg liver ROS, GERD  Medicated and Poorly Controlled,  Endo/Other  diabetes, Type 2, Oral Hypoglycemic Agents  Renal/GU negative Renal ROS     Musculoskeletal   Abdominal   Peds  Hematology   Anesthesia Other Findings   Reproductive/Obstetrics                             Anesthesia Physical Anesthesia Plan  ASA: III  Anesthesia Plan: General   Post-op Pain Management:    Induction: Intravenous  PONV Risk Score and Plan: 2 and Dexamethasone and Ondansetron  Airway Management Planned: Oral ETT  Additional Equipment:   Intra-op Plan:   Post-operative Plan:   Informed Consent: I have reviewed the patients History and Physical, chart, labs and discussed the procedure including the risks, benefits and alternatives for the proposed anesthesia with the patient or authorized representative who has indicated his/her understanding and acceptance.       Plan Discussed with:   Anesthesia Plan Comments:         Anesthesia Quick Evaluation

## 2018-04-27 NOTE — Anesthesia Postprocedure Evaluation (Signed)
Anesthesia Post Note  Patient: [name]  Procedure(s) Performed: LAPAROSCOPIC CHOLECYSTECTOMY (N/A )  Patient location during evaluation: PACU Anesthesia Type: General Level of consciousness: awake and alert Pain management: pain level controlled Vital Signs Assessment: post-procedure vital signs reviewed and stable Respiratory status: spontaneous breathing and respiratory function stable Cardiovascular status: stable Anesthetic complications: no     Last Vitals:  Vitals:   04/27/18 1031 04/27/18 1040  BP: 130/86   Pulse: 99 100  Resp: 11 13  Temp:    SpO2: 94% 95%    Last Pain:  Vitals:   04/27/18 1027  TempSrc:   PainSc: 6                  KEPHART,WILLIAM K

## 2018-04-27 NOTE — Brief Op Note (Signed)
04/27/2018  10:04 AM  PATIENT:  [name]  58 y.o. 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

## 2018-04-27 NOTE — Anesthesia Post-op Follow-up Note (Signed)
Anesthesia QCDR form completed.        

## 2018-04-27 NOTE — Anesthesia Procedure Notes (Signed)
Procedure Name: Intubation Date/Time: 04/27/2018 7:36 AM Performed by: Danelle Berry, CRNA Pre-anesthesia Checklist: Patient identified, Emergency Drugs available, Suction available, Patient being monitored and Timeout performed Patient Re-evaluated:Patient Re-evaluated prior to induction Oxygen Delivery Method: Circle system utilized and Simple face mask Preoxygenation: Pre-oxygenation with 100% oxygen Induction Type: IV induction Ventilation: Mask ventilation without difficulty Laryngoscope Size: McGraph and 3 Grade View: Grade III Tube type: Oral Tube size: 7.0 mm Number of attempts: 3 Airway Equipment and Method: Stylet Placement Confirmation: ETT inserted through vocal cords under direct vision,  positive ETCO2 and breath sounds checked- equal and bilateral Secured at: 19 cm Tube secured with: Tape Dental Injury: Teeth and Oropharynx as per pre-operative assessment  Difficulty Due To: Difficulty was unanticipated and Difficult Airway- due to anterior larynx

## 2018-04-27 NOTE — Op Note (Signed)
Preoperative diagnosis: Symptomatic cholelithiasis.  Postoperative diagnosis: Symptomatic cholelithiasis.  Procedure: Laparoscopic Cholecystectomy.   Anesthesia: GETA   Surgeon: Dr. Hazle Quant  Wound Classification: Clean Contaminated  Indications: Patient is a 58 y.o. female developed right upper quadrant pain, nausea and vomiting and on workup was found to have cholelithiasis with a normal common duct. Laparoscopic cholecystectomy was elected.  Findings: -Extensive adhesions was found on the midline that needed to be taken down to be able to have adequate exposure of the right liver and gallbladder.  -Critical view of safety achieved -Cystic duct and artery identified, ligated and divided -Adequate hemostasis  Description of procedure: The patient was placed on the operating table in the supine position. General anesthesia was induced. A time-out was completed verifying correct patient, procedure, site, positioning, and implant(s) and/or special equipment prior to beginning this procedure. An orogastric tube was placed. The abdomen was prepped and draped in the usual sterile fashion.  A Veress needle was inserted on the left upper quadrant due to large scar on the midline and umbilical area. The abdomen was insufflated with carbon dioxide to a pressure of 15 mmHg. A 5 mm Optiview trocar was inserted on the left upper quadrant. 5 mm cameral inserted and a lot of adhesions were identified. Another 5 mm trocar was inserted on the epigastric area. Adhesions were taken down until the umbilical area was visualized. A 11 mm trocar was inserted under direct visualization. With sharp dissection, the midline adhesions were taken down. The stomach was taken down too, from an adhesion on the epigastric area. Once the adhesions were taken down the right costal trocar were inserted under direct visualization.  The table was placed in the reverse Trendelenburg position with the right side up.  Filmy  adhesions between the gallbladder and omentum, duodenum and transverse colon were lysed sharply. The dome of the gallbladder was grasped with an atraumatic grasper passed through the lateral port and retracted over the dome of the liver. The infundibulum was also grasped with an atraumatic grasper through the midclavicular port and retracted toward the right lower quadrant. This maneuver exposed Calot's triangle. The peritoneum overlying the gallbladder infundibulum was then incised and the cystic duct and cystic artery identified and circumferentially dissected. Critical view of safety reviewed before ligating any structure. The cystic duct and cystic artery were then doubly clipped and divided close to the gallbladder.  The gallbladder was then dissected from its peritoneal attachments by electrocautery. Hemostasis was checked and the gallbladder and contained stones were removed using an endoscopic retrieval bag placed through the umbilical port. The gallbladder was passed off the table as a specimen. The gallbladder fossa was copiously irrigated with saline and hemostasis was obtained. There was no evidence of bleeding from the gallbladder fossa or cystic artery or leakage of the bile from the cystic duct stump. Secondary trocars were removed under direct vision. No bleeding was noted. The laparoscope was withdrawn and the umbilical trocar removed. The abdomen was allowed to collapse. The fascia of the 1mm trocar sites was closed with figure-of-eight 0 vicryl sutures. The skin was closed with subcuticular sutures of 4-0 monocryl and topical skin adhesive. The orogastric tube was removed.  The patient tolerated the procedure well and was taken to the postanesthesia care unit in stable condition.   Specimen: Gallbladder  Complications: None  EBL: 10 mL

## 2018-04-27 NOTE — Transfer of Care (Signed)
Immediate Anesthesia Transfer of Care Note  Patient: [name]  Procedure(s) Performed: LAPAROSCOPIC CHOLECYSTECTOMY (N/A )  Patient Location: PACU  Anesthesia Type:General  Level of Consciousness: sedated  Airway & Oxygen Therapy: Patient Spontanous Breathing and Patient connected to face mask oxygen  Post-op Assessment: Report given to RN and Post -op Vital signs reviewed and stable  Post vital signs: Reviewed and stable  Last Vitals:  Vitals Value Taken Time  BP 147/79 04/27/2018  9:46 AM  Temp    Pulse 102 04/27/2018  9:48 AM  Resp 21 04/27/2018  9:48 AM  SpO2 99 % 04/27/2018  9:48 AM  Vitals shown include unvalidated device data.  Last Pain:  Vitals:   04/27/18 0613  TempSrc: Oral  PainSc: 7          Complications: No apparent anesthesia complications

## 2018-04-28 ENCOUNTER — Encounter: Payer: Self-pay | Admitting: General Surgery

## 2018-04-28 ENCOUNTER — Telehealth: Payer: Self-pay | Admitting: Nurse Practitioner

## 2018-04-28 LAB — SURGICAL PATHOLOGY

## 2018-04-28 NOTE — Telephone Encounter (Signed)
Spoke with Cordelia Pen at Dr. Althia Forts Diaz's office, informed her of our Medication Agreement. She told me that they do not usually give pain meds after the type of surgery that she had. Called patient and informed her of this.

## 2018-04-28 NOTE — Telephone Encounter (Signed)
Please check on this.

## 2018-04-28 NOTE — Telephone Encounter (Signed)
Dr. Carolan Shiver [phone] would not prescribe pain meds and told her to take what she already had and told her to take 2 meds. Liborio Nixon gave the nurse at surgery the paper concerning medication coverage for surgery. [place] asks that someone call and let surgeon know he can write extra meds for pain.

## 2018-04-29 ENCOUNTER — Encounter: Payer: Self-pay | Admitting: General Surgery

## 2018-05-06 ENCOUNTER — Encounter: Payer: Self-pay | Admitting: Nurse Practitioner

## 2018-05-06 ENCOUNTER — Ambulatory Visit: Payer: Medicaid Other | Attending: Nurse Practitioner | Admitting: Nurse Practitioner

## 2018-05-06 VITALS — BP 116/77 | HR 93 | Temp 97.9°F | Resp 16 | Ht 59.0 in | Wt 129.0 lb

## 2018-05-06 DIAGNOSIS — G8929 Other chronic pain: Secondary | ICD-10-CM | POA: Diagnosis present

## 2018-05-06 DIAGNOSIS — G894 Chronic pain syndrome: Secondary | ICD-10-CM | POA: Diagnosis not present

## 2018-05-06 DIAGNOSIS — M7918 Myalgia, other site: Secondary | ICD-10-CM | POA: Diagnosis not present

## 2018-05-06 DIAGNOSIS — M5136 Other intervertebral disc degeneration, lumbar region: Secondary | ICD-10-CM | POA: Insufficient documentation

## 2018-05-06 DIAGNOSIS — M5416 Radiculopathy, lumbar region: Secondary | ICD-10-CM | POA: Insufficient documentation

## 2018-05-06 MED ORDER — HYDROCODONE-ACETAMINOPHEN 5-325 MG PO TABS: 1.0000 | ORAL_TABLET | Freq: Three times a day (TID) | ORAL | 0 refills | Status: DC | PRN

## 2018-05-06 MED ORDER — PREGABALIN 25 MG PO CAPS: 25.0000 mg | ORAL_CAPSULE | Freq: Three times a day (TID) | ORAL | 1 refills | Status: DC

## 2018-05-06 NOTE — Patient Instructions (Signed)
____________________________________________________________________________________________  Medication Rules  Purpose: To inform patients, and their family members, of our rules and regulations.  Applies to: All patients receiving prescriptions (written or electronic).  Pharmacy of record: Pharmacy where electronic prescriptions will be sent. If written prescriptions are taken to a different pharmacy, please inform the nursing staff. The pharmacy listed in the electronic medical record should be the one where you would like electronic prescriptions to be sent.  Electronic prescriptions: In compliance with the [place] Strengthen Opioid Misuse Prevention (STOP) Act of 2017 (Session Law 2017-74/H243), effective March 25, 2018, all controlled substances must be electronically prescribed. Calling prescriptions to the pharmacy will cease to exist.  Prescription refills: Only during scheduled appointments. Applies to all prescriptions.  NOTE: The following applies primarily to controlled substances (Opioid* Pain Medications).   Patient's responsibilities: 1. Pain Pills: Bring all pain pills to every appointment (except for procedure appointments). 2. Pill Bottles: Bring pills in original pharmacy bottle. Always bring the newest bottle. Bring bottle, even if empty. 3. Medication refills: You are responsible for knowing and keeping track of what medications you take and those you need refilled. The day before your appointment: write a list of all prescriptions that need to be refilled. The day of the appointment: give the list to the admitting nurse. Prescriptions will be written only during appointments. No prescriptions will be written on procedure days. If you forget a medication: it will not be "Called in", "Faxed", or "electronically sent". You will need to get another appointment to get these prescribed. No early refills. Do not call asking to have your prescription filled  early. 4. Prescription Accuracy: You are responsible for carefully inspecting your prescriptions before leaving our office. Have the discharge nurse carefully go over each prescription with you, before taking them home. Make sure that your name is accurately spelled, that your address is correct. Check the name and dose of your medication to make sure it is accurate. Check the number of pills, and the written instructions to make sure they are clear and accurate. Make sure that you are given enough medication to last until your next medication refill appointment. 5. Taking Medication: Take medication as prescribed. When it comes to controlled substances, taking less pills or less frequently than prescribed is permitted and encouraged. Never take more pills than instructed. Never take medication more frequently than prescribed.  6. Inform other Doctors: Always inform, all of your healthcare providers, of all the medications you take. 7. Pain Medication from other Providers: You are not allowed to accept any additional pain medication from any other Doctor or Healthcare provider. There are two exceptions to this rule. (see below) In the event that you require additional pain medication, you are responsible for notifying us, as stated below. 8. Medication Agreement: You are responsible for carefully reading and following our Medication Agreement. This must be signed before receiving any prescriptions from our practice. Safely store a copy of your signed Agreement. Violations to the Agreement will result in no further prescriptions. (Additional copies of our Medication Agreement are available upon request.) 9. Laws, Rules, & Regulations: All patients are expected to follow all Federal and State Laws, Statutes, Rules, & Regulations. Ignorance of the Laws does not constitute a valid excuse. The use of any illegal substances is prohibited. 10. Adopted CDC guidelines & recommendations: Target dosing levels will be  at or below 60 MME/day. Use of benzodiazepines** is not recommended.  Exceptions: There are only two exceptions to the rule of not   receiving pain medications from other Healthcare Providers. 1. Exception #1 (Emergencies): In the event of an emergency (i.e.: accident requiring emergency care), you are allowed to receive additional pain medication. However, you are responsible for: As soon as you are able, call our office [phone], at any time of the day or night, and leave a message stating your name, the date and nature of the emergency, and the name and dose of the medication prescribed. In the event that your call is answered by a member of our staff, make sure to document and save the date, time, and the name of the person that took your information.  2. Exception #2 (Planned Surgery): In the event that you are scheduled by another doctor or dentist to have any type of surgery or procedure, you are allowed (for a period no longer than 30 days), to receive additional pain medication, for the acute post-op pain. However, in this case, you are responsible for picking up a copy of our "Post-op Pain Management for Surgeons" handout, and giving it to your surgeon or dentist. This document is available at our office, and does not require an appointment to obtain it. Simply go to our office during business hours (Monday-Thursday from 8:00 AM to 4:00 PM) (Friday 8:00 AM to 12:00 Noon) or if you have a scheduled appointment with us, prior to your surgery, and ask for it by name. In addition, you will need to provide us with your name, name of your surgeon, type of surgery, and date of procedure or surgery.  *Opioid medications include: morphine, codeine, oxycodone, oxymorphone, hydrocodone, hydromorphone, meperidine, tramadol, tapentadol, buprenorphine, fentanyl, methadone. **Benzodiazepine medications include: diazepam (Valium), alprazolam (Xanax), clonazepam (Klonopine), lorazepam (Ativan), clorazepate  (Tranxene), chlordiazepoxide (Librium), estazolam (Prosom), oxazepam (Serax), temazepam (Restoril), triazolam (Halcion) (Last updated: 05/22/2017) ____________________________________________________________________________________________    

## 2018-05-06 NOTE — Progress Notes (Signed)
Patient's Name: [name]  MRN: [id number]  Referring Provider: Sharyne Peach, MD  DOB: [birth year]  PCP: Sharyne Peach, MD  DOS: 05/06/2018  Note by: Dionisio David, NP  Service setting: Ambulatory outpatient  Specialty: Interventional Pain Management  Location: [facility] (AMB) Pain Management Facility    Patient type: Established   HPI  Reason for Visit: [name] is a 58 y.o. year old, female patient, who comes today with a chief complaint of Back Pain (lower bilateral ) Last Appointment: Her last appointment at our practice was on 04/28/2018. I last saw her on 04/28/2018.  Pain Assessment: Today, [name] describes the severity of the Chronic pain as a 6 /10. She indicates the location/referral of the pain to be Back Lower, Left, Right/into hips . Onset was: More than a month ago. The quality of pain is described as Aching, Discomfort, Constant. Temporal description, or timing of pain is: Constant. Possible modifying factors: medications. [name]'s  height is _0  (1.499 m) and weight is 129 lb (58.5 kg). Her oral temperature is 97.9 F (36.6 C). Her blood pressure is 116/77 and her pulse is 93. Her respiration is 16 and oxygen saturation is 98%.  She is SP laparoscopic cholecystectomy.  She admits that she was in increased pain however she is doing better.  She was not given any additional pain medication at the time of surgery.  She denies any additional concerns related to her lower back pain.  Controlled Substance Pharmacotherapy Assessment REMS (Risk Evaluation and Mitigation Strategy)  Analgesic:Hydrocodone 5 mg TID prn MME/day:79m/day PJanett Billow RN  05/06/2018  9:28 AM  Sign when Signing Visit Nursing Pain Medication Assessment:  Safety precautions to be maintained throughout the outpatient stay will include: orient to surroundings, keep bed in low position, maintain call bell within reach at all times, provide assistance with transfer out  of bed and ambulation.  Medication Inspection Compliance: Pill count conducted under aseptic conditions, in front of the patient. Neither the pills nor the bottle was removed from the patient's sight at any time. Once count was completed pills were immediately returned to the patient in their original bottle.  Medication: Hydrocodone/APAP Pill/Patch Count: 44 of 90 pills remain Pill/Patch Appearance: Markings consistent with prescribed medication Bottle Appearance: Standard pharmacy container. Clearly labeled. Filled Date: 01 / 25 / 2020 Last Medication intake:  Today   Pharmacokinetics: Liberation and absorption (onset of action): WNL Distribution (time to peak effect): WNL Metabolism and excretion (duration of action): WNL         Pharmacodynamics: Desired effects: Analgesia: Ms. CMichaelsonreports >50% benefit. Functional ability: Patient reports that medication allows her to accomplish basic ADLs Clinically meaningful improvement in function (CMIF): Sustained CMIF goals met Perceived effectiveness: Described as relatively effective, allowing for increase in activities of daily living (ADL) Undesirable effects: Side-effects or Adverse reactions: None reported Monitoring: [place] PMP: Online review of the past 131-montheriod conducted. Compliant with practice rules and regulations Last UDS on record: Summary  Date Value Ref Range Status  03/11/2018 FINAL  Final    Comment:    ==================================================================== TOXASSURE SELECT 13 (MW) ==================================================================== Test                             Result       Flag       Units Drug Present and Declared for Prescription Verification   Hydrocodone  521          EXPECTED   ng/mg creat   Hydromorphone                  123          EXPECTED   ng/mg creat   Dihydrocodeine                 148          EXPECTED   ng/mg creat   Norhydrocodone                  567          EXPECTED   ng/mg creat    Sources of hydrocodone include scheduled prescription    medications. Hydromorphone, dihydrocodeine and norhydrocodone are    expected metabolites of hydrocodone. Hydromorphone and    dihydrocodeine are also available as scheduled prescription    medications. ==================================================================== Test                      Result    Flag   Units      Ref Range   Creatinine              137              mg/dL      >=20 ==================================================================== Declared Medications:  The flagging and interpretation on this report are based on the  following declared medications.  Unexpected results may arise from  inaccuracies in the declared medications.  **Note: The testing scope of this panel includes these medications:  Hydrocodone (Norco)  Hydrocodone (Vicodin)  **Note: The testing scope of this panel does not include following  reported medications:  Acetaminophen (Norco)  Acetaminophen (Vicodin)  Albuterol  Aspirin (Aspirin 81)  Cetirizine (Zyrtec)  Fluticasone (Flonase)  Hyoscyamine (Anaspaz)  Hyoscyamine (Levsin)  Insulin  Ketorolac (Toradol)  Linaclotide (Linzess)  Lisinopril  Metformin  Mirtazapine (Remeron)  Ondansetron (Zofran)  Polyethylene Glycol (MiraLAX)  Pregabalin  Ranitidine (Zantac)  Rizatriptan (Maxalt)  Rosuvastatin (Crestor)  Sucralfate (Carafate)  Topical Diclofenac (Voltaren)  Topiramate (Topamax)  Trazodone (Desyrel) ==================================================================== For clinical consultation, please call [phone]. ====================================================================    UDS interpretation: Compliant          Medication Assessment Form: Reviewed. Patient indicates being compliant with therapy Treatment compliance: Compliant Risk Assessment Profile: Aberrant behavior: See initial evaluations. None  observed or detected today Comorbid factors increasing risk of overdose: See initial evaluation. No additional risks detected today Opioid risk tool (ORT):  Opioid Risk  04/09/2018  Alcohol 1  Illegal Drugs 0  Rx Drugs 0  Alcohol 3  Illegal Drugs 0  Rx Drugs 0  Age between 16-45 years  0  History of Preadolescent Sexual Abuse 0  Psychological Disease 0  ADD -  OCD -  Bipolar -  Depression 1  Opioid Risk Tool Scoring 5  Opioid Risk Interpretation Moderate Risk    ORT Scoring interpretation table:  Score <3 = Low Risk for SUD  Score between 4-7 = Moderate Risk for SUD  Score >8 = High Risk for Opioid Abuse   Risk of substance use disorder (SUD): Moderate  Risk Mitigation Strategies:  Patient Counseling: Covered Patient-Prescriber Agreement (PPA): Present and active  Notification to other healthcare providers: Done  Pharmacologic Plan: No change in therapy, at this time.             ROS  Constitutional: Denies any fever or chills Gastrointestinal:  No reported hemesis, hematochezia, vomiting, or acute GI distress Musculoskeletal: Denies any acute onset joint swelling, redness, loss of ROM, or weakness Neurological: No reported episodes of acute onset apraxia, aphasia, dysarthria, agnosia, amnesia, paralysis, loss of coordination, or loss of consciousness  Medication Review  ALIVE ONCE DAILY WOMENS, Dexlansoprazole, HYDROcodone-acetaminophen, Insulin Detemir, albuterol, aspirin, budesonide-formoterol, cetirizine, famotidine, glucose blood, hyoscyamine, linaclotide, lisinopril, losartan, meclizine, metFORMIN, mirtazapine, ondansetron, polyethylene glycol, pregabalin, rizatriptan, rosuvastatin, scopolamine, topiramate, and traZODone  History Review  Allergy: Ms. Havlik is allergic to tramadol; benadryl [diphenhydramine hcl]; bextra [valdecoxib]; invokana [canagliflozin]; and sulfamethoxazole-trimethoprim. Drug: Ms. Drawdy  reports previous drug use. Alcohol:  reports  current alcohol use of about 1.0 standard drinks of alcohol per week. Tobacco:  reports that she has been smoking cigarettes. She has been smoking about 0.50 packs per day. She has quit using smokeless tobacco. Social: Ms. Volkert  reports that she has been smoking cigarettes. She has been smoking about 0.50 packs per day. She has quit using smokeless tobacco. She reports current alcohol use of about 1.0 standard drinks of alcohol per week. She reports previous drug use. Medical:  has a past medical history of Asthma, Asthma, BPPV (benign paroxysmal positional vertigo), Cardiac murmur, unspecified (08/28/2016), Degenerative disc disease, lumbar, Depression, Diabetes mellitus, Diverticulitis (10/2016), Fatty liver, Fibromyalgia, Fibromyalgia, GERD (gastroesophageal reflux disease), Headache, History of degenerative disc disease, Hyperlipidemia, and Hypertension. Surgical: Ms. Melick  has a past surgical history that includes Abdominal surgery; Abdominal hysterectomy; Esophagogastroduodenoscopy (egd) with propofol (N/A, 02/20/2017); Colonoscopy with propofol (N/A, 02/20/2017); Upper esophageal endoscopic ultrasound (eus) (N/A, 04/03/2017); Diverticulitis; EUS (N/A, 01/08/2018); and Cholecystectomy (N/A, 04/27/2018). Family: family history includes Diabetes in her mother; Hypertension in her mother. Problem List: Ms. Sowder has Pain in both hands on their pertinent problem list.  Lab Review  Kidney Function Lab Results  Component Value Date   BUN 10 04/20/2018   CREATININE 0.46 04/20/2018   BCR 14 11/27/2017   GFRAA >60 04/20/2018   GFRNONAA >60 04/20/2018  Liver Function Lab Results  Component Value Date   AST 18 04/20/2018   ALT 26 04/20/2018   ALBUMIN 4.7 04/20/2018  Note: Above Lab results reviewed.  Imaging Review  NM Hepato W/EjeCT Fract CLINICAL DATA:  Right upper quadrant pain  EXAM: NUCLEAR MEDICINE HEPATOBILIARY IMAGING WITH GALLBLADDER EF  VIEWS: Anterior right upper  quadrant  RADIOPHARMACEUTICALS:  5.447 mCi Tc-72m Choletec IV  COMPARISON:  None.  FINDINGS: Liver uptake of radiotracer is unremarkable. There is prompt visualization of gallbladder and small bowel, indicating patency of the cystic and common bile ducts. Patient consumed 8 ounces of Ensure orally with calculation of the computer generated ejection fraction of radiotracer from the gallbladder. The patient did not experience clinical symptoms with the oral Ensure consumption. The computer generated ejection fraction of radiotracer from the gallbladder is normal at 71%, normal greater than 33% using the oral agent.  IMPRESSION: Study within normal limits.  Electronically Signed   By: WLowella GripIII M.D.   On: 04/01/2018 12:43 Note: Above imaging results reviewed.        Physical Exam  General appearance: Well nourished, well developed, and well hydrated. In no apparent acute distress Mental status: Alert, oriented x 3 (person, place, & time)       Respiratory: No evidence of acute respiratory distress Eyes: PERLA Vitals: BP 116/77 (BP Location: Right Arm, Patient Position: Sitting, Cuff Size: Normal)   Pulse 93   Temp 97.9 F (36.6 C) (Oral)   Resp 16  Ht _0  (1.499 m)   Wt 129 lb (58.5 kg)   LMP  (LMP Unknown) Comment: "many years"  SpO2 98%   BMI 26.05 kg/m  BMI: Estimated body mass index is 26.05 kg/m as calculated from the following:   Height as of this encounter: _1  (1.499 m).   Weight as of this encounter: 129 lb (58.5 kg). Ideal: Patient must be at least 60 in tall to calculate ideal body weight Lumbar Spine Area Exam  Skin & Axial Inspection: No masses, redness, or swelling Alignment: Symmetrical Functional ROM: Unrestricted ROM       Stability: No instability detected Muscle Tone/Strength: Functionally intact. No obvious neuro-muscular anomalies detected. Sensory (Neurological): Unimpaired Palpation: Tender         Assessment   Status  Diagnosis  Persistent Persistent Persistent 1. Lumbar radiculopathy   2. Chronic myofascial pain   3. DDD (degenerative disc disease), lumbar   4. Chronic pain syndrome      Updated Problems: Problem  Lumbar Radiculopathy  Chronic Bilateral Low Back Pain With Bilateral Sciatica  Chronic Pain Syndrome  Chronic, Continuous Use of Opioids  Chronic Myofascial Pain    Plan of Care  Medications: I have discontinued Glenna Durand. Raus's ketorolac. I am also having her start on HYDROcodone-acetaminophen. Additionally, I am having her maintain her lisinopril, aspirin, traZODone, albuterol, metFORMIN, polyethylene glycol, topiramate, rizatriptan, rosuvastatin, linaclotide, ACCU-CHEK GUIDE, LEVEMIR FLEXTOUCH, ondansetron, famotidine, Dexlansoprazole, meclizine, hyoscyamine, losartan, budesonide-formoterol, ALIVE ONCE DAILY WOMENS, cetirizine, mirtazapine, scopolamine, HYDROcodone-acetaminophen, and pregabalin.  Administered today: Glenna Durand. Vermeer had no medications administered during this visit.  Orders:  No orders of the defined types were placed in this encounter.  Interventional options: Planned follow-up:   Return in about 2 months (around 07/05/2018) for MedMgmt. Return Date: 07/01/2018    Note by: Dionisio David, NP Date: 05/06/2018; Time: 1:20 PM

## 2018-05-06 NOTE — Progress Notes (Signed)
Nursing Pain Medication Assessment:  Safety precautions to be maintained throughout the outpatient stay will include: orient to surroundings, keep bed in low position, maintain call bell within reach at all times, provide assistance with transfer out of bed and ambulation.  Medication Inspection Compliance: Pill count conducted under aseptic conditions, in front of the patient. Neither the pills nor the bottle was removed from the patient's sight at any time. Once count was completed pills were immediately returned to the patient in their original bottle.  Medication: Hydrocodone/APAP Pill/Patch Count: 44 of 90 pills remain Pill/Patch Appearance: Markings consistent with prescribed medication Bottle Appearance: Standard pharmacy container. Clearly labeled. Filled Date: 01 / 25 / 2020 Last Medication intake:  Today

## 2018-06-05 ENCOUNTER — Other Ambulatory Visit: Payer: Self-pay | Admitting: Gastroenterology

## 2018-06-05 DIAGNOSIS — K219 Gastro-esophageal reflux disease without esophagitis: Secondary | ICD-10-CM

## 2018-06-05 DIAGNOSIS — R6881 Early satiety: Secondary | ICD-10-CM

## 2018-06-05 DIAGNOSIS — R110 Nausea: Secondary | ICD-10-CM

## 2018-07-01 ENCOUNTER — Other Ambulatory Visit: Payer: Self-pay

## 2018-07-01 ENCOUNTER — Ambulatory Visit: Payer: Medicaid Other | Attending: Nurse Practitioner | Admitting: Nurse Practitioner

## 2018-07-01 DIAGNOSIS — G894 Chronic pain syndrome: Secondary | ICD-10-CM

## 2018-07-01 DIAGNOSIS — G8929 Other chronic pain: Secondary | ICD-10-CM

## 2018-07-01 DIAGNOSIS — M7918 Myalgia, other site: Secondary | ICD-10-CM

## 2018-07-01 MED ORDER — HYDROCODONE-ACETAMINOPHEN 5-325 MG PO TABS: 1.0000 | ORAL_TABLET | Freq: Three times a day (TID) | ORAL | 0 refills | Status: DC | PRN

## 2018-07-01 MED ORDER — HYDROCODONE-ACETAMINOPHEN 5-325 MG PO TABS: 1.0000 | ORAL_TABLET | Freq: Three times a day (TID) | ORAL | 0 refills | Status: AC | PRN

## 2018-07-01 MED ORDER — PREGABALIN 25 MG PO CAPS: 25.0000 mg | ORAL_CAPSULE | Freq: Three times a day (TID) | ORAL | 1 refills | Status: DC

## 2018-07-01 NOTE — Progress Notes (Signed)
Pain Management Encounter Note - Virtual Visit via Telephone Telehealth (real-time audio visits between healthcare provider and patient).  Patient's Phone No. & Preferred Pharmacy:  [phone] (home); There is no such number on file (mobile).; (Preferred) [phone]  [id number] [place]- [place], [place] - [id number] [facility] [address] [place] [id number] Phone: [phone] Fax: [phone]   Pre-screening note:  Our staff contacted [name] and offered her an "in person", "face-to-face" appointment versus a telephone encounter. She indicated preferring the telephone encounter, at this time.  Reason for Virtual Visit: COVID-19*  Social distancing based on CDC and AMA recommendations.   I contacted Erika MeyerJanice R Cook on 07/01/2018 at 8:45 AM by telephone and clearly identified myself as Thad Rangerrystal King, NP. I verified that I was speaking with the correct person using two identifiers (Name and date of birth: 01/11/1961).  Advanced Informed Consent I sought verbal advanced consent from [name] for telemedicine interactions and virtual visit. I informed Ms. Shoff of the security and privacy concerns, risks, and limitations associated with performing an evaluation and management service by telephone. I also informed Ms. Hardiman of the availability of "in person" appointments and I informed her of the possibility of a patient responsible charge related to this service. [name] expressed understanding and agreed to proceed.   Historic Elements   [name] is a 58 y.o. year old, female patient evaluated today after her last encounter by our practice on 05/06/2018. [name]  has a past medical history of Asthma, Asthma, BPPV (benign paroxysmal positional vertigo), Cardiac murmur, unspecified (08/28/2016), Degenerative disc disease, lumbar, Depression, Diabetes mellitus, Diverticulitis (10/2016), Fatty liver, Fibromyalgia,  Fibromyalgia, GERD (gastroesophageal reflux disease), Headache, History of degenerative disc disease, Hyperlipidemia, and Hypertension. She also  has a past surgical history that includes Abdominal surgery; Abdominal hysterectomy; Esophagogastroduodenoscopy (egd) with propofol (N/A, 02/20/2017); Colonoscopy with propofol (N/A, 02/20/2017); Upper esophageal endoscopic ultrasound (eus) (N/A, 04/03/2017); Diverticulitis; EUS (N/A, 01/08/2018); and Cholecystectomy (N/A, 04/27/2018). [name] has a current medication list which includes the following prescription(s): accu-chek guide, albuterol, aspirin, budesonide-formoterol, cetirizine, dexlansoprazole, famotidine, hydrocodone-acetaminophen, hydrocodone-acetaminophen, hyoscyamine, levemir flextouch, linaclotide, lisinopril, losartan, meclizine, metformin, mirtazapine, alive once daily womens, ondansetron, polyethylene glycol, pregabalin, rizatriptan, rosuvastatin, scopolamine, topiramate, and trazodone. She  reports that she has been smoking cigarettes. She has been smoking about 0.50 packs per day. She has quit using smokeless tobacco. She reports current alcohol use of about 1.0 standard drinks of alcohol per week. She reports previous drug use. [name] is allergic to tramadol; benadryl [diphenhydramine hcl]; bextra [valdecoxib]; invokana [canagliflozin]; and sulfamethoxazole-trimethoprim.   HPI  I last saw her on 05/06/2018. She is being evaluated for medication management. She is having   7/10 low back pain.  She has pain that goes into her thighs. She is  having tingling. She is not having weakness.  She feels like the pain is getting worse because she is not active. She denies any flare up of her pain. When she sits for long periods does this the pain gets worse.  She is not interested in having injections. She denies any side effects of the current medication. She denies any new concerns today.   Pharmacotherapy Assessment  Analgesic:Hydrocodone  5 mg TID prn MME/day:15mg /day  Monitoring: Pharmacotherapy: No side-effects or adverse reactions reported. [place] PMP: PDMP reviewed during this encounter.       Compliance: No problems identified. Plan: Refer to "POC".  Review of recent tests  NM Hepato  W/EjeCT Fract CLINICAL DATA:  Right upper quadrant pain  EXAM: NUCLEAR MEDICINE HEPATOBILIARY IMAGING WITH GALLBLADDER EF  VIEWS: Anterior right upper quadrant  RADIOPHARMACEUTICALS:  5.447 mCi Tc-27m  Choletec IV  COMPARISON:  None.  FINDINGS: Liver uptake of radiotracer is unremarkable. There is prompt visualization of gallbladder and small bowel, indicating patency of the cystic and common bile ducts. Patient consumed 8 ounces of Ensure orally with calculation of the computer generated ejection fraction of radiotracer from the gallbladder. The patient did not experience clinical symptoms with the oral Ensure consumption. The computer generated ejection fraction of radiotracer from the gallbladder is normal at 71%, normal greater than 33% using the oral agent.  IMPRESSION: Study within normal limits.  Electronically Signed   By: Bretta Bang III M.D.   On: 04/01/2018 12:43   Admission on 04/27/2018, Discharged on 04/27/2018  Component Date Value Ref Range Status  . Tricyclic, Ur Screen 04/27/2018 NONE DETECTED  NONE DETECTED Final  . Amphetamines, Ur Screen 04/27/2018 NONE DETECTED  NONE DETECTED Final  . MDMA (Ecstasy)Ur Screen 04/27/2018 NONE DETECTED  NONE DETECTED Final  . Cocaine Metabolite,Ur [place] 04/27/2018 NONE DETECTED  NONE DETECTED Final  . Opiate, Ur Screen 04/27/2018 POSITIVE* NONE DETECTED Final  . Phencyclidine (PCP) Ur S 04/27/2018 NONE DETECTED  NONE DETECTED Final  . Cannabinoid 50 Ng, Ur [place] 04/27/2018 NONE DETECTED  NONE DETECTED Final  . Barbiturates, Ur Screen 04/27/2018 NONE DETECTED  NONE DETECTED Final  . Benzodiazepine, Ur Scrn 04/27/2018 NONE DETECTED  NONE DETECTED Final  . Methadone  Scn, Ur 04/27/2018 NONE DETECTED  NONE DETECTED Final   Comment: (NOTE) Tricyclics + metabolites, urine    Cutoff 1000 ng/mL Amphetamines + metabolites, urine  Cutoff 1000 ng/mL MDMA (Ecstasy), urine              Cutoff 500 ng/mL Cocaine Metabolite, urine          Cutoff 300 ng/mL Opiate + metabolites, urine        Cutoff 300 ng/mL Phencyclidine (PCP), urine         Cutoff 25 ng/mL Cannabinoid, urine                 Cutoff 50 ng/mL Barbiturates + metabolites, urine  Cutoff 200 ng/mL Benzodiazepine, urine              Cutoff 200 ng/mL Methadone, urine                   Cutoff 300 ng/mL The urine drug screen provides only a preliminary, unconfirmed analytical test result and should not be used for non-medical purposes. Clinical consideration and professional judgment should be applied to any positive drug screen result due to possible interfering substances. A more specific alternate chemical method must be used in order to obtain a confirmed analytical result. Gas chromatography / mass spectrometry (GC/MS) is the preferred confirmat                          ory method. Performed at [facility], [address] [place], [place] [address]   . Glucose-Capillary 04/27/2018 216* 70 - 99 mg/dL Final  . SURGICAL PATHOLOGY 04/27/2018    Final                   Value:Surgical Pathology CASE: [id number] PATIENT: Fidela Salisbury Surgical Pathology Report     SPECIMEN SUBMITTED: A. Gallbladder  CLINICAL HISTORY: None provided  PRE-OPERATIVE DIAGNOSIS: Cholelithiasis  without cholecystitis  POST-OPERATIVE DIAGNOSIS: Same as pre op     DIAGNOSIS: A.  GALLBLADDER; CHOLECYSTECTOMY: - CHRONIC CHOLECYSTITIS. - NEGATIVE FOR HIGH-GRADE DYSPLASIA AND MALIGNANCY.  GROSS DESCRIPTION: A. Labeled: Gallbladder Received: Formalin Size of specimen: 7.3 x 2.4 x 0.6 cm Previously opened: The cystic duct surgical resection margin is received opened; however, the remaining  external surface is intact. External surface: Tan-pink, smooth, and finely vascular Wall thickness: 0.2 cm (uniform) Mucosa: Tan-pink and velvety with no polyps or abnormalities grossly identified Stones present: No stones are present in the lumen or freely floating in the specimen container. Other findings: No cystic duct lymph node is identified.  Block                          summary: 1 - cystic duct surgical resection margin (inked blue, en face) and gallbladder wall   Final Diagnosis performed by Elijah Birk, MD.   Electronically signed 04/28/2018 1:13:26PM The electronic signature indicates that the named Attending Pathologist has evaluated the specimen  Technical component performed at [facility], [address], [place], [place] [address] Lab: [phone] Dir: Jolene Schimke, MD, MMM  Professional component performed at [facility], [facility], [address] [place], [place], [place] [address] Lab: [phone] Dir: Georgiann Cocker. Rubinas, MD   . Glucose-Capillary 04/27/2018 278* 70 - 99 mg/dL Final   Assessment  Diagnoses of Chronic pain syndrome and Chronic myofascial pain were pertinent to this visit.  Plan of Care  I have discontinued [name]. [place]'s pregabalin. I have also changed her pregabalin. Additionally, I am having her maintain her lisinopril, aspirin, traZODone, albuterol, metFORMIN, polyethylene glycol, topiramate, rizatriptan, rosuvastatin, linaclotide, Accu-Chek Guide, Levemir FlexTouch, ondansetron, famotidine, Dexlansoprazole, meclizine, hyoscyamine, losartan, budesonide-formoterol, Alive Once Daily Womens, cetirizine, mirtazapine, scopolamine, HYDROcodone-acetaminophen, and HYDROcodone-acetaminophen.  Pharmacotherapy (Medications Ordered): Meds ordered this encounter  Medications  . DISCONTD: HYDROcodone-acetaminophen (NORCO/VICODIN) 5-325 MG tablet    Sig: Take 1 tablet by mouth 3 (three) times daily as needed for up to 30 days.    Dispense:  90  tablet    Refill:  0    Do not add this medication to the electronic "Automatic Refill" notification system. Patient may have prescription filled one day early if pharmacy is closed on scheduled refill date.    Order Specific Question:   Supervising Provider    Answer:   Delano Metz [phone]  . DISCONTD: pregabalin (LYRICA) 25 MG capsule    Sig: Take 1 capsule (25 mg total) by mouth 3 (three) times daily.    Dispense:  90 capsule    Refill:  1    Order Specific Question:   Supervising Provider    Answer:   Delano Metz [phone]  . HYDROcodone-acetaminophen (NORCO/VICODIN) 5-325 MG tablet    Sig: Take 1 tablet by mouth every 8 (eight) hours as needed for up to 30 days for moderate pain.    Dispense:  90 tablet    Refill:  0    Do not place this medication, or any other prescription from our practice, on "Automatic Refill". Patient may have prescription filled one day early if pharmacy is closed on scheduled refill date.    Order Specific Question:   Supervising Provider    Answer:   Delano Metz [phone]  . pregabalin (LYRICA) 25 MG capsule    Sig: Take 1 capsule (25 mg total) by mouth 3 (three) times daily for 30 days.    Dispense:  90 capsule  Refill:  0    Order Specific Question:   Supervising Provider    Answer:   Delano Metz [phone]  . HYDROcodone-acetaminophen (NORCO/VICODIN) 5-325 MG tablet    Sig: Take 1 tablet by mouth 3 (three) times daily as needed for up to 30 days.    Dispense:  90 tablet    Refill:  0    Do not add this medication to the electronic "Automatic Refill" notification system. Patient may have prescription filled one day early if pharmacy is closed on scheduled refill date.    Order Specific Question:   Supervising Provider    Answer:   Delano Metz [phone]   Orders:  No orders of the defined types were placed in this encounter.  Follow-up plan:   Return in about 3 months (around 09/30/2018).   I discussed the assessment  and treatment plan with the patient. The patient was provided an opportunity to ask questions and all were answered. The patient agreed with the plan and demonstrated an understanding of the instructions.  Patient advised to call back or seek an in-person evaluation if the symptoms or condition worsens.  Total duration of non-face-to-face encounter: 11 minutes.  Note by: Thad Ranger, NP Date: 07/01/2018; Time: 8:59 AM  Disclaimer:  * Given the special circumstances of the COVID-19 pandemic, the federal government has announced that the Office for Civil Rights (OCR) will exercise its enforcement discretion and will not impose penalties on physicians using telehealth in the event of noncompliance with regulatory requirements under the [employer] Portability and Accountability Act (HIPAA) in connection with the good faith provision of telehealth during the COVID-19 national public health emergency. (AMA)

## 2018-07-02 MED ORDER — PREGABALIN 25 MG PO CAPS: 25.0000 mg | ORAL_CAPSULE | Freq: Three times a day (TID) | ORAL | 0 refills | Status: DC

## 2018-07-02 MED ORDER — HYDROCODONE-ACETAMINOPHEN 5-325 MG PO TABS: 1.0000 | ORAL_TABLET | Freq: Three times a day (TID) | ORAL | 0 refills | Status: DC | PRN

## 2018-07-26 IMAGING — MR MR LUMBAR SPINE W/O CM
4 of 5 series · 14 of 48 positions shown · non-contrast
Comparison: CT Abdomen and Pelvis 10/14/2016.

CLINICAL DATA: 56-year-old female with lumbar back pain radiating
to both legs, knees. Symptoms for 1 months with no known injury.

EXAM:
MRI LUMBAR SPINE WITHOUT CONTRAST
TECHNIQUE: Multiplanar, multisequence MR imaging of the lumbar spine was
performed. No intravenous contrast was administered.

[Series 2: T2 · sagittal · 4.0mm · 0.44mm/px · 5 of 15 slices shown (1 of 2)]
[im 1/15]
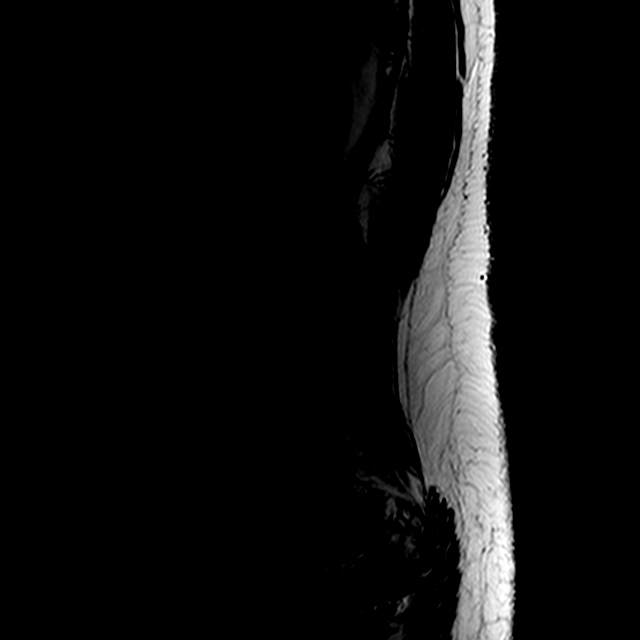
[im 3/15]
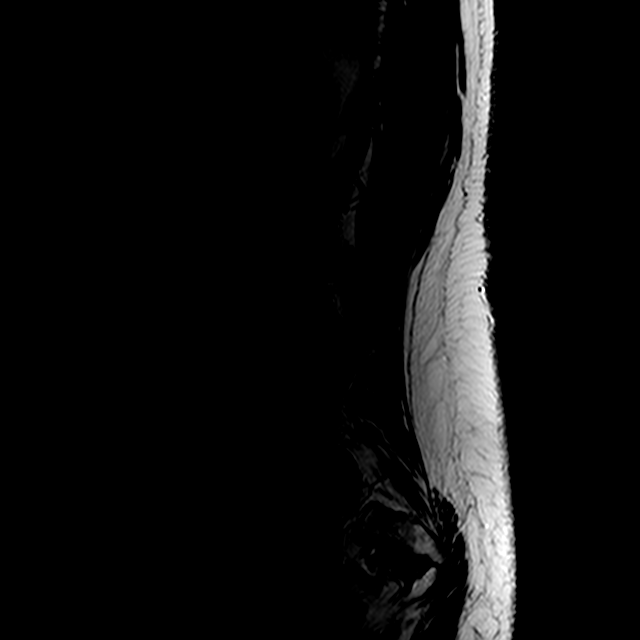
[im 6/15]
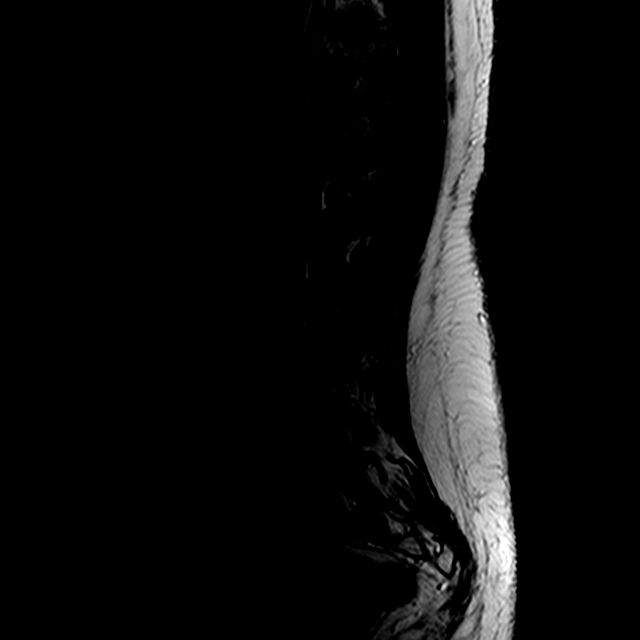
[im 9/15]
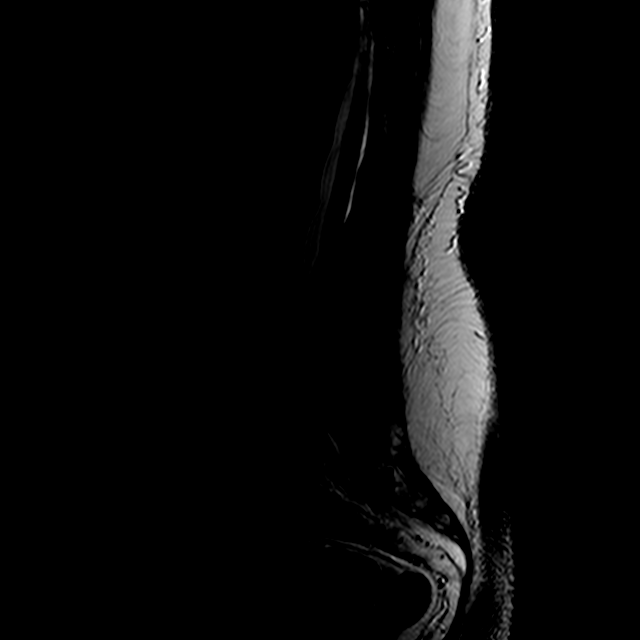
[im 15/15]
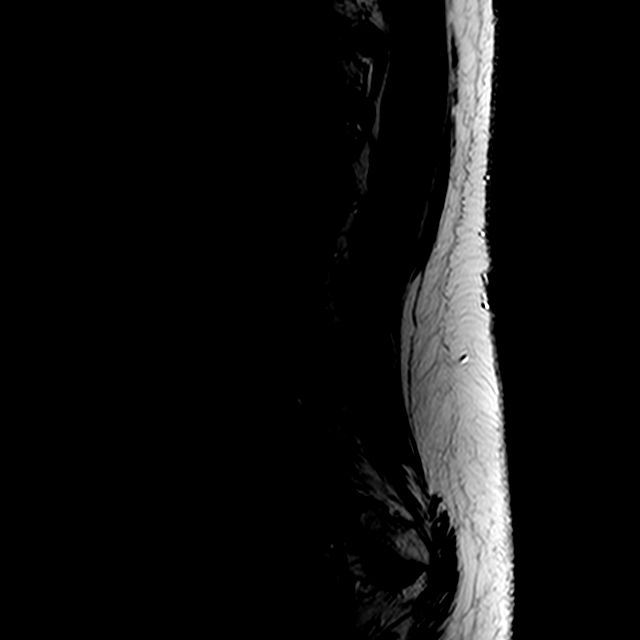

[Series 3: T1 · sagittal · 4.0mm · 0.44mm/px · 3 of 15 slices shown (1 of 2)]
[im 3/15]
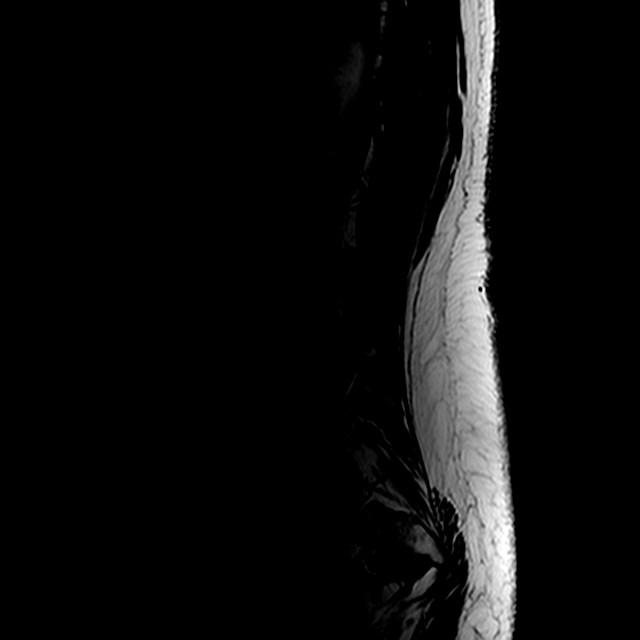
[im 9/15]
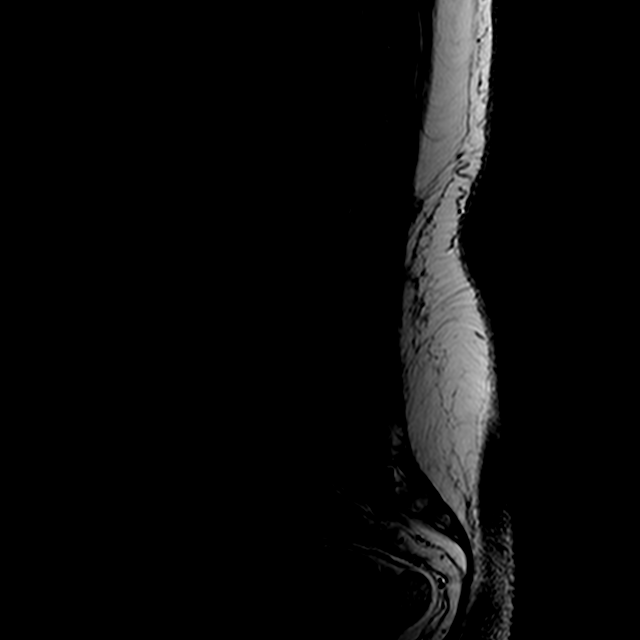
[im 15/15]
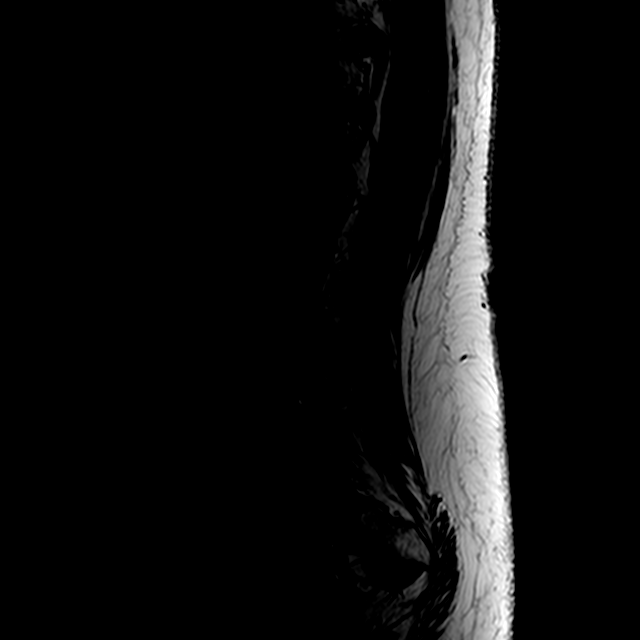

[Series 5: T2 · axial · 4.0mm · 0.39mm/px · z∈[-67,+78]mm · 3 of 37 slices shown (2 of 2)]
[im 6/37]
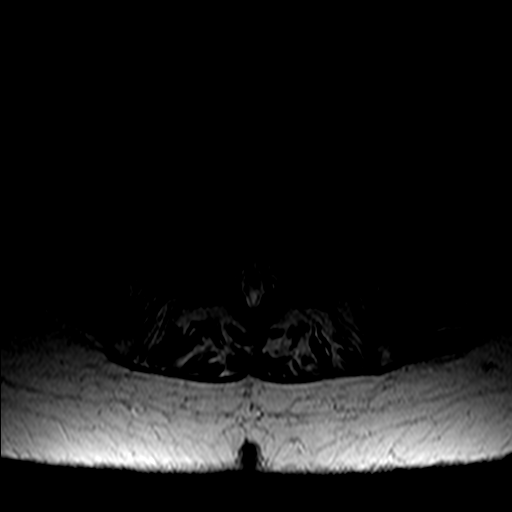
[im 19/37]
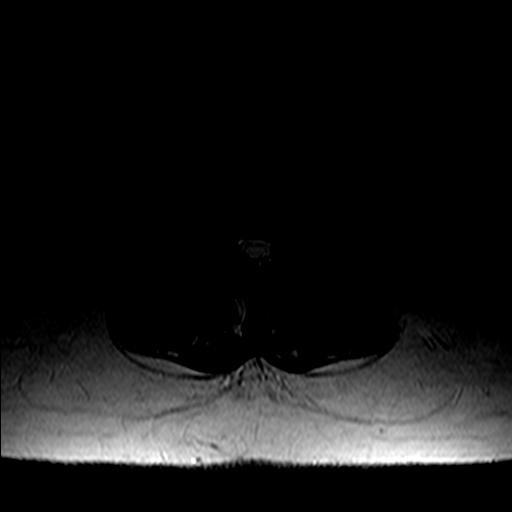
[im 31/37]
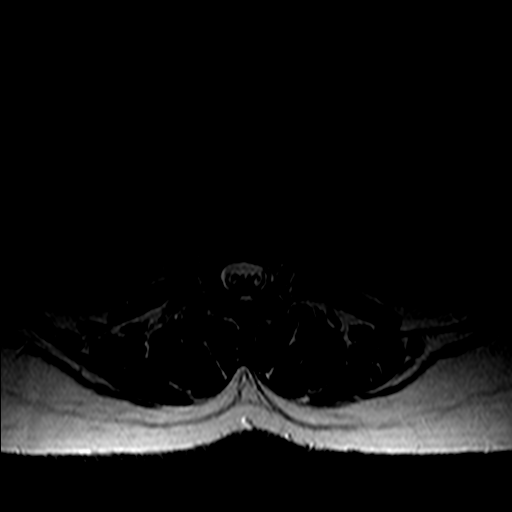

[Series 6: T1 · axial · 4.0mm · 0.39mm/px · z∈[-67,+78]mm · 3 of 37 slices shown (2 of 2)]
[im 6/37]
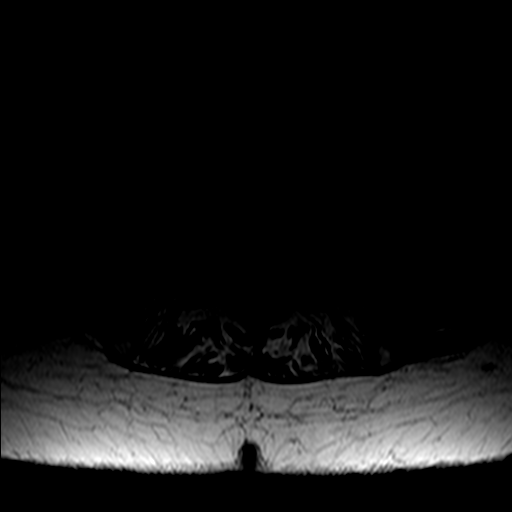
[im 19/37]
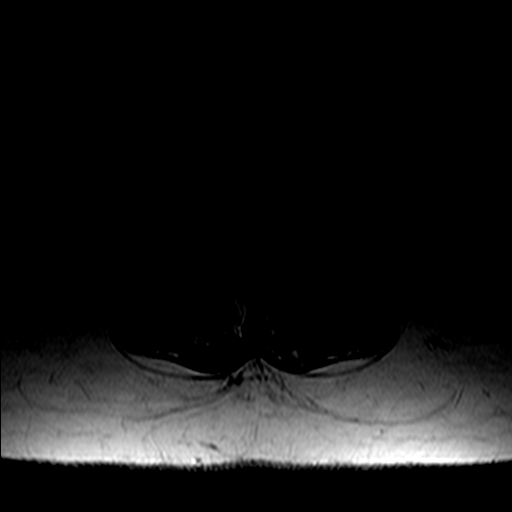
[im 31/37]
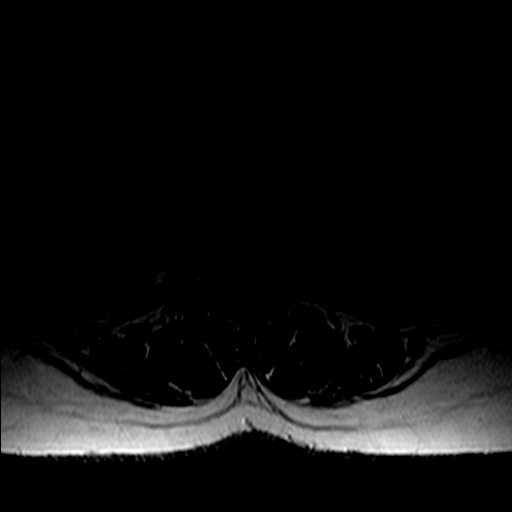

[14 of 48 positions shown; findings below may reference images not displayed]

FINDINGS: Segmentation:  Normal as demonstrated on the comparison CT.

Alignment: Stable vertebral height and alignment, within normal
limits.

Vertebrae: No marrow edema or evidence of acute osseous abnormality.
Visualized bone marrow signal is within normal limits. Negative
visible sacrum and SI joints.

Conus medullaris: Extends to the L1-L2. Level and appears normal.

Paraspinal and other soft tissues: Visible abdominal viscera today
and the paraspinal soft tissues are within normal limits.

Disc levels:

T11-T12: Mild circumferential disc bulge. Mild to moderate facet
hypertrophy greater on the left. Moderate bilateral T11 foraminal
stenosis.

T12-L1:  Minimal disc bulge.  No stenosis.

L1-L2: Mild left eccentric circumferential disc bulge. No stenosis.

L2-L3: Mild mostly far lateral disc bulging. Small central disc
extrusion (series 2, image 8). Mild facet hypertrophy. No stenosis.

L3-L4: Mild far lateral disc bulging. Mild facet and ligament flavum
hypertrophy. No stenosis.

L4-L5: Mild far lateral disc bulging. Mild facet and ligament flavum
hypertrophy. No stenosis.

L5-S1: Negative disc. Mild facet and ligament flavum hypertrophy.
Mild epidural lipomatosis. No stenosis.
IMPRESSION: 1. Mild lumbar disc and posterior element degeneration with no
spinal stenosis or neural impingement. There is a subtle central
disc extrusion at L2-L3.
2. Mild to moderate lower thoracic disc and posterior element
degeneration at T11-T12 with moderate bilateral T11 foraminal
stenosis.
3.  No osseous abnormality identified.

## 2018-08-13 ENCOUNTER — Other Ambulatory Visit: Payer: Self-pay | Admitting: Gastroenterology

## 2018-08-13 DIAGNOSIS — K5900 Constipation, unspecified: Secondary | ICD-10-CM

## 2018-08-13 DIAGNOSIS — R6881 Early satiety: Secondary | ICD-10-CM

## 2018-08-13 DIAGNOSIS — R140 Abdominal distension (gaseous): Secondary | ICD-10-CM

## 2018-08-14 ENCOUNTER — Telehealth: Payer: Self-pay | Admitting: *Deleted

## 2018-08-28 ENCOUNTER — Ambulatory Visit
Admission: RE | Admit: 2018-08-28 | Discharge: 2018-08-28 | Disposition: A | Discharge status: Home / Self Care | Payer: Medicaid Other | Source: Ambulatory Visit | Attending: Gastroenterology | Admitting: Gastroenterology

## 2018-08-28 ENCOUNTER — Other Ambulatory Visit: Payer: Self-pay

## 2018-08-28 DIAGNOSIS — R6881 Early satiety: Secondary | ICD-10-CM | POA: Diagnosis present

## 2018-08-28 DIAGNOSIS — K5900 Constipation, unspecified: Secondary | ICD-10-CM | POA: Insufficient documentation

## 2018-08-28 DIAGNOSIS — R140 Abdominal distension (gaseous): Secondary | ICD-10-CM | POA: Insufficient documentation

## 2018-08-28 MED ORDER — TECHNETIUM TC 99M SULFUR COLLOID
2.6100 | Freq: Once | INTRAVENOUS | Status: AC | PRN
  Administered 2018-08-28: 2.61 via INTRAVENOUS

## 2018-09-05 DIAGNOSIS — G4733 Obstructive sleep apnea (adult) (pediatric): Secondary | ICD-10-CM | POA: Insufficient documentation

## 2018-09-28 ENCOUNTER — Encounter: Payer: Self-pay | Admitting: Student in an Organized Health Care Education/Training Program

## 2018-09-29 ENCOUNTER — Encounter: Payer: Self-pay | Admitting: Student in an Organized Health Care Education/Training Program

## 2018-09-29 ENCOUNTER — Ambulatory Visit
Payer: Medicaid Other | Attending: Nurse Practitioner | Admitting: Student in an Organized Health Care Education/Training Program

## 2018-09-29 ENCOUNTER — Other Ambulatory Visit: Payer: Self-pay

## 2018-09-29 DIAGNOSIS — G894 Chronic pain syndrome: Secondary | ICD-10-CM | POA: Diagnosis not present

## 2018-09-29 DIAGNOSIS — M797 Fibromyalgia: Secondary | ICD-10-CM

## 2018-09-29 DIAGNOSIS — M51369 Other intervertebral disc degeneration, lumbar region without mention of lumbar back pain or lower extremity pain: Secondary | ICD-10-CM

## 2018-09-29 DIAGNOSIS — M79642 Pain in left hand: Secondary | ICD-10-CM

## 2018-09-29 DIAGNOSIS — M5416 Radiculopathy, lumbar region: Secondary | ICD-10-CM | POA: Diagnosis not present

## 2018-09-29 DIAGNOSIS — M7918 Myalgia, other site: Secondary | ICD-10-CM | POA: Diagnosis not present

## 2018-09-29 DIAGNOSIS — M79641 Pain in right hand: Secondary | ICD-10-CM

## 2018-09-29 DIAGNOSIS — M5136 Other intervertebral disc degeneration, lumbar region: Secondary | ICD-10-CM

## 2018-09-29 DIAGNOSIS — G8929 Other chronic pain: Secondary | ICD-10-CM

## 2018-09-29 MED ORDER — HYDROCODONE-ACETAMINOPHEN 5-325 MG PO TABS: 1.0000 | ORAL_TABLET | Freq: Three times a day (TID) | ORAL | 0 refills | Status: AC | PRN

## 2018-09-29 MED ORDER — PREGABALIN 50 MG PO CAPS: 50.0000 mg | ORAL_CAPSULE | Freq: Two times a day (BID) | ORAL | 2 refills | Status: DC

## 2018-09-29 MED ORDER — HYDROCODONE-ACETAMINOPHEN 5-325 MG PO TABS: 1.0000 | ORAL_TABLET | Freq: Three times a day (TID) | ORAL | 0 refills | Status: DC | PRN

## 2018-09-29 NOTE — Progress Notes (Signed)
Pain Management Virtual Encounter Note - Virtual Visit via Telephone Telehealth (real-time audio visits between healthcare provider and patient).   Patient's Phone No. & Preferred Pharmacy:  [phone] (home); There is no such number on file (mobile).; (Preferred) [phone] No e-mail address on record  [id number] [address]- [place], [place] - [address] [facility] [address] [place] [id number] Phone: [phone] Fax: [phone]    Pre-screening note:  Our staff contacted [name] and offered her an "in person", "face-to-face" appointment versus a telephone encounter. She indicated preferring the telephone encounter, at this time.   Reason for Virtual Visit: COVID-19*  Social distancing based on CDC and AMA recommendations.   I contacted [name] on 09/29/2018 via telephone.      I clearly identified myself as Edward JollyBilal Jovana Rembold, MD. I verified that I was speaking with the correct person using two identifiers (Name: Erika MiniumJanice R Trine, and date of birth: 1960/12/30).  Advanced Informed Consent I sought verbal advanced consent from [name] for virtual visit interactions. I informed [name] of possible security and privacy concerns, risks, and limitations associated with providing "not-in-person" medical evaluation and management services. I also informed [name] of the availability of "in-person" appointments. Finally, I informed her that there would be a charge for the virtual visit and that she could be  personally, fully or partially, financially responsible for it. [name] expressed understanding and agreed to proceed.   Historic Elements   [name] is a 58 y.o. year old, female patient evaluated today after her last encounter by our practice on 08/14/2018. [name]  has a past medical history of Asthma, Asthma, BPPV (benign paroxysmal positional vertigo), Cardiac murmur, unspecified  (08/28/2016), CPAP (continuous positive airway pressure) dependence, Degenerative disc disease, lumbar, Depression, Diabetes mellitus, Diverticulitis (10/2016), Fatty liver, Fibromyalgia, Fibromyalgia, GERD (gastroesophageal reflux disease), Headache, History of degenerative disc disease, Hyperlipidemia, and Hypertension. She also  has a past surgical history that includes Abdominal surgery; Abdominal hysterectomy; Esophagogastroduodenoscopy (egd) with propofol (N/A, 02/20/2017); Colonoscopy with propofol (N/A, 02/20/2017); Upper esophageal endoscopic ultrasound (eus) (N/A, 04/03/2017); Diverticulitis; EUS (N/A, 01/08/2018); and Cholecystectomy (N/A, 04/27/2018). [name] has a current medication list which includes the following prescription(s): accu-chek guide, albuterol, aspirin, budesonide-formoterol, cetirizine, dexlansoprazole, famotidine, hydrocodone-acetaminophen, hydrocodone-acetaminophen, hydrocodone-acetaminophen, hyoscyamine, levemir flextouch, linaclotide, lisinopril, losartan, meclizine, metformin, mirtazapine, alive once daily womens, ondansetron, polyethylene glycol, rizatriptan, rosuvastatin, scopolamine, topiramate, trazodone, and pregabalin. She  reports that she quit smoking about 16 months ago. Her smoking use included cigarettes. She smoked 0.50 packs per day. She has quit using smokeless tobacco. She reports current alcohol use of about 1.0 standard drinks of alcohol per week. She reports previous drug use. [name] is allergic to tramadol; benadryl [diphenhydramine hcl]; bextra [valdecoxib]; invokana [canagliflozin]; and sulfamethoxazole-trimethoprim.   HPI  Today, she is being contacted for medication management.  No change in medical history since last visit.  Patient's pain is at baseline.  Patient continues multimodal pain regimen as prescribed.  States that Lyrica is not very effective at his current dose of 25 mg 3 times daily.  Will increase to 50 mg twice  daily.   Pharmacotherapy Assessment  Analgesic:  09/15/2018  2   07/02/2018  Hydrocodone-Acetamin 5-325 MG  90.00 30 Cr Kin   [id number]   [facility] (0252)   0  15.00 MME  Medicaid   [place]    Monitoring: Pharmacotherapy: No side-effects or adverse reactions reported. [place] PMP: PDMP reviewed during this  encounter.       Compliance: No problems identified. Effectiveness: Clinically acceptable. Plan: Refer to "POC".  Pertinent Labs   SAFETY SCREENING Profile Lab Results  Component Value Date   HIV Non Reactive 10/15/2016   PREGTESTUR  05/11/2007    NEGATIVE        THE SENSITIVITY OF THIS METHODOLOGY IS >24 mIU/mL   Renal Function Lab Results  Component Value Date   BUN 10 04/20/2018   CREATININE 0.46 04/20/2018   BCR 14 11/27/2017   GFRAA >60 04/20/2018   GFRNONAA >60 04/20/2018   Hepatic Function Lab Results  Component Value Date   AST 18 04/20/2018   ALT 26 04/20/2018   ALBUMIN 4.7 04/20/2018   UDS Summary  Date Value Ref Range Status  03/11/2018 FINAL  Final    Comment:    ==================================================================== TOXASSURE SELECT 13 (MW) ==================================================================== Test                             Result       Flag       Units Drug Present and Declared for Prescription Verification   Hydrocodone                    521          EXPECTED   ng/mg creat   Hydromorphone                  123          EXPECTED   ng/mg creat   Dihydrocodeine                 148          EXPECTED   ng/mg creat   Norhydrocodone                 567          EXPECTED   ng/mg creat    Sources of hydrocodone include scheduled prescription    medications. Hydromorphone, dihydrocodeine and norhydrocodone are    expected metabolites of hydrocodone. Hydromorphone and    dihydrocodeine are also available as scheduled prescription    medications. ==================================================================== Test                       Result    Flag   Units      Ref Range   Creatinine              137              mg/dL      >=16>=20 ==================================================================== Declared Medications:  The flagging and interpretation on this report are based on the  following declared medications.  Unexpected results may arise from  inaccuracies in the declared medications.  **Note: The testing scope of this panel includes these medications:  Hydrocodone (Norco)  Hydrocodone (Vicodin)  **Note: The testing scope of this panel does not include following  reported medications:  Acetaminophen (Norco)  Acetaminophen (Vicodin)  Albuterol  Aspirin (Aspirin 81)  Cetirizine (Zyrtec)  Fluticasone (Flonase)  Hyoscyamine (Anaspaz)  Hyoscyamine (Levsin)  Insulin  Ketorolac (Toradol)  Linaclotide (Linzess)  Lisinopril  Metformin  Mirtazapine (Remeron)  Ondansetron (Zofran)  Polyethylene Glycol (MiraLAX)  Pregabalin  Ranitidine (Zantac)  Rizatriptan (Maxalt)  Rosuvastatin (Crestor)  Sucralfate (Carafate)  Topical Diclofenac (Voltaren)  Topiramate (Topamax)  Trazodone (Desyrel) ==================================================================== For clinical consultation, please call [phone]. ====================================================================  Note: Above Lab results reviewed.  Recent imaging  NM GASTRIC EMPTYING CLINICAL DATA:  Nausea and vomiting with early satiety  EXAM: NUCLEAR MEDICINE GASTRIC EMPTYING SCAN  TECHNIQUE: After oral ingestion of radiolabeled meal, sequential abdominal images were obtained for 4 hours. Percentage of activity emptying the stomach was calculated at 1 hour, 2 hours, and 3 hours.  RADIOPHARMACEUTICALS:  2.61 mCi Tc-14m sulfur colloid in standardized meal including egg  COMPARISON:  None.  FINDINGS: Expected location of the stomach in the left upper quadrant. Ingested meal empties the stomach gradually over the course of  the study.  60% emptied at 1 hr ( normal >= 10%)  89% emptied at 2 hr ( normal >= 40%)  95% emptied at 3 hr ( normal >= 70%)  Normal emptying of greater than 90% after 4 hours post radiotracer administration.  IMPRESSION: Normal gastric emptying study.  Electronically Signed   By: Lowella Grip III M.D.   On: 08/28/2018 16:12  Assessment  The primary encounter diagnosis was Chronic pain syndrome. Diagnoses of Chronic myofascial pain, Lumbar radiculopathy, DDD (degenerative disc disease), lumbar, Fibromyalgia, and Pain in both hands were also pertinent to this visit.  Plan of Care  I have discontinued Glenna Durand. Marut's pregabalin. I have also changed her HYDROcodone-acetaminophen. Additionally, I am having her start on pregabalin, HYDROcodone-acetaminophen, and HYDROcodone-acetaminophen. Lastly, I am having her maintain her lisinopril, aspirin, traZODone, albuterol, metFORMIN, polyethylene glycol, topiramate, rizatriptan, rosuvastatin, linaclotide, Accu-Chek Guide, Levemir FlexTouch, ondansetron, famotidine, Dexlansoprazole, meclizine, hyoscyamine, losartan, budesonide-formoterol, Alive Once Daily Womens, cetirizine, mirtazapine, and scopolamine.   Lyrica increased to 50 mg twice daily can consider increasing to 50 3 times daily at future visit Pharmacotherapy (Medications Ordered): Meds ordered this encounter  Medications  . HYDROcodone-acetaminophen (NORCO/VICODIN) 5-325 MG tablet    Sig: Take 1 tablet by mouth 3 (three) times daily as needed.    Dispense:  90 tablet    Refill:  0    Do not add this medication to the electronic "Automatic Refill" notification system. Patient may have prescription filled one day early if pharmacy is closed on scheduled refill date.  . pregabalin (LYRICA) 50 MG capsule    Sig: Take 1 capsule (50 mg total) by mouth 2 (two) times daily.    Dispense:  60 capsule    Refill:  2  . HYDROcodone-acetaminophen (NORCO/VICODIN) 5-325 MG tablet     Sig: Take 1 tablet by mouth 3 (three) times daily as needed.    Dispense:  90 tablet    Refill:  0    Do not add this medication to the electronic "Automatic Refill" notification system. Patient may have prescription filled one day early if pharmacy is closed on scheduled refill date.  Marland Kitchen HYDROcodone-acetaminophen (NORCO/VICODIN) 5-325 MG tablet    Sig: Take 1 tablet by mouth 3 (three) times daily as needed.    Dispense:  90 tablet    Refill:  0    Do not add this medication to the electronic "Automatic Refill" notification system. Patient may have prescription filled one day early if pharmacy is closed on scheduled refill date.   Orders:  No orders of the defined types were placed in this encounter.  Follow-up plan:   Return in about 3 months (around 12/30/2018) for Medication Management.    Recent Visits Date Type Provider Dept  07/01/18 Office Visit Vevelyn Francois, NP [facility]  Showing recent visits within past 90 days and meeting all other requirements   Today's Visits Date  Type Provider Dept  09/29/18 Office Visit Edward JollyLateef, Vikki Gains, MD [facility]  Showing today's visits and meeting all other requirements   Future Appointments No visits were found meeting these conditions.  Showing future appointments within next 90 days and meeting all other requirements   I discussed the assessment and treatment plan with the patient. The patient was provided an opportunity to ask questions and all were answered. The patient agreed with the plan and demonstrated an understanding of the instructions.  Patient advised to call back or seek an in-person evaluation if the symptoms or condition worsens.  Total duration of non-face-to-face encounter: 25 minutes.  Note by: Edward JollyBilal Jimesha Rising, MD Date: 09/29/2018; Time: 9:48 AM  Note: This dictation was prepared with Dragon dictation. Any transcriptional errors that may result from this process are unintentional.  Disclaimer:  *  Given the special circumstances of the COVID-19 pandemic, the federal government has announced that the Office for Civil Rights (OCR) will exercise its enforcement discretion and will not impose penalties on physicians using telehealth in the event of noncompliance with regulatory requirements under the [employer] Insurance Portability and Accountability Act (HIPAA) in connection with the good faith provision of telehealth during the COVID-19 national public health emergency. (AMA)

## 2018-09-30 ENCOUNTER — Other Ambulatory Visit: Payer: Self-pay | Admitting: Obstetrics and Gynecology

## 2018-09-30 DIAGNOSIS — Z1231 Encounter for screening mammogram for malignant neoplasm of breast: Secondary | ICD-10-CM

## 2018-10-15 ENCOUNTER — Other Ambulatory Visit: Payer: Self-pay | Admitting: Student in an Organized Health Care Education/Training Program

## 2018-10-15 ENCOUNTER — Telehealth: Payer: Self-pay | Admitting: Student in an Organized Health Care Education/Training Program

## 2018-10-15 DIAGNOSIS — G894 Chronic pain syndrome: Secondary | ICD-10-CM

## 2018-10-15 MED ORDER — HYDROCODONE-ACETAMINOPHEN 5-325 MG PO TABS: 1.0000 | ORAL_TABLET | Freq: Three times a day (TID) | ORAL | 0 refills | Status: AC | PRN

## 2018-10-15 NOTE — Telephone Encounter (Signed)
Pt called stating she needs a PA for her medication

## 2018-10-15 NOTE — Telephone Encounter (Signed)
Pharmacy is telling patient that Dr. Holley Raring is not licensed and DEA # is wrong? Please call Pharmacy and see what they need. Let patient know so she can pick up her meds

## 2018-10-15 NOTE — Telephone Encounter (Signed)
Called pt pharm. And he stated that he is having a hard time with her prescription going thru. All DEA and Lic were correct. Called patient to let her know the pharm was the ones having the problem with her prescriptions,. Left this on the answering machine.

## 2018-10-15 NOTE — Progress Notes (Signed)
Pharmacy states that they had an error receiving the patient's hydrocodone prescription dated to be filled today.  This is been verified by nursing staff.  Will resend prescription dated for 10/15/2018.  Requested Prescriptions   Signed Prescriptions Disp Refills  . HYDROcodone-acetaminophen (NORCO/VICODIN) 5-325 MG tablet 90 tablet 0    Sig: Take 1 tablet by mouth 3 (three) times daily as needed.

## 2018-12-28 ENCOUNTER — Encounter: Payer: Self-pay | Admitting: Student in an Organized Health Care Education/Training Program

## 2018-12-29 ENCOUNTER — Other Ambulatory Visit: Payer: Self-pay

## 2018-12-29 ENCOUNTER — Ambulatory Visit
Payer: Medicaid Other | Attending: Student in an Organized Health Care Education/Training Program | Admitting: Student in an Organized Health Care Education/Training Program

## 2018-12-29 ENCOUNTER — Encounter: Payer: Self-pay | Admitting: Student in an Organized Health Care Education/Training Program

## 2018-12-29 DIAGNOSIS — M79641 Pain in right hand: Secondary | ICD-10-CM

## 2018-12-29 DIAGNOSIS — M5136 Other intervertebral disc degeneration, lumbar region: Secondary | ICD-10-CM

## 2018-12-29 DIAGNOSIS — M79642 Pain in left hand: Secondary | ICD-10-CM

## 2018-12-29 DIAGNOSIS — G894 Chronic pain syndrome: Secondary | ICD-10-CM

## 2018-12-29 DIAGNOSIS — M5441 Lumbago with sciatica, right side: Secondary | ICD-10-CM

## 2018-12-29 DIAGNOSIS — Z79891 Long term (current) use of opiate analgesic: Secondary | ICD-10-CM

## 2018-12-29 DIAGNOSIS — M7918 Myalgia, other site: Secondary | ICD-10-CM

## 2018-12-29 DIAGNOSIS — M797 Fibromyalgia: Secondary | ICD-10-CM

## 2018-12-29 DIAGNOSIS — M5416 Radiculopathy, lumbar region: Secondary | ICD-10-CM

## 2018-12-29 DIAGNOSIS — G8929 Other chronic pain: Secondary | ICD-10-CM

## 2018-12-29 DIAGNOSIS — M5442 Lumbago with sciatica, left side: Secondary | ICD-10-CM

## 2018-12-29 MED ORDER — HYDROCODONE-ACETAMINOPHEN 5-325 MG PO TABS: 1.0000 | ORAL_TABLET | Freq: Three times a day (TID) | ORAL | 0 refills | Status: DC | PRN

## 2018-12-29 MED ORDER — NAPROXEN 500 MG PO TABS: 500.0000 mg | ORAL_TABLET | Freq: Two times a day (BID) | ORAL | 2 refills | Status: AC

## 2018-12-29 MED ORDER — HYDROCODONE-ACETAMINOPHEN 5-325 MG PO TABS: 1.0000 | ORAL_TABLET | Freq: Three times a day (TID) | ORAL | 0 refills | Status: AC | PRN

## 2018-12-29 MED ORDER — PREGABALIN 50 MG PO CAPS: 50.0000 mg | ORAL_CAPSULE | Freq: Two times a day (BID) | ORAL | 2 refills | Status: DC

## 2018-12-29 NOTE — Progress Notes (Signed)
Pain Management Virtual Encounter Note - Virtual Visit via Video Conference Telehealth (real-time audio visits between healthcare provider and patient).   Patient's Phone No. & Preferred Pharmacy:  [phone] (home); There is no such number on file (mobile).; (Preferred) [phone] No e-mail address on record  [id number] [place], [place] - [address] AT [facility] [address] [place] [address] Phone: [phone] Fax: [phone]    Pre-screening note:  Our staff contacted [name] and offered her an "in person", "face-to-face" appointment versus a telephone encounter. She indicated preferring the telephone encounter, at this time.   Reason for Virtual Visit: COVID-19*  Social distancing based on CDC and AMA recommendations.   I contacted [name] on 12/29/2018 via video conference.      I clearly identified myself as Edward JollyBilal Antoniette Peake, MD. I verified that I was speaking with the correct person using two identifiers (Name: [name], and date of birth: [birth year]).  Advanced Informed Consent I sought verbal advanced consent from [name] for virtual visit interactions. I informed [name] of possible security and privacy concerns, risks, and limitations associated with providing "not-in-person" medical evaluation and management services. I also informed [name] of the availability of "in-person" appointments. Finally, I informed her that there would be a charge for the virtual visit and that she could be  personally, fully or partially, financially responsible for it. [name] expressed understanding and agreed to proceed.   Historic Elements   [name] is a 58 y.o. year old, female patient evaluated today after her last encounter by our practice on 10/15/2018. [name]  has a past medical history of Asthma, Asthma, BPPV (benign paroxysmal positional vertigo), Cardiac murmur,  unspecified (08/28/2016), CPAP (continuous positive airway pressure) dependence, Degenerative disc disease, lumbar, Depression, Diabetes mellitus, Diverticulitis (10/2016), Fatty liver, Fibromyalgia, Fibromyalgia, GERD (gastroesophageal reflux disease), Headache, History of degenerative disc disease, Hyperlipidemia, and Hypertension. She also  has a past surgical history that includes Abdominal surgery; Abdominal hysterectomy; Esophagogastroduodenoscopy (egd) with propofol (N/A, 02/20/2017); Colonoscopy with propofol (N/A, 02/20/2017); Upper esophageal endoscopic ultrasound (eus) (N/A, 04/03/2017); Diverticulitis; EUS (N/A, 01/08/2018); and Cholecystectomy (N/A, 04/27/2018). [name] has a current medication list which includes the following prescription(s): accu-chek guide, albuterol, aspirin, budesonide-formoterol, dexlansoprazole, famotidine, fexofenadine, hydrocodone-acetaminophen, hydrocodone-acetaminophen, hydrocodone-acetaminophen, hyoscyamine, levemir flextouch, linaclotide, lisinopril, losartan, meclizine, metformin, mirtazapine, ondansetron, polyethylene glycol, pregabalin, rizatriptan, rosuvastatin, solifenacin, topiramate, cetirizine, losartan, alive once daily womens, naproxen, scopolamine, and trazodone. She  reports that she quit smoking about 19 months ago. Her smoking use included cigarettes. She smoked 0.50 packs per day. She has quit using smokeless tobacco. She reports current alcohol use of about 1.0 standard drinks of alcohol per week. She reports previous drug use. [name] is allergic to tramadol; benadryl [diphenhydramine hcl]; bextra [valdecoxib]; invokana [canagliflozin]; and sulfamethoxazole-trimethoprim.   HPI  Today, she is being contacted for medication management.   No change in medical history since last visit.  Patient's pain is at baseline although does have episodes of increased low back pain.  Discussed adding naproxen..  Patient continues multimodal pain regimen as  prescribed including Lyrica.  States that it provides pain relief and improvement in functional status.   Pharmacotherapy Assessment  Analgesic:  12/14/2018  2   09/29/2018  Hydrocodone-Acetamin 5-325 MG  90.00  30 Bi Lat   [address]   [facility] (0252)   0  15.00 MME  Medicaid   [place]  12/12/2018  2   12/01/2018  Pregabalin 50 MG Capsule  60.00  30 Bi Lat   98125   [facility] (0252)   0  0.67 LME  Medicaid   [place]     Monitoring: Pharmacotherapy: No side-effects or adverse reactions reported. [place] PMP: PDMP reviewed during this encounter.       Compliance: No problems identified. Effectiveness: Clinically acceptable.  Plan: Refer to "POC".  UDS:  Summary  Date Value Ref Range Status  03/11/2018 FINAL  Final    Comment:    ==================================================================== TOXASSURE SELECT 13 (MW) ==================================================================== Test                             Result       Flag       Units Drug Present and Declared for Prescription Verification   Hydrocodone                    521          EXPECTED   ng/mg creat   Hydromorphone                  123          EXPECTED   ng/mg creat   Dihydrocodeine                 148          EXPECTED   ng/mg creat   Norhydrocodone                 567          EXPECTED   ng/mg creat    Sources of hydrocodone include scheduled prescription    medications. Hydromorphone, dihydrocodeine and norhydrocodone are    expected metabolites of hydrocodone. Hydromorphone and    dihydrocodeine are also available as scheduled prescription    medications. ==================================================================== Test                      Result    Flag   Units      Ref Range   Creatinine              137              mg/dL      >=20 ==================================================================== Declared Medications:  The flagging and interpretation on this report are based on the  following declared  medications.  Unexpected results may arise from  inaccuracies in the declared medications.  **Note: The testing scope of this panel includes these medications:  Hydrocodone (Norco)  Hydrocodone (Vicodin)  **Note: The testing scope of this panel does not include following  reported medications:  Acetaminophen (Norco)  Acetaminophen (Vicodin)  Albuterol  Aspirin (Aspirin 81)  Cetirizine (Zyrtec)  Fluticasone (Flonase)  Hyoscyamine (Anaspaz)  Hyoscyamine (Levsin)  Insulin  Ketorolac (Toradol)  Linaclotide (Linzess)  Lisinopril  Metformin  Mirtazapine (Remeron)  Ondansetron (Zofran)  Polyethylene Glycol (MiraLAX)  Pregabalin  Ranitidine (Zantac)  Rizatriptan (Maxalt)  Rosuvastatin (Crestor)  Sucralfate (Carafate)  Topical Diclofenac (Voltaren)  Topiramate (Topamax)  Trazodone (Desyrel) ==================================================================== For clinical consultation, please call [phone]. ====================================================================    Laboratory Chemistry Profile (12 mo)  Renal: 04/20/2018: BUN 10; Creatinine, Ser 0.46  Lab Results  Component Value Date   GFRAA >60 04/20/2018   GFRNONAA >60 04/20/2018   Hepatic: 04/20/2018: Albumin 4.7 Lab Results  Component Value Date   AST 18 04/20/2018   ALT 26 04/20/2018  Other: No results found for requested labs within last 8760 hours. Note: Above Lab results reviewed.   Assessment  The primary encounter diagnosis was Chronic myofascial pain. Diagnoses of Chronic pain syndrome, Lumbar radiculopathy, DDD (degenerative disc disease), lumbar, Fibromyalgia, Pain in both hands, Long term prescription opiate use, and Chronic bilateral low back pain with bilateral sciatica were also pertinent to this visit.  Plan of Care  I am having [name]. Sane start on naproxen, HYDROcodone-acetaminophen, and HYDROcodone-acetaminophen. I am also having her maintain her lisinopril, aspirin,  traZODone, albuterol, metFORMIN, polyethylene glycol, topiramate, rizatriptan, rosuvastatin, linaclotide, Accu-Chek Guide, Levemir FlexTouch, ondansetron, famotidine, Dexlansoprazole, meclizine, hyoscyamine, losartan, budesonide-formoterol, Alive Once Daily Womens, cetirizine, mirtazapine, scopolamine, fexofenadine, losartan, solifenacin, pregabalin, and HYDROcodone-acetaminophen.  Pharmacotherapy (Medications Ordered): Meds ordered this encounter  Medications  . naproxen (NAPROSYN) 500 MG tablet    Sig: Take 1 tablet (500 mg total) by mouth 2 (two) times daily with a meal.    Dispense:  60 tablet    Refill:  2  . pregabalin (LYRICA) 50 MG capsule    Sig: Take 1 capsule (50 mg total) by mouth 2 (two) times daily.    Dispense:  60 capsule    Refill:  2  . HYDROcodone-acetaminophen (NORCO/VICODIN) 5-325 MG tablet    Sig: Take 1 tablet by mouth 3 (three) times daily as needed.    Dispense:  90 tablet    Refill:  0    Do not add this medication to the electronic "Automatic Refill" notification system. Patient may have prescription filled one day early if pharmacy is closed on scheduled refill date.  Marland Kitchen HYDROcodone-acetaminophen (NORCO/VICODIN) 5-325 MG tablet    Sig: Take 1 tablet by mouth 3 (three) times daily as needed.    Dispense:  90 tablet    Refill:  0    Do not add this medication to the electronic "Automatic Refill" notification system. Patient may have prescription filled one day early if pharmacy is closed on scheduled refill date.  Marland Kitchen HYDROcodone-acetaminophen (NORCO/VICODIN) 5-325 MG tablet    Sig: Take 1 tablet by mouth 3 (three) times daily as needed.    Dispense:  90 tablet    Refill:  0    Do not add this medication to the electronic "Automatic Refill" notification system. Patient may have prescription filled one day early if pharmacy is closed on scheduled refill date.   Orders:  Orders Placed This Encounter  Procedures  . ToxASSURE Select 13 (MW), Urine    Volume: 30  ml(s). Minimum 3 ml of urine is needed. Document temperature of fresh sample. Indications: Long term (current) use of opiate analgesic (G95.621)   Follow-up plan:   Return in about 3 months (around 04/10/2019) for Medication Management, in person.    Recent Visits No visits were found meeting these conditions.  Showing recent visits within past 90 days and meeting all other requirements   Today's Visits Date Type Provider Dept  12/29/18 Office Visit Edward Jolly, MD [facility]  Showing today's visits and meeting all other requirements   Future Appointments No visits were found meeting these conditions.  Showing future appointments within next 90 days and meeting all other requirements   I discussed the assessment and treatment plan with the patient. The patient was provided an opportunity to ask questions and all were answered. The patient agreed with the plan and demonstrated an understanding of the instructions.  Patient advised to call back or seek an in-person evaluation if the symptoms or  condition worsens.  Total duration of non-face-to-face encounter: 25 minutes.  Note by: Edward Jolly, MD Date: 12/29/2018; Time: 10:05 AM  Note: This dictation was prepared with Dragon dictation. Any transcriptional errors that may result from this process are unintentional.  Disclaimer:  * Given the special circumstances of the COVID-19 pandemic, the federal government has announced that the Office for Civil Rights (OCR) will exercise its enforcement discretion and will not impose penalties on physicians using telehealth in the event of noncompliance with regulatory requirements under the [employer] Portability and Accountability Act (HIPAA) in connection with the good faith provision of telehealth during the COVID-19 national public health emergency. (AMA)

## 2019-01-26 ENCOUNTER — Ambulatory Visit
Admission: RE | Admit: 2019-01-26 | Discharge: 2019-01-26 | Disposition: A | Discharge status: Home / Self Care | Payer: Medicaid Other | Source: Ambulatory Visit | Attending: Obstetrics and Gynecology | Admitting: Obstetrics and Gynecology

## 2019-01-26 DIAGNOSIS — Z1231 Encounter for screening mammogram for malignant neoplasm of breast: Secondary | ICD-10-CM | POA: Insufficient documentation

## 2019-02-27 LAB — TOXASSURE SELECT 13 (MW), URINE

## 2019-03-08 ENCOUNTER — Other Ambulatory Visit: Payer: Self-pay | Admitting: Student

## 2019-03-08 DIAGNOSIS — K760 Fatty (change of) liver, not elsewhere classified: Secondary | ICD-10-CM

## 2019-03-29 ENCOUNTER — Encounter: Payer: Self-pay | Admitting: Student in an Organized Health Care Education/Training Program

## 2019-03-30 ENCOUNTER — Ambulatory Visit
Payer: Medicaid Other | Attending: Student in an Organized Health Care Education/Training Program | Admitting: Student in an Organized Health Care Education/Training Program

## 2019-03-30 ENCOUNTER — Other Ambulatory Visit: Payer: Self-pay

## 2019-03-30 ENCOUNTER — Encounter: Payer: Self-pay | Admitting: Student in an Organized Health Care Education/Training Program

## 2019-03-30 DIAGNOSIS — F1190 Opioid use, unspecified, uncomplicated: Secondary | ICD-10-CM

## 2019-03-30 DIAGNOSIS — M5416 Radiculopathy, lumbar region: Secondary | ICD-10-CM | POA: Diagnosis not present

## 2019-03-30 DIAGNOSIS — M5136 Other intervertebral disc degeneration, lumbar region: Secondary | ICD-10-CM

## 2019-03-30 DIAGNOSIS — M7918 Myalgia, other site: Secondary | ICD-10-CM

## 2019-03-30 DIAGNOSIS — G894 Chronic pain syndrome: Secondary | ICD-10-CM | POA: Diagnosis not present

## 2019-03-30 DIAGNOSIS — Z79891 Long term (current) use of opiate analgesic: Secondary | ICD-10-CM

## 2019-03-30 DIAGNOSIS — M797 Fibromyalgia: Secondary | ICD-10-CM | POA: Diagnosis not present

## 2019-03-30 DIAGNOSIS — G8929 Other chronic pain: Secondary | ICD-10-CM

## 2019-03-30 MED ORDER — PREGABALIN 50 MG PO CAPS: ORAL_CAPSULE | ORAL | 1 refills | Status: DC

## 2019-03-30 MED ORDER — HYDROCODONE-ACETAMINOPHEN 5-325 MG PO TABS: 1.0000 | ORAL_TABLET | Freq: Three times a day (TID) | ORAL | 0 refills | Status: DC | PRN

## 2019-03-30 MED ORDER — HYDROCODONE-ACETAMINOPHEN 5-325 MG PO TABS: 1.0000 | ORAL_TABLET | Freq: Three times a day (TID) | ORAL | 0 refills | Status: AC | PRN

## 2019-03-30 NOTE — Progress Notes (Signed)
Virtual Encounter - Pain Management PROVIDER NOTE: Information contained herein reflects review and annotations entered in association with encounter. Interpretation of such information and data should be left to medically-trained personnel. Information provided to patient can be located elsewhere in the medical record under "Patient Instructions". Document created using STT-dictation technology, any transcriptional errors that may result from process are unintentional.    Contact & Pharmacy Preferred: [phone] Home: [phone] (home) Mobile: There is no such number on file (mobile). E-mail: No e-mail address on record  [place], [place] - [place] AT [facility] [place] [address] Phone: [phone] Fax: [phone]   Pre-screening  [name] offered "in-person" vs "virtual" encounter. She indicated preferring virtual for this encounter.   Reason COVID-19*  Social distancing based on CDC and AMA recommendations.   I contacted [name] on 03/30/2019 via video conference.      I clearly identified myself as Gillis Santa, MD. I verified that I was speaking with the correct person using two identifiers (Name: [name], and date of birth: 18-Jul-1960).  Pt location: home address (see Epic) Provider location: office   Consent I sought verbal advanced consent from [name] for virtual visit interactions. I informed [name] of possible security and privacy concerns, risks, and limitations associated with providing "not-in-person" medical evaluation and management services. I also informed [name] of the availability of "in-person" appointments. Finally, I informed her that there would be a charge for the virtual visit and that she could be  personally, fully or partially, financially responsible for it. [name] expressed understanding and agreed to proceed.   Historic Elements    [name] is a 59 y.o. year old, female patient evaluated today after her last encounter by our practice on 12/29/2018. [name]  has a past medical history of Asthma, Asthma, BPPV (benign paroxysmal positional vertigo), Cardiac murmur, unspecified (08/28/2016), CPAP (continuous positive airway pressure) dependence, Degenerative disc disease, lumbar, Depression, Diabetes mellitus, Diverticulitis (10/2016), Fatty liver, Fibromyalgia, Fibromyalgia, GERD (gastroesophageal reflux disease), Headache, History of degenerative disc disease, Hyperlipidemia, and Hypertension. She also  has a past surgical history that includes Abdominal surgery; Abdominal hysterectomy; Esophagogastroduodenoscopy (egd) with propofol (N/A, 02/20/2017); Colonoscopy with propofol (N/A, 02/20/2017); Upper esophageal endoscopic ultrasound (eus) (N/A, 04/03/2017); Diverticulitis; EUS (N/A, 01/08/2018); and Cholecystectomy (N/A, 04/27/2018). [name] has a current medication list which includes the following prescription(s): accu-chek guide, albuterol, aspirin, budesonide-formoterol, cetirizine, dexlansoprazole, estradiol, famotidine, fexofenadine, [START ON 04/12/2019] hydrocodone-acetaminophen, [START ON 05/12/2019] hydrocodone-acetaminophen, hyoscyamine, levemir flextouch, linaclotide, lisinopril, losartan, losartan, meclizine, metformin, mirtazapine, alive once daily womens, ondansetron, polyethylene glycol, pregabalin, rizatriptan, rosuvastatin, scopolamine, solifenacin, topiramate, and trazodone. She  reports that she quit smoking about 22 months ago. Her smoking use included cigarettes. She smoked 0.50 packs per day. She has quit using smokeless tobacco. She reports current alcohol use of about 1.0 standard drinks of alcohol per week. She reports previous drug use. [name] is allergic to tramadol; benadryl [diphenhydramine hcl]; bextra [valdecoxib]; invokana [canagliflozin]; and sulfamethoxazole-trimethoprim.    HPI  Today, she is being contacted for medication management.   No change in medical history since last visit.  Patient's pain is at baseline.  Patient continues multimodal pain regimen as prescribed.  States that it provides pain relief and improvement in functional status.  Patient finds hydrocodone beneficial for her low back pain.  She denies any constipation.  She feels that the Lyrica is not as helpful for  her fibromyalgia symptoms.  She is taking 50 mg twice a day.  She does not endorse any side effects.  We discussed increasing her dose from 100 mg daily to 150 mg by taking 50 mg during the day and 100 mg nightly.  Risks and benefits reviewed and patient would like to proceed.  Pharmacotherapy Assessment  Analgesic:  03/13/2019  2   12/29/2018  Hydrocodone-Acetamin 5-325 MG  90.00  30 Bi Lat   001749   [facility] (0252)   0  15.00 MME  Medicaid   [place]  02/15/2019  2   12/29/2018  Pregabalin 50 MG Capsule  60.00  30 Bi Lat   100734   [facility] (0252)   1  0.67 LME  Medicaid   [place]     Monitoring: Pharmacotherapy: No side-effects or adverse reactions reported. [place] PMP: PDMP reviewed during this encounter.       Compliance: No problems identified. Effectiveness: Clinically acceptable. Plan: Refer to "POC".  UDS:  Summary  Date Value Ref Range Status  02/24/2019 Note  Final    Comment:    ==================================================================== ToxASSURE Select 13 (MW) ==================================================================== Test                             Result       Flag       Units Drug Present and Declared for Prescription Verification   Hydrocodone                    990          EXPECTED   ng/mg creat   Hydromorphone                  183          EXPECTED   ng/mg creat   Dihydrocodeine                 210          EXPECTED   ng/mg creat   Norhydrocodone                 1496         EXPECTED   ng/mg creat    Sources of hydrocodone include scheduled  prescription medications.    Hydromorphone, dihydrocodeine and norhydrocodone are expected    metabolites of hydrocodone. Hydromorphone and dihydrocodeine are    also available as scheduled prescription medications. Drug Present not Declared for Prescription Verification   Alcohol, Ethyl                 0.072        UNEXPECTED g/dL    Sources of ethyl alcohol include alcoholic beverages or as a    fermentation product of glucose; glucose is present in this specimen.    Interpret result with caution, as the presence of ethyl alcohol is    likely due, at least in part, to fermentation of glucose. ==================================================================== Test                      Result    Flag   Units      Ref Range   Creatinine              71               mg/dL      >=44 ==================================================================== Declared Medications:  The flagging and interpretation on this  report are based on the  following declared medications.  Unexpected results may arise from  inaccuracies in the declared medications.  **Note: The testing scope of this panel includes these medications:  Hydrocodone  **Note: The testing scope of this panel does not include the  following reported medications:  Acetaminophen  Albuterol  Aspirin  Budesonide (Symbicort)  Cetirizine (Zyrtec)  Dexlansoprazole  Famotidine (Pepcid)  Fexofenadine (Allegra)  Formoterol (Symbicort)  Hyoscyamine  Insulin  Linaclotide (Linzess)  Lisinopril  Losartan (Cozaar)  Meclizine  Metformin  Mirtazapine (Remeron)  Multivitamin  Naproxen  Ondansetron  Polyethylene Glycol  Pregabalin  Rizatriptan (Maxalt)  Rosuvastatin (Crestor)  Solifenacin (Vesicare)  Topical  Topiramate  Trazodone ==================================================================== For clinical consultation, please call [phone]. ====================================================================     Laboratory Chemistry Profile (12 mo)  Renal: 04/20/2018: BUN 10; Creatinine, Ser 0.46  Lab Results  Component Value Date   GFRAA >60 04/20/2018   GFRNONAA >60 04/20/2018   Hepatic: 04/20/2018: Albumin 4.7 Lab Results  Component Value Date   AST 18 04/20/2018   ALT 26 04/20/2018   Other: No results found for requested labs within last 8760 hours. Note: Above Lab results reviewed.  Imaging  MM 3D SCREEN BREAST BILATERAL CLINICAL DATA:  Screening.  EXAM: DIGITAL SCREENING BILATERAL MAMMOGRAM WITH TOMO AND CAD  COMPARISON:  Previous exam(s).  ACR Breast Density Category c: The breast tissue is heterogeneously dense, which may obscure small masses.  FINDINGS: There are no findings suspicious for malignancy. Images were processed with CAD.  IMPRESSION: No mammographic evidence of malignancy. A result letter of this screening mammogram will be mailed directly to the patient.  RECOMMENDATION: Screening mammogram in one year. (Code:SM-B-01Y)  BI-RADS CATEGORY  1: Negative.  Electronically Signed   By: Edwin Cap M.D.   On: 01/26/2019 15:04   Assessment  The primary encounter diagnosis was Chronic pain syndrome. Diagnoses of Chronic myofascial pain, Lumbar radiculopathy, Fibromyalgia, DDD (degenerative disc disease), lumbar, Long term prescription opiate use, and Chronic, continuous use of opioids were also pertinent to this visit.  Plan of Care  I have changed [name]. [name] pregabalin. I am also having her start on HYDROcodone-acetaminophen. Additionally, I am having her maintain her lisinopril, aspirin, traZODone, albuterol, metFORMIN, polyethylene glycol, topiramate, rizatriptan, rosuvastatin, linaclotide, Accu-Chek Guide, Levemir FlexTouch, ondansetron, famotidine, Dexlansoprazole, meclizine, hyoscyamine, losartan, budesonide-formoterol, Alive Once Daily Womens, cetirizine, mirtazapine, scopolamine, fexofenadine, losartan, solifenacin, Estradiol, and  HYDROcodone-acetaminophen.  Pharmacotherapy (Medications Ordered): Meds ordered this encounter  Medications  . HYDROcodone-acetaminophen (NORCO/VICODIN) 5-325 MG tablet    Sig: Take 1 tablet by mouth 3 (three) times daily as needed.    Dispense:  90 tablet    Refill:  0    Do not add this medication to the electronic "Automatic Refill" notification system. Patient may have prescription filled one day early if pharmacy is closed on scheduled refill date.  . pregabalin (LYRICA) 50 MG capsule    Sig: 50 mg qday, 100 mg qhs    Dispense:  90 capsule    Refill:  1  . HYDROcodone-acetaminophen (NORCO/VICODIN) 5-325 MG tablet    Sig: Take 1 tablet by mouth 3 (three) times daily as needed.    Dispense:  90 tablet    Refill:  0    Do not add this medication to the electronic "Automatic Refill" notification system. Patient may have prescription filled one day early if pharmacy is closed on scheduled refill date.  Follow-up plan:   Return in about 9 weeks (around 06/01/2019) for Medication  Management.    Recent Visits No visits were found meeting these conditions.  Showing recent visits within past 90 days and meeting all other requirements   Today's Visits Date Type Provider Dept  03/30/19 Office Visit Edward Jolly, MD [facility]  Showing today's visits and meeting all other requirements   Future Appointments No visits were found meeting these conditions.  Showing future appointments within next 90 days and meeting all other requirements   I discussed the assessment and treatment plan with the patient. The patient was provided an opportunity to ask questions and all were answered. The patient agreed with the plan and demonstrated an understanding of the instructions.  Patient advised to call back or seek an in-person evaluation if the symptoms or condition worsens.  Total duration of non-face-to-face encounter: 30 minutes.  Note by: Edward Jolly, MD Date: 03/30/2019; Time: 8:37  AM

## 2019-04-01 ENCOUNTER — Ambulatory Visit: Payer: Medicaid Other

## 2019-05-03 ENCOUNTER — Ambulatory Visit: Admission: RE | Admit: 2019-05-03 | Payer: Medicaid Other | Source: Ambulatory Visit

## 2019-05-18 ENCOUNTER — Other Ambulatory Visit: Payer: Self-pay

## 2019-05-18 ENCOUNTER — Ambulatory Visit
Admission: RE | Admit: 2019-05-18 | Discharge: 2019-05-18 | Disposition: A | Discharge status: Home / Self Care | Payer: Medicaid Other | Source: Ambulatory Visit | Attending: Student | Admitting: Student

## 2019-05-18 DIAGNOSIS — K760 Fatty (change of) liver, not elsewhere classified: Secondary | ICD-10-CM | POA: Diagnosis not present

## 2019-05-24 ENCOUNTER — Ambulatory Visit: Payer: Self-pay | Admitting: Obstetrics and Gynecology

## 2019-05-31 ENCOUNTER — Encounter: Payer: Self-pay | Admitting: Student in an Organized Health Care Education/Training Program

## 2019-05-31 ENCOUNTER — Telehealth: Payer: Self-pay

## 2019-05-31 NOTE — Telephone Encounter (Signed)
Pt returned the call. Please give her call.                                             Thanks

## 2019-05-31 NOTE — Telephone Encounter (Signed)
Called patient for pre virtual appointment questions.  States she is at [employer] and asked if she could call us back.  Informed her that we needed this information for her appointment tomorrow morning.

## 2019-06-01 ENCOUNTER — Ambulatory Visit
Payer: Medicaid Other | Attending: Student in an Organized Health Care Education/Training Program | Admitting: Student in an Organized Health Care Education/Training Program

## 2019-06-01 ENCOUNTER — Other Ambulatory Visit: Payer: Self-pay

## 2019-06-01 ENCOUNTER — Encounter: Payer: Self-pay | Admitting: Student in an Organized Health Care Education/Training Program

## 2019-06-01 VITALS — Ht 59.0 in | Wt 136.0 lb

## 2019-06-01 DIAGNOSIS — M797 Fibromyalgia: Secondary | ICD-10-CM

## 2019-06-01 DIAGNOSIS — Z79891 Long term (current) use of opiate analgesic: Secondary | ICD-10-CM

## 2019-06-01 DIAGNOSIS — M5416 Radiculopathy, lumbar region: Secondary | ICD-10-CM

## 2019-06-01 DIAGNOSIS — G894 Chronic pain syndrome: Secondary | ICD-10-CM

## 2019-06-01 DIAGNOSIS — M7918 Myalgia, other site: Secondary | ICD-10-CM | POA: Diagnosis not present

## 2019-06-01 DIAGNOSIS — M5136 Other intervertebral disc degeneration, lumbar region: Secondary | ICD-10-CM

## 2019-06-01 DIAGNOSIS — F1190 Opioid use, unspecified, uncomplicated: Secondary | ICD-10-CM

## 2019-06-01 DIAGNOSIS — G8929 Other chronic pain: Secondary | ICD-10-CM

## 2019-06-01 MED ORDER — HYDROCODONE-ACETAMINOPHEN 5-325 MG PO TABS: 1.0000 | ORAL_TABLET | Freq: Three times a day (TID) | ORAL | 0 refills | Status: DC | PRN

## 2019-06-01 MED ORDER — PREGABALIN 50 MG PO CAPS: ORAL_CAPSULE | ORAL | 1 refills | Status: DC

## 2019-06-01 MED ORDER — HYDROCODONE-ACETAMINOPHEN 5-325 MG PO TABS: 1.0000 | ORAL_TABLET | Freq: Three times a day (TID) | ORAL | 0 refills | Status: AC | PRN

## 2019-06-01 NOTE — Progress Notes (Signed)
Patient: [name]  Service Category: E/M  Provider: Gillis Santa, MD  DOB: [birth year]  DOS: 06/01/2019  Location: Office  MRN: [id number]  Setting: Ambulatory outpatient  Referring Provider: Sharyne Peach, MD  Type: Established Patient  Specialty: Interventional Pain Management  PCP: Sharyne Peach, MD  Location: Home  Delivery: TeleHealth     Virtual Encounter - Pain Management PROVIDER NOTE: Information contained herein reflects review and annotations entered in association with encounter. Interpretation of such information and data should be left to medically-trained personnel. Information provided to patient can be located elsewhere in the medical record under "Patient Instructions". Document created using STT-dictation technology, any transcriptional errors that may result from process are unintentional.    Contact & Pharmacy Preferred: [phone] Home: [phone] (home) Mobile: There is no such number on file (mobile). E-mail: No e-mail address on record  [facility] [place], [place] - [address] [facility] [place] [address] Phone: [phone] Fax: [phone]   Pre-screening  [name] offered "in-person" vs "virtual" encounter. She indicated preferring virtual for this encounter.   Reason COVID-19*  Social distancing based on CDC and AMA recommendations.   I contacted Tyana Butzer Schloemer on 06/01/2019 via telephone.      I clearly identified myself as Gillis Santa, MD. I verified that I was speaking with the correct person using two identifiers (Name: [name], and date of birth: [birth year]).  This visit was completed via telephone due to the restrictions of the COVID-19 pandemic. All issues as above were discussed and addressed but no physical exam was performed. If it was felt that the patient should be evaluated in the office, they were directed there. The patient verbally consented to this visit.  Patient was unable to complete an audio/visual visit due to Technical difficulties and/or Lack of internet. Due to the catastrophic nature of the COVID-19 pandemic, this visit was done through audio contact only.  Location of the patient: home address (see Epic for details)  Location of the provider: office   Consent I sought verbal advanced consent from [place] for virtual visit interactions. I informed [name] of possible security and privacy concerns, risks, and limitations associated with providing "not-in-person" medical evaluation and management services. I also informed [name] of the availability of "in-person" appointments. Finally, I informed her that there would be a charge for the virtual visit and that she could be  personally, fully or partially, financially responsible for it. [name] expressed understanding and agreed to proceed.   Historic Elements   [name] is a 59 y.o. year old, female patient evaluated today after her last contact with our practice on 05/31/2019. [name]  has a past medical history of Asthma, Asthma, BPPV (benign paroxysmal positional vertigo), Cardiac murmur, unspecified (08/28/2016), CPAP (continuous positive airway pressure) dependence, Degenerative disc disease, lumbar, Depression, Diabetes mellitus, Diverticulitis (10/2016), Fatty liver, Fibromyalgia, Fibromyalgia, GERD (gastroesophageal reflux disease), Headache, History of degenerative disc disease, Hyperlipidemia, and Hypertension. She also  has a past surgical history that includes Abdominal surgery; Abdominal hysterectomy; Esophagogastroduodenoscopy (egd) with propofol (N/A, 02/20/2017); Colonoscopy with propofol (N/A, 02/20/2017); Upper esophageal endoscopic ultrasound (eus) (N/A, 04/03/2017); Diverticulitis; EUS (N/A, 01/08/2018); and Cholecystectomy (N/A, 04/27/2018). [name] has a current medication list which includes the following  prescription(s): accu-chek guide, albuterol, aspirin, budesonide-formoterol, cetirizine, dexlansoprazole, estradiol, famotidine, [START ON 06/12/2019] hydrocodone-acetaminophen, [START ON 07/12/2019] hydrocodone-acetaminophen, hyoscyamine, levemir flextouch, linaclotide, lisinopril, losartan, meclizine, metformin, mirtazapine,  ondansetron, pregabalin, rizatriptan, rosuvastatin, solifenacin, topiramate, trazodone, fexofenadine, losartan, alive once daily womens, polyethylene glycol, and scopolamine. She  reports that she quit smoking about 2 years ago. Her smoking use included cigarettes. She smoked 0.50 packs per day. She has quit using smokeless tobacco. She reports current alcohol use of about 1.0 standard drinks of alcohol per week. She reports previous drug use. [name] is allergic to tramadol; benadryl [diphenhydramine hcl]; bextra [valdecoxib]; invokana [canagliflozin]; and sulfamethoxazole-trimethoprim.   HPI  Today, she is being contacted for medication management.   No change in medical history since last visit.  Patient's pain is at baseline.  Patient continues multimodal pain regimen as prescribed.  States that it provides pain relief and improvement in functional status. Pt states that Lyrica is very beneficial for her b/l hand pain.  Pharmacotherapy Assessment  Analgesic: 05/13/2019  2   03/30/2019  Hydrocodone-Acetamin 5-325 MG  90.00  30 Bi Lat   [id number]   Wal (0252)   0  15.00 MME  Medicaid   [place]    Monitoring: [place] PMP: PDMP reviewed during this encounter.       Pharmacotherapy: No side-effects or adverse reactions reported. Compliance: No problems identified. Effectiveness: Clinically acceptable. Plan: Refer to "POC".  UDS:  Summary  Date Value Ref Range Status  02/24/2019 Note  Final    Comment:    ==================================================================== [employer] Select 13 (MW) ==================================================================== Test                              Result       Flag       Units Drug Present and Declared for Prescription Verification   Hydrocodone                    990          EXPECTED   ng/mg creat   Hydromorphone                  183          EXPECTED   ng/mg creat   Dihydrocodeine                 210          EXPECTED   ng/mg creat   Norhydrocodone                 1496         EXPECTED   ng/mg creat    Sources of hydrocodone include scheduled prescription medications.    Hydromorphone, dihydrocodeine and norhydrocodone are expected    metabolites of hydrocodone. Hydromorphone and dihydrocodeine are    also available as scheduled prescription medications. Drug Present not Declared for Prescription Verification   Alcohol, Ethyl                 0.072        UNEXPECTED g/dL    Sources of ethyl alcohol include alcoholic beverages or as a    fermentation product of glucose; glucose is present in this specimen.    Interpret result with caution, as the presence of ethyl alcohol is    likely due, at least in part, to fermentation of glucose. ==================================================================== Test                      Result    Flag   Units      Ref Range  Creatinine              71               mg/dL      >=20 ==================================================================== Declared Medications:  The flagging and interpretation on this report are based on the  following declared medications.  Unexpected results may arise from  inaccuracies in the declared medications.  **Note: The testing scope of this panel includes these medications:  Hydrocodone  **Note: The testing scope of this panel does not include the  following reported medications:  Acetaminophen  Albuterol  Aspirin  Budesonide (Symbicort)  Cetirizine (Zyrtec)  Dexlansoprazole  Famotidine (Pepcid)  Fexofenadine (Allegra)  Formoterol (Symbicort)  Hyoscyamine  Insulin  Linaclotide (Linzess)  Lisinopril  Losartan  (Cozaar)  Meclizine  Metformin  Mirtazapine (Remeron)  Multivitamin  Naproxen  Ondansetron  Polyethylene Glycol  Pregabalin  Rizatriptan (Maxalt)  Rosuvastatin (Crestor)  Solifenacin (Vesicare)  Topical  Topiramate  Trazodone ==================================================================== For clinical consultation, please call [phone]. ====================================================================    Laboratory Chemistry Profile   Renal Lab Results  Component Value Date   BUN 10 04/20/2018   CREATININE 0.46 04/20/2018   BCR 14 11/27/2017   GFRAA >60 04/20/2018   GFRNONAA >60 04/20/2018    Hepatic Lab Results  Component Value Date   AST 18 04/20/2018   ALT 26 04/20/2018   ALBUMIN 4.7 04/20/2018   ALKPHOS 76 04/20/2018   LIPASE 35 12/07/2017    Electrolytes Lab Results  Component Value Date   NA 140 04/20/2018   K 3.8 04/20/2018   CL 110 04/20/2018   CALCIUM 9.4 04/20/2018   MG 1.8 11/27/2017    Bone No results found for: VD25OH, VD125OH2TOT, ME1583EN4, MH6808UP1, 25OHVITD1, 25OHVITD2, 25OHVITD3, TESTOFREE, TESTOSTERONE  Inflammation (CRP: Acute Phase) (ESR: Chronic Phase) Lab Results  Component Value Date   CRP <1 11/27/2017   ESRSEDRATE 17 11/27/2017   LATICACIDVEN 1.6 10/14/2016      Note: Above Lab results reviewed.  Imaging  US ABDOMEN COMPLETE W/ELASTOGRAPHY CLINICAL DATA:  Hepatic steatosis  EXAM: ULTRASOUND ABDOMEN  ULTRASOUND HEPATIC ELASTOGRAPHY  TECHNIQUE: Sonography of the upper abdomen was performed. In addition, ultrasound elastography evaluation of the liver was performed. A region of interest was placed within the right lobe of the liver. Following application of a compressive sonographic pulse, tissue compressibility was assessed. Multiple assessments were performed at the selected site. Median tissue compressibility was determined. Previously, hepatic stiffness was assessed by shear wave velocity. Based on  recently published Society of Radiologists in Ultrasound consensus article, reporting is now recommended to be performed in the SI units of pressure (kiloPascals) representing hepatic stiffness/elasticity. The obtained result is compared to the published reference standards. (cACLD = compensated Advanced Chronic Liver Disease)  COMPARISON:  Ultrasound abdomen 03/16/2018  FINDINGS: ULTRASOUND ABDOMEN  Gallbladder: Surgically absent  Common bile duct: Diameter: 6 mm, normal  Liver: Echogenic parenchyma, likely fatty infiltration though this can be seen with cirrhosis and certain infiltrative disorders. No focal hepatic mass or nodularity grossly identified, though assessment of deep intrahepatic anatomy is limited by sound attenuation. Portal vein is patent on color Doppler imaging with normal direction of blood flow towards the liver.  IVC: Normal appearance  Pancreas: Portions obscured by bowel gas. Visualized portions of pancreatic body and tail normal appearance.  Spleen: Normal appearance, 6.9 cm length  Right Kidney: Length: 10.3 cm. Small extrarenal pelvis. No gross mass or hydronephrosis.  Left Kidney: Length: 11.1 cm. Normal morphology without mass or hydronephrosis.  Abdominal aorta: Normal caliber  Other findings: No free fluid  ULTRASOUND HEPATIC ELASTOGRAPHY  Device: Siemens Helix VTQ  Patient position: Supine  Transducer 5C1  Number of measurements: 10  Hepatic segment:  8  Median kPa: 2.8  IQR: 0.6  IQR/Median kPa ratio: 0.2  Data quality:  Good  Diagnostic category:  < or = 5 kPa: high probability of being normal  IMPRESSION: ULTRASOUND ABDOMEN:  Probable fatty infiltration of liver as above.  Post cholecystectomy.  Incomplete pancreatic visualization.  ULTRASOUND HEPATIC ELASTOGRAPHY:  Median kPa: 2.8 kPa  Diagnostic category:  < or = 5 kPa: high probability of being normal  The use of hepatic elastography is applicable to  patients with viral hepatitis and non-alcoholic fatty liver disease. At this time, there is insufficient data for the referenced cut-off values and use in other causes of liver disease, including alcoholic liver disease. Patients, however, may be assessed by elastography and serve as their own reference standard/baseline.  In patients with non-alcoholic liver disease, the values suggesting compensated advanced chronic liver disease (cACLD) may be lower, and patients may need additional testing with elasticity results of 7-9 kPa.  Please note that abnormal hepatic elasticity and shear wave velocities may also be identified in clinical settings other than with hepatic fibrosis, such as: acute hepatitis, elevated right heart and central venous pressures including use of beta blockers, veno-occlusive disease (Budd-Chiari), infiltrative processes such as mastocytosis/amyloidosis/infiltrative tumor/lymphoma, extrahepatic cholestasis, with hyperemia in the post-prandial state, and with liver transplantation. Correlation with patient history, laboratory data, and clinical condition recommended.  Diagnostic Categories:  < or =5 kPa: high probability of being normal  < or =9 kPa: in the absence of other known clinical signs, rules out cACLD  >9 kPa and ?13 kPa: suggestive of cACLD, but needs further testing  >13 kPa: highly suggestive of cACLD  > or =17 kPa: highly suggestive of cACLD with an increased probability of clinically significant portal hypertension  Electronically Signed   By: Lavonia Dana M.D.   On: 05/18/2019 12:37  Assessment  The primary encounter diagnosis was Chronic pain syndrome. Diagnoses of Chronic myofascial pain, Lumbar radiculopathy, Fibromyalgia, DDD (degenerative disc disease), lumbar, Long term prescription opiate use, and Chronic, continuous use of opioids were also pertinent to this visit.  Plan of Care  Ms. Philamena Kramar Ferber has a current medication  list which includes the following long-term medication(s): albuterol, budesonide-formoterol, dexlansoprazole, famotidine, hyoscyamine, linaclotide, lisinopril, losartan, metformin, mirtazapine, pregabalin, rizatriptan, topiramate, trazodone, fexofenadine, and losartan.  Pharmacotherapy (Medications Ordered): Meds ordered this encounter  Medications  . HYDROcodone-acetaminophen (NORCO/VICODIN) 5-325 MG tablet    Sig: Take 1 tablet by mouth 3 (three) times daily as needed.    Dispense:  90 tablet    Refill:  0    Do not add this medication to the electronic "Automatic Refill" notification system. Patient may have prescription filled one day early if pharmacy is closed on scheduled refill date.  . pregabalin (LYRICA) 50 MG capsule    Sig: 50 mg qday, 100 mg qhs    Dispense:  90 capsule    Refill:  1  . HYDROcodone-acetaminophen (NORCO/VICODIN) 5-325 MG tablet    Sig: Take 1 tablet by mouth 3 (three) times daily as needed.    Dispense:  90 tablet    Refill:  0    Do not add this medication to the electronic "Automatic Refill" notification system. Patient may have prescription filled one day early if pharmacy is closed on scheduled refill date.  Follow-up plan:   Return in about 8 weeks (around 07/27/2019) for Medication Management, virtual.    Recent Visits Date Type Provider Dept  03/30/19 Office Visit Gillis Santa, MD [facility]  Showing recent visits within past 90 days and meeting all other requirements   Today's Visits Date Type Provider Dept  06/01/19 Office Visit Gillis Santa, MD [facility]  Showing today's visits and meeting all other requirements   Future Appointments No visits were found meeting these conditions.  Showing future appointments within next 90 days and meeting all other requirements   I discussed the assessment and treatment plan with the patient. The patient was provided an opportunity to ask questions and all were answered. The patient  agreed with the plan and demonstrated an understanding of the instructions.  Patient advised to call back or seek an in-person evaluation if the symptoms or condition worsens.  Duration of encounter: 25 minutes.  Note by: Gillis Santa, MD Date: 06/01/2019; Time: 9:05 AM

## 2019-07-22 ENCOUNTER — Encounter: Payer: Self-pay | Admitting: Student in an Organized Health Care Education/Training Program

## 2019-07-26 ENCOUNTER — Other Ambulatory Visit: Payer: Self-pay

## 2019-07-26 ENCOUNTER — Encounter: Payer: Self-pay | Admitting: Student in an Organized Health Care Education/Training Program

## 2019-07-26 ENCOUNTER — Ambulatory Visit
Payer: Medicaid Other | Attending: Student in an Organized Health Care Education/Training Program | Admitting: Student in an Organized Health Care Education/Training Program

## 2019-07-26 DIAGNOSIS — M5416 Radiculopathy, lumbar region: Secondary | ICD-10-CM | POA: Diagnosis not present

## 2019-07-26 DIAGNOSIS — M79641 Pain in right hand: Secondary | ICD-10-CM | POA: Diagnosis not present

## 2019-07-26 DIAGNOSIS — G8929 Other chronic pain: Secondary | ICD-10-CM

## 2019-07-26 DIAGNOSIS — M7918 Myalgia, other site: Secondary | ICD-10-CM | POA: Diagnosis not present

## 2019-07-26 DIAGNOSIS — Z79891 Long term (current) use of opiate analgesic: Secondary | ICD-10-CM

## 2019-07-26 DIAGNOSIS — G894 Chronic pain syndrome: Secondary | ICD-10-CM

## 2019-07-26 DIAGNOSIS — M899 Disorder of bone, unspecified: Secondary | ICD-10-CM

## 2019-07-26 DIAGNOSIS — Z79899 Other long term (current) drug therapy: Secondary | ICD-10-CM

## 2019-07-26 DIAGNOSIS — M79642 Pain in left hand: Secondary | ICD-10-CM

## 2019-07-26 MED ORDER — HYDROCODONE-ACETAMINOPHEN 7.5-325 MG PO TABS: 1.0000 | ORAL_TABLET | Freq: Four times a day (QID) | ORAL | 0 refills | Status: DC | PRN

## 2019-07-26 MED ORDER — PREGABALIN 50 MG PO CAPS: ORAL_CAPSULE | ORAL | 1 refills | Status: DC

## 2019-07-26 MED ORDER — HYDROCODONE-ACETAMINOPHEN 7.5-325 MG PO TABS: 1.0000 | ORAL_TABLET | Freq: Four times a day (QID) | ORAL | 0 refills | Status: AC | PRN

## 2019-07-26 NOTE — Progress Notes (Signed)
Patient: [name]  Service Category: E/M  Provider: Gillis Santa, MD  DOB: 15-Aug-1960  DOS: 07/26/2019  Location: Office  MRN: [id number]  Setting: Ambulatory outpatient  Referring Provider: Sharyne Peach, MD  Type: Established Patient  Specialty: Interventional Pain Management  PCP: Sharyne Peach, MD  Location: Home  Delivery: TeleHealth     Virtual Encounter - Pain Management PROVIDER NOTE: Information contained herein reflects review and annotations entered in association with encounter. Interpretation of such information and data should be left to medically-trained personnel. Information provided to patient can be located elsewhere in the medical record under "Patient Instructions". Document created using STT-dictation technology, any transcriptional errors that may result from process are unintentional.    Contact & Pharmacy Preferred: [phone] Home: [phone] (home) Mobile: There is no such number on file (mobile). E-mail: No e-mail address on record  [place], [place] - [address] [facility] [place] [address] Phone: [phone] Fax: [phone]   Pre-screening  Ms. Calix offered "in-person" vs "virtual" encounter. She indicated preferring virtual for this encounter.   Reason COVID-19*  Social distancing based on CDC and AMA recommendations.   I contacted Vannesa Abair Gouger on 07/26/2019 via video conference.      I clearly identified myself as Gillis Santa, MD. I verified that I was speaking with the correct person using two identifiers (Name: [name], and date of birth: 12-28-1960).  Consent I sought verbal advanced consent from [place] for virtual visit interactions. I informed Ms. Wanek of possible security and privacy concerns, risks, and limitations associated with providing "not-in-person" medical evaluation and management services. I also informed Ms. Bonfiglio  of the availability of "in-person" appointments. Finally, I informed her that there would be a charge for the virtual visit and that she could be  personally, fully or partially, financially responsible for it. Ms. Pettie expressed understanding and agreed to proceed.   Historic Elements   [name] is a 59 y.o. year old, female patient evaluated today after her last contact with our practice on 06/01/2019. [name]  has a past medical history of Asthma, Asthma, BPPV (benign paroxysmal positional vertigo), Cardiac murmur, unspecified (08/28/2016), CPAP (continuous positive airway pressure) dependence, Degenerative disc disease, lumbar, Depression, Diabetes mellitus, Diverticulitis (10/2016), Fatty liver, Fibromyalgia, Fibromyalgia, GERD (gastroesophageal reflux disease), Headache, History of degenerative disc disease, Hyperlipidemia, and Hypertension. She also  has a past surgical history that includes Abdominal surgery; Abdominal hysterectomy; Esophagogastroduodenoscopy (egd) with propofol (N/A, 02/20/2017); Colonoscopy with propofol (N/A, 02/20/2017); Upper esophageal endoscopic ultrasound (eus) (N/A, 04/03/2017); Diverticulitis; EUS (N/A, 01/08/2018); and Cholecystectomy (N/A, 04/27/2018). [name] has a current medication list which includes the following prescription(s): accu-chek guide, albuterol, aspirin, budesonide-formoterol, dexlansoprazole, estradiol, famotidine, hyoscyamine, levemir flextouch, linaclotide, lisinopril, losartan, losartan, meclizine, metformin, mirtazapine, ondansetron, polyethylene glycol, pregabalin, rizatriptan, rosuvastatin, scopolamine, solifenacin, topiramate, trazodone, cetirizine, fexofenadine, [START ON 08/10/2019] hydrocodone-acetaminophen, [START ON 09/09/2019] hydrocodone-acetaminophen, and alive once daily womens. She  reports that she quit smoking about 2 years ago. Her smoking use included cigarettes. She smoked 0.50 packs per day. She has quit  using smokeless tobacco. She reports current alcohol use of about 1.0 standard drinks of alcohol per week. She reports previous drug use. [name] is allergic to tramadol; benadryl [diphenhydramine hcl]; bextra [valdecoxib]; invokana [canagliflozin]; and sulfamethoxazole-trimethoprim.   HPI  Today, she is being contacted for medication management.  Increased pain in mid and low back states that Hydrocodone is  helpful but only lasts 4-5 hours. No issues with bowel movements, constipation, itching Continues Lyrica as prescribed and is helpful. Pharmacotherapy Assessment  Analgesic:  07/12/2019  1   06/01/2019  Pregabalin 50 MG Capsule  90.00  30 Bi Lat   [id number]   [facility] (0252)   0  1.00 LME  Medicaid   [place]  07/12/2019  1   06/01/2019  Hydrocodone-Acetamin 5-325 MG  90.00  30 Bi Lat   [id number]   Wal (0252)   0  15.00 MME  Medicaid   [place]  06/15/2019  1   06/01/2019  Pregabalin 50 MG Capsule  90.00  30 Bi Lat   [id number]   Wal (0252)   0  1.00 LME  Medicaid   [place]  06/12/2019  1   06/01/2019  Hydrocodone-Acetamin 5-325 MG  90.00  30 Bi Lat   [id number]   Wal (0252)   0  15.00 MME  Medicaid        Monitoring:  PMP: PDMP reviewed during this encounter.       Pharmacotherapy: No side-effects or adverse reactions reported. Compliance: No problems identified. Effectiveness: Clinically acceptable. Plan: Refer to "POC".  UDS:  Summary  Date Value Ref Range Status  02/24/2019 Note  Final    Comment:    ==================================================================== [employer] Select 13 (MW) ==================================================================== Test                             Result       Flag       Units Drug Present and Declared for Prescription Verification   Hydrocodone                    990          EXPECTED   ng/mg creat   Hydromorphone                  183          EXPECTED   ng/mg creat   Dihydrocodeine                 210          EXPECTED   ng/mg creat    Norhydrocodone                 1496         EXPECTED   ng/mg creat    Sources of hydrocodone include scheduled prescription medications.    Hydromorphone, dihydrocodeine and norhydrocodone are expected    metabolites of hydrocodone. Hydromorphone and dihydrocodeine are    also available as scheduled prescription medications. Drug Present not Declared for Prescription Verification   Alcohol, Ethyl                 0.072        UNEXPECTED g/dL    Sources of ethyl alcohol include alcoholic beverages or as a    fermentation product of glucose; glucose is present in this specimen.    Interpret result with caution, as the presence of ethyl alcohol is    likely due, at least in part, to fermentation of glucose. ==================================================================== Test                      Result    Flag   Units      Ref Range   Creatinine  71               mg/dL      >=20 ==================================================================== Declared Medications:  The flagging and interpretation on this report are based on the  following declared medications.  Unexpected results may arise from  inaccuracies in the declared medications.  **Note: The testing scope of this panel includes these medications:  Hydrocodone  **Note: The testing scope of this panel does not include the  following reported medications:  Acetaminophen  Albuterol  Aspirin  Budesonide (Symbicort)  Cetirizine (Zyrtec)  Dexlansoprazole  Famotidine (Pepcid)  Fexofenadine (Allegra)  Formoterol (Symbicort)  Hyoscyamine  Insulin  Linaclotide (Linzess)  Lisinopril  Losartan (Cozaar)  Meclizine  Metformin  Mirtazapine (Remeron)  Multivitamin  Naproxen  Ondansetron  Polyethylene Glycol  Pregabalin  Rizatriptan (Maxalt)  Rosuvastatin (Crestor)  Solifenacin (Vesicare)  Topical  Topiramate  Trazodone ==================================================================== For clinical  consultation, please call [phone]. ====================================================================    Laboratory Chemistry Profile   Renal Lab Results  Component Value Date   BUN 10 04/20/2018   CREATININE 0.46 04/20/2018   BCR 14 11/27/2017   GFRAA >60 04/20/2018   GFRNONAA >60 04/20/2018     Hepatic Lab Results  Component Value Date   AST 18 04/20/2018   ALT 26 04/20/2018   ALBUMIN 4.7 04/20/2018   ALKPHOS 76 04/20/2018   LIPASE 35 12/07/2017     Electrolytes Lab Results  Component Value Date   NA 140 04/20/2018   K 3.8 04/20/2018   CL 110 04/20/2018   CALCIUM 9.4 04/20/2018   MG 1.8 11/27/2017     Bone No results found for: VD25OH, VD125OH2TOT, BR8309MM7, WK0881JS3, 25OHVITD1, 25OHVITD2, 25OHVITD3, TESTOFREE, TESTOSTERONE   Inflammation (CRP: Acute Phase) (ESR: Chronic Phase) Lab Results  Component Value Date   CRP <1 11/27/2017   ESRSEDRATE 17 11/27/2017   LATICACIDVEN 1.6 10/14/2016       Note: Above Lab results reviewed.  Imaging  US ABDOMEN COMPLETE W/ELASTOGRAPHY CLINICAL DATA:  Hepatic steatosis  EXAM: ULTRASOUND ABDOMEN  ULTRASOUND HEPATIC ELASTOGRAPHY  TECHNIQUE: Sonography of the upper abdomen was performed. In addition, ultrasound elastography evaluation of the liver was performed. A region of interest was placed within the right lobe of the liver. Following application of a compressive sonographic pulse, tissue compressibility was assessed. Multiple assessments were performed at the selected site. Median tissue compressibility was determined. Previously, hepatic stiffness was assessed by shear wave velocity. Based on recently published Society of Radiologists in Ultrasound consensus article, reporting is now recommended to be performed in the SI units of pressure (kiloPascals) representing hepatic stiffness/elasticity. The obtained result is compared to the published reference standards. (cACLD = compensated Advanced  Chronic Liver Disease)  COMPARISON:  Ultrasound abdomen 03/16/2018  FINDINGS: ULTRASOUND ABDOMEN  Gallbladder: Surgically absent  Common bile duct: Diameter: 6 mm, normal  Liver: Echogenic parenchyma, likely fatty infiltration though this can be seen with cirrhosis and certain infiltrative disorders. No focal hepatic mass or nodularity grossly identified, though assessment of deep intrahepatic anatomy is limited by sound attenuation. Portal vein is patent on color Doppler imaging with normal direction of blood flow towards the liver.  IVC: Normal appearance  Pancreas: Portions obscured by bowel gas. Visualized portions of pancreatic body and tail normal appearance.  Spleen: Normal appearance, 6.9 cm length  Right Kidney: Length: 10.3 cm. Small extrarenal pelvis. No gross mass or hydronephrosis.  Left Kidney: Length: 11.1 cm. Normal morphology without mass or hydronephrosis.  Abdominal aorta: Normal caliber  Other findings: No  free fluid  ULTRASOUND HEPATIC ELASTOGRAPHY  Device: Siemens Helix VTQ  Patient position: Supine  Transducer 5C1  Number of measurements: 10  Hepatic segment:  8  Median kPa: 2.8  IQR: 0.6  IQR/Median kPa ratio: 0.2  Data quality:  Good  Diagnostic category:  < or = 5 kPa: high probability of being normal  IMPRESSION: ULTRASOUND ABDOMEN:  Probable fatty infiltration of liver as above.  Post cholecystectomy.  Incomplete pancreatic visualization.  ULTRASOUND HEPATIC ELASTOGRAPHY:  Median kPa: 2.8 kPa  Diagnostic category:  < or = 5 kPa: high probability of being normal  The use of hepatic elastography is applicable to patients with viral hepatitis and non-alcoholic fatty liver disease. At this time, there is insufficient data for the referenced cut-off values and use in other causes of liver disease, including alcoholic liver disease. Patients, however, may be assessed by elastography and serve as their own reference  standard/baseline.  In patients with non-alcoholic liver disease, the values suggesting compensated advanced chronic liver disease (cACLD) may be lower, and patients may need additional testing with elasticity results of 7-9 kPa.  Please note that abnormal hepatic elasticity and shear wave velocities may also be identified in clinical settings other than with hepatic fibrosis, such as: acute hepatitis, elevated right heart and central venous pressures including use of beta blockers, veno-occlusive disease (Budd-Chiari), infiltrative processes such as mastocytosis/amyloidosis/infiltrative tumor/lymphoma, extrahepatic cholestasis, with hyperemia in the post-prandial state, and with liver transplantation. Correlation with patient history, laboratory data, and clinical condition recommended.  Diagnostic Categories:  < or =5 kPa: high probability of being normal  < or =9 kPa: in the absence of other known clinical signs, rules out cACLD  >9 kPa and ?13 kPa: suggestive of cACLD, but needs further testing  >13 kPa: highly suggestive of cACLD  > or =17 kPa: highly suggestive of cACLD with an increased probability of clinically significant portal hypertension  Electronically Signed   By: Lavonia Dana M.D.   On: 05/18/2019 12:37  Assessment  The primary encounter diagnosis was Chronic pain syndrome. Diagnoses of Lumbar radiculopathy, Chronic myofascial pain, Pain in both hands, Disorder of skeletal system, Long term prescription opiate use, and Pharmacologic therapy were also pertinent to this visit.  Plan of Care   Ms. Janika Jedlicka Rullo has a current medication list which includes the following long-term medication(s): albuterol, budesonide-formoterol, dexlansoprazole, famotidine, hyoscyamine, linaclotide, lisinopril, losartan, losartan, metformin, mirtazapine, pregabalin, rizatriptan, topiramate, trazodone, and fexofenadine.  1.  Increase hydrocodone from 5 mg 3 times daily as  needed to 7.5 mg 3 times daily as needed given increased pain in low back region related to lumbar radiculopathy. 2.  Continue Lyrica 50 mg during the day, 100 mg nightly.  Total daily dose is 150 mg 3.  Follow-up in 2 months for medication management in person.  Pharmacotherapy (Medications Ordered): Meds ordered this encounter  Medications  . HYDROcodone-acetaminophen (NORCO) 7.5-325 MG tablet    Sig: Take 1 tablet by mouth every 6 (six) hours as needed for severe pain. Must last 30 days.    Dispense:  90 tablet    Refill:  0    Chronic Pain. (STOP Act - Not applicable). Fill one day early if closed on scheduled refill date.  Marland Kitchen HYDROcodone-acetaminophen (NORCO) 7.5-325 MG tablet    Sig: Take 1 tablet by mouth every 6 (six) hours as needed for severe pain. Must last 30 days.    Dispense:  90 tablet    Refill:  0    Chronic  Pain. (STOP Act - Not applicable). Fill one day early if closed on scheduled refill date.  . pregabalin (LYRICA) 50 MG capsule    Sig: 50 mg qday, 100 mg qhs    Dispense:  90 capsule    Refill:  1    Follow-up plan:   Return in about 8 weeks (around 09/20/2019) for Medication Management, in person.    Recent Visits Date Type Provider Dept  06/01/19 Office Visit Gillis Santa, MD [facility]  Showing recent visits within past 90 days and meeting all other requirements   Today's Visits Date Type Provider Dept  07/26/19 Telemedicine Gillis Santa, MD [facility]  Showing today's visits and meeting all other requirements   Future Appointments No visits were found meeting these conditions.  Showing future appointments within next 90 days and meeting all other requirements   I discussed the assessment and treatment plan with the patient. The patient was provided an opportunity to ask questions and all were answered. The patient agreed with the plan and demonstrated an understanding of the instructions.  Patient advised to call back or seek an  in-person evaluation if the symptoms or condition worsens.  Duration of encounter: 25 minutes.  Note by: Gillis Santa, MD Date: 07/26/2019; Time: 2:40 PM

## 2019-08-09 ENCOUNTER — Other Ambulatory Visit: Payer: Self-pay

## 2019-08-09 ENCOUNTER — Emergency Department
Admission: EM | Admit: 2019-08-09 | Discharge: 2019-08-09 | Disposition: A | Discharge status: Home / Self Care | Payer: Medicaid Other | Attending: Emergency Medicine | Admitting: Emergency Medicine

## 2019-08-09 DIAGNOSIS — Z79899 Other long term (current) drug therapy: Secondary | ICD-10-CM | POA: Insufficient documentation

## 2019-08-09 DIAGNOSIS — Z794 Long term (current) use of insulin: Secondary | ICD-10-CM | POA: Diagnosis not present

## 2019-08-09 DIAGNOSIS — Z7982 Long term (current) use of aspirin: Secondary | ICD-10-CM | POA: Diagnosis not present

## 2019-08-09 DIAGNOSIS — R102 Pelvic and perineal pain: Secondary | ICD-10-CM | POA: Diagnosis present

## 2019-08-09 DIAGNOSIS — N760 Acute vaginitis: Secondary | ICD-10-CM | POA: Diagnosis not present

## 2019-08-09 DIAGNOSIS — I10 Essential (primary) hypertension: Secondary | ICD-10-CM | POA: Insufficient documentation

## 2019-08-09 DIAGNOSIS — Z87891 Personal history of nicotine dependence: Secondary | ICD-10-CM | POA: Insufficient documentation

## 2019-08-09 DIAGNOSIS — J45909 Unspecified asthma, uncomplicated: Secondary | ICD-10-CM | POA: Diagnosis not present

## 2019-08-09 DIAGNOSIS — E119 Type 2 diabetes mellitus without complications: Secondary | ICD-10-CM | POA: Diagnosis not present

## 2019-08-09 DIAGNOSIS — B9689 Other specified bacterial agents as the cause of diseases classified elsewhere: Secondary | ICD-10-CM | POA: Diagnosis not present

## 2019-08-09 LAB — COMPREHENSIVE METABOLIC PANEL
ALT: 36 U/L (ref 0–44)
AST: 25 U/L (ref 15–41)
Albumin: 3.9 g/dL (ref 3.5–5.0)
Alkaline Phosphatase: 120 U/L (ref 38–126)
Anion gap: 9 (ref 5–15)
BUN: 15 mg/dL (ref 6–20)
CO2: 21 mmol/L — ABNORMAL LOW (ref 22–32)
Calcium: 8.8 mg/dL — ABNORMAL LOW (ref 8.9–10.3)
Chloride: 108 mmol/L (ref 98–111)
Creatinine, Ser: 0.59 mg/dL (ref 0.44–1.00)
GFR calc Af Amer: >60 mL/min (ref >60)
GFR calc non Af Amer: >60 mL/min (ref >60)
Glucose, Bld: 160 mg/dL — ABNORMAL HIGH (ref 70–99)
Potassium: 4.2 mmol/L (ref 3.5–5.1)
Sodium: 138 mmol/L (ref 135–145)
Total Bilirubin: 0.6 mg/dL (ref 0.3–1.2)
Total Protein: 7.2 g/dL (ref 6.5–8.1)

## 2019-08-09 LAB — WET PREP, GENITAL
Clue Cells Wet Prep HPF POC: NONE SEEN
Sperm: NONE SEEN
Trich, Wet Prep: NONE SEEN
Yeast Wet Prep HPF POC: NONE SEEN

## 2019-08-09 LAB — CBC
HCT: 45.7 % (ref 36.0–46.0)
Hemoglobin: 14.3 g/dL (ref 12.0–15.0)
MCH: 28.2 pg (ref 26.0–34.0)
MCHC: 31.3 g/dL (ref 30.0–36.0)
MCV: 90.1 fL (ref 80.0–100.0)
Platelets: 211 10*3/uL (ref 150–400)
RBC: 5.07 MIL/uL (ref 3.87–5.11)
RDW: 15 % (ref 11.5–15.5)
WBC: 5.8 10*3/uL (ref 4.0–10.5)
nRBC: 0 % (ref 0.0–0.2)

## 2019-08-09 LAB — URINALYSIS, COMPLETE (UACMP) WITH MICROSCOPIC
Bacteria, UA: NONE SEEN
Bilirubin Urine: NEGATIVE
Glucose, UA: NEGATIVE mg/dL
Hgb urine dipstick: NEGATIVE
Ketones, ur: NEGATIVE mg/dL
Nitrite: NEGATIVE
Protein, ur: NEGATIVE mg/dL
Specific Gravity, Urine: 1.015 (ref 1.005–1.030)
pH: 5 (ref 5.0–8.0)

## 2019-08-09 LAB — CHLAMYDIA/NGC RT PCR (ARMC ONLY)
Chlamydia Tr: NOT DETECTED
N gonorrhoeae: NOT DETECTED

## 2019-08-09 LAB — LIPASE, BLOOD: Lipase: 19 U/L (ref 11–51)

## 2019-08-09 MED ORDER — FLUCONAZOLE 100 MG PO TABS
100.0000 mg | ORAL_TABLET | Freq: Once | ORAL | 0 refills | Status: AC
Start: 2019-08-09 — End: 2019-08-09

## 2019-08-09 MED ORDER — FLUCONAZOLE 50 MG PO TABS
150.0000 mg | ORAL_TABLET | Freq: Once | ORAL | Status: AC
  Administered 2019-08-09: 150 mg via ORAL
  Filled 2019-08-09: qty 1

## 2019-08-09 MED ORDER — SODIUM CHLORIDE 0.9% FLUSH: 3.0000 mL | Freq: Once | INTRAVENOUS | Status: DC

## 2019-08-09 NOTE — ED Provider Notes (Signed)
[facility] Emergency Department Provider Note   ____________________________________________   First MD Initiated Contact with Patient 08/09/19 1227     (approximate)  I have reviewed the triage vital signs and the nursing notes.   HISTORY  Chief Complaint Pelvic Pain    HPI [name] is a 59 y.o. female with past medical history of hypertension, COPD, diabetes, fibromyalgia, and chronic back pain presents to the ED complaining of pelvic pain.  Patient reports that she has been dealing with almost 2 weeks of pain in her pelvic area.  She describes it as initially being intermittent with sharp pain and irritation.  It is been associated with nausea, but she denies any abdominal pain, fevers, or flank pain.  She does report burning when she urinates and has noticed a small amount of whitish vaginal discharge, but denies bleeding.  She spoke with her OB/GYN over the phone, who prescribed a cream for a yeast infection, but she has not had any improvement in her symptoms.  Patient states she is not sexually active and it has been multiple years since her last menstrual period.        Past Medical History:  Diagnosis Date  . Asthma   . Asthma   . BPPV (benign paroxysmal positional vertigo)   . Cardiac murmur, unspecified 08/28/2016  . CPAP (continuous positive airway pressure) dependence   . Degenerative disc disease, lumbar   . Depression   . Diabetes mellitus   . Diverticulitis 10/2016  . Fatty liver   . Fibromyalgia   . Fibromyalgia   . GERD (gastroesophageal reflux disease)   . Headache   . History of degenerative disc disease   . Hyperlipidemia   . Hypertension     Patient Active Problem List   Diagnosis Date Noted  . Pain in both hands 03/11/2018  . Calculus of gallbladder without cholecystitis without obstruction 03/04/2018  . Chronic constipation 03/04/2018  . Pelvic pain in female 03/04/2018  . Pharmacologic therapy 11/27/2017    . Disorder of skeletal system 11/27/2017  . Problems influencing health status 11/27/2017  . Aortic atherosclerosis (HCC) 11/26/2017  . Depression 09/01/2017  . Smoker 09/01/2017  . Lumbar radiculopathy 05/29/2017  . Chronic bilateral low back pain with bilateral sciatica 05/29/2017  . Chronic pain syndrome 05/29/2017  . Chronic, continuous use of opioids 05/29/2017  . Chronic myofascial pain 05/29/2017  . Primary osteoarthritis of both hips 03/11/2017  . Benign paroxysmal positional vertigo 11/08/2016  . Fatty liver disease, nonalcoholic 10/25/2016  . DDD (degenerative disc disease), lumbar 10/23/2016  . Diverticulitis 10/14/2016  . Cardiac murmur 08/28/2016  . Hyperlipidemia, mixed 05/29/2016  . Type 2 diabetes mellitus without complications (HCC) 05/29/2016  . COPD (chronic obstructive pulmonary disease) (HCC) 01/02/2016  . Essential hypertension 05/15/2015  . Fibromyalgia 04/19/2015  . Diabetes (HCC) 02/08/2015    Past Surgical History:  Procedure Laterality Date  . ABDOMINAL HYSTERECTOMY    . ABDOMINAL SURGERY    . CHOLECYSTECTOMY N/A 04/27/2018   Procedure: LAPAROSCOPIC CHOLECYSTECTOMY;  Surgeon: Carolan Shiver, MD;  Location: [facility] ORS;  Service: General;  Laterality: N/A;  . COLONOSCOPY WITH PROPOFOL N/A 02/20/2017   Procedure: COLONOSCOPY WITH PROPOFOL;  Surgeon: Christena Deem, MD;  Location: [facility] ENDOSCOPY;  Service: Endoscopy;  Laterality: N/A;  . Diverticulitis    . ESOPHAGOGASTRODUODENOSCOPY (EGD) WITH PROPOFOL N/A 02/20/2017   Procedure: ESOPHAGOGASTRODUODENOSCOPY (EGD) WITH PROPOFOL;  Surgeon: Christena Deem, MD;  Location: [facility] ENDOSCOPY;  Service: Endoscopy;  Laterality: N/A;  .  EUS N/A 01/08/2018   Procedure: UPPER ENDOSCOPIC ULTRASOUND (EUS) RADIAL;  Surgeon: Holly Bodily, MD;  Location: [facility] ENDOSCOPY;  Service: Gastroenterology;  Laterality: N/A;  . UPPER ESOPHAGEAL ENDOSCOPIC ULTRASOUND (EUS) N/A 04/03/2017   Procedure: UPPER  ESOPHAGEAL ENDOSCOPIC ULTRASOUND (EUS);  Surgeon: Holly Bodily, MD;  Location: [facility] ENDOSCOPY;  Service: Gastroenterology;  Laterality: N/A;    Prior to Admission medications   Medication Sig Start Date End Date Taking? Authorizing Provider  ACCU-CHEK GUIDE test strip USE TID 12/30/17   [provider]  albuterol (PROVENTIL HFA;VENTOLIN HFA) 108 (90 Base) MCG/ACT inhaler Inhale 1 puff into the lungs 2 (two) times daily as needed.    [provider]  aspirin 81 MG tablet Take 81 mg by mouth daily.     [provider]  budesonide-formoterol (SYMBICORT) 160-4.5 MCG/ACT inhaler Inhale 2 puffs into the lungs 2 (two) times daily.    [provider]  cetirizine (ZYRTEC) 10 MG tablet Take 10 mg by mouth daily. 04/12/18   [provider]  Dexlansoprazole (DEXILANT) 30 MG capsule Take 30 mg by mouth daily.    [provider]  Estradiol (VAGIFEM) 10 MCG TABS vaginal tablet Place 1 tablet vaginally 2 (two) times a week.    [provider]  famotidine (PEPCID) 40 MG tablet Take 40 mg by mouth at bedtime.    [provider]  fexofenadine (ALLEGRA) 180 MG tablet Take 180 mg by mouth daily. 08/29/18   [provider]  fluconazole (DIFLUCAN) 100 MG tablet Take 1 tablet (100 mg total) by mouth once for 1 dose. Take if symptoms have not improved in 72 hours 08/09/19 08/09/19  Blake Divine, MD  HYDROcodone-acetaminophen (NORCO) 7.5-325 MG tablet Take 1 tablet by mouth every 6 (six) hours as needed for severe pain. Must last 30 days. 08/10/19 09/09/19  Gillis Santa, MD  HYDROcodone-acetaminophen (NORCO) 7.5-325 MG tablet Take 1 tablet by mouth every 6 (six) hours as needed for severe pain. Must last 30 days. 09/09/19 10/09/19  Gillis Santa, MD  hyoscyamine (LEVBID) 0.375 MG 12 hr tablet Take 0.375 mg by mouth every 12 (twelve) hours as needed for cramping.    [provider]  LEVEMIR FLEXTOUCH 100 UNIT/ML Pen Inject 44 Units  into the skin daily.  01/07/18   [provider]  linaclotide (LINZESS) 290 MCG CAPS capsule Take 290 mcg by mouth daily before breakfast.    [provider]  lisinopril (PRINIVIL,ZESTRIL) 10 MG tablet Take 15 mg by mouth daily.  04/19/15   [provider]  losartan (COZAAR) 100 MG tablet Take 100 mg by mouth daily. 09/10/18   [provider]  losartan (COZAAR) 50 MG tablet Take 50 mg by mouth daily. 04/17/18   [provider]  meclizine (ANTIVERT) 25 MG tablet Take 25 mg by mouth 3 (three) times daily as needed for dizziness.    [provider]  metFORMIN (GLUCOPHAGE) 1000 MG tablet Take 1,000 mg by mouth 2 (two) times daily.  06/07/16   [provider]  mirtazapine (REMERON) 45 MG tablet Take 45 mg by mouth daily. 04/14/18   [provider]  Multiple Vitamins-Minerals (ALIVE ONCE DAILY WOMENS) TABS Take 1 tablet by mouth daily.    [provider]  ondansetron (ZOFRAN) 4 MG tablet Take 4 mg by mouth every 8 (eight) hours as needed for nausea or vomiting.  12/30/17   [provider]  polyethylene glycol (MIRALAX / GLYCOLAX) packet Take 17 g by mouth daily.  [provider]  pregabalin (LYRICA) 50 MG capsule 50 mg qday, 100 mg qhs 07/26/19   Edward Jolly, MD  rizatriptan (MAXALT) 10 MG tablet Take 10 mg by mouth as needed for migraine. May repeat in 2 hours if needed    [provider]  rosuvastatin (CRESTOR) 20 MG tablet Take 20 mg by mouth daily.    [provider]  scopolamine (TRANSDERM-SCOP) 1 MG/3DAYS Place 1 patch onto the skin every 3 (three) days.    [provider]  solifenacin (VESICARE) 10 MG tablet Take 10 mg by mouth daily. 09/28/18   [provider]  topiramate (TOPAMAX) 25 MG tablet Take 25 mg by mouth 2 (two) times daily.    [provider]  traZODone (DESYREL) 100 MG tablet Take 100 mg by mouth at bedtime.     [provider]     Allergies Tramadol, Benadryl [diphenhydramine hcl], Bextra [valdecoxib], Invokana [canagliflozin], and Sulfamethoxazole-trimethoprim  Family History  Problem Relation Age of Onset  . Diabetes Mother   . Hypertension Mother     Social History Social History   Tobacco Use  . Smoking status: Former Smoker    Packs/day: 0.50    Types: Cigarettes    Quit date: 05/23/2017    Years since quitting: 2.2  . Smokeless tobacco: Former Engineer, water Use Topics  . Alcohol use: Yes    Alcohol/week: 1.0 standard drinks    Types: 1 Glasses of wine per week  . Drug use: Not Currently    Comment: 4 years ago used Crack    Review of Systems  Constitutional: No fever/chills Eyes: No visual changes. ENT: No sore throat. Cardiovascular: Denies chest pain. Respiratory: Denies shortness of breath. Gastrointestinal: No abdominal pain.  Positive for nausea, no vomiting.  No diarrhea.  No constipation. Genitourinary: Positive for pelvic pain and dysuria. Musculoskeletal: Negative for back pain. Skin: Negative for rash. Neurological: Negative for headaches, focal weakness or numbness.  ____________________________________________   PHYSICAL EXAM:  VITAL SIGNS: ED Triage Vitals  Enc Vitals Group     BP 08/09/19 0947 97/68     Pulse Rate 08/09/19 0947 (!) 104     Resp 08/09/19 0947 16     Temp 08/09/19 0949 98.8 F (37.1 C)     Temp Source 08/09/19 0947 Oral     SpO2 08/09/19 0947 97 %     Weight 08/09/19 0948 131 lb (59.4 kg)     Height 08/09/19 0948 4\' 11"  (1.499 m)     Head Circumference --      Peak Flow --      Pain Score 08/09/19 0947 10     Pain Loc --      Pain Edu? --      Excl. in GC? --     Constitutional: Alert and oriented. Eyes: Conjunctivae are normal. Head: Atraumatic. Nose: No congestion/rhinnorhea. Mouth/Throat: Mucous membranes are moist. Neck: Normal ROM Cardiovascular: Normal rate, regular rhythm. Grossly normal heart sounds. Respiratory: Normal  respiratory effort.  No retractions. Lungs CTAB. Gastrointestinal: Soft and nontender.  No CVA tenderness bilaterally.  No distention. Genitourinary: Small amount of whitish vaginal discharge with associated mucosal irritation.  No cervical motion or axial tenderness. Musculoskeletal: No lower extremity tenderness nor edema. Neurologic:  Normal speech and language. No gross focal neurologic deficits are appreciated. Skin:  Skin is warm, dry and intact. No rash noted. Psychiatric: Mood and affect are normal. Speech and behavior are normal.  ____________________________________________   LABS (all labs  ordered are listed, but only abnormal results are displayed)  Labs Reviewed  WET PREP, GENITAL - Abnormal; Notable for the following components:      Result Value   WBC, Wet Prep HPF POC RARE (*)    All other components within normal limits  COMPREHENSIVE METABOLIC PANEL - Abnormal; Notable for the following components:   CO2 21 (*)    Glucose, Bld 160 (*)    Calcium 8.8 (*)    All other components within normal limits  URINALYSIS, COMPLETE (UACMP) WITH MICROSCOPIC - Abnormal; Notable for the following components:   Color, Urine YELLOW (*)    APPearance CLEAR (*)    Leukocytes,Ua TRACE (*)    All other components within normal limits  URINE CULTURE  CHLAMYDIA/NGC RT PCR ([facility] ONLY)  LIPASE, BLOOD  CBC     PROCEDURES  Procedure(s) performed (including Critical Care):  Procedures   ____________________________________________   INITIAL IMPRESSION / ASSESSMENT AND PLAN / ED COURSE       59 year old female presents to the ED complaining of almost 2 weeks of pelvic pain with small amount of whitish vaginal discharge.  She has irritation and discharge noted on pelvic exam with no cervical motion or neck cell tenderness.  She has no abdominal tenderness or CVA tenderness.  Most likely etiology appears to be vaginitis secondary to Candida, wet prep results are pending.   UA does show some signs of infection but given her irritation on pelvic exam, UTI seems less likely.  We will send culture for urine but hold off on treatment for now.  Remainder of lab work is unremarkable.  Wet prep shows WBCs but no obvious yeast, given clinical presentation we will treat with fluconazole and have patient follow-up with her OB/GYN.  She was counseled to return to the ED for new or worsening symptoms, patient agrees with plan.      ____________________________________________   FINAL CLINICAL IMPRESSION(S) / ED DIAGNOSES  Final diagnoses:  Acute vaginitis  Pelvic pain in female     ED Discharge Orders         Ordered    fluconazole (DIFLUCAN) 100 MG tablet   Once     08/09/19 1325           Note:  This document was prepared using Dragon voice recognition software and may include unintentional dictation errors.   Chesley Noon, MD 08/09/19 1326

## 2019-08-09 NOTE — ED Triage Notes (Signed)
Pt c/o pelvic pain with painful urinary and diarrhea intermittently for the past 2 weeks. Denies vomiting.

## 2019-08-11 ENCOUNTER — Telehealth: Payer: Self-pay | Admitting: Student in an Organized Health Care Education/Training Program

## 2019-08-11 LAB — URINE CULTURE: Culture: NO GROWTH

## 2019-08-11 NOTE — Telephone Encounter (Signed)
Needs PA for Hydrocodone. She is due to pick up today.

## 2019-08-11 NOTE — Telephone Encounter (Signed)
done

## 2019-08-29 DIAGNOSIS — F419 Anxiety disorder, unspecified: Secondary | ICD-10-CM | POA: Insufficient documentation

## 2019-08-30 ENCOUNTER — Other Ambulatory Visit: Payer: Self-pay

## 2019-08-30 ENCOUNTER — Encounter: Payer: Self-pay | Admitting: Ophthalmology

## 2019-09-03 ENCOUNTER — Other Ambulatory Visit: Payer: Self-pay

## 2019-09-03 ENCOUNTER — Other Ambulatory Visit
Admission: RE | Admit: 2019-09-03 | Discharge: 2019-09-03 | Disposition: A | Discharge status: Home / Self Care | Payer: Medicaid Other | Source: Ambulatory Visit | Attending: Ophthalmology | Admitting: Ophthalmology

## 2019-09-03 DIAGNOSIS — Z01812 Encounter for preprocedural laboratory examination: Secondary | ICD-10-CM | POA: Insufficient documentation

## 2019-09-03 DIAGNOSIS — Z20822 Contact with and (suspected) exposure to covid-19: Secondary | ICD-10-CM | POA: Insufficient documentation

## 2019-09-03 LAB — SARS CORONAVIRUS 2 (TAT 6-24 HRS): SARS Coronavirus 2: NEGATIVE

## 2019-09-03 NOTE — Discharge Instructions (Signed)

## 2019-09-06 ENCOUNTER — Telehealth: Payer: Self-pay | Admitting: Student in an Organized Health Care Education/Training Program

## 2019-09-06 ENCOUNTER — Ambulatory Visit
Payer: Medicaid Other | Attending: Student in an Organized Health Care Education/Training Program | Admitting: Student in an Organized Health Care Education/Training Program

## 2019-09-06 ENCOUNTER — Other Ambulatory Visit: Payer: Self-pay

## 2019-09-06 DIAGNOSIS — G894 Chronic pain syndrome: Secondary | ICD-10-CM

## 2019-09-06 NOTE — Telephone Encounter (Signed)
Pt called stating that she is having eye surgery tomorrow and had to have a covid test done and be quarantined due to the covid test and would like to have one last virtual visit. Spoke to Dr Cherylann Ratel and he said that was ok.

## 2019-09-06 NOTE — Progress Notes (Signed)
Patient does not need any refills today, refills at pharmacy. Follow up in 1 month for MM.

## 2019-09-07 ENCOUNTER — Ambulatory Visit: Payer: Medicaid Other | Admitting: Anesthesiology

## 2019-09-07 ENCOUNTER — Encounter
Admission: RE | Disposition: A | Discharge status: Home / Self Care | Payer: Self-pay | Source: Home / Self Care | Attending: Ophthalmology

## 2019-09-07 ENCOUNTER — Encounter: Payer: Self-pay | Admitting: Ophthalmology

## 2019-09-07 ENCOUNTER — Ambulatory Visit
Admission: RE | Admit: 2019-09-07 | Discharge: 2019-09-07 | Disposition: A | Discharge status: Home / Self Care | Payer: Medicaid Other | Attending: Ophthalmology | Admitting: Ophthalmology

## 2019-09-07 ENCOUNTER — Other Ambulatory Visit: Payer: Self-pay

## 2019-09-07 DIAGNOSIS — R519 Headache, unspecified: Secondary | ICD-10-CM | POA: Insufficient documentation

## 2019-09-07 DIAGNOSIS — K5790 Diverticulosis of intestine, part unspecified, without perforation or abscess without bleeding: Secondary | ICD-10-CM | POA: Insufficient documentation

## 2019-09-07 DIAGNOSIS — Z90710 Acquired absence of both cervix and uterus: Secondary | ICD-10-CM | POA: Insufficient documentation

## 2019-09-07 DIAGNOSIS — M797 Fibromyalgia: Secondary | ICD-10-CM | POA: Insufficient documentation

## 2019-09-07 DIAGNOSIS — H2512 Age-related nuclear cataract, left eye: Secondary | ICD-10-CM | POA: Diagnosis present

## 2019-09-07 DIAGNOSIS — K760 Fatty (change of) liver, not elsewhere classified: Secondary | ICD-10-CM | POA: Diagnosis not present

## 2019-09-07 DIAGNOSIS — F17200 Nicotine dependence, unspecified, uncomplicated: Secondary | ICD-10-CM | POA: Insufficient documentation

## 2019-09-07 DIAGNOSIS — E119 Type 2 diabetes mellitus without complications: Secondary | ICD-10-CM | POA: Diagnosis not present

## 2019-09-07 DIAGNOSIS — G4730 Sleep apnea, unspecified: Secondary | ICD-10-CM | POA: Diagnosis not present

## 2019-09-07 DIAGNOSIS — M5416 Radiculopathy, lumbar region: Secondary | ICD-10-CM | POA: Insufficient documentation

## 2019-09-07 DIAGNOSIS — E1136 Type 2 diabetes mellitus with diabetic cataract: Secondary | ICD-10-CM | POA: Diagnosis not present

## 2019-09-07 DIAGNOSIS — E785 Hyperlipidemia, unspecified: Secondary | ICD-10-CM | POA: Diagnosis not present

## 2019-09-07 DIAGNOSIS — F329 Major depressive disorder, single episode, unspecified: Secondary | ICD-10-CM | POA: Insufficient documentation

## 2019-09-07 DIAGNOSIS — Z9049 Acquired absence of other specified parts of digestive tract: Secondary | ICD-10-CM | POA: Insufficient documentation

## 2019-09-07 DIAGNOSIS — M1990 Unspecified osteoarthritis, unspecified site: Secondary | ICD-10-CM | POA: Diagnosis not present

## 2019-09-07 DIAGNOSIS — I10 Essential (primary) hypertension: Secondary | ICD-10-CM | POA: Diagnosis not present

## 2019-09-07 DIAGNOSIS — R011 Cardiac murmur, unspecified: Secondary | ICD-10-CM | POA: Insufficient documentation

## 2019-09-07 DIAGNOSIS — M5430 Sciatica, unspecified side: Secondary | ICD-10-CM | POA: Insufficient documentation

## 2019-09-07 DIAGNOSIS — Z881 Allergy status to other antibiotic agents status: Secondary | ICD-10-CM | POA: Insufficient documentation

## 2019-09-07 DIAGNOSIS — R0609 Other forms of dyspnea: Secondary | ICD-10-CM | POA: Insufficient documentation

## 2019-09-07 DIAGNOSIS — J449 Chronic obstructive pulmonary disease, unspecified: Secondary | ICD-10-CM | POA: Insufficient documentation

## 2019-09-07 DIAGNOSIS — Z888 Allergy status to other drugs, medicaments and biological substances status: Secondary | ICD-10-CM | POA: Diagnosis not present

## 2019-09-07 DIAGNOSIS — E7800 Pure hypercholesterolemia, unspecified: Secondary | ICD-10-CM | POA: Diagnosis not present

## 2019-09-07 DIAGNOSIS — K219 Gastro-esophageal reflux disease without esophagitis: Secondary | ICD-10-CM | POA: Diagnosis not present

## 2019-09-07 HISTORY — DX: Sleep apnea, unspecified: G47.30

## 2019-09-07 HISTORY — PX: CATARACT EXTRACTION W/PHACO: SHX586

## 2019-09-07 LAB — GLUCOSE, CAPILLARY
Glucose-Capillary: 280 mg/dL — ABNORMAL HIGH (ref 70–99)
Glucose-Capillary: 296 mg/dL — ABNORMAL HIGH (ref 70–99)

## 2019-09-07 SURGERY — PHACOEMULSIFICATION, CATARACT, WITH IOL INSERTION
Anesthesia: Monitor Anesthesia Care | Site: Eye | Laterality: Left

## 2019-09-07 MED ORDER — TETRACAINE HCL 0.5 % OP SOLN
1.0000 [drp] | OPHTHALMIC | Status: DC | PRN
  Administered 2019-09-07 (×3): 1 [drp] via OPHTHALMIC

## 2019-09-07 MED ORDER — ARMC OPHTHALMIC DILATING DROPS
1.0000 "application " | OPHTHALMIC | Status: DC | PRN
  Administered 2019-09-07 (×3): 1 via OPHTHALMIC

## 2019-09-07 MED ORDER — EPINEPHRINE PF 1 MG/ML IJ SOLN
INTRAOCULAR | Status: DC | PRN
  Administered 2019-09-07: 60 mL via OPHTHALMIC

## 2019-09-07 MED ORDER — INSULIN LISPRO 100 UNIT/ML ~~LOC~~ SOLN: 2.0000 [IU] | Freq: Once | SUBCUTANEOUS | Status: DC

## 2019-09-07 MED ORDER — FENTANYL CITRATE (PF) 100 MCG/2ML IJ SOLN
INTRAMUSCULAR | Status: DC | PRN
  Administered 2019-09-07: 50 ug via INTRAVENOUS

## 2019-09-07 MED ORDER — LIDOCAINE HCL (PF) 2 % IJ SOLN
INTRAOCULAR | Status: DC | PRN
  Administered 2019-09-07: 1 mL

## 2019-09-07 MED ORDER — MOXIFLOXACIN HCL 0.5 % OP SOLN
OPHTHALMIC | Status: DC | PRN
  Administered 2019-09-07: 0.2 mL via OPHTHALMIC

## 2019-09-07 MED ORDER — BRIMONIDINE TARTRATE-TIMOLOL 0.2-0.5 % OP SOLN
OPHTHALMIC | Status: DC | PRN
  Administered 2019-09-07: 1 [drp] via OPHTHALMIC

## 2019-09-07 MED ORDER — NA CHONDROIT SULF-NA HYALURON 40-17 MG/ML IO SOLN
INTRAOCULAR | Status: DC | PRN
  Administered 2019-09-07: 1 mL via INTRAOCULAR

## 2019-09-07 MED ORDER — MIDAZOLAM HCL 2 MG/2ML IJ SOLN
INTRAMUSCULAR | Status: DC | PRN
  Administered 2019-09-07: 1 mg via INTRAVENOUS
  Administered 2019-09-07: .5 mg via INTRAVENOUS

## 2019-09-07 SURGICAL SUPPLY — 22 items
CANNULA ANT/CHMB 27G (MISCELLANEOUS) ×2 IMPLANT
CANNULA ANT/CHMB 27GA (MISCELLANEOUS) ×4 IMPLANT
GLOVE SURG LX 8.0 MICRO (GLOVE) ×2
GLOVE SURG LX STRL 8.0 MICRO (GLOVE) ×1 IMPLANT
GLOVE SURG TRIUMPH 8.0 PF LTX (GLOVE) ×2 IMPLANT
GOWN STRL REUS W/ TWL LRG LVL3 (GOWN DISPOSABLE) ×2 IMPLANT
GOWN STRL REUS W/TWL LRG LVL3 (GOWN DISPOSABLE) ×4
LENS IOL DIOP 19.0 (Intraocular Lens) ×2 IMPLANT
LENS IOL TECNIS MONO 19.0 (Intraocular Lens) IMPLANT
MARKER SKIN DUAL TIP RULER LAB (MISCELLANEOUS) ×2 IMPLANT
NDL FILTER BLUNT 18X1 1/2 (NEEDLE) ×1 IMPLANT
NEEDLE FILTER BLUNT 18X 1/2SAF (NEEDLE) ×1
NEEDLE FILTER BLUNT 18X1 1/2 (NEEDLE) ×1 IMPLANT
PACK EYE AFTER SURG (MISCELLANEOUS) ×2 IMPLANT
PACK OPTHALMIC (MISCELLANEOUS) ×2 IMPLANT
PACK PORFILIO (MISCELLANEOUS) ×2 IMPLANT
SUT ETHILON 10-0 CS-B-6CS-B-6 (SUTURE)
SUTURE EHLN 10-0 CS-B-6CS-B-6 (SUTURE) IMPLANT
SYR 3ML LL SCALE MARK (SYRINGE) ×2 IMPLANT
SYR TB 1ML LUER SLIP (SYRINGE) ×2 IMPLANT
WATER STERILE IRR 250ML POUR (IV SOLUTION) ×2 IMPLANT
WIPE NON LINTING 3.25X3.25 (MISCELLANEOUS) ×2 IMPLANT

## 2019-09-07 NOTE — Transfer of Care (Signed)
Immediate Anesthesia Transfer of Care Note  Patient: [name]  Procedure(s) Performed: CATARACT EXTRACTION PHACO AND INTRAOCULAR LENS PLACEMENT (IOC) LEFT DIABETIC (Left Eye)  Patient Location: PACU  Anesthesia Type: MAC  Level of Consciousness: awake, alert  and patient cooperative  Airway and Oxygen Therapy: Patient Spontanous Breathing   Post-op Assessment: Post-op Vital signs reviewed, Patient's Cardiovascular Status Stable, Respiratory Function Stable, Patent Airway and No signs of Nausea or vomiting  Post-op Vital Signs: Reviewed and stable  Complications: No complications documented.

## 2019-09-07 NOTE — Anesthesia Postprocedure Evaluation (Signed)
Anesthesia Post Note  Patient: [name]  Procedure(s) Performed: CATARACT EXTRACTION PHACO AND INTRAOCULAR LENS PLACEMENT (IOC) LEFT DIABETIC (Left Eye)     Patient location during evaluation: PACU Anesthesia Type: MAC Level of consciousness: awake and alert Pain management: pain level controlled Vital Signs Assessment: post-procedure vital signs reviewed and stable Respiratory status: spontaneous breathing, nonlabored ventilation, respiratory function stable and patient connected to nasal cannula oxygen Cardiovascular status: stable and blood pressure returned to baseline Postop Assessment: no apparent nausea or vomiting Anesthetic complications: no   No complications documented.  [name] A  [name]

## 2019-09-07 NOTE — Anesthesia Preprocedure Evaluation (Signed)
Anesthesia Evaluation  Patient identified by MRN, date of birth, ID band Patient awake    Reviewed: Allergy & Precautions, NPO status , Patient's Chart, lab work & pertinent test results, reviewed documented beta blocker date and time   History of Anesthesia Complications Negative for: history of anesthetic complications  Airway Mallampati: III  TM Distance: >3 FB Neck ROM: Full    Dental  (+) Upper Dentures, Poor Dentition   Pulmonary asthma , sleep apnea , COPD, Current SmokerPatient did not abstain from smoking.,    breath sounds clear to auscultation       Cardiovascular Exercise Tolerance: Poor hypertension, (-) angina+ DOE (Chronic, stable)   Rhythm:Regular Rate:Normal   HLD   Neuro/Psych  Headaches, PSYCHIATRIC DISORDERS Depression  Neuromuscular disease (Lumbar radiculopathy, sciatica)    GI/Hepatic GERD  Controlled and Medicated, Fatty liver  Diverticulosis   Endo/Other  diabetes  Renal/GU      Musculoskeletal  (+) Arthritis , Osteoarthritis,  Fibromyalgia -  Abdominal   Peds  Hematology   Anesthesia Other Findings   Reproductive/Obstetrics                             Anesthesia Physical Anesthesia Plan  ASA: III  Anesthesia Plan: MAC   Post-op Pain Management:    Induction: Intravenous  PONV Risk Score and Plan: 1 and TIVA, Midazolam and Treatment may vary due to age or medical condition  Airway Management Planned: Nasal Cannula  Additional Equipment:   Intra-op Plan:   Post-operative Plan:   Informed Consent: I have reviewed the patients History and Physical, chart, labs and discussed the procedure including the risks, benefits and alternatives for the proposed anesthesia with the patient or authorized representative who has indicated his/her understanding and acceptance.       Plan Discussed with: CRNA and Anesthesiologist  Anesthesia Plan Comments:          Anesthesia Quick Evaluation

## 2019-09-07 NOTE — H&P (Signed)
All labs reviewed. Abnormal studies sent to patients PCP when indicated.  Previous H&P reviewed, patient examined, there are NO CHANGES.  [name] Porfilio6/15/20217:26 AM

## 2019-09-07 NOTE — Op Note (Signed)
PREOPERATIVE DIAGNOSIS:  Nuclear sclerotic cataract of the left eye.   POSTOPERATIVE DIAGNOSIS:  Nuclear sclerotic cataract of the left eye.   OPERATIVE PROCEDURE:@   SURGEON:  Galen Manila, MD.   ANESTHESIA:  Anesthesiologist: Heniser, Burman Foster, MD CRNA: Jiles Garter, CRNA  1.      Managed anesthesia care. 2.     0.76ml of Shugarcaine was instilled following the paracentesis   COMPLICATIONS:  None.   TECHNIQUE:   Stop and chop   DESCRIPTION OF PROCEDURE:  The patient was examined and consented in the preoperative holding area where the aforementioned topical anesthesia was applied to the left eye and then brought back to the Operating Room where the left eye was prepped and draped in the usual sterile ophthalmic fashion and a lid speculum was placed. A paracentesis was created with the side port blade and the anterior chamber was filled with viscoelastic. A near clear corneal incision was performed with the steel keratome. A continuous curvilinear capsulorrhexis was performed with a cystotome followed by the capsulorrhexis forceps. Hydrodissection and hydrodelineation were carried out with BSS on a blunt cannula. The lens was removed in a stop and chop  technique and the remaining cortical material was removed with the irrigation-aspiration handpiece. The capsular bag was inflated with viscoelastic and the Technis [id number] lens was placed in the capsular bag without complication. The remaining viscoelastic was removed from the eye with the irrigation-aspiration handpiece. The wounds were hydrated. The anterior chamber was flushed with BSS and the eye was inflated to physiologic pressure. 0.37ml Vigamox was placed in the anterior chamber. The wounds were found to be water tight. The eye was dressed with Combigan. The patient was given protective glasses to wear throughout the day and a shield with which to sleep tonight. The patient was also given drops with which to begin a drop regimen today  and will follow-up with me in one day. Implant Name Type Inv. Item Serial No. Manufacturer Lot No. LRB No. Used Action  LENS IOL DIOP 19.0 - [id number] Intraocular Lens LENS IOL DIOP 19.0 [id number] AMO  Left 1 Implanted    Procedure(s) with comments: CATARACT EXTRACTION PHACO AND INTRAOCULAR LENS PLACEMENT (IOC) LEFT DIABETIC (Left) - 4.01 0:43.8  Electronically signed: Galen Manila 09/07/2019 7:51 AM

## 2019-09-07 NOTE — Anesthesia Procedure Notes (Signed)
Procedure Name: MAC Date/Time: 09/07/2019 7:32 AM Performed by: Vanetta Shawl, CRNA Pre-anesthesia Checklist: Patient identified, Emergency Drugs available, Suction available, Timeout performed and Patient being monitored Patient Re-evaluated:Patient Re-evaluated prior to induction Oxygen Delivery Method: Nasal cannula Placement Confirmation: positive ETCO2

## 2019-09-08 ENCOUNTER — Encounter: Payer: Self-pay | Admitting: Ophthalmology

## 2019-09-13 ENCOUNTER — Telehealth: Payer: Self-pay | Admitting: Student in an Organized Health Care Education/Training Program

## 2019-09-13 NOTE — Telephone Encounter (Signed)
Spoke with pharmacy and they state they have a script ready to pick up.  Patient notified.

## 2019-09-13 NOTE — Telephone Encounter (Signed)
Called pharmacy to fill Hydrocodone script and was told the pharmacy does not have one on file. Please call pharmacy and find out what the problem is and let patient know status. May need prior auth.

## 2019-09-20 ENCOUNTER — Other Ambulatory Visit: Payer: Self-pay

## 2019-09-20 ENCOUNTER — Encounter: Payer: Self-pay | Admitting: Ophthalmology

## 2019-09-22 ENCOUNTER — Encounter: Payer: Self-pay | Admitting: Student in an Organized Health Care Education/Training Program

## 2019-09-22 ENCOUNTER — Other Ambulatory Visit: Payer: Self-pay

## 2019-09-22 ENCOUNTER — Ambulatory Visit
Payer: Medicaid Other | Attending: Student in an Organized Health Care Education/Training Program | Admitting: Student in an Organized Health Care Education/Training Program

## 2019-09-22 VITALS — BP 121/72 | HR 104 | Temp 98.0°F | Resp 18 | Ht 59.0 in | Wt 131.0 lb

## 2019-09-22 DIAGNOSIS — F1190 Opioid use, unspecified, uncomplicated: Secondary | ICD-10-CM | POA: Diagnosis present

## 2019-09-22 DIAGNOSIS — M7918 Myalgia, other site: Secondary | ICD-10-CM | POA: Insufficient documentation

## 2019-09-22 DIAGNOSIS — G894 Chronic pain syndrome: Secondary | ICD-10-CM | POA: Insufficient documentation

## 2019-09-22 DIAGNOSIS — M899 Disorder of bone, unspecified: Secondary | ICD-10-CM | POA: Insufficient documentation

## 2019-09-22 DIAGNOSIS — M5416 Radiculopathy, lumbar region: Secondary | ICD-10-CM | POA: Insufficient documentation

## 2019-09-22 DIAGNOSIS — M79642 Pain in left hand: Secondary | ICD-10-CM | POA: Diagnosis present

## 2019-09-22 DIAGNOSIS — Z79891 Long term (current) use of opiate analgesic: Secondary | ICD-10-CM | POA: Diagnosis present

## 2019-09-22 DIAGNOSIS — M79641 Pain in right hand: Secondary | ICD-10-CM | POA: Insufficient documentation

## 2019-09-22 DIAGNOSIS — G8929 Other chronic pain: Secondary | ICD-10-CM | POA: Insufficient documentation

## 2019-09-22 MED ORDER — HYDROCODONE-ACETAMINOPHEN 7.5-325 MG PO TABS: 1.0000 | ORAL_TABLET | Freq: Four times a day (QID) | ORAL | 0 refills | Status: AC | PRN

## 2019-09-22 MED ORDER — PREGABALIN 50 MG PO CAPS: ORAL_CAPSULE | ORAL | 1 refills | Status: DC

## 2019-09-22 MED ORDER — HYDROCODONE-ACETAMINOPHEN 7.5-325 MG PO TABS: 1.0000 | ORAL_TABLET | Freq: Four times a day (QID) | ORAL | 0 refills | Status: DC | PRN

## 2019-09-22 NOTE — Patient Instructions (Signed)
Two prescriptions for Hydrocodone and one for Lyrica have been sent to your pharmacy.

## 2019-09-22 NOTE — Progress Notes (Signed)
PROVIDER NOTE: Information contained herein reflects review and annotations entered in association with encounter. Interpretation of such information and data should be left to medically-trained personnel. Information provided to patient can be located elsewhere in the medical record under "Patient Instructions". Document created using STT-dictation technology, any transcriptional errors that may result from process are unintentional.    Patient: [name]  Service Category: E/M  Provider: Gillis Santa, MD  DOB: 28-May-1960  DOS: 09/22/2019  Specialty: Interventional Pain Management  MRN: [id number]  Setting: Ambulatory outpatient  PCP: Sharyne Peach, MD  Type: Established Patient    Referring Provider: Sharyne Peach, MD  Location: Office  Delivery: Face-to-face     HPI  Reason for encounter: [name], a 59 y.o. year old female, is here today for evaluation and management of her Lumbar radiculopathy [M54.16]. [name]'s primary complain today is Back Pain (low) Last encounter: Practice (09/13/2019). My last encounter with her was on 09/13/2019. Pertinent problems: [name] has Fibromyalgia; Lumbar radiculopathy; Chronic bilateral low back pain with bilateral sciatica; Chronic pain syndrome; Chronic, continuous use of opioids; Chronic myofascial pain; Primary osteoarthritis of both hips; Type 2 diabetes mellitus without complications ([place]); and Pain in both hands on their pertinent problem list. Pain Assessment: Severity of Chronic pain is reported as a 6 /10. Location: Back Lower/radiates down both legs to ankle. Onset: More than a month ago. Quality: Aching. Timing: Constant. Modifying factor(s): meds. Vitals:  height is _0  (1.499 m) and weight is 131 lb (59.4 kg). Her temperature is 98 F (36.7 C). Her blood pressure is 121/72 and her pulse is 104 (abnormal). Her respiration is 18 and oxygen saturation is 94%.   No change in medical history since last  visit other than cataract surgery that patient had on September 07, 2019.  Patient's pain is at baseline.  Patient continues multimodal pain regimen as prescribed.  States that it provides pain relief and improvement in functional status.   Pharmacotherapy Assessment   09/13/2019  2   07/26/2019  Hydrocodone-Acetamin 7.5-325  90.00  22 Bi Lat   132606   [facility] (0252)   0  30.68 MME  Medicaid   [place]      Monitoring: [place] PMP: PDMP reviewed during this encounter.       Pharmacotherapy: No side-effects or adverse reactions reported. Compliance: No problems identified. Effectiveness: Clinically acceptable.  UDS:  Summary  Date Value Ref Range Status  02/24/2019 Note  Final    Comment:    ==================================================================== [facility] (MW) ==================================================================== Test                             Result       Flag       Units Drug Present and Declared for Prescription Verification   Hydrocodone                    990          EXPECTED   ng/mg creat   Hydromorphone                  183          EXPECTED   ng/mg creat   Dihydrocodeine                 210          EXPECTED   ng/mg creat   Norhydrocodone  1496         EXPECTED   ng/mg creat    Sources of hydrocodone include scheduled prescription medications.    Hydromorphone, dihydrocodeine and norhydrocodone are expected    metabolites of hydrocodone. Hydromorphone and dihydrocodeine are    also available as scheduled prescription medications. Drug Present not Declared for Prescription Verification   Alcohol, Ethyl                 0.072        UNEXPECTED g/dL    Sources of ethyl alcohol include alcoholic beverages or as a    fermentation product of glucose; glucose is present in this specimen.    Interpret result with caution, as the presence of ethyl alcohol is    likely due, at least in part, to fermentation of  glucose. ==================================================================== Test                      Result    Flag   Units      Ref Range   Creatinine              71               mg/dL      >=20 ==================================================================== Declared Medications:  The flagging and interpretation on this report are based on the  following declared medications.  Unexpected results may arise from  inaccuracies in the declared medications.  **Note: The testing scope of this panel includes these medications:  Hydrocodone  **Note: The testing scope of this panel does not include the  following reported medications:  Acetaminophen  Albuterol  Aspirin  Budesonide (Symbicort)  Cetirizine (Zyrtec)  Dexlansoprazole  Famotidine (Pepcid)  Fexofenadine (Allegra)  Formoterol (Symbicort)  Hyoscyamine  Insulin  Linaclotide (Linzess)  Lisinopril  Losartan (Cozaar)  Meclizine  Metformin  Mirtazapine (Remeron)  Multivitamin  Naproxen  Ondansetron  Polyethylene Glycol  Pregabalin  Rizatriptan (Maxalt)  Rosuvastatin (Crestor)  Solifenacin (Vesicare)  Topical  Topiramate  Trazodone ==================================================================== For clinical consultation, please call [phone]. ====================================================================       ROS  Constitutional: Denies any fever or chills Gastrointestinal: No reported hemesis, hematochezia, vomiting, or acute GI distress Musculoskeletal: Denies any acute onset joint swelling, redness, loss of ROM, or weakness Neurological: No reported episodes of acute onset apraxia, aphasia, dysarthria, agnosia, amnesia, paralysis, loss of coordination, or loss of consciousness  Medication Review  Alive Once Daily Womens, Dexlansoprazole, Estradiol, HYDROcodone-acetaminophen, albuterol, aspirin, budesonide-formoterol, famotidine, fesoterodine, fexofenadine, fluticasone, glucose blood,  insulin detemir, insulin regular, linaclotide, losartan, metFORMIN, mirtazapine, ondansetron, polyethylene glycol, pregabalin, rizatriptan, rosuvastatin, scopolamine, solifenacin, topiramate, traZODone, venlafaxine, and venlafaxine XR  History Review  Allergy: Ms. Macfadden is allergic to tramadol, benadryl [diphenhydramine hcl], bextra [valdecoxib], invokana [canagliflozin], and sulfamethoxazole-trimethoprim. Drug: Ms. Malmquist  reports previous drug use. Alcohol:  reports current alcohol use of about 1.0 standard drink of alcohol per week. Tobacco:  reports that she has been smoking cigarettes. She has a 7.00 pack-year smoking history. She has quit using smokeless tobacco. Social: Ms. Grosshans  reports that she has been smoking cigarettes. She has a 7.00 pack-year smoking history. She has quit using smokeless tobacco. She reports current alcohol use of about 1.0 standard drink of alcohol per week. She reports previous drug use. Medical:  has a past medical history of Asthma, Asthma, BPPV (benign paroxysmal positional vertigo), Cardiac murmur, unspecified (08/28/2016), CPAP (continuous positive airway pressure) dependence, Degenerative disc disease, lumbar, Depression, Diabetes mellitus, Diverticulitis (10/2016), Fatty liver,  Fibromyalgia, Fibromyalgia, GERD (gastroesophageal reflux disease), Headache, History of degenerative disc disease, Hyperlipidemia, Hypertension, and Sleep apnea. Surgical: Ms. Rudy  has a past surgical history that includes Abdominal surgery; Abdominal hysterectomy; Esophagogastroduodenoscopy (egd) with propofol (N/A, 02/20/2017); Colonoscopy with propofol (N/A, 02/20/2017); Upper esophageal endoscopic ultrasound (eus) (N/A, 04/03/2017); Diverticulitis; EUS (N/A, 01/08/2018); Cholecystectomy (N/A, 04/27/2018); and Cataract extraction w/PHACO (Left, 09/07/2019). Family: family history includes Diabetes in her mother; Hypertension in her mother.  Laboratory Chemistry  Profile   Renal Lab Results  Component Value Date   BUN 15 08/09/2019   CREATININE 0.59 08/09/2019   BCR 14 11/27/2017   GFRAA >60 08/09/2019   GFRNONAA >60 08/09/2019     Hepatic Lab Results  Component Value Date   AST 25 08/09/2019   ALT 36 08/09/2019   ALBUMIN 3.9 08/09/2019   ALKPHOS 120 08/09/2019   LIPASE 19 08/09/2019     Electrolytes Lab Results  Component Value Date   NA 138 08/09/2019   K 4.2 08/09/2019   CL 108 08/09/2019   CALCIUM 8.8 (L) 08/09/2019   MG 1.8 11/27/2017     Bone No results found for: VD25OH, VD125OH2TOT, JQ7341PF7, TK2409BD5, 25OHVITD1, 25OHVITD2, 25OHVITD3, TESTOFREE, TESTOSTERONE   Inflammation (CRP: Acute Phase) (ESR: Chronic Phase) Lab Results  Component Value Date   CRP <1 11/27/2017   ESRSEDRATE 17 11/27/2017   LATICACIDVEN 1.6 10/14/2016       Note: Above Lab results reviewed.  Recent Imaging Review  US ABDOMEN COMPLETE W/ELASTOGRAPHY CLINICAL DATA:  Hepatic steatosis  EXAM: ULTRASOUND ABDOMEN  ULTRASOUND HEPATIC ELASTOGRAPHY  TECHNIQUE: Sonography of the upper abdomen was performed. In addition, ultrasound elastography evaluation of the liver was performed. A region of interest was placed within the right lobe of the liver. Following application of a compressive sonographic pulse, tissue compressibility was assessed. Multiple assessments were performed at the selected site. Median tissue compressibility was determined. Previously, hepatic stiffness was assessed by shear wave velocity. Based on recently published Society of Radiologists in Ultrasound consensus article, reporting is now recommended to be performed in the SI units of pressure (kiloPascals) representing hepatic stiffness/elasticity. The obtained result is compared to the published reference standards. (cACLD = compensated Advanced Chronic Liver Disease)  COMPARISON:  Ultrasound abdomen 03/16/2018  FINDINGS: ULTRASOUND ABDOMEN  Gallbladder:  Surgically absent  Common bile duct: Diameter: 6 mm, normal  Liver: Echogenic parenchyma, likely fatty infiltration though this can be seen with cirrhosis and certain infiltrative disorders. No focal hepatic mass or nodularity grossly identified, though assessment of deep intrahepatic anatomy is limited by sound attenuation. Portal vein is patent on color Doppler imaging with normal direction of blood flow towards the liver.  IVC: Normal appearance  Pancreas: Portions obscured by bowel gas. Visualized portions of pancreatic body and tail normal appearance.  Spleen: Normal appearance, 6.9 cm length  Right Kidney: Length: 10.3 cm. Small extrarenal pelvis. No gross mass or hydronephrosis.  Left Kidney: Length: 11.1 cm. Normal morphology without mass or hydronephrosis.  Abdominal aorta: Normal caliber  Other findings: No free fluid  ULTRASOUND HEPATIC ELASTOGRAPHY  Device: Siemens Helix VTQ  Patient position: Supine  Transducer 5C1  Number of measurements: 10  Hepatic segment:  8  Median kPa: 2.8  IQR: 0.6  IQR/Median kPa ratio: 0.2  Data quality:  Good  Diagnostic category:  < or = 5 kPa: high probability of being normal  IMPRESSION: ULTRASOUND ABDOMEN:  Probable fatty infiltration of liver as above.  Post cholecystectomy.  Incomplete pancreatic visualization.  ULTRASOUND HEPATIC ELASTOGRAPHY:  Median kPa: 2.8 kPa  Diagnostic category:  < or = 5 kPa: high probability of being normal  The use of hepatic elastography is applicable to patients with viral hepatitis and non-alcoholic fatty liver disease. At this time, there is insufficient data for the referenced cut-off values and use in other causes of liver disease, including alcoholic liver disease. Patients, however, may be assessed by elastography and serve as their own reference standard/baseline.  In patients with non-alcoholic liver disease, the values suggesting compensated advanced chronic  liver disease (cACLD) may be lower, and patients may need additional testing with elasticity results of 7-9 kPa.  Please note that abnormal hepatic elasticity and shear wave velocities may also be identified in clinical settings other than with hepatic fibrosis, such as: acute hepatitis, elevated right heart and central venous pressures including use of beta blockers, veno-occlusive disease ([place]), infiltrative processes such as mastocytosis/amyloidosis/infiltrative tumor/lymphoma, extrahepatic cholestasis, with hyperemia in the post-prandial state, and with liver transplantation. Correlation with patient history, laboratory data, and clinical condition recommended.  Diagnostic Categories:  < or =5 kPa: high probability of being normal  < or =9 kPa: in the absence of other known clinical signs, rules out cACLD  >9 kPa and ?13 kPa: suggestive of cACLD, but needs further testing  >13 kPa: highly suggestive of cACLD  > or =17 kPa: highly suggestive of cACLD with an increased probability of clinically significant portal hypertension  Electronically Signed   By: Lavonia Dana M.D.   On: 05/18/2019 12:37 Note: Reviewed        Physical Exam  General appearance: Well nourished, well developed, and well hydrated. In no apparent acute distress Mental status: Alert, oriented x 3 (person, place, & time)       Respiratory: No evidence of acute respiratory distress Eyes: PERLA Vitals: BP 121/72   Pulse (!) 104   Temp 98 F (36.7 C)   Resp 18   Ht _0  (1.499 m)   Wt 131 lb (59.4 kg)   LMP  (LMP Unknown) Comment: "many years"  SpO2 94%   BMI 26.46 kg/m  BMI: Estimated body mass index is 26.46 kg/m as calculated from the following:   Height as of this encounter: _1  (1.499 m).   Weight as of this encounter: 131 lb (59.4 kg). Ideal: Patient must be at least 60 in tall to calculate ideal body weight  Lumbar Spine Area Exam  Skin & Axial Inspection: No masses, redness,  or swelling Alignment: Symmetrical Functional ROM: Pain restricted ROM       Stability: No instability detected Muscle Tone/Strength: Functionally intact. No obvious neuro-muscular anomalies detected. Sensory (Neurological): Dermatomal pain pattern  Gait & Posture Assessment  Ambulation: Unassisted Gait: Relatively normal for age and body habitus Posture: WNL   Lower Extremity Exam    Side: Right lower extremity  Side: Left lower extremity  Stability: No instability observed          Stability: No instability observed          Skin & Extremity Inspection: Skin color, temperature, and hair growth are WNL. No peripheral edema or cyanosis. No masses, redness, swelling, asymmetry, or associated skin lesions. No contractures.  Skin & Extremity Inspection: Skin color, temperature, and hair growth are WNL. No peripheral edema or cyanosis. No masses, redness, swelling, asymmetry, or associated skin lesions. No contractures.  Functional ROM: Pain restricted ROM for hip and knee joints          Functional ROM: Pain restricted ROM  for hip and knee joints          Muscle Tone/Strength: Functionally intact. No obvious neuro-muscular anomalies detected.  Muscle Tone/Strength: Functionally intact. No obvious neuro-muscular anomalies detected.  Sensory (Neurological): Neuropathic pain pattern        Sensory (Neurological): Neuropathic pain pattern        DTR: Patellar: deferred today Achilles: deferred today Plantar: deferred today  DTR: Patellar: deferred today Achilles: deferred today Plantar: deferred today  Palpation: No palpable anomalies  Palpation: No palpable anomalies    Assessment   Status Diagnosis  Controlled Controlled Controlled 1. Lumbar radiculopathy   2. Chronic pain syndrome   3. Chronic myofascial pain   4. Pain in both hands   5. Disorder of skeletal system   6. Long term prescription opiate use   7. Chronic, continuous use of opioids      Updated Problems: Problem   Pain in Both Hands  Lumbar Radiculopathy  Chronic Bilateral Low Back Pain With Bilateral Sciatica  Chronic Pain Syndrome  Chronic, Continuous Use of Opioids  Chronic Myofascial Pain  Primary Osteoarthritis of Both Hips  Type 2 Diabetes Mellitus Without Complications ([facility])  Fibromyalgia    Plan of Care  Ms. Montanna Mcbain Kitzmiller has a current medication list which includes the following long-term medication(s): albuterol, budesonide-formoterol, dexlansoprazole, famotidine, fexofenadine, insulin regular, linaclotide, losartan, metformin, mirtazapine, pregabalin, rizatriptan, topiramate, trazodone, venlafaxine xr, and venlafaxine.  Pharmacotherapy (Medications Ordered): Meds ordered this encounter  Medications  . HYDROcodone-acetaminophen (NORCO) 7.5-325 MG tablet    Sig: Take 1 tablet by mouth every 6 (six) hours as needed for severe pain. Must last 30 days.    Dispense:  90 tablet    Refill:  0    Chronic Pain. (STOP Act - Not applicable). Fill one day early if closed on scheduled refill date.  Marland Kitchen HYDROcodone-acetaminophen (NORCO) 7.5-325 MG tablet    Sig: Take 1 tablet by mouth every 6 (six) hours as needed for severe pain. Must last 30 days.    Dispense:  90 tablet    Refill:  0    Chronic Pain. (STOP Act - Not applicable). Fill one day early if closed on scheduled refill date.  . pregabalin (LYRICA) 50 MG capsule    Sig: 50 mg qday, 100 mg qhs    Dispense:  90 capsule    Refill:  1   Follow-up plan:   Return in about 11 weeks (around 12/08/2019) for Medication Management, in person.   Recent Visits Date Type Provider Dept  09/06/19 Office Visit Gillis Santa, MD [facility]  07/26/19 Telemedicine Gillis Santa, MD [facility]  Showing recent visits within past 90 days and meeting all other requirements Today's Visits Date Type Provider Dept  09/22/19 Office Visit Gillis Santa, MD [facility]  Showing today's visits and meeting all other  requirements Future Appointments Date Type Provider Dept  12/08/19 Appointment Gillis Santa, MD [facility]  Showing future appointments within next 90 days and meeting all other requirements  I discussed the assessment and treatment plan with the patient. The patient was provided an opportunity to ask questions and all were answered. The patient agreed with the plan and demonstrated an understanding of the instructions.  Patient advised to call back or seek an in-person evaluation if the symptoms or condition worsens.  Duration of encounter: 30 minutes.  Note by: Gillis Santa, MD Date: 09/22/2019; Time: 2:07 PM

## 2019-09-22 NOTE — Progress Notes (Signed)
Nursing Pain Medication Assessment:  Safety precautions to be maintained throughout the outpatient stay will include: orient to surroundings, keep bed in low position, maintain call bell within reach at all times, provide assistance with transfer out of bed and ambulation.  Medication Inspection Compliance: Pill count conducted under aseptic conditions, in front of the patient. Neither the pills nor the bottle was removed from the patient's sight at any time. Once count was completed pills were immediately returned to the patient in their original bottle.  Medication: Hydrocodone/APAP Pill/Patch Count: 72 of 90 pills remain Pill/Patch Appearance: Markings consistent with prescribed medication Bottle Appearance: Standard pharmacy container. Clearly labeled. Filled Date:06 / 21 / 2021 Last Medication intake:  Today

## 2019-09-23 NOTE — Discharge Instructions (Signed)

## 2019-09-28 ENCOUNTER — Encounter
Admission: RE | Disposition: A | Discharge status: Home / Self Care | Payer: Self-pay | Source: Home / Self Care | Attending: Ophthalmology

## 2019-09-28 ENCOUNTER — Ambulatory Visit: Payer: Medicaid Other | Admitting: Anesthesiology

## 2019-09-28 ENCOUNTER — Encounter: Payer: Self-pay | Admitting: Ophthalmology

## 2019-09-28 ENCOUNTER — Encounter: Payer: Medicaid Other | Admitting: Student in an Organized Health Care Education/Training Program

## 2019-09-28 ENCOUNTER — Ambulatory Visit
Admission: RE | Admit: 2019-09-28 | Discharge: 2019-09-28 | Disposition: A | Discharge status: Home / Self Care | Payer: Medicaid Other | Attending: Ophthalmology | Admitting: Ophthalmology

## 2019-09-28 ENCOUNTER — Other Ambulatory Visit: Payer: Self-pay

## 2019-09-28 DIAGNOSIS — K219 Gastro-esophageal reflux disease without esophagitis: Secondary | ICD-10-CM | POA: Diagnosis not present

## 2019-09-28 DIAGNOSIS — Z888 Allergy status to other drugs, medicaments and biological substances status: Secondary | ICD-10-CM | POA: Insufficient documentation

## 2019-09-28 DIAGNOSIS — E1136 Type 2 diabetes mellitus with diabetic cataract: Secondary | ICD-10-CM | POA: Insufficient documentation

## 2019-09-28 DIAGNOSIS — M1990 Unspecified osteoarthritis, unspecified site: Secondary | ICD-10-CM | POA: Diagnosis not present

## 2019-09-28 DIAGNOSIS — H2511 Age-related nuclear cataract, right eye: Secondary | ICD-10-CM | POA: Diagnosis present

## 2019-09-28 DIAGNOSIS — K760 Fatty (change of) liver, not elsewhere classified: Secondary | ICD-10-CM | POA: Diagnosis not present

## 2019-09-28 DIAGNOSIS — R011 Cardiac murmur, unspecified: Secondary | ICD-10-CM | POA: Insufficient documentation

## 2019-09-28 DIAGNOSIS — Z90710 Acquired absence of both cervix and uterus: Secondary | ICD-10-CM | POA: Insufficient documentation

## 2019-09-28 DIAGNOSIS — E7800 Pure hypercholesterolemia, unspecified: Secondary | ICD-10-CM | POA: Insufficient documentation

## 2019-09-28 DIAGNOSIS — R519 Headache, unspecified: Secondary | ICD-10-CM | POA: Diagnosis not present

## 2019-09-28 DIAGNOSIS — M5416 Radiculopathy, lumbar region: Secondary | ICD-10-CM | POA: Diagnosis not present

## 2019-09-28 DIAGNOSIS — M5430 Sciatica, unspecified side: Secondary | ICD-10-CM | POA: Diagnosis not present

## 2019-09-28 DIAGNOSIS — G4730 Sleep apnea, unspecified: Secondary | ICD-10-CM | POA: Diagnosis not present

## 2019-09-28 DIAGNOSIS — F329 Major depressive disorder, single episode, unspecified: Secondary | ICD-10-CM | POA: Insufficient documentation

## 2019-09-28 DIAGNOSIS — K5790 Diverticulosis of intestine, part unspecified, without perforation or abscess without bleeding: Secondary | ICD-10-CM | POA: Insufficient documentation

## 2019-09-28 DIAGNOSIS — Z9049 Acquired absence of other specified parts of digestive tract: Secondary | ICD-10-CM | POA: Insufficient documentation

## 2019-09-28 DIAGNOSIS — Z9842 Cataract extraction status, left eye: Secondary | ICD-10-CM | POA: Diagnosis not present

## 2019-09-28 DIAGNOSIS — R0609 Other forms of dyspnea: Secondary | ICD-10-CM | POA: Insufficient documentation

## 2019-09-28 DIAGNOSIS — J449 Chronic obstructive pulmonary disease, unspecified: Secondary | ICD-10-CM | POA: Diagnosis not present

## 2019-09-28 DIAGNOSIS — M797 Fibromyalgia: Secondary | ICD-10-CM | POA: Diagnosis not present

## 2019-09-28 DIAGNOSIS — E785 Hyperlipidemia, unspecified: Secondary | ICD-10-CM | POA: Insufficient documentation

## 2019-09-28 DIAGNOSIS — I10 Essential (primary) hypertension: Secondary | ICD-10-CM | POA: Insufficient documentation

## 2019-09-28 HISTORY — PX: CATARACT EXTRACTION W/PHACO: SHX586

## 2019-09-28 LAB — GLUCOSE, CAPILLARY
Glucose-Capillary: 267 mg/dL — ABNORMAL HIGH (ref 70–99)
Glucose-Capillary: 242 mg/dL — ABNORMAL HIGH (ref 70–99)

## 2019-09-28 SURGERY — PHACOEMULSIFICATION, CATARACT, WITH IOL INSERTION
Anesthesia: Monitor Anesthesia Care | Site: Eye | Laterality: Right

## 2019-09-28 MED ORDER — MIDAZOLAM HCL 2 MG/2ML IJ SOLN
INTRAMUSCULAR | Status: DC | PRN
  Administered 2019-09-28: 1 mg via INTRAVENOUS
  Administered 2019-09-28: .5 mg via INTRAVENOUS

## 2019-09-28 MED ORDER — ONDANSETRON HCL 4 MG/2ML IJ SOLN: 4.0000 mg | Freq: Once | INTRAMUSCULAR | Status: DC | PRN

## 2019-09-28 MED ORDER — NA CHONDROIT SULF-NA HYALURON 40-17 MG/ML IO SOLN
INTRAOCULAR | Status: DC | PRN
  Administered 2019-09-28: 1 mL via INTRAOCULAR

## 2019-09-28 MED ORDER — LIDOCAINE HCL (PF) 2 % IJ SOLN
INTRAOCULAR | Status: DC | PRN
  Administered 2019-09-28: 1 mL

## 2019-09-28 MED ORDER — BRIMONIDINE TARTRATE-TIMOLOL 0.2-0.5 % OP SOLN
OPHTHALMIC | Status: DC | PRN
  Administered 2019-09-28: 1 [drp] via OPHTHALMIC

## 2019-09-28 MED ORDER — EPINEPHRINE PF 1 MG/ML IJ SOLN
INTRAOCULAR | Status: DC | PRN
  Administered 2019-09-28: 48 mL via OPHTHALMIC

## 2019-09-28 MED ORDER — MOXIFLOXACIN HCL 0.5 % OP SOLN
OPHTHALMIC | Status: DC | PRN
  Administered 2019-09-28: 0.2 mL via OPHTHALMIC

## 2019-09-28 MED ORDER — FENTANYL CITRATE (PF) 100 MCG/2ML IJ SOLN
INTRAMUSCULAR | Status: DC | PRN
  Administered 2019-09-28: 25 ug via INTRAVENOUS
  Administered 2019-09-28: 50 ug via INTRAVENOUS

## 2019-09-28 MED ORDER — TETRACAINE HCL 0.5 % OP SOLN
1.0000 [drp] | OPHTHALMIC | Status: DC | PRN
  Administered 2019-09-28 (×3): 1 [drp] via OPHTHALMIC

## 2019-09-28 MED ORDER — INSULIN LISPRO 100 UNIT/ML ~~LOC~~ SOLN
2.0000 [IU] | Freq: Once | SUBCUTANEOUS | Status: AC
  Administered 2019-09-28: 2 [IU] via SUBCUTANEOUS

## 2019-09-28 MED ORDER — LACTATED RINGERS IV SOLN: INTRAVENOUS | Status: DC

## 2019-09-28 MED ORDER — ARMC OPHTHALMIC DILATING DROPS
1.0000 "application " | OPHTHALMIC | Status: DC | PRN
  Administered 2019-09-28 (×3): 1 via OPHTHALMIC

## 2019-09-28 MED ORDER — ACETAMINOPHEN 10 MG/ML IV SOLN: 1000.0000 mg | Freq: Once | INTRAVENOUS | Status: DC | PRN

## 2019-09-28 SURGICAL SUPPLY — 20 items
CANNULA ANT/CHMB 27G (MISCELLANEOUS) ×2 IMPLANT
CANNULA ANT/CHMB 27GA (MISCELLANEOUS) ×6 IMPLANT
GLOVE SURG LX 8.0 MICRO (GLOVE) ×2
GLOVE SURG LX STRL 8.0 MICRO (GLOVE) ×1 IMPLANT
GLOVE SURG TRIUMPH 8.0 PF LTX (GLOVE) ×3 IMPLANT
GOWN STRL REUS W/ TWL LRG LVL3 (GOWN DISPOSABLE) ×2 IMPLANT
GOWN STRL REUS W/TWL LRG LVL3 (GOWN DISPOSABLE) ×6
LENS IOL DIOP 22.5 (Intraocular Lens) ×3 IMPLANT
LENS IOL TECNIS MONO 22.5 (Intraocular Lens) IMPLANT
MARKER SKIN DUAL TIP RULER LAB (MISCELLANEOUS) ×3 IMPLANT
NDL FILTER BLUNT 18X1 1/2 (NEEDLE) ×1 IMPLANT
NEEDLE FILTER BLUNT 18X 1/2SAF (NEEDLE) ×2
NEEDLE FILTER BLUNT 18X1 1/2 (NEEDLE) ×1 IMPLANT
PACK EYE AFTER SURG (MISCELLANEOUS) ×3 IMPLANT
PACK OPTHALMIC (MISCELLANEOUS) ×3 IMPLANT
PACK PORFILIO (MISCELLANEOUS) ×3 IMPLANT
SYR 3ML LL SCALE MARK (SYRINGE) ×3 IMPLANT
SYR TB 1ML LUER SLIP (SYRINGE) ×3 IMPLANT
WATER STERILE IRR 250ML POUR (IV SOLUTION) ×3 IMPLANT
WIPE NON LINTING 3.25X3.25 (MISCELLANEOUS) ×3 IMPLANT

## 2019-09-28 NOTE — Op Note (Signed)
PREOPERATIVE DIAGNOSIS:  Nuclear sclerotic cataract of the right eye.   POSTOPERATIVE DIAGNOSIS:  H25.11 Cataract   OPERATIVE PROCEDURE:@   SURGEON:  Galen Manila, MD.   ANESTHESIA:  Anesthesiologist: Heniser, Burman Foster, MD CRNA: Jiles Garter, CRNA  1.      Managed anesthesia care. 2.      0.73ml of Shugarcaine was instilled in the eye following the paracentesis.   COMPLICATIONS:  None.   TECHNIQUE:   Stop and chop   DESCRIPTION OF PROCEDURE:  The patient was examined and consented in the preoperative holding area where the aforementioned topical anesthesia was applied to the right eye and then brought back to the Operating Room where the right eye was prepped and draped in the usual sterile ophthalmic fashion and a lid speculum was placed. A paracentesis was created with the side port blade and the anterior chamber was filled with viscoelastic. A near clear corneal incision was performed with the steel keratome. A continuous curvilinear capsulorrhexis was performed with a cystotome followed by the capsulorrhexis forceps. Hydrodissection and hydrodelineation were carried out with BSS on a blunt cannula. The lens was removed in a stop and chop  technique and the remaining cortical material was removed with the irrigation-aspiration handpiece. The capsular bag was inflated with viscoelastic and the Technis [id number]  lens was placed in the capsular bag without complication. The remaining viscoelastic was removed from the eye with the irrigation-aspiration handpiece. The wounds were hydrated. The anterior chamber was flushed with BSS and the eye was inflated to physiologic pressure. 0.85ml of Vigamox was placed in the anterior chamber. The wounds were found to be water tight. The eye was dressed with Combigan. The patient was given protective glasses to wear throughout the day and a shield with which to sleep tonight. The patient was also given drops with which to begin a drop regimen today and will  follow-up with me in one day. Implant Name Type Inv. Item Serial No. Manufacturer Lot No. LRB No. Used Action  LENS IOL DIOP 22.5 - [id number] Intraocular Lens LENS IOL DIOP 22.5 [id number] [employer]  Right 1 Implanted   Procedure(s) with comments: CATARACT EXTRACTION PHACO AND INTRAOCULAR LENS PLACEMENT (IOC) RIGHT DIABETIC 4.16  00:43.3 (Right) - Diabetic - insulin and oral meds  Electronically signed: Galen Manila 09/28/2019 8:14 AM

## 2019-09-28 NOTE — Anesthesia Preprocedure Evaluation (Signed)
Anesthesia Evaluation  Patient identified by MRN, date of birth, ID band Patient awake    Reviewed: Allergy & Precautions, NPO status , Patient's Chart, lab work & pertinent test results, reviewed documented beta blocker date and time   History of Anesthesia Complications Negative for: history of anesthetic complications  Airway Mallampati: III  TM Distance: >3 FB Neck ROM: Full    Dental  (+) Upper Dentures, Poor Dentition   Pulmonary asthma , sleep apnea , COPD, Current SmokerPatient did not abstain from smoking.,    breath sounds clear to auscultation       Cardiovascular Exercise Tolerance: Poor hypertension, (-) angina+ DOE (Chronic, stable)   Rhythm:Regular Rate:Normal   HLD   Neuro/Psych  Headaches, PSYCHIATRIC DISORDERS Depression  Neuromuscular disease (Lumbar radiculopathy, sciatica)    GI/Hepatic GERD  Controlled and Medicated, Fatty liver  Diverticulosis   Endo/Other  diabetes  Renal/GU      Musculoskeletal  (+) Arthritis , Osteoarthritis,  Fibromyalgia -  Abdominal   Peds  Hematology   Anesthesia Other Findings   Reproductive/Obstetrics                             Anesthesia Physical  Anesthesia Plan  ASA: III  Anesthesia Plan: MAC   Post-op Pain Management:    Induction: Intravenous  PONV Risk Score and Plan: 1 and TIVA, Midazolam and Treatment may vary due to age or medical condition  Airway Management Planned: Nasal Cannula  Additional Equipment:   Intra-op Plan:   Post-operative Plan:   Informed Consent: I have reviewed the patients History and Physical, chart, labs and discussed the procedure including the risks, benefits and alternatives for the proposed anesthesia with the patient or authorized representative who has indicated his/her understanding and acceptance.       Plan Discussed with: CRNA and Anesthesiologist  Anesthesia Plan Comments:          Anesthesia Quick Evaluation

## 2019-09-28 NOTE — Anesthesia Postprocedure Evaluation (Signed)
Anesthesia Post Note  Patient: [name]  Procedure(s) Performed: CATARACT EXTRACTION PHACO AND INTRAOCULAR LENS PLACEMENT (IOC) RIGHT DIABETIC 4.16  00:43.3 (Right Eye)     Patient location during evaluation: PACU Anesthesia Type: MAC Level of consciousness: awake and alert Pain management: pain level controlled Vital Signs Assessment: post-procedure vital signs reviewed and stable Respiratory status: spontaneous breathing, nonlabored ventilation, respiratory function stable and patient connected to nasal cannula oxygen Cardiovascular status: stable and blood pressure returned to baseline Postop Assessment: no apparent nausea or vomiting Anesthetic complications: no   No complications documented.  Rivaldo Hineman A  Gloris Shiroma

## 2019-09-28 NOTE — H&P (Signed)
All labs reviewed. Abnormal studies sent to patients PCP when indicated.  Previous H&P reviewed, patient examined, there are NO CHANGES.  Erika Wiland Porfilio7/6/20217:51 AM

## 2019-09-28 NOTE — Anesthesia Procedure Notes (Signed)
Procedure Name: MAC Date/Time: 09/28/2019 7:59 AM Performed by: Vanetta Shawl, CRNA Pre-anesthesia Checklist: Patient identified, Emergency Drugs available, Suction available, Timeout performed and Patient being monitored Patient Re-evaluated:Patient Re-evaluated prior to induction Oxygen Delivery Method: Nasal cannula Placement Confirmation: positive ETCO2

## 2019-09-28 NOTE — Transfer of Care (Signed)
Immediate Anesthesia Transfer of Care Note  Patient: [name]  Procedure(s) Performed: CATARACT EXTRACTION PHACO AND INTRAOCULAR LENS PLACEMENT (IOC) RIGHT DIABETIC 4.16  00:43.3 (Right Eye)  Patient Location: PACU  Anesthesia Type: MAC  Level of Consciousness: awake, alert  and patient cooperative  Airway and Oxygen Therapy: Patient Spontanous Breathing   Post-op Assessment: Post-op Vital signs reviewed, Patient's Cardiovascular Status Stable, Respiratory Function Stable, Patent Airway and No signs of Nausea or vomiting  Post-op Vital Signs: Reviewed and stable  Complications: No complications documented.

## 2019-09-29 ENCOUNTER — Encounter: Payer: Self-pay | Admitting: Ophthalmology

## 2019-10-13 ENCOUNTER — Telehealth: Payer: Self-pay | Admitting: Student in an Organized Health Care Education/Training Program

## 2019-10-13 NOTE — Telephone Encounter (Signed)
Patient states she is having constipation really bad, has tried otc and not helping, is there anything that can be called in?

## 2019-10-13 NOTE — Telephone Encounter (Signed)
Please advise 

## 2019-10-13 NOTE — Telephone Encounter (Signed)
Patient advised of this. 

## 2019-10-13 NOTE — Telephone Encounter (Signed)
Patient can consider MiraLAX or magnesium citrate that she can pick up from drugstore.

## 2019-12-06 ENCOUNTER — Encounter: Payer: Self-pay | Admitting: Student in an Organized Health Care Education/Training Program

## 2019-12-07 ENCOUNTER — Ambulatory Visit
Payer: Medicaid Other | Attending: Student in an Organized Health Care Education/Training Program | Admitting: Student in an Organized Health Care Education/Training Program

## 2019-12-07 ENCOUNTER — Other Ambulatory Visit: Payer: Self-pay

## 2019-12-07 ENCOUNTER — Encounter: Payer: Self-pay | Admitting: Student in an Organized Health Care Education/Training Program

## 2019-12-07 DIAGNOSIS — M5416 Radiculopathy, lumbar region: Secondary | ICD-10-CM

## 2019-12-07 DIAGNOSIS — G894 Chronic pain syndrome: Secondary | ICD-10-CM

## 2019-12-07 DIAGNOSIS — M7918 Myalgia, other site: Secondary | ICD-10-CM

## 2019-12-07 DIAGNOSIS — F1190 Opioid use, unspecified, uncomplicated: Secondary | ICD-10-CM

## 2019-12-07 DIAGNOSIS — M797 Fibromyalgia: Secondary | ICD-10-CM

## 2019-12-07 DIAGNOSIS — Z79899 Other long term (current) drug therapy: Secondary | ICD-10-CM

## 2019-12-07 DIAGNOSIS — G8929 Other chronic pain: Secondary | ICD-10-CM

## 2019-12-07 MED ORDER — HYDROCODONE-ACETAMINOPHEN 7.5-325 MG PO TABS: 1.0000 | ORAL_TABLET | Freq: Three times a day (TID) | ORAL | 0 refills | Status: AC | PRN

## 2019-12-07 MED ORDER — PREGABALIN 150 MG PO CAPS: 150.0000 mg | ORAL_CAPSULE | Freq: Two times a day (BID) | ORAL | 2 refills | Status: DC

## 2019-12-07 MED ORDER — HYDROCODONE-ACETAMINOPHEN 7.5-325 MG PO TABS: 1.0000 | ORAL_TABLET | Freq: Three times a day (TID) | ORAL | 0 refills | Status: DC | PRN

## 2019-12-07 NOTE — Progress Notes (Signed)
Patient: [name]  Service Category: E/M  Provider: Gillis Santa, MD  DOB: 1960-12-19  DOS: 12/07/2019  Location: Office  MRN: [id number]  Setting: Ambulatory outpatient  Referring Provider: Sharyne Peach, MD  Type: Established Patient  Specialty: Interventional Pain Management  PCP: Sharyne Peach, MD  Location: Home  Delivery: TeleHealth     Virtual Encounter - Pain Management PROVIDER NOTE: Information contained herein reflects review and annotations entered in association with encounter. Interpretation of such information and data should be left to medically-trained personnel. Information provided to patient can be located elsewhere in the medical record under "Patient Instructions". Document created using STT-dictation technology, any transcriptional errors that may result from process are unintentional.    Contact & Pharmacy Preferred: [phone] Home: [phone] (home) Mobile: There is no such number on file (mobile). E-mail: No e-mail address on record  [id number] [address] [place]Phone: [phone]: [phone]  Pre-screening  [name] offered "in-person" vs "virtual" encounter. She indicated preferring virtual for this encounter.   Reason COVID-19*  Social distancing based on CDC and AMA recommendations.   I contacted JMariem SkolnickClinkscales on 12/07/2019 via video conference.      I clearly identified myself as BGillis Santa MD. I verified that I was speaking with the correct person using two identifiers (Name: JPALMER SHOREY and date of birth: 590-02-1961.  Consent I sought verbal advanced consent from JWheatlandfor virtual visit interactions. I informed [name] of possible security and privacy concerns, risks, and limitations associated with providing "not-in-person" medical evaluation and management services. I also informed [name] of the  availability of "in-person" appointments. Finally, I informed her that there would be a charge for the virtual visit and that she could be  personally, fully or partially, financially responsible for it. Ms. CGoodgameexpressed understanding and agreed to proceed.   Historic Elements   Ms. JHAILEY STORMERis a 59y.o. year old, female patient evaluated today after our last contact on 10/13/2019. [name] has a past medical history of Asthma, Asthma, BPPV (benign paroxysmal positional vertigo), Cardiac murmur, unspecified (08/28/2016), CPAP (continuous positive airway pressure) dependence, Degenerative disc disease, lumbar, Depression, Diabetes mellitus, Diverticulitis (10/2016), Fatty liver, Fibromyalgia, Fibromyalgia, GERD (gastroesophageal reflux disease), Headache, History of degenerative disc disease, Hyperlipidemia, Hypertension, and Sleep apnea. She also  has a past surgical history that includes Abdominal surgery; Abdominal hysterectomy; Esophagogastroduodenoscopy (egd) with propofol (N/A, 02/20/2017); Colonoscopy with propofol (N/A, 02/20/2017); Upper esophageal endoscopic ultrasound (eus) (N/A, 04/03/2017); Diverticulitis; EUS (N/A, 01/08/2018); Cholecystectomy (N/A, 04/27/2018); Cataract extraction w/PHACO (Left, 09/07/2019); and Cataract extraction w/PHACO (Right, 09/28/2019). Ms. CCryderhas a current medication list which includes the following prescription(s): accu-chek guide, albuterol, aspirin, budesonide-formoterol, dexlansoprazole, estradiol, famotidine, fesoterodine, fexofenadine, fluticasone, [START ON 12/11/2019] hydrocodone-acetaminophen, [START ON 01/10/2020] hydrocodone-acetaminophen, [START ON 02/09/2020] hydrocodone-acetaminophen, insulin regular, levemir flextouch, linaclotide, losartan, metformin, mirtazapine, alive once daily womens, ondansetron, polyethylene glycol, rizatriptan, rosuvastatin, scopolamine, solifenacin, topiramate, trazodone, venlafaxine xr, pregabalin, and  venlafaxine. She  reports that she has been smoking cigarettes. She has a 7.00 pack-year smoking history. She has quit using smokeless tobacco. She reports current alcohol use of about 1.0 standard drink of alcohol per week. She reports previous drug use. Ms. CPippenis allergic to tramadol, benadryl [diphenhydramine hcl], bextra [valdecoxib], invokana [canagliflozin], and sulfamethoxazole-trimethoprim.   HPI  Today, she is being contacted for medication management.   Had exposure to  to COVID-19.  Patient's daughter was diagnosed with it.  Patient tested negative for Covid.  She states that she did have mild symptoms even though she had a negative test.  For this reason we are conducting a virtual visit for medication management.  No change in medical history otherwise.  Continues multimodal pain regimen as below.  States that she is taking Lyrica 150 mg in the morning and sometimes takes 50 mg to 100 mg at night.  No side effects with this dose.  Discussed increasing her Lyrica to 150 mg twice daily given how she is currently taking it.  Risks and benefits reviewed.  Patient's kidney function within normal limits.  We will refill hydrocodone as below, no change in dose for the next 3 months.  Pharmacotherapy Assessment  Analgesic: Norco 7.5 mg TID PRN #90/month   Monitoring: [place] PMP: PDMP reviewed during this encounter.       Pharmacotherapy: No side-effects or adverse reactions reported. Compliance: No problems identified. Effectiveness: Clinically acceptable. Plan: Refer to "POC".  11/18/2019  2   09/22/2019  Pregabalin 50 MG Capsule  90.00  30 Bi Lat   [id number]   [facility] (0252)   1/1  1.00 LME  Medicaid   [place]  11/11/2019  2   09/22/2019  Hydrocodone-Acetamin 7.5-325  90.00  22 Bi Lat   [id number]   [facility] (0252)   0/0  30.68 MME  Medicaid   [place]     UDS:  Summary  Date Value Ref Range Status  02/24/2019 Note  Final    Comment:     ==================================================================== [facility] Select 13 (MW) ==================================================================== Test                             Result       Flag       Units Drug Present and Declared for Prescription Verification   Hydrocodone                    990          EXPECTED   ng/mg creat   Hydromorphone                  183          EXPECTED   ng/mg creat   Dihydrocodeine                 210          EXPECTED   ng/mg creat   Norhydrocodone                 1496         EXPECTED   ng/mg creat    Sources of hydrocodone include scheduled prescription medications.    Hydromorphone, dihydrocodeine and norhydrocodone are expected    metabolites of hydrocodone. Hydromorphone and dihydrocodeine are    also available as scheduled prescription medications. Drug Present not Declared for Prescription Verification   Alcohol, Ethyl                 0.072        UNEXPECTED g/dL    Sources of ethyl alcohol include alcoholic beverages or as a    fermentation product of glucose; glucose is present in this specimen.    Interpret result with caution, as the presence of ethyl alcohol is    likely due, at least in part, to fermentation of glucose. ==================================================================== Test  Result    Flag   Units      Ref Range   Creatinine              71               mg/dL      >=20 ==================================================================== Declared Medications:  The flagging and interpretation on this report are based on the  following declared medications.  Unexpected results may arise from  inaccuracies in the declared medications.  **Note: The testing scope of this panel includes these medications:  Hydrocodone  **Note: The testing scope of this panel does not include the  following reported medications:  Acetaminophen  Albuterol  Aspirin  Budesonide (Symbicort)  Cetirizine  (Zyrtec)  Dexlansoprazole  Famotidine (Pepcid)  Fexofenadine (Allegra)  Formoterol (Symbicort)  Hyoscyamine  Insulin  Linaclotide (Linzess)  Lisinopril  Losartan (Cozaar)  Meclizine  Metformin  Mirtazapine (Remeron)  Multivitamin  Naproxen  Ondansetron  Polyethylene Glycol  Pregabalin  Rizatriptan (Maxalt)  Rosuvastatin (Crestor)  Solifenacin (Vesicare)  Topical  Topiramate  Trazodone ==================================================================== For clinical consultation, please call [phone]. ====================================================================     Laboratory Chemistry Profile   Renal Lab Results  Component Value Date   BUN 15 08/09/2019   CREATININE 0.59 08/09/2019   BCR 14 11/27/2017   GFRAA >60 08/09/2019   GFRNONAA >60 08/09/2019     Hepatic Lab Results  Component Value Date   AST 25 08/09/2019   ALT 36 08/09/2019   ALBUMIN 3.9 08/09/2019   ALKPHOS 120 08/09/2019   LIPASE 19 08/09/2019     Electrolytes Lab Results  Component Value Date   NA 138 08/09/2019   K 4.2 08/09/2019   CL 108 08/09/2019   CALCIUM 8.8 (L) 08/09/2019   MG 1.8 11/27/2017     Bone No results found for: VD25OH, VD125OH2TOT, RK2706CB7, [id number], 25OHVITD1, 25OHVITD2, 25OHVITD3, TESTOFREE, TESTOSTERONE   Inflammation (CRP: Acute Phase) (ESR: Chronic Phase) Lab Results  Component Value Date   CRP <1 11/27/2017   ESRSEDRATE 17 11/27/2017   LATICACIDVEN 1.6 10/14/2016       Note: Above Lab results reviewed.  Imaging  US ABDOMEN COMPLETE W/ELASTOGRAPHY CLINICAL DATA:  Hepatic steatosis  EXAM: ULTRASOUND ABDOMEN  ULTRASOUND HEPATIC ELASTOGRAPHY  TECHNIQUE: Sonography of the upper abdomen was performed. In addition, ultrasound elastography evaluation of the liver was performed. A region of interest was placed within the right lobe of the liver. Following application of a compressive sonographic pulse, tissue compressibility was  assessed. Multiple assessments were performed at the selected site. Median tissue compressibility was determined. Previously, hepatic stiffness was assessed by shear wave velocity. Based on recently published Society of Radiologists in Ultrasound consensus article, reporting is now recommended to be performed in the SI units of pressure (kiloPascals) representing hepatic stiffness/elasticity. The obtained result is compared to the published reference standards. (cACLD = compensated Advanced Chronic Liver Disease)  COMPARISON:  Ultrasound abdomen 03/16/2018  FINDINGS: ULTRASOUND ABDOMEN  Gallbladder: Surgically absent  Common bile duct: Diameter: 6 mm, normal  Liver: Echogenic parenchyma, likely fatty infiltration though this can be seen with cirrhosis and certain infiltrative disorders. No focal hepatic mass or nodularity grossly identified, though assessment of deep intrahepatic anatomy is limited by sound attenuation. Portal vein is patent on color Doppler imaging with normal direction of blood flow towards the liver.  IVC: Normal appearance  Pancreas: Portions obscured by bowel gas. Visualized portions of pancreatic body and tail normal appearance.  Spleen: Normal appearance, 6.9 cm length  Right  Kidney: Length: 10.3 cm. Small extrarenal pelvis. No gross mass or hydronephrosis.  Left Kidney: Length: 11.1 cm. Normal morphology without mass or hydronephrosis.  Abdominal aorta: Normal caliber  Other findings: No free fluid  ULTRASOUND HEPATIC ELASTOGRAPHY  Device: Siemens Helix VTQ  Patient position: Supine  Transducer 5C1  Number of measurements: 10  Hepatic segment:  8  Median kPa: 2.8  IQR: 0.6  IQR/Median kPa ratio: 0.2  Data quality:  Good  Diagnostic category:  < or = 5 kPa: high probability of being normal  IMPRESSION: ULTRASOUND ABDOMEN:  Probable fatty infiltration of liver as above.  Post cholecystectomy.  Incomplete pancreatic  visualization.  ULTRASOUND HEPATIC ELASTOGRAPHY:  Median kPa: 2.8 kPa  Diagnostic category:  < or = 5 kPa: high probability of being normal  The use of hepatic elastography is applicable to patients with viral hepatitis and non-alcoholic fatty liver disease. At this time, there is insufficient data for the referenced cut-off values and use in other causes of liver disease, including alcoholic liver disease. Patients, however, may be assessed by elastography and serve as their own reference standard/baseline.  In patients with non-alcoholic liver disease, the values suggesting compensated advanced chronic liver disease (cACLD) may be lower, and patients may need additional testing with elasticity results of 7-9 kPa.  Please note that abnormal hepatic elasticity and shear wave velocities may also be identified in clinical settings other than with hepatic fibrosis, such as: acute hepatitis, elevated right heart and central venous pressures including use of beta blockers, veno-occlusive disease (Budd-Chiari), infiltrative processes such as mastocytosis/amyloidosis/infiltrative tumor/lymphoma, extrahepatic cholestasis, with hyperemia in the post-prandial state, and with liver transplantation. Correlation with patient history, laboratory data, and clinical condition recommended.  Diagnostic Categories:  < or =5 kPa: high probability of being normal  < or =9 kPa: in the absence of other known clinical signs, rules out cACLD  >9 kPa and ?13 kPa: suggestive of cACLD, but needs further testing  >13 kPa: highly suggestive of cACLD  > or =17 kPa: highly suggestive of cACLD with an increased probability of clinically significant portal hypertension  Electronically Signed   By: Lavonia Dana M.D.   On: 05/18/2019 12:37  Assessment  The primary encounter diagnosis was Lumbar radiculopathy. Diagnoses of Chronic pain syndrome, Chronic myofascial pain, Chronic, continuous use of opioids,  Pharmacologic therapy, and Fibromyalgia were also pertinent to this visit.  Plan of Care   Ms. Mylisa Brunson Harkless has a current medication list which includes the following long-term medication(s): albuterol, budesonide-formoterol, dexlansoprazole, famotidine, fexofenadine, insulin regular, linaclotide, losartan, metformin, mirtazapine, pregabalin, rizatriptan, topiramate, trazodone, venlafaxine, and venlafaxine xr.  Pharmacotherapy (Medications Ordered): Meds ordered this encounter  Medications  . pregabalin (LYRICA) 150 MG capsule    Sig: Take 1 capsule (150 mg total) by mouth 2 (two) times daily.    Dispense:  60 capsule    Refill:  2    Fill one day early if pharmacy is closed on scheduled refill date. May substitute for generic if available.  Marland Kitchen HYDROcodone-acetaminophen (NORCO) 7.5-325 MG tablet    Sig: Take 1 tablet by mouth every 8 (eight) hours as needed for severe pain. Must last 30 days.    Dispense:  90 tablet    Refill:  0    Chronic Pain. (STOP Act - Not applicable). Fill one day early if closed on scheduled refill date.  Marland Kitchen HYDROcodone-acetaminophen (NORCO) 7.5-325 MG tablet    Sig: Take 1 tablet by mouth every 8 (eight) hours as needed for  severe pain. Must last 30 days.    Dispense:  90 tablet    Refill:  0    Chronic Pain. (STOP Act - Not applicable). Fill one day early if closed on scheduled refill date.  Marland Kitchen HYDROcodone-acetaminophen (NORCO) 7.5-325 MG tablet    Sig: Take 1 tablet by mouth every 8 (eight) hours as needed for severe pain. Must last 30 days.    Dispense:  90 tablet    Refill:  0    Chronic Pain. (STOP Act - Not applicable). Fill one day early if closed on scheduled refill date.    Follow-up plan:   No follow-ups on file.   Recent Visits Date Type Provider Dept  09/22/19 Office Visit Gillis Santa, MD [facility]  Showing recent visits within past 90 days and meeting all other requirements Today's Visits Date Type Provider Dept   12/07/19 Telemedicine Gillis Santa, MD [facility]  Showing today's visits and meeting all other requirements Future Appointments No visits were found meeting these conditions. Showing future appointments within next 90 days and meeting all other requirements  I discussed the assessment and treatment plan with the patient. The patient was provided an opportunity to ask questions and all were answered. The patient agreed with the plan and demonstrated an understanding of the instructions.  Patient advised to call back or seek an in-person evaluation if the symptoms or condition worsens.  Duration of encounter: 11mnutes.  Note by: BGillis Santa MD Date: 12/07/2019; Time: 9:42 AM

## 2019-12-08 ENCOUNTER — Encounter: Payer: Medicaid Other | Admitting: Student in an Organized Health Care Education/Training Program

## 2019-12-18 ENCOUNTER — Ambulatory Visit: Admission: RE | Admit: 2019-12-18 | Payer: Medicaid Other | Source: Ambulatory Visit

## 2019-12-18 ENCOUNTER — Other Ambulatory Visit: Payer: Self-pay | Admitting: Family Medicine

## 2019-12-18 ENCOUNTER — Ambulatory Visit
Admission: RE | Admit: 2019-12-18 | Discharge: 2019-12-18 | Disposition: A | Discharge status: Home / Self Care | Payer: Medicaid Other | Source: Ambulatory Visit | Attending: Family Medicine | Admitting: Family Medicine

## 2019-12-18 DIAGNOSIS — M25561 Pain in right knee: Secondary | ICD-10-CM | POA: Insufficient documentation

## 2020-02-10 ENCOUNTER — Telehealth: Payer: Self-pay | Admitting: Student in an Organized Health Care Education/Training Program

## 2020-02-10 NOTE — Telephone Encounter (Signed)
PA sent for hydrocodone - apap and Lyrica via [facility] 02/10/20 LP

## 2020-02-10 NOTE — Telephone Encounter (Signed)
Patient called to get Lyrica and Hydrocodone filled and was told she needs prior auth. Says she had no problem with the first 2 scripts. Please call pharmacy and determine what is needed

## 2020-02-22 ENCOUNTER — Telehealth: Payer: Self-pay | Admitting: Student in an Organized Health Care Education/Training Program

## 2020-02-22 NOTE — Telephone Encounter (Signed)
Patient states her meds need prior auth

## 2020-03-01 ENCOUNTER — Telehealth: Payer: Self-pay | Admitting: Student in an Organized Health Care Education/Training Program

## 2020-03-01 NOTE — Telephone Encounter (Signed)
Patient received letter from insurance stating they are denying coverage for medications.

## 2020-03-01 NOTE — Telephone Encounter (Signed)
Called patient to inquire which med isnt covered. Message left.

## 2020-03-07 ENCOUNTER — Other Ambulatory Visit: Payer: Self-pay

## 2020-03-07 ENCOUNTER — Ambulatory Visit
Payer: Medicaid Other | Attending: Student in an Organized Health Care Education/Training Program | Admitting: Student in an Organized Health Care Education/Training Program

## 2020-03-07 ENCOUNTER — Encounter: Payer: Self-pay | Admitting: Student in an Organized Health Care Education/Training Program

## 2020-03-07 VITALS — BP 125/89 | HR 106 | Temp 97.2°F | Resp 18 | Ht 59.0 in | Wt 132.0 lb

## 2020-03-07 DIAGNOSIS — Z79891 Long term (current) use of opiate analgesic: Secondary | ICD-10-CM

## 2020-03-07 DIAGNOSIS — M797 Fibromyalgia: Secondary | ICD-10-CM

## 2020-03-07 DIAGNOSIS — F1190 Opioid use, unspecified, uncomplicated: Secondary | ICD-10-CM

## 2020-03-07 DIAGNOSIS — M5136 Other intervertebral disc degeneration, lumbar region: Secondary | ICD-10-CM | POA: Insufficient documentation

## 2020-03-07 DIAGNOSIS — M79642 Pain in left hand: Secondary | ICD-10-CM

## 2020-03-07 DIAGNOSIS — M51369 Other intervertebral disc degeneration, lumbar region without mention of lumbar back pain or lower extremity pain: Secondary | ICD-10-CM

## 2020-03-07 DIAGNOSIS — G8929 Other chronic pain: Secondary | ICD-10-CM

## 2020-03-07 DIAGNOSIS — M79641 Pain in right hand: Secondary | ICD-10-CM | POA: Diagnosis present

## 2020-03-07 DIAGNOSIS — M7918 Myalgia, other site: Secondary | ICD-10-CM | POA: Insufficient documentation

## 2020-03-07 DIAGNOSIS — M1711 Unilateral primary osteoarthritis, right knee: Secondary | ICD-10-CM

## 2020-03-07 DIAGNOSIS — M5416 Radiculopathy, lumbar region: Secondary | ICD-10-CM

## 2020-03-07 DIAGNOSIS — G894 Chronic pain syndrome: Secondary | ICD-10-CM

## 2020-03-07 DIAGNOSIS — Z79899 Other long term (current) drug therapy: Secondary | ICD-10-CM | POA: Diagnosis present

## 2020-03-07 DIAGNOSIS — M899 Disorder of bone, unspecified: Secondary | ICD-10-CM

## 2020-03-07 MED ORDER — HYDROCODONE-ACETAMINOPHEN 7.5-325 MG PO TABS: 1.0000 | ORAL_TABLET | Freq: Three times a day (TID) | ORAL | 0 refills | Status: AC | PRN

## 2020-03-07 MED ORDER — HYDROCODONE-ACETAMINOPHEN 7.5-325 MG PO TABS: 1.0000 | ORAL_TABLET | Freq: Three times a day (TID) | ORAL | 0 refills | Status: DC | PRN

## 2020-03-07 MED ORDER — MAGNESIUM 500 MG PO CAPS: 500.0000 mg | ORAL_CAPSULE | Freq: Every day | ORAL | 2 refills | Status: AC

## 2020-03-07 MED ORDER — PREGABALIN 150 MG PO CAPS: 150.0000 mg | ORAL_CAPSULE | Freq: Two times a day (BID) | ORAL | 2 refills | Status: DC

## 2020-03-07 NOTE — Progress Notes (Signed)
Nursing Pain Medication Assessment:  Safety precautions to be maintained throughout the outpatient stay will include: orient to surroundings, keep bed in low position, maintain call bell within reach at all times, provide assistance with transfer out of bed and ambulation.  Medication Inspection Compliance: Pill count conducted under aseptic conditions, in front of the patient. Neither the pills nor the bottle was removed from the patient's sight at any time. Once count was completed pills were immediately returned to the patient in their original bottle.  Medication: Hydrocodone/APAP Pill/Patch Count: 25 of 90 pills remain Pill/Patch Appearance: Markings consistent with prescribed medication Bottle Appearance: Standard pharmacy container. Clearly labeled. Filled Date: 3 / 20 / 2021 Last Medication intake:  Yesterday    Safety precautions to be maintained throughout the outpatient stay will include: orient to surroundings, keep bed in low position, maintain call bell within reach at all times, provide assistance with transfer out of bed and ambulation.

## 2020-03-07 NOTE — Progress Notes (Signed)
PROVIDER NOTE: Information contained herein reflects review and annotations entered in association with encounter. Interpretation of such information and data should be left to medically-trained personnel. Information provided to patient can be located elsewhere in the medical record under "Patient Instructions". Document created using STT-dictation technology, any transcriptional errors that may result from process are unintentional.    Patient: [name]  Service Category: E/M  Provider: Gillis Santa, MD  DOB: [birth year]  DOS: 03/07/2020  Specialty: Interventional Pain Management  MRN: [id number]  Setting: Ambulatory outpatient  PCP: Sharyne Peach, MD  Type: Established Patient    Referring Provider: Sharyne Peach, MD  Location: Office  Delivery: Face-to-face     HPI  [name], a 59 y.o. year old female, is here today because of her Lumbar radiculopathy [M54.16]. Ms. Kiel's primary complain today is Medication Refill Last encounter: My last encounter with her was on 03/01/2020. Pertinent problems: Ms. Stifter has Fibromyalgia; Lumbar radiculopathy; Chronic bilateral low back pain with bilateral sciatica; Chronic pain syndrome; Chronic, continuous use of opioids; Chronic myofascial pain; Primary osteoarthritis of both hips; Type 2 diabetes mellitus without complications ([place]); and Pain in both hands on their pertinent problem list. Pain Assessment: Severity of Chronic pain is reported as a 8 /10. Location: Back Lower,Mid/Having muscle spasms in the general back area that is new in the last couple of weeks. Pain also radiates in the hips as well. Onset: More than a month ago. Quality: Constant,Spasm. Timing: Constant. Modifying factor(s): Hydrocone. Vitals:  height is _0  (1.499 m) and weight is 132 lb (59.9 kg). Her temperature is 97.2 F (36.2 C) (abnormal). Her blood pressure is 125/89 and her pulse is 106 (abnormal). Her respiration is 59 and oxygen  saturation is 95%.   Reason for encounter: medication management.  Patient presents today for medication management.  No significant change in her medical history.  Denies any constipation, cognitive dysfunction, itching.  Does endorse mild nausea that is chronic.  She complains of persistent right knee pain that started after a fall in early September.  She continues to have this.  Right knee x-ray shows moderate tricompartmental degenerative changes greatest in the medial component which is where the patient has her greatest pain.  She continues her hydrocodone and Lyrica as prescribed.  She is endorsing lumbar paraspinal muscle spasms as well.  Pharmacotherapy Assessment   Analgesic: Norco 7.5 mg TID PRN #90/month   Monitoring: [place] PMP: PDMP not reviewed this encounter.       Pharmacotherapy: No side-effects or adverse reactions reported. Compliance: No problems identified. Effectiveness: Clinically acceptable.  Janne Napoleon, RN  03/07/2020  8:52 AM  Sign when Signing Visit Nursing Pain Medication Assessment:  Safety precautions to be maintained throughout the outpatient stay will include: orient to surroundings, keep bed in low position, maintain call bell within reach at all times, provide assistance with transfer out of bed and ambulation.  Medication Inspection Compliance: Pill count conducted under aseptic conditions, in front of the patient. Neither the pills nor the bottle was removed from the patient's sight at any time. Once count was completed pills were immediately returned to the patient in their original bottle.  Medication: Hydrocodone/APAP Pill/Patch Count: 25 of 90 pills remain Pill/Patch Appearance: Markings consistent with prescribed medication Bottle Appearance: Standard pharmacy container. Clearly labeled. Filled Date: 15 / 20 / 2021 Last Medication intake:  Yesterday    Safety precautions to be maintained throughout the outpatient stay will include: orient to  surroundings, keep bed in low position, maintain call bell within reach at all times, provide assistance with transfer out of bed and ambulation.     UDS:  Summary  Date Value Ref Range Status  02/24/2019 Note  Final    Comment:    ==================================================================== [facility] ([facility]) ==================================================================== Test                             Result       Flag       Units Drug Present and Declared for Prescription Verification   Hydrocodone                    990          EXPECTED   ng/mg creat   Hydromorphone                  183          EXPECTED   ng/mg creat   Dihydrocodeine                 210          EXPECTED   ng/mg creat   Norhydrocodone                 1496         EXPECTED   ng/mg creat    Sources of hydrocodone include scheduled prescription medications.    Hydromorphone, dihydrocodeine and norhydrocodone are expected    metabolites of hydrocodone. Hydromorphone and dihydrocodeine are    also available as scheduled prescription medications. Drug Present not Declared for Prescription Verification   Alcohol, Ethyl                 0.072        UNEXPECTED g/dL    Sources of ethyl alcohol include alcoholic beverages or as a    fermentation product of glucose; glucose is present in this specimen.    Interpret result with caution, as the presence of ethyl alcohol is    likely due, at least in part, to fermentation of glucose. ==================================================================== Test                      Result    Flag   Units      Ref Range   Creatinine              71               mg/dL      >=20 ==================================================================== Declared Medications:  The flagging and interpretation on this report are based on the  following declared medications.  Unexpected results may arise from  inaccuracies in the declared medications.  **Note: The testing  scope of this panel includes these medications:  Hydrocodone  **Note: The testing scope of this panel does not include the  following reported medications:  Acetaminophen  Albuterol  Aspirin  Budesonide (Symbicort)  Cetirizine (Zyrtec)  Dexlansoprazole  Famotidine (Pepcid)  Fexofenadine (Allegra)  Formoterol (Symbicort)  Hyoscyamine  Insulin  Linaclotide (Linzess)  Lisinopril  Losartan (Cozaar)  Meclizine  Metformin  Mirtazapine (Remeron)  Multivitamin  Naproxen  Ondansetron  Polyethylene Glycol  Pregabalin  Rizatriptan (Maxalt)  Rosuvastatin (Crestor)  Solifenacin (Vesicare)  Topical  Topiramate  Trazodone ==================================================================== For clinical consultation, please call [phone]. ====================================================================      ROS  Constitutional: Denies any fever or chills Gastrointestinal:  No reported hemesis, hematochezia, vomiting, or acute GI distress Musculoskeletal: Denies any acute onset joint swelling, redness, loss of ROM, or weakness Neurological: No reported episodes of acute onset apraxia, aphasia, dysarthria, agnosia, amnesia, paralysis, loss of coordination, or loss of consciousness  Medication Review  Alive Once Daily Womens, Dexlansoprazole, HYDROcodone-acetaminophen, Magnesium, albuterol, aspirin, budesonide-formoterol, famotidine, fesoterodine, fexofenadine, fluticasone, glucose blood, insulin detemir, insulin regular, linaclotide, losartan, metFORMIN, mirtazapine, ondansetron, polyethylene glycol, pregabalin, rizatriptan, rosuvastatin, solifenacin, topiramate, traZODone, venlafaxine, and venlafaxine XR  History Review  Allergy: Ms. Harbour is allergic to tramadol, benadryl [diphenhydramine hcl], bextra [valdecoxib], invokana [canagliflozin], and sulfamethoxazole-trimethoprim. Drug: Ms. Mctigue  reports previous drug use. Alcohol:  reports current alcohol use of  about 1.0 standard drink of alcohol per week. Tobacco:  reports that she has been smoking cigarettes. She has a 7.00 pack-year smoking history. She has quit using smokeless tobacco. Social: Ms. Myer  reports that she has been smoking cigarettes. She has a 7.00 pack-year smoking history. She has quit using smokeless tobacco. She reports current alcohol use of about 1.0 standard drink of alcohol per week. She reports previous drug use. Medical:  has a past medical history of Asthma, Asthma, BPPV (benign paroxysmal positional vertigo), Cardiac murmur, unspecified (08/28/2016), CPAP (continuous positive airway pressure) dependence, Degenerative disc disease, lumbar, Depression, Diabetes mellitus, Diverticulitis (10/2016), Fatty liver, Fibromyalgia, Fibromyalgia, GERD (gastroesophageal reflux disease), Headache, History of degenerative disc disease, Hyperlipidemia, Hypertension, and Sleep apnea. Surgical: Ms. Salmon  has a past surgical history that includes Abdominal surgery; Abdominal hysterectomy; Esophagogastroduodenoscopy (egd) with propofol (N/A, 02/20/2017); Colonoscopy with propofol (N/A, 02/20/2017); Upper esophageal endoscopic ultrasound (eus) (N/A, 04/03/2017); Diverticulitis; EUS (N/A, 01/08/2018); Cholecystectomy (N/A, 04/27/2018); Cataract extraction w/PHACO (Left, 09/07/2019); and Cataract extraction w/PHACO (Right, 09/28/2019). Family: family history includes Diabetes in her mother; Hypertension in her mother.  Laboratory Chemistry Profile   Renal Lab Results  Component Value Date   BUN 15 08/09/2019   CREATININE 0.59 08/09/2019   BCR 14 11/27/2017   GFRAA >60 08/09/2019   GFRNONAA >60 08/09/2019     Hepatic Lab Results  Component Value Date   AST 25 08/09/2019   ALT 36 08/09/2019   ALBUMIN 3.9 08/09/2019   ALKPHOS 120 08/09/2019   LIPASE 19 08/09/2019     Electrolytes Lab Results  Component Value Date   NA 138 08/09/2019   K 4.2 08/09/2019   CL 108 08/09/2019    CALCIUM 8.8 (L) 08/09/2019   MG 1.8 11/27/2017     Bone No results found for: VD25OH, VD125OH2TOT, QM0867YP9, JK9326ZT2, 25OHVITD1, 25OHVITD2, 25OHVITD3, TESTOFREE, TESTOSTERONE   Inflammation (CRP: Acute Phase) (ESR: Chronic Phase) Lab Results  Component Value Date   CRP <1 11/27/2017   ESRSEDRATE 17 11/27/2017   LATICACIDVEN 1.6 10/14/2016       Note: Above Lab results reviewed.  Recent Imaging Review  DG Knee Complete 4 Views Right CLINICAL DATA:  Acute pain of the right knee. Pain status post fall.  EXAM: RIGHT KNEE - COMPLETE 4+ VIEW  COMPARISON:  None.  FINDINGS: There are moderate tricompartmental degenerative changes, greatest within the medial and patellofemoral compartments. Large marginal osteophytes are noted. There is prepatellar soft tissue swelling and soft tissue swelling about the knee. There is a large suprapatellar joint effusion. There is no definite acute displaced fracture or dislocation.  IMPRESSION: 1. Prepatellar soft tissue swelling and a large suprapatellar joint effusion. No definite acute displaced fracture or dislocation. If there is high clinical suspicion for an occult fracture, follow-up with cross-sectional imaging is recommended.  2. Moderate tricompartmental degenerative changes, greatest in the medial and patellofemoral compartments.  Electronically Signed   By: Constance Holster M.D.   On: 12/19/2019 22:56 Note: Reviewed        Physical Exam  General appearance: Well nourished, well developed, and well hydrated. In no apparent acute distress Mental status: Alert, oriented x 3 (person, place, & time)       Respiratory: No evidence of acute respiratory distress Eyes: PERLA Vitals: BP 125/89   Pulse (!) 106   Temp (!) 97.2 F (36.2 C)   Resp 18   Ht _0  (1.499 m)   Wt 132 lb (59.9 kg)   LMP  (LMP Unknown) Comment: "many years"  SpO2 95%   BMI 26.66 kg/m  BMI: Estimated body mass index is 26.66 kg/m as calculated from  the following:   Height as of this encounter: _1  (1.499 m).   Weight as of this encounter: 132 lb (59.9 kg). Ideal: Patient must be at least 60 in tall to calculate ideal body weight   Lumbar Spine Area Exam  Skin & Axial Inspection: No masses, redness, or swelling Alignment: Symmetrical Functional ROM: Pain restricted ROM       Stability: No instability detected Muscle Tone/Strength: Functionally intact. No obvious neuro-muscular anomalies detected. Sensory (Neurological): Dermatomal pain pattern and musculoskeletal  Gait & Posture Assessment  Ambulation: Unassisted Gait: Relatively normal for age and body habitus Posture: WNL   Lower Extremity Exam    Side: Right lower extremity  Side: Left lower extremity  Stability: No instability observed          Stability: No instability observed          Skin & Extremity Inspection: Skin color, temperature, and hair growth are WNL. No peripheral edema or cyanosis. No masses, redness, swelling, asymmetry, or associated skin lesions. No contractures.  Skin & Extremity Inspection: Skin color, temperature, and hair growth are WNL. No peripheral edema or cyanosis. No masses, redness, swelling, asymmetry, or associated skin lesions. No contractures.  Functional ROM: Pain restricted ROM for hip and knee joints          Functional ROM: Pain restricted ROM for hip and knee joints          Muscle Tone/Strength: Functionally intact. No obvious neuro-muscular anomalies detected.  Muscle Tone/Strength: Functionally intact. No obvious neuro-muscular anomalies detected.  Sensory (Neurological): Neuropathic pain pattern       and arthropathic arthralgia of right knee  Sensory (Neurological): Neuropathic pain pattern        DTR: Patellar: deferred today Achilles: deferred today Plantar: deferred today  DTR: Patellar: deferred today Achilles: deferred today Plantar: deferred today  Palpation: No palpable anomalies  Palpation: No palpable  anomalies     Assessment   Status Diagnosis  Persistent Controlled Persistent 1. Lumbar radiculopathy   2. Chronic pain syndrome   3. Chronic myofascial pain   4. Chronic, continuous use of opioids   5. Pharmacologic therapy   6. Fibromyalgia   7. DDD (degenerative disc disease), lumbar   8. Pain in both hands   9. Disorder of skeletal system   10. Long term prescription opiate use   11. Primary osteoarthritis of right knee       Plan of Care  Ms. Ubah Radke Dwight has a current medication list which includes the following long-term medication(s): albuterol, budesonide-formoterol, dexlansoprazole, famotidine, fexofenadine, insulin regular, linaclotide, losartan, metformin, mirtazapine, rizatriptan, topiramate, trazodone, venlafaxine xr, pregabalin, and venlafaxine.   1.  Urine toxicology screen for annual medication compliance monitoring 2.  Refill hydrocodone as below no change in dose. 3.  Refill Lyrica as below no change in dose 4.  Right knee intra-articular steroid injection given persistent right knee pain related to osteoarthritis 5.  Start magnesium as below for lumbar paraspinal muscle cramps/spasms   Pharmacotherapy (Medications Ordered): Meds ordered this encounter  Medications  . HYDROcodone-acetaminophen (NORCO) 7.5-325 MG tablet    Sig: Take 1 tablet by mouth every 8 (eight) hours as needed for severe pain. Must last 30 days.    Dispense:  90 tablet    Refill:  0    Chronic Pain. (STOP Act - Not applicable). Fill one day early if closed on scheduled refill date.  Marland Kitchen HYDROcodone-acetaminophen (NORCO) 7.5-325 MG tablet    Sig: Take 1 tablet by mouth every 8 (eight) hours as needed for severe pain. Must last 30 days.    Dispense:  90 tablet    Refill:  0    Chronic Pain. (STOP Act - Not applicable). Fill one day early if closed on scheduled refill date.  Marland Kitchen HYDROcodone-acetaminophen (NORCO) 7.5-325 MG tablet    Sig: Take 1 tablet by mouth every 8 (eight)  hours as needed for severe pain. Must last 30 days.    Dispense:  90 tablet    Refill:  0    Chronic Pain. (STOP Act - Not applicable). Fill one day early if closed on scheduled refill date.  . pregabalin (LYRICA) 150 MG capsule    Sig: Take 1 capsule (150 mg total) by mouth 2 (two) times daily.    Dispense:  60 capsule    Refill:  2    Fill one day early if pharmacy is closed on scheduled refill date. May substitute for generic if available.  . Magnesium 500 MG CAPS    Sig: Take 1 capsule (500 mg total) by mouth at bedtime.    Dispense:  30 capsule    Refill:  2    Fill one day early if pharmacy is closed on scheduled refill date. May substitute for generic, or similar, if available.   Orders:  Orders Placed This Encounter  Procedures  . KNEE INJECTION    Local Anesthetic & Steroid injection.    Standing Status:   Future    Standing Expiration Date:   06/05/2020    Scheduling Instructions:     Side: RIGHT     Sedation: None     Timeframe: As soon as schedule allows    Order Specific Question:   Where will this procedure be performed?    Answer:   [facility]  . [employer] 13 (MW), Urine    Volume: 30 ml(s). Minimum 3 ml of urine is needed. Document temperature of fresh sample. Indications: Long term (current) use of opiate analgesic (O87.867)    Order Specific Question:   Release to patient    Answer:   Immediate   Follow-up plan:   Return in about 3 weeks (around 03/28/2020) for R knee steroid .    Recent Visits No visits were found meeting these conditions. Showing recent visits within past 90 days and meeting all other requirements Today's Visits Date Type Provider Dept  03/07/20 Office Visit Gillis Santa, MD [facility]  Showing today's visits and meeting all other requirements Future Appointments No visits were found meeting these conditions. Showing future appointments within next 90 days and meeting all other requirements  I discussed the  assessment  and treatment plan with the patient. The patient was provided an opportunity to ask questions and all were answered. The patient agreed with the plan and demonstrated an understanding of the instructions.  Patient advised to call back or seek an in-person evaluation if the symptoms or condition worsens.  Duration of encounter:30 minutes.  Note by: Gillis Santa, MD Date: 03/07/2020; Time: 9:09 AM

## 2020-03-16 LAB — TOXASSURE SELECT 13 (MW), URINE

## 2020-03-29 ENCOUNTER — Encounter: Payer: Self-pay | Admitting: Student in an Organized Health Care Education/Training Program

## 2020-03-29 ENCOUNTER — Ambulatory Visit
Admission: RE | Admit: 2020-03-29 | Discharge: 2020-03-29 | Disposition: A | Discharge status: Home / Self Care | Payer: Medicaid Other | Source: Ambulatory Visit | Attending: Student in an Organized Health Care Education/Training Program | Admitting: Student in an Organized Health Care Education/Training Program

## 2020-03-29 ENCOUNTER — Other Ambulatory Visit: Payer: Self-pay

## 2020-03-29 ENCOUNTER — Ambulatory Visit (HOSPITAL_BASED_OUTPATIENT_CLINIC_OR_DEPARTMENT_OTHER): Payer: Medicaid Other | Admitting: Student in an Organized Health Care Education/Training Program

## 2020-03-29 VITALS — BP 134/90 | HR 103 | Temp 97.2°F | Resp 18 | Ht 59.0 in | Wt 131.0 lb

## 2020-03-29 DIAGNOSIS — G894 Chronic pain syndrome: Secondary | ICD-10-CM

## 2020-03-29 DIAGNOSIS — M1711 Unilateral primary osteoarthritis, right knee: Secondary | ICD-10-CM

## 2020-03-29 MED ORDER — LIDOCAINE HCL 2 % IJ SOLN
20.0000 mL | Freq: Once | INTRAMUSCULAR | Status: AC
  Administered 2020-03-29: 200 mg

## 2020-03-29 MED ORDER — METHYLPREDNISOLONE ACETATE 40 MG/ML IJ SUSP
INTRAMUSCULAR | Status: AC
  Filled 2020-03-29: qty 1

## 2020-03-29 MED ORDER — LIDOCAINE HCL 2 % IJ SOLN
INTRAMUSCULAR | Status: AC
  Filled 2020-03-29: qty 20

## 2020-03-29 MED ORDER — METHYLPREDNISOLONE ACETATE 40 MG/ML IJ SUSP
40.0000 mg | Freq: Once | INTRAMUSCULAR | Status: AC
  Administered 2020-03-29: 40 mg via INTRA_ARTICULAR

## 2020-03-29 NOTE — Patient Instructions (Signed)

## 2020-03-29 NOTE — Progress Notes (Signed)
Safety precautions to be maintained throughout the outpatient stay will include: orient to surroundings, keep bed in low position, maintain call bell within reach at all times, provide assistance with transfer out of bed and ambulation.  

## 2020-03-29 NOTE — Progress Notes (Signed)
PROVIDER NOTE: Information contained herein reflects review and annotations entered in association with encounter. Interpretation of such information and data should be left to medically-trained personnel. Information provided to patient can be located elsewhere in the medical record under "Patient Instructions". Document created using STT-dictation technology, any transcriptional errors that may result from process are unintentional.    Patient: [name]  Service Category: Procedure  Provider: Edward Jolly, MD  DOB: [birth year]  DOS: 03/29/2020  Location: [facility]  MRN: [id number]  Setting: Ambulatory - outpatient  Referring Provider: Rayetta Humphrey, MD  Type: Established Patient  Specialty: Interventional Pain Management  PCP: Rayetta Humphrey, MD   Primary Reason for Visit: Interventional Pain Management Treatment. CC: Pain (Right knee)  Procedure:          Anesthesia, Analgesia, Anxiolysis:  Type: Diagnostic Intra-Articular Local anesthetic and steroid Knee Injection #1  Region: Medial infrapatellar Knee Region Level: Knee Joint Laterality: Right knee  Type: Local Anesthesia Indication(s): Analgesia         Local Anesthetic: Lidocaine 1-2% Route: Infiltration ([place]/IM) IV Access: Declined Sedation: Declined   Position: Sitting   Indications: 1. Primary osteoarthritis of right knee   2. Chronic pain syndrome    Pain Score: Pre-procedure: 10-Worst pain ever/10 Post-procedure: 10-Worst pain ever (states no change since admission)/10   Pre-op H&P Assessment:  [name] is a 60 y.o. (year old), female patient, seen today for interventional treatment. She  has a past surgical history that includes Abdominal surgery; Abdominal hysterectomy; Esophagogastroduodenoscopy (egd) with propofol (N/A, 02/20/2017); Colonoscopy with propofol (N/A, 02/20/2017); Upper esophageal endoscopic ultrasound (eus) (N/A, 04/03/2017); Diverticulitis; EUS (N/A, 01/08/2018);  Cholecystectomy (N/A, 04/27/2018); Cataract extraction w/PHACO (Left, 09/07/2019); and Cataract extraction w/PHACO (Right, 09/28/2019). [name] has a current medication list which includes the following prescription(s): accu-chek guide, albuterol, aspirin, budesonide-formoterol, dexlansoprazole, famotidine, fesoterodine, fexofenadine, fluticasone, hydrocodone-acetaminophen, [START ON 04/11/2020] hydrocodone-acetaminophen, [START ON 05/11/2020] hydrocodone-acetaminophen, insulin regular, levemir flextouch, linaclotide, losartan, magnesium, metformin, mirtazapine, alive once daily womens, ondansetron, polyethylene glycol, pregabalin, rizatriptan, rosuvastatin, solifenacin, topiramate, trazodone, venlafaxine, and venlafaxine xr. Her primarily concern today is the Pain (Right knee)  Initial Vital Signs:  Pulse/HCG Rate: (!) 103  Temp: (!) 97.2 F (36.2 C) Resp: 18 BP: 134/90 SpO2: 95 %  BMI: Estimated body mass index is 26.46 kg/m as calculated from the following:   Height as of this encounter: 4\' 11"  (1.499 m).   Weight as of this encounter: 131 lb (59.4 kg).  Risk Assessment: Allergies: Reviewed. She is allergic to tramadol, benadryl [diphenhydramine hcl], bextra [valdecoxib], invokana [canagliflozin], and sulfamethoxazole-trimethoprim.  Allergy Precautions: None required Coagulopathies: Reviewed. None identified.  Blood-thinner therapy: None at this time Active Infection(s): Reviewed. None identified. [name] is afebrile  Site Confirmation: [name] was asked to confirm the procedure and laterality before marking the site Procedure checklist: Completed Consent: Before the procedure and under the influence of no sedative(s), amnesic(s), or anxiolytics, the patient was informed of the treatment options, risks and possible complications. To fulfill our ethical and legal obligations, as recommended by the American Medical Association's Code of Ethics, I have informed the patient of  my clinical impression; the nature and purpose of the treatment or procedure; the risks, benefits, and possible complications of the intervention; the alternatives, including doing nothing; the risk(s) and benefit(s) of the alternative treatment(s) or procedure(s); and the risk(s) and benefit(s) of doing nothing. The patient was provided information about the general risks and possible complications associated with the procedure. These may  include, but are not limited to: failure to achieve desired goals, infection, bleeding, organ or nerve damage, allergic reactions, paralysis, and death. In addition, the patient was informed of those risks and complications associated to the procedure, such as failure to decrease pain; infection; bleeding; organ or nerve damage with subsequent damage to sensory, motor, and/or autonomic systems, resulting in permanent pain, numbness, and/or weakness of one or several areas of the body; allergic reactions; (i.e.: anaphylactic reaction); and/or death. Furthermore, the patient was informed of those risks and complications associated with the medications. These include, but are not limited to: allergic reactions (i.e.: anaphylactic or anaphylactoid reaction(s)); adrenal axis suppression; blood sugar elevation that in diabetics may result in ketoacidosis or comma; water retention that in patients with history of congestive heart failure may result in shortness of breath, pulmonary edema, and decompensation with resultant heart failure; weight gain; swelling or edema; medication-induced neural toxicity; particulate matter embolism and blood vessel occlusion with resultant organ, and/or nervous system infarction; and/or aseptic necrosis of one or more joints. Finally, the patient was informed that Medicine is not an exact science; therefore, there is also the possibility of unforeseen or unpredictable risks and/or possible complications that may result in a catastrophic outcome. The  patient indicated having understood very clearly. We have given the patient no guarantees and we have made no promises. Enough time was given to the patient to ask questions, all of which were answered to the patient's satisfaction. [name] has indicated that she wanted to continue with the procedure. Attestation: I, the ordering provider, attest that I have discussed with the patient the benefits, risks, side-effects, alternatives, likelihood of achieving goals, and potential problems during recovery for the procedure that I have provided informed consent. Date  Time: 03/29/2020  9:09 AM  Pre-Procedure Preparation:  Monitoring: As per clinic protocol. Respiration, ETCO2, SpO2, BP, heart rate and rhythm monitor placed and checked for adequate function Safety Precautions: Patient was assessed for positional comfort and pressure points before starting the procedure. Time-out: I initiated and conducted the "Time-out" before starting the procedure, as per protocol. The patient was asked to participate by confirming the accuracy of the "Time Out" information. Verification of the correct person, site, and procedure were performed and confirmed by me, the nursing staff, and the patient. "Time-out" conducted as per Joint Commission's Universal Protocol (UP.01.01.01). Time: 0941  Description of Procedure:          Target Area: Knee Joint Approach: Just above the Medial tibial plateau, lateral to the infrapatellar tendon. Area Prepped: Entire knee area, from the mid-thigh to the mid-shin. DuraPrep (Iodine Povacrylex [0.7% available iodine] and Isopropyl Alcohol, 74% w/w) Safety Precautions: Aspiration looking for blood return was conducted prior to all injections. At no point did we inject any substances, as a needle was being advanced. No attempts were made at seeking any paresthesias. Safe injection practices and needle disposal techniques used. Medications properly checked for expiration dates. SDV  (single dose vial) medications used. Description of the Procedure: Protocol guidelines were followed. The patient was placed in position over the fluoroscopy table. The target area was identified and the area prepped in the usual manner. Skin & deeper tissues infiltrated with local anesthetic. Appropriate amount of time allowed to pass for local anesthetics to take effect. The procedure needles were then advanced to the target area. Proper needle placement secured. Negative aspiration confirmed. Solution injected in intermittent fashion, asking for systemic symptoms every 0.5cc of injectate. The needles were then removed and  the area cleansed, making sure to leave some of the prepping solution back to take advantage of its long term bactericidal properties. Vitals:   03/29/20 0913  BP: 134/90  Pulse: (!) 103  Resp: 18  Temp: (!) 97.2 F (36.2 C)  TempSrc: Temporal  SpO2: 95%  Weight: 131 lb (59.4 kg)  Height: 4\' 11"  (1.499 m)    Start Time: 0941 hrs. End Time: 0942 hrs. Materials:  Needle(s) Type: Regular needle Gauge: 25G Length: 1.5-in Medication(s): Please see orders for medications and dosing details. 4 cc solution made of 3 cc of 2% lidocaine, 1 cc of methylprednisolone, 40 mg/cc Imaging Guidance:          Type of Imaging Technique: None used Indication(s): N/A Exposure Time: No patient exposure Contrast: None used. Fluoroscopic Guidance: N/A Ultrasound Guidance: N/A Interpretation: N/A  Antibiotic Prophylaxis:   Anti-infectives (From admission, onward)   None     Indication(s): None identified  Post-operative Assessment:  Post-procedure Vital Signs:  Pulse/HCG Rate: (!) 103  Temp: (!) 97.2 F (36.2 C) Resp: 18 BP: 134/90 SpO2: 95 %  EBL: None  Complications: No immediate post-treatment complications observed by team, or reported by patient.  Note: The patient tolerated the entire procedure well. A repeat set of vitals were taken after the procedure and the  patient was kept under observation following institutional policy, for this type of procedure. Post-procedural neurological assessment was performed, showing return to baseline, prior to discharge. The patient was provided with post-procedure discharge instructions, including a section on how to identify potential problems. Should any problems arise concerning this procedure, the patient was given instructions to immediately contact us, at any time, without hesitation. In any case, we plan to contact the patient by telephone for a follow-up status report regarding this interventional procedure.  Comments:  No additional relevant information.  Plan of Care   Chronic Opioid Analgesic:  Norco 7.5 mg TID PRN #90/month   Plan for right knee x-ray given been persistent right knee pain that is worse with weightbearing.  Medications ordered for procedure: Meds ordered this encounter  Medications  . methylPREDNISolone acetate (DEPO-MEDROL) injection 40 mg  . lidocaine (XYLOCAINE) 2 % (with pres) injection 400 mg   Medications administered: We administered methylPREDNISolone acetate and lidocaine.  See the medical record for exact dosing, route, and time of administration.  Follow-up plan:   Return for Keep sch. appt.    Recent Visits Date Type Provider Dept  03/07/20 Office Visit Gillis Santa, MD [facility]  Showing recent visits within past 90 days and meeting all other requirements Today's Visits Date Type Provider Dept  03/29/20 Procedure visit Gillis Santa, MD [facility]  Showing today's visits and meeting all other requirements Future Appointments Date Type Provider Dept  06/06/20 Appointment Gillis Santa, MD [facility]  Showing future appointments within next 90 days and meeting all other requirements  Disposition: Discharge home  Discharge (Date  Time): 03/29/2020; 1000 hrs.   Primary Care Physician: Sharyne Peach, MD Location: [facility]  Outpatient Pain Management Facility Note by: Gillis Santa, MD Date: 03/29/2020; Time: 10:02 AM  Disclaimer:  Medicine is not an exact science. The only guarantee in medicine is that nothing is guaranteed. It is important to note that the decision to proceed with this intervention was based on the information collected from the patient. The Data and conclusions were drawn from the patient's questionnaire, the interview, and the physical examination. Because the information was provided in large  part by the patient, it cannot be guaranteed that it has not been purposely or unconsciously manipulated. Every effort has been made to obtain as much relevant data as possible for this evaluation. It is important to note that the conclusions that lead to this procedure are derived in large part from the available data. Always take into account that the treatment will also be dependent on availability of resources and existing treatment guidelines, considered by other Pain Management Practitioners as being common knowledge and practice, at the time of the intervention. For Medico-Legal purposes, it is also important to point out that variation in procedural techniques and pharmacological choices are the acceptable norm. The indications, contraindications, technique, and results of the above procedure should only be interpreted and judged by a Board-Certified Interventional Pain Specialist with extensive familiarity and expertise in the same exact procedure and technique.

## 2020-03-30 ENCOUNTER — Telehealth: Payer: Self-pay | Admitting: *Deleted

## 2020-03-30 NOTE — Telephone Encounter (Signed)
Called for post procedure check. Denies any problems post knee injection.

## 2020-04-04 ENCOUNTER — Telehealth: Payer: Self-pay

## 2020-04-04 NOTE — Telephone Encounter (Signed)
FYI

## 2020-04-04 NOTE — Telephone Encounter (Signed)
She said Dr. Cherylann Ratel told her to call back and let us know how her leg was doing. Down at the bottom is ok, just a few spots. On the other leg is ok. Around her knee on the right side is sore and it hurts. She has been using a knee brace and biofreeze.

## 2020-04-12 ENCOUNTER — Telehealth: Payer: Self-pay | Admitting: Student in an Organized Health Care Education/Training Program

## 2020-04-12 NOTE — Telephone Encounter (Signed)
Voicemail left with patient to let her know that if she has no improvement from intra articular knee injection on 03/29/20 she will need to make a f/up appt with Dr Cherylann Ratel to see how he would like to proceed or what he would suggest.

## 2020-04-12 NOTE — Telephone Encounter (Signed)
Having right sided knee pain all the time. Wants to know what can be done about it. Injection hasnt helped at this point. Please advise patient.

## 2020-04-13 ENCOUNTER — Telehealth: Payer: Self-pay

## 2020-04-13 NOTE — Telephone Encounter (Signed)
She called [name] back but wasn't sure what she was supposed to do and I couldn't find a telephone message so I told her we would call her back

## 2020-04-13 NOTE — Telephone Encounter (Signed)
Spoke with patient re; knee injection and no improvement.  She is also c/o increased pain in her legs that the hydrocodone does not help as well edema in feet.  My suggestion to her was to schedule an appt with Dr Cherylann Ratel for f/up and to discuss options for leg pain and edema. Transferred up to scheduling for appt.

## 2020-04-17 ENCOUNTER — Other Ambulatory Visit: Payer: Self-pay

## 2020-04-17 ENCOUNTER — Ambulatory Visit
Payer: Medicaid Other | Attending: Student in an Organized Health Care Education/Training Program | Admitting: Student in an Organized Health Care Education/Training Program

## 2020-04-17 ENCOUNTER — Encounter: Payer: Self-pay | Admitting: Student in an Organized Health Care Education/Training Program

## 2020-04-17 DIAGNOSIS — M1711 Unilateral primary osteoarthritis, right knee: Secondary | ICD-10-CM | POA: Diagnosis not present

## 2020-04-17 DIAGNOSIS — G894 Chronic pain syndrome: Secondary | ICD-10-CM | POA: Diagnosis not present

## 2020-04-17 NOTE — Progress Notes (Signed)
Patient: [name]  Service Category: E/M  Provider: Gillis Santa, MD  DOB: 08-Mar-1961  DOS: 04/17/2020  Location: Office  MRN: [id number]  Setting: Ambulatory outpatient  Referring Provider: Sharyne Peach, MD  Type: Established Patient  Specialty: Interventional Pain Management  PCP: Sharyne Peach, MD  Location: Home  Delivery: TeleHealth     Virtual Encounter - Pain Management PROVIDER NOTE: Information contained herein reflects review and annotations entered in association with encounter. Interpretation of such information and data should be left to medically-trained personnel. Information provided to patient can be located elsewhere in the medical record under "Patient Instructions". Document created using STT-dictation technology, any transcriptional errors that may result from process are unintentional.    Contact & Pharmacy Preferred: [phone] Home: [phone] (home) Mobile: There is no such number on file (mobile). E-mail: No e-mail address on record  [id number] [id number]: [phone]: [phone]  Pre-screening  [name] offered "in-person" vs "virtual" encounter. She indicated preferring virtual for this encounter.   Reason COVID-19*  Social distancing based on CDC and AMA recommendations.   I contacted JDariann HuckabaClinkscales on 04/17/2020 via video conference.      I clearly identified myself as BGillis Santa MD. I verified that I was speaking with the correct person using two identifiers (Name: [name] and date of birth: 7August 04, 1962.  Consent I sought verbal advanced consent from JIrwintonfor virtual visit interactions. I informed [name] of possible security and privacy concerns, risks, and limitations associated with providing "not-in-person" medical evaluation and management services. I also informed [name] of the  availability of "in-person" appointments. Finally, I informed her that there would be a charge for the virtual visit and that she could be  personally, fully or partially, financially responsible for it. Ms. CHullumexpressed understanding and agreed to proceed.   Historic Elements   Ms. JYULIZA CARAis a 60y.o. year old, female patient evaluated today after our last contact on 04/12/2020. [name] has a past medical history of Asthma, Asthma, BPPV (benign paroxysmal positional vertigo), Cardiac murmur, unspecified (08/28/2016), CPAP (continuous positive airway pressure) dependence, Degenerative disc disease, lumbar, Depression, Diabetes mellitus, Diverticulitis (10/2016), Fatty liver, Fibromyalgia, Fibromyalgia, GERD (gastroesophageal reflux disease), Headache, History of degenerative disc disease, Hyperlipidemia, Hypertension, and Sleep apnea. She also  has a past surgical history that includes Abdominal surgery; Abdominal hysterectomy; Esophagogastroduodenoscopy (egd) with propofol (N/A, 02/20/2017); Colonoscopy with propofol (N/A, 02/20/2017); Upper esophageal endoscopic ultrasound (eus) (N/A, 04/03/2017); Diverticulitis; EUS (N/A, 01/08/2018); Cholecystectomy (N/A, 04/27/2018); Cataract extraction w/PHACO (Left, 09/07/2019); and Cataract extraction w/PHACO (Right, 09/28/2019). Ms. CSommerfieldhas a current medication list which includes the following prescription(s): accu-chek guide, albuterol, aspirin, budesonide-formoterol, dexlansoprazole, famotidine, fesoterodine, fexofenadine, fluticasone, hydrocodone-acetaminophen, [START ON 05/11/2020] hydrocodone-acetaminophen, insulin regular, levemir flextouch, linaclotide, losartan, magnesium, metformin, mirtazapine, alive once daily womens, ondansetron, pregabalin, rizatriptan, rosuvastatin, solifenacin, topiramate, trazodone, venlafaxine, venlafaxine xr, and polyethylene glycol. She  reports that she has been smoking cigarettes. She has a 7.00  pack-year smoking history. She has quit using smokeless tobacco. She reports current alcohol use of about 1.0 standard drink of alcohol per week. She reports previous drug use. Ms. CLenderis allergic to tramadol, benadryl [diphenhydramine hcl], bextra [valdecoxib], invokana [canagliflozin], and sulfamethoxazole-trimethoprim.   HPI  Today, she is being contacted for a post-procedure assessment.  As well as worsening right knee pain  Postprocedural follow-up status  post right intra-articular knee steroid injection with 1.15 2022.  Patient did not notice any significant pain relief after injection and endorsed swelling of her knee as well as itching.  This could be a side effect of the steroid that we used.  Given no benefit, we discussed intra-articular discussed supplementation with Hyalgan for her right knee versus right knee genicular nerve block and possible RFA.  Patient would like to start with intra-articular Hyalgan series for her right knee.  If this is not effective, we could consider genicular nerve block and RFA.  Patient endorsed understanding.   Laboratory Chemistry Profile   Renal Lab Results  Component Value Date   BUN 15 08/09/2019   CREATININE 0.59 08/09/2019   BCR 14 11/27/2017   GFRAA >60 08/09/2019   GFRNONAA >60 08/09/2019     Hepatic Lab Results  Component Value Date   AST 25 08/09/2019   ALT 36 08/09/2019   ALBUMIN 3.9 08/09/2019   ALKPHOS 120 08/09/2019   LIPASE 19 08/09/2019     Electrolytes Lab Results  Component Value Date   NA 138 08/09/2019   K 4.2 08/09/2019   CL 108 08/09/2019   CALCIUM 8.8 (L) 08/09/2019   MG 1.8 11/27/2017     Bone No results found for: VD25OH, VD125OH2TOT, XA1287OM7, EH2094BS9, 25OHVITD1, 25OHVITD2, 25OHVITD3, TESTOFREE, TESTOSTERONE   Inflammation (CRP: Acute Phase) (ESR: Chronic Phase) Lab Results  Component Value Date   CRP <1 11/27/2017   ESRSEDRATE 17 11/27/2017   LATICACIDVEN 1.6 10/14/2016       Note: Above  Lab results reviewed.  Imaging  DG Knee 1-2 Views Right CLINICAL DATA:  Post fall, now with right knee pain.  EXAM: RIGHT KNEE - 1-2 VIEW  COMPARISON:  None.  FINDINGS: No fracture or dislocation. Severe tricompartmental degenerative change of the knee, worse with the medial compartment with near complete joint space loss, subchondral sclerosis, articular surface irregularity and osteophytosis. Small knee joint effusion. Enthesopathic change involving the superior pole the patella. No evidence of chondrocalcinosis. Regional soft tissues appear normal.  IMPRESSION: 1. Small knee joint effusion. Otherwise, no acute findings. 2. Severe tricompartmental degenerative change of the knee, worse within the medial compartment.  Electronically Signed   By: Sandi Mariscal M.D.   On: 03/29/2020 10:50  Assessment  The primary encounter diagnosis was Primary osteoarthritis of right knee. A diagnosis of Chronic pain syndrome was also pertinent to this visit.  Plan of Care  Ms. Davielle Lingelbach Orantes has a current medication list which includes the following long-term medication(s): albuterol, budesonide-formoterol, dexlansoprazole, famotidine, fexofenadine, insulin regular, linaclotide, losartan, metformin, mirtazapine, pregabalin, rizatriptan, topiramate, trazodone, venlafaxine, and venlafaxine xr.   Orders:  Orders Placed This Encounter  Procedures  . KNEE INJECTION    Hyalgan knee injection. Please order Hyalgan.    Standing Status:   Future    Standing Expiration Date:   05/18/2020    Scheduling Instructions:     Procedure: Intra-articular Hyalgan Knee injection            Side: RIGHT     Sedation: None     Timeframe: in two (2) weeks    Order Specific Question:   Where will this procedure be performed?    Answer:   [facility]   Follow-up plan:   Return in about 1 week (around 04/24/2020) for R knee Hyalgan #1.   Recent Visits Date Type Provider Dept  03/29/20 Procedure  visit Gillis Santa, [facility]  03/07/20 Office  Visit Gillis Santa, MD [facility]  Showing recent visits within past 90 days and meeting all other requirements Today's Visits Date Type Provider Dept  04/17/20 Telemedicine Gillis Santa, MD [facility]  Showing today's visits and meeting all other requirements Future Appointments Date Type Provider Dept  06/06/20 Appointment Gillis Santa, MD [facility]  Showing future appointments within next 90 days and meeting all other requirements  I discussed the assessment and treatment plan with the patient. The patient was provided an opportunity to ask questions and all were answered. The patient agreed with the plan and demonstrated an understanding of the instructions.  Patient advised to call back or seek an in-person evaluation if the symptoms or condition worsens.  Duration of encounter: 92mnutes.  Note by: BGillis Santa MD Date: 04/17/2020; Time: 12:40 PM

## 2020-04-26 ENCOUNTER — Ambulatory Visit
Payer: Medicaid Other | Attending: Student in an Organized Health Care Education/Training Program | Admitting: Student in an Organized Health Care Education/Training Program

## 2020-04-26 ENCOUNTER — Encounter: Payer: Self-pay | Admitting: Student in an Organized Health Care Education/Training Program

## 2020-04-26 ENCOUNTER — Other Ambulatory Visit: Payer: Self-pay

## 2020-04-26 VITALS — BP 100/74 | HR 105 | Temp 98.0°F | Resp 18 | Ht 59.0 in | Wt 131.0 lb

## 2020-04-26 DIAGNOSIS — G894 Chronic pain syndrome: Secondary | ICD-10-CM | POA: Diagnosis present

## 2020-04-26 DIAGNOSIS — M1711 Unilateral primary osteoarthritis, right knee: Secondary | ICD-10-CM | POA: Insufficient documentation

## 2020-04-26 MED ORDER — LIDOCAINE HCL 2 % IJ SOLN
20.0000 mL | Freq: Once | INTRAMUSCULAR | Status: AC
  Administered 2020-04-26: 400 mg
  Filled 2020-04-26: qty 20

## 2020-04-26 MED ORDER — SODIUM HYALURONATE (VISCOSUP) 20 MG/2ML IX SOSY
2.0000 mL | PREFILLED_SYRINGE | Freq: Once | INTRA_ARTICULAR | Status: AC
  Administered 2020-04-26: 2 mL via INTRA_ARTICULAR

## 2020-04-26 MED ORDER — LIDOCAINE HCL (PF) 2 % IJ SOLN
INTRAMUSCULAR | Status: AC
  Filled 2020-04-26: qty 5

## 2020-04-26 NOTE — Progress Notes (Signed)
PROVIDER NOTE: Information contained herein reflects review and annotations entered in association with encounter. Interpretation of such information and data should be left to medically-trained personnel. Information provided to patient can be located elsewhere in the medical record under "Patient Instructions". Document created using STT-dictation technology, any transcriptional errors that may result from process are unintentional.    Patient: [name]  Service Category: Procedure  Provider: Edward Jolly, MD  DOB: 03/20/1961  DOS: 04/26/2020  Location: [facility]  MRN: [id number]  Setting: Ambulatory - outpatient  Referring Provider: Rayetta Humphrey, MD  Type: Established Patient  Specialty: Interventional Pain Management  PCP: Rayetta Humphrey, MD   Primary Reason for Visit: Interventional Pain Management Treatment. CC: Knee Pain (right)  Procedure:          Anesthesia, Analgesia, Anxiolysis:  Type: Diagnostic Intra-Articular Hyalgan Knee Injection #1  Region: Medial infrapatellar Knee Region Level: Knee Joint Laterality: Right knee  Type: Local Anesthesia Indication(s): Analgesia         Local Anesthetic: Lidocaine 1-2% Route: Infiltration ([place]/IM) IV Access: Declined Sedation: Declined   Position: Sitting   Indications: 1. Primary osteoarthritis of right knee   2. Chronic pain syndrome    Pain Score: Pre-procedure: 8 /10 Post-procedure: 2 /10   Pre-op H&P Assessment:  [name] is a 60 y.o. (year old), female patient, seen today for interventional treatment. She  has a past surgical history that includes Abdominal surgery; Abdominal hysterectomy; Esophagogastroduodenoscopy (egd) with propofol (N/A, 02/20/2017); Colonoscopy with propofol (N/A, 02/20/2017); Upper esophageal endoscopic ultrasound (eus) (N/A, 04/03/2017); Diverticulitis; EUS (N/A, 01/08/2018); Cholecystectomy (N/A, 04/27/2018); Cataract extraction w/PHACO (Left, 09/07/2019); and Cataract  extraction w/PHACO (Right, 09/28/2019). [name] has a current medication list which includes the following prescription(s): accu-chek guide, albuterol, aspirin, budesonide-formoterol, dexlansoprazole, famotidine, fesoterodine, fexofenadine, fluticasone, hydrocodone-acetaminophen, [START ON 05/11/2020] hydrocodone-acetaminophen, insulin regular, levemir flextouch, linaclotide, losartan, magnesium, metformin, mirtazapine, alive once daily womens, ondansetron, polyethylene glycol, pregabalin, rizatriptan, rosuvastatin, solifenacin, topiramate, trazodone, venlafaxine xr, and venlafaxine. Her primarily concern today is the Knee Pain (right)  Initial Vital Signs:  Pulse/HCG Rate: (!) 105  Temp: (!) 97.2 F (36.2 C) Resp: 18 BP: 110/87 SpO2: 100 %  BMI: Estimated body mass index is 26.46 kg/m as calculated from the following:   Height as of this encounter: 4\' 11"  (1.499 m).   Weight as of this encounter: 131 lb (59.4 kg).  Risk Assessment: Allergies: Reviewed. She is allergic to tramadol, benadryl [diphenhydramine hcl], bextra [valdecoxib], invokana [canagliflozin], and sulfamethoxazole-trimethoprim.  Allergy Precautions: None required Coagulopathies: Reviewed. None identified.  Blood-thinner therapy: None at this time Active Infection(s): Reviewed. None identified. [name] is afebrile  Site Confirmation: [name] was asked to confirm the procedure and laterality before marking the site Procedure checklist: Completed Consent: Before the procedure and under the influence of no sedative(s), amnesic(s), or anxiolytics, the patient was informed of the treatment options, risks and possible complications. To fulfill our ethical and legal obligations, as recommended by the American Medical Association's Code of Ethics, I have informed the patient of my clinical impression; the nature and purpose of the treatment or procedure; the risks, benefits, and possible complications of the  intervention; the alternatives, including doing nothing; the risk(s) and benefit(s) of the alternative treatment(s) or procedure(s); and the risk(s) and benefit(s) of doing nothing. The patient was provided information about the general risks and possible complications associated with the procedure. These may include, but are not limited to: failure to achieve desired goals, infection, bleeding, organ  or nerve damage, allergic reactions, paralysis, and death. In addition, the patient was informed of those risks and complications associated to the procedure, such as failure to decrease pain; infection; bleeding; organ or nerve damage with subsequent damage to sensory, motor, and/or autonomic systems, resulting in permanent pain, numbness, and/or weakness of one or several areas of the body; allergic reactions; (i.e.: anaphylactic reaction); and/or death. Furthermore, the patient was informed of those risks and complications associated with the medications. These include, but are not limited to: allergic reactions (i.e.: anaphylactic or anaphylactoid reaction(s)); adrenal axis suppression; blood sugar elevation that in diabetics may result in ketoacidosis or comma; water retention that in patients with history of congestive heart failure may result in shortness of breath, pulmonary edema, and decompensation with resultant heart failure; weight gain; swelling or edema; medication-induced neural toxicity; particulate matter embolism and blood vessel occlusion with resultant organ, and/or nervous system infarction; and/or aseptic necrosis of one or more joints. Finally, the patient was informed that Medicine is not an exact science; therefore, there is also the possibility of unforeseen or unpredictable risks and/or possible complications that may result in a catastrophic outcome. The patient indicated having understood very clearly. We have given the patient no guarantees and we have made no promises. Enough time  was given to the patient to ask questions, all of which were answered to the patient's satisfaction. [name] has indicated that she wanted to continue with the procedure. Attestation: I, the ordering provider, attest that I have discussed with the patient the benefits, risks, side-effects, alternatives, likelihood of achieving goals, and potential problems during recovery for the procedure that I have provided informed consent. Date  Time: 04/26/2020  8:38 AM  Pre-Procedure Preparation:  Monitoring: As per clinic protocol. Respiration, ETCO2, SpO2, BP, heart rate and rhythm monitor placed and checked for adequate function Safety Precautions: Patient was assessed for positional comfort and pressure points before starting the procedure. Time-out: I initiated and conducted the "Time-out" before starting the procedure, as per protocol. The patient was asked to participate by confirming the accuracy of the "Time Out" information. Verification of the correct person, site, and procedure were performed and confirmed by me, the nursing staff, and the patient. "Time-out" conducted as per Joint Commission's Universal Protocol (UP.01.01.01). Time: 0911  Description of Procedure:          Target Area: Knee Joint Approach: Just above the Medial tibial plateau, lateral to the infrapatellar tendon. Area Prepped: Entire knee area, from the mid-thigh to the mid-shin. DuraPrep (Iodine Povacrylex [0.7% available iodine] and Isopropyl Alcohol, 74% w/w) Safety Precautions: Aspiration looking for blood return was conducted prior to all injections. At no point did we inject any substances, as a needle was being advanced. No attempts were made at seeking any paresthesias. Safe injection practices and needle disposal techniques used. Medications properly checked for expiration dates. SDV (single dose vial) medications used. Description of the Procedure: Protocol guidelines were followed. The patient was placed in  position over the fluoroscopy table. The target area was identified and the area prepped in the usual manner. Skin & deeper tissues infiltrated with local anesthetic. Appropriate amount of time allowed to pass for local anesthetics to take effect. The procedure needles were then advanced to the target area. Proper needle placement secured. Negative aspiration confirmed. Solution injected in intermittent fashion, asking for systemic symptoms every 0.5cc of injectate. The needles were then removed and the area cleansed, making sure to leave some of the prepping solution back to  take advantage of its long term bactericidal properties. Vitals:   04/26/20 0839 04/26/20 0841 04/26/20 0916  BP:  110/87 100/74  Pulse:   (!) 105  Resp: 18  18  Temp:  (!) 97.2 F (36.2 C) 98 F (36.7 C)  SpO2:   100%  Weight: 131 lb (59.4 kg)    Height: 4\' 11"  (1.499 m)      Start Time: 0912 hrs. End Time: 0914 hrs. Materials:  Needle(s) Type: Regular needle Gauge: 25G Length: 1.5-in Medication(s): Please see orders for medications and dosing details.  Imaging Guidance:          Type of Imaging Technique: None used Indication(s): N/A Exposure Time: No patient exposure Contrast: None used. Fluoroscopic Guidance: N/A Ultrasound Guidance: N/A Interpretation: N/A  Antibiotic Prophylaxis:   Anti-infectives (From admission, onward)   None     Indication(s): None identified  Post-operative Assessment:  Post-procedure Vital Signs:  Pulse/HCG Rate: (!) 105  Temp: 98 F (36.7 C) Resp: 18 BP: 100/74 SpO2: 100 %  EBL: None  Complications: No immediate post-treatment complications observed by team, or reported by patient.  Note: The patient tolerated the entire procedure well. A repeat set of vitals were taken after the procedure and the patient was kept under observation following institutional policy, for this type of procedure. Post-procedural neurological assessment was performed, showing return to  baseline, prior to discharge. The patient was provided with post-procedure discharge instructions, including a section on how to identify potential problems. Should any problems arise concerning this procedure, the patient was given instructions to immediately contact us, at any time, without hesitation. In any case, we plan to contact the patient by telephone for a follow-up status report regarding this interventional procedure.  Comments:  No additional relevant information.  Plan of Care  Orders:  Orders Placed This Encounter  Procedures  . KNEE INJECTION    Hyalgan knee injection. Please order Hyalgan.    Standing Status:   Future    Standing Expiration Date:   05/24/2020    Scheduling Instructions:     Procedure: Intra-articular Hyalgan Knee injection            Side: R     Sedation: None     Timeframe: in 4 week    Order Specific Question:   Where will this procedure be performed?    Answer:   [facility]   Chronic Opioid Analgesic:  Norco 7.5 mg TID PRN #90/month   Medications ordered for procedure: Meds ordered this encounter  Medications  . Sodium Hyaluronate SOSY 2 mL  . lidocaine (XYLOCAINE) 2 % (with pres) injection 400 mg   Medications administered: We administered Sodium Hyaluronate and lidocaine.  See the medical record for exact dosing, route, and time of administration.  Follow-up plan:   Return in about 4 weeks (around 05/24/2020) for right knee hyalgan #2.    Recent Visits Date Type Provider Dept  04/17/20 Telemedicine Edward Jolly, MD [facility]  03/29/20 Procedure visit Edward Jolly, MD [facility]  03/07/20 Office Visit Edward Jolly, MD [facility]  Showing recent visits within past 90 days and meeting all other requirements Today's Visits Date Type Provider Dept  04/26/20 Procedure visit Edward Jolly, MD [facility]  Showing today's visits and meeting all other requirements Future Appointments Date  Type Provider Dept  05/23/20 Appointment Edward Jolly, MD [facility]  06/06/20 Appointment Edward Jolly, MD [facility]  Showing future appointments within next  90 days and meeting all other requirements  Disposition: Discharge home  Discharge (Date  Time): 04/26/2020; 0920 hrs.   Primary Care Physician: Rayetta Humphrey, MD Location: [facility] Note by: Edward Jolly, MD Date: 04/26/2020; Time: 9:28 AM  Disclaimer:  Medicine is not an exact science. The only guarantee in medicine is that nothing is guaranteed. It is important to note that the decision to proceed with this intervention was based on the information collected from the patient. The Data and conclusions were drawn from the patient's questionnaire, the interview, and the physical examination. Because the information was provided in large part by the patient, it cannot be guaranteed that it has not been purposely or unconsciously manipulated. Every effort has been made to obtain as much relevant data as possible for this evaluation. It is important to note that the conclusions that lead to this procedure are derived in large part from the available data. Always take into account that the treatment will also be dependent on availability of resources and existing treatment guidelines, considered by other Pain Management Practitioners as being common knowledge and practice, at the time of the intervention. For Medico-Legal purposes, it is also important to point out that variation in procedural techniques and pharmacological choices are the acceptable norm. The indications, contraindications, technique, and results of the above procedure should only be interpreted and judged by a Board-Certified Interventional Pain Specialist with extensive familiarity and expertise in the same exact procedure and technique.

## 2020-04-26 NOTE — Progress Notes (Signed)
Safety precautions to be maintained throughout the outpatient stay will include: orient to surroundings, keep bed in low position, maintain call bell within reach at all times, provide assistance with transfer out of bed and ambulation.  

## 2020-04-27 ENCOUNTER — Telehealth: Payer: Self-pay | Admitting: *Deleted

## 2020-04-27 NOTE — Telephone Encounter (Signed)
Voicemail left with patient re; procedure on y esterday.  Asked to call if questions or concerns.

## 2020-05-15 ENCOUNTER — Ambulatory Visit (INDEPENDENT_AMBULATORY_CARE_PROVIDER_SITE_OTHER): Payer: Medicaid Other | Admitting: Internal Medicine

## 2020-05-15 VITALS — BP 105/71 | HR 97 | Temp 99.0°F | Resp 14 | Ht 59.0 in | Wt 132.0 lb

## 2020-05-15 DIAGNOSIS — Z7189 Other specified counseling: Secondary | ICD-10-CM | POA: Diagnosis not present

## 2020-05-15 DIAGNOSIS — E119 Type 2 diabetes mellitus without complications: Secondary | ICD-10-CM | POA: Diagnosis not present

## 2020-05-15 DIAGNOSIS — G4733 Obstructive sleep apnea (adult) (pediatric): Secondary | ICD-10-CM

## 2020-05-15 DIAGNOSIS — I10 Essential (primary) hypertension: Secondary | ICD-10-CM | POA: Diagnosis not present

## 2020-05-15 DIAGNOSIS — Z794 Long term (current) use of insulin: Secondary | ICD-10-CM

## 2020-05-15 DIAGNOSIS — Z9989 Dependence on other enabling machines and devices: Secondary | ICD-10-CM

## 2020-05-15 NOTE — Patient Instructions (Signed)

## 2020-05-15 NOTE — Progress Notes (Signed)
[facility] [address] [place], [place] [address]  Pulmonary Sleep Medicine   Office Visit Note  Patient Name: [name] DOB: Nov 20, 1960 MRN [id number]    Chief Complaint: Obstructive Sleep Apnea visit  Brief History:  [name] is seen today for follow up The patient has a 2 year history of sleep apnea. Patient has not been  using PAP nightly.  The patient feels better after sleeping with PAP. She had a fall, she had an illness, she was out of mask liners, she has multiple reasons why she has not used in the last several months. It is her intention to go back to using nightly.   The patient reports -benefit from PAP use. Reported sleepiness is  Mild and the Epworth Sleepiness Score is 0 out of 24. The patient does not  take naps. The patient complains of the following:  No complaints.  The compliance download shows  Very poor compliance with an average use time of 8 hours. The AHI is 2.  The patient does not complain of limb movements disrupting sleep. She is taking Lyrica for RLS ROS  General: (-) fever, (-) chills, (-) night sweat Nose and Sinuses: (-) nasal stuffiness or itchiness, (-) postnasal drip, (-) nosebleeds, (-) sinus trouble. Mouth and Throat: (-) sore throat, (-) hoarseness. Neck: (-) swollen glands, (-) enlarged thyroid, (-) neck pain. Respiratory: - cough, - shortness of breath, occasional wheezing. Neurologic: 2 days of numbness in two fingers on left hand, resolved now.  Psychiatric: anxiety controlled with medication.    Current Medication: Outpatient Encounter Medications as of 05/15/2020  Medication Sig  . amLODipine (NORVASC) 10 MG tablet Take 1 tablet by mouth daily.  . budesonide-formoterol (SYMBICORT) 160-4.5 MCG/ACT inhaler Inhale 2 puffs into the lungs 2 (two) times daily.  . cetirizine (ZYRTEC) 10 MG tablet Take by mouth.  . montelukast (SINGULAIR) 10 MG tablet Take 1 tablet by mouth daily.  Marland Kitchen topiramate (TOPAMAX) 25 MG tablet  Take 1 tablet by mouth 2 (two) times daily.  Marland Kitchen triamcinolone (KENALOG) 0.1 % Apply topically.  Marland Kitchen ACCU-CHEK GUIDE test strip USE TID  . albuterol (PROVENTIL HFA;VENTOLIN HFA) 108 (90 Base) MCG/ACT inhaler Inhale 1 puff into the lungs 2 (two) times daily as needed.  Marland Kitchen aspirin 81 MG tablet Take 81 mg by mouth daily.   . budesonide-formoterol (SYMBICORT) 160-4.5 MCG/ACT inhaler Inhale 2 puffs into the lungs 2 (two) times daily.  Marland Kitchen Dexlansoprazole 30 MG capsule Take 30 mg by mouth daily.  . famotidine (PEPCID) 40 MG tablet Take 40 mg by mouth at bedtime.  . fesoterodine (TOVIAZ) 8 MG TB24 tablet Take by mouth.  . fexofenadine (ALLEGRA) 180 MG tablet Take 180 mg by mouth daily.  . fluticasone (FLONASE) 50 MCG/ACT nasal spray Place into the nose.  Marland Kitchen HYDROcodone-acetaminophen (NORCO) 7.5-325 MG tablet Take 1 tablet by mouth every 8 (eight) hours as needed for severe pain. Must last 30 days.  . hydrOXYzine (ATARAX/VISTARIL) 50 MG tablet Take 50 mg by mouth 2 (two) times daily.  . insulin regular (NOVOLIN R) 100 units/mL injection Inject 12 Units into the skin 3 (three) times daily before meals.  Marland Kitchen LEVEMIR FLEXTOUCH 100 UNIT/ML Pen Inject 60 Units into the skin daily.   Marland Kitchen linaclotide (LINZESS) 290 MCG CAPS capsule Take 290 mcg by mouth daily before breakfast.  . losartan (COZAAR) 100 MG tablet Take 100 mg by mouth daily.  . Magnesium 500 MG CAPS Take 1 capsule (500 mg total) by mouth at  bedtime.  . metFORMIN (GLUCOPHAGE) 1000 MG tablet Take 1,000 mg by mouth 2 (two) times daily.   . mirtazapine (REMERON) 45 MG tablet Take 45 mg by mouth daily.  . Multiple Vitamins-Minerals (ALIVE ONCE DAILY WOMENS) TABS Take 1 tablet by mouth daily.   . ondansetron (ZOFRAN) 4 MG tablet Take 4 mg by mouth every 8 (eight) hours as needed for nausea or vomiting.   . polyethylene glycol (MIRALAX / GLYCOLAX) packet Take 17 g by mouth daily.  . pregabalin (LYRICA) 150 MG capsule Take 1 capsule (150 mg total) by mouth 2 (two)  times daily.  . rizatriptan (MAXALT) 10 MG tablet Take 10 mg by mouth as needed for migraine. May repeat in 2 hours if needed  . rosuvastatin (CRESTOR) 20 MG tablet Take 20 mg by mouth daily.  . solifenacin (VESICARE) 10 MG tablet Take 10 mg by mouth daily.  . sucralfate (CARAFATE) 1 g tablet TAKE 1 TABLET BY MOUTH THREE TIMES DAILY BEFORE MEALS. DISSOLVE TABLET IN 1 TABLESPOON OF WATER TO CREATE A SLURRY  . topiramate (TOPAMAX) 25 MG tablet Take 25 mg by mouth 2 (two) times daily.  . traZODone (DESYREL) 100 MG tablet Take 100 mg by mouth at bedtime.   . traZODone (DESYREL) 150 MG tablet Take 150 mg by mouth at bedtime.  Marland Kitchen venlafaxine (EFFEXOR) 75 MG tablet  (Patient not taking: Reported on 04/26/2020)  . venlafaxine XR (EFFEXOR-XR) 150 MG 24 hr capsule Take 150 mg by mouth daily.   No facility-administered encounter medications on file as of 05/15/2020.    Surgical History: Past Surgical History:  Procedure Laterality Date  . ABDOMINAL HYSTERECTOMY    . ABDOMINAL SURGERY    . CATARACT EXTRACTION W/PHACO Left 09/07/2019   Procedure: CATARACT EXTRACTION PHACO AND INTRAOCULAR LENS PLACEMENT (IOC) LEFT DIABETIC;  Surgeon: Galen Manila, MD;  Location: [facility];  Service: Ophthalmology;  Laterality: Left;  4.01 0:43.8  . CATARACT EXTRACTION W/PHACO Right 09/28/2019   Procedure: CATARACT EXTRACTION PHACO AND INTRAOCULAR LENS PLACEMENT (IOC) RIGHT DIABETIC 4.16  00:43.3;  Surgeon: Galen Manila, MD;  Location: [facility];  Service: Ophthalmology;  Laterality: Right;  Diabetic - insulin and oral meds  . CHOLECYSTECTOMY N/A 04/27/2018   Procedure: LAPAROSCOPIC CHOLECYSTECTOMY;  Surgeon: Carolan Shiver, MD;  Location: [facility];  Service: General;  Laterality: N/A;  . COLONOSCOPY WITH PROPOFOL N/A 02/20/2017   Procedure: COLONOSCOPY WITH PROPOFOL;  Surgeon: Christena Deem, MD;  Location: [facility];  Service: Endoscopy;  Laterality: N/A;  . Diverticulitis    .  ESOPHAGOGASTRODUODENOSCOPY (EGD) WITH PROPOFOL N/A 02/20/2017   Procedure: ESOPHAGOGASTRODUODENOSCOPY (EGD) WITH PROPOFOL;  Surgeon: Christena Deem, MD;  Location: [facility];  Service: Endoscopy;  Laterality: N/A;  . EUS N/A 01/08/2018   Procedure: UPPER ENDOSCOPIC ULTRASOUND (EUS) RADIAL;  Surgeon: Bearl Mulberry, MD;  Location: [facility];  Service: Gastroenterology;  Laterality: N/A;  . UPPER ESOPHAGEAL ENDOSCOPIC ULTRASOUND (EUS) N/A 04/03/2017   Procedure: UPPER ESOPHAGEAL ENDOSCOPIC ULTRASOUND (EUS);  Surgeon: Bearl Mulberry, MD;  Location: [facility];  Service: Gastroenterology;  Laterality: N/A;    Medical History: Past Medical History:  Diagnosis Date  . Asthma   . Asthma   . BPPV (benign paroxysmal positional vertigo)    none recently  . Cardiac murmur, unspecified 08/28/2016  . CPAP (continuous positive airway pressure) dependence   . Degenerative disc disease, lumbar   . Depression   . Diabetes mellitus   . Diverticulitis 10/2016  . Fatty liver   .  Fibromyalgia   . Fibromyalgia   . GERD (gastroesophageal reflux disease)   . Headache    migraines.  None since starting topamax  . History of degenerative disc disease   . Hyperlipidemia   . Hypertension   . Sleep apnea    CPAP    Family History: Non contributory to the present illness  Social History: Social History   Socioeconomic History  . Marital status: Divorced    Spouse name: Not on file  . Number of children: Not on file  . Years of education: Not on file  . Highest education level: Not on file  Occupational History  . Not on file  Tobacco Use  . Smoking status: Current Every Day Smoker    Packs/day: 0.50    Years: 14.00    Pack years: 7.00    Types: Cigarettes  . Smokeless tobacco: Former NeurosurgeonUser  . Tobacco comment: had recently quit for about 1 yr.  restarted several months ago  Vaping Use  . Vaping Use: Former  Substance and Sexual Activity  . Alcohol use: Yes     Alcohol/week: 1.0 standard drink    Types: 1 Glasses of wine per week  . Drug use: Not Currently    Comment: 4 years ago used Crack  . Sexual activity: Not on file  Other Topics Concern  . Not on file  Social History Narrative  . Not on file   Social Determinants of Health   Financial Resource Strain: Not on file  Food Insecurity: Not on file  Transportation Needs: Not on file  Physical Activity: Not on file  Stress: Not on file  Social Connections: Not on file  Intimate Partner Violence: Not on file    Vital Signs: Blood pressure 105/71, pulse 97, temperature 99 F (37.2 C), temperature source Temporal, resp. rate 14, height 4\' 11"  (1.499 m), weight 132 lb (59.9 kg), SpO2 (!) 86 %.  Examination: General Appearance: The patient is well-developed, well-nourished, and in no distress. Neck Circumference: 35 Skin: Gross inspection of skin unremarkable. Head: normocephalic, no gross deformities. Eyes: no gross deformities noted. ENT: ears appear grossly normal Neurologic: Alert and oriented. No involuntary movements.    EPWORTH SLEEPINESS SCALE:  Scale:  (0)= no chance of dozing; (1)= slight chance of dozing; (2)= moderate chance of dozing; (3)= high chance of dozing  Chance  Situtation    Sitting and reading: 0    Watching TV: 0    Sitting Inactive in public: 0    As a passenger in car: 0      Lying down to rest: 0    Sitting and talking: 0    Sitting quielty after lunch: 0    In a car, stopped in traffic: 0   TOTAL SCORE:   0 out of 24    SLEEP STUDIES:  1. 08/12/18 PSG - AHI 6.0, left lateral AHI 31.9, low SpO2 78%   CPAP COMPLIANCE DATA:  Date Range: 05/20/19 - 05/10/20  Average Daily Use: 2:22 hours  Median Use: 8:04  Compliance for > 4 Hours: 28%  AHI: 2.0 respiratory events per hour  Days Used: 105/357  Mask Leak: 10.3  95th Percentile Pressure: 7.0 cmh2o         LABS: Recent Results (from the past 2160 hour(s))   ToxASSURE Select 13 (MW), Urine     Status: None   Collection Time: 03/07/20  3:35 PM  Result Value Ref Range   Summary Note     Comment: ====================================================================  ToxASSURE Select 13 (MW) ==================================================================== Test                             Result       Flag       Units  Drug Present and Declared for Prescription Verification   Hydrocodone                    276          EXPECTED   ng/mg creat   Hydromorphone                  159          EXPECTED   ng/mg creat   Dihydrocodeine                 180          EXPECTED   ng/mg creat   Norhydrocodone                 523          EXPECTED   ng/mg creat    Sources of hydrocodone include scheduled prescription medications.    Hydromorphone, dihydrocodeine and norhydrocodone are expected    metabolites of hydrocodone. Hydromorphone and dihydrocodeine are    also available as scheduled prescription medications.  ==================================================================== Test                      Result    Flag   Units      Ref Range   Creatinine              74                mg/dL      >=40 ==================================================================== Declared Medications:  The flagging and interpretation on this report are based on the  following declared medications.  Unexpected results may arise from  inaccuracies in the declared medications.   **Note: The testing scope of this panel includes these medications:   Hydrocodone (Norco)   **Note: The testing scope of this panel does not include the  following reported medications:   Acetaminophen (Norco)  Albuterol (Ventolin HFA)  Aspirin  Budesonide (Symbicort)  Dexlansoprazole  Famotidine (Pepcid)  Fesoterodine (Toviaz)  Fexofenadine (Allegra)  Fluticasone (Flonase)  Formoterol (Symbicort)  Insulin (Levemir)  Iron  Linaclotide (Linzess)  Losartan (Cozaar)   Magnesium  Metformin  Mirtazapine (Remeron)  Multivitamin  Ondansetron (Zofran)  Polyethylene Glycol (MiraLAX)  Pregabalin (Lyrica)  Rizatriptan (Maxalt)  Rosuvastatin (Crestor)  Solifenacin (Vesi care)  Topiramate (Topamax)  Trazodone (Desyrel)  Venlafaxine (Effexor) ==================================================================== For clinical consultation, please call [phone]. ====================================================================     Radiology: DG Knee 1-2 Views Right  Result Date: 03/29/2020 CLINICAL DATA:  Post fall, now with right knee pain. EXAM: RIGHT KNEE - 1-2 VIEW COMPARISON:  None. FINDINGS: No fracture or dislocation. Severe tricompartmental degenerative change of the knee, worse with the medial compartment with near complete joint space loss, subchondral sclerosis, articular surface irregularity and osteophytosis. Small knee joint effusion. Enthesopathic change involving the superior pole the patella. No evidence of chondrocalcinosis. Regional soft tissues appear normal. IMPRESSION: 1. Small knee joint effusion. Otherwise, no acute findings. 2. Severe tricompartmental degenerative change of the knee, worse within the medial compartment. Electronically Signed   By: Simonne Come M.D.   On: 03/29/2020 10:50    No results found.  No results found.  Assessment and Plan: Patient Active Problem List   Diagnosis Date Noted  . Primary osteoarthritis of right knee 04/26/2020  . Anxiety 08/29/2019  . OSA (obstructive sleep apnea) 09/05/2018  . Pain in both hands 03/11/2018  . Calculus of gallbladder without cholecystitis without obstruction 03/04/2018  . Chronic constipation 03/04/2018  . Pelvic pain in female 03/04/2018  . Chronic constipation 03/04/2018  . Pharmacologic therapy 11/27/2017  . Disorder of skeletal system 11/27/2017  . Problems influencing health status 11/27/2017  . Aortic atherosclerosis (HCC) 11/26/2017  . Smoker 09/01/2017   . Lumbar radiculopathy 05/29/2017  . Chronic bilateral low back pain with bilateral sciatica 05/29/2017  . Chronic pain syndrome 05/29/2017  . Chronic, continuous use of opioids 05/29/2017  . Chronic myofascial pain 05/29/2017  . Primary osteoarthritis of both hips 03/11/2017  . Fatty liver disease, nonalcoholic 10/25/2016  . DDD (degenerative disc disease), lumbar 10/23/2016  . Diverticulitis 10/14/2016  . Cardiac murmur 08/28/2016  . Hyperlipidemia, mixed 05/29/2016  . Type 2 diabetes mellitus without complications (HCC) 05/29/2016  . COPD (chronic obstructive pulmonary disease) (HCC) 01/02/2016  . Essential hypertension 05/15/2015    1. OSA on CPAP The patient does tolerate PAP and reports  benefit from PAP use. The patient was reminded how to clean equipment and advised to use it consistently. . The patient was also counselled on remaining in contact with office if she needs supplies. . The compliance is poor. The apnea is controlled.   2. CPAP use counseling CPAP Counseling: had a lengthy discussion with the patient regarding the importance of PAP therapy in management of the sleep apnea. Patient appears to understand the risk factor reduction and also understands the risks associated with untreated sleep apnea. Patient will try to make a good faith effort to remain compliant with therapy. Also instructed the patient on proper cleaning of the device including the water must be changed daily if possible and use of distilled water is preferred. Patient understands that the machine should be regularly cleaned with appropriate recommended cleaning solutions that do not damage the PAP machine for example given white vinegar and water rinses. Other methods such as ozone treatment may not be as good as these simple methods to achieve cleaning.  3. Essential hypertension Controlled, she will continue with medication and close monitoring. Hypertension Counseling:   The following hypertensive  lifestyle modification were recommended and discussed:  1. Limiting alcohol intake to less than 1 oz/day of ethanol:(24 oz of beer or 8 oz of wine or 2 oz of 100-proof whiskey). 2. Take baby ASA 81 mg daily. 3. Importance of regular aerobic exercise and losing weight. 4. Reduce dietary saturated fat and cholesterol intake for overall cardiovascular health. 5. Maintaining adequate dietary potassium, calcium, and magnesium intake. 6. Regular monitoring of the blood pressure. 7. Reduce sodium intake to less than 100 mmol/day (less than 2.3 gm of sodium or less than 6 gm of sodium choride)   4. Type 2 diabetes mellitus without complication, with long-term current use of insulin (HCC) She is using a dexcom. Per pt is taking her medication consistently.   The patient does tolerate PAP and reports  benefit from PAP use. The patient was reminded how to clean equipment and advised to use it consistently. . The patient was also counselled on remaining in contact with office if she needs supplies. . The compliance is poor. The apnea is controlled.     General Counseling: I have discussed the findings of the evaluation and examination with  Liborio Nixon.  I have also discussed any further diagnostic evaluation thatmay be needed or ordered today. Jenevie verbalizes understanding of the findings of todays visit. We also reviewed her medications today and discussed drug interactions and side effects including but not limited excessive drowsiness and altered mental states. We also discussed that there is always a risk not just to her but also people around her. she has been encouraged to call the office with any questions or concerns that should arise related to todays visit.  No orders of the defined types were placed in this encounter.       I have personally obtained a history, examined the patient, evaluated laboratory and imaging results, formulated the assessment and plan and placed orders.  This patient was  seen today by Emmaline Kluver, PA-C in collaboration with Dr. Freda Munro.   Valentino Hue Sol Blazing, PhD, FAASM  Diplomate, American Board of Sleep Medicine    Yevonne Pax, MD [facility] Diplomate ABMS Pulmonary and Critical Care Medicine Sleep medicine

## 2020-05-23 ENCOUNTER — Encounter: Payer: Self-pay | Admitting: Student in an Organized Health Care Education/Training Program

## 2020-05-23 ENCOUNTER — Ambulatory Visit
Payer: Medicaid Other | Attending: Student in an Organized Health Care Education/Training Program | Admitting: Student in an Organized Health Care Education/Training Program

## 2020-05-23 ENCOUNTER — Other Ambulatory Visit: Payer: Self-pay

## 2020-05-23 VITALS — BP 139/99 | HR 101 | Temp 97.0°F | Resp 18 | Ht 59.0 in | Wt 134.0 lb

## 2020-05-23 DIAGNOSIS — G894 Chronic pain syndrome: Secondary | ICD-10-CM | POA: Diagnosis not present

## 2020-05-23 DIAGNOSIS — M5416 Radiculopathy, lumbar region: Secondary | ICD-10-CM | POA: Insufficient documentation

## 2020-05-23 DIAGNOSIS — M1711 Unilateral primary osteoarthritis, right knee: Secondary | ICD-10-CM | POA: Diagnosis not present

## 2020-05-23 MED ORDER — PREGABALIN 150 MG PO CAPS: 150.0000 mg | ORAL_CAPSULE | Freq: Two times a day (BID) | ORAL | 2 refills | Status: DC

## 2020-05-23 MED ORDER — HYDROCODONE-ACETAMINOPHEN 7.5-325 MG PO TABS: 1.0000 | ORAL_TABLET | Freq: Three times a day (TID) | ORAL | 0 refills | Status: AC | PRN

## 2020-05-23 MED ORDER — HYDROCODONE-ACETAMINOPHEN 7.5-325 MG PO TABS: 1.0000 | ORAL_TABLET | Freq: Three times a day (TID) | ORAL | 0 refills | Status: DC | PRN

## 2020-05-23 NOTE — Patient Instructions (Addendum)
Hydrocodone to last until 09/08/2020 and Lyrica has been escribed to your pharmacy.  Sodium Hyaluronate intra-articular injection What is this medicine? SODIUM HYALURONATE (SOE dee um hye al yoor ON ate) is used to treat pain in the knee due to osteoarthritis. This medicine may be used for other purposes; ask your health care provider or pharmacist if you have questions. COMMON BRAND NAME(S): Amvisc, DUROLANE, Euflexxa, GELSYN-3, Hyalgan, Hymovis, Monovisc, Orthovisc, Supartz, Supartz FX, TriVisc, VISCO What should I tell my health care provider before I take this medicine? They need to know if you have any of these conditions:  bleeding disorders  glaucoma  infection in the knee joint  skin conditions or sensitivity  skin infection  an unusual allergic reaction to sodium hyaluronate, other medicines, foods, dyes, or preservatives. Different brands of sodium hyaluronate contain different allergens. Some may contain egg. Talk to your doctor about your allergies to make sure that you get the right product.  pregnant or trying to get pregnant  breast-feeding How should I use this medicine? This medicine is for injection into the knee joint. It is given by a health care professional in a hospital or clinic setting. Talk to your pediatrician regarding the use of this medicine in children. Special care may be needed. Overdosage: If you think you have taken too much of this medicine contact a poison control center or emergency room at once. NOTE: This medicine is only for you. Do not share this medicine with others. What if I miss a dose? This does not apply. What may interact with this medicine? Interactions are not expected. This list may not describe all possible interactions. Give your health care provider a list of all the medicines, herbs, non-prescription drugs, or dietary supplements you use. Also tell them if you smoke, drink alcohol, or use illegal drugs. Some items may interact  with your medicine. What should I watch for while using this medicine? Tell your doctor or healthcare professional if your symptoms do not start to get better or if they get worse. If receiving this medicine for osteoarthritis, limit your activity after you receive your injection. Avoid physical activity for 48 hours following your injection to keep your knee from swelling. Do not stand on your feet for more than 1 hour at a time during the first 48 hours following your injection. Ask your doctor or healthcare professional about when you can begin major physical activity again. What side effects may I notice from receiving this medicine? Side effects that you should report to your doctor or health care professional as soon as possible:  allergic reactions like skin rash, itching or hives, swelling of the face, lips, or tongue  dizziness  facial flushing  pain, tingling, numbness in the hands or feet  vision changes if received this medicine during eye surgery Side effects that usually do not require medical attention (report to your doctor or health care professional if they continue or are bothersome):  back pain  bruising at site where injected  chills  diarrhea  fever  headache  joint pain  joint stiffness  joint swelling  muscle cramps  muscle pain  nausea, vomiting  pain, redness, or irritation at site where injected  weak or tired This list may not describe all possible side effects. Call your doctor for medical advice about side effects. You may report side effects to FDA at 1-800-FDA-1088. Where should I keep my medicine? This drug is given in a hospital or clinic and will not be  stored at home. NOTE: This sheet is a summary. It may not cover all possible information. If you have questions about this medicine, talk to your doctor, pharmacist, or health care provider.  2021 Elsevier/Gold Standard (2015-04-13 08:34:51)

## 2020-05-23 NOTE — Progress Notes (Signed)
PROVIDER NOTE: Information contained herein reflects review and annotations entered in association with encounter. Interpretation of such information and data should be left to medically-trained personnel. Information provided to patient can be located elsewhere in the medical record under "Patient Instructions". Document created using STT-dictation technology, any transcriptional errors that may result from process are unintentional.    Patient: [name]  Service Category: E/M  Provider: Gillis Santa, MD  DOB: December 23, 1960  DOS: 05/23/2020  Specialty: Interventional Pain Management  MRN: [id number]  Setting: Ambulatory outpatient  PCP: Sharyne Peach, MD  Type: Established Patient    Referring Provider: Sharyne Peach, MD  Location: Office  Delivery: Face-to-face     HPI  [name], a 60 y.o. year old female, is here today because of her Primary osteoarthritis of right knee [M17.11]. [name]'s primary complain today is Knee Pain (right) Last encounter: My last encounter with her was on 04/26/2020. Pertinent problems: [name] has Lumbar radiculopathy; Chronic bilateral low back pain with bilateral sciatica; Chronic pain syndrome; Chronic, continuous use of opioids; Chronic myofascial pain; Primary osteoarthritis of both hips; Type 2 diabetes mellitus without complications ([place]); and Pain in both hands on their pertinent problem list. Pain Assessment: Severity of   is reported as a 0-No pain/10. Location: Knee Right/ . Onset:  . Quality:  . Timing:  . Modifying factor(s): hyalgan. Vitals:  height is '4\' 11"'  (1.499 m) and weight is 134 lb (60.8 kg). Her temporal temperature is 97 F (36.1 C) (abnormal). Her blood pressure is 139/99 (abnormal) and her pulse is 101 (abnormal). Her respiration is 18 and oxygen saturation is 93%.   Reason for encounter: both, medication management and post-procedure assessment.   Patient states that her right knee pain is much better  since her knee Hyalgan injection.  See details below.  Otherwise presents today for medication management for chronic pain syndrome.   Post-Procedure Evaluation  Procedure (04/26/2020): Right Hyalgan injection Sedation: Please see nurses note.  Effectiveness during initial hour after procedure(Ultra-Short Term Relief): 0 %   Local anesthetic used: Long-acting (4-6 hours) Effectiveness: Defined as any analgesic benefit obtained secondary to the administration of local anesthetics. This carries significant diagnostic value as to the etiological location, or anatomical origin, of the pain. Duration of benefit is expected to coincide with the duration of the local anesthetic used.  Effectiveness during initial 4-6 hours after procedure(Short-Term Relief): 0 %   Long-term benefit: Defined as any relief past the pharmacologic duration of the local anesthetics.  Effectiveness past the initial 6 hours after procedure(Long-Term Relief): 90 % (Began to feel relief 2 days after injection)   Current benefits: Defined as benefit that persist at this time.   Analgesia:  90-100% better Function: [name] reports improvement in function ROM: [name] reports improvement in ROM   Pharmacotherapy Assessment   Analgesic: Norco 7.5 mg TID PRN #90/month   Monitoring: [place] PMP: PDMP reviewed during this encounter.       Pharmacotherapy: No side-effects or adverse reactions reported. Compliance: No problems identified. Effectiveness: Clinically acceptable.  Rise Patience, RN  05/23/2020  8:55 AM  Sign when Signing Visit Safety precautions to be maintained throughout the outpatient stay will include: orient to surroundings, keep bed in low position, maintain call bell within reach at all times, provide assistance with transfer out of bed and ambulation.     UDS:  Summary  Date Value Ref Range Status  03/07/2020 Note  Final    Comment:     ====================================================================  [facility] (MW) ==================================================================== Test                             Result       Flag       Units  Drug Present and Declared for Prescription Verification   Hydrocodone                    276          EXPECTED   ng/mg creat   Hydromorphone                  159          EXPECTED   ng/mg creat   Dihydrocodeine                 180          EXPECTED   ng/mg creat   Norhydrocodone                 523          EXPECTED   ng/mg creat    Sources of hydrocodone include scheduled prescription medications.    Hydromorphone, dihydrocodeine and norhydrocodone are expected    metabolites of hydrocodone. Hydromorphone and dihydrocodeine are    also available as scheduled prescription medications.  ==================================================================== Test                      Result    Flag   Units      Ref Range   Creatinine              74               mg/dL      >=20 ==================================================================== Declared Medications:  The flagging and interpretation on this report are based on the  following declared medications.  Unexpected results may arise from  inaccuracies in the declared medications.   **Note: The testing scope of this panel includes these medications:   Hydrocodone (Norco)   **Note: The testing scope of this panel does not include the  following reported medications:   Acetaminophen (Norco)  Albuterol (Ventolin HFA)  Aspirin  Budesonide (Symbicort)  Dexlansoprazole  Famotidine (Pepcid)  Fesoterodine (Toviaz)  Fexofenadine (Allegra)  Fluticasone (Flonase)  Formoterol (Symbicort)  Insulin (Levemir)  Iron  Linaclotide (Linzess)  Losartan (Cozaar)  Magnesium  Metformin  Mirtazapine (Remeron)  Multivitamin  Ondansetron (Zofran)  Polyethylene Glycol (MiraLAX)  Pregabalin (Lyrica)  Rizatriptan  (Maxalt)  Rosuvastatin (Crestor)  Solifenacin (Vesicare)  Topiramate (Topamax)  Trazodone (Desyrel)  Venlafaxine (Effexor) ==================================================================== For clinical consultation, please call [phone]. ====================================================================      ROS  Constitutional: Denies any fever or chills Gastrointestinal: No reported hemesis, hematochezia, vomiting, or acute GI distress Musculoskeletal: Denies any acute onset joint swelling, redness, loss of ROM, or weakness Neurological: No reported episodes of acute onset apraxia, aphasia, dysarthria, agnosia, amnesia, paralysis, loss of coordination, or loss of consciousness  Medication Review  Alive Once Daily Womens, Dexlansoprazole, HYDROcodone-acetaminophen, Magnesium, albuterol, amLODipine, aspirin, budesonide-formoterol, cetirizine, famotidine, fesoterodine, fexofenadine, fluticasone, glucose blood, hydrOXYzine, insulin detemir, insulin regular, linaclotide, losartan, metFORMIN, mirtazapine, montelukast, ondansetron, polyethylene glycol, pregabalin, rizatriptan, rosuvastatin, solifenacin, sucralfate, topiramate, traZODone, triamcinolone, and venlafaxine XR  History Review  Allergy: [name] is allergic to tramadol, benadryl [diphenhydramine hcl], bextra [valdecoxib], invokana [canagliflozin], and sulfamethoxazole-trimethoprim. Drug: [name]  reports previous drug use. Alcohol:  reports current alcohol use  of about 1.0 standard drink of alcohol per week. Tobacco:  reports that she has been smoking cigarettes. She has a 7.00 pack-year smoking history. She has quit using smokeless tobacco. Social: [name]  reports that she has been smoking cigarettes. She has a 7.00 pack-year smoking history. She has quit using smokeless tobacco. She reports current alcohol use of about 1.0 standard drink of alcohol per week. She reports previous drug use. Medical:   has a past medical history of Asthma, Asthma, BPPV (benign paroxysmal positional vertigo), Cardiac murmur, unspecified (08/28/2016), CPAP (continuous positive airway pressure) dependence, Degenerative disc disease, lumbar, Depression, Diabetes mellitus, Diverticulitis (10/2016), Fatty liver, Fibromyalgia, Fibromyalgia, GERD (gastroesophageal reflux disease), Headache, History of degenerative disc disease, Hyperlipidemia, Hypertension, and Sleep apnea. Surgical: [name]  has a past surgical history that includes Abdominal surgery; Abdominal hysterectomy; Esophagogastroduodenoscopy (egd) with propofol (N/A, 02/20/2017); Colonoscopy with propofol (N/A, 02/20/2017); Upper esophageal endoscopic ultrasound (eus) (N/A, 04/03/2017); Diverticulitis; EUS (N/A, 01/08/2018); Cholecystectomy (N/A, 04/27/2018); Cataract extraction w/PHACO (Left, 09/07/2019); and Cataract extraction w/PHACO (Right, 09/28/2019). Family: family history includes Diabetes in her mother; Hypertension in her mother.  Laboratory Chemistry Profile   Renal Lab Results  Component Value Date   BUN 15 08/09/2019   CREATININE 0.59 08/09/2019   BCR 14 11/27/2017   GFRAA >60 08/09/2019   GFRNONAA >60 08/09/2019     Hepatic Lab Results  Component Value Date   AST 25 08/09/2019   ALT 36 08/09/2019   ALBUMIN 3.9 08/09/2019   ALKPHOS 120 08/09/2019   LIPASE 19 08/09/2019     Electrolytes Lab Results  Component Value Date   NA 138 08/09/2019   K 4.2 08/09/2019   CL 108 08/09/2019   CALCIUM 8.8 (L) 08/09/2019   MG 1.8 11/27/2017     Bone No results found for: VD25OH, VD125OH2TOT, DG3875IE3, PI9518AC1, 25OHVITD1, 25OHVITD2, 25OHVITD3, TESTOFREE, TESTOSTERONE   Inflammation (CRP: Acute Phase) (ESR: Chronic Phase) Lab Results  Component Value Date   CRP <1 11/27/2017   ESRSEDRATE 17 11/27/2017   LATICACIDVEN 1.6 10/14/2016       Note: Above Lab results reviewed.  Recent Imaging Review  DG Knee 1-2 Views Right CLINICAL  DATA:  Post fall, now with right knee pain.  EXAM: RIGHT KNEE - 1-2 VIEW  COMPARISON:  None.  FINDINGS: No fracture or dislocation. Severe tricompartmental degenerative change of the knee, worse with the medial compartment with near complete joint space loss, subchondral sclerosis, articular surface irregularity and osteophytosis. Small knee joint effusion. Enthesopathic change involving the superior pole the patella. No evidence of chondrocalcinosis. Regional soft tissues appear normal.  IMPRESSION: 1. Small knee joint effusion. Otherwise, no acute findings. 2. Severe tricompartmental degenerative change of the knee, worse within the medial compartment.  Electronically Signed   By: Sandi Mariscal M.D.   On: 03/29/2020 10:50 Note: Reviewed        Physical Exam  General appearance: Well nourished, well developed, and well hydrated. In no apparent acute distress Mental status: Alert, oriented x 3 (person, place, & time)       Respiratory: No evidence of acute respiratory distress Eyes: PERLA Vitals: BP (!) 139/99   Pulse (!) 101   Temp (!) 97 F (36.1 C) (Temporal)   Resp 18   Ht '4\' 11"'  (1.499 m)   Wt 134 lb (60.8 kg)   LMP  (LMP Unknown) Comment: "many years"  SpO2 93%   BMI 27.06 kg/m  BMI: Estimated body mass index is 27.06 kg/m as calculated from  the following:   Height as of this encounter: '4\' 11"'  (1.499 m).   Weight as of this encounter: 134 lb (60.8 kg). Ideal: Patient must be at least 60 in tall to calculate ideal body weight  Lower Extremity Exam    Side: Right lower extremity  Side: Left lower extremity  Stability: No instability observed          Stability: No instability observed          Skin & Extremity Inspection: Skin color, temperature, and hair growth are WNL. No peripheral edema or cyanosis. No masses, redness, swelling, asymmetry, or associated skin lesions. No contractures.  Skin & Extremity Inspection: Skin color, temperature, and hair growth are  WNL. No peripheral edema or cyanosis. No masses, redness, swelling, asymmetry, or associated skin lesions. No contractures.  Functional ROM: Pain restricted ROM for hip and knee joints, improved after treatment          Functional ROM: Unrestricted ROM                  Muscle Tone/Strength: Functionally intact. No obvious neuro-muscular anomalies detected.  Muscle Tone/Strength: Functionally intact. No obvious neuro-muscular anomalies detected.  Sensory (Neurological): Improved        Sensory (Neurological): Unimpaired        DTR: Patellar: deferred today Achilles: deferred today Plantar: deferred today  DTR: Patellar: deferred today Achilles: deferred today Plantar: deferred today  Palpation: No palpable anomalies  Palpation: No palpable anomalies     Assessment   Status Diagnosis  Controlled Controlled Controlled 1. Primary osteoarthritis of right knee   2. Chronic pain syndrome   3. Lumbar radiculopathy        Plan of Care  [name] has a current medication list which includes the following long-term medication(s): albuterol, budesonide-formoterol, budesonide-formoterol, dexlansoprazole, famotidine, fexofenadine, insulin regular, linaclotide, losartan, metformin, mirtazapine, rizatriptan, topiramate, topiramate, trazodone, trazodone, venlafaxine xr, and pregabalin.  Pharmacotherapy (Medications Ordered): Meds ordered this encounter  Medications  . HYDROcodone-acetaminophen (NORCO) 7.5-325 MG tablet    Sig: Take 1 tablet by mouth every 8 (eight) hours as needed for severe pain. Must last 30 days.    Dispense:  90 tablet    Refill:  0    Chronic Pain. (STOP Act - Not applicable). Fill one day early if closed on scheduled refill date.  Marland Kitchen HYDROcodone-acetaminophen (NORCO) 7.5-325 MG tablet    Sig: Take 1 tablet by mouth every 8 (eight) hours as needed for severe pain. Must last 30 days.    Dispense:  90 tablet    Refill:  0    Chronic Pain. (STOP Act - Not  applicable). Fill one day early if closed on scheduled refill date.  Marland Kitchen HYDROcodone-acetaminophen (NORCO) 7.5-325 MG tablet    Sig: Take 1 tablet by mouth every 8 (eight) hours as needed for severe pain. Must last 30 days.    Dispense:  90 tablet    Refill:  0    Chronic Pain. (STOP Act - Not applicable). Fill one day early if closed on scheduled refill date.  . pregabalin (LYRICA) 150 MG capsule    Sig: Take 1 capsule (150 mg total) by mouth 2 (two) times daily.    Dispense:  60 capsule    Refill:  2    Fill one day early if pharmacy is closed on scheduled refill date. May substitute for generic if available.   Orders:  Orders Placed This Encounter  Procedures  . KNEE INJECTION  For knee pain. Hyalgan Knee injection(s). Please order hyalgan to be available.    Standing Status:   Standing    Number of Occurrences:   5    Standing Expiration Date:   05/23/2021    Scheduling Instructions:     Procedure: Intra-articular Hyalgan Knee injection            Side(s): RIGHT     Sedation: None     TIMEFRAME: PRN procedure. (Ms. Ebling will call when needed.)    Order Specific Question:   Where will this procedure be performed?    Answer:   [facility]   Follow-up plan:   Return in about 3 months (around 08/24/2020) for Medication Management, in person.   Recent Visits Date Type Provider Dept  04/26/20 Procedure visit Gillis Santa, MD [facility]  04/17/20 Telemedicine Gillis Santa, MD [facility]  03/29/20 Procedure visit Gillis Santa, MD [facility]  03/07/20 Office Visit Gillis Santa, MD [facility]  Showing recent visits within past 90 days and meeting all other requirements Today's Visits Date Type Provider Dept  05/23/20 Office Visit Gillis Santa, MD [facility]  Showing today's visits and meeting all other requirements Future Appointments Date Type Provider Dept  08/15/20 Appointment Gillis Santa, MD [facility]  Showing future appointments within next 90 days and meeting all other requirements  I discussed the assessment and treatment plan with the patient. The patient was provided an opportunity to ask questions and all were answered. The patient agreed with the plan and demonstrated an understanding of the instructions.  Patient advised to call back or seek an in-person evaluation if the symptoms or condition worsens.  Duration of encounter: 30 minutes.  Note by: Gillis Santa, MD Date: 05/23/2020; Time: 9:57 AM

## 2020-05-23 NOTE — Progress Notes (Signed)
Safety precautions to be maintained throughout the outpatient stay will include: orient to surroundings, keep bed in low position, maintain call bell within reach at all times, provide assistance with transfer out of bed and ambulation.  

## 2020-05-30 DIAGNOSIS — E538 Deficiency of other specified B group vitamins: Secondary | ICD-10-CM | POA: Insufficient documentation

## 2020-06-06 ENCOUNTER — Encounter: Payer: Medicaid Other | Admitting: Student in an Organized Health Care Education/Training Program

## 2020-06-10 ENCOUNTER — Other Ambulatory Visit: Payer: Self-pay | Admitting: Student in an Organized Health Care Education/Training Program

## 2020-07-05 ENCOUNTER — Other Ambulatory Visit: Payer: Self-pay

## 2020-07-05 ENCOUNTER — Encounter: Payer: Self-pay | Admitting: Student in an Organized Health Care Education/Training Program

## 2020-07-05 ENCOUNTER — Ambulatory Visit
Payer: Medicaid Other | Attending: Student in an Organized Health Care Education/Training Program | Admitting: Student in an Organized Health Care Education/Training Program

## 2020-07-05 VITALS — BP 113/83 | HR 84 | Temp 97.0°F | Resp 16 | Ht 59.0 in | Wt 134.0 lb

## 2020-07-05 DIAGNOSIS — M1711 Unilateral primary osteoarthritis, right knee: Secondary | ICD-10-CM | POA: Insufficient documentation

## 2020-07-05 DIAGNOSIS — M1712 Unilateral primary osteoarthritis, left knee: Secondary | ICD-10-CM | POA: Diagnosis present

## 2020-07-05 DIAGNOSIS — G894 Chronic pain syndrome: Secondary | ICD-10-CM | POA: Diagnosis present

## 2020-07-05 MED ORDER — SODIUM HYALURONATE (VISCOSUP) 20 MG/2ML IX SOSY: 2.0000 mL | PREFILLED_SYRINGE | Freq: Once | INTRA_ARTICULAR | Status: AC

## 2020-07-05 MED ORDER — LIDOCAINE HCL 2 % IJ SOLN
20.0000 mL | Freq: Once | INTRAMUSCULAR | Status: AC
  Administered 2020-07-05: 200 mg

## 2020-07-05 MED ORDER — LIDOCAINE HCL (PF) 2 % IJ SOLN
INTRAMUSCULAR | Status: AC
  Filled 2020-07-05: qty 10

## 2020-07-05 NOTE — Progress Notes (Signed)
Safety precautions to be maintained throughout the outpatient stay will include: orient to surroundings, keep bed in low position, maintain call bell within reach at all times, provide assistance with transfer out of bed and ambulation.  

## 2020-07-05 NOTE — Progress Notes (Signed)
PROVIDER NOTE: Information contained herein reflects review and annotations entered in association with encounter. Interpretation of such information and data should be left to medically-trained personnel. Information provided to patient can be located elsewhere in the medical record under "Patient Instructions". Document created using STT-dictation technology, any transcriptional errors that may result from process are unintentional.    Patient: [name]  Service Category: Procedure  Provider: Edward Jolly, MD  DOB: 19-Nov-1960  DOS: 07/05/2020  Location: [facility]  MRN: [id number]  Setting: Ambulatory - outpatient  Referring Provider: Rayetta Humphrey, MD  Type: Established Patient  Specialty: Interventional Pain Management  PCP: Rayetta Humphrey, MD   Primary Reason for Visit: Interventional Pain Management Treatment. CC:  Right and left knee pain  Procedure:          Anesthesia, Analgesia, Anxiolysis:  Type: Therapeutic Intra-Articular Hyalgan Knee Injection #2  Region: Medial infrapatellar Knee Region Level: Knee Joint Laterality: Bilateral  Type: Local Anesthesia Indication(s): Analgesia         Local Anesthetic: Lidocaine 1-2% Route: Infiltration (Allen/IM) IV Access: Declined Sedation: Declined   Position: Sitting   Indications: 1. Primary osteoarthritis of right knee   2. Arthritis of left knee   3. Chronic pain syndrome    Pain Score: Pre-procedure: 7 /10 Post-procedure: 7 /10   Pre-op H&P Assessment:  [name] is a 60 y.o. (year old), female patient, seen today for interventional treatment. She  has a past surgical history that includes Abdominal surgery; Abdominal hysterectomy; Esophagogastroduodenoscopy (egd) with propofol (N/A, 02/20/2017); Colonoscopy with propofol (N/A, 02/20/2017); Upper esophageal endoscopic ultrasound (eus) (N/A, 04/03/2017); Diverticulitis; EUS (N/A, 01/08/2018); Cholecystectomy (N/A, 04/27/2018); Cataract extraction  w/PHACO (Left, 09/07/2019); and Cataract extraction w/PHACO (Right, 09/28/2019). [name] has a current medication list which includes the following prescription(s): accu-chek guide, albuterol, amlodipine, aripiprazole, aspirin, budesonide-formoterol, cetirizine, dexlansoprazole, famotidine, fesoterodine, fexofenadine, fluticasone, hydrocodone-acetaminophen, [START ON 07/10/2020] hydrocodone-acetaminophen, [START ON 08/09/2020] hydrocodone-acetaminophen, hydroxyzine, hydroxyzine, insulin regular, levemir flextouch, linaclotide, losartan, metformin, mirtazapine, montelukast, alive once daily womens, ondansetron, polyethylene glycol, pregabalin, propranolol, rizatriptan, rosuvastatin, solifenacin, sucralfate, tolterodine, topiramate, topiramate, trazodone, trazodone, triamcinolone cream, venlafaxine, budesonide-formoterol, and venlafaxine xr. Her primarily concern today is the Knee Pain (right)  Initial Vital Signs:  Pulse/HCG Rate: 84  Temp: (!) 97 F (36.1 C) Resp: 16 BP: 113/83 SpO2: 95 %  BMI: Estimated body mass index is 27.06 kg/m as calculated from the following:   Height as of this encounter: 4\' 11"  (1.499 m).   Weight as of this encounter: 134 lb (60.8 kg).  Risk Assessment: Allergies: Reviewed. She is allergic to tramadol, benadryl [diphenhydramine hcl], bextra [valdecoxib], invokana [canagliflozin], and sulfamethoxazole-trimethoprim.  Allergy Precautions: None required Coagulopathies: Reviewed. None identified.  Blood-thinner therapy: None at this time Active Infection(s): Reviewed. None identified. [name] is afebrile  Site Confirmation: [name] was asked to confirm the procedure and laterality before marking the site Procedure checklist: Completed Consent: Before the procedure and under the influence of no sedative(s), amnesic(s), or anxiolytics, the patient was informed of the treatment options, risks and possible complications. To fulfill our ethical and legal  obligations, as recommended by the American Medical Association's Code of Ethics, I have informed the patient of my clinical impression; the nature and purpose of the treatment or procedure; the risks, benefits, and possible complications of the intervention; the alternatives, including doing nothing; the risk(s) and benefit(s) of the alternative treatment(s) or procedure(s); and the risk(s) and benefit(s) of doing nothing. The patient was provided information about the  general risks and possible complications associated with the procedure. These may include, but are not limited to: failure to achieve desired goals, infection, bleeding, organ or nerve damage, allergic reactions, paralysis, and death. In addition, the patient was informed of those risks and complications associated to the procedure, such as failure to decrease pain; infection; bleeding; organ or nerve damage with subsequent damage to sensory, motor, and/or autonomic systems, resulting in permanent pain, numbness, and/or weakness of one or several areas of the body; allergic reactions; (i.e.: anaphylactic reaction); and/or death. Furthermore, the patient was informed of those risks and complications associated with the medications. These include, but are not limited to: allergic reactions (i.e.: anaphylactic or anaphylactoid reaction(s)); adrenal axis suppression; blood sugar elevation that in diabetics may result in ketoacidosis or comma; water retention that in patients with history of congestive heart failure may result in shortness of breath, pulmonary edema, and decompensation with resultant heart failure; weight gain; swelling or edema; medication-induced neural toxicity; particulate matter embolism and blood vessel occlusion with resultant organ, and/or nervous system infarction; and/or aseptic necrosis of one or more joints. Finally, the patient was informed that Medicine is not an exact science; therefore, there is also the possibility of  unforeseen or unpredictable risks and/or possible complications that may result in a catastrophic outcome. The patient indicated having understood very clearly. We have given the patient no guarantees and we have made no promises. Enough time was given to the patient to ask questions, all of which were answered to the patient's satisfaction. Ms. Klutts has indicated that she wanted to continue with the procedure. Attestation: I, the ordering provider, attest that I have discussed with the patient the benefits, risks, side-effects, alternatives, likelihood of achieving goals, and potential problems during recovery for the procedure that I have provided informed consent. Date  Time: 07/05/2020  8:55 AM  Pre-Procedure Preparation:  Monitoring: As per clinic protocol. Respiration, ETCO2, SpO2, BP, heart rate and rhythm monitor placed and checked for adequate function Safety Precautions: Patient was assessed for positional comfort and pressure points before starting the procedure. Time-out: I initiated and conducted the "Time-out" before starting the procedure, as per protocol. The patient was asked to participate by confirming the accuracy of the "Time Out" information. Verification of the correct person, site, and procedure were performed and confirmed by me, the nursing staff, and the patient. "Time-out" conducted as per Joint Commission's Universal Protocol (UP.01.01.01). Time: 0920  Description of Procedure:          Target Area: Knee Joint Approach: Just above the Medial tibial plateau, lateral to the infrapatellar tendon. Area Prepped: Entire knee area, from the mid-thigh to the mid-shin. DuraPrep (Iodine Povacrylex [0.7% available iodine] and Isopropyl Alcohol, 74% w/w) Safety Precautions: Aspiration looking for blood return was conducted prior to all injections. At no point did we inject any substances, as a needle was being advanced. No attempts were made at seeking any paresthesias. Safe  injection practices and needle disposal techniques used. Medications properly checked for expiration dates. SDV (single dose vial) medications used. Description of the Procedure: Protocol guidelines were followed. The patient was placed in position over the fluoroscopy table. The target area was identified and the area prepped in the usual manner. Skin & deeper tissues infiltrated with local anesthetic. Appropriate amount of time allowed to pass for local anesthetics to take effect. The procedure needles were then advanced to the target area. Proper needle placement secured. Negative aspiration confirmed. Solution injected in intermittent fashion, asking for systemic  symptoms every 0.5cc of injectate. The needles were then removed and the area cleansed, making sure to leave some of the prepping solution back to take advantage of its long term bactericidal properties. Vitals:   07/05/20 0857  BP: 113/83  Pulse: 84  Resp: 16  Temp: (!) 97 F (36.1 C)  SpO2: 95%  Weight: 134 lb (60.8 kg)  Height: 4\' 11"  (1.499 m)    Start Time: 0920 hrs. End Time: 0922 hrs. Materials:  Needle(s) Type: Regular needle Gauge: 25G Length: 1.5-in Medication(s): Please see orders for medications and dosing details. 2 cc hyalgan injected in each knee   Post-operative Assessment:  Post-procedure Vital Signs:  Pulse/HCG Rate: 84  Temp: (!) 97 F (36.1 C) Resp: 16 BP: 113/83 SpO2: 95 %  EBL: None  Complications: No immediate post-treatment complications observed by team, or reported by patient.  Note: The patient tolerated the entire procedure well. A repeat set of vitals were taken after the procedure and the patient was kept under observation following institutional policy, for this type of procedure. Post-procedural neurological assessment was performed, showing return to baseline, prior to discharge. The patient was provided with post-procedure discharge instructions, including a section on how to identify  potential problems. Should any problems arise concerning this procedure, the patient was given instructions to immediately contact , at any time, without hesitation. In any case, we plan to contact the patient by telephone for a follow-up status report regarding this interventional procedure.  Comments:  No additional relevant information.  Plan of Care   Chronic Opioid Analgesic:  Norco 7.5 mg TID PRN #90/month   Medications ordered for procedure: Meds ordered this encounter  Medications  . lidocaine (XYLOCAINE) 2 % (with pres) injection 400 mg  . Sodium Hyaluronate SOSY 2 mL  . Sodium Hyaluronate SOSY 2 mL   Medications administered: We administered lidocaine, Sodium Hyaluronate, and Sodium Hyaluronate.  See the medical record for exact dosing, route, and time of administration.  Follow-up plan:   Return for Keep sch. appt.    Recent Visits Date Type Provider Dept  05/23/20 Office Visit 07/23/20, MD [facility]  04/26/20 Procedure visit 06/24/20, MD [facility]  04/17/20 Telemedicine 04/19/20, MD [facility]  Showing recent visits within past 90 days and meeting all other requirements Today's Visits Date Type Provider Dept  07/05/20 Procedure visit 07/07/20, MD [facility]  Showing today's visits and meeting all other requirements Future Appointments Date Type Provider Dept  08/15/20 Appointment 08/17/20, MD [facility]  Showing future appointments within next 90 days and meeting all other requirements  Disposition: Discharge home  Discharge (Date  Time): 07/05/2020; 0930 hrs.   Primary Care Physician: 07/07/2020, MD Location: [facility] Note by: [facility], MD Date: 07/05/2020; Time: 9:30 AM  Disclaimer:  Medicine is not an exact science. The only guarantee in medicine is that nothing is guaranteed. It is important to note that the decision to proceed with  this intervention was based on the information collected from the patient. The Data and conclusions were drawn from the patient's questionnaire, the interview, and the physical examination. Because the information was provided in large part by the patient, it cannot be guaranteed that it has not been purposely or unconsciously manipulated. Every effort has been made to obtain as much relevant data as possible for this evaluation. It is important to note that the conclusions that lead to this procedure  are derived in large part from the available data. Always take into account that the treatment will also be dependent on availability of resources and existing treatment guidelines, considered by other Pain Management Practitioners as being common knowledge and practice, at the time of the intervention. For Medico-Legal purposes, it is also important to point out that variation in procedural techniques and pharmacological choices are the acceptable norm. The indications, contraindications, technique, and results of the above procedure should only be interpreted and judged by a Board-Certified Interventional Pain Specialist with extensive familiarity and expertise in the same exact procedure and technique.

## 2020-07-05 NOTE — Patient Instructions (Signed)

## 2020-07-06 ENCOUNTER — Telehealth: Payer: Self-pay

## 2020-07-06 NOTE — Telephone Encounter (Signed)
Post procedure phone call.  Patient states her knee is swollen and she is having trouble walking.  Patient states she had the same thing happen the last time she got her knees injected and the swelling went away.  Instructed patient to call us for any fever or any further swelling or redness.  Instructed patient to call for any questions or concerns.

## 2020-08-15 ENCOUNTER — Encounter: Payer: Self-pay | Admitting: Student in an Organized Health Care Education/Training Program

## 2020-08-15 ENCOUNTER — Other Ambulatory Visit: Payer: Self-pay

## 2020-08-15 ENCOUNTER — Ambulatory Visit
Payer: Medicaid Other | Attending: Student in an Organized Health Care Education/Training Program | Admitting: Student in an Organized Health Care Education/Training Program

## 2020-08-15 VITALS — BP 137/92 | HR 93 | Temp 97.6°F | Resp 18 | Ht 59.0 in | Wt 130.0 lb

## 2020-08-15 DIAGNOSIS — M1711 Unilateral primary osteoarthritis, right knee: Secondary | ICD-10-CM

## 2020-08-15 DIAGNOSIS — G8929 Other chronic pain: Secondary | ICD-10-CM

## 2020-08-15 DIAGNOSIS — M7918 Myalgia, other site: Secondary | ICD-10-CM

## 2020-08-15 DIAGNOSIS — M1712 Unilateral primary osteoarthritis, left knee: Secondary | ICD-10-CM | POA: Diagnosis present

## 2020-08-15 DIAGNOSIS — G894 Chronic pain syndrome: Secondary | ICD-10-CM | POA: Diagnosis present

## 2020-08-15 DIAGNOSIS — M5416 Radiculopathy, lumbar region: Secondary | ICD-10-CM | POA: Diagnosis present

## 2020-08-15 MED ORDER — HYDROCODONE-ACETAMINOPHEN 7.5-325 MG PO TABS: 1.0000 | ORAL_TABLET | Freq: Three times a day (TID) | ORAL | 0 refills | Status: AC | PRN

## 2020-08-15 MED ORDER — HYDROCODONE-ACETAMINOPHEN 7.5-325 MG PO TABS: 1.0000 | ORAL_TABLET | Freq: Three times a day (TID) | ORAL | 0 refills | Status: DC | PRN

## 2020-08-15 NOTE — Progress Notes (Signed)
PROVIDER NOTE: Information contained herein reflects review and annotations entered in association with encounter. Interpretation of such information and data should be left to medically-trained personnel. Information provided to patient can be located elsewhere in the medical record under "Patient Instructions". Document created using STT-dictation technology, any transcriptional errors that may result from process are unintentional.    Patient: [name]  Service Category: E/M  Provider: Gillis Santa, MD  DOB: Jul 25, 1958  DOS: 08/15/2020  Specialty: Interventional Pain Management  MRN: [id number]  Setting: Ambulatory outpatient  PCP: Sharyne Peach, MD  Type: Established Patient    Referring Provider: Sharyne Peach, MD  Location: Office  Delivery: Face-to-face     HPI  [name], a 60 y.o. year old female, is here today because of her Primary osteoarthritis of right knee [M17.11]. [name]'s primary complain today is Back Pain (low) Last encounter: My last encounter with her was on 07/05/2020. Pertinent problems: [name] has Lumbar radiculopathy; Chronic bilateral low back pain with bilateral sciatica; Chronic pain syndrome; Chronic, continuous use of opioids; Chronic myofascial pain; Primary osteoarthritis of both hips; Type 2 diabetes mellitus without complications ([place]); and Pain in both hands on their pertinent problem list. Pain Assessment: Severity of Chronic pain is reported as a 7 /10. Location: Back Lower/radiates into both hips and legs. Onset: More than a month ago. Quality: Stabbing,Aching. Timing: Constant. Modifying factor(s): injections, medications. Vitals:  height is 4' 11" (1.499 m) and weight is 130 lb (59 kg). Her temperature is 97.6 F (36.4 C). Her blood pressure is 137/92 (abnormal) and her pulse is 93. Her respiration is 18 and oxygen saturation is 98%.   Reason for encounter: both, medication management and post-procedure  assessment.    Post-Procedure Evaluation  Procedure (07/05/2020):  Type: Therapeutic Intra-Articular Hyalgan Knee Injection #2  Region: Medial infrapatellar Knee Region Level: Knee Joint Laterality: Bilateral  Sedation: Please see nurses note.  Effectiveness during initial hour after procedure(Ultra-Short Term Relief): 100 %   Local anesthetic used: Long-acting (4-6 hours) Effectiveness: Defined as any analgesic benefit obtained secondary to the administration of local anesthetics. This carries significant diagnostic value as to the etiological location, or anatomical origin, of the pain. Duration of benefit is expected to coincide with the duration of the local anesthetic used.  Effectiveness during initial 4-6 hours after procedure(Short-Term Relief): 100 %   Long-term benefit: Defined as any relief past the pharmacologic duration of the local anesthetics.  Effectiveness past the initial 6 hours after procedure(Long-Term Relief): 80 % (ongoing).  Current benefits: Defined as benefit that persist at this time.   Analgesia:  >75% relief  Pharmacotherapy Assessment   Analgesic: Norco 7.5 mg TID PRN #90/month   Monitoring: [place] PMP: PDMP reviewed during this encounter.       Pharmacotherapy: No side-effects or adverse reactions reported. Compliance: No problems identified. Effectiveness: Clinically acceptable.  Dewayne Shorter, RN  08/15/2020  8:38 AM  Signed Nursing Pain Medication Assessment:  Safety precautions to be maintained throughout the outpatient stay will include: orient to surroundings, keep bed in low position, maintain call bell within reach at all times, provide assistance with transfer out of bed and ambulation.  Medication Inspection Compliance: Pill count conducted under aseptic conditions, in front of the patient. Neither the pills nor the bottle was removed from the patient's sight at any time. Once count was completed pills were immediately returned to the patient in  their original bottle.  Medication: Hydrocodone/APAP Pill/Patch Count: 69 of  90 pills remain Pill/Patch Appearance: Markings consistent with prescribed medication Bottle Appearance: Standard pharmacy container. Clearly labeled. Filled Date: 05 18 / 2022 Last Medication intake:  Yesterday    UDS:  Summary  Date Value Ref Range Status  03/07/2020 Note  Final    Comment:    ==================================================================== [employer] (MW) ==================================================================== Test                             Result       Flag       Units  Drug Present and Declared for Prescription Verification   Hydrocodone                    276          EXPECTED   ng/mg creat   Hydromorphone                  159          EXPECTED   ng/mg creat   Dihydrocodeine                 180          EXPECTED   ng/mg creat   Norhydrocodone                 523          EXPECTED   ng/mg creat    Sources of hydrocodone include scheduled prescription medications.    Hydromorphone, dihydrocodeine and norhydrocodone are expected    metabolites of hydrocodone. Hydromorphone and dihydrocodeine are    also available as scheduled prescription medications.  ==================================================================== Test                      Result    Flag   Units      Ref Range   Creatinine              74               mg/dL      >=20 ==================================================================== Declared Medications:  The flagging and interpretation on this report are based on the  following declared medications.  Unexpected results may arise from  inaccuracies in the declared medications.   **Note: The testing scope of this panel includes these medications:   Hydrocodone (Norco)   **Note: The testing scope of this panel does not include the  following reported medications:   Acetaminophen (Norco)  Albuterol (Ventolin HFA)  Aspirin   Budesonide (Symbicort)  Dexlansoprazole  Famotidine (Pepcid)  Fesoterodine (Toviaz)  Fexofenadine (Allegra)  Fluticasone (Flonase)  Formoterol (Symbicort)  Insulin (Levemir)  Iron  Linaclotide (Linzess)  Losartan (Cozaar)  Magnesium  Metformin  Mirtazapine (Remeron)  Multivitamin  Ondansetron (Zofran)  Polyethylene Glycol (MiraLAX)  Pregabalin (Lyrica)  Rizatriptan (Maxalt)  Rosuvastatin (Crestor)  Solifenacin (Vesicare)  Topiramate (Topamax)  Trazodone (Desyrel)  Venlafaxine (Effexor) ==================================================================== For clinical consultation, please call [phone]. ====================================================================      ROS  Constitutional: Denies any fever or chills Gastrointestinal: No reported hemesis, hematochezia, vomiting, or acute GI distress Musculoskeletal: Denies any acute onset joint swelling, redness, loss of ROM, or weakness Neurological: No reported episodes of acute onset apraxia, aphasia, dysarthria, agnosia, amnesia, paralysis, loss of coordination, or loss of consciousness  Medication Review  ARIPiprazole, Alive Once Daily Womens, Dexlansoprazole, HYDROcodone-acetaminophen, albuterol, amLODipine, aspirin, budesonide-formoterol, cetirizine, famotidine, fesoterodine, fexofenadine, glucose blood, hydrOXYzine, insulin detemir, insulin regular, linaclotide,   losartan, metFORMIN, mirtazapine, montelukast, ondansetron, polyethylene glycol, pregabalin, propranolol, rizatriptan, rosuvastatin, solifenacin, sucralfate, tolterodine, topiramate, traZODone, triamcinolone cream, venlafaxine, and venlafaxine XR  History Review  Allergy: [name] is allergic to tramadol, benadryl [diphenhydramine hcl], bextra [valdecoxib], invokana [canagliflozin], and sulfamethoxazole-trimethoprim. Drug: [name]  reports previous drug use. Alcohol:  reports current alcohol use of about 1.0 standard drink of alcohol  per week. Tobacco:  reports that she has been smoking cigarettes. She has a 7.00 pack-year smoking history. She has quit using smokeless tobacco. Social: [name]  reports that she has been smoking cigarettes. She has a 7.00 pack-year smoking history. She has quit using smokeless tobacco. She reports current alcohol use of about 1.0 standard drink of alcohol per week. She reports previous drug use. Medical:  has a past medical history of Asthma, Asthma, BPPV (benign paroxysmal positional vertigo), Cardiac murmur, unspecified (08/28/2016), CPAP (continuous positive airway pressure) dependence, Degenerative disc disease, lumbar, Depression, Diabetes mellitus, Diverticulitis (10/2016), Fatty liver, Fibromyalgia, Fibromyalgia, GERD (gastroesophageal reflux disease), Headache, History of degenerative disc disease, Hyperlipidemia, Hypertension, and Sleep apnea. Surgical: [name]  has a past surgical history that includes Abdominal surgery; Abdominal hysterectomy; Esophagogastroduodenoscopy (egd) with propofol (N/A, 02/20/2017); Colonoscopy with propofol (N/A, 02/20/2017); Upper esophageal endoscopic ultrasound (eus) (N/A, 04/03/2017); Diverticulitis; EUS (N/A, 01/08/2018); Cholecystectomy (N/A, 04/27/2018); Cataract extraction w/PHACO (Left, 09/07/2019); and Cataract extraction w/PHACO (Right, 09/28/2019). Family: family history includes Diabetes in her mother; Hypertension in her mother.  Laboratory Chemistry Profile   Renal Lab Results  Component Value Date   BUN 15 08/09/2019   CREATININE 0.59 08/09/2019   BCR 14 11/27/2017   GFRAA >60 08/09/2019   GFRNONAA >60 08/09/2019     Hepatic Lab Results  Component Value Date   AST 25 08/09/2019   ALT 36 08/09/2019   ALBUMIN 3.9 08/09/2019   ALKPHOS 120 08/09/2019   LIPASE 19 08/09/2019     Electrolytes Lab Results  Component Value Date   NA 138 08/09/2019   K 4.2 08/09/2019   CL 108 08/09/2019   CALCIUM 8.8 (L) 08/09/2019   MG 1.8  11/27/2017     Bone No results found for: VD25OH, VD125OH2TOT, VD3125OH2, VD2125OH2, 25OHVITD1, 25OHVITD2, 25OHVITD3, TESTOFREE, TESTOSTERONE   Inflammation (CRP: Acute Phase) (ESR: Chronic Phase) Lab Results  Component Value Date   CRP <1 11/27/2017   ESRSEDRATE 17 11/27/2017   LATICACIDVEN 1.6 10/14/2016       Note: Above Lab results reviewed.  Recent Imaging Review  DG Knee 1-2 Views Right CLINICAL DATA:  Post fall, now with right knee pain.  EXAM: RIGHT KNEE - 1-2 VIEW  COMPARISON:  None.  FINDINGS: No fracture or dislocation. Severe tricompartmental degenerative change of the knee, worse with the medial compartment with near complete joint space loss, subchondral sclerosis, articular surface irregularity and osteophytosis. Small knee joint effusion. Enthesopathic change involving the superior pole the patella. No evidence of chondrocalcinosis. Regional soft tissues appear normal.  IMPRESSION: 1. Small knee joint effusion. Otherwise, no acute findings. 2. Severe tricompartmental degenerative change of the knee, worse within the medial compartment.  Electronically Signed   By: John  Watts M.D.   On: 03/29/2020 10:50 Note: Reviewed        Physical Exam  General appearance: Well nourished, well developed, and well hydrated. In no apparent acute distress Mental status: Alert, oriented x 3 (person, place, & time)       Respiratory: No evidence of acute respiratory distress Eyes: PERLA Vitals: BP (!) 137/92   Pulse 93     Temp 97.6 F (36.4 C)   Resp 18   Ht 4' 11" (1.499 m)   Wt 130 lb (59 kg)   LMP  (LMP Unknown) Comment: "many years"  SpO2 98%   BMI 26.26 kg/m  BMI: Estimated body mass index is 26.26 kg/m as calculated from the following:   Height as of this encounter: 4' 11" (1.499 m).   Weight as of this encounter: 130 lb (59 kg). Ideal: Patient must be at least 60 in tall to calculate ideal body weight  Assessment   Status Diagnosis   Controlled Controlled Controlled 1. Primary osteoarthritis of right knee   2. Arthritis of left knee   3. Lumbar radiculopathy   4. Chronic myofascial pain   5. Chronic pain syndrome      Plan of Care  [name] has a current medication list which includes the following long-term medication(s): albuterol, aripiprazole, budesonide-formoterol, budesonide-formoterol, dexlansoprazole, famotidine, fexofenadine, insulin regular, linaclotide, losartan, metformin, mirtazapine, pregabalin, propranolol, rizatriptan, topiramate, trazodone, venlafaxine, topiramate, trazodone, and venlafaxine xr.  Pharmacotherapy (Medications Ordered): Meds ordered this encounter  Medications  . HYDROcodone-acetaminophen (NORCO) 7.5-325 MG tablet    Sig: Take 1 tablet by mouth every 8 (eight) hours as needed for severe pain. Must last 30 days.    Dispense:  90 tablet    Refill:  0    Chronic Pain. (STOP Act - Not applicable). Fill one day early if closed on scheduled refill date.  . HYDROcodone-acetaminophen (NORCO) 7.5-325 MG tablet    Sig: Take 1 tablet by mouth every 8 (eight) hours as needed for severe pain. Must last 30 days.    Dispense:  90 tablet    Refill:  0    Chronic Pain. (STOP Act - Not applicable). Fill one day early if closed on scheduled refill date.  . HYDROcodone-acetaminophen (NORCO) 7.5-325 MG tablet    Sig: Take 1 tablet by mouth every 8 (eight) hours as needed for severe pain. Must last 30 days.    Dispense:  90 tablet    Refill:  0    Chronic Pain. (STOP Act - Not applicable). Fill one day early if closed on scheduled refill date.   Follow-up plan:   Return in about 16 weeks (around 12/05/2020) for Medication Management, in person.   Recent Visits Date Type Provider Dept  07/05/20 Procedure visit , , MD [facility]  05/23/20 Office Visit , , MD [facility]  Showing recent visits within past 90 days and meeting all other  requirements Today's Visits Date Type Provider Dept  08/15/20 Office Visit , , MD [facility]  Showing today's visits and meeting all other requirements Future Appointments No visits were found meeting these conditions. Showing future appointments within next 90 days and meeting all other requirements  I discussed the assessment and treatment plan with the patient. The patient was provided an opportunity to ask questions and all were answered. The patient agreed with the plan and demonstrated an understanding of the instructions.  Patient advised to call back or seek an in-person evaluation if the symptoms or condition worsens.  Duration of encounter: 30 minutes.  Note by:  , MD Date: 08/15/2020; Time: 9:12 AM 

## 2020-08-15 NOTE — Progress Notes (Signed)
Nursing Pain Medication Assessment:  Safety precautions to be maintained throughout the outpatient stay will include: orient to surroundings, keep bed in low position, maintain call bell within reach at all times, provide assistance with transfer out of bed and ambulation.  Medication Inspection Compliance: Pill count conducted under aseptic conditions, in front of the patient. Neither the pills nor the bottle was removed from the patient's sight at any time. Once count was completed pills were immediately returned to the patient in their original bottle.  Medication: Hydrocodone/APAP Pill/Patch Count: 69 of 90 pills remain Pill/Patch Appearance: Markings consistent with prescribed medication Bottle Appearance: Standard pharmacy container. Clearly labeled. Filled Date: 05 18 / 2022 Last Medication intake:  Yesterday

## 2020-09-11 ENCOUNTER — Telehealth: Payer: Self-pay

## 2020-09-11 NOTE — Telephone Encounter (Signed)
Pt. Needs prior auth to pick up medications. States that she was unable top pick them up 09/09/20

## 2020-09-14 ENCOUNTER — Encounter: Payer: Self-pay | Admitting: Student in an Organized Health Care Education/Training Program

## 2020-09-14 ENCOUNTER — Telehealth: Payer: Self-pay | Admitting: Student in an Organized Health Care Education/Training Program

## 2020-09-14 ENCOUNTER — Other Ambulatory Visit: Payer: Self-pay

## 2020-09-14 ENCOUNTER — Ambulatory Visit
Payer: Medicaid Other | Attending: Student in an Organized Health Care Education/Training Program | Admitting: Student in an Organized Health Care Education/Training Program

## 2020-09-14 DIAGNOSIS — M1711 Unilateral primary osteoarthritis, right knee: Secondary | ICD-10-CM | POA: Diagnosis not present

## 2020-09-14 DIAGNOSIS — M1712 Unilateral primary osteoarthritis, left knee: Secondary | ICD-10-CM | POA: Diagnosis not present

## 2020-09-14 NOTE — Progress Notes (Signed)
Patient: [name]  Service Category: E/M  Provider: Gillis Santa, MD  DOB: 1960-09-12  DOS: 09/14/2020  Location: Office  MRN: [id number]  Setting: Ambulatory outpatient  Referring Provider: Sharyne Peach, MD  Type: Established Patient  Specialty: Interventional Pain Management  PCP: Sharyne Peach, MD  Location: Remote location  Delivery: TeleHealth     Virtual Encounter - Pain Management PROVIDER NOTE: Information contained herein reflects review and annotations entered in association with encounter. Interpretation of such information and data should be left to medically-trained personnel. Information provided to patient can be located elsewhere in the medical record under "Patient Instructions". Document created using STT-dictation technology, any transcriptional errors that may result from process are unintentional.    Contact & Pharmacy Preferred: [phone] Home: [phone] (home) Mobile: [phone] (mobile) E-mail: No e-mail address on record  [id number] [place] [address]: [phone]: [phone]  Pre-screening  [name] offered "in-person" vs "virtual" encounter. She indicated preferring virtual for this encounter.   Reason COVID-19*  Social distancing based on CDC and AMA recommendations.   I contacted [name] on 09/14/2020 via video conference.      I clearly identified myself as BGillis Santa MD. I verified that I was speaking with the correct person using two identifiers (Name: [name] and date of birth: 710/07/1960.  Consent I sought verbal advanced consent from JSuitlandfor virtual visit interactions. I informed Ms. Ambrosius of possible security and privacy concerns, risks, and limitations associated with providing "not-in-person" medical evaluation and management services. I also informed Ms. Hiney of the availability of  "in-person" appointments. Finally, I informed her that there would be a charge for the virtual visit and that she could be  personally, fully or partially, financially responsible for it. Ms. CBregeexpressed understanding and agreed to proceed.   Historic Elements   Ms. JROSETTE BELLAVANCEis a 60y.o. year old, female patient evaluated today after our last contact on 09/14/2020. [name] has a past medical history of Asthma, Asthma, BPPV (benign paroxysmal positional vertigo), Cardiac murmur, unspecified (08/28/2016), CPAP (continuous positive airway pressure) dependence, Degenerative disc disease, lumbar, Depression, Diabetes mellitus, Diverticulitis (10/2016), Fatty liver, Fibromyalgia, Fibromyalgia, GERD (gastroesophageal reflux disease), Headache, History of degenerative disc disease, Hyperlipidemia, Hypertension, and Sleep apnea. She also  has a past surgical history that includes Abdominal surgery; Abdominal hysterectomy; Esophagogastroduodenoscopy (egd) with propofol (N/A, 02/20/2017); Colonoscopy with propofol (N/A, 02/20/2017); Upper esophageal endoscopic ultrasound (eus) (N/A, 04/03/2017); Diverticulitis; EUS (N/A, 01/08/2018); Cholecystectomy (N/A, 04/27/2018); Cataract extraction w/PHACO (Left, 09/07/2019); and Cataract extraction w/PHACO (Right, 09/28/2019). Ms. CSandinhas a current medication list which includes the following prescription(s): accu-chek guide, albuterol, amlodipine, aripiprazole, aspirin, budesonide-formoterol, budesonide-formoterol, cetirizine, dexlansoprazole, famotidine, fesoterodine, fexofenadine, hydrocodone-acetaminophen, [START ON 10/09/2020] hydrocodone-acetaminophen, [START ON 11/08/2020] hydrocodone-acetaminophen, hydroxyzine, hydroxyzine, insulin regular, levemir flextouch, linaclotide, losartan, metformin, mirtazapine, montelukast, alive once daily womens, ondansetron, polyethylene glycol, pregabalin, propranolol, rizatriptan, rosuvastatin, sucralfate,  tolterodine, topiramate, trazodone, triamcinolone cream, venlafaxine, solifenacin, topiramate, trazodone, and venlafaxine xr. She  reports that she has been smoking cigarettes. She has a 7.00 pack-year smoking history. She has quit using smokeless tobacco. She reports current alcohol use of about 1.0 standard drink of alcohol per week. She reports previous drug use. Ms. CGattis allergic to tramadol, benadryl [diphenhydramine hcl], bextra [valdecoxib], invokana [canagliflozin], and sulfamethoxazole-trimethoprim.   HPI  Today, she is being contacted for worsening of previously known (  established) problem  Worsening knee pain related to knee osteoarthritis.  Right knee worse than the left.  Is having increased pain with weightbearing.  Is requesting a repeat knee steroid injection.  We will plan for viscosupplementation with Monovisc.  Pharmacotherapy Assessment  Analgesic: Norco 7.5 mg TID PRN #90/month   Monitoring: [place] PMP: PDMP not reviewed this encounter.       Pharmacotherapy: No side-effects or adverse reactions reported. Compliance: No problems identified. Effectiveness: Clinically acceptable. Plan: Refer to "POC".  UDS:  Summary  Date Value Ref Range Status  03/07/2020 Note  Final    Comment:    ==================================================================== [employer] 13 (MW) ==================================================================== Test                             Result       Flag       Units  Drug Present and Declared for Prescription Verification   Hydrocodone                    276          EXPECTED   ng/mg creat   Hydromorphone                  159          EXPECTED   ng/mg creat   Dihydrocodeine                 180          EXPECTED   ng/mg creat   Norhydrocodone                 523          EXPECTED   ng/mg creat    Sources of hydrocodone include scheduled prescription medications.    Hydromorphone, dihydrocodeine and norhydrocodone are  expected    metabolites of hydrocodone. Hydromorphone and dihydrocodeine are    also available as scheduled prescription medications.  ==================================================================== Test                      Result    Flag   Units      Ref Range   Creatinine              74               mg/dL      >=20 ==================================================================== Declared Medications:  The flagging and interpretation on this report are based on the  following declared medications.  Unexpected results may arise from  inaccuracies in the declared medications.   **Note: The testing scope of this panel includes these medications:   Hydrocodone (Norco)   **Note: The testing scope of this panel does not include the  following reported medications:   Acetaminophen (Norco)  Albuterol (Ventolin HFA)  Aspirin  Budesonide (Symbicort)  Dexlansoprazole  Famotidine (Pepcid)  Fesoterodine (Toviaz)  Fexofenadine (Allegra)  Fluticasone (Flonase)  Formoterol (Symbicort)  Insulin (Levemir)  Iron  Linaclotide (Linzess)  Losartan (Cozaar)  Magnesium  Metformin  Mirtazapine (Remeron)  Multivitamin  Ondansetron (Zofran)  Polyethylene Glycol (MiraLAX)  Pregabalin (Lyrica)  Rizatriptan (Maxalt)  Rosuvastatin (Crestor)  Solifenacin (Vesicare)  Topiramate (Topamax)  Trazodone (Desyrel)  Venlafaxine (Effexor) ==================================================================== For clinical consultation, please call [phone]. ====================================================================     Laboratory Chemistry Profile   Renal Lab Results  Component Value Date   BUN 15 08/09/2019   CREATININE 0.59 08/09/2019  BCR 14 11/27/2017   GFRAA >60 08/09/2019   GFRNONAA >60 08/09/2019     Hepatic Lab Results  Component Value Date   AST 25 08/09/2019   ALT 36 08/09/2019   ALBUMIN 3.9 08/09/2019   ALKPHOS 120 08/09/2019   LIPASE 19 08/09/2019      Electrolytes Lab Results  Component Value Date   NA 138 08/09/2019   K 4.2 08/09/2019   CL 108 08/09/2019   CALCIUM 8.8 (L) 08/09/2019   MG 1.8 11/27/2017     Bone No results found for: VD25OH, VD125OH2TOT, JG2836OQ9, UT6546TK3, 25OHVITD1, 25OHVITD2, 25OHVITD3, TESTOFREE, TESTOSTERONE   Inflammation (CRP: Acute Phase) (ESR: Chronic Phase) Lab Results  Component Value Date   CRP <1 11/27/2017   ESRSEDRATE 17 11/27/2017   LATICACIDVEN 1.6 10/14/2016       Note: Above Lab results reviewed.  Imaging  DG Knee 1-2 Views Right CLINICAL DATA:  Post fall, now with right knee pain.  EXAM: RIGHT KNEE - 1-2 VIEW  COMPARISON:  None.  FINDINGS: No fracture or dislocation. Severe tricompartmental degenerative change of the knee, worse with the medial compartment with near complete joint space loss, subchondral sclerosis, articular surface irregularity and osteophytosis. Small knee joint effusion. Enthesopathic change involving the superior pole the patella. No evidence of chondrocalcinosis. Regional soft tissues appear normal.  IMPRESSION: 1. Small knee joint effusion. Otherwise, no acute findings. 2. Severe tricompartmental degenerative change of the knee, worse within the medial compartment.  Electronically Signed   By: Sandi Mariscal M.D.   On: 03/29/2020 10:50  Assessment  The primary encounter diagnosis was Primary osteoarthritis of right knee. A diagnosis of Arthritis of left knee was also pertinent to this visit.  Plan of Care   Ms. Gurnoor Sloop Lucy has a current medication list which includes the following long-term medication(s): albuterol, aripiprazole, budesonide-formoterol, budesonide-formoterol, dexlansoprazole, famotidine, fexofenadine, insulin regular, linaclotide, losartan, metformin, mirtazapine, pregabalin, propranolol, rizatriptan, topiramate, trazodone, venlafaxine, topiramate, trazodone, and venlafaxine xr.   Orders:  Orders Placed This Encounter   Procedures   KNEE INJECTION    Hyalgan knee injection. Please order Hyalgan.    Standing Status:   Future    Standing Expiration Date:   10/14/2020    Scheduling Instructions:     Procedure: Intra-articular visco Knee injection            Side: Bilateral     Sedation: None     Timeframe: in two (2) weeks    Order Specific Question:   Where will this procedure be performed?    Answer:   [facility]    Follow-up plan:   Return in about 2 weeks (around 09/28/2020) for B/L knee monovisc.    Recent Visits Date Type Provider Dept  08/15/20 Office Visit Gillis Santa, MD [facility]  07/05/20 Procedure visit Gillis Santa, MD [facility]  Showing recent visits within past 90 days and meeting all other requirements Today's Visits Date Type Provider Dept  09/14/20 Telemedicine Gillis Santa, MD [facility]  Showing today's visits and meeting all other requirements Future Appointments Date Type Provider Dept  12/05/20 Appointment Gillis Santa, MD [facility]  Showing future appointments within next 90 days and meeting all other requirements I discussed the assessment and treatment plan with the patient. The patient was provided an opportunity to ask questions and all were answered. The patient agreed with the plan and demonstrated an understanding of the instructions.  Patient advised to call back or seek an  in-person evaluation if the symptoms or condition worsens.  Duration of encounter: 66mnutes.  Note by: BGillis Santa MD Date: 09/14/2020; Time: 2:21 PM

## 2020-09-14 NOTE — Telephone Encounter (Signed)
Pharmacy contacted, Hydrocodone is the only med that requires PA. Patient notified that this has been submitted.

## 2020-09-14 NOTE — Telephone Encounter (Signed)
Patient states pharmacy told her they need prior auth on her meds before she can fill. Hydrocodone, Lyrica, and one other, maybe methacarbamol? Please call pharmacy and check on what she needs. Thank you

## 2020-10-09 ENCOUNTER — Ambulatory Visit: Payer: Medicaid Other | Admitting: Student in an Organized Health Care Education/Training Program

## 2020-12-05 ENCOUNTER — Other Ambulatory Visit: Payer: Self-pay

## 2020-12-05 ENCOUNTER — Ambulatory Visit
Payer: Medicaid Other | Attending: Student in an Organized Health Care Education/Training Program | Admitting: Student in an Organized Health Care Education/Training Program

## 2020-12-05 ENCOUNTER — Encounter: Payer: Self-pay | Admitting: Student in an Organized Health Care Education/Training Program

## 2020-12-05 VITALS — BP 142/97 | HR 110 | Temp 96.8°F | Resp 16 | Ht 59.0 in | Wt 124.0 lb

## 2020-12-05 DIAGNOSIS — G894 Chronic pain syndrome: Secondary | ICD-10-CM | POA: Insufficient documentation

## 2020-12-05 DIAGNOSIS — G8929 Other chronic pain: Secondary | ICD-10-CM | POA: Diagnosis present

## 2020-12-05 DIAGNOSIS — M1711 Unilateral primary osteoarthritis, right knee: Secondary | ICD-10-CM | POA: Diagnosis not present

## 2020-12-05 DIAGNOSIS — M5416 Radiculopathy, lumbar region: Secondary | ICD-10-CM | POA: Insufficient documentation

## 2020-12-05 DIAGNOSIS — M1712 Unilateral primary osteoarthritis, left knee: Secondary | ICD-10-CM | POA: Diagnosis present

## 2020-12-05 DIAGNOSIS — M7918 Myalgia, other site: Secondary | ICD-10-CM | POA: Diagnosis present

## 2020-12-05 DIAGNOSIS — M797 Fibromyalgia: Secondary | ICD-10-CM | POA: Insufficient documentation

## 2020-12-05 DIAGNOSIS — Z79899 Other long term (current) drug therapy: Secondary | ICD-10-CM | POA: Insufficient documentation

## 2020-12-05 DIAGNOSIS — F1190 Opioid use, unspecified, uncomplicated: Secondary | ICD-10-CM | POA: Insufficient documentation

## 2020-12-05 MED ORDER — PREGABALIN 100 MG PO CAPS: 100.0000 mg | ORAL_CAPSULE | Freq: Two times a day (BID) | ORAL | 2 refills | Status: DC

## 2020-12-05 MED ORDER — HYDROCODONE-ACETAMINOPHEN 7.5-325 MG PO TABS: 1.0000 | ORAL_TABLET | Freq: Three times a day (TID) | ORAL | 0 refills | Status: DC | PRN

## 2020-12-05 MED ORDER — HYDROCODONE-ACETAMINOPHEN 7.5-325 MG PO TABS: 1.0000 | ORAL_TABLET | Freq: Three times a day (TID) | ORAL | 0 refills | Status: AC | PRN

## 2020-12-05 NOTE — Progress Notes (Signed)
Nursing Pain Medication Assessment:  Safety precautions to be maintained throughout the outpatient stay will include: orient to surroundings, keep bed in low position, maintain call bell within reach at all times, provide assistance with transfer out of bed and ambulation.  Medication Inspection Compliance: Pill count conducted under aseptic conditions, in front of the patient. Neither the pills nor the bottle was removed from the patient's sight at any time. Once count was completed pills were immediately returned to the patient in their original bottle.  Medication: Hydrocodone/APAP Pill/Patch Count:  25 of 90 pills remain Pill/Patch Appearance: Markings consistent with prescribed medication Bottle Appearance: Standard pharmacy container. Clearly labeled. Filled Date: 08 / 23 / 2022 Last Medication intake:  Today Has additional tablets at home that she has placed into a separate bottle

## 2020-12-05 NOTE — Patient Instructions (Signed)

## 2020-12-05 NOTE — Progress Notes (Signed)
PROVIDER NOTE: Information contained herein reflects review and annotations entered in association with encounter. Interpretation of such information and data should be left to medically-trained personnel. Information provided to patient can be located elsewhere in the medical record under "Patient Instructions". Document created using STT-dictation technology, any transcriptional errors that may result from process are unintentional.    Patient: [name]  Service Category: E/M  Provider: Gillis Santa, MD  DOB: [birth year]  DOS: 12/05/2020  Specialty: Interventional Pain Management  MRN: [id number]  Setting: Ambulatory outpatient  PCP: Sharyne Peach, MD  Type: Established Patient    Referring Provider: Sharyne Peach, MD  Location: Office  Delivery: Face-to-face     HPI  [name], a 60 y.o. year old female, is here today because of her Primary osteoarthritis of right knee [M17.11]. [name]'s primary complain today is Back Pain (lower) Last encounter: My last encounter with her was on 09/14/2020. Pertinent problems: [name] has Lumbar radiculopathy; Chronic bilateral low back pain with bilateral sciatica; Chronic pain syndrome; Chronic, continuous use of opioids; Chronic myofascial pain; Primary osteoarthritis of both hips; Type 2 diabetes mellitus without complications ([place]); and Pain in both hands on their pertinent problem list. Pain Assessment: Severity of Chronic pain is reported as a 7 /10. Location: Back Lower/both upper legs. Onset: More than a month ago. Quality: Throbbing. Timing: Constant. Modifying factor(s): Hydrocodone. Vitals:  height is '4\' 11"'  (1.499 m) and weight is 124 lb (56.2 kg). Her temporal temperature is 96.8 F (36 C) (abnormal). Her blood pressure is 142/97 (abnormal) and her pulse is 110 (abnormal). Her respiration is 16 and oxygen saturation is 97%.   Reason for encounter:   Endorsing increased bilateral knee pain, worse with  weightbearing due to knee OA, last hyalgan injection was in April, Patient would like to repeat Otherwise, no changes in medical history. Patient requesting to restart Lyrica given increased leg and foot pain secondary to peripheral neuropathy.    Pharmacotherapy Assessment  Analgesic: Norco 7.5 mg TID PRN #90/month   Monitoring: [place] PMP: PDMP reviewed during this encounter.       Pharmacotherapy: No side-effects or adverse reactions reported. Compliance: No problems identified. Effectiveness: Clinically acceptable.  UDS:  Summary  Date Value Ref Range Status  03/07/2020 Note  Final    Comment:    ==================================================================== [facility] (MW) ==================================================================== Test                             Result       Flag       Units  Drug Present and Declared for Prescription Verification   Hydrocodone                    276          EXPECTED   ng/mg creat   Hydromorphone                  159          EXPECTED   ng/mg creat   Dihydrocodeine                 180          EXPECTED   ng/mg creat   Norhydrocodone                 523          EXPECTED   ng/mg creat  Sources of hydrocodone include scheduled prescription medications.    Hydromorphone, dihydrocodeine and norhydrocodone are expected    metabolites of hydrocodone. Hydromorphone and dihydrocodeine are    also available as scheduled prescription medications.  ==================================================================== Test                      Result    Flag   Units      Ref Range   Creatinine              74               mg/dL      >=20 ==================================================================== Declared Medications:  The flagging and interpretation on this report are based on the  following declared medications.  Unexpected results may arise from  inaccuracies in the declared medications.   **Note: The testing  scope of this panel includes these medications:   Hydrocodone (Norco)   **Note: The testing scope of this panel does not include the  following reported medications:   Acetaminophen (Norco)  Albuterol (Ventolin HFA)  Aspirin  Budesonide (Symbicort)  Dexlansoprazole  Famotidine (Pepcid)  Fesoterodine (Toviaz)  Fexofenadine (Allegra)  Fluticasone (Flonase)  Formoterol (Symbicort)  Insulin (Levemir)  Iron  Linaclotide (Linzess)  Losartan (Cozaar)  Magnesium  Metformin  Mirtazapine (Remeron)  Multivitamin  Ondansetron (Zofran)  Polyethylene Glycol (MiraLAX)  Pregabalin (Lyrica)  Rizatriptan (Maxalt)  Rosuvastatin (Crestor)  Solifenacin (Vesicare)  Topiramate (Topamax)  Trazodone (Desyrel)  Venlafaxine (Effexor) ==================================================================== For clinical consultation, please call [phone]. ====================================================================         ROS  Constitutional: Denies any fever or chills Gastrointestinal: No reported hemesis, hematochezia, vomiting, or acute GI distress Musculoskeletal:  Bilateral knee pain Neurological: No reported episodes of acute onset apraxia, aphasia, dysarthria, agnosia, amnesia, paralysis, loss of coordination, or loss of consciousness  Medication Review  ARIPiprazole, Alive Once Daily Womens, Dexlansoprazole, HYDROcodone-acetaminophen, albuterol, amLODipine, aspirin, budesonide-formoterol, cetirizine, famotidine, fexofenadine, glucose blood, hydrOXYzine, insulin detemir, insulin regular, linaclotide, losartan, metFORMIN, mirtazapine, montelukast, ondansetron, polyethylene glycol, pregabalin, propranolol, rizatriptan, rosuvastatin, solifenacin, sucralfate, tolterodine, topiramate, traZODone, triamcinolone cream, venlafaxine, and venlafaxine XR  History Review  Allergy: [name] is allergic to tramadol, benadryl [diphenhydramine hcl], bextra [valdecoxib], invokana  [canagliflozin], and sulfamethoxazole-trimethoprim. Drug: [name]  reports that she does not currently use drugs. Alcohol:  reports current alcohol use of about 1.0 standard drink per week. Tobacco:  reports that she has been smoking cigarettes. She has a 7.00 pack-year smoking history. She has quit using smokeless tobacco. Social: Ms. Livengood  reports that she has been smoking cigarettes. She has a 7.00 pack-year smoking history. She has quit using smokeless tobacco. She reports current alcohol use of about 1.0 standard drink per week. She reports that she does not currently use drugs. Medical:  has a past medical history of Asthma, Asthma, BPPV (benign paroxysmal positional vertigo), Cardiac murmur, unspecified (08/28/2016), CPAP (continuous positive airway pressure) dependence, Degenerative disc disease, lumbar, Depression, Diabetes mellitus, Diverticulitis (10/2016), Fatty liver, Fibromyalgia, Fibromyalgia, GERD (gastroesophageal reflux disease), Headache, History of degenerative disc disease, Hyperlipidemia, Hypertension, and Sleep apnea. Surgical: Ms. Lorge  has a past surgical history that includes Abdominal surgery; Abdominal hysterectomy; Esophagogastroduodenoscopy (egd) with propofol (N/A, 02/20/2017); Colonoscopy with propofol (N/A, 02/20/2017); Upper esophageal endoscopic ultrasound (eus) (N/A, 04/03/2017); Diverticulitis; EUS (N/A, 01/08/2018); Cholecystectomy (N/A, 04/27/2018); Cataract extraction w/PHACO (Left, 09/07/2019); and Cataract extraction w/PHACO (Right, 09/28/2019). Family: family history includes Diabetes in her mother; Hypertension in her mother.  Laboratory Chemistry Profile   Renal Lab  Results  Component Value Date   BUN 15 08/09/2019   CREATININE 0.59 08/09/2019   BCR 14 11/27/2017   GFRAA >60 08/09/2019   GFRNONAA >60 08/09/2019     Hepatic Lab Results  Component Value Date   AST 25 08/09/2019   ALT 36 08/09/2019   ALBUMIN 3.9 08/09/2019   ALKPHOS  120 08/09/2019   LIPASE 19 08/09/2019     Electrolytes Lab Results  Component Value Date   NA 138 08/09/2019   K 4.2 08/09/2019   CL 108 08/09/2019   CALCIUM 8.8 (L) 08/09/2019   MG 1.8 11/27/2017     Bone No results found for: VD25OH, VD125OH2TOT, RS8546EV0, JJ0093GH8, 25OHVITD1, 25OHVITD2, 25OHVITD3, TESTOFREE, TESTOSTERONE   Inflammation (CRP: Acute Phase) (ESR: Chronic Phase) Lab Results  Component Value Date   CRP <1 11/27/2017   ESRSEDRATE 17 11/27/2017   LATICACIDVEN 1.6 10/14/2016       Note: Above Lab results reviewed.  Recent Imaging Review  DG Knee 1-2 Views Right CLINICAL DATA:  Post fall, now with right knee pain.  EXAM: RIGHT KNEE - 1-2 VIEW  COMPARISON:  None.  FINDINGS: No fracture or dislocation. Severe tricompartmental degenerative change of the knee, worse with the medial compartment with near complete joint space loss, subchondral sclerosis, articular surface irregularity and osteophytosis. Small knee joint effusion. Enthesopathic change involving the superior pole the patella. No evidence of chondrocalcinosis. Regional soft tissues appear normal.  IMPRESSION: 1. Small knee joint effusion. Otherwise, no acute findings. 2. Severe tricompartmental degenerative change of the knee, worse within the medial compartment.  Electronically Signed   By: Sandi Mariscal M.D.   On: 03/29/2020 10:50 Note: Reviewed        Physical Exam  General appearance: Well nourished, well developed, and well hydrated. In no apparent acute distress Mental status: Alert, oriented x 3 (person, place, & time)       Respiratory: No evidence of acute respiratory distress Eyes: PERLA Vitals: BP (!) 142/97   Pulse (!) 110   Temp (!) 96.8 F (36 C) (Temporal)   Resp 16   Ht '4\' 11"'  (1.499 m)   Wt 124 lb (56.2 kg)   LMP  (LMP Unknown) Comment: "many years"  SpO2 97%   BMI 25.04 kg/m  BMI: Estimated body mass index is 25.04 kg/m as calculated from the following:    Height as of this encounter: '4\' 11"'  (1.499 m).   Weight as of this encounter: 124 lb (56.2 kg). Ideal: Patient must be at least 60 in tall to calculate ideal body weight  Bilateral knee pain, worse with weightbearing more pronounced on the medial aspect  Assessment   Status Diagnosis  Having a Flare-up Having a Flare-up Having a Flare-up 1. Primary osteoarthritis of right knee   2. Arthritis of left knee   3. Chronic myofascial pain   4. Chronic, continuous use of opioids   5. Chronic pain syndrome   6. Fibromyalgia   7. Pharmacologic therapy   8. Lumbar radiculopathy       Plan of Care  Ms. Yazmin Locher Onofre has a current medication list which includes the following long-term medication(s): albuterol, aripiprazole, budesonide-formoterol, budesonide-formoterol, dexlansoprazole, famotidine, fexofenadine, insulin regular, linaclotide, losartan, metformin, mirtazapine, pregabalin, propranolol, rizatriptan, topiramate, trazodone, venlafaxine, topiramate, trazodone, and venlafaxine xr.  1. Primary osteoarthritis of right knee - KNEE INJECTION; Future  2. Arthritis of left knee - KNEE INJECTION; Future  3. Chronic myofascial pain  4. Chronic, continuous use of opioids  5.  Chronic pain syndrome - HYDROcodone-acetaminophen (NORCO) 7.5-325 MG tablet; Take 1 tablet by mouth every 8 (eight) hours as needed for severe pain. Must last 30 days.  Dispense: 90 tablet; Refill: 0 - HYDROcodone-acetaminophen (NORCO) 7.5-325 MG tablet; Take 1 tablet by mouth every 8 (eight) hours as needed for severe pain. Must last 30 days.  Dispense: 90 tablet; Refill: 0 - HYDROcodone-acetaminophen (NORCO) 7.5-325 MG tablet; Take 1 tablet by mouth every 8 (eight) hours as needed for severe pain. Must last 30 days.  Dispense: 90 tablet; Refill: 0 - pregabalin (LYRICA) 100 MG capsule; Take 1 capsule (100 mg total) by mouth 2 (two) times daily.  Dispense: 60 capsule; Refill: 2 - KNEE INJECTION; Future  6.  Fibromyalgia - pregabalin (LYRICA) 100 MG capsule; Take 1 capsule (100 mg total) by mouth 2 (two) times daily.  Dispense: 60 capsule; Refill: 2  7. Pharmacologic therapy  8. Lumbar radiculopathy - HYDROcodone-acetaminophen (NORCO) 7.5-325 MG tablet; Take 1 tablet by mouth every 8 (eight) hours as needed for severe pain. Must last 30 days.  Dispense: 90 tablet; Refill: 0 - HYDROcodone-acetaminophen (NORCO) 7.5-325 MG tablet; Take 1 tablet by mouth every 8 (eight) hours as needed for severe pain. Must last 30 days.  Dispense: 90 tablet; Refill: 0 - HYDROcodone-acetaminophen (NORCO) 7.5-325 MG tablet; Take 1 tablet by mouth every 8 (eight) hours as needed for severe pain. Must last 30 days.  Dispense: 90 tablet; Refill: 0 - pregabalin (LYRICA) 100 MG capsule; Take 1 capsule (100 mg total) by mouth 2 (two) times daily.  Dispense: 60 capsule; Refill: 2   Pharmacotherapy (Medications Ordered): Meds ordered this encounter  Medications   HYDROcodone-acetaminophen (NORCO) 7.5-325 MG tablet    Sig: Take 1 tablet by mouth every 8 (eight) hours as needed for severe pain. Must last 30 days.    Dispense:  90 tablet    Refill:  0    Chronic Pain. (STOP Act - Not applicable). Fill one day early if closed on scheduled refill date.   HYDROcodone-acetaminophen (NORCO) 7.5-325 MG tablet    Sig: Take 1 tablet by mouth every 8 (eight) hours as needed for severe pain. Must last 30 days.    Dispense:  90 tablet    Refill:  0    Chronic Pain. (STOP Act - Not applicable). Fill one day early if closed on scheduled refill date.   HYDROcodone-acetaminophen (NORCO) 7.5-325 MG tablet    Sig: Take 1 tablet by mouth every 8 (eight) hours as needed for severe pain. Must last 30 days.    Dispense:  90 tablet    Refill:  0    Chronic Pain. (STOP Act - Not applicable). Fill one day early if closed on scheduled refill date.   pregabalin (LYRICA) 100 MG capsule    Sig: Take 1 capsule (100 mg total) by mouth 2 (two) times  daily.    Dispense:  60 capsule    Refill:  2    Fill one day early if pharmacy is closed on scheduled refill date. May substitute for generic if available.   Orders Placed This Encounter  Procedures   KNEE INJECTION    Indications: Knee arthralgia (pain) due to osteoarthritis (OA) Imaging: None ([id number]) Nursing: Please request pharmacy to have Monovisc (Hyaluronan) available in office.    Standing Status:   Future    Standing Expiration Date:   01/04/2021    Scheduling Instructions:     Procedure: Knee injection Monovisc (Hyaluronan)     Treatment No.:  3     Level: Intra-articular     Laterality: Bilateral Knee    Order Specific Question:   Where will this procedure be performed?    Answer:   [facility]     Follow-up plan:   Return in about 6 days (around 12/11/2020) for Knee monovisc , without sedation.   Recent Visits Date Type Provider Dept  09/14/20 Telemedicine Gillis Santa, MD [facility]  Showing recent visits within past 90 days and meeting all other requirements Today's Visits Date Type Provider Dept  12/05/20 Office Visit Gillis Santa, MD [facility]  Showing today's visits and meeting all other requirements Future Appointments No visits were found meeting these conditions. Showing future appointments within next 90 days and meeting all other requirements I discussed the assessment and treatment plan with the patient. The patient was provided an opportunity to ask questions and all were answered. The patient agreed with the plan and demonstrated an understanding of the instructions.  Patient advised to call back or seek an in-person evaluation if the symptoms or condition worsens.  Duration of encounter: 30 minutes.  Note by: Gillis Santa, MD Date: 12/05/2020; Time: 9:05 AM

## 2020-12-11 ENCOUNTER — Encounter: Payer: Self-pay | Admitting: Student in an Organized Health Care Education/Training Program

## 2020-12-11 ENCOUNTER — Other Ambulatory Visit: Payer: Self-pay

## 2020-12-11 ENCOUNTER — Ambulatory Visit
Payer: Medicaid Other | Attending: Student in an Organized Health Care Education/Training Program | Admitting: Student in an Organized Health Care Education/Training Program

## 2020-12-11 VITALS — BP 137/99 | HR 98 | Temp 97.0°F | Resp 16 | Ht 59.0 in | Wt 124.0 lb

## 2020-12-11 DIAGNOSIS — M1712 Unilateral primary osteoarthritis, left knee: Secondary | ICD-10-CM | POA: Diagnosis not present

## 2020-12-11 DIAGNOSIS — G894 Chronic pain syndrome: Secondary | ICD-10-CM | POA: Diagnosis not present

## 2020-12-11 DIAGNOSIS — M1711 Unilateral primary osteoarthritis, right knee: Secondary | ICD-10-CM | POA: Insufficient documentation

## 2020-12-11 MED ORDER — HYALURONAN 88 MG/4ML IX SOSY
4.0000 mL | PREFILLED_SYRINGE | Freq: Once | INTRA_ARTICULAR | Status: AC
  Administered 2020-12-11: 88 mg via INTRA_ARTICULAR
  Filled 2020-12-11: qty 4

## 2020-12-11 MED ORDER — LIDOCAINE HCL (PF) 2 % IJ SOLN
INTRAMUSCULAR | Status: AC
  Filled 2020-12-11: qty 5

## 2020-12-11 MED ORDER — LIDOCAINE HCL 2 % IJ SOLN
20.0000 mL | Freq: Once | INTRAMUSCULAR | Status: AC
  Administered 2020-12-11: 100 mg
  Filled 2020-12-11: qty 20

## 2020-12-11 NOTE — Progress Notes (Signed)
Safety precautions to be maintained throughout the outpatient stay will include: orient to surroundings, keep bed in low position, maintain call bell within reach at all times, provide assistance with transfer out of bed and ambulation.  

## 2020-12-11 NOTE — Patient Instructions (Signed)
Virtual visit with Dr. Cherylann Ratel in 4-6 weeks.

## 2020-12-11 NOTE — Progress Notes (Signed)
PROVIDER NOTE: Information contained herein reflects review and annotations entered in association with encounter. Interpretation of such information and data should be left to medically-trained personnel. Information provided to patient can be located elsewhere in the medical record under "Patient Instructions". Document created using STT-dictation technology, any transcriptional errors that may result from process are unintentional.    Patient: [name]  Service Category: Procedure  Provider: Edward Jolly, MD  DOB: September 28, 1960  DOS: 12/11/2020  Location: [facility]  MRN: [id number]  Setting: Ambulatory - outpatient  Referring Provider: Rayetta Humphrey, MD  Type: Established Patient  Specialty: Interventional Pain Management  PCP: Rayetta Humphrey, MD   Primary Reason for Visit: Interventional Pain Management Treatment. CC:  Right and left knee pain  Procedure:          Anesthesia, Analgesia, Anxiolysis:  Type: Therapeutic Intra-Articular  MONOVISC  Knee Injection #3  Region: Medial infrapatellar Knee Region Level: Knee Joint Laterality: Bilateral  Type: Local Anesthesia Indication(s): Analgesia         Local Anesthetic: Lidocaine 1-2% Route: Infiltration ([place]/IM) IV Access: Declined Sedation: Declined   Position: Sitting   Indications: 1. Primary osteoarthritis of right knee   2. Arthritis of left knee   3. Chronic pain syndrome    Pain Score: Pre-procedure: 7 /10 Post-procedure: 7 /10   Pre-op H&P Assessment:  [name] is a 60 y.o. (year old), female patient, seen today for interventional treatment. She  has a past surgical history that includes Abdominal surgery; Abdominal hysterectomy; Esophagogastroduodenoscopy (egd) with propofol (N/A, 02/20/2017); Colonoscopy with propofol (N/A, 02/20/2017); Upper esophageal endoscopic ultrasound (eus) (N/A, 04/03/2017); Diverticulitis; EUS (N/A, 01/08/2018); Cholecystectomy (N/A, 04/27/2018); Cataract  extraction w/PHACO (Left, 09/07/2019); and Cataract extraction w/PHACO (Right, 09/28/2019). [name] has a current medication list which includes the following prescription(s): accu-chek guide, albuterol, amlodipine, aripiprazole, aspirin, budesonide-formoterol, budesonide-formoterol, cetirizine, dexlansoprazole, famotidine, fexofenadine, [START ON 12/14/2020] hydrocodone-acetaminophen, [START ON 01/13/2021] hydrocodone-acetaminophen, [START ON 02/12/2021] hydrocodone-acetaminophen, hydroxyzine, hydroxyzine, insulin regular, levemir flextouch, linaclotide, losartan, metformin, mirtazapine, montelukast, ondansetron, polyethylene glycol, pregabalin, propranolol, rizatriptan, rosuvastatin, solifenacin, sucralfate, tolterodine, topiramate, trazodone, triamcinolone cream, venlafaxine, alive once daily womens, topiramate, trazodone, and venlafaxine xr. Her primarily concern today is the Knee Pain (right)  Initial Vital Signs:  Pulse/HCG Rate: 98  Temp: (!) 97 F (36.1 C) Resp: 16 BP: (!) 137/100 SpO2: 95 %  BMI: Estimated body mass index is 25.04 kg/m as calculated from the following:   Height as of this encounter: 4\' 11"  (1.499 m).   Weight as of this encounter: 124 lb (56.2 kg).  Risk Assessment: Allergies: Reviewed. She is allergic to tramadol, benadryl [diphenhydramine hcl], bextra [valdecoxib], invokana [canagliflozin], and sulfamethoxazole-trimethoprim.  Allergy Precautions: None required Coagulopathies: Reviewed. None identified.  Blood-thinner therapy: None at this time Active Infection(s): Reviewed. None identified. [name] is afebrile  Site Confirmation: [name] was asked to confirm the procedure and laterality before marking the site Procedure checklist: Completed Consent: Before the procedure and under the influence of no sedative(s), amnesic(s), or anxiolytics, the patient was informed of the treatment options, risks and possible complications. To fulfill our ethical  and legal obligations, as recommended by the American Medical Association's Code of Ethics, I have informed the patient of my clinical impression; the nature and purpose of the treatment or procedure; the risks, benefits, and possible complications of the intervention; the alternatives, including doing nothing; the risk(s) and benefit(s) of the alternative treatment(s) or procedure(s); and the risk(s) and benefit(s) of doing nothing. The patient was  provided information about the general risks and possible complications associated with the procedure. These may include, but are not limited to: failure to achieve desired goals, infection, bleeding, organ or nerve damage, allergic reactions, paralysis, and death. In addition, the patient was informed of those risks and complications associated to the procedure, such as failure to decrease pain; infection; bleeding; organ or nerve damage with subsequent damage to sensory, motor, and/or autonomic systems, resulting in permanent pain, numbness, and/or weakness of one or several areas of the body; allergic reactions; (i.e.: anaphylactic reaction); and/or death. Furthermore, the patient was informed of those risks and complications associated with the medications. These include, but are not limited to: allergic reactions (i.e.: anaphylactic or anaphylactoid reaction(s)); adrenal axis suppression; blood sugar elevation that in diabetics may result in ketoacidosis or comma; water retention that in patients with history of congestive heart failure may result in shortness of breath, pulmonary edema, and decompensation with resultant heart failure; weight gain; swelling or edema; medication-induced neural toxicity; particulate matter embolism and blood vessel occlusion with resultant organ, and/or nervous system infarction; and/or aseptic necrosis of one or more joints. Finally, the patient was informed that Medicine is not an exact science; therefore, there is also the  possibility of unforeseen or unpredictable risks and/or possible complications that may result in a catastrophic outcome. The patient indicated having understood very clearly. We have given the patient no guarantees and we have made no promises. Enough time was given to the patient to ask questions, all of which were answered to the patient's satisfaction. [name] has indicated that she wanted to continue with the procedure. Attestation: I, the ordering provider, attest that I have discussed with the patient the benefits, risks, side-effects, alternatives, likelihood of achieving goals, and potential problems during recovery for the procedure that I have provided informed consent. Date  Time: 12/11/2020 10:42 AM  Pre-Procedure Preparation:  Monitoring: As per clinic protocol. Respiration, ETCO2, SpO2, BP, heart rate and rhythm monitor placed and checked for adequate function Safety Precautions: Patient was assessed for positional comfort and pressure points before starting the procedure. Time-out: I initiated and conducted the "Time-out" before starting the procedure, as per protocol. The patient was asked to participate by confirming the accuracy of the "Time Out" information. Verification of the correct person, site, and procedure were performed and confirmed by me, the nursing staff, and the patient. "Time-out" conducted as per Joint Commission's Universal Protocol (UP.01.01.01). Time: 1116  Description of Procedure:          Target Area: Knee Joint Approach: Just above the Medial tibial plateau, lateral to the infrapatellar tendon. Area Prepped: Entire knee area, from the mid-thigh to the mid-shin. DuraPrep (Iodine Povacrylex [0.7% available iodine] and Isopropyl Alcohol, 74% w/w) Safety Precautions: Aspiration looking for blood return was conducted prior to all injections. At no point did we inject any substances, as a needle was being advanced. No attempts were made at seeking any  paresthesias. Safe injection practices and needle disposal techniques used. Medications properly checked for expiration dates. SDV (single dose vial) medications used. Description of the Procedure: Protocol guidelines were followed. The patient was placed in position over the fluoroscopy table. The target area was identified and the area prepped in the usual manner. Skin & deeper tissues infiltrated with local anesthetic. Appropriate amount of time allowed to pass for local anesthetics to take effect. The procedure needles were then advanced to the target area. Proper needle placement secured. Negative aspiration confirmed. Solution injected in intermittent fashion,  asking for systemic symptoms every 0.5cc of injectate. The needles were then removed and the area cleansed, making sure to leave some of the prepping solution back to take advantage of its long term bactericidal properties. Vitals:   12/11/20 1051 12/11/20 1100  BP: (!) 137/100 (!) 137/99  Pulse: 98 98  Resp: 16 16  Temp: (!) 97 F (36.1 C) (!) 97 F (36.1 C)  TempSrc: Temporal Temporal  SpO2: 95%   Weight: 124 lb (56.2 kg)   Height: 4\' 11"  (1.499 m)     Start Time: 1117 hrs. End Time: 1119 hrs. Materials:  Needle(s) Type: Regular needle Gauge: 25G Length: 1.5-in Medication(s): Please see orders for medications and dosing details. 4cc MONOVISC injected in each knee   Post-operative Assessment:  Post-procedure Vital Signs:  Pulse/HCG Rate: 98  Temp: (!) 97 F (36.1 C) Resp: 16 BP: (!) 137/99 SpO2: 95 %  EBL: None  Complications: No immediate post-treatment complications observed by team, or reported by patient.  Note: The patient tolerated the entire procedure well. A repeat set of vitals were taken after the procedure and the patient was kept under observation following institutional policy, for this type of procedure. Post-procedural neurological assessment was performed, showing return to baseline, prior to  discharge. The patient was provided with post-procedure discharge instructions, including a section on how to identify potential problems. Should any problems arise concerning this procedure, the patient was given instructions to immediately contact , at any time, without hesitation. In any case, we plan to contact the patient by telephone for a follow-up status report regarding this interventional procedure.  Comments:  No additional relevant information.  Plan of Care   Chronic Opioid Analgesic:  Norco 7.5 mg TID PRN #90/month   Medications ordered for procedure: Meds ordered this encounter  Medications   Hyaluronan SOSY 88 mg   lidocaine (XYLOCAINE) 2 % (with pres) injection 400 mg   Hyaluronan SOSY 88 mg   Medications administered: We administered Hyaluronan, lidocaine, and Hyaluronan.  See the medical record for exact dosing, route, and time of administration.  Follow-up plan:   Return in about 5 weeks (around 01/15/2021).    Recent Visits Date Type Provider Dept  12/05/20 Office Visit 12/07/20, MD [facility]  09/14/20 Telemedicine 09/16/20, MD [facility]  Showing recent visits within past 90 days and meeting all other requirements Today's Visits Date Type Provider Dept  12/11/20 Procedure visit 12/13/20, MD [facility]  Showing today's visits and meeting all other requirements Future Appointments Date Type Provider Dept  01/15/21 Appointment 01/17/21, MD [facility]  03/06/21 Appointment 03/08/21, MD [facility]  Showing future appointments within next 90 days and meeting all other requirements Disposition: Discharge home  Discharge (Date  Time): 12/11/2020; 1121 hrs.   Primary Care Physician: 12/13/2020, MD Location: [facility] Note by: [facility], MD Date: 12/11/2020; Time: 1:17 PM  Disclaimer:  Medicine is not an 12/13/2020. The only guarantee in  medicine is that nothing is guaranteed. It is important to note that the decision to proceed with this intervention was based on the information collected from the patient. The Data and conclusions were drawn from the patient's questionnaire, the interview, and the physical examination. Because the information was provided in large part by the patient, it cannot be guaranteed that it has not been purposely or unconsciously manipulated. Every effort has been made to obtain as much relevant data  as possible for this evaluation. It is important to note that the conclusions that lead to this procedure are derived in large part from the available data. Always take into account that the treatment will also be dependent on availability of resources and existing treatment guidelines, considered by other Pain Management Practitioners as being common knowledge and practice, at the time of the intervention. For Medico-Legal purposes, it is also important to point out that variation in procedural techniques and pharmacological choices are the acceptable norm. The indications, contraindications, technique, and results of the above procedure should only be interpreted and judged by a Board-Certified Interventional Pain Specialist with extensive familiarity and expertise in the same exact procedure and technique.

## 2020-12-12 ENCOUNTER — Telehealth: Payer: Self-pay | Admitting: Student in an Organized Health Care Education/Training Program

## 2020-12-12 ENCOUNTER — Telehealth: Payer: Self-pay

## 2020-12-12 NOTE — Telephone Encounter (Signed)
Post procedure phone call.  LM 

## 2020-12-25 ENCOUNTER — Emergency Department: Payer: Medicaid Other

## 2020-12-25 ENCOUNTER — Emergency Department
Admission: EM | Admit: 2020-12-25 | Discharge: 2020-12-25 | Disposition: A | Discharge status: Home / Self Care | Payer: Medicaid Other | Attending: Emergency Medicine | Admitting: Emergency Medicine

## 2020-12-25 ENCOUNTER — Other Ambulatory Visit: Payer: Self-pay

## 2020-12-25 DIAGNOSIS — Z7982 Long term (current) use of aspirin: Secondary | ICD-10-CM | POA: Insufficient documentation

## 2020-12-25 DIAGNOSIS — R1084 Generalized abdominal pain: Secondary | ICD-10-CM

## 2020-12-25 DIAGNOSIS — Z794 Long term (current) use of insulin: Secondary | ICD-10-CM | POA: Diagnosis not present

## 2020-12-25 DIAGNOSIS — K219 Gastro-esophageal reflux disease without esophagitis: Secondary | ICD-10-CM | POA: Diagnosis not present

## 2020-12-25 DIAGNOSIS — Z7951 Long term (current) use of inhaled steroids: Secondary | ICD-10-CM | POA: Insufficient documentation

## 2020-12-25 DIAGNOSIS — Z7984 Long term (current) use of oral hypoglycemic drugs: Secondary | ICD-10-CM | POA: Diagnosis not present

## 2020-12-25 DIAGNOSIS — K580 Irritable bowel syndrome with diarrhea: Secondary | ICD-10-CM

## 2020-12-25 DIAGNOSIS — I10 Essential (primary) hypertension: Secondary | ICD-10-CM | POA: Diagnosis not present

## 2020-12-25 DIAGNOSIS — F17210 Nicotine dependence, cigarettes, uncomplicated: Secondary | ICD-10-CM | POA: Insufficient documentation

## 2020-12-25 DIAGNOSIS — R109 Unspecified abdominal pain: Secondary | ICD-10-CM | POA: Diagnosis present

## 2020-12-25 DIAGNOSIS — J45909 Unspecified asthma, uncomplicated: Secondary | ICD-10-CM | POA: Insufficient documentation

## 2020-12-25 DIAGNOSIS — J449 Chronic obstructive pulmonary disease, unspecified: Secondary | ICD-10-CM | POA: Insufficient documentation

## 2020-12-25 DIAGNOSIS — E119 Type 2 diabetes mellitus without complications: Secondary | ICD-10-CM | POA: Insufficient documentation

## 2020-12-25 DIAGNOSIS — Z79899 Other long term (current) drug therapy: Secondary | ICD-10-CM | POA: Insufficient documentation

## 2020-12-25 LAB — URINALYSIS, COMPLETE (UACMP) WITH MICROSCOPIC
Bilirubin Urine: NEGATIVE
Glucose, UA: >=500 mg/dL — AB
Hgb urine dipstick: NEGATIVE
Ketones, ur: NEGATIVE mg/dL
Leukocytes,Ua: NEGATIVE
Nitrite: NEGATIVE
Protein, ur: 100 mg/dL — AB
Specific Gravity, Urine: 1.03 (ref 1.005–1.030)
pH: 5 (ref 5.0–8.0)

## 2020-12-25 LAB — COMPREHENSIVE METABOLIC PANEL WITH GFR
ALT: 66 U/L — ABNORMAL HIGH (ref 0–44)
AST: 55 U/L — ABNORMAL HIGH (ref 15–41)
Albumin: 4 g/dL (ref 3.5–5.0)
Alkaline Phosphatase: 130 U/L — ABNORMAL HIGH (ref 38–126)
Anion gap: 8 (ref 5–15)
BUN: 9 mg/dL (ref 6–20)
CO2: 22 mmol/L (ref 22–32)
Calcium: 9.1 mg/dL (ref 8.9–10.3)
Chloride: 104 mmol/L (ref 98–111)
Creatinine, Ser: 0.73 mg/dL (ref 0.44–1.00)
GFR, Estimated: >60 mL/min
Glucose, Bld: 412 mg/dL — ABNORMAL HIGH (ref 70–99)
Potassium: 4.3 mmol/L (ref 3.5–5.1)
Sodium: 134 mmol/L — ABNORMAL LOW (ref 135–145)
Total Bilirubin: 0.8 mg/dL (ref 0.3–1.2)
Total Protein: 7 g/dL (ref 6.5–8.1)

## 2020-12-25 LAB — CBC
HCT: 48 % — ABNORMAL HIGH (ref 36.0–46.0)
Hemoglobin: 16.1 g/dL — ABNORMAL HIGH (ref 12.0–15.0)
MCH: 29.5 pg (ref 26.0–34.0)
MCHC: 33.5 g/dL (ref 30.0–36.0)
MCV: 88.1 fL (ref 80.0–100.0)
Platelets: 233 10*3/uL (ref 150–400)
RBC: 5.45 MIL/uL — ABNORMAL HIGH (ref 3.87–5.11)
RDW: 13.5 % (ref 11.5–15.5)
WBC: 10.7 10*3/uL — ABNORMAL HIGH (ref 4.0–10.5)
nRBC: 0 % (ref 0.0–0.2)

## 2020-12-25 LAB — CBG MONITORING, ED: Glucose-Capillary: 237 mg/dL — ABNORMAL HIGH (ref 70–99)

## 2020-12-25 LAB — LIPASE, BLOOD: Lipase: 40 U/L (ref 11–51)

## 2020-12-25 MED ORDER — SODIUM CHLORIDE 0.9 % IV BOLUS
1000.0000 mL | Freq: Once | INTRAVENOUS | Status: AC
  Administered 2020-12-25: 1000 mL via INTRAVENOUS

## 2020-12-25 MED ORDER — IOHEXOL 350 MG/ML SOLN
80.0000 mL | Freq: Once | INTRAVENOUS | Status: AC | PRN
  Administered 2020-12-25: 80 mL via INTRAVENOUS
  Filled 2020-12-25: qty 80

## 2020-12-25 MED ORDER — LOPERAMIDE HCL 2 MG PO TABS: 2.0000 mg | ORAL_TABLET | Freq: Four times a day (QID) | ORAL | 0 refills | Status: DC | PRN

## 2020-12-25 MED ORDER — DICYCLOMINE HCL 10 MG PO CAPS: 10.0000 mg | ORAL_CAPSULE | Freq: Four times a day (QID) | ORAL | 1 refills | Status: DC

## 2020-12-25 MED ORDER — INSULIN ASPART 100 UNIT/ML IJ SOLN
10.0000 [IU] | Freq: Once | INTRAMUSCULAR | Status: AC
  Administered 2020-12-25: 10 [IU] via INTRAVENOUS
  Filled 2020-12-25: qty 1

## 2020-12-25 MED ORDER — DICYCLOMINE HCL 10 MG PO CAPS
10.0000 mg | ORAL_CAPSULE | Freq: Once | ORAL | Status: AC
  Administered 2020-12-25: 10 mg via ORAL
  Filled 2020-12-25: qty 1

## 2020-12-25 NOTE — ED Provider Notes (Signed)
Emergency Medicine Provider Triage Evaluation Note  [name], a 60 y.o. female  was evaluated in triage.  Pt complains of lower abdominal pain with some radiation to the left flank last 3 weeks.  Patient also reports some nausea and some diarrhea she denies any frank hematochezia, but does note dark-colored stools that she notices "chocolate brown."  She denies any fevers, chills, sweats.  She takes Linzess for her diagnosis of IBS.  Review of Systems  Positive: Lower abd pain, diarrhea, left flank pain  Negative: FCS  Physical Exam  BP 135/84 (BP Location: Left Arm)   Pulse 80   Temp 98.3 F (36.8 C) (Oral)   Resp 18   Ht 4\' 11"  (1.499 m)   Wt 56 kg   LMP  (LMP Unknown) Comment: "many years"  SpO2 95%   BMI 24.94 kg/m  Gen:   Awake, no distress  NAD Resp:  Normal effort CTA MSK:   Moves extremities without difficulty  Other:  ABD: soft, nontender  Medical Decision Making  Medically screening exam initiated at 2:15 PM.  Appropriate orders placed.  Marlynn Hinckley Prettyman was informed that the remainder of the evaluation will be completed by another provider, this initial triage assessment does not replace that evaluation, and the importance of remaining in the ED until their evaluation is complete.   Patient ED evaluation lower abdominal pain, left flank pain, intermittent diarrhea for the last 3 weeks.   Velia Meyer, PA-C 12/25/20 1416    02/24/21, MD 12/25/20 [phone]

## 2020-12-25 NOTE — ED Provider Notes (Signed)
[facility] Emergency Department Provider Note  ____________________________________________  Time seen: Approximately 4:06 PM  I have reviewed the triage vital signs and the nursing notes.   HISTORY  Chief Complaint Abdominal Pain and Flank Pain    HPI [name] is a 60 y.o. female who presents the emergency department complaining of 3 weeks of loose stools, nausea, flank pain and abdominal pain.  She states that her stools have been darker than normal but no frank blood.  Patient is an IBS patient and is supposed to take Linzess for her IBS.  She is been taking these meds but states that she has not been taking her insulin for her diabetes.  Patient has had some nausea but no reported emesis.  No fevers, chills.  Patient has had some burning with urination but no hematuria.  Again symptoms have been ongoing x3 weeks.  She has an upcoming appointment with her GI specialist but states that the pain was getting worse to the point that she could not wait.  History of asthma, COPD, degenerative disc disease, diabetes, GERD, fibromyalgia, hypertension, sleep apnea.       Past Medical History:  Diagnosis Date   Asthma    Asthma    BPPV (benign paroxysmal positional vertigo)    none recently   Cardiac murmur, unspecified 08/28/2016   CPAP (continuous positive airway pressure) dependence    Degenerative disc disease, lumbar    Depression    Diabetes mellitus    Diverticulitis 10/2016   Fatty liver    Fibromyalgia    Fibromyalgia    GERD (gastroesophageal reflux disease)    Headache    migraines.  None since starting topamax   History of degenerative disc disease    Hyperlipidemia    Hypertension    Sleep apnea    CPAP    Patient Active Problem List   Diagnosis Date Noted   Arthritis of left knee 07/05/2020   OSA on CPAP 05/15/2020   CPAP use counseling 05/15/2020   Primary osteoarthritis of right knee 04/26/2020   Anxiety 08/29/2019   OSA  (obstructive sleep apnea) 09/05/2018   Pain in both hands 03/11/2018   Calculus of gallbladder without cholecystitis without obstruction 03/04/2018   Chronic constipation 03/04/2018   Pelvic pain in female 03/04/2018   Chronic constipation 03/04/2018   Pharmacologic therapy 11/27/2017   Disorder of skeletal system 11/27/2017   Problems influencing health status 11/27/2017   Aortic atherosclerosis (HCC) 11/26/2017   Smoker 09/01/2017   Lumbar radiculopathy 05/29/2017   Chronic bilateral low back pain with bilateral sciatica 05/29/2017   Chronic pain syndrome 05/29/2017   Chronic, continuous use of opioids 05/29/2017   Chronic myofascial pain 05/29/2017   Primary osteoarthritis of both hips 03/11/2017   Fatty liver disease, nonalcoholic 10/25/2016   DDD (degenerative disc disease), lumbar 10/23/2016   Diverticulitis 10/14/2016   Cardiac murmur 08/28/2016   Hyperlipidemia, mixed 05/29/2016   Type 2 diabetes mellitus without complications (HCC) 05/29/2016   COPD (chronic obstructive pulmonary disease) (HCC) 01/02/2016   Essential hypertension 05/15/2015    Past Surgical History:  Procedure Laterality Date   ABDOMINAL HYSTERECTOMY     ABDOMINAL SURGERY     CATARACT EXTRACTION W/PHACO Left 09/07/2019   Procedure: CATARACT EXTRACTION PHACO AND INTRAOCULAR LENS PLACEMENT (IOC) LEFT DIABETIC;  Surgeon: Galen Manila, MD;  Location: [facility];  Service: Ophthalmology;  Laterality: Left;  4.01 0:43.8   CATARACT EXTRACTION W/PHACO Right 09/28/2019   Procedure: CATARACT EXTRACTION PHACO  AND INTRAOCULAR LENS PLACEMENT (IOC) RIGHT DIABETIC 4.16  00:43.3;  Surgeon: Galen Manila, MD;  Location: [facility];  Service: Ophthalmology;  Laterality: Right;  Diabetic - insulin and oral meds   CHOLECYSTECTOMY N/A 04/27/2018   Procedure: LAPAROSCOPIC CHOLECYSTECTOMY;  Surgeon: Carolan Shiver, MD;  Location: [facility];  Service: General;  Laterality: N/A;   COLONOSCOPY WITH  PROPOFOL N/A 02/20/2017   Procedure: COLONOSCOPY WITH PROPOFOL;  Surgeon: Christena Deem, MD;  Location: [facility];  Service: Endoscopy;  Laterality: N/A;   Diverticulitis     ESOPHAGOGASTRODUODENOSCOPY (EGD) WITH PROPOFOL N/A 02/20/2017   Procedure: ESOPHAGOGASTRODUODENOSCOPY (EGD) WITH PROPOFOL;  Surgeon: Christena Deem, MD;  Location: [facility] ENDOSCOPY;  Service: Endoscopy;  Laterality: N/A;   EUS N/A 01/08/2018   Procedure: UPPER ENDOSCOPIC ULTRASOUND (EUS) RADIAL;  Surgeon: Bearl Mulberry, MD;  Location: [facility];  Service: Gastroenterology;  Laterality: N/A;   UPPER ESOPHAGEAL ENDOSCOPIC ULTRASOUND (EUS) N/A 04/03/2017   Procedure: UPPER ESOPHAGEAL ENDOSCOPIC ULTRASOUND (EUS);  Surgeon: Bearl Mulberry, MD;  Location: [facility] ENDOSCOPY;  Service: Gastroenterology;  Laterality: N/A;    Prior to Admission medications   Medication Sig Start Date End Date Taking? Authorizing Provider  dicyclomine (BENTYL) 10 MG capsule Take 1 capsule (10 mg total) by mouth 4 (four) times daily for 14 days. 12/25/20 01/08/21 Yes Celestial Barnfield, Delorise Royals, PA-C  loperamide (IMODIUM A-D) 2 MG tablet Take 1 tablet (2 mg total) by mouth 4 (four) times daily as needed for diarrhea or loose stools. 12/25/20  Yes Nobuko Gsell, Delorise Royals, PA-C  ACCU-CHEK GUIDE test strip USE TID 12/30/17   [provider]  albuterol (PROVENTIL HFA;VENTOLIN HFA) 108 (90 Base) MCG/ACT inhaler Inhale 1 puff into the lungs 2 (two) times daily as needed.    [provider]  amLODipine (NORVASC) 10 MG tablet Take 1 tablet by mouth daily. 03/30/20   [provider]  ARIPiprazole (ABILIFY) 5 MG tablet Take 5 mg by mouth daily.    [provider]  aspirin 81 MG tablet Take 81 mg by mouth daily.     [provider]  budesonide-formoterol (SYMBICORT) 160-4.5 MCG/ACT inhaler Inhale 2 puffs into the lungs 2 (two) times daily.    [provider]  budesonide-formoterol (SYMBICORT)  160-4.5 MCG/ACT inhaler Inhale 2 puffs into the lungs 2 (two) times daily. 05/05/20   [provider]  cetirizine (ZYRTEC) 10 MG tablet Take by mouth. 04/28/20   [provider]  Dexlansoprazole 30 MG capsule Take 30 mg by mouth daily.    [provider]  famotidine (PEPCID) 40 MG tablet Take 40 mg by mouth at bedtime.    [provider]  fexofenadine (ALLEGRA) 180 MG tablet Take 180 mg by mouth daily. 08/29/18   [provider]  HYDROcodone-acetaminophen (NORCO) 7.5-325 MG tablet Take 1 tablet by mouth every 8 (eight) hours as needed for severe pain. Must last 30 days. 12/14/20 01/13/21  Edward Jolly, MD  HYDROcodone-acetaminophen (NORCO) 7.5-325 MG tablet Take 1 tablet by mouth every 8 (eight) hours as needed for severe pain. Must last 30 days. 01/13/21 02/12/21  Edward Jolly, MD  HYDROcodone-acetaminophen (NORCO) 7.5-325 MG tablet Take 1 tablet by mouth every 8 (eight) hours as needed for severe pain. Must last 30 days. 02/12/21 03/14/21  Edward Jolly, MD  hydrOXYzine (ATARAX/VISTARIL) 50 MG tablet Take 50 mg by mouth 2 (two) times daily. 05/03/20   [provider]  hydrOXYzine (ATARAX/VISTARIL) 50 MG tablet Take 50 mg by mouth in the morning and  at bedtime.    [provider]  insulin regular (NOVOLIN R) 100 units/mL injection Inject 12 Units into the skin 3 (three) times daily before meals.    [provider]  LEVEMIR FLEXTOUCH 100 UNIT/ML Pen Inject 60 Units into the skin daily.  01/07/18   [provider]  linaclotide (LINZESS) 290 MCG CAPS capsule Take 290 mcg by mouth daily before breakfast.    [provider]  losartan (COZAAR) 100 MG tablet Take 100 mg by mouth daily. 09/10/18   [provider]  metFORMIN (GLUCOPHAGE) 1000 MG tablet Take 1,000 mg by mouth 2 (two) times daily.  06/07/16   [provider]  mirtazapine (REMERON) 45 MG tablet Take 45 mg by mouth daily. 04/14/18   [provider]  montelukast (SINGULAIR) 10 MG tablet Take 1 tablet by mouth daily. 09/14/19   [provider]  Multiple Vitamins-Minerals (ALIVE ONCE DAILY WOMENS) TABS Take 1 tablet by mouth daily.  Patient not taking: No sig reported    [provider]  ondansetron (ZOFRAN) 4 MG tablet Take 4 mg by mouth every 8 (eight) hours as needed for nausea or vomiting.  12/30/17   [provider]  polyethylene glycol (MIRALAX / GLYCOLAX) packet Take 17 g by mouth daily.    [provider]  pregabalin (LYRICA) 100 MG capsule Take 1 capsule (100 mg total) by mouth 2 (two) times daily. 12/05/20   Edward Jolly, MD  propranolol (INDERAL) 40 MG tablet Take 40 mg by mouth 2 (two) times daily.    [provider]  rizatriptan (MAXALT) 10 MG tablet Take 10 mg by mouth as needed for migraine. May repeat in 2 hours if needed    [provider]  rosuvastatin (CRESTOR) 20 MG tablet Take 20 mg by mouth daily.    [provider]  solifenacin (VESICARE) 10 MG tablet Take 10 mg by mouth daily. 09/28/18   [provider]  sucralfate (CARAFATE) 1 g tablet TAKE 1 TABLET BY MOUTH THREE TIMES DAILY BEFORE MEALS. DISSOLVE TABLET IN 1 TABLESPOON OF WATER TO CREATE A SLURRY 04/27/20   [provider]  tolterodine (DETROL) 2 MG tablet Take 2 mg by mouth daily.    [provider]  topiramate (TOPAMAX) 25 MG tablet Take 25 mg by mouth 2 (two) times daily.    [provider]  topiramate (TOPAMAX) 25 MG tablet Take 1 tablet by mouth 2 (two) times daily. Patient not taking: No sig reported 04/28/20   [provider]  traZODone (DESYREL) 100 MG tablet Take 100 mg by mouth at bedtime.  Patient not taking: No sig reported    [provider]  traZODone (DESYREL) 150 MG tablet Take 150 mg by mouth at bedtime. 04/27/20   [provider]  triamcinolone (KENALOG) 0.1 % Apply topically. 05/03/20 05/03/21  [provider]   venlafaxine (EFFEXOR) 37.5 MG tablet Take 37.5 mg by mouth daily.    [provider]  venlafaxine XR (EFFEXOR-XR) 150 MG 24 hr capsule Take 150 mg by mouth daily. Patient not taking: No sig reported 07/15/19   [provider]    Allergies Tramadol, Benadryl [diphenhydramine hcl], Bextra [valdecoxib], Invokana [canagliflozin], and Sulfamethoxazole-trimethoprim  Family History  Problem Relation Age of Onset   Diabetes Mother    Hypertension Mother     Social History Social History   Tobacco Use   Smoking status: Every Day    Packs/day: 0.50    Years: 14.00  Pack years: 7.00    Types: Cigarettes   Smokeless tobacco: Former   Tobacco comments:    had recently quit for about 1 yr.  restarted several months ago  Vaping Use   Vaping Use: Former  Substance Use Topics   Alcohol use: Yes    Alcohol/week: 1.0 standard drink    Types: 1 Glasses of wine per week   Drug use: Not Currently    Comment: 4 years ago used Crack     Review of Systems  Constitutional: No fever/chills Eyes: No visual changes. No discharge ENT: No upper respiratory complaints. Cardiovascular: no chest pain. Respiratory: no cough. No SOB. Gastrointestinal: Left lower quadrant pain radiating into the flank.  Positive o nausea, no vomiting.  Positive diarrhea.  No constipation. Genitourinary: Positive for dysuria. No hematuria Musculoskeletal: Negative for musculoskeletal pain. Skin: Negative for rash, abrasions, lacerations, ecchymosis. Neurological: Negative for headaches, focal weakness or numbness.  10 System ROS otherwise negative.  ____________________________________________   PHYSICAL EXAM:  VITAL SIGNS: ED Triage Vitals  Enc Vitals Group     BP 12/25/20 1410 135/84     Pulse Rate 12/25/20 1410 80     Resp 12/25/20 1410 18     Temp 12/25/20 1410 98.3 F (36.8 C)     Temp Source 12/25/20 1410 Oral     SpO2 12/25/20 1410 95 %     Weight 12/25/20 1411 123 lb 7.3 oz  (56 kg)     Height 12/25/20 1411 4\' 11"  (1.499 m)     Head Circumference --      Peak Flow --      Pain Score 12/25/20 1410 5     Pain Loc --      Pain Edu? --      Excl. in GC? --      Constitutional: Alert and oriented. Well appearing and in no acute distress. Eyes: Conjunctivae are normal. PERRL. EOMI. Head: Atraumatic. ENT:      Ears:       Nose: No congestion/rhinnorhea.      Mouth/Throat: Mucous membranes are moist.  Neck: No stridor.    Cardiovascular: Normal rate, regular rhythm. Normal S1 and S2.  Good peripheral circulation. Respiratory: Normal respiratory effort without tachypnea or retractions. Lungs CTAB. Good air entry to the bases with no decreased or absent breath sounds. Gastrointestinal: No external abdominal findings.  Bowel sounds 4 quadrants. Soft to palpation all quadrants.  Patient is tender in the left lower quadrant pain and palpation does radiate somewhat into the right.. No guarding or rigidity. No palpable masses. No distention. No CVA tenderness. Musculoskeletal: Full range of motion to all extremities. No gross deformities appreciated. Neurologic:  Normal speech and language. No gross focal neurologic deficits are appreciated.  Skin:  Skin is warm, dry and intact. No rash noted. Psychiatric: Mood and affect are normal. Speech and behavior are normal. Patient exhibits appropriate insight and judgement.   ____________________________________________   LABS (all labs ordered are listed, but only abnormal results are displayed)  Labs Reviewed  URINALYSIS, COMPLETE (UACMP) WITH MICROSCOPIC - Abnormal; Notable for the following components:      Result Value   Color, Urine YELLOW (*)    APPearance CLEAR (*)    Glucose, UA >=500 (*)    Protein, ur 100 (*)    Bacteria, UA RARE (*)    All other components within normal limits  CBC - Abnormal; Notable for the following components:   WBC 10.7 (*)  RBC 5.45 (*)    Hemoglobin 16.1 (*)    HCT 48.0 (*)     All other components within normal limits  COMPREHENSIVE METABOLIC PANEL - Abnormal; Notable for the following components:   Sodium 134 (*)    Glucose, Bld 412 (*)    AST 55 (*)    ALT 66 (*)    Alkaline Phosphatase 130 (*)    All other components within normal limits  CBG MONITORING, ED - Abnormal; Notable for the following components:   Glucose-Capillary 237 (*)    All other components within normal limits  LIPASE, BLOOD   ____________________________________________  EKG   ____________________________________________  RADIOLOGY I personally viewed and evaluated these images as part of my medical decision making, as well as reviewing the written report by the radiologist.  ED Provider Interpretation: No acute findings on CT scan.  Patient has diverticulosis without diverticulitis.  No colonic wall thickening.  No appendicitis.  No obstruction  CT ABDOMEN PELVIS W CONTRAST  Result Date: 12/25/2020 CLINICAL DATA:  Abdominal pain, acute, nonlocalized EXAM: CT ABDOMEN AND PELVIS WITH CONTRAST TECHNIQUE: Multidetector CT imaging of the abdomen and pelvis was performed using the standard protocol following bolus administration of intravenous contrast. CONTRAST:  80mL OMNIPAQUE IOHEXOL 350 MG/ML SOLN COMPARISON:  12/07/2017 FINDINGS: Lower chest: No acute abnormality. Hepatobiliary: Decreased attenuation of the hepatic parenchyma compatible with hepatic steatosis. No focal liver lesion is identified. Gallbladder is surgically absent. No biliary dilatation. Pancreas: Unremarkable. No pancreatic ductal dilatation or surrounding inflammatory changes. Spleen: Normal in size without focal abnormality. Adrenals/Urinary Tract: Unremarkable adrenal glands. Kidneys enhance symmetrically without focal lesion, stone, or hydronephrosis. Ureters are nondilated. Urinary bladder appears unremarkable for the degree of distension. Stomach/Bowel: Stomach is within normal limits. Appendix appears normal  (series 2, image 62). Scattered colonic diverticulosis. No evidence of bowel wall thickening, distention, or inflammatory changes. Vascular/Lymphatic: Scattered aortoiliac atherosclerotic calcifications without aneurysm. No abdominopelvic lymphadenopathy. Reproductive: Status post hysterectomy. No adnexal masses. Other: No free fluid. No abdominopelvic fluid collection. No pneumoperitoneum. No abdominal wall hernia. Musculoskeletal: No acute or significant osseous findings. IMPRESSION: 1. No acute abdominopelvic findings. 2. Hepatic steatosis. 3. Scattered colonic diverticulosis without evidence of acute diverticulitis. Aortic Atherosclerosis (ICD10-I70.0). Electronically Signed   By: Duanne Guess D.O.   On: 12/25/2020 18:29    ____________________________________________    PROCEDURES  Procedure(s) performed:    Procedures    Medications  dicyclomine (BENTYL) capsule 10 mg (has no administration in time range)  sodium chloride 0.9 % bolus 1,000 mL (1,000 mLs Intravenous New Bag/Given 12/25/20 1707)  insulin aspart (novoLOG) injection 10 Units (10 Units Intravenous Given 12/25/20 1709)  iohexol (OMNIPAQUE) 350 MG/ML injection 80 mL (80 mLs Intravenous Contrast Given 12/25/20 1744)     ____________________________________________   INITIAL IMPRESSION / ASSESSMENT AND PLAN / ED COURSE  Pertinent labs & imaging results that were available during my care of the patient were reviewed by me and considered in my medical decision making (see chart for details).  Review of the [place] CSRS was performed in accordance of the [facility] prior to dispensing any controlled drugs.  Clinical Course as of 12/25/20 1907  Mon Dec 25, 2020  1545 RDW: 13.5 [EG]    Clinical Course User Index [EG] Gifford Shave, Student-PA          Patient's diagnosis is consistent with abdominal pain, IBS.  Patient presents the emergency department with 3-week history of abdominal pain, diarrhea.  There is no fevers,  emesis.  Overall  exam revealed tenderness on the left side though she did have some mild tenderness throughout her abdomen.  No palpable abnormality.  Labs are reassuring.  CT scan reveals diverticulosis without diverticulitis.  No colitis.  No obstruction.  No other concerning findings on the CT.  This time I suspect the patient's symptoms are likely an IBS flare.  She will be placed on Bentyl for symptom relief.  Patient has a follow-up appointment scheduled in roughly a month with her GI doctor.  She may reach out to them for additional symptom control medicines if needed.  Return precautions discussed with the patient. Patient is given ED precautions to return to the ED for any worsening or new symptoms.     ____________________________________________  FINAL CLINICAL IMPRESSION(S) / ED DIAGNOSES  Final diagnoses:  Generalized abdominal pain  Irritable bowel syndrome with diarrhea      NEW MEDICATIONS STARTED DURING THIS VISIT:  ED Discharge Orders          Ordered    dicyclomine (BENTYL) 10 MG capsule  4 times daily        12/25/20 1907    loperamide (IMODIUM A-D) 2 MG tablet  4 times daily PRN        12/25/20 1907                This chart was dictated using voice recognition software/Dragon. Despite best efforts to proofread, errors can occur which can change the meaning. Any change was purely unintentional.    Racheal Patches, PA-C 12/25/20 1907    Gilles Chiquito, MD 12/25/20 [phone]

## 2020-12-25 NOTE — ED Triage Notes (Signed)
Ambulatory to triage room c/o lower abdominal pain that radiates to left flank for last week. Worse in the morning on awakening, less at night. +nausea, diarrhea. Pt alert and oriented X4, cooperative, RR even and unlabored, color WNL. Pt in NAD.

## 2021-01-11 ENCOUNTER — Encounter: Payer: Self-pay | Admitting: Student in an Organized Health Care Education/Training Program

## 2021-01-15 ENCOUNTER — Encounter: Payer: Self-pay | Admitting: Student in an Organized Health Care Education/Training Program

## 2021-01-15 ENCOUNTER — Telehealth: Payer: Self-pay | Admitting: Student in an Organized Health Care Education/Training Program

## 2021-01-15 ENCOUNTER — Ambulatory Visit
Payer: Medicaid Other | Attending: Student in an Organized Health Care Education/Training Program | Admitting: Student in an Organized Health Care Education/Training Program

## 2021-01-15 ENCOUNTER — Other Ambulatory Visit: Payer: Self-pay | Admitting: *Deleted

## 2021-01-15 ENCOUNTER — Other Ambulatory Visit: Payer: Self-pay

## 2021-01-15 DIAGNOSIS — M5416 Radiculopathy, lumbar region: Secondary | ICD-10-CM

## 2021-01-15 DIAGNOSIS — G894 Chronic pain syndrome: Secondary | ICD-10-CM

## 2021-01-15 DIAGNOSIS — M1711 Unilateral primary osteoarthritis, right knee: Secondary | ICD-10-CM

## 2021-01-15 MED ORDER — HYDROCODONE-ACETAMINOPHEN 7.5-325 MG PO TABS: 1.0000 | ORAL_TABLET | Freq: Three times a day (TID) | ORAL | 0 refills | Status: DC | PRN

## 2021-01-15 MED ORDER — HYDROCODONE-ACETAMINOPHEN 7.5-325 MG PO TABS: 1.0000 | ORAL_TABLET | Freq: Three times a day (TID) | ORAL | 0 refills | Status: AC | PRN

## 2021-01-15 NOTE — Telephone Encounter (Signed)
Spoke with patient to clarify what she is needing resent.  Her original pharmacy has closed down.  She would like Rx to be resent to [facility].  This will be for hydrocodone - apap 7.5 - 325 mg for October and November.  I have changed the pharmacy in [place].

## 2021-01-15 NOTE — Progress Notes (Signed)
Patient: [name]  Service Category: E/M  Provider: Gillis Santa, MD  DOB: [birth year]  DOS: 01/15/2021  Location: Office  MRN: [id number]  Setting: Ambulatory outpatient  Referring Provider: Sharyne Peach, MD  Type: Established Patient  Specialty: Interventional Pain Management  PCP: Sharyne Peach, MD  Location: Remote location  Delivery: TeleHealth     Virtual Encounter - Pain Management PROVIDER NOTE: Information contained herein reflects review and annotations entered in association with encounter. Interpretation of such information and data should be left to medically-trained personnel. Information provided to patient can be located elsewhere in the medical record under "Patient Instructions". Document created using STT-dictation technology, any transcriptional errors that may result from process are unintentional.    Contact & Pharmacy Preferred: [phone] Home: [phone] (home) Mobile: [phone] (mobile) E-mail: No e-mail address on record  [id number] [place] [address] [id number]: [phone]: [phone]  Pre-screening  [name] offered "in-person" vs "virtual" encounter. She indicated preferring virtual for this encounter.   Reason COVID-19*  Social distancing based on CDC and AMA recommendations.   I contacted [name] on 01/15/2021 via telephone.      I clearly identified myself as BGillis Santa MD. I verified that I was speaking with the correct person using two identifiers (Name: [name] and date of birth: [birth year].  Consent I sought verbal advanced consent from JCharltonfor virtual visit interactions. I informed [name] of possible security and privacy concerns, risks, and limitations associated with providing "not-in-person" medical evaluation and management services. I also informed Ms. Eckrich of the availability of "in-person"  appointments. Finally, I informed her that there would be a charge for the virtual visit and that she could be  personally, fully or partially, financially responsible for it. Ms. CChohanexpressed understanding and agreed to proceed.   Historic Elements   Ms. JFRANK PILGERis a 60y.o. year old, female patient evaluated today after our last contact on 01/15/2021. [name] has a past medical history of Asthma, Asthma, BPPV (benign paroxysmal positional vertigo), Cardiac murmur, unspecified (08/28/2016), CPAP (continuous positive airway pressure) dependence, Degenerative disc disease, lumbar, Depression, Diabetes mellitus, Diverticulitis (10/2016), Fatty liver, Fibromyalgia, Fibromyalgia, GERD (gastroesophageal reflux disease), Headache, History of degenerative disc disease, Hyperlipidemia, Hypertension, and Sleep apnea. She also  has a past surgical history that includes Abdominal surgery; Abdominal hysterectomy; Esophagogastroduodenoscopy (egd) with propofol (N/A, 02/20/2017); Colonoscopy with propofol (N/A, 02/20/2017); Upper esophageal endoscopic ultrasound (eus) (N/A, 04/03/2017); Diverticulitis; EUS (N/A, 01/08/2018); Cholecystectomy (N/A, 04/27/2018); Cataract extraction w/PHACO (Left, 09/07/2019); and Cataract extraction w/PHACO (Right, 09/28/2019). Ms. CMoracehas a current medication list which includes the following prescription(s): accu-chek guide, albuterol, amlodipine, aripiprazole, aspirin, budesonide-formoterol, budesonide-formoterol, cetirizine, dexlansoprazole, famotidine, fexofenadine, [START ON 02/12/2021] hydrocodone-acetaminophen, hydroxyzine, hydroxyzine, ibuprofen, insulin regular, levemir flextouch, linaclotide, loperamide, losartan, metformin, mirtazapine, montelukast, ondansetron, polyethylene glycol, pregabalin, propranolol, rizatriptan, rosuvastatin, solifenacin, sucralfate, testosterone cypionate, tolterodine, topiramate, trazodone, triamcinolone cream, venlafaxine,  dicyclomine, hydrocodone-acetaminophen, [START ON 02/14/2021] hydrocodone-acetaminophen, alive once daily womens, topiramate, trazodone, and venlafaxine xr. She  reports that she has been smoking cigarettes. She has a 7.00 pack-year smoking history. She has quit using smokeless tobacco. She reports current alcohol use of about 1.0 standard drink per week. She reports that she does not currently use drugs. Ms. CBickleis allergic to tramadol, benadryl [diphenhydramine hcl], bextra [valdecoxib], invokana [canagliflozin], and sulfamethoxazole-trimethoprim.   HPI  Today, she is being contacted for both,  medication management and a post-procedure assessment.   Post-Procedure Evaluation  Procedure (12/11/2020):   Type: Therapeutic Intra-Articular  MONOVISC  Knee Injection #3  Region: Medial infrapatellar Knee Region Level: Knee Joint Laterality: Bilateral  Anxiolysis: Please see nurses note.  Effectiveness during initial hour after procedure (Ultra-Short Term Relief): 90 %   Local anesthetic used: Long-acting (4-6 hours) Effectiveness: Defined as any analgesic benefit obtained secondary to the administration of local anesthetics. This carries significant diagnostic value as to the etiological location, or anatomical origin, of the pain. Duration of benefit is expected to coincide with the duration of the local anesthetic used.  Effectiveness during initial 4-6 hours after procedure (Short-Term Relief): 90 %   Long-term benefit: Defined as any relief past the pharmacologic duration of the local anesthetics.  Effectiveness past the initial 6 hours after procedure (Long-Term Relief): 80 %   Benefits, current: Defined as benefit present at the time of this evaluation.   Analgesia:  80% Function: Ms. Arai reports improvement in function    Pharmacotherapy Assessment   Analgesic: Norco 7.5 mg TID PRN #90/month   Monitoring: [place] PMP: PDMP reviewed during this encounter.        Pharmacotherapy: No side-effects or adverse reactions reported. Compliance: No problems identified. Effectiveness: Clinically acceptable. Plan: Refer to "POC". UDS:  Summary  Date Value Ref Range Status  03/07/2020 Note  Final    Comment:    ==================================================================== [employer] Select 13 (MW) ==================================================================== Test                             Result       Flag       Units  Drug Present and Declared for Prescription Verification   Hydrocodone                    276          EXPECTED   ng/mg creat   Hydromorphone                  159          EXPECTED   ng/mg creat   Dihydrocodeine                 180          EXPECTED   ng/mg creat   Norhydrocodone                 523          EXPECTED   ng/mg creat    Sources of hydrocodone include scheduled prescription medications.    Hydromorphone, dihydrocodeine and norhydrocodone are expected    metabolites of hydrocodone. Hydromorphone and dihydrocodeine are    also available as scheduled prescription medications.  ==================================================================== Test                      Result    Flag   Units      Ref Range   Creatinine              74               mg/dL      >=20 ==================================================================== Declared Medications:  The flagging and interpretation on this report are based on the  following declared medications.  Unexpected results may arise from  inaccuracies in the declared medications.   **Note: The testing scope of this panel includes these medications:  Hydrocodone (Norco)   **Note: The testing scope of this panel does not include the  following reported medications:   Acetaminophen (Norco)  Albuterol (Ventolin HFA)  Aspirin  Budesonide (Symbicort)  Dexlansoprazole  Famotidine (Pepcid)  Fesoterodine (Toviaz)  Fexofenadine (Allegra)  Fluticasone  (Flonase)  Formoterol (Symbicort)  Insulin (Levemir)  Iron  Linaclotide (Linzess)  Losartan (Cozaar)  Magnesium  Metformin  Mirtazapine (Remeron)  Multivitamin  Ondansetron (Zofran)  Polyethylene Glycol (MiraLAX)  Pregabalin (Lyrica)  Rizatriptan (Maxalt)  Rosuvastatin (Crestor)  Solifenacin (Vesicare)  Topiramate (Topamax)  Trazodone (Desyrel)  Venlafaxine (Effexor) ==================================================================== For clinical consultation, please call [phone]. ====================================================================      Laboratory Chemistry Profile   Renal Lab Results  Component Value Date   BUN 9 12/25/2020   CREATININE 0.73 12/25/2020   BCR 14 11/27/2017   GFRAA >60 08/09/2019   GFRNONAA >60 12/25/2020    Hepatic Lab Results  Component Value Date   AST 55 (H) 12/25/2020   ALT 66 (H) 12/25/2020   ALBUMIN 4.0 12/25/2020   ALKPHOS 130 (H) 12/25/2020   LIPASE 40 12/25/2020    Electrolytes Lab Results  Component Value Date   NA 134 (L) 12/25/2020   K 4.3 12/25/2020   CL 104 12/25/2020   CALCIUM 9.1 12/25/2020   MG 1.8 11/27/2017    Bone No results found for: VD25OH, VD125OH2TOT, XO3291BT6, OM6004HT9, 25OHVITD1, 25OHVITD2, 25OHVITD3, TESTOFREE, TESTOSTERONE  Inflammation (CRP: Acute Phase) (ESR: Chronic Phase) Lab Results  Component Value Date   CRP <1 11/27/2017   ESRSEDRATE 17 11/27/2017   LATICACIDVEN 1.6 10/14/2016         Note: Above Lab results reviewed.  Imaging  CT ABDOMEN PELVIS W CONTRAST CLINICAL DATA:  Abdominal pain, acute, nonlocalized  EXAM: CT ABDOMEN AND PELVIS WITH CONTRAST  TECHNIQUE: Multidetector CT imaging of the abdomen and pelvis was performed using the standard protocol following bolus administration of intravenous contrast.  CONTRAST:  89m OMNIPAQUE IOHEXOL 350 MG/ML SOLN  COMPARISON:  12/07/2017  FINDINGS: Lower chest: No acute abnormality.  Hepatobiliary: Decreased  attenuation of the hepatic parenchyma compatible with hepatic steatosis. No focal liver lesion is identified. Gallbladder is surgically absent. No biliary dilatation.  Pancreas: Unremarkable. No pancreatic ductal dilatation or surrounding inflammatory changes.  Spleen: Normal in size without focal abnormality.  Adrenals/Urinary Tract: Unremarkable adrenal glands. Kidneys enhance symmetrically without focal lesion, stone, or hydronephrosis. Ureters are nondilated. Urinary bladder appears unremarkable for the degree of distension.  Stomach/Bowel: Stomach is within normal limits. Appendix appears normal (series 2, image 62). Scattered colonic diverticulosis. No evidence of bowel wall thickening, distention, or inflammatory changes.  Vascular/Lymphatic: Scattered aortoiliac atherosclerotic calcifications without aneurysm. No abdominopelvic lymphadenopathy.  Reproductive: Status post hysterectomy. No adnexal masses.  Other: No free fluid. No abdominopelvic fluid collection. No pneumoperitoneum. No abdominal wall hernia.  Musculoskeletal: No acute or significant osseous findings.  IMPRESSION: 1. No acute abdominopelvic findings. 2. Hepatic steatosis. 3. Scattered colonic diverticulosis without evidence of acute diverticulitis.  Aortic Atherosclerosis (ICD10-I70.0).  Electronically Signed   By: NDavina PokeD.O.   On: 12/25/2020 18:29  Assessment  The primary encounter diagnosis was Primary osteoarthritis of right knee. Diagnoses of Chronic pain syndrome and Lumbar radiculopathy were also pertinent to this visit.  Plan of Care   1.  Good relief after Monovisc injection for bilateral knee pain.  Is endorsing analgesic and functional benefit.  Repeat as needed.  2.  Patient states that her pharmacy has closed.  She needs her hydrocodone prescriptions sent to her  new pharmacy, [facility]  New prescription sent in as below.  Continue  with original follow-up in December for medication management.   Pharmacotherapy (Medications Ordered): Meds ordered this encounter  Medications   HYDROcodone-acetaminophen (NORCO) 7.5-325 MG tablet    Sig: Take 1 tablet by mouth every 8 (eight) hours as needed for severe pain. Must last 30 days.    Dispense:  90 tablet    Refill:  0    Chronic Pain. (STOP Act - Not applicable). Fill one day early if closed on scheduled refill date.   HYDROcodone-acetaminophen (NORCO) 7.5-325 MG tablet    Sig: Take 1 tablet by mouth every 8 (eight) hours as needed for severe pain. Must last 30 days.    Dispense:  90 tablet    Refill:  0    Chronic Pain. (STOP Act - Not applicable). Fill one day early if closed on scheduled refill date.    Orders:  No orders of the defined types were placed in this encounter.  Follow-up plan:   Return for Keep sch. appt.    Recent Visits Date Type Provider Dept  12/11/20 Procedure visit Gillis Santa, MD [facility]  12/05/20 Office Visit Gillis Santa, MD [facility]  Showing recent visits within past 90 days and meeting all other requirements Today's Visits Date Type Provider Dept  01/15/21 Office Visit Gillis Santa, MD [facility]  Showing today's visits and meeting all other requirements Future Appointments Date Type Provider Dept  03/06/21 Appointment Gillis Santa, MD [facility]  Showing future appointments within next 90 days and meeting all other requirements I discussed the assessment and treatment plan with the patient. The patient was provided an opportunity to ask questions and all were answered. The patient agreed with the plan and demonstrated an understanding of the instructions.  Patient advised to call back or seek an in-person evaluation if the symptoms or condition worsens.  Duration of encounter: 41mnutes.  Note by: BGillis Santa MD Date: 01/15/2021; Time: 1:23 PM

## 2021-01-16 ENCOUNTER — Emergency Department: Payer: Medicaid Other

## 2021-01-16 ENCOUNTER — Other Ambulatory Visit: Payer: Self-pay

## 2021-01-16 ENCOUNTER — Emergency Department
Admission: EM | Admit: 2021-01-16 | Discharge: 2021-01-16 | Disposition: A | Discharge status: Home / Self Care | Payer: Medicaid Other | Attending: Emergency Medicine | Admitting: Emergency Medicine

## 2021-01-16 DIAGNOSIS — R0789 Other chest pain: Secondary | ICD-10-CM | POA: Insufficient documentation

## 2021-01-16 DIAGNOSIS — R110 Nausea: Secondary | ICD-10-CM | POA: Insufficient documentation

## 2021-01-16 DIAGNOSIS — Z5321 Procedure and treatment not carried out due to patient leaving prior to being seen by health care provider: Secondary | ICD-10-CM | POA: Insufficient documentation

## 2021-01-16 LAB — CBC
HCT: 49.1 % — ABNORMAL HIGH (ref 36.0–46.0)
Hemoglobin: 16 g/dL — ABNORMAL HIGH (ref 12.0–15.0)
MCH: 28.9 pg (ref 26.0–34.0)
MCHC: 32.6 g/dL (ref 30.0–36.0)
MCV: 88.8 fL (ref 80.0–100.0)
Platelets: 194 10*3/uL (ref 150–400)
RBC: 5.53 MIL/uL — ABNORMAL HIGH (ref 3.87–5.11)
RDW: 13.6 % (ref 11.5–15.5)
WBC: 10.1 10*3/uL (ref 4.0–10.5)
nRBC: 0 % (ref 0.0–0.2)

## 2021-01-16 LAB — BASIC METABOLIC PANEL
Anion gap: 10 (ref 5–15)
BUN: 7 mg/dL (ref 6–20)
CO2: 23 mmol/L (ref 22–32)
Calcium: 9.5 mg/dL (ref 8.9–10.3)
Chloride: 103 mmol/L (ref 98–111)
Creatinine, Ser: 0.54 mg/dL (ref 0.44–1.00)
GFR, Estimated: >60 mL/min (ref >60)
Glucose, Bld: 408 mg/dL — ABNORMAL HIGH (ref 70–99)
Potassium: 4.1 mmol/L (ref 3.5–5.1)
Sodium: 136 mmol/L (ref 135–145)

## 2021-01-16 LAB — TROPONIN I (HIGH SENSITIVITY)
Troponin I (High Sensitivity): 7 ng/L (ref <18)
Troponin I (High Sensitivity): 7 ng/L (ref <18)

## 2021-01-16 NOTE — ED Triage Notes (Signed)
Pt to ED centralized chest pain radiating to left arm that started this am. +nausea Denies cardiac hx

## 2021-03-06 ENCOUNTER — Other Ambulatory Visit: Payer: Self-pay

## 2021-03-06 ENCOUNTER — Ambulatory Visit
Payer: Medicaid Other | Attending: Student in an Organized Health Care Education/Training Program | Admitting: Student in an Organized Health Care Education/Training Program

## 2021-03-06 ENCOUNTER — Encounter: Payer: Self-pay | Admitting: Student in an Organized Health Care Education/Training Program

## 2021-03-06 VITALS — BP 165/104 | HR 103 | Temp 96.6°F | Resp 16 | Ht 59.0 in | Wt 120.0 lb

## 2021-03-06 DIAGNOSIS — M7918 Myalgia, other site: Secondary | ICD-10-CM | POA: Insufficient documentation

## 2021-03-06 DIAGNOSIS — M1712 Unilateral primary osteoarthritis, left knee: Secondary | ICD-10-CM | POA: Diagnosis not present

## 2021-03-06 DIAGNOSIS — M5416 Radiculopathy, lumbar region: Secondary | ICD-10-CM | POA: Diagnosis present

## 2021-03-06 DIAGNOSIS — G894 Chronic pain syndrome: Secondary | ICD-10-CM | POA: Diagnosis present

## 2021-03-06 DIAGNOSIS — M1711 Unilateral primary osteoarthritis, right knee: Secondary | ICD-10-CM | POA: Diagnosis not present

## 2021-03-06 DIAGNOSIS — G8929 Other chronic pain: Secondary | ICD-10-CM | POA: Diagnosis present

## 2021-03-06 DIAGNOSIS — F1190 Opioid use, unspecified, uncomplicated: Secondary | ICD-10-CM | POA: Insufficient documentation

## 2021-03-06 DIAGNOSIS — M797 Fibromyalgia: Secondary | ICD-10-CM | POA: Diagnosis present

## 2021-03-06 MED ORDER — HYDROCODONE-ACETAMINOPHEN 7.5-325 MG PO TABS: 1.0000 | ORAL_TABLET | Freq: Three times a day (TID) | ORAL | 0 refills | Status: DC | PRN

## 2021-03-06 MED ORDER — HYDROCODONE-ACETAMINOPHEN 7.5-325 MG PO TABS: 1.0000 | ORAL_TABLET | Freq: Three times a day (TID) | ORAL | 0 refills | Status: AC | PRN

## 2021-03-06 NOTE — Progress Notes (Signed)
PROVIDER NOTE: Information contained herein reflects review and annotations entered in association with encounter. Interpretation of such information and data should be left to medically-trained personnel. Information provided to patient can be located elsewhere in the medical record under "Patient Instructions". Document created using STT-dictation technology, any transcriptional errors that may result from process are unintentional.    Patient: [name]  Service Category: E/M  Provider: Gillis Santa, MD  DOB: [birth year]  DOS: 03/06/2021  Specialty: Interventional Pain Management  MRN: [id number]  Setting: Ambulatory outpatient  PCP: Sharyne Peach, MD  Type: Established Patient    Referring Provider: Sharyne Peach, MD  Location: Office  Delivery: Face-to-face     HPI  [name], a 60 y.o. year old female, is here today because of her Primary osteoarthritis of right knee [M17.11]. [name] primary complain today is Back Pain (lower) Last encounter: My last encounter with her was on 01/15/2021. Pertinent problems: [name] has Lumbar radiculopathy; Chronic bilateral low back pain with bilateral sciatica; Chronic pain syndrome; Chronic, continuous use of opioids; Chronic myofascial pain; Primary osteoarthritis of both hips; Type 2 diabetes mellitus without complications ([place]); and Pain in both hands on their pertinent problem list. Pain Assessment: Severity of Chronic pain is reported as a 8 /10. Location: Back Lower/both legs to the feet. Onset: More than a month ago. Quality: Other (Comment) (aweful). Timing: Constant. Modifying factor(s): medications. Vitals:  height is 4' 11" (1.499 m) and weight is 120 lb (54.4 kg). Her temporal temperature is 96.6 F (35.9 C) (abnormal). Her blood pressure is 165/104 (abnormal) and her pulse is 103 (abnormal). Her respiration is 16 and oxygen saturation is 96%.   Reason for encounter:   Patient states that her  knees are still doing well after her Monovisc injection done in September.  She finds it easier to bear weight and perform household chores.  Is dealing with constipation.  Is on Linzess.  Recommend she supplement with Dulcolax and MiraLAX if he goes more than 48 hours without a bowel movement.  Pharmacotherapy Assessment  Analgesic: Norco 7.5 mg TID PRN #90/month   Monitoring: [place] PMP: PDMP reviewed during this encounter.       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 Effectiveness: Clinically acceptable.  Landis Martins, RN  03/06/2021  9:32 AM  Sign when Signing Visit Nursing Pain Medication Assessment:  Safety precautions to be maintained throughout the outpatient stay will include: orient to surroundings, keep bed in low position, maintain call bell within reach at all times, provide assistance with transfer out of bed and ambulation.  Medication Inspection Compliance: Pill count conducted under aseptic conditions, in front of the patient. Neither the pills nor the bottle was removed from the patient's sight at any time. Once count was completed pills were immediately returned to the patient in their original bottle.  Medication: Hydrocodone/APAP Pill/Patch Count:  29 of 90 pills remain Pill/Patch Appearance: Markings consistent with prescribed medication Bottle Appearance: Standard pharmacy container. Clearly labeled. Filled Date: 77 / 23 / 2022 Last Medication intake:  Today      UDS:  Summary  Date Value Ref Range Status  03/07/2020 Note  Final    Comment:    ==================================================================== [facility] (MW) ==================================================================== Test                             Result       Flag  Units  Drug Present and Declared for Prescription Verification   Hydrocodone                    276          EXPECTED   ng/mg creat    Hydromorphone                  159          EXPECTED   ng/mg creat   Dihydrocodeine                 180          EXPECTED   ng/mg creat   Norhydrocodone                 523          EXPECTED   ng/mg creat    Sources of hydrocodone include scheduled prescription medications.    Hydromorphone, dihydrocodeine and norhydrocodone are expected    metabolites of hydrocodone. Hydromorphone and dihydrocodeine are    also available as scheduled prescription medications.  ==================================================================== Test                      Result    Flag   Units      Ref Range   Creatinine              74               mg/dL      >=20 ==================================================================== Declared Medications:  The flagging and interpretation on this report are based on the  following declared medications.  Unexpected results may arise from  inaccuracies in the declared medications.   **Note: The testing scope of this panel includes these medications:   Hydrocodone (Norco)   **Note: The testing scope of this panel does not include the  following reported medications:   Acetaminophen (Norco)  Albuterol (Ventolin HFA)  Aspirin  Budesonide (Symbicort)  Dexlansoprazole  Famotidine (Pepcid)  Fesoterodine (Toviaz)  Fexofenadine (Allegra)  Fluticasone (Flonase)  Formoterol (Symbicort)  Insulin (Levemir)  Iron  Linaclotide (Linzess)  Losartan (Cozaar)  Magnesium  Metformin  Mirtazapine (Remeron)  Multivitamin  Ondansetron (Zofran)  Polyethylene Glycol (MiraLAX)  Pregabalin (Lyrica)  Rizatriptan (Maxalt)  Rosuvastatin (Crestor)  Solifenacin (Vesicare)  Topiramate (Topamax)  Trazodone (Desyrel)  Venlafaxine (Effexor) ==================================================================== For clinical consultation, please call [phone]. ====================================================================      ROS  Constitutional: Denies  any fever or chills Gastrointestinal: No reported hemesis, hematochezia, vomiting, or acute GI distress Musculoskeletal: Denies any acute onset joint swelling, redness, loss of ROM, or weakness Neurological: No reported episodes of acute onset apraxia, aphasia, dysarthria, agnosia, amnesia, paralysis, loss of coordination, or loss of consciousness  Medication Review  ARIPiprazole, Alive Once Daily Womens, Dexlansoprazole, HYDROcodone-acetaminophen, albuterol, amLODipine, aspirin, budesonide-formoterol, cetirizine, dicyclomine, ergocalciferol, famotidine, fexofenadine, glucose blood, hydrOXYzine, ibuprofen, insulin detemir, insulin regular, linaclotide, loperamide, losartan, metFORMIN, mirtazapine, montelukast, ondansetron, polyethylene glycol, pregabalin, propranolol, rizatriptan, rosuvastatin, solifenacin, sucralfate, testosterone cypionate, tolterodine, topiramate, traZODone, triamcinolone cream, venlafaxine, and venlafaxine XR  History Review  Allergy: [name] is allergic to tramadol, benadryl [diphenhydramine hcl], bextra [valdecoxib], invokana [canagliflozin], and sulfamethoxazole-trimethoprim. Drug: [name]  reports that she does not currently use drugs. Alcohol:  reports current alcohol use of about 1.0 standard drink per week. Tobacco:  reports that she has been smoking cigarettes. She has a 7.00 pack-year smoking history. She has quit using smokeless tobacco. Social: [name]  reports that  she has been smoking cigarettes. She has a 7.00 pack-year smoking history. She has quit using smokeless tobacco. She reports current alcohol use of about 1.0 standard drink per week. She reports that she does not currently use drugs. Medical:  has a past medical history of Asthma, Asthma, BPPV (benign paroxysmal positional vertigo), Cardiac murmur, unspecified (08/28/2016), CPAP (continuous positive airway pressure) dependence, Degenerative disc disease, lumbar, Depression, Diabetes  mellitus, Diverticulitis (10/2016), Fatty liver, Fibromyalgia, Fibromyalgia, GERD (gastroesophageal reflux disease), Headache, History of degenerative disc disease, Hyperlipidemia, Hypertension, and Sleep apnea. Surgical: [name]  has a past surgical history that includes Abdominal surgery; Abdominal hysterectomy; Esophagogastroduodenoscopy (egd) with propofol (N/A, 02/20/2017); Colonoscopy with propofol (N/A, 02/20/2017); Upper esophageal endoscopic ultrasound (eus) (N/A, 04/03/2017); Diverticulitis; EUS (N/A, 01/08/2018); Cholecystectomy (N/A, 04/27/2018); Cataract extraction w/PHACO (Left, 09/07/2019); and Cataract extraction w/PHACO (Right, 09/28/2019). Family: family history includes Diabetes in her mother; Hypertension in her mother.  Laboratory Chemistry Profile   Renal Lab Results  Component Value Date   BUN 7 01/16/2021   CREATININE 0.54 01/16/2021   BCR 14 11/27/2017   GFRAA >60 08/09/2019   GFRNONAA >60 01/16/2021    Hepatic Lab Results  Component Value Date   AST 55 (H) 12/25/2020   ALT 66 (H) 12/25/2020   ALBUMIN 4.0 12/25/2020   ALKPHOS 130 (H) 12/25/2020   LIPASE 40 12/25/2020    Electrolytes Lab Results  Component Value Date   NA 136 01/16/2021   K 4.1 01/16/2021   CL 103 01/16/2021   CALCIUM 9.5 01/16/2021   MG 1.8 11/27/2017    Bone No results found for: VD25OH, VD125OH2TOT, ZO1096EA5, [id number], 25OHVITD1, 25OHVITD2, 25OHVITD3, TESTOFREE, TESTOSTERONE  Inflammation (CRP: Acute Phase) (ESR: Chronic Phase) Lab Results  Component Value Date   CRP <1 11/27/2017   ESRSEDRATE 17 11/27/2017   LATICACIDVEN 1.6 10/14/2016         Note: Above Lab results reviewed.  Recent Imaging Review  DG Chest 2 View CLINICAL DATA:  Chest pain. Additional provided: Chest pain radiating to left arm, nausea.  EXAM: CHEST - 2 VIEW  COMPARISON:  Prior chest radiographs 05/16/2015 and earlier.  FINDINGS: Heart size within normal limits. Aortic  atherosclerosis. Redemonstrated scarring within the right lower lung field. No appreciable airspace consolidation or pulmonary edema. No evidence of pleural effusion or pneumothorax. No acute bony abnormality identified. Mild thoracic dextrocurvature. Surgical clips within the right upper quadrant of the abdomen.  IMPRESSION: No evidence of acute cardiopulmonary abnormality.  Redemonstrated scarring within the right lower lung field.  No appreciable airspace consolidation or pulmonary edema.  Aortic Atherosclerosis (ICD10-I70.0).  Thoracic dextrocurvature.  Electronically Signed   By: Kellie Simmering D.O.   On: 01/16/2021 15:06 Note: Reviewed        Physical Exam  General appearance: Well nourished, well developed, and well hydrated. In no apparent acute distress Mental status: Alert, oriented x 3 (person, place, & time)       Respiratory: No evidence of acute respiratory distress Eyes: PERLA Vitals: BP (!) 165/104    Pulse (!) 103    Temp (!) 96.6 F (35.9 C) (Temporal)    Resp 16    Ht 4' 11" (1.499 m)    Wt 120 lb (54.4 kg)    LMP  (LMP Unknown) Comment: "many years"   SpO2 96%    BMI 24.24 kg/m  BMI: Estimated body mass index is 24.24 kg/m as calculated from the following:   Height as of this encounter: 4' 11" (1.499 m).  Weight as of this encounter: 120 lb (54.4 kg). Ideal: Patient must be at least 60 in tall to calculate ideal body weight  Assessment   Status Diagnosis  Controlled Controlled Controlled 1. Primary osteoarthritis of right knee   2. Arthritis of left knee   3. Chronic myofascial pain   4. Chronic, continuous use of opioids   5. Lumbar radiculopathy   6. Fibromyalgia   7. Chronic pain syndrome      Updated Problems: No problems updated.  Plan of Care  Problem-specific:  No problem-specific Assessment & Plan notes found for this encounter.  [name] has a current medication list which includes the following long-term  medication(s): albuterol, aripiprazole, budesonide-formoterol, dexlansoprazole, famotidine, fexofenadine, insulin regular, linaclotide, losartan, metformin, mirtazapine, pregabalin, propranolol, rizatriptan, topiramate, trazodone, venlafaxine, venlafaxine xr, budesonide-formoterol, dicyclomine, testosterone cypionate, topiramate, and trazodone.  Pharmacotherapy (Medications Ordered): Meds ordered this encounter  Medications   HYDROcodone-acetaminophen (NORCO) 7.5-325 MG tablet    Sig: Take 1 tablet by mouth every 8 (eight) hours as needed for severe pain. Must last 30 days.    Dispense:  90 tablet    Refill:  0    Chronic Pain. (STOP Act - Not applicable). Fill one day early if closed on scheduled refill date.   HYDROcodone-acetaminophen (NORCO) 7.5-325 MG tablet    Sig: Take 1 tablet by mouth every 8 (eight) hours as needed for severe pain. Must last 30 days.    Dispense:  90 tablet    Refill:  0    Chronic Pain. (STOP Act - Not applicable). Fill one day early if closed on scheduled refill date.   HYDROcodone-acetaminophen (NORCO) 7.5-325 MG tablet    Sig: Take 1 tablet by mouth every 8 (eight) hours as needed for severe pain. Must last 30 days.    Dispense:  90 tablet    Refill:  0    Chronic Pain. (STOP Act - Not applicable). Fill one day early if closed on scheduled refill date.   -UDS at next visit -Continue Lyrica, Topamax as prescribed -Consider Qulipta for migraine  Follow-up plan:   Return in about 14 weeks (around 06/12/2021) for Medication Management, in person.     Recent Visits Date Type Provider Dept  01/15/21 Office Visit Gillis Santa, MD [facility]  12/11/20 Procedure visit Gillis Santa, MD [facility]  Showing recent visits within past 90 days and meeting all other requirements Today's Visits Date Type Provider Dept  03/06/21 Office Visit Gillis Santa, MD [facility]  Showing today's visits and meeting all other  requirements Future Appointments Date Type Provider Dept  05/29/21 Appointment Gillis Santa, MD [facility]  Showing future appointments within next 90 days and meeting all other requirements I discussed the assessment and treatment plan with the patient. The patient was provided an opportunity to ask questions and all were answered. The patient agreed with the plan and demonstrated an understanding of the instructions.  Patient advised to call back or seek an in-person evaluation if the symptoms or condition worsens.  Duration of encounter: 79mnutes.  Note by: BGillis Santa MD Date: 03/06/2021; Time: 9:59 AM

## 2021-03-06 NOTE — Progress Notes (Deleted)
Patient: [name]  Service Category: E/M  Provider: Gillis Santa, MD  DOB: 11-20-1960  DOS: 03/06/2021  Location: Office  MRN: [id number]  Setting: Ambulatory outpatient  Referring Provider: Sharyne Peach, MD  Type: Established Patient  Specialty: Interventional Pain Management  PCP: Sharyne Peach, MD  Location: Remote location  Delivery: TeleHealth     Virtual Encounter - Pain Management PROVIDER NOTE: Information contained herein reflects review and annotations entered in association with encounter. Interpretation of such information and data should be left to medically-trained personnel. Information provided to patient can be located elsewhere in the medical record under "Patient Instructions". Document created using STT-dictation technology, any transcriptional errors that may result from process are unintentional.    Contact & Pharmacy Preferred: [phone] Home: [phone] (home) Mobile: [phone] (mobile) E-mail: No e-mail address on record  [id number] [place] [id number]: [phone]: [phone]  Pre-screening  [name] offered "in-person" vs "virtual" encounter. She indicated preferring virtual for this encounter.   Reason COVID-19*   Social distancing based on CDC and AMA recommendations.   I contacted JSunnie Cook on 03/06/2021 via telephone.      I clearly identified myself as BGillis Santa MD. I verified that I was speaking with the correct person using two identifiers (Name: [name] and date of birth: 712-13-1962.  Consent I sought verbal advanced consent from [place] virtual visit interactions. I informed [name] of possible security and privacy concerns, risks, and limitations associated with providing "not-in-person" medical evaluation and management services. I also informed [name] of the availability of "in-person"  appointments. Finally, I informed her that there would be a charge for the virtual visit and that she could be  personally, fully or partially, financially responsible for it. [name] understanding and agreed to proceed.   Historic Elements   Ms. Erika BALESis a 60y.o. year old, female patient evaluated today after our last contact on 01/15/2021. [name] has a past medical history of Asthma, Asthma, BPPV (benign paroxysmal positional vertigo), Cardiac murmur, unspecified (08/28/2016), CPAP (continuous positive airway pressure) dependence, Degenerative disc disease, lumbar, Depression, Diabetes mellitus, Diverticulitis (10/2016), Fatty liver, Fibromyalgia, Fibromyalgia, GERD (gastroesophageal reflux disease), Headache, History of degenerative disc disease, Hyperlipidemia, Hypertension, and Sleep apnea. She also  has a past surgical history that includes Abdominal surgery; Abdominal hysterectomy; Esophagogastroduodenoscopy (egd) with propofol (N/A, 02/20/2017); Colonoscopy with propofol (N/A, 02/20/2017); Upper esophageal endoscopic ultrasound (eus) (N/A, 04/03/2017); Diverticulitis; EUS (N/A, 01/08/2018); Cholecystectomy (N/A, 04/27/2018); Cataract extraction w/PHACO (Left, 09/07/2019); and Cataract extraction w/PHACO (Right, 09/28/2019). [name] a current medication list which includes the following prescription(s): accu-chek guide, albuterol, amlodipine, aripiprazole, aspirin, budesonide-formoterol, cetirizine, dexlansoprazole, ergocalciferol, famotidine, fexofenadine, hydroxyzine, ibuprofen, insulin regular, levemir flextouch, linaclotide, loperamide, losartan, metformin, mirtazapine, montelukast, polyethylene glycol, pregabalin, propranolol, rizatriptan, rosuvastatin, solifenacin, sucralfate, tolterodine, topiramate, trazodone, triamcinolone cream, venlafaxine, venlafaxine xr, budesonide-formoterol, dicyclomine, [START ON 03/16/2021] hydrocodone-acetaminophen, [START  ON 04/15/2021] hydrocodone-acetaminophen, [START ON 05/15/2021] hydrocodone-acetaminophen, hydroxyzine, alive once daily womens, ondansetron, testosterone cypionate, topiramate, and trazodone. She  reports that she has been smoking cigarettes. She has a 7.00 pack-year smoking history. She has quit using smokeless tobacco. She reports current alcohol use of about 1.0 standard drink per week. She reports that she does not currently use drugs. Ms. [name] allergic to tramadol, benadryl [diphenhydramine hcl], bextra [valdecoxib], invokana [canagliflozin], and sulfamethoxazole-trimethoprim.   HPI  Today, she  is being contacted for medication management.   Patient states that her knees are still doing well after her Monovisc injection done in September.  She finds it easier to bear weight and perform household chores.  Is dealing with constipation.  Is on Linzess.  Recommend she supplement with Dulcolax and MiraLAX if he goes more than 48 hours without a bowel movement.  Pharmacotherapy Assessment   Analgesic: Norco 7.5 mg TID PRN #90/month   Monitoring: [place] PMP: PDMP reviewed during this encounter.       Pharmacotherapy: No side-effects or adverse reactions reported. Compliance: No problems identified. Effectiveness: Clinically acceptable. Plan: Refer to "POC". UDS:  Summary  Date Value Ref Range Status  03/07/2020 Note  Final    Comment:    ==================================================================== [employer] Select 13 (MW) ==================================================================== Test                             Result       Flag       Units  Drug Present and Declared for Prescription Verification   Hydrocodone                    276          EXPECTED   ng/mg creat   Hydromorphone                  159          EXPECTED   ng/mg creat   Dihydrocodeine                 180          EXPECTED   ng/mg creat   Norhydrocodone                 523          EXPECTED   ng/mg  creat    Sources of hydrocodone include scheduled prescription medications.    Hydromorphone, dihydrocodeine and norhydrocodone are expected    metabolites of hydrocodone. Hydromorphone and dihydrocodeine are    also available as scheduled prescription medications.  ==================================================================== Test                      Result    Flag   Units      Ref Range   Creatinine              74               mg/dL      >=20 ==================================================================== Declared Medications:  The flagging and interpretation on this report are based on the  following declared medications.  Unexpected results may arise from  inaccuracies in the declared medications.   **Note: The testing scope of this panel includes these medications:   Hydrocodone (Norco)   **Note: The testing scope of this panel does not include the  following reported medications:   Acetaminophen (Norco)  Albuterol (Ventolin HFA)  Aspirin  Budesonide (Symbicort)  Dexlansoprazole  Famotidine (Pepcid)  Fesoterodine (Toviaz)  Fexofenadine (Allegra)  Fluticasone (Flonase)  Formoterol (Symbicort)  Insulin (Levemir)  Iron  Linaclotide (Linzess)  Losartan (Cozaar)  Magnesium  Metformin  Mirtazapine (Remeron)  Multivitamin  Ondansetron (Zofran)  Polyethylene Glycol (MiraLAX)  Pregabalin (Lyrica)  Rizatriptan (Maxalt)  Rosuvastatin (Crestor)  Solifenacin (Vesicare)  Topiramate (Topamax)  Trazodone (Desyrel)  Venlafaxine (Effexor) ==================================================================== For clinical consultation, please call [phone]. ====================================================================  Laboratory Chemistry Profile   Renal Lab Results  Component Value Date   BUN 7 01/16/2021   CREATININE 0.54 01/16/2021   BCR 14 11/27/2017   GFRAA >60 08/09/2019   GFRNONAA >60 01/16/2021    Hepatic Lab Results   Component Value Date   AST 55 (H) 12/25/2020   ALT 66 (H) 12/25/2020   ALBUMIN 4.0 12/25/2020   ALKPHOS 130 (H) 12/25/2020   LIPASE 40 12/25/2020    Electrolytes Lab Results  Component Value Date   NA 136 01/16/2021   K 4.1 01/16/2021   CL 103 01/16/2021   CALCIUM 9.5 01/16/2021   MG 1.8 11/27/2017    Bone No results found for: VD25OH, VD125OH2TOT, YF7494WH6, PR9163WG6, 25OHVITD1, 25OHVITD2, 25OHVITD3, TESTOFREE, TESTOSTERONE  Inflammation (CRP: Acute Phase) (ESR: Chronic Phase) Lab Results  Component Value Date   CRP <1 11/27/2017   ESRSEDRATE 17 11/27/2017   LATICACIDVEN 1.6 10/14/2016         Note: Above Lab results reviewed.  Imaging  DG Chest 2 View CLINICAL DATA:  Chest pain. Additional provided: Chest pain radiating to left arm, nausea.  EXAM: CHEST - 2 VIEW  COMPARISON:  Prior chest radiographs 05/16/2015 and earlier.  FINDINGS: Heart size within normal limits. Aortic atherosclerosis. Redemonstrated scarring within the right lower lung field. No appreciable airspace consolidation or pulmonary edema. No evidence of pleural effusion or pneumothorax. No acute bony abnormality identified. Mild thoracic dextrocurvature. Surgical clips within the right upper quadrant of the abdomen.  IMPRESSION: No evidence of acute cardiopulmonary abnormality.  Redemonstrated scarring within the right lower lung field.  No appreciable airspace consolidation or pulmonary edema.  Aortic Atherosclerosis (ICD10-I70.0).  Thoracic dextrocurvature.  Electronically Signed   By: Kellie Simmering D.O.   On: 01/16/2021 15:06  Assessment  The primary encounter diagnosis was Primary osteoarthritis of right knee. Diagnoses of Arthritis of left knee, Chronic myofascial pain, Chronic, continuous use of opioids, Lumbar radiculopathy, Fibromyalgia, and Chronic pain syndrome were also pertinent to this visit.  Plan of Care   1.  Good relief after Monovisc injection for bilateral knee  pain.  Is endorsing analgesic and functional benefit.  Repeat as needed.  2.  Recommend supplementation with Dulcolax and/or MiraLAX for constipation.  Continue Linzess as prescribed  3.  Continue multimodal analgesics with Lyrica 100 mg twice daily, Topamax.   Pharmacotherapy (Medications Ordered): Meds ordered this encounter  Medications   HYDROcodone-acetaminophen (NORCO) 7.5-325 MG tablet    Sig: Take 1 tablet by mouth every 8 (eight) hours as needed for severe pain. Must last 30 days.    Dispense:  90 tablet    Refill:  0    Chronic Pain. (STOP Act - Not applicable). Fill one day early if closed on scheduled refill date.   HYDROcodone-acetaminophen (NORCO) 7.5-325 MG tablet    Sig: Take 1 tablet by mouth every 8 (eight) hours as needed for severe pain. Must last 30 days.    Dispense:  90 tablet    Refill:  0    Chronic Pain. (STOP Act - Not applicable). Fill one day early if closed on scheduled refill date.   HYDROcodone-acetaminophen (NORCO) 7.5-325 MG tablet    Sig: Take 1 tablet by mouth every 8 (eight) hours as needed for severe pain. Must last 30 days.    Dispense:  90 tablet    Refill:  0    Chronic Pain. (STOP Act - Not applicable). Fill one day early if closed on scheduled refill date.  Follow-up plan:   Return in about 14 weeks (around 06/12/2021) for Medication Management, in person.    Recent Visits Date Type Provider Dept  01/15/21 Office Visit Gillis Santa, MD [facility]  12/11/20 Procedure visit Gillis Santa, MD [facility]  Showing recent visits within past 90 days and meeting all other requirements Today's Visits Date Type Provider Dept  03/06/21 Office Visit Gillis Santa, MD [facility]  Showing today's visits and meeting all other requirements Future Appointments Date Type Provider Dept  05/29/21 Appointment Gillis Santa, MD [facility]  Showing future appointments within next 90 days and meeting all other  requirements I discussed the assessment and treatment plan with the patient. The patient was provided an opportunity to ask questions and all were answered. The patient agreed with the plan and demonstrated an understanding of the instructions.  Patient advised to call back or seek an in-person evaluation if the symptoms or condition worsens.  Duration of encounter: 85mnutes.  Note by: BGillis Santa MD Date: 03/06/2021; Time: 9:56 AM

## 2021-03-06 NOTE — Progress Notes (Signed)
Nursing Pain Medication Assessment:  Safety precautions to be maintained throughout the outpatient stay will include: orient to surroundings, keep bed in low position, maintain call bell within reach at all times, provide assistance with transfer out of bed and ambulation.  Medication Inspection Compliance: Pill count conducted under aseptic conditions, in front of the patient. Neither the pills nor the bottle was removed from the patient's sight at any time. Once count was completed pills were immediately returned to the patient in their original bottle.  Medication: Hydrocodone/APAP Pill/Patch Count:  29 of 90 pills remain Pill/Patch Appearance: Markings consistent with prescribed medication Bottle Appearance: Standard pharmacy container. Clearly labeled. Filled Date: 75 / 23 / 2022 Last Medication intake:  Today

## 2021-03-20 ENCOUNTER — Telehealth: Payer: Self-pay | Admitting: Student in an Organized Health Care Education/Training Program

## 2021-03-20 NOTE — Telephone Encounter (Signed)
Patient needs PA for her pain meds. Been out since 23rd. Please advise patient when done

## 2021-03-27 ENCOUNTER — Telehealth: Payer: Self-pay | Admitting: Student in an Organized Health Care Education/Training Program

## 2021-04-04 ENCOUNTER — Emergency Department: Payer: Medicaid Other

## 2021-04-04 ENCOUNTER — Other Ambulatory Visit: Payer: Self-pay

## 2021-04-04 ENCOUNTER — Inpatient Hospital Stay
Admission: EM | Admit: 2021-04-04 | Discharge: 2021-04-07 | DRG: 917 | Disposition: A | Discharge status: Home Health | Payer: Medicaid Other | Attending: Internal Medicine | Admitting: Internal Medicine

## 2021-04-04 DIAGNOSIS — K219 Gastro-esophageal reflux disease without esophagitis: Secondary | ICD-10-CM | POA: Diagnosis present

## 2021-04-04 DIAGNOSIS — Z7951 Long term (current) use of inhaled steroids: Secondary | ICD-10-CM

## 2021-04-04 DIAGNOSIS — Z20822 Contact with and (suspected) exposure to covid-19: Secondary | ICD-10-CM | POA: Diagnosis present

## 2021-04-04 DIAGNOSIS — R4182 Altered mental status, unspecified: Secondary | ICD-10-CM

## 2021-04-04 DIAGNOSIS — E1165 Type 2 diabetes mellitus with hyperglycemia: Secondary | ICD-10-CM | POA: Diagnosis present

## 2021-04-04 DIAGNOSIS — N3281 Overactive bladder: Secondary | ICD-10-CM | POA: Diagnosis present

## 2021-04-04 DIAGNOSIS — I959 Hypotension, unspecified: Secondary | ICD-10-CM | POA: Diagnosis present

## 2021-04-04 DIAGNOSIS — I10 Essential (primary) hypertension: Secondary | ICD-10-CM | POA: Diagnosis present

## 2021-04-04 DIAGNOSIS — N179 Acute kidney failure, unspecified: Secondary | ICD-10-CM

## 2021-04-04 DIAGNOSIS — Z79899 Other long term (current) drug therapy: Secondary | ICD-10-CM

## 2021-04-04 DIAGNOSIS — T50901A Poisoning by unspecified drugs, medicaments and biological substances, accidental (unintentional), initial encounter: Secondary | ICD-10-CM

## 2021-04-04 DIAGNOSIS — R739 Hyperglycemia, unspecified: Secondary | ICD-10-CM

## 2021-04-04 DIAGNOSIS — M797 Fibromyalgia: Secondary | ICD-10-CM | POA: Diagnosis present

## 2021-04-04 DIAGNOSIS — K760 Fatty (change of) liver, not elsewhere classified: Secondary | ICD-10-CM | POA: Diagnosis present

## 2021-04-04 DIAGNOSIS — F419 Anxiety disorder, unspecified: Secondary | ICD-10-CM | POA: Diagnosis present

## 2021-04-04 DIAGNOSIS — G894 Chronic pain syndrome: Secondary | ICD-10-CM | POA: Diagnosis present

## 2021-04-04 DIAGNOSIS — E11649 Type 2 diabetes mellitus with hypoglycemia without coma: Secondary | ICD-10-CM | POA: Diagnosis not present

## 2021-04-04 DIAGNOSIS — E871 Hypo-osmolality and hyponatremia: Secondary | ICD-10-CM | POA: Diagnosis present

## 2021-04-04 DIAGNOSIS — F39 Unspecified mood [affective] disorder: Secondary | ICD-10-CM | POA: Diagnosis present

## 2021-04-04 DIAGNOSIS — Z833 Family history of diabetes mellitus: Secondary | ICD-10-CM

## 2021-04-04 DIAGNOSIS — Z90710 Acquired absence of both cervix and uterus: Secondary | ICD-10-CM

## 2021-04-04 DIAGNOSIS — G928 Other toxic encephalopathy: Secondary | ICD-10-CM | POA: Diagnosis present

## 2021-04-04 DIAGNOSIS — T40601S Poisoning by unspecified narcotics, accidental (unintentional), sequela: Secondary | ICD-10-CM

## 2021-04-04 DIAGNOSIS — Z7982 Long term (current) use of aspirin: Secondary | ICD-10-CM

## 2021-04-04 DIAGNOSIS — E861 Hypovolemia: Secondary | ICD-10-CM | POA: Diagnosis present

## 2021-04-04 DIAGNOSIS — Z8249 Family history of ischemic heart disease and other diseases of the circulatory system: Secondary | ICD-10-CM

## 2021-04-04 DIAGNOSIS — J449 Chronic obstructive pulmonary disease, unspecified: Secondary | ICD-10-CM | POA: Diagnosis present

## 2021-04-04 DIAGNOSIS — Z794 Long term (current) use of insulin: Secondary | ICD-10-CM

## 2021-04-04 DIAGNOSIS — T402X1A Poisoning by other opioids, accidental (unintentional), initial encounter: Principal | ICD-10-CM | POA: Diagnosis present

## 2021-04-04 DIAGNOSIS — D72828 Other elevated white blood cell count: Secondary | ICD-10-CM | POA: Diagnosis present

## 2021-04-04 DIAGNOSIS — E119 Type 2 diabetes mellitus without complications: Secondary | ICD-10-CM

## 2021-04-04 DIAGNOSIS — G4733 Obstructive sleep apnea (adult) (pediatric): Secondary | ICD-10-CM | POA: Diagnosis present

## 2021-04-04 DIAGNOSIS — E782 Mixed hyperlipidemia: Secondary | ICD-10-CM | POA: Diagnosis present

## 2021-04-04 DIAGNOSIS — F17210 Nicotine dependence, cigarettes, uncomplicated: Secondary | ICD-10-CM | POA: Diagnosis present

## 2021-04-04 DIAGNOSIS — Z7984 Long term (current) use of oral hypoglycemic drugs: Secondary | ICD-10-CM

## 2021-04-04 LAB — CBG MONITORING, ED: Glucose-Capillary: 392 mg/dL — ABNORMAL HIGH (ref 70–99)

## 2021-04-04 MED ORDER — NALOXONE HCL 2 MG/2ML IJ SOSY
0.4000 mg | PREFILLED_SYRINGE | Freq: Once | INTRAMUSCULAR | Status: AC
  Administered 2021-04-05: 0.4 mg via INTRAVENOUS
  Filled 2021-04-04: qty 2

## 2021-04-04 NOTE — ED Provider Notes (Signed)
[facility] Provider Note    Event Date/Time   First MD Initiated Contact with Patient 04/04/21 2305     (approximate)   History   Unresponsive   HPI  Level V caveat: Limited by decreased LOC History obtained via patient's family  [name] is a 61 y.o. female brought to the ED via EMS from home with a chief complaint of decreased LOC.  Patient with a medical history of asthma, diabetes, fibromyalgia.  Daughter states she takes trazodone and oxycodone.  Daughter last saw her via FaceTime around 7 PM Tuesday, 04/03/2021.  Daughter received a call from patient's neighbor around 10 PM 04/04/2021 stating that the neighbor had not seen the patient all day and when the neighbor checked on the patient she did not come to the door.  Eventually patient did come to the door to let the neighbor in.  Neighbor called EMS due to patient with altered mentation.  States her house was a mess and looks like things had gotten knocked over.  Daughter states patient has a chronic cough but patient did not indicate she was feeling poorly on Tuesday night.  Patient arrives to the ED somnolent but briefly answer some questions when aroused.     Past Medical History   Past Medical History:  Diagnosis Date   Asthma    Asthma    BPPV (benign paroxysmal positional vertigo)    none recently   Cardiac murmur, unspecified 08/28/2016   CPAP (continuous positive airway pressure) dependence    Degenerative disc disease, lumbar    Depression    Diabetes mellitus    Diverticulitis 10/2016   Fatty liver    Fibromyalgia    Fibromyalgia    GERD (gastroesophageal reflux disease)    Headache    migraines.  None since starting topamax   History of degenerative disc disease    Hyperlipidemia    Hypertension    Sleep apnea    CPAP     Active Problem List   Patient Active Problem List   Diagnosis Date Noted   Opiate overdose, accidental or unintentional, sequela  04/05/2021   Arthritis of left knee 07/05/2020   OSA on CPAP 05/15/2020   CPAP use counseling 05/15/2020   Primary osteoarthritis of right knee 04/26/2020   Anxiety 08/29/2019   OSA (obstructive sleep apnea) 09/05/2018   Pain in both hands 03/11/2018   Calculus of gallbladder without cholecystitis without obstruction 03/04/2018   Chronic constipation 03/04/2018   Pelvic pain in female 03/04/2018   Chronic constipation 03/04/2018   Pharmacologic therapy 11/27/2017   Disorder of skeletal system 11/27/2017   Problems influencing health status 11/27/2017   Aortic atherosclerosis (HCC) 11/26/2017   Smoker 09/01/2017   Lumbar radiculopathy 05/29/2017   Chronic bilateral low back pain with bilateral sciatica 05/29/2017   Chronic pain syndrome 05/29/2017   Chronic, continuous use of opioids 05/29/2017   Chronic myofascial pain 05/29/2017   Primary osteoarthritis of both hips 03/11/2017   Fatty liver disease, nonalcoholic 10/25/2016   DDD (degenerative disc disease), lumbar 10/23/2016   Diverticulitis 10/14/2016   Cardiac murmur 08/28/2016   Hyperlipidemia, mixed 05/29/2016   Type 2 diabetes mellitus without complications (HCC) 05/29/2016   COPD (chronic obstructive pulmonary disease) (HCC) 01/02/2016   Essential hypertension 05/15/2015     Past Surgical History   Past Surgical History:  Procedure Laterality Date   ABDOMINAL HYSTERECTOMY     ABDOMINAL SURGERY     CATARACT EXTRACTION W/PHACO Left  09/07/2019   Procedure: CATARACT EXTRACTION PHACO AND INTRAOCULAR LENS PLACEMENT (IOC) LEFT DIABETIC;  Surgeon: Galen Manila, MD;  Location: [facility];  Service: Ophthalmology;  Laterality: Left;  4.01 0:43.8   CATARACT EXTRACTION W/PHACO Right 09/28/2019   Procedure: CATARACT EXTRACTION PHACO AND INTRAOCULAR LENS PLACEMENT (IOC) RIGHT DIABETIC 4.16  00:43.3;  Surgeon: Galen Manila, MD;  Location: [facility];  Service: Ophthalmology;  Laterality: Right;  Diabetic -  insulin and oral meds   CHOLECYSTECTOMY N/A 04/27/2018   Procedure: LAPAROSCOPIC CHOLECYSTECTOMY;  Surgeon: Carolan Shiver, MD;  Location: [facility] ORS;  Service: General;  Laterality: N/A;   COLONOSCOPY WITH PROPOFOL N/A 02/20/2017   Procedure: COLONOSCOPY WITH PROPOFOL;  Surgeon: Christena Deem, MD;  Location: [facility];  Service: Endoscopy;  Laterality: N/A;   Diverticulitis     ESOPHAGOGASTRODUODENOSCOPY (EGD) WITH PROPOFOL N/A 02/20/2017   Procedure: ESOPHAGOGASTRODUODENOSCOPY (EGD) WITH PROPOFOL;  Surgeon: Christena Deem, MD;  Location: [facility] ENDOSCOPY;  Service: Endoscopy;  Laterality: N/A;   EUS N/A 01/08/2018   Procedure: UPPER ENDOSCOPIC ULTRASOUND (EUS) RADIAL;  Surgeon: Bearl Mulberry, MD;  Location: [facility];  Service: Gastroenterology;  Laterality: N/A;   UPPER ESOPHAGEAL ENDOSCOPIC ULTRASOUND (EUS) N/A 04/03/2017   Procedure: UPPER ESOPHAGEAL ENDOSCOPIC ULTRASOUND (EUS);  Surgeon: Bearl Mulberry, MD;  Location: [facility] END[facility];  Service: Gastroenterology;  Laterality: N/A;     Home Medications   Prior to Admission medications   Medication Sig Start Date End Date Taking? Authorizing Provider  ACCU-CHEK GUIDE test strip USE TID 12/30/17   [provider]  albuterol (PROVENTIL HFA;VENTOLIN HFA) 108 (90 Base) MCG/ACT inhaler Inhale 1 puff into the lungs 2 (two) times daily as needed.    [provider]  amLODipine (NORVASC) 10 MG tablet Take 1 tablet by mouth daily. 03/30/20   [provider]  ARIPiprazole (ABILIFY) 5 MG tablet Take 5 mg by mouth daily.    [provider]  aspirin 81 MG tablet Take 81 mg by mouth daily.     [provider]  budesonide-formoterol (SYMBICORT) 160-4.5 MCG/ACT inhaler Inhale 2 puffs into the lungs 2 (two) times daily.    [provider]  budesonide-formoterol (SYMBICORT) 160-4.5 MCG/ACT inhaler Inhale 2 puffs into the lungs 2 (two) times daily. Patient not taking:  Reported on 03/06/2021 05/05/20   [provider]  cetirizine (ZYRTEC) 10 MG tablet Take by mouth. 04/28/20   [provider]  Dexlansoprazole 30 MG capsule Take 30 mg by mouth daily.    [provider]  dicyclomine (BENTYL) 10 MG capsule Take 1 capsule (10 mg total) by mouth 4 (four) times daily for 14 days. 12/25/20 01/08/21  Cuthriell, Delorise Royals, PA-C  ergocalciferol (VITAMIN D2) 1.25 MG (50000 UT) capsule TAKE 1 CAPSULE BY MOUTH 1 TIME A WEEK 01/23/21   [provider]  famotidine (PEPCID) 40 MG tablet Take 40 mg by mouth at bedtime.    [provider]  fexofenadine (ALLEGRA) 180 MG tablet Take 180 mg by mouth daily. 08/29/18   [provider]  HYDROcodone-acetaminophen (NORCO) 7.5-325 MG tablet Take 1 tablet by mouth every 8 (eight) hours as needed for severe pain. Must last 30 days. 03/16/21 04/15/21  Edward Jolly, MD  HYDROcodone-acetaminophen (NORCO) 7.5-325 MG tablet Take 1 tablet by mouth every 8 (eight) hours as needed for severe pain. Must last 30 days. 04/15/21 05/15/21  Edward Jolly, MD  HYDROcodone-acetaminophen (NORCO) 7.5-325 MG tablet Take 1 tablet by mouth every 8 (eight) hours as needed for  severe pain. Must last 30 days. 05/15/21 06/14/21  Edward Jolly, MD  hydrOXYzine (ATARAX/VISTARIL) 50 MG tablet Take 50 mg by mouth 2 (two) times daily. 05/03/20   [provider]  hydrOXYzine (ATARAX/VISTARIL) 50 MG tablet Take 50 mg by mouth in the morning and at bedtime. Patient not taking: Reported on 03/06/2021    [provider]  ibuprofen (ADVIL) 800 MG tablet Take 800 mg by mouth every 6 (six) hours as needed. 12/08/20   [provider]  insulin regular (NOVOLIN R) 100 units/mL injection Inject 12 Units into the skin 3 (three) times daily before meals.    [provider]  LEVEMIR FLEXTOUCH 100 UNIT/ML Pen Inject 60 Units into the skin daily.  01/07/18   [provider]  linaclotide (LINZESS) 290 MCG  CAPS capsule Take 290 mcg by mouth daily before breakfast.    [provider]  loperamide (IMODIUM A-D) 2 MG tablet Take 1 tablet (2 mg total) by mouth 4 (four) times daily as needed for diarrhea or loose stools. 12/25/20   Cuthriell, Delorise Royals, PA-C  losartan (COZAAR) 100 MG tablet Take 100 mg by mouth daily. 09/10/18   [provider]  metFORMIN (GLUCOPHAGE) 1000 MG tablet Take 1,000 mg by mouth 2 (two) times daily.  06/07/16   [provider]  mirtazapine (REMERON) 45 MG tablet Take 45 mg by mouth daily. 04/14/18   [provider]  montelukast (SINGULAIR) 10 MG tablet Take 1 tablet by mouth daily. 09/14/19   [provider]  Multiple Vitamins-Minerals (ALIVE ONCE DAILY WOMENS) TABS Take 1 tablet by mouth daily.  Patient not taking: Reported on 12/05/2020    [provider]  ondansetron (ZOFRAN) 4 MG tablet Take 4 mg by mouth every 8 (eight) hours as needed for nausea or vomiting.  Patient not taking: Reported on 03/06/2021 12/30/17   [provider]  polyethylene glycol (MIRALAX / GLYCOLAX) packet Take 17 g by mouth daily.    [provider]  pregabalin (LYRICA) 100 MG capsule Take 1 capsule (100 mg total) by mouth 2 (two) times daily. 12/05/20   Edward Jolly, MD  propranolol (INDERAL) 40 MG tablet Take 40 mg by mouth 2 (two) times daily.    [provider]  rizatriptan (MAXALT) 10 MG tablet Take 10 mg by mouth as needed for migraine. May repeat in 2 hours if needed    [provider]  rosuvastatin (CRESTOR) 20 MG tablet Take 20 mg by mouth daily.    [provider]  solifenacin (VESICARE) 10 MG tablet Take 10 mg by mouth daily. 09/28/18   [provider]  sucralfate (CARAFATE) 1 g tablet TAKE 1 TABLET BY MOUTH THREE TIMES DAILY BEFORE MEALS. DISSOLVE TABLET IN 1 TABLESPOON OF WATER TO CREATE A SLURRY 04/27/20   [provider]  testosterone cypionate (DEPOTESTOSTERONE CYPIONATE) 200 MG/ML  injection Inject into the muscle. 12/20/20 01/19/21  [provider]  tolterodine (DETROL) 2 MG tablet Take 2 mg by mouth daily.    [provider]  topiramate (TOPAMAX) 25 MG tablet Take 25 mg by mouth 2 (two) times daily.    [provider]  topiramate (TOPAMAX) 25 MG tablet Take 1 tablet by mouth 2 (two) times daily. Patient not taking: Reported on 08/15/2020 04/28/20   [provider]  traZODone (DESYREL) 100 MG tablet Take 100 mg by mouth at bedtime.  Patient not taking: Reported on 08/15/2020    [provider]  traZODone (DESYREL) 150 MG  tablet Take 150 mg by mouth at bedtime. 04/27/20   [provider]  triamcinolone (KENALOG) 0.1 % Apply topically. 05/03/20 05/03/21  [provider]  venlafaxine (EFFEXOR) 37.5 MG tablet Take 37.5 mg by mouth daily.    [provider]  venlafaxine XR (EFFEXOR-XR) 150 MG 24 hr capsule Take 150 mg by mouth daily. 07/15/19   [provider]     Allergies  Tramadol, Benadryl [diphenhydramine hcl], Bextra [valdecoxib], Invokana [canagliflozin], and Sulfamethoxazole-trimethoprim   Family History   Family History  Problem Relation Age of Onset   Diabetes Mother    Hypertension Mother      Physical Exam  Triage Vital Signs: ED Triage Vitals  Enc Vitals Group     BP      Pulse      Resp      Temp      Temp src      SpO2      Weight      Height      Head Circumference      Peak Flow      Pain Score      Pain Loc      Pain Edu?      Excl. in GC?     Updated Vital Signs: BP 130/77    Pulse 90    Temp 97.9 F (36.6 C) (Oral)    Resp 14    Wt 55 kg    LMP  (LMP Unknown) Comment: "many years"   SpO2 98%    BMI 24.49 kg/m    General: Somnolent, no distress.  CV:  Good peripheral perfusion.  Resp:  Normal effort.  Shallow inspiration. Abd:  No distention.  Nontender. Other:  Somnolent.  Briefly arouses to voice. MAEx4.  Pinpoint pupils   ED Results / Procedures /  Treatments  Labs (all labs ordered are listed, but only abnormal results are displayed) Labs Reviewed  CBC WITH DIFFERENTIAL/PLATELET - Abnormal; Notable for the following components:      Result Value   WBC 11.7 (*)    HCT 46.2 (*)    All other components within normal limits  COMPREHENSIVE METABOLIC PANEL - Abnormal; Notable for the following components:   Sodium 133 (*)    Glucose, Bld 422 (*)    BUN 24 (*)    Creatinine, Ser 1.72 (*)    ALT 45 (*)    GFR, Estimated 34 (*)    All other components within normal limits  URINALYSIS, ROUTINE W REFLEX MICROSCOPIC - Abnormal; Notable for the following components:   APPearance HAZY (*)    Glucose, UA >=500 (*)    Protein, ur 30 (*)    All other components within normal limits  AMMONIA - Abnormal; Notable for the following components:   Ammonia 46 (*)    All other components within normal limits  ACETAMINOPHEN LEVEL - Abnormal; Notable for the following components:   Acetaminophen (Tylenol), Serum <10 (*)    All other components within normal limits  SALICYLATE LEVEL - Abnormal; Notable for the following components:   Salicylate Lvl <7.0 (*)    All other components within normal limits  CBG MONITORING, ED - Abnormal; Notable for the following components:   Glucose-Capillary 392 (*)    All other components within normal limits  RESP PANEL BY RT-PCR (FLU A&B, COVID) ARPGX2  CULTURE, BLOOD (ROUTINE X 2)  CULTURE, BLOOD (ROUTINE X 2) W REFLEX TO ID PANEL  URINE DRUG SCREEN, QUALITATIVE ([facility] ONLY)  LACTIC ACID, PLASMA  LACTIC ACID, PLASMA  ETHANOL  URINALYSIS, MICROSCOPIC (REFLEX)  BLOOD GAS, ARTERIAL  HIV ANTIBODY (ROUTINE TESTING W REFLEX)  BASIC METABOLIC PANEL  CBC  TROPONIN I (HIGH SENSITIVITY)  TROPONIN I (HIGH SENSITIVITY)     EKG  ED ECG REPORT I, Weronika Birch J, the attending physician, personally viewed and interpreted this ECG.   Date: 04/05/2021  EKG Time: 2356  Rate: 75  Rhythm: normal sinus rhythm  Axis:  Normal  Intervals:none  ST&T Change: Nonspecific    RADIOLOGY I have personally reviewed patient's CT head and portable chest x-ray, as well as the radiology interpretations:  CT head: No ICH  Chest x-ray: Shallow inspiration  Official radiology report(s): DG Chest Port 1 View  Result Date: 04/04/2021 CLINICAL DATA:  Altered mental status EXAM: PORTABLE CHEST 1 VIEW COMPARISON:  01/16/2021 FINDINGS: Low lung volumes augmenting the cardiomediastinal silhouette. Prominent central pulmonary vessels. Subsegmental atelectasis or scarring in the right lower lung. Aortic atherosclerosis. No pneumothorax. IMPRESSION: Low lung volumes with subsegmental atelectasis and or scarring in the lower lungs. Slightly enlarged cardiomediastinal silhouette likely augmented by low lung volume and portable technique. Electronically Signed   By: Jasmine Pang M.D.   On: 04/04/2021 23:44   CT HEAD CODE STROKE WO CONTRAST  Result Date: 04/04/2021 CLINICAL DATA:  Code stroke. Initial evaluation for neuro deficit, stroke suspected. EXAM: CT HEAD WITHOUT CONTRAST TECHNIQUE: Contiguous axial images were obtained from the base of the skull through the vertex without intravenous contrast. RADIATION DOSE REDUCTION: This exam was performed according to the departmental dose-optimization program which includes automated exposure control, adjustment of the mA and/or kV according to patient size and/or use of iterative reconstruction technique. COMPARISON:  Prior CT from 05/05/2013. FINDINGS: Brain: Cerebral volume within normal limits. Scattered patchy hypodensity involving the supratentorial cerebral white matter, nonspecific, but most likely related chronic microvascular ischemic disease. No acute intracranial hemorrhage. No acute large vessel territory infarct. No mass lesion or midline shift. No hydrocephalus or extra-axial fluid collection. Vascular: No hyperdense vessel. Skull: Calvarium intact. Few scattered foci of soft  tissue emphysema present at the right temporal and infratemporal region, suspected be related IV access. Scalp soft tissues demonstrate no other acute finding. Sinuses/Orbits: Globes and orbital soft tissues demonstrate no acute finding. Visualized paranasal sinuses and mastoid air cells are clear. Other: None. ASPECTS [facility] Stroke Program Early CT Score) - Ganglionic level infarction (caudate, lentiform nuclei, internal capsule, insula, M1-M3 cortex): 7 - Supraganglionic infarction (M4-M6 cortex): 3 Total score (0-10 with 10 being normal): 10 IMPRESSION: 1. No acute intracranial abnormality. 2. ASPECTS is 10. 3. Patchy hypodensity involving the supratentorial cerebral white matter, nonspecific, but most likely related chronic microvascular ischemic disease. Results were called by telephone at the time of interpretation on 04/04/2021 at 11:43 pm to the emergency room physician, who verbally acknowledged these results. Electronically Signed   By: Rise Mu M.D.   On: 04/04/2021 23:45     PROCEDURES:  Critical Care performed: Yes, see critical care procedure note(s)  .1-3 Lead EKG Interpretation Performed by: Irean Hong, MD Authorized by: Irean Hong, MD     Interpretation: normal     ECG rate:  80   ECG rate assessment: normal     Rhythm: sinus rhythm     Ectopy: none     Conduction: normal   Comments:     Patient placed on cardiac monitor to evaluate for arrhythmias  CRITICAL CARE Performed by: Irean Hong   Total  critical care time: 60 minutes  Critical care time was exclusive of separately billable procedures and treating other patients.  Critical care was necessary to treat or prevent imminent or life-threatening deterioration.  Critical care was time spent personally by me on the following activities: development of treatment plan with patient and/or surrogate as well as nursing, discussions with consultants, evaluation of patient's response to treatment, examination  of patient, obtaining history from patient or surrogate, ordering and performing treatments and interventions, ordering and review of laboratory studies, ordering and review of radiographic studies, pulse oximetry and re-evaluation of patient's condition.    MEDICATIONS ORDERED IN ED: Medications  aspirin tablet 81 mg (has no administration in time range)  SUMAtriptan (IMITREX) tablet 50 mg (has no administration in time range)  amLODipine (NORVASC) tablet 10 mg (has no administration in time range)  propranolol (INDERAL) tablet 40 mg (has no administration in time range)  rosuvastatin (CRESTOR) tablet 20 mg (has no administration in time range)  ARIPiprazole (ABILIFY) tablet 5 mg (has no administration in time range)  hydrOXYzine (ATARAX) tablet 50 mg (has no administration in time range)  mirtazapine (REMERON) tablet 45 mg (has no administration in time range)  traZODone (DESYREL) tablet 150 mg (has no administration in time range)  venlafaxine XR (EFFEXOR-XR) 24 hr capsule 150 mg (has no administration in time range)  insulin detemir (LEVEMIR) FlexPen 60 Units (has no administration in time range)  pantoprazole (PROTONIX) EC tablet 40 mg (has no administration in time range)  dicyclomine (BENTYL) capsule 10 mg (has no administration in time range)  famotidine (PEPCID) tablet 40 mg (has no administration in time range)  linaclotide (LINZESS) capsule 290 mcg (has no administration in time range)  loperamide (IMODIUM A-D) tablet 2 mg (has no administration in time range)  polyethylene glycol (MIRALAX / GLYCOLAX) packet 17 g (has no administration in time range)  sucralfate (CARAFATE) tablet 1 g (has no administration in time range)  darifenacin (ENABLEX) 24 hr tablet 7.5 mg (has no administration in time range)  oxybutynin (DITROPAN-XL) 24 hr tablet 5 mg (has no administration in time range)  pregabalin (LYRICA) capsule 100 mg (has no administration in time range)  topiramate (TOPAMAX)  tablet 25 mg (has no administration in time range)  albuterol (VENTOLIN HFA) 108 (90 Base) MCG/ACT inhaler 1 puff (has no administration in time range)  mometasone-formoterol (DULERA) 200-5 MCG/ACT inhaler 2 puff (has no administration in time range)  montelukast (SINGULAIR) tablet 10 mg (has no administration in time range)  enoxaparin (LOVENOX) injection 30 mg (has no administration in time range)  0.9 %  sodium chloride infusion ( Intravenous New Bag/Given 04/05/21 0340)  acetaminophen (TYLENOL) tablet 650 mg (has no administration in time range)    Or  acetaminophen (TYLENOL) suppository 650 mg (has no administration in time range)  traZODone (DESYREL) tablet 25 mg (has no administration in time range)  magnesium hydroxide (MILK OF MAGNESIA) suspension 30 mL (has no administration in time range)  ondansetron (ZOFRAN) tablet 4 mg (has no administration in time range)    Or  ondansetron (ZOFRAN) injection 4 mg (has no administration in time range)  naloxone HCl (NARCAN) 4 mg in dextrose 5 % 250 mL infusion (has no administration in time range)  naloxone [facility](NARCAN) injection 0.4 mg (0.4 mg Intravenous Given 04/05/21 0000)  naloxone (NARCAN) injection 0.4 mg (0.4 mg Intravenous Given 04/05/21 0015)  naloxone (NARCAN) 0.4 MG/ML injection (0.4 mg  Given 04/05/21 0030)  0.9 %  sodium chloride infusion (0  mLs Intravenous Stopped 04/05/21 0045)     IMPRESSION / MDM / ASSESSMENT AND PLAN / ED COURSE  I reviewed the triage vital signs and the nursing notes.                             61 year old female presenting with altered mental status. Differential diagnosis includes, but is not limited to, alcohol, illicit or prescription medications, or other toxic ingestion; intracranial pathology such as stroke or intracerebral hemorrhage; fever or infectious causes including sepsis; hypoxemia and/or hypercarbia; uremia; trauma; endocrine related disorders such as diabetes, hypoglycemia, and thyroid-related  diseases; hypertensive encephalopathy; etc.   The patient is on the cardiac monitor to evaluate for evidence of arrhythmia and/or significant heart rate changes.  I have personally reviewed patient's chart and see a telephone communication with her OB/GYN on 03/28/2021 that indicates she has been on Diflucan for vaginal yeast infection.  Also reviewed her pain clinic office visit from 03/06/2021 which indicates she is on scheduled hydrocodone.  Given patient's altered mentation and EMS reports of facial droop, ED code stroke was activated.  Will obtain toxicological and sepsis work-up.  Consider Narcan once patient is back from CT scan.  Family is confident that patient did not take an intentional overdose.   Clinical Course as of 04/05/21 0345  Thu Apr 05, 2021  0009 Appreciate teleneurology input; patient is out of the window for tPA. [JS]  0011 No response to Narcan.  Patient obtunded, minimal gag reflex.  Will obtain ABG.  Discussed with family high likelihood of intubation given level of patient's obtundation.  Family states patient is FULL CODE. [JS]  0024 Patient had more response to second dose of Narcan.  We were preparing for intubation when patient became more responsive.  Opens eyes on commands and squeezes fingers on commands.  Trying to talk.  Given a third round of 0.4 mg Narcan with improvement.  We will hold intubation for now and placed on Narcan drip. [JS]  0041 Patient is now sitting up on her own, much more awake and carrying on a conversation. [JS]  0120 On Narcan drip, awake and talking to family.  Awaiting results of UA.  Will consult hospitalist services for evaluation and admission. [JS]    Clinical Course User Index [JS] Irean Hong, MD     FINAL CLINICAL IMPRESSION(S) / ED DIAGNOSES   Final diagnoses:  Altered mental status, unspecified altered mental status type  Hyperglycemia  Accidental overdose, initial encounter  AKI (acute kidney injury) (HCC)     Rx /  DC Orders   ED Discharge Orders     None        Note:  This document was prepared using Dragon voice recognition software and may include unintentional dictation errors.   Irean Hong, MD 04/05/21 [phone]

## 2021-04-04 NOTE — ED Triage Notes (Signed)
Pt presents to ER via ems from home.  Per ems, pt called neighbor around 2030 tonight and was sounding more altered than normal. Pt was speaking clearly on scene with ems and has become more slurred since coming to hospital.  Pt noted to be more somnolent, but will answer some questions when aroused. A&O x1.

## 2021-04-04 NOTE — ED Notes (Signed)
Daughter present in room. She stated that the patient's neighbor had called the daughter stating that she had talked to the patient on the phone and she was belligerent. Neighbor went over to check on her and could see patient moving in the chair. Neighbor called out to the patient asking her to come to the door. Patient would respond "what door?" Daughter states only one door in home. Eventually, patient did answer door letting the neighbor in. Patient went back to her chair. Daughter stated that it appeared that things were knocked over within the house. BS per ems is over 500. Hypotensive on scene. 500 ml bolus of NS given. Improved SBP of 114 on arrival to ER. Oxygen level was 87% on RA. Oxygen at 2 lpm per [place].   Pt is drowsy. Will open eyes to verbal but quickly drifts back to sleep. Does follow commands.

## 2021-04-05 DIAGNOSIS — E861 Hypovolemia: Secondary | ICD-10-CM | POA: Diagnosis present

## 2021-04-05 DIAGNOSIS — K760 Fatty (change of) liver, not elsewhere classified: Secondary | ICD-10-CM | POA: Diagnosis present

## 2021-04-05 DIAGNOSIS — T402X1A Poisoning by other opioids, accidental (unintentional), initial encounter: Secondary | ICD-10-CM | POA: Diagnosis present

## 2021-04-05 DIAGNOSIS — G894 Chronic pain syndrome: Secondary | ICD-10-CM | POA: Diagnosis present

## 2021-04-05 DIAGNOSIS — R4182 Altered mental status, unspecified: Secondary | ICD-10-CM | POA: Diagnosis present

## 2021-04-05 DIAGNOSIS — N3281 Overactive bladder: Secondary | ICD-10-CM | POA: Diagnosis present

## 2021-04-05 DIAGNOSIS — E1165 Type 2 diabetes mellitus with hyperglycemia: Secondary | ICD-10-CM

## 2021-04-05 DIAGNOSIS — J449 Chronic obstructive pulmonary disease, unspecified: Secondary | ICD-10-CM | POA: Diagnosis present

## 2021-04-05 DIAGNOSIS — E871 Hypo-osmolality and hyponatremia: Secondary | ICD-10-CM | POA: Diagnosis present

## 2021-04-05 DIAGNOSIS — T40601S Poisoning by unspecified narcotics, accidental (unintentional), sequela: Secondary | ICD-10-CM | POA: Diagnosis not present

## 2021-04-05 DIAGNOSIS — G928 Other toxic encephalopathy: Secondary | ICD-10-CM | POA: Diagnosis present

## 2021-04-05 DIAGNOSIS — Z90710 Acquired absence of both cervix and uterus: Secondary | ICD-10-CM | POA: Diagnosis not present

## 2021-04-05 DIAGNOSIS — E11649 Type 2 diabetes mellitus with hypoglycemia without coma: Secondary | ICD-10-CM | POA: Diagnosis not present

## 2021-04-05 DIAGNOSIS — F419 Anxiety disorder, unspecified: Secondary | ICD-10-CM | POA: Diagnosis present

## 2021-04-05 DIAGNOSIS — Z833 Family history of diabetes mellitus: Secondary | ICD-10-CM | POA: Diagnosis not present

## 2021-04-05 DIAGNOSIS — I10 Essential (primary) hypertension: Secondary | ICD-10-CM

## 2021-04-05 DIAGNOSIS — D72828 Other elevated white blood cell count: Secondary | ICD-10-CM | POA: Diagnosis present

## 2021-04-05 DIAGNOSIS — I959 Hypotension, unspecified: Secondary | ICD-10-CM | POA: Diagnosis present

## 2021-04-05 DIAGNOSIS — N179 Acute kidney failure, unspecified: Secondary | ICD-10-CM | POA: Diagnosis present

## 2021-04-05 DIAGNOSIS — Z8249 Family history of ischemic heart disease and other diseases of the circulatory system: Secondary | ICD-10-CM | POA: Diagnosis not present

## 2021-04-05 DIAGNOSIS — K219 Gastro-esophageal reflux disease without esophagitis: Secondary | ICD-10-CM | POA: Diagnosis present

## 2021-04-05 DIAGNOSIS — M797 Fibromyalgia: Secondary | ICD-10-CM | POA: Diagnosis present

## 2021-04-05 DIAGNOSIS — F39 Unspecified mood [affective] disorder: Secondary | ICD-10-CM | POA: Diagnosis present

## 2021-04-05 DIAGNOSIS — Z20822 Contact with and (suspected) exposure to covid-19: Secondary | ICD-10-CM | POA: Diagnosis present

## 2021-04-05 DIAGNOSIS — F17210 Nicotine dependence, cigarettes, uncomplicated: Secondary | ICD-10-CM | POA: Diagnosis present

## 2021-04-05 DIAGNOSIS — E782 Mixed hyperlipidemia: Secondary | ICD-10-CM | POA: Diagnosis present

## 2021-04-05 LAB — CBC WITH DIFFERENTIAL/PLATELET
Abs Immature Granulocytes: 0.06 10*3/uL (ref 0.00–0.07)
Basophils Absolute: 0.1 10*3/uL (ref 0.0–0.1)
Basophils Relative: 1 %
Eosinophils Absolute: 0.1 10*3/uL (ref 0.0–0.5)
Eosinophils Relative: 1 %
HCT: 46.2 % — ABNORMAL HIGH (ref 36.0–46.0)
Hemoglobin: 14.4 g/dL (ref 12.0–15.0)
Immature Granulocytes: 1 %
Lymphocytes Relative: 25 %
Lymphs Abs: 2.9 10*3/uL (ref 0.7–4.0)
MCH: 28.4 pg (ref 26.0–34.0)
MCHC: 31.2 g/dL (ref 30.0–36.0)
MCV: 91.1 fL (ref 80.0–100.0)
Monocytes Absolute: 1 10*3/uL (ref 0.1–1.0)
Monocytes Relative: 8 %
Neutro Abs: 7.6 10*3/uL (ref 1.7–7.7)
Neutrophils Relative %: 64 %
Platelets: 195 10*3/uL (ref 150–400)
RBC: 5.07 MIL/uL (ref 3.87–5.11)
RDW: 14.6 % (ref 11.5–15.5)
WBC: 11.7 10*3/uL — ABNORMAL HIGH (ref 4.0–10.5)
nRBC: 0.2 % (ref 0.0–0.2)

## 2021-04-05 LAB — CBC
HCT: 47.1 % — ABNORMAL HIGH (ref 36.0–46.0)
Hemoglobin: 14.6 g/dL (ref 12.0–15.0)
MCH: 28 pg (ref 26.0–34.0)
MCHC: 31 g/dL (ref 30.0–36.0)
MCV: 90.2 fL (ref 80.0–100.0)
Platelets: 173 10*3/uL (ref 150–400)
RBC: 5.22 MIL/uL — ABNORMAL HIGH (ref 3.87–5.11)
RDW: 14.4 % (ref 11.5–15.5)
WBC: 11.3 10*3/uL — ABNORMAL HIGH (ref 4.0–10.5)
nRBC: 0 % (ref 0.0–0.2)

## 2021-04-05 LAB — COMPREHENSIVE METABOLIC PANEL
ALT: 45 U/L — ABNORMAL HIGH (ref 0–44)
ALT: 58 U/L — ABNORMAL HIGH (ref 0–44)
AST: 22 U/L (ref 15–41)
AST: 67 U/L — ABNORMAL HIGH (ref 15–41)
Albumin: 2.9 g/dL — ABNORMAL LOW (ref 3.5–5.0)
Albumin: 3.7 g/dL (ref 3.5–5.0)
Alkaline Phosphatase: 103 U/L (ref 38–126)
Alkaline Phosphatase: 113 U/L (ref 38–126)
Anion gap: 5 (ref 5–15)
Anion gap: 7 (ref 5–15)
BUN: 15 mg/dL (ref 6–20)
BUN: 24 mg/dL — ABNORMAL HIGH (ref 6–20)
CO2: 23 mmol/L (ref 22–32)
CO2: 24 mmol/L (ref 22–32)
Calcium: 8.2 mg/dL — ABNORMAL LOW (ref 8.9–10.3)
Calcium: 8.9 mg/dL (ref 8.9–10.3)
Chloride: 102 mmol/L (ref 98–111)
Chloride: 109 mmol/L (ref 98–111)
Creatinine, Ser: 0.74 mg/dL (ref 0.44–1.00)
Creatinine, Ser: 1.72 mg/dL — ABNORMAL HIGH (ref 0.44–1.00)
GFR, Estimated: 34 mL/min — ABNORMAL LOW (ref >60)
GFR, Estimated: >60 mL/min (ref >60)
Glucose, Bld: 422 mg/dL — ABNORMAL HIGH (ref 70–99)
Glucose, Bld: 91 mg/dL (ref 70–99)
Potassium: 3.9 mmol/L (ref 3.5–5.1)
Potassium: 4.2 mmol/L (ref 3.5–5.1)
Sodium: 133 mmol/L — ABNORMAL LOW (ref 135–145)
Sodium: 137 mmol/L (ref 135–145)
Total Bilirubin: 0.4 mg/dL (ref 0.3–1.2)
Total Bilirubin: 0.7 mg/dL (ref 0.3–1.2)
Total Protein: 5.7 g/dL — ABNORMAL LOW (ref 6.5–8.1)
Total Protein: 6.5 g/dL (ref 6.5–8.1)

## 2021-04-05 LAB — URINE DRUG SCREEN, QUALITATIVE (ARMC ONLY)
Amphetamines, Ur Screen: NOT DETECTED
Barbiturates, Ur Screen: NOT DETECTED
Benzodiazepine, Ur Scrn: NOT DETECTED
Cannabinoid 50 Ng, Ur ~~LOC~~: NOT DETECTED
Cocaine Metabolite,Ur ~~LOC~~: NOT DETECTED
MDMA (Ecstasy)Ur Screen: NOT DETECTED
Methadone Scn, Ur: NOT DETECTED
Opiate, Ur Screen: NOT DETECTED
Phencyclidine (PCP) Ur S: NOT DETECTED
Tricyclic, Ur Screen: NOT DETECTED

## 2021-04-05 LAB — BASIC METABOLIC PANEL
Anion gap: 4 — ABNORMAL LOW (ref 5–15)
BUN: 16 mg/dL (ref 6–20)
CO2: 25 mmol/L (ref 22–32)
Calcium: 9 mg/dL (ref 8.9–10.3)
Chloride: 105 mmol/L (ref 98–111)
Creatinine, Ser: 0.89 mg/dL (ref 0.44–1.00)
GFR, Estimated: >60 mL/min (ref >60)
Glucose, Bld: 452 mg/dL — ABNORMAL HIGH (ref 70–99)
Potassium: 4.9 mmol/L (ref 3.5–5.1)
Sodium: 134 mmol/L — ABNORMAL LOW (ref 135–145)

## 2021-04-05 LAB — URINALYSIS, MICROSCOPIC (REFLEX): Bacteria, UA: NONE SEEN

## 2021-04-05 LAB — AMMONIA: Ammonia: 46 umol/L — ABNORMAL HIGH (ref 9–35)

## 2021-04-05 LAB — SALICYLATE LEVEL: Salicylate Lvl: <7 mg/dL — ABNORMAL LOW (ref 7.0–30.0)

## 2021-04-05 LAB — CBG MONITORING, ED
Glucose-Capillary: 450 mg/dL — ABNORMAL HIGH (ref 70–99)
Glucose-Capillary: 432 mg/dL — ABNORMAL HIGH (ref 70–99)
Glucose-Capillary: 436 mg/dL — ABNORMAL HIGH (ref 70–99)
Glucose-Capillary: 53 mg/dL — ABNORMAL LOW (ref 70–99)
Glucose-Capillary: 75 mg/dL (ref 70–99)
Glucose-Capillary: 86 mg/dL (ref 70–99)
Glucose-Capillary: 94 mg/dL (ref 70–99)
Glucose-Capillary: 118 mg/dL — ABNORMAL HIGH (ref 70–99)

## 2021-04-05 LAB — URINALYSIS, ROUTINE W REFLEX MICROSCOPIC
Bilirubin Urine: NEGATIVE
Glucose, UA: >=500 mg/dL — AB
Hgb urine dipstick: NEGATIVE
Ketones, ur: NEGATIVE mg/dL
Leukocytes,Ua: NEGATIVE
Nitrite: NEGATIVE
Protein, ur: 30 mg/dL — AB
Specific Gravity, Urine: 1.016 (ref 1.005–1.030)
pH: 5 (ref 5.0–8.0)

## 2021-04-05 LAB — TROPONIN I (HIGH SENSITIVITY)
Troponin I (High Sensitivity): 8 ng/L (ref <18)
Troponin I (High Sensitivity): 8 ng/L (ref <18)

## 2021-04-05 LAB — MAGNESIUM: Magnesium: 1.7 mg/dL (ref 1.7–2.4)

## 2021-04-05 LAB — LACTIC ACID, PLASMA
Lactic Acid, Venous: 1.3 mmol/L (ref 0.5–1.9)
Lactic Acid, Venous: 1.7 mmol/L (ref 0.5–1.9)

## 2021-04-05 LAB — RESP PANEL BY RT-PCR (FLU A&B, COVID) ARPGX2
Influenza A by PCR: NEGATIVE
Influenza B by PCR: NEGATIVE
SARS Coronavirus 2 by RT PCR: NEGATIVE

## 2021-04-05 LAB — HIV ANTIBODY (ROUTINE TESTING W REFLEX): HIV Screen 4th Generation wRfx: NONREACTIVE

## 2021-04-05 LAB — ACETAMINOPHEN LEVEL: Acetaminophen (Tylenol), Serum: <10 ug/mL — ABNORMAL LOW (ref 10–30)

## 2021-04-05 LAB — ETHANOL: Alcohol, Ethyl (B): <10 mg/dL (ref <10)

## 2021-04-05 MED ORDER — INSULIN ASPART 100 UNIT/ML IJ SOLN: 20.0000 [IU] | Freq: Three times a day (TID) | INTRAMUSCULAR | Status: DC

## 2021-04-05 MED ORDER — NALOXONE HCL 4 MG/10ML IJ SOLN
2.0000 mg/h | INTRAVENOUS | Status: DC
  Administered 2021-04-05 (×2): 2 mg/h via INTRAVENOUS
  Filled 2021-04-05 (×2): qty 10

## 2021-04-05 MED ORDER — LINACLOTIDE 290 MCG PO CAPS
290.0000 ug | ORAL_CAPSULE | Freq: Every day | ORAL | Status: DC
  Administered 2021-04-05 – 2021-04-07 (×3): 290 ug via ORAL
  Filled 2021-04-05 (×4): qty 1

## 2021-04-05 MED ORDER — TRAZODONE HCL 50 MG PO TABS
150.0000 mg | ORAL_TABLET | Freq: Every day | ORAL | Status: DC
  Administered 2021-04-05 – 2021-04-06 (×2): 150 mg via ORAL
  Filled 2021-04-05 (×2): qty 3

## 2021-04-05 MED ORDER — INSULIN DETEMIR 100 UNIT/ML ~~LOC~~ SOLN
60.0000 [IU] | Freq: Every day | SUBCUTANEOUS | Status: DC
  Administered 2021-04-05: 60 [IU] via SUBCUTANEOUS
  Filled 2021-04-05 (×2): qty 0.6

## 2021-04-05 MED ORDER — MONTELUKAST SODIUM 10 MG PO TABS
10.0000 mg | ORAL_TABLET | Freq: Every day | ORAL | Status: DC
  Administered 2021-04-05 – 2021-04-07 (×3): 10 mg via ORAL
  Filled 2021-04-05 (×3): qty 1

## 2021-04-05 MED ORDER — NALOXONE HCL 4 MG/10ML IJ SOLN
1.0000 mg/h | INTRAVENOUS | Status: DC
  Administered 2021-04-05: 1 mg/h via INTRAVENOUS
  Filled 2021-04-05: qty 10

## 2021-04-05 MED ORDER — TRAZODONE HCL 50 MG PO TABS: 25.0000 mg | ORAL_TABLET | Freq: Every evening | ORAL | Status: DC | PRN

## 2021-04-05 MED ORDER — INSULIN ASPART 100 UNIT/ML IJ SOLN
30.0000 [IU] | Freq: Once | INTRAMUSCULAR | Status: AC
  Administered 2021-04-05: 30 [IU] via SUBCUTANEOUS

## 2021-04-05 MED ORDER — NALOXONE HCL 2 MG/2ML IJ SOSY
0.4000 mg | PREFILLED_SYRINGE | Freq: Once | INTRAMUSCULAR | Status: AC
  Administered 2021-04-05: 0.4 mg via INTRAVENOUS

## 2021-04-05 MED ORDER — SUCCINYLCHOLINE CHLORIDE 200 MG/10ML IV SOSY
PREFILLED_SYRINGE | INTRAVENOUS | Status: AC
  Filled 2021-04-05: qty 10

## 2021-04-05 MED ORDER — ETOMIDATE 2 MG/ML IV SOLN
INTRAVENOUS | Status: AC
  Filled 2021-04-05: qty 10

## 2021-04-05 MED ORDER — ENOXAPARIN SODIUM 30 MG/0.3ML IJ SOSY
30.0000 mg | PREFILLED_SYRINGE | INTRAMUSCULAR | Status: DC
  Administered 2021-04-05 – 2021-04-07 (×3): 30 mg via SUBCUTANEOUS
  Filled 2021-04-05 (×3): qty 0.3

## 2021-04-05 MED ORDER — ROSUVASTATIN CALCIUM 10 MG PO TABS
20.0000 mg | ORAL_TABLET | Freq: Every day | ORAL | Status: DC
  Administered 2021-04-05 – 2021-04-07 (×3): 20 mg via ORAL
  Filled 2021-04-05: qty 1
  Filled 2021-04-05: qty 2
  Filled 2021-04-05: qty 1

## 2021-04-05 MED ORDER — SODIUM CHLORIDE 0.9 % IV SOLN
Freq: Once | INTRAVENOUS | Status: AC
Start: 2021-04-05 — End: 2021-04-05

## 2021-04-05 MED ORDER — MOMETASONE FURO-FORMOTEROL FUM 200-5 MCG/ACT IN AERO
2.0000 | INHALATION_SPRAY | Freq: Two times a day (BID) | RESPIRATORY_TRACT | Status: DC
  Administered 2021-04-05 – 2021-04-07 (×4): 2 via RESPIRATORY_TRACT
  Filled 2021-04-05 (×2): qty 8.8

## 2021-04-05 MED ORDER — SODIUM CHLORIDE 0.9 % IV SOLN: INTRAVENOUS | Status: DC

## 2021-04-05 MED ORDER — HYDROXYZINE HCL 25 MG PO TABS
50.0000 mg | ORAL_TABLET | Freq: Two times a day (BID) | ORAL | Status: DC
  Administered 2021-04-05: 50 mg via ORAL
  Filled 2021-04-05: qty 2

## 2021-04-05 MED ORDER — PROPRANOLOL HCL 20 MG PO TABS
40.0000 mg | ORAL_TABLET | Freq: Two times a day (BID) | ORAL | Status: DC
  Administered 2021-04-05: 40 mg via ORAL
  Filled 2021-04-05: qty 2

## 2021-04-05 MED ORDER — ASPIRIN EC 81 MG PO TBEC
81.0000 mg | DELAYED_RELEASE_TABLET | Freq: Every day | ORAL | Status: DC
  Administered 2021-04-05 – 2021-04-07 (×3): 81 mg via ORAL
  Filled 2021-04-05 (×3): qty 1

## 2021-04-05 MED ORDER — ONDANSETRON HCL 4 MG/2ML IJ SOLN: 4.0000 mg | Freq: Four times a day (QID) | INTRAMUSCULAR | Status: DC | PRN

## 2021-04-05 MED ORDER — VENLAFAXINE HCL ER 75 MG PO CP24
150.0000 mg | ORAL_CAPSULE | Freq: Every day | ORAL | Status: DC
  Administered 2021-04-05 – 2021-04-07 (×3): 150 mg via ORAL
  Filled 2021-04-05: qty 1
  Filled 2021-04-05: qty 2
  Filled 2021-04-05: qty 1

## 2021-04-05 MED ORDER — POLYETHYLENE GLYCOL 3350 17 G PO PACK
17.0000 g | PACK | Freq: Every day | ORAL | Status: DC
  Administered 2021-04-07: 17 g via ORAL
  Filled 2021-04-05: qty 1

## 2021-04-05 MED ORDER — ARIPIPRAZOLE 5 MG PO TABS
5.0000 mg | ORAL_TABLET | Freq: Every day | ORAL | Status: DC
  Administered 2021-04-05 – 2021-04-07 (×3): 5 mg via ORAL
  Filled 2021-04-05 (×3): qty 1

## 2021-04-05 MED ORDER — DARIFENACIN HYDROBROMIDE ER 7.5 MG PO TB24
7.5000 mg | ORAL_TABLET | Freq: Every day | ORAL | Status: DC
  Administered 2021-04-05 – 2021-04-07 (×3): 7.5 mg via ORAL
  Filled 2021-04-05 (×3): qty 1

## 2021-04-05 MED ORDER — LACTATED RINGERS IV BOLUS
500.0000 mL | Freq: Once | INTRAVENOUS | Status: AC
  Administered 2021-04-05: 500 mL via INTRAVENOUS

## 2021-04-05 MED ORDER — DICYCLOMINE HCL 10 MG PO CAPS
10.0000 mg | ORAL_CAPSULE | Freq: Four times a day (QID) | ORAL | Status: DC
  Administered 2021-04-05 – 2021-04-07 (×8): 10 mg via ORAL
  Filled 2021-04-05 (×13): qty 1

## 2021-04-05 MED ORDER — SUCRALFATE 1 G PO TABS
1.0000 g | ORAL_TABLET | Freq: Three times a day (TID) | ORAL | Status: DC
  Administered 2021-04-05 – 2021-04-07 (×9): 1 g via ORAL
  Filled 2021-04-05 (×9): qty 1

## 2021-04-05 MED ORDER — SODIUM CHLORIDE 0.9 % IV BOLUS
1000.0000 mL | Freq: Once | INTRAVENOUS | Status: AC
  Administered 2021-04-05: 1000 mL via INTRAVENOUS

## 2021-04-05 MED ORDER — LOPERAMIDE HCL 2 MG PO CAPS: 2.0000 mg | ORAL_CAPSULE | Freq: Four times a day (QID) | ORAL | Status: DC | PRN

## 2021-04-05 MED ORDER — PANTOPRAZOLE SODIUM 40 MG PO TBEC
40.0000 mg | DELAYED_RELEASE_TABLET | Freq: Every day | ORAL | Status: DC
  Administered 2021-04-05 – 2021-04-07 (×3): 40 mg via ORAL
  Filled 2021-04-05 (×3): qty 1

## 2021-04-05 MED ORDER — NALOXONE HCL 0.4 MG/ML IJ SOLN
INTRAMUSCULAR | Status: AC
  Administered 2021-04-05: 0.4 mg
  Filled 2021-04-05: qty 1

## 2021-04-05 MED ORDER — AMLODIPINE BESYLATE 5 MG PO TABS
10.0000 mg | ORAL_TABLET | Freq: Every day | ORAL | Status: DC
  Administered 2021-04-05: 10 mg via ORAL
  Filled 2021-04-05: qty 2

## 2021-04-05 MED ORDER — ACETAMINOPHEN 325 MG PO TABS: 650.0000 mg | ORAL_TABLET | Freq: Four times a day (QID) | ORAL | Status: DC | PRN

## 2021-04-05 MED ORDER — ALBUTEROL SULFATE (2.5 MG/3ML) 0.083% IN NEBU: 3.0000 mL | INHALATION_SOLUTION | Freq: Two times a day (BID) | RESPIRATORY_TRACT | Status: DC | PRN

## 2021-04-05 MED ORDER — TOPIRAMATE 25 MG PO TABS
25.0000 mg | ORAL_TABLET | Freq: Two times a day (BID) | ORAL | Status: DC
  Administered 2021-04-05 – 2021-04-07 (×5): 25 mg via ORAL
  Filled 2021-04-05 (×6): qty 1

## 2021-04-05 MED ORDER — MAGNESIUM HYDROXIDE 400 MG/5ML PO SUSP: 30.0000 mL | Freq: Every day | ORAL | Status: DC | PRN

## 2021-04-05 MED ORDER — SUMATRIPTAN SUCCINATE 50 MG PO TABS
50.0000 mg | ORAL_TABLET | ORAL | Status: DC | PRN
  Filled 2021-04-05: qty 1

## 2021-04-05 MED ORDER — OXYBUTYNIN CHLORIDE ER 5 MG PO TB24
5.0000 mg | ORAL_TABLET | Freq: Every day | ORAL | Status: DC
  Administered 2021-04-05: 5 mg via ORAL
  Filled 2021-04-05 (×4): qty 1

## 2021-04-05 MED ORDER — ONDANSETRON HCL 4 MG PO TABS: 4.0000 mg | ORAL_TABLET | Freq: Four times a day (QID) | ORAL | Status: DC | PRN

## 2021-04-05 MED ORDER — FAMOTIDINE 20 MG PO TABS
40.0000 mg | ORAL_TABLET | Freq: Every day | ORAL | Status: DC
  Administered 2021-04-05 – 2021-04-06 (×2): 40 mg via ORAL
  Filled 2021-04-05 (×2): qty 2

## 2021-04-05 MED ORDER — INSULIN ASPART 100 UNIT/ML IJ SOLN
0.0000 [IU] | Freq: Three times a day (TID) | INTRAMUSCULAR | Status: DC
  Administered 2021-04-05: 20 [IU] via SUBCUTANEOUS
  Administered 2021-04-06: 11 [IU] via SUBCUTANEOUS
  Filled 2021-04-05 (×2): qty 1

## 2021-04-05 MED ORDER — PREGABALIN 50 MG PO CAPS
100.0000 mg | ORAL_CAPSULE | Freq: Two times a day (BID) | ORAL | Status: DC
  Administered 2021-04-05 (×2): 100 mg via ORAL
  Filled 2021-04-05 (×2): qty 2

## 2021-04-05 MED ORDER — ACETAMINOPHEN 650 MG RE SUPP: 650.0000 mg | Freq: Four times a day (QID) | RECTAL | Status: DC | PRN

## 2021-04-05 MED ORDER — MIRTAZAPINE 15 MG PO TABS
45.0000 mg | ORAL_TABLET | Freq: Every day | ORAL | Status: DC
  Administered 2021-04-05: 45 mg via ORAL
  Filled 2021-04-05: qty 3

## 2021-04-05 NOTE — ED Notes (Signed)
Pt resting with eyes closed, awake, interactive, calm, NAD, oriented to person, place, time. Wasn't sure exactly why she is here. Cleaned and changed.

## 2021-04-05 NOTE — ED Notes (Signed)
2342 Telestroke Activated. Per Nurse on Telestroke patient did not meet criteria for Code stroke due to being out of time frame. Last time known well was 04/03/21 1900. Updated Dr. Dolores Frame that patient was out of window. Per Telestroke RN and Dr. Dolores Frame, Code Stroke cancelled.

## 2021-04-05 NOTE — ED Notes (Signed)
Report received from Jacque F.  °

## 2021-04-05 NOTE — Progress Notes (Signed)
Inpatient Diabetes Program Recommendations  AACE/ADA: New Consensus Statement on Inpatient Glycemic Control (2015)  Target Ranges:  Prepandial:   less than 140 mg/dL      Peak postprandial:   less than 180 mg/dL (1-2 hours)      Critically ill patients:  140 - 180 mg/dL    Latest Reference Range & Units 04/04/21 23:05 04/05/21 07:53  Glucose-Capillary 70 - 99 mg/dL 392 (H) 450 (H)     To ED with AMS due to Accidental opiate overdose  History: DM2   Home DM Meds: Levemir 60 units daily        Regular 12 units TID    Metformin 1000 mg BID  Current Orders: Levemir 60 units daily  Novolog 0-20 units TID ac/hs       Current A1c level Pending (Last A1c per Chart Review was 13.8% at PCP visit on 10/19/2020--Dr. Iona Beard w/ [facility]--Pt wasn't taking Insulin and told to restart)   Both Insulins to start this AM.  Lab glucose 422 at [place] on arrival to ED (AG 7/ CO2 24)--Given 1L NS bolus in the ED last PM.   Met w/ pt down in the ED earlier today.  Pt was standing in a large puddle or urine and stated she couldn't make it to the bathroom.  I reminded pt to please call the staff when she needs help to use the restroom.  Called RN to bedside to help get pt back to bed safely.  While in the room, I asked pt if she is able to independently give herself all her insulin injections--pt stated "No"--I then asked pt who helps her with her insulin and she stated "No one"--I tried to clarify with pt if she remembers and is able to self-administer insulin injections and she could not answer my question and simply stated "I'm not going to a nursing home--I'm too young".  Will try to reach pt's daughter to discuss pt's home living situation and ability to give herself meds.    --Will follow patient during hospitalization--  Wyn Quaker RN, MSN, CDE Diabetes Coordinator Inpatient Glycemic Control Team Team Pager: [phone] (8a-5p)

## 2021-04-05 NOTE — Progress Notes (Signed)
°  PROGRESS NOTE  Patient admitted earlier this morning. See H&P.   [name] is a 61 year old female who presented to the emergency department due to altered mental status, decreased responsiveness.  Due to patient's somnolence, she was started on Narcan with improvement in her mentation.  Patient was seen in the emergency department, patient was easily arousable, alert and oriented to person, place, year.  She does not recall events prior to admission, states that she has been taking her hydrocodone for many years for her back pain.  She denies any other alcohol or substance abuse.  Interestingly, her opioid urine screen was negative.  Patient to wean off Narcan drip this morning and monitor mental status Hold antihypertensives in setting of hypotension Continue IV fluid Treat hyperglycemia with Semglee, NovoLog, sliding scale    Status is: Inpatient  Remains inpatient appropriate because: IVF and hypotension        Noralee Stain, DO [facility] 04/05/2021, 12:59 PM  Available via Epic secure chat 7am-7pm After these hours, please refer to coverage provider listed on [email or web address]

## 2021-04-05 NOTE — ED Notes (Signed)
Pt manual blood pressure is 92/58

## 2021-04-05 NOTE — Progress Notes (Signed)
ABG results: done @00 :16 04/05/2021 Ph        7.29 pCo2    57 pO2      63 tHb       14.8 sO2      90.9 BE        -0.5 sO2       88.8 HCO3    27.4 Hct         44

## 2021-04-05 NOTE — ED Notes (Signed)
Pt sleeping, IVF bolus infusing, BP improving slightly.

## 2021-04-05 NOTE — ED Notes (Signed)
Narcan infusing. Noted apnea and end tidal into the 10-20's. Pt less responsive. Requiring verbal and physical stimuli to wake. Dr. Beather Arbour updated on patient status.   Pharmacy contacted by Charge Nurse Remo Lipps about titration of Narcan gtt.

## 2021-04-05 NOTE — Progress Notes (Signed)
PHARMACIST - PHYSICIAN COMMUNICATION  CONCERNING:  Enoxaparin (Lovenox) for DVT Prophylaxis    RECOMMENDATION: Patient was prescribed enoxaprin 40mg  q24 hours for VTE prophylaxis.   Filed Weights   04/04/21 2312  Weight: 55 kg (121 lb 4.1 oz)    Body mass index is 24.49 kg/m.  Estimated Creatinine Clearance: 26.3 mL/min (A) (by C-G formula based on SCr of 1.72 mg/dL (H)).  Patient is candidate for enoxaparin 30mg  every 24 hours based on CrCl <71ml/min or Weight <45kg  DESCRIPTION: Pharmacy has adjusted enoxaparin dose per [facility] policy.  Patient is now receiving enoxaparin 30 mg every 24 hours   Renda Rolls, PharmD, [facility] 04/05/2021 3:21 AM

## 2021-04-05 NOTE — ED Notes (Signed)
Lying on L side, watching TV, resting/ sleeping.

## 2021-04-05 NOTE — ED Notes (Signed)
Minimal response after first dose of narcan. Pt remains lethargic with shallow respirations. Firm sternal rub needed to arouse patient. Dr. Dolores Frame updated. Second dose of Narcan ordered and given.

## 2021-04-05 NOTE — ED Notes (Signed)
BS 53. Pt arousable to voice/ awake.Drank two apple juices now. Admitting MD notified thru secure chat. Pt eating some when provoked. Did not eat much lunch. Dinner was delivered early at 1500.

## 2021-04-05 NOTE — ED Notes (Signed)
O2 returned to pt, removed while eating.

## 2021-04-05 NOTE — ED Notes (Signed)
MD notified CBG >400

## 2021-04-05 NOTE — ED Notes (Addendum)
Pt alert, NAD, calm, interactive, eating. Family at [facility] x2. Denies pain, sob, nausea sx or complaints. IVF infusing. BP trending low/soft. Will continue to monitor. Asymptomatic. New same size cuff applied, small adult L arm.

## 2021-04-05 NOTE — ED Notes (Addendum)
DM coordinator RN at [facility]. Pt standing at [facility] in puddle of urine. Linens/gown changed. A&Ox3, some scattered thinking and poor decision making. CBIR. Returned to O2. IVF infusing on pump. Alert, NAD, calm, interactive.

## 2021-04-05 NOTE — ED Notes (Signed)
Third dose of narcan given. Awake and sat up in bed. Speech slightly clearer. Follows redirection. Dr. Dolores Frame updated.

## 2021-04-05 NOTE — ED Notes (Signed)
Hypotensive. BP 89/64 MAP 74. Placed patient in trendelenburg and Dr Dolores Frame updated. Normal saline 1 L bolus initiated per verbal order by Dr. Dolores Frame.

## 2021-04-05 NOTE — ED Notes (Signed)
Alert, NAD, calm, interactive. Meds given. Family at [facility]. Eating breakfast. Denies questions or needs. VSS.

## 2021-04-05 NOTE — ED Notes (Signed)
Assisted pt to eat: peas, mashed potatoes, chicken, apple sauce, cranberry juice. 25% of meal.

## 2021-04-05 NOTE — H&P (Signed)
[place]   PATIENT NAME: [name]    MR#:  [id number]  DATE OF BIRTH:  09-Feb-1961  DATE OF ADMISSION:  04/04/2021  PRIMARY CARE PHYSICIAN: Sharyne Peach, MD   Patient is coming from: Home  REQUESTING/REFERRING PHYSICIAN: Lurline Hare, MD  CHIEF COMPLAINT:   Chief Complaint  Patient presents with   Altered Mental Status    HISTORY OF PRESENT ILLNESS:  [name] is a 61 y.o. African-American female with medical history significant for asthma, OSA, on CPAP, type 2 diabetes mellitus, hypertension, dyslipidemia, GERD and depression, who presented to the emergency room with acute onset of altered mental status with decreased responsiveness.  The patient was last seen in at 10 PM on Tuesday by her daughter and her neighbor noticed that she was not out during the day for a while but eventually came to the door to let the neighbor in.  The neighbor called EMS due to altered mental status.  She stated that her house was a mess and look like things had gotten knocked over.  She was fairly somnolent upon presentation to the ER.  During my interview on IV Narcan drip she was fairly somnolent but arousable.  She denied any chest pain or dyspnea or cough or wheezing.  No nausea or vomiting or abdominal pain.  No dysuria, oliguria or hematuria, urinary frequency or urgency or flank pain.  ED Course: Upon presentation to the emergency room, blood pressure was 97/70 and later 116/74 with otherwise normal vital signs.  Labs revealed mild hyponatremia and significant hyperglycemia of 422 with BUN of 24 and creatinine 1.72 and ALT 45 and CBC showed mild leukocytosis of 11.7.  Influenza antigens and COVID-19 PCR came back negative.  UA showed more than 500 glucose and 30 protein.  Urine drug screen came back negative.  Salicylate was less than 7 and alcohol less than 10.  Blood cultures came back negative. EKG as reviewed by me : EKG showed normal sinus rhythm with a rate of 75  with  poor R wave progression. Imaging: Chest x-ray showed low lung volumes with subsegmental atelectasis and/or scarring in the lower lungs slightly enlarged cardiomediastinal silhouette.  The patient was given IV Narcan with improvement of her mental status and with further somnolent was given another couple of doses then she was placed on IV Narcan drip.  She will be admitted to a stepdown unit bed for further evaluation and management. PAST MEDICAL HISTORY:   Past Medical History:  Diagnosis Date   Asthma    Asthma    BPPV (benign paroxysmal positional vertigo)    none recently   Cardiac murmur, unspecified 08/28/2016   CPAP (continuous positive airway pressure) dependence    Degenerative disc disease, lumbar    Depression    Diabetes mellitus    Diverticulitis 10/2016   Fatty liver    Fibromyalgia    Fibromyalgia    GERD (gastroesophageal reflux disease)    Headache    migraines.  None since starting topamax   History of degenerative disc disease    Hyperlipidemia    Hypertension    Sleep apnea    CPAP    PAST SURGICAL HISTORY:   Past Surgical History:  Procedure Laterality Date   ABDOMINAL HYSTERECTOMY     ABDOMINAL SURGERY     CATARACT EXTRACTION W/PHACO Left 09/07/2019   Procedure: CATARACT EXTRACTION PHACO AND INTRAOCULAR LENS PLACEMENT ([place]) LEFT DIABETIC;  Surgeon: Birder Robson, MD;  [facility];  Service: Ophthalmology;  Laterality: Left;  4.01 0:43.8   CATARACT EXTRACTION W/PHACO Right 09/28/2019   Procedure: CATARACT EXTRACTION PHACO AND INTRAOCULAR LENS PLACEMENT ([facility]) RIGHT DIABETIC 4.16  00:43.3;  Surgeon: Birder Robson, MD;  Location: [place];  Service: Ophthalmology;  Laterality: Right;  Diabetic - insulin and oral meds   CHOLECYSTECTOMY N/A 04/27/2018   Procedure: LAPAROSCOPIC CHOLECYSTECTOMY;  Surgeon: Herbert Pun, MD;  Location: [facility];  Service: General;  Laterality: N/A;   COLONOSCOPY WITH PROPOFOL N/A  02/20/2017   Procedure: COLONOSCOPY WITH PROPOFOL;  Surgeon: Lollie Sails, MD;  Location[facility] END[facility]COPY;  Service: Endoscopy;  Laterality: N/A;   Diverticulitis     ESOPHAGOGASTRODUODENOSCOPY (EGD) WITH PROPOFOL N/A 02/20/2017   Procedure: ESOPHAGOGASTRODUODENOSCOPY (EGD) WITH PROPOFOL;  Surgeon: Lollie Sails, MD;  Location: [facility] ENDOSCOPY;  Service: Endoscopy;  Laterality: N/A;   EUS N/A 01/08/2018   Procedure: UPPER ENDOSCOPIC ULTRASOUND (EUS) RADIAL;  Surgeon: Holly Bodily, MD;  Location: [facility] ENDOSCOPY;  Service: Gastroenterology;  Laterality: N/A;   UPPER ESOPHAGEAL ENDOSCOPIC ULTRASOUND (EUS) N/A 04/03/2017   Procedure: UPPER ESOPHAGEAL ENDOSCOPIC ULTRASOUND (EUS);  Surgeon: Holly Bodily, MD;  Location: [facility] ENDOSCOPY;  Service: Gastroenterology;  Laterality: N/A;    SOCIAL HISTORY:   Social History   Tobacco Use   Smoking status: Every Day    Packs/day: 0.50    Years: 14.00    Pack years: 7.00    Types: Cigarettes   Smokeless tobacco: Former   Tobacco comments:    had recently quit for about 1 yr.  restarted several months ago  Substance Use Topics   Alcohol use: Yes    Alcohol/week: 1.0 standard drink    Types: 1 Glasses of wine per week    FAMILY HISTORY:   Family History  Problem Relation Age of Onset   Diabetes Mother    Hypertension Mother     DRUG ALLERGIES:   Allergies  Allergen Reactions   Tramadol Itching    Headaches and confusion.   Benadryl [Diphenhydramine Hcl] Itching   Bextra [Valdecoxib] Itching   Invokana [Canagliflozin] Other (See Comments)    Causes yeast infection   Sulfamethoxazole-Trimethoprim Other (See Comments)    "Makes me feel weird"    REVIEW OF SYSTEMS:   ROS As per history of present illness. All pertinent systems were reviewed above. Constitutional, HEENT, cardiovascular, respiratory, GI, GU, musculoskeletal, neuro, psychiatric, endocrine, integumentary and hematologic systems were reviewed and  are otherwise negative/unremarkable except for positive findings mentioned above in the HPI.   MEDICATIONS AT HOME:   Prior to Admission medications   Medication Sig Start Date End Date Taking? Authorizing Provider  ACCU-CHEK GUIDE test strip USE TID 12/30/17   [provider]  albuterol (PROVENTIL HFA;VENTOLIN HFA) 108 (90 Base) MCG/ACT inhaler Inhale 1 puff into the lungs 2 (two) times daily as needed.    [provider]  amLODipine (NORVASC) 10 MG tablet Take 1 tablet by mouth daily. 03/30/20   [provider]  ARIPiprazole (ABILIFY) 5 MG tablet Take 5 mg by mouth daily.    [provider]  aspirin 81 MG tablet Take 81 mg by mouth daily.     [provider]  budesonide-formoterol (SYMBICORT) 160-4.5 MCG/ACT inhaler Inhale 2 puffs into the lungs 2 (two) times daily.    [provider]  budesonide-formoterol (SYMBICORT) 160-4.5 MCG/ACT inhaler Inhale 2 puffs into the lungs 2 (two) times daily. Patient not taking: Reported on 03/06/2021 05/05/20   [provider]  cetirizine (ZYRTEC) 10 MG tablet Take by mouth. 04/28/20   [provider]  Dexlansoprazole 30 MG capsule Take 30 mg by mouth daily.    [provider]  dicyclomine (BENTYL) 10 MG capsule Take 1 capsule (10 mg total) by mouth 4 (four) times daily for 14 days. 12/25/20 01/08/21  Cuthriell, Charline Bills, PA-C  ergocalciferol (VITAMIN D2) 1.25 MG (50000 UT) capsule TAKE 1 CAPSULE BY MOUTH 1 TIME A WEEK 01/23/21   [provider]  famotidine (PEPCID) 40 MG tablet Take 40 mg by mouth at bedtime.    [provider]  fexofenadine (ALLEGRA) 180 MG tablet Take 180 mg by mouth daily. 08/29/18   [provider]  HYDROcodone-acetaminophen (NORCO) 7.5-325 MG tablet Take 1 tablet by mouth every 8 (eight) hours as needed for severe pain. Must last 30 days. 03/16/21 04/15/21  Gillis Santa, MD  HYDROcodone-acetaminophen (NORCO) 7.5-325 MG tablet Take 1  tablet by mouth every 8 (eight) hours as needed for severe pain. Must last 30 days. 04/15/21 05/15/21  Gillis Santa, MD  HYDROcodone-acetaminophen (NORCO) 7.5-325 MG tablet Take 1 tablet by mouth every 8 (eight) hours as needed for severe pain. Must last 30 days. 05/15/21 06/14/21  Gillis Santa, MD  hydrOXYzine (ATARAX/VISTARIL) 50 MG tablet Take 50 mg by mouth 2 (two) times daily. 05/03/20   [provider]  hydrOXYzine (ATARAX/VISTARIL) 50 MG tablet Take 50 mg by mouth in the morning and at bedtime. Patient not taking: Reported on 03/06/2021    [provider]  ibuprofen (ADVIL) 800 MG tablet Take 800 mg by mouth every 6 (six) hours as needed. 12/08/20   [provider]  insulin regular (NOVOLIN R) 100 units/mL injection Inject 12 Units into the skin 3 (three) times daily before meals.    [provider]  LEVEMIR FLEXTOUCH 100 UNIT/ML Pen Inject 60 Units into the skin daily.  01/07/18   [provider]  linaclotide (LINZESS) 290 MCG CAPS capsule Take 290 mcg by mouth daily before breakfast.    [provider]  loperamide (IMODIUM A-D) 2 MG tablet Take 1 tablet (2 mg total) by mouth 4 (four) times daily as needed for diarrhea or loose stools. 12/25/20   Cuthriell, Charline Bills, PA-C  losartan (COZAAR) 100 MG tablet Take 100 mg by mouth daily. 09/10/18   [provider]  metFORMIN (GLUCOPHAGE) 1000 MG tablet Take 1,000 mg by mouth 2 (two) times daily.  06/07/16   [provider]  mirtazapine (REMERON) 45 MG tablet Take 45 mg by mouth daily. 04/14/18   [provider]  montelukast (SINGULAIR) 10 MG tablet Take 1 tablet by mouth daily. 09/14/19   [provider]  Multiple Vitamins-Minerals (ALIVE ONCE DAILY WOMENS) TABS Take 1 tablet by mouth daily.  Patient not taking: Reported on 12/05/2020    [provider]  ondansetron (ZOFRAN) 4 MG tablet Take 4 mg by mouth every 8 (eight) hours as needed for nausea or vomiting.   Patient not taking: Reported on 03/06/2021 12/30/17   [provider]  polyethylene glycol (MIRALAX / GLYCOLAX) packet Take 17 g by mouth daily.    [provider]  pregabalin (LYRICA) 100 MG capsule Take 1 capsule (100 mg total) by mouth 2 (two) times daily. 12/05/20   Gillis Santa, MD  propranolol (INDERAL) 40 MG tablet Take 40 mg by mouth 2 (two) times daily.    [provider]  rizatriptan (MAXALT) 10 MG tablet Take 10 mg by mouth as  needed for migraine. May repeat in 2 hours if needed    [provider]  rosuvastatin (CRESTOR) 20 MG tablet Take 20 mg by mouth daily.    [provider]  solifenacin (VESICARE) 10 MG tablet Take 10 mg by mouth daily. 09/28/18   [provider]  sucralfate (CARAFATE) 1 g tablet TAKE 1 TABLET BY MOUTH THREE TIMES DAILY BEFORE MEALS. DISSOLVE TABLET IN 1 TABLESPOON OF WATER TO CREATE A SLURRY 04/27/20   [provider]  testosterone cypionate (DEPOTESTOSTERONE CYPIONATE) 200 MG/ML injection Inject into the muscle. 12/20/20 01/19/21  [provider]  tolterodine (DETROL) 2 MG tablet Take 2 mg by mouth daily.    [provider]  topiramate (TOPAMAX) 25 MG tablet Take 25 mg by mouth 2 (two) times daily.    [provider]  topiramate (TOPAMAX) 25 MG tablet Take 1 tablet by mouth 2 (two) times daily. Patient not taking: Reported on 08/15/2020 04/28/20   [provider]  traZODone (DESYREL) 100 MG tablet Take 100 mg by mouth at bedtime.  Patient not taking: Reported on 08/15/2020    [provider]  traZODone (DESYREL) 150 MG tablet Take 150 mg by mouth at bedtime. 04/27/20   [provider]  triamcinolone (KENALOG) 0.1 % Apply topically. 05/03/20 05/03/21  [provider]  venlafaxine (EFFEXOR) 37.5 MG tablet Take 37.5 mg by mouth daily.    [provider]  venlafaxine XR (EFFEXOR-XR) 150 MG 24 hr capsule Take 150 mg by mouth daily. 07/15/19    [provider]      VITAL SIGNS:  Blood pressure 99/67, pulse 88, temperature 97.9 F (36.6 C), temperature source Oral, resp. rate 13, weight 55 kg, SpO2 98 %.  PHYSICAL EXAMINATION:  Physical Exam  GENERAL:  61 y.o.-year-old African-American female patient lying in the bed with no acute distress.  The patient was fairly somnolent but arousable. EYES: Pupils equal, round, reactive to light and accommodation. No scleral icterus. Extraocular muscles intact.  HEENT: Head atraumatic, normocephalic. Oropharynx and nasopharynx clear.  NECK:  Supple, no jugular venous distention. No thyroid enlargement, no tenderness.  LUNGS: Normal breath sounds bilaterally, no wheezing, rales,rhonchi or crepitation. No use of accessory muscles of respiration.  CARDIOVASCULAR: Regular rate and rhythm, S1, S2 normal. No murmurs, rubs, or gallops.  ABDOMEN: Soft, nondistended, nontender. Bowel sounds present. No organomegaly or mass.  EXTREMITIES: No pedal edema, cyanosis, or clubbing.  NEUROLOGIC: Cranial nerves II through XII are intact. Muscle strength 5/5 in all extremities. Sensation intact. Gait not checked.  PSYCHIATRIC: The patient is very somnolent but arousable with no good eye contact. SKIN: No obvious rash, lesion, or ulcer.   LABORATORY PANEL:   CBC Recent Labs  Lab 04/04/21 2347  WBC 11.7*  HGB 14.4  HCT 46.2*  PLT 195   ------------------------------------------------------------------------------------------------------------------  Chemistries  Recent Labs  Lab 04/04/21 2347  NA 133*  K 4.2  CL 102  CO2 24  GLUCOSE 422*  BUN 24*  CREATININE 1.72*  CALCIUM 8.9  AST 22  ALT 45*  ALKPHOS 113  BILITOT 0.7   ------------------------------------------------------------------------------------------------------------------  Cardiac Enzymes No results for input(s): TROPONINI in the last 168  hours. ------------------------------------------------------------------------------------------------------------------  RADIOLOGY:  DG Chest Port 1 View  Result Date: 04/04/2021 CLINICAL DATA:  Altered mental status EXAM: PORTABLE CHEST 1 VIEW COMPARISON:  01/16/2021 FINDINGS: Low lung volumes augmenting the cardiomediastinal silhouette. Prominent central pulmonary vessels. Subsegmental atelectasis or scarring in the right lower lung. Aortic atherosclerosis. No pneumothorax. IMPRESSION:  Low lung volumes with subsegmental atelectasis and or scarring in the lower lungs. Slightly enlarged cardiomediastinal silhouette likely augmented by low lung volume and portable technique. Electronically Signed   By: Donavan Foil M.D.   On: 04/04/2021 23:44   CT HEAD CODE STROKE WO CONTRAST  Result Date: 04/04/2021 CLINICAL DATA:  Code stroke. Initial evaluation for neuro deficit, stroke suspected. EXAM: CT HEAD WITHOUT CONTRAST TECHNIQUE: Contiguous axial images were obtained from the base of the skull through the vertex without intravenous contrast. RADIATION DOSE REDUCTION: This exam was performed according to the departmental dose-optimization program which includes automated exposure control, adjustment of the mA and/or kV according to patient size and/or use of iterative reconstruction technique. COMPARISON:  Prior CT from 05/05/2013. FINDINGS: Brain: Cerebral volume within normal limits. Scattered patchy hypodensity involving the supratentorial cerebral white matter, nonspecific, but most likely related chronic microvascular ischemic disease. No acute intracranial hemorrhage. No acute large vessel territory infarct. No mass lesion or midline shift. No hydrocephalus or extra-axial fluid collection. Vascular: No hyperdense vessel. Skull: Calvarium intact. Few scattered foci of soft tissue emphysema present at the right temporal and infratemporal region, suspected be related IV access. Scalp soft tissues  demonstrate no other acute finding. Sinuses/Orbits: Globes and orbital soft tissues demonstrate no acute finding. Visualized paranasal sinuses and mastoid air cells are clear. Other: None. ASPECTS [facility] Stroke Program Early CT Score) - Ganglionic level infarction (caudate, lentiform nuclei, internal capsule, insula, M1-M3 cortex): 7 - Supraganglionic infarction (M4-M6 cortex): 3 Total score (0-10 with 10 being normal): 10 IMPRESSION: 1. No acute intracranial abnormality. 2. ASPECTS is 10. 3. Patchy hypodensity involving the supratentorial cerebral white matter, nonspecific, but most likely related chronic microvascular ischemic disease. Results were called by telephone at the time of interpretation on 04/04/2021 at 11:43 pm to the emergency room physician, who verbally acknowledged these results. Electronically Signed   By: Jeannine Boga M.D.   On: 04/04/2021 23:45      IMPRESSION AND PLAN:  Principal Problem:   Opiate overdose, accidental or unintentional, sequela  1.  Accidental opiate overdose. - The patient will be admitted to a stepdown unit. - We will continue her on IV Narcan drip. - We will follow mental status. - Narcan will be tapered off gradually.  2.  Essential hypertension. - We will continue Norvasc, Cozaar and place her on as needed IV labetalol.  3.  Acute kidney injury. - This is likely prerenal due to hypovolemia. - We will hydrate with IV normal saline and follow BMP.  4.  Uncontrolled type 2 diabetes mellitus with hyperglycemia. - The patient will be placed on supplement coverage with NovoLog and will continue basal coverage.  5.  Dyslipidemia. - We will continue statin therapy.  6.  Overactive bladder. - We will continue Vesicare  7.  COPD without exacerbation. - We will continue as needed albuterol. - We will hold off Symbicort.  DVT prophylaxis: Lovenox. Code Status: full code. Family Communication:  The plan of care was discussed in details with  the patient (and family). I answered all questions. The patient agreed to proceed with the above mentioned plan. Further management will depend upon hospital course. Disposition Plan: Back to previous home environment Consults called: none. All the records are reviewed and case discussed with ED provider.  Status is: Inpatient   At the time of the admission, it appears that the appropriate admission status for this patient is inpatient.  This is judged to be reasonable and necessary in order  to provide the required intensity of service to ensure the patient's safety given the presenting symptoms, physical exam findings and initial radiographic and laboratory data in the context of comorbid conditions.  The patient requires inpatient status due to high intensity of service, high risk of further deterioration and high frequency of surveillance required.  I certify that at the time of admission, it is my clinical judgment that the patient will require inpatient hospital care extending more than 2 midnights.                            Dispo: The patient is from: Home              Anticipated d/c is to: Home              Patient currently is not medically stable to d/c.              Difficult to place patient: No   Christel Mormon M.D on 04/05/2021 at 6:11 AM  [facility]   From 7 PM-7 AM, contact night-coverage [email or web address]  CC: Primary care physician; Sharyne Peach, MD

## 2021-04-06 ENCOUNTER — Encounter: Payer: Self-pay | Admitting: Internal Medicine

## 2021-04-06 LAB — CBC
HCT: 48.6 % — ABNORMAL HIGH (ref 36.0–46.0)
Hemoglobin: 14.9 g/dL (ref 12.0–15.0)
MCH: 28.1 pg (ref 26.0–34.0)
MCHC: 30.7 g/dL (ref 30.0–36.0)
MCV: 91.5 fL (ref 80.0–100.0)
Platelets: 170 10*3/uL (ref 150–400)
RBC: 5.31 MIL/uL — ABNORMAL HIGH (ref 3.87–5.11)
RDW: 14.6 % (ref 11.5–15.5)
WBC: 7.1 10*3/uL (ref 4.0–10.5)
nRBC: 0 % (ref 0.0–0.2)

## 2021-04-06 LAB — GLUCOSE, CAPILLARY
Glucose-Capillary: 179 mg/dL — ABNORMAL HIGH (ref 70–99)
Glucose-Capillary: 204 mg/dL — ABNORMAL HIGH (ref 70–99)

## 2021-04-06 LAB — HEMOGLOBIN A1C
Hgb A1c MFr Bld: >15.5 % — ABNORMAL HIGH (ref 4.8–5.6)
Mean Plasma Glucose: >398 mg/dL

## 2021-04-06 LAB — BASIC METABOLIC PANEL
Anion gap: 5 (ref 5–15)
BUN: 9 mg/dL (ref 6–20)
CO2: 24 mmol/L (ref 22–32)
Calcium: 9.2 mg/dL (ref 8.9–10.3)
Chloride: 110 mmol/L (ref 98–111)
Creatinine, Ser: 0.5 mg/dL (ref 0.44–1.00)
GFR, Estimated: >60 mL/min (ref >60)
Glucose, Bld: 100 mg/dL — ABNORMAL HIGH (ref 70–99)
Potassium: 4.6 mmol/L (ref 3.5–5.1)
Sodium: 139 mmol/L (ref 135–145)

## 2021-04-06 LAB — CBG MONITORING, ED
Glucose-Capillary: 120 mg/dL — ABNORMAL HIGH (ref 70–99)
Glucose-Capillary: 260 mg/dL — ABNORMAL HIGH (ref 70–99)

## 2021-04-06 MED ORDER — INSULIN GLARGINE-YFGN 100 UNIT/ML ~~LOC~~ SOLN
30.0000 [IU] | Freq: Two times a day (BID) | SUBCUTANEOUS | Status: DC
  Administered 2021-04-06: 30 [IU] via SUBCUTANEOUS
  Filled 2021-04-06 (×2): qty 0.3

## 2021-04-06 MED ORDER — INSULIN ASPART PROT & ASPART (70-30 MIX) 100 UNIT/ML ~~LOC~~ SUSP
30.0000 [IU] | Freq: Two times a day (BID) | SUBCUTANEOUS | Status: DC
  Administered 2021-04-06 – 2021-04-07 (×2): 30 [IU] via SUBCUTANEOUS
  Filled 2021-04-06: qty 10

## 2021-04-06 NOTE — ED Notes (Signed)
Lunch tray at bedside. ?

## 2021-04-06 NOTE — Evaluation (Signed)
Occupational Therapy Evaluation Patient Details Name: [name] MRN: [id number] DOB: 1961/03/06 Today's Date: 04/06/2021   History of Present Illness [name] is a 61 y.o. female with medical history significant for asthma, OSA on CPAP, type 2 diabetes mellitus, hypertension, dyslipidemia, GERD and depression, who presented to the emergency room with acute onset of altered mental status with decreased responsiveness. She was started on Narcan drip with improvement in her mentation.  Hospitalization further complicated by hypotension which has improved with IV fluid.   Clinical Impression   Pt seen for OT evaluation this date in setting of acute hospitalization d/t AMS. She presents this date with some confusion, but is able to follow commands. She reports living alone in an apartment with intermittent assistance from her daughter (mostly for groceries). Pt with somewhat conflicting reports of PLOF, but overall seems she did not use AD for mobility and was performing basic self care I'ly. She relies on daughter for transportation. On OT assessment this date, pt somewhat impulsive including almost jogging to the toilet d/t some urinary incontinence and nearly sliding through the droplets on the floor. While pt primarily is able to complete fxl mobility with SUPV, CGA and moderate cues for safety used at this time d/t decreased insight/awareness. She requires MIN A for clothing mgt over hips and thorough peri care. Pt returned to bed end of session with CGA, with all needs met and in reach. Will continue to follow acutely. Recommending HHOT and increased supervision to go home.      Recommendations for follow up therapy are one component of a multi-disciplinary discharge planning process, led by the attending physician.  Recommendations may be updated based on patient status, additional functional criteria and insurance authorization.   Follow Up Recommendations  Home health OT     Assistance Recommended at Discharge Frequent or constant Supervision/Assistance  Patient can return home with the following A little help with bathing/dressing/bathroom;Assistance with cooking/housework;Direct supervision/assist for medications management;Direct supervision/assist for financial management;Assist for transportation    Functional Status Assessment  Patient has had a recent decline in their functional status and demonstrates the ability to make significant improvements in function in a reasonable and predictable amount of time.  Equipment Recommendations  BSC/3in1;Tub/shower seat;Other (comment) (youth 2WW)    Recommendations for Other Services       Precautions / Restrictions Precautions Precautions: Fall Restrictions Weight Bearing Restrictions: No      Mobility Bed Mobility Overal bed mobility: Modified Independent                  Transfers Overall transfer level: Needs assistance Equipment used: 1 person hand held assist Transfers: Sit to/from Stand Sit to Stand: Min guard;Supervision           General transfer comment: CGA with progress to SUPV, primarily requiring assist 2/2 impulsivity.      Balance Overall balance assessment: Needs assistance Sitting-balance support: No upper extremity supported Sitting balance-Leahy Scale: Good     Standing balance support: Single extremity supported Standing balance-Leahy Scale: Fair Standing balance comment: HHA some impulsivity leading to near LOB, but generally able to right herself                           ADL either performed or assessed with clinical judgement   ADL Overall ADL's : Needs assistance/impaired  General ADL Comments: Pt requires SETUP for seated UB ADLs, MIN A for seated LB ADLs such as donning socks and MIN A for standing LB ADL ssuch as clothing mgt over hips.     Vision Patient Visual Report: No change from  baseline       Perception     Praxis      Pertinent Vitals/Pain Pain Assessment: No/denies pain     Hand Dominance     Extremity/Trunk Assessment Upper Extremity Assessment Upper Extremity Assessment: Generalized weakness   Lower Extremity Assessment Lower Extremity Assessment: Generalized weakness       Communication     Cognition Arousal/Alertness: Awake/alert Behavior During Therapy: WFL for tasks assessed/performed;Impulsive Overall Cognitive Status: No family/caregiver present to determine baseline cognitive functioning                                 General Comments: While pt is able to follow all commands, she is noted to be somewhat impulsive with some decreased insight/safety awareness. She is oriented to self, place and some aspects of situation and the year. She gives somewhat conflicting PLOF information     General Comments       Exercises Other Exercises Other Exercises: OT ed with pt re: role.   Shoulder Instructions      Home Living Family/patient expects to be discharged to:: Private residence Living Arrangements: Alone Available Help at Discharge: Family;Available PRN/intermittently (dtr 2x/month gets groceries) Type of Home: Apartment Home Access: Level entry     Home Layout: One level     Bathroom Shower/Tub: Teacher, early years/pre: Standard     Home Equipment: Conservation officer, nature (2 wheels);Cane - single point   Additional Comments: states she found the walker and it's too big. Pt is 4'11"      Prior Functioning/Environment Prior Level of Function : Independent/Modified Independent             Mobility Comments: States she doesn't use AD for fxl mobility ADLs Comments: States she is able to do all basic ADLs herself. states her daughter does all the driving, but also states only 2x/month, so unclear if pt has reliable transportation        OT Problem List: Decreased strength;Decreased activity  tolerance;Decreased safety awareness;Impaired balance (sitting and/or standing);Decreased knowledge of use of DME or AE      OT Treatment/Interventions: Self-care/ADL training;Therapeutic exercise;Therapeutic activities;Cognitive remediation/compensation;Patient/family education;DME and/or AE instruction    OT Goals(Current goals can be found in the care plan section) Acute Rehab OT Goals Patient Stated Goal: to go home OT Goal Formulation: With patient Time For Goal Achievement: 04/20/21 Potential to Achieve Goals: Good ADL Goals Pt Will Perform Tub/Shower Transfer: Tub transfer;Independently Additional ADL Goal #1: Pt will Independently verbalize plan to implement x3 ECS Additional ADL Goal #2: Pt will complete 3/3 medication management tasks with no cues  OT Frequency: Min 2X/week    Co-evaluation              AM-PAC OT "6 Clicks" Daily Activity     Outcome Measure Help from another person eating meals?: None Help from another person taking care of personal grooming?: A Little Help from another person toileting, which includes using toliet, bedpan, or urinal?: A Little Help from another person bathing (including washing, rinsing, drying)?: A Little Help from another person to put on and taking off regular upper body clothing?: None Help from another person  to put on and taking off regular lower body clothing?: A Little 6 Click Score: 20   End of Session Equipment Utilized During Treatment: Gait belt Nurse Communication: Mobility status  Activity Tolerance: Patient tolerated treatment well Patient left: in bed;with call bell/phone within reach  OT Visit Diagnosis: Unsteadiness on feet (R26.81);Muscle weakness (generalized) (M62.81);History of falling (Z91.81)                Time: 2025-4270 OT Time Calculation (min): 20 min Charges:  OT General Charges $OT Visit: 1 Visit OT Evaluation $OT Eval Low Complexity: 1 Low OT Treatments $Self Care/Home Management : 8-22  mins  Gerrianne Scale, MS, OTR/L ascom [phone] 04/06/21, 2:30 PM

## 2021-04-06 NOTE — ED Notes (Signed)
ED TO INPATIENT HANDOFF REPORT  ED Nurse Name and Phone #: Delice Bisonara, RN  S Name/Age/Gender [name] 61 y.o. female Room/Bed: ED36A/ED36A  Code Status   Code Status: Full Code  Home/SNF/Other Home Patient oriented to: self, place, time, and situation Is this baseline? Yes   Triage Complete: Triage complete  Chief Complaint Opiate overdose, accidental or unintentional, sequela [T40.601S] Opioid overdose (HCC) [T40.2X1A]  Triage Note Pt presents to ER via ems from home.  Per ems, pt called neighbor around 2030 tonight and was sounding more altered than normal. Pt was speaking clearly on scene with ems and has become more slurred since coming to hospital.  Pt noted to be more somnolent, but will answer some questions when aroused. A&O x1.       Allergies Allergies  Allergen Reactions   Tramadol Itching    Headaches and confusion.   Benadryl [Diphenhydramine Hcl] Itching   Bextra [Valdecoxib] Itching   Invokana [Canagliflozin] Other (See Comments)    Causes yeast infection   Sulfamethoxazole-Trimethoprim Other (See Comments)    "Makes me feel weird"    Level of Care/Admitting Diagnosis ED Disposition     ED Disposition  Admit   Condition  --   Comment  Hospital [facility] [[id number]]  Level of Care: Progressive [102]  Admit to Progressive based on following criteria: ACUTE MENTAL DISORDER-RELATED Drug/Alcohol Ingestion/Overdose/Withdrawal, Suicidal Ideation/attempt requiring safety sitter and < Q2h monitoring/assessments, moderate to severe agitation that is managed with medication/sitter, CIWA-Ar score < 20.  Covid Evaluation: Confirmed COVID Negative  Diagnosis: Opioid overdose [facility](HCC) [id number]]  Admitting Physician: Noralee StainHOI, JENNIFER [[id number]]  Attending Physician: Noralee StainHOI, JENNIFER [phone][[id number]]  Estimated length of stay: past midnight tomorrow  Certification:: I certify this patient will need inpatient services for at least 2 midnights           B Medical/Surgery History Past Medical History:  Diagnosis Date   Asthma    Asthma    BPPV (benign paroxysmal positional vertigo)    none recently   Cardiac murmur, unspecified 08/28/2016   CPAP (continuous positive airway pressure) dependence    Degenerative disc disease, lumbar    Depression    Diabetes mellitus    Diverticulitis 10/2016   Fatty liver    Fibromyalgia    Fibromyalgia    GERD (gastroesophageal reflux disease)    Headache    migraines.  None since starting topamax   History of degenerative disc disease    Hyperlipidemia    Hypertension    Sleep apnea    CPAP   Past Surgical History:  Procedure Laterality Date   ABDOMINAL HYSTERECTOMY     ABDOMINAL SURGERY     CATARACT EXTRACTION W/PHACO Left 09/07/2019   Procedure: CATARACT EXTRACTION PHACO AND INTRAOCULAR LENS PLACEMENT (IOC) LEFT DIABETIC;  Surgeon: Galen ManilaPorfilio, William, MD;  [facility];  Service: Ophthalmology;  Laterality: Left;  4.01 0:43.8   CATARACT EXTRACTION W/PHACO Right 09/28/2019   Procedure: CATARACT EXTRACTION PHACO AND INTRAOCULAR LENS PLACEMENT (IOC) RIGHT DIABETIC 4.16  00:43.3;  Surgeon: Galen ManilaPorfilio, William, MD;  [facility];  Service: Ophthalmology;  Laterality: Right;  Diabetic - insulin and oral meds   CHOLECYSTECTOMY N/A 04/27/2018   Procedure: LAPAROSCOPIC CHOLECYSTECTOMY;  Surgeon: Carolan Shiverintron-Diaz, Edgardo, MD;  Location: [facility] ORS;  Service: General;  Laterality: N/A;   COLONOSCOPY WITH PROPOFOL N/A 02/20/2017   Procedure: COLONOSCOPY WITH PROPOFOL;  Surgeon: Christena DeemSkulskie, Martin U, MD;  [facility];  Service: Endoscopy;  Laterality: N/A;  Diverticulitis     ESOPHAGOGASTRODUODENOSCOPY (EGD) WITH PROPOFOL N/A 02/20/2017   Procedure: ESOPHAGOGASTRODUODENOSCOPY (EGD) WITH PROPOFOL;  Surgeon: Christena Deem, MD;  Location: [facility] ENDOSCOPY;  Service: Endoscopy;  Laterality: N/A;   EUS N/A 01/08/2018   Procedure: UPPER ENDOSCOPIC ULTRASOUND  (EUS) RADIAL;  Surgeon: Bearl Mulberry, MD;  Location: [facility] ENDOSCOPY;  Service: Gastroenterology;  Laterality: N/A;   UPPER ESOPHAGEAL ENDOSCOPIC ULTRASOUND (EUS) N/A 04/03/2017   Procedure: UPPER ESOPHAGEAL ENDOSCOPIC ULTRASOUND (EUS);  Surgeon: Bearl Mulberry, MD;  Location: [facility] ENDOSCOPY;  Service: Gastroenterology;  Laterality: N/A;     A IV Location/Drains/Wounds Patient Lines/Drains/Airways Status     Active Line/Drains/Airways     Name Placement date Placement time Site Days   Peripheral IV 04/05/21 20 G 1" Left;Posterior Hand 04/05/21  0030  Hand  1   External Urinary Catheter 04/05/21  0754  --  1   Incision (Closed) 04/27/18 Abdomen Other (Comment) 04/27/18  0906  -- 1075   Incision (Closed) 09/07/19 Eye Left 09/07/19  0740  -- 577   Incision (Closed) 09/28/19 Eye 09/28/19  0808  -- 556   Incision - 4 Ports Abdomen Mid;Upper Umbilicus Right;Upper Right;Mid 04/27/18  0750  -- 1075            Intake/Output Last 24 hours  Intake/Output Summary (Last 24 hours) at 04/06/2021 1524 Last data filed at 04/05/2021 1835 Gross per 24 hour  Intake 1000 ml  Output --  Net 1000 ml    Labs/Imaging Results for orders placed or performed during the hospital encounter of 04/04/21 (from the past 48 hour(s))  CBG monitoring, ED     Status: Abnormal   Collection Time: 04/04/21 11:05 PM  Result Value Ref Range   Glucose-Capillary 392 (H) 70 - 99 mg/dL    Comment: Glucose reference range applies only to samples taken after fasting for at least 8 hours.  CBC with Differential     Status: Abnormal   Collection Time: 04/04/21 11:47 PM  Result Value Ref Range   WBC 11.7 (H) 4.0 - 10.5 K/uL   RBC 5.07 3.87 - 5.11 MIL/uL   Hemoglobin 14.4 12.0 - 15.0 g/dL   HCT 79.0 (H) 24.0 - 97.3 %   MCV 91.1 80.0 - 100.0 fL   MCH 28.4 26.0 - 34.0 pg   MCHC 31.2 30.0 - 36.0 g/dL   RDW 53.2 99.2 - 42.6 %   Platelets 195 150 - 400 K/uL   nRBC 0.2 0.0 - 0.2 %   Neutrophils Relative % 64 %    Neutro Abs 7.6 1.7 - 7.7 K/uL   Lymphocytes Relative 25 %   Lymphs Abs 2.9 0.7 - 4.0 K/uL   Monocytes Relative 8 %   Monocytes Absolute 1.0 0.1 - 1.0 K/uL   Eosinophils Relative 1 %   Eosinophils Absolute 0.1 0.0 - 0.5 K/uL   Basophils Relative 1 %   Basophils Absolute 0.1 0.0 - 0.1 K/uL   Immature Granulocytes 1 %   Abs Immature Granulocytes 0.06 0.00 - 0.07 K/uL    Comment: Performed at [facility], [address] [place], [place] [address]  Comprehensive metabolic panel     Status: Abnormal   Collection Time: 04/04/21 11:47 PM  Result Value Ref Range   Sodium 133 (L) 135 - 145 mmol/L   Potassium 4.2 3.5 - 5.1 mmol/L   Chloride 102 98 - 111 mmol/L   CO2 24 22 - 32 mmol/L   Glucose, Bld 422 (H) 70 -  99 mg/dL    Comment: Glucose reference range applies only to samples taken after fasting for at least 8 hours.   BUN 24 (H) 6 - 20 mg/dL   Creatinine, Ser 1.61 (H) 0.44 - 1.00 mg/dL   Calcium 8.9 8.9 - 09.6 mg/dL   Total Protein 6.5 6.5 - 8.1 g/dL   Albumin 3.7 3.5 - 5.0 g/dL   AST 22 15 - 41 U/L   ALT 45 (H) 0 - 44 U/L   Alkaline Phosphatase 113 38 - 126 U/L   Total Bilirubin 0.7 0.3 - 1.2 mg/dL   GFR, Estimated 34 (L) >60 mL/min    Comment: (NOTE) Calculated using the CKD-EPI Creatinine Equation (2021)    Anion gap 7 5 - 15    Comment: Performed at [facility], [address] [place], [place] [address]  Troponin I (High Sensitivity)     Status: None   Collection Time: 04/04/21 11:47 PM  Result Value Ref Range   Troponin I (High Sensitivity) 8 <18 ng/L    Comment: (NOTE) Elevated high sensitivity troponin I (hsTnI) values and significant  changes across serial measurements may suggest ACS but many other  chronic and acute conditions are known to elevate hsTnI results.  Refer to the "Links" section for chest pain algorithms and additional  guidance. Performed at [facility], [address] [place], [place] [address]   Urinalysis,  Routine w reflex microscopic Urine, In & Out Cath     Status: Abnormal   Collection Time: 04/04/21 11:47 PM  Result Value Ref Range   Color, Urine YELLOW YELLOW   APPearance HAZY (A) CLEAR   Specific Gravity, Urine 1.016 1.005 - 1.030   pH 5.0 5.0 - 8.0   Glucose, UA >=500 (A) NEGATIVE mg/dL   Hgb urine dipstick NEGATIVE NEGATIVE   Bilirubin Urine NEGATIVE NEGATIVE   Ketones, ur NEGATIVE NEGATIVE mg/dL   Protein, ur 30 (A) NEGATIVE mg/dL   Nitrite NEGATIVE NEGATIVE   Leukocytes,Ua NEGATIVE NEGATIVE    Comment: Performed at [facility], [address] [place], [place] [address]  Urine Drug Screen, Qualitative     Status: None   Collection Time: 04/04/21 11:47 PM  Result Value Ref Range   Tricyclic, Ur Screen NONE DETECTED NONE DETECTED   Amphetamines, Ur Screen NONE DETECTED NONE DETECTED   MDMA (Ecstasy)Ur Screen NONE DETECTED NONE DETECTED   Cocaine Metabolite,Ur [place] NONE DETECTED NONE DETECTED   Opiate, Ur Screen NONE DETECTED NONE DETECTED   Phencyclidine (PCP) Ur S NONE DETECTED NONE DETECTED   Cannabinoid 50 Ng, Ur [place] NONE DETECTED NONE DETECTED   Barbiturates, Ur Screen NONE DETECTED NONE DETECTED   Benzodiazepine, Ur Scrn NONE DETECTED NONE DETECTED   Methadone Scn, Ur NONE DETECTED NONE DETECTED    Comment: (NOTE) Tricyclics + metabolites, urine    Cutoff 1000 ng/mL Amphetamines + metabolites, urine  Cutoff 1000 ng/mL MDMA (Ecstasy), urine              Cutoff 500 ng/mL Cocaine Metabolite, urine          Cutoff 300 ng/mL Opiate + metabolites, urine        Cutoff 300 ng/mL Phencyclidine (PCP), urine         Cutoff 25 ng/mL Cannabinoid, urine                 Cutoff 50 ng/mL Barbiturates + metabolites, urine  Cutoff 200 ng/mL Benzodiazepine, urine  Cutoff 200 ng/mL Methadone, urine                   Cutoff 300 ng/mL  The urine drug screen provides only a preliminary, unconfirmed analytical test result and should not be used for  non-medical purposes. Clinical consideration and professional judgment should be applied to any positive drug screen result due to possible interfering substances. A more specific alternate chemical method must be used in order to obtain a confirmed analytical result. Gas chromatography / mass spectrometry (GC/MS) is the preferred confirm atory method. Performed at [facility], [address] [place], [place] [address]   Lactic acid, plasma     Status: None   Collection Time: 04/04/21 11:47 PM  Result Value Ref Range   Lactic Acid, Venous 1.7 0.5 - 1.9 mmol/L    Comment: Performed at [facility], [address] [place], [place] [address]  Culture, blood (routine x 2)     Status: None (Preliminary result)   Collection Time: 04/04/21 11:47 PM   Specimen: BLOOD  Result Value Ref Range   Specimen Description BLOOD LEFT HAND    Special Requests      BOTTLES DRAWN AEROBIC AND ANAEROBIC Blood Culture adequate volume   Culture      NO GROWTH 1 DAY Performed at [facility], [address] [place], [place] [address]    Report Status PENDING   Resp Panel by RT-PCR (Flu A&B, Covid) Nasopharyngeal Swab     Status: None   Collection Time: 04/04/21 11:47 PM   Specimen: Nasopharyngeal Swab; Nasopharyngeal(NP) swabs in vial transport medium  Result Value Ref Range   SARS Coronavirus 2 by RT PCR NEGATIVE NEGATIVE    Comment: (NOTE) SARS-CoV-2 target nucleic acids are NOT DETECTED.  The SARS-CoV-2 RNA is generally detectable in upper respiratory specimens during the acute phase of infection. The lowest concentration of SARS-CoV-2 viral copies this assay can detect is 138 copies/mL. A negative result does not preclude SARS-Cov-2 infection and should not be used as the sole basis for treatment or other patient management decisions. A negative result may occur with  improper specimen collection/handling, submission of specimen other than nasopharyngeal swab,  presence of viral mutation(s) within the areas targeted by this assay, and inadequate number of viral copies(<138 copies/mL). A negative result must be combined with clinical observations, patient history, and epidemiological information. The expected result is Negative.  Fact Sheet for Patients:  BloggerCourse.com  Fact Sheet for Healthcare Providers:  SeriousBroker.it  This test is no t yet approved or cleared by the [place] FDA and  has been authorized for detection and/or diagnosis of SARS-CoV-2 by FDA under an Emergency Use Authorization (EUA). This EUA will remain  in effect (meaning this test can be used) for the duration of the COVID-19 declaration under Section 564(b)(1) of the Act, 21 U.S.C.section 360bbb-3(b)(1), unless the authorization is terminated  or revoked sooner.       Influenza A by PCR NEGATIVE NEGATIVE   Influenza B by PCR NEGATIVE NEGATIVE    Comment: (NOTE) The Xpert Xpress SARS-CoV-2/FLU/RSV plus assay is intended as an aid in the diagnosis of influenza from Nasopharyngeal swab specimens and should not be used as a sole basis for treatment. Nasal washings and aspirates are unacceptable for Xpert Xpress SARS-CoV-2/FLU/RSV testing.  Fact Sheet for Patients: BloggerCourse.com  Fact Sheet for Healthcare Providers: SeriousBroker.it  This test is not yet approved or cleared by the [place] and has been authorized  for detection and/or diagnosis of SARS-CoV-2 by FDA under an Emergency Use Authorization (EUA). This EUA will remain in effect (meaning this test can be used) for the duration of the COVID-19 declaration under Section 564(b)(1) of the Act, 21 U.S.C. section 360bbb-3(b)(1), unless the authorization is terminated or revoked.  Performed at [facility], [address] [place], [place] [address]   Ethanol     Status: None    Collection Time: 04/04/21 11:47 PM  Result Value Ref Range   Alcohol, Ethyl (B) <10 <10 mg/dL    Comment: (NOTE) Lowest detectable limit for serum alcohol is 10 mg/dL.  For medical purposes only. Performed at [facility], [address] [place], [place] [address]   Acetaminophen level     Status: Abnormal   Collection Time: 04/04/21 11:47 PM  Result Value Ref Range   Acetaminophen (Tylenol), Serum <10 (L) 10 - 30 ug/mL    Comment: (NOTE) Therapeutic concentrations vary significantly. A range of 10-30 ug/mL  may be an effective concentration for many patients. However, some  are best treated at concentrations outside of this range. Acetaminophen concentrations >150 ug/mL at 4 hours after ingestion  and >50 ug/mL at 12 hours after ingestion are often associated with  toxic reactions.  Performed at [facility], [address] [place], [place] [address]   Salicylate level     Status: Abnormal   Collection Time: 04/04/21 11:47 PM  Result Value Ref Range   Salicylate Lvl <7.0 (L) 7.0 - 30.0 mg/dL    Comment: Performed at [facility], [address] [place], [place] [address]  Urinalysis, Microscopic (reflex)     Status: None   Collection Time: 04/04/21 11:47 PM  Result Value Ref Range   RBC / HPF 0-5 0 - 5 RBC/hpf   WBC, UA 0-5 0 - 5 WBC/hpf   Bacteria, UA NONE SEEN NONE SEEN   Squamous Epithelial / LPF 0-5 0 - 5   Mucus PRESENT    Hyaline Casts, UA PRESENT     Comment: Performed at [facility], [address], [place], [place] [address]  Ammonia     Status: Abnormal   Collection Time: 04/05/21 12:34 AM  Result Value Ref Range   Ammonia 46 (H) 9 - 35 umol/L    Comment: Performed at [facility], [address] [place], [place] [address]  Lactic acid, plasma     Status: None   Collection Time: 04/05/21  1:45 AM  Result Value Ref Range   Lactic Acid, Venous 1.3 0.5 - 1.9 mmol/L    Comment: Performed at [facility], [address], [place], [place] [address]  Troponin I (High Sensitivity)     Status: None   Collection Time: 04/05/21  1:45 AM  Result Value Ref Range   Troponin I (High Sensitivity) 8 <18 ng/L    Comment: (NOTE) Elevated high sensitivity troponin I (hsTnI) values and significant  changes across serial measurements may suggest ACS but many other  chronic and acute conditions are known to elevate hsTnI results.  Refer to the "Links" section for chest pain algorithms and additional  guidance. Performed at [facility], [address] [place], [place] [address]   Culture, blood (Routine X 2) w Reflex to ID Panel     Status: None (Preliminary result)   Collection Time: 04/05/21  1:45 AM   Specimen: BLOOD  Result Value Ref Range   Specimen Description BLOOD RIGHT HAND  Special Requests      BOTTLES DRAWN AEROBIC AND ANAEROBIC Blood Culture adequate volume   Culture      NO GROWTH 1 DAY Performed at [facility], [address] [place], [place] [address]    Report Status PENDING   HIV Antibody (routine testing w rflx)     Status: None   Collection Time: 04/05/21  7:39 AM  Result Value Ref Range   HIV Screen 4th Generation wRfx Non Reactive Non Reactive    Comment: Performed at [facility], [address], [place], [place] [address]  Basic metabolic panel     Status: Abnormal   Collection Time: 04/05/21  7:39 AM  Result Value Ref Range   Sodium 134 (L) 135 - 145 mmol/L   Potassium 4.9 3.5 - 5.1 mmol/L   Chloride 105 98 - 111 mmol/L   CO2 25 22 - 32 mmol/L   Glucose, Bld 452 (H) 70 - 99 mg/dL    Comment: Glucose reference range applies only to samples taken after fasting for at least 8 hours.   BUN 16 6 - 20 mg/dL   Creatinine, Ser 0.98 0.44 - 1.00 mg/dL   Calcium 9.0 8.9 - 11.9 mg/dL   GFR, Estimated >14 >78 mL/min    Comment: (NOTE) Calculated using the CKD-EPI Creatinine Equation (2021)    Anion gap 4 (L) 5 - 15    Comment:  Performed at [facility], [address] [place], [place] [address]  CBC     Status: Abnormal   Collection Time: 04/05/21  7:39 AM  Result Value Ref Range   WBC 11.3 (H) 4.0 - 10.5 K/uL   RBC 5.22 (H) 3.87 - 5.11 MIL/uL   Hemoglobin 14.6 12.0 - 15.0 g/dL   HCT 13.0 (H) 86.5 - 78.4 %   MCV 90.2 80.0 - 100.0 fL   MCH 28.0 26.0 - 34.0 pg   MCHC 31.0 30.0 - 36.0 g/dL   RDW 69.6 29.5 - 28.4 %   Platelets 173 150 - 400 K/uL   nRBC 0.0 0.0 - 0.2 %    Comment: Performed at [facility], [address] [place], [place] [address]  Hemoglobin A1c     Status: Abnormal   Collection Time: 04/05/21  7:39 AM  Result Value Ref Range   Hgb A1c MFr Bld >15.5 (H) 4.8 - 5.6 %    Comment: (NOTE) **Verified by repeat analysis**         Prediabetes: 5.7 - 6.4         Diabetes: >6.4         Glycemic control for adults with diabetes: <7.0    Mean Plasma Glucose >398 mg/dL    Comment: (NOTE) Performed At: [facility] [address] [place], [place] [id number] Jolene Schimke MD UY:[id number]   CBG monitoring, ED     Status: Abnormal   Collection Time: 04/05/21  7:53 AM  Result Value Ref Range   Glucose-Capillary 450 (H) 70 - 99 mg/dL    Comment: Glucose reference range applies only to samples taken after fasting for at least 8 hours.  CBG monitoring, ED     Status: Abnormal   Collection Time: 04/05/21 10:13 AM  Result Value Ref Range   Glucose-Capillary 432 (H) 70 - 99 mg/dL    Comment: Glucose reference range applies only to samples taken after fasting for at least 8 hours.  CBG monitoring, ED     Status: Abnormal   Collection  Time: 04/05/21 11:59 AM  Result Value Ref Range   Glucose-Capillary 436 (H) 70 - 99 mg/dL    Comment: Glucose reference range applies only to samples taken after fasting for at least 8 hours.  CBG monitoring, ED     Status: Abnormal   Collection Time: 04/05/21  4:28 PM  Result Value Ref Range   Glucose-Capillary 53 (L) 70 - 99 mg/dL     Comment: Glucose reference range applies only to samples taken after fasting for at least 8 hours.  CBG monitoring, ED     Status: None   Collection Time: 04/05/21  4:45 PM  Result Value Ref Range   Glucose-Capillary 75 70 - 99 mg/dL    Comment: Glucose reference range applies only to samples taken after fasting for at least 8 hours.  CBG monitoring, ED     Status: None   Collection Time: 04/05/21  5:17 PM  Result Value Ref Range   Glucose-Capillary 86 70 - 99 mg/dL    Comment: Glucose reference range applies only to samples taken after fasting for at least 8 hours.  CBG monitoring, ED     Status: None   Collection Time: 04/05/21  6:31 PM  Result Value Ref Range   Glucose-Capillary 94 70 - 99 mg/dL    Comment: Glucose reference range applies only to samples taken after fasting for at least 8 hours.  Comprehensive metabolic panel     Status: Abnormal   Collection Time: 04/05/21  7:21 PM  Result Value Ref Range   Sodium 137 135 - 145 mmol/L   Potassium 3.9 3.5 - 5.1 mmol/L   Chloride 109 98 - 111 mmol/L   CO2 23 22 - 32 mmol/L   Glucose, Bld 91 70 - 99 mg/dL    Comment: Glucose reference range applies only to samples taken after fasting for at least 8 hours.   BUN 15 6 - 20 mg/dL   Creatinine, Ser 1.61 0.44 - 1.00 mg/dL   Calcium 8.2 (L) 8.9 - 10.3 mg/dL   Total Protein 5.7 (L) 6.5 - 8.1 g/dL   Albumin 2.9 (L) 3.5 - 5.0 g/dL   AST 67 (H) 15 - 41 U/L   ALT 58 (H) 0 - 44 U/L   Alkaline Phosphatase 103 38 - 126 U/L   Total Bilirubin 0.4 0.3 - 1.2 mg/dL   GFR, Estimated >09 >60 mL/min    Comment: (NOTE) Calculated using the CKD-EPI Creatinine Equation (2021)    Anion gap 5 5 - 15    Comment: Performed at [facility], [address], [place], [place] [address]  Magnesium     Status: None   Collection Time: 04/05/21  7:21 PM  Result Value Ref Range   Magnesium 1.7 1.7 - 2.4 mg/dL    Comment: Performed at [facility], [address] [place], [place] [address]   CBG monitoring, ED     Status: Abnormal   Collection Time: 04/05/21  9:53 PM  Result Value Ref Range   Glucose-Capillary 118 (H) 70 - 99 mg/dL    Comment: Glucose reference range applies only to samples taken after fasting for at least 8 hours.  CBC     Status: Abnormal   Collection Time: 04/06/21  6:34 AM  Result Value Ref Range   WBC 7.1 4.0 - 10.5 K/uL   RBC 5.31 (H) 3.87 - 5.11 MIL/uL   Hemoglobin 14.9 12.0 - 15.0 g/dL   HCT 47.8 (H) 29.5 - 62.1 %  MCV 91.5 80.0 - 100.0 fL   MCH 28.1 26.0 - 34.0 pg   MCHC 30.7 30.0 - 36.0 g/dL   RDW 16.1 09.6 - 04.5 %   Platelets 170 150 - 400 K/uL   nRBC 0.0 0.0 - 0.2 %    Comment: Performed at [facility], [address] [place], [place] [address]  Basic metabolic panel     Status: Abnormal   Collection Time: 04/06/21  6:34 AM  Result Value Ref Range   Sodium 139 135 - 145 mmol/L   Potassium 4.6 3.5 - 5.1 mmol/L   Chloride 110 98 - 111 mmol/L   CO2 24 22 - 32 mmol/L   Glucose, Bld 100 (H) 70 - 99 mg/dL    Comment: Glucose reference range applies only to samples taken after fasting for at least 8 hours.   BUN 9 6 - 20 mg/dL   Creatinine, Ser 1.91 0.44 - 1.00 mg/dL   Calcium 9.2 8.9 - 47.8 mg/dL   GFR, Estimated >29 >56 mL/min    Comment: (NOTE) Calculated using the CKD-EPI Creatinine Equation (2021)    Anion gap 5 5 - 15    Comment: Performed at [facility], [address] [place], [place] [address]  CBG monitoring, ED     Status: Abnormal   Collection Time: 04/06/21  7:34 AM  Result Value Ref Range   Glucose-Capillary 120 (H) 70 - 99 mg/dL    Comment: Glucose reference range applies only to samples taken after fasting for at least 8 hours.  CBG monitoring, ED     Status: Abnormal   Collection Time: 04/06/21 11:31 AM  Result Value Ref Range   Glucose-Capillary 260 (H) 70 - 99 mg/dL    Comment: Glucose reference range applies only to samples taken after fasting for at least 8 hours.   DG Chest Port 1  View  Result Date: 04/04/2021 CLINICAL DATA:  Altered mental status EXAM: PORTABLE CHEST 1 VIEW COMPARISON:  01/16/2021 FINDINGS: Low lung volumes augmenting the cardiomediastinal silhouette. Prominent central pulmonary vessels. Subsegmental atelectasis or scarring in the right lower lung. Aortic atherosclerosis. No pneumothorax. IMPRESSION: Low lung volumes with subsegmental atelectasis and or scarring in the lower lungs. Slightly enlarged cardiomediastinal silhouette likely augmented by low lung volume and portable technique. Electronically Signed   By: Jasmine Pang M.D.   On: 04/04/2021 23:44   CT HEAD CODE STROKE WO CONTRAST  Result Date: 04/04/2021 CLINICAL DATA:  Code stroke. Initial evaluation for neuro deficit, stroke suspected. EXAM: CT HEAD WITHOUT CONTRAST TECHNIQUE: Contiguous axial images were obtained from the base of the skull through the vertex without intravenous contrast. RADIATION DOSE REDUCTION: This exam was performed according to the departmental dose-optimization program which includes automated exposure control, adjustment of the mA and/or kV according to patient size and/or use of iterative reconstruction technique. COMPARISON:  Prior CT from 05/05/2013. FINDINGS: Brain: Cerebral volume within normal limits. Scattered patchy hypodensity involving the supratentorial cerebral white matter, nonspecific, but most likely related chronic microvascular ischemic disease. No acute intracranial hemorrhage. No acute large vessel territory infarct. No mass lesion or midline shift. No hydrocephalus or extra-axial fluid collection. Vascular: No hyperdense vessel. Skull: Calvarium intact. Few scattered foci of soft tissue emphysema present at the right temporal and infratemporal region, suspected be related IV access. Scalp soft tissues demonstrate no other acute finding. Sinuses/Orbits: Globes and orbital soft tissues demonstrate no acute finding. Visualized paranasal sinuses and mastoid air cells  are clear. Other: None. ASPECTS (  [place] Stroke Program Early CT Score) - Ganglionic level infarction (caudate, lentiform nuclei, internal capsule, insula, M1-M3 cortex): 7 - Supraganglionic infarction (M4-M6 cortex): 3 Total score (0-10 with 10 being normal): 10 IMPRESSION: 1. No acute intracranial abnormality. 2. ASPECTS is 10. 3. Patchy hypodensity involving the supratentorial cerebral white matter, nonspecific, but most likely related chronic microvascular ischemic disease. Results were called by telephone at the time of interpretation on 04/04/2021 at 11:43 pm to the emergency room physician, who verbally acknowledged these results. Electronically Signed   By: Rise MuBenjamin  McClintock M.D.   On: 04/04/2021 23:45    Pending Labs Unresulted Labs (From admission, onward)     Start     Ordered   04/07/21 0500  Hepatic function panel  Tomorrow morning,   STAT        04/06/21 1236            Vitals/Pain Today's Vitals   04/06/21 1030 04/06/21 1130 04/06/21 1230 04/06/21 1300  BP: (!) 142/87 140/80 139/80 136/86  Pulse: 88 90 93 93  Resp: 17 (!) 24 (!) 21 20  Temp:      TempSrc:      SpO2: 94% 98% 92% 96%  Weight:      PainSc:        Isolation Precautions No active isolations  Medications Medications  aspirin EC tablet 81 mg (81 mg Oral Given 04/06/21 1035)  SUMAtriptan (IMITREX) tablet 50 mg (has no administration in time range)  rosuvastatin (CRESTOR) tablet 20 mg (20 mg Oral Given 04/06/21 1036)  ARIPiprazole (ABILIFY) tablet 5 mg (5 mg Oral Given 04/06/21 1036)  traZODone (DESYREL) tablet 150 mg (150 mg Oral Given 04/05/21 2157)  venlafaxine XR (EFFEXOR-XR) 24 hr capsule 150 mg (150 mg Oral Given 04/06/21 1036)  pantoprazole (PROTONIX) EC tablet 40 mg (40 mg Oral Given 04/06/21 1036)  dicyclomine (BENTYL) capsule 10 mg (10 mg Oral Given 04/06/21 1346)  famotidine (PEPCID) tablet 40 mg (40 mg Oral Given 04/05/21 2154)  linaclotide (LINZESS) capsule 290 mcg (290 mcg Oral Given 04/06/21  0828)  loperamide (IMODIUM) capsule 2 mg (has no administration in time range)  polyethylene glycol (MIRALAX / GLYCOLAX) packet 17 g (17 g Oral Patient Refused/Not Given 04/06/21 1038)  sucralfate (CARAFATE) tablet 1 g (1 g Oral Given 04/06/21 1137)  darifenacin (ENABLEX) 24 hr tablet 7.5 mg (7.5 mg Oral Given 04/06/21 1036)  oxybutynin (DITROPAN-XL) 24 hr tablet 5 mg (5 mg Oral Given 04/05/21 2154)  topiramate (TOPAMAX) tablet 25 mg (25 mg Oral Given 04/06/21 1036)  albuterol (PROVENTIL) (2.5 MG/3ML) 0.083% nebulizer solution 3 mL (has no administration in time range)  mometasone-formoterol (DULERA) 200-5 MCG/ACT inhaler 2 puff (2 puffs Inhalation Given 04/06/21 0830)  montelukast (SINGULAIR) tablet 10 mg (10 mg Oral Given 04/06/21 1036)  enoxaparin (LOVENOX) injection 30 mg (30 mg Subcutaneous Given 04/06/21 0831)  acetaminophen (TYLENOL) tablet 650 mg (has no administration in time range)    Or  acetaminophen (TYLENOL) suppository 650 mg (has no administration in time range)  traZODone (DESYREL) tablet 25 mg (has no administration in time range)  magnesium hydroxide (MILK OF MAGNESIA) suspension 30 mL (has no administration in time range)  ondansetron (ZOFRAN) tablet 4 mg (has no administration in time range)    Or  ondansetron (ZOFRAN) injection 4 mg (has no administration in time range)  insulin aspart protamine- aspart (NOVOLOG MIX 70/30) injection 30 Units (has no administration in time range)  naloxone [facility](NARCAN) injection 0.4 mg (0.4 mg Intravenous Given 04/05/21  0000)  naloxone [facility]) injection 0.4 mg (0.4 mg Intravenous Given 04/05/21 0015)  naloxone (NARCAN) 0.4 MG/ML injection (0.4 mg  Given 04/05/21 0030)  0.9 %  sodium chloride infusion (0 mLs Intravenous Stopped 04/05/21 0045)  insulin aspart (novoLOG) injection 30 Units (30 Units Subcutaneous Given 04/05/21 1216)  sodium chloride 0.9 % bolus 1,000 mL (0 mLs Intravenous Stopped 04/05/21 1835)  lactated ringers bolus 500 mL (0 mLs  Intravenous Stopped 04/05/21 2145)    Mobility walks High fall risk   Focused Assessments Cardiac Assessment Handoff:  Cardiac Rhythm: Normal sinus rhythm Lab Results  Component Value Date   TROPONINI <0.03 10/16/2016   No results found for: DDIMER Does the Patient currently have chest pain? No    R Recommendations: See Admitting Provider Note  Report given to:   Additional Notes:

## 2021-04-06 NOTE — ED Notes (Signed)
Patient given apple sauce and graham crackers as requested.

## 2021-04-06 NOTE — Progress Notes (Signed)
Inpatient Diabetes Program Recommendations  AACE/ADA: New Consensus Statement on Inpatient Glycemic Control (2015)  Target Ranges:  Prepandial:   less than 140 mg/dL      Peak postprandial:   less than 180 mg/dL (1-2 hours)      Critically ill patients:  140 - 180 mg/dL    Latest Reference Range & Units 04/04/21 23:05 04/05/21 07:53 04/05/21 10:13 04/05/21 11:59 04/05/21 16:28 04/05/21 16:45 04/05/21 17:17 04/05/21 18:31 04/05/21 21:53  Glucose-Capillary 70 - 99 mg/dL 392 (H) 450 (H) 432 (H)  20 units Novolog @1021   60 units Levemir @1034  436 (H)  30 units Novolog @1216  53 (L) 75 86 94 118 (H)    Latest Reference Range & Units 04/06/21 07:34 04/06/21 11:31  Glucose-Capillary 70 - 99 mg/dL 120 (H) 260 (H)  11 units Novolog  30 units Semglee @1038     Latest Reference Range & Units 04/05/21 07:39  Hemoglobin A1C 4.8 - 5.6 % >15.5 (H)  (>398 mg/dl)  (H): Data is abnormally high   To ED with AMS due to Accidental opiate overdose   History: DM2   Home DM Meds: Levemir 60 units daily                              Regular 20 units TID    Metformin 1000 mg BID   Current Orders: Semglee 30 units BID Novolog 0-20 units TID ac/hs      MD- Daughter requested referral for home health to come to house to make sure pt taking meds and also requested we see if we can simplify insulin regimen.  Currently pt is supposed to be taking Levemir insulin 60 units daily + Regular Insulin 20 units TID with meals (4 injections per day).  Could we change her to 70/30 Insulin BID with Breakfast and Dinner to see if pt would be more adherent to 2 injections per day (per daughter, pt eats well at home and dtr also brings food to pt)  If you decide to convert pt to 70/30 Insulin, could try: 70/30 Insulin 30 units BID with breakfast and dinner  This would provide pt with 42 units total of longer-acting insulin and 9 units BID of shorter-acting insulin with bfast and dinner      Current A1c  level >15.5% (Last A1c per Chart Review was 13.8% at PCP visit on 10/19/2020--Dr. Iona Beard w/ [facility]--Pt wasn't taking Insulin and told to restart)   I attempted to speak with pt yesterday but was not able to obtain much helpful information from her.  I called pt's daughter Margreta Journey Stephani this afternoon.  Dtr does not live with pt but checks on pt several times per week when she can--Dtr stated pt is capable of taking her meds but often will not take meds b/c she gets sick of taking medicine and injecting insulin--Dtr agreed with me that pt likely not taking any of her insulin since her A1c was so highly elevated.  Dtr told me she is busy with 4 children and cannot always get to pt's house to make sure she is taking her meds.  Dtr requested referral for home health to come to house to make sure pt taking meds and also requested we see if we can simplify insulin regimen.    --Will follow patient during hospitalization--  Wyn Quaker RN, MSN, CDE Diabetes Coordinator Inpatient Glycemic Control Team Team Pager: [phone] (8a-5p)

## 2021-04-06 NOTE — Evaluation (Signed)
Physical Therapy Evaluation Patient Details Name: [name] MRN: [id number] DOB: 10/09/1960 Today's Date: 04/06/2021  History of Present Illness  Pt is a 61 y.o. female presenting to hospital 04/04/21 with chief complaint of decreased LOC.  EMS reporting facial droop.  CT of head negative for acute intracranial abnormality.  Pt admitted with accidental opiate overdose, acute kidney injury, and uncontrolled type 2 diabetes mellitus with hyperglycemia.  Pt was started on Narcan drip with improvement in her mentation.  Hospitalization further complicated by hypotension which has improved with IV fluid.  PMH includes asthma, DM, fibromyalgia, BPPV, OSA on CPAP, h/o abdominal sx.  Clinical Impression  Prior to hospital admission, pt was independent with functional mobility; lives alone in level entry apt; daughter assists with groceries and transportation.  Pt oriented to person, place, month/year but not situation.  Currently pt is SBA to min assist with bed mobility; CGA to SBA with transfers; and CGA to SBA ambulating 160 feet no AD use.  No loss of balance noted during sessions activities.  Pt would benefit from skilled PT to address noted impairments and functional limitations (see below for any additional details).  Upon hospital discharge, no further PT needs anticipated.    Recommendations for follow up therapy are one component of a multi-disciplinary discharge planning process, led by the attending physician.  Recommendations may be updated based on patient status, additional functional criteria and insurance authorization.  Follow Up Recommendations No PT follow up    Assistance Recommended at Discharge PRN  Patient can return home with the following  Assistance with cooking/housework    Equipment Recommendations None recommended by PT  Recommendations for Other Services       Functional Status Assessment Patient has had a recent decline in their functional status and  demonstrates the ability to make significant improvements in function in a reasonable and predictable amount of time.     Precautions / Restrictions Precautions Precautions: Fall Restrictions Weight Bearing Restrictions: No      Mobility  Bed Mobility Overal bed mobility: Modified Independent;Needs Assistance Bed Mobility: Supine to Sit;Sit to Supine     Supine to sit: Min assist;HOB elevated Sit to supine: Supervision;HOB elevated   General bed mobility comments: Semi-supine to sitting edge of bed min assist for trunk; SBA sit to supine in bed    Transfers Overall transfer level: Needs assistance Equipment used: None Transfers: Sit to/from Stand Sit to Stand: Min guard;Supervision           General transfer comment: x1 trial standing from ED stretcher bed and x1 trial standing from [facility]; stand step turn bed to/from BSC SBA (steady)    Ambulation/Gait Ambulation/Gait assistance: Min guard;Supervision Gait Distance (Feet): 160 Feet Assistive device: None Gait Pattern/deviations: Step-through pattern Gait velocity: decreased     General Gait Details: mild increased B lateral sway but no loss of balance  Stairs            Wheelchair Mobility    Modified Rankin (Stroke Patients Only)       Balance Overall balance assessment: Needs assistance Sitting-balance support: No upper extremity supported;Feet supported Sitting balance-Leahy Scale: Good Sitting balance - Comments: steady sitting reaching within BOS   Standing balance support: During functional activity Standing balance-Leahy Scale: Good Standing balance comment: no loss of balance with ambulation noted                             Pertinent Vitals/Pain  Pain Assessment: No/denies pain Vitals (HR and O2 on room air) stable and WFL throughout treatment session.    Home Living Family/patient expects to be discharged to:: Private residence Living Arrangements: Alone Available Help at  Discharge: Family;Available PRN/intermittently (pt goes with daughter 2x's a month to get groceries) Type of Home: Apartment Home Access: Level entry       Home Layout: One level Home Equipment: Agricultural consultant (2 wheels);Cane - single point Additional Comments: pt reports she has a walker that she found but it is too big and she lets other people use it    Prior Function Prior Level of Function : Independent/Modified Independent             Mobility Comments: No AD use for functional mobility at baseline.  Pt does report 1 recent fall. ADLs Comments: Per OT eval "States she is able to do all basic ADLs herself. states her daughter does all the driving, but also states only 2x/month, so unclear if pt has reliable transportation".     Hand Dominance        Extremity/Trunk Assessment   Upper Extremity Assessment Upper Extremity Assessment: Generalized weakness    Lower Extremity Assessment Lower Extremity Assessment: Generalized weakness    Cervical / Trunk Assessment Cervical / Trunk Assessment: Normal  Communication   Communication: No difficulties  Cognition Arousal/Alertness: Awake/alert Behavior During Therapy: WFL for tasks assessed/performed Overall Cognitive Status: No family/caregiver present to determine baseline cognitive functioning                                 General Comments: Oriented to person, place, month/year.  Unable to state situation.        General Comments  Nursing cleared pt for participation in physical therapy.  Pt agreeable to PT session.    Exercises    Assessment/Plan    PT Assessment Patient needs continued PT services  PT Problem List Decreased strength;Decreased balance;Decreased mobility;Decreased cognition;Decreased safety awareness       PT Treatment Interventions DME instruction;Gait training;Functional mobility training;Therapeutic activities;Therapeutic exercise;Balance training;Patient/family education     PT Goals (Current goals can be found in the Care Plan section)  Acute Rehab PT Goals Patient Stated Goal: to go home PT Goal Formulation: With patient Time For Goal Achievement: 04/20/21 Potential to Achieve Goals: Good    Frequency Min 2X/week     Co-evaluation               AM-PAC PT "6 Clicks" Mobility  Outcome Measure Help needed turning from your back to your side while in a flat bed without using bedrails?: None Help needed moving from lying on your back to sitting on the side of a flat bed without using bedrails?: A Little Help needed moving to and from a bed to a chair (including a wheelchair)?: A Little Help needed standing up from a chair using your arms (e.g., wheelchair or bedside chair)?: A Little Help needed to walk in hospital room?: A Little Help needed climbing 3-5 steps with a railing? : A Little 6 Click Score: 19    End of Session Equipment Utilized During Treatment: Gait belt Activity Tolerance: Patient tolerated treatment well Patient left: in bed;with call bell/phone within reach Nurse Communication: Mobility status;Precautions PT Visit Diagnosis: Other abnormalities of gait and mobility (R26.89);Muscle weakness (generalized) (M62.81);History of falling (Z91.81)    Time: 9381-0175 PT Time Calculation (min) (ACUTE ONLY): 21 min  Charges:   PT Evaluation $PT Eval Low Complexity: 1 Low PT Treatments $Therapeutic Activity: 8-22 mins       Hendricks LimesEmily Dravin Lance, PT 04/06/21, 3:44 PM

## 2021-04-06 NOTE — ED Notes (Signed)
Report given to Delice Bison, RN- Cpod.

## 2021-04-06 NOTE — ED Notes (Signed)
Patient's purewick repositioned at this time.

## 2021-04-06 NOTE — Progress Notes (Signed)
PROGRESS NOTE    [name]  QJJ:[id number] DOB: [birth year] DOA: 04/04/2021 PCP: Rayetta Humphrey, MD     Brief Narrative:  [name] is a 61 y.o. female with medical history significant for asthma, OSA on CPAP, type 2 diabetes mellitus, hypertension, dyslipidemia, GERD and depression, who presented to the emergency room with acute onset of altered mental status with decreased responsiveness.  The patient was last seen in at 10 PM on Tuesday by her daughter and her neighbor noticed that she was not out during the day for a while but eventually came to the door to let the neighbor in.  The neighbor called EMS due to altered mental status.  She stated that her house was a mess and look like things had gotten knocked over.  She was fairly somnolent upon presentation to the ER.  She was started on Narcan drip with improvement in her mentation.  Hospitalization further complicated by hypotension which has improved with IV fluid.  New events last 24 hours / Subjective: Patient seen in the emergency department.  She is eating oatmeal in bed.  She is alert and oriented to person, place, year.  Remains a poor historian overall.  Blood pressure has improved this morning.  Assessment & Plan:   Principal Problem:   Opiate overdose, accidental or unintentional, sequela Active Problems:   Essential hypertension   Chronic pain syndrome   Hyperlipidemia, mixed   Type 2 diabetes mellitus without complications (HCC)   Anxiety   Opioid overdose (HCC)   Acute toxic encephalopathy secondary to accidental opioid overdose -Status post Narcan drip -Improved and back to her baseline -Hold atarax   Hypotension -Improved with IV fluid, continue to hold antihypertensive (cozaar, propranolol, norvasc) today  DM type 2 with hyperglycemia  -Continue Semglee, sliding scale insulin - dose decreased due to hypoglycemia   Elevated liver enzyme -Trend  Hyperactive bladder -Continue enablex,  Ditropan   HLD -Continue Crestor   Mood disorder -Continue Abilify, Topamax, Effexor, Trazodone    DVT prophylaxis:  enoxaparin (LOVENOX) injection 30 mg Start: 04/05/21 0800  Code Status: Full code Family Communication: Daughter over the phone  Disposition Plan:  Status is: Inpatient  Remains inpatient appropriate because: Hypotension overnight, needs PT     Antimicrobials:  Anti-infectives (From admission, onward)    None        Objective: Vitals:   04/06/21 0830 04/06/21 0930 04/06/21 1030 04/06/21 1130  BP: 112/88 129/86 (!) 142/87 140/80  Pulse: 88 88 88 90  Resp: 16 15 17  (!) 24  Temp:      TempSrc:      SpO2: 92% 92% 94% 98%  Weight:        Intake/Output Summary (Last 24 hours) at 04/06/2021 1233 Last data filed at 04/05/2021 1835 Gross per 24 hour  Intake 1000 ml  Output --  Net 1000 ml   Filed Weights   04/04/21 2312  Weight: 55 kg    Examination:  General exam: Appears calm and comfortable  Respiratory system: Clear to auscultation. Respiratory effort normal. No respiratory distress. No conversational dyspnea.  Cardiovascular system: S1 & S2 heard, RRR. No murmurs. No pedal edema. Gastrointestinal system: Abdomen is nondistended, soft and nontender. Normal bowel sounds heard. Central nervous system: Alert and oriented. No focal neurological deficits. Speech clear.  Extremities: Symmetric in appearance  Skin: No rashes, lesions or ulcers on exposed skin  Psychiatry: Judgement and insight appear normal. Mood & affect appropriate.   Data Reviewed: I  have personally reviewed following labs and imaging studies  CBC: Recent Labs  Lab 04/04/21 2347 04/05/21 0739 04/06/21 0634  WBC 11.7* 11.3* 7.1  NEUTROABS 7.6  --   --   HGB 14.4 14.6 14.9  HCT 46.2* 47.1* 48.6*  MCV 91.1 90.2 91.5  PLT 195 173 170   Basic Metabolic Panel: Recent Labs  Lab 04/04/21 2347 04/05/21 0739 04/05/21 1921 04/06/21 0634  NA 133* 134* 137 139  K 4.2 4.9  3.9 4.6  CL 102 105 109 110  CO2 24 25 23 24   GLUCOSE 422* 452* 91 100*  BUN 24* 16 15 9   CREATININE 1.72* 0.89 0.74 0.50  CALCIUM 8.9 9.0 8.2* 9.2  MG  --   --  1.7  --    GFR: Estimated Creatinine Clearance: 56.5 mL/min (by C-G formula based on SCr of 0.5 mg/dL). Liver Function Tests: Recent Labs  Lab 04/04/21 2347 04/05/21 1921  AST 22 67*  ALT 45* 58*  ALKPHOS 113 103  BILITOT 0.7 0.4  PROT 6.5 5.7*  ALBUMIN 3.7 2.9*   No results for input(s): LIPASE, AMYLASE in the last 168 hours. Recent Labs  Lab 04/05/21 0034  AMMONIA 46*   Coagulation Profile: No results for input(s): INR, PROTIME in the last 168 hours. Cardiac Enzymes: No results for input(s): CKTOTAL, CKMB, CKMBINDEX, TROPONINI in the last 168 hours. BNP (last 3 results) No results for input(s): PROBNP in the last 8760 hours. HbA1C: No results for input(s): HGBA1C in the last 72 hours. CBG: Recent Labs  Lab 04/05/21 1717 04/05/21 1831 04/05/21 2153 04/06/21 0734 04/06/21 1131  GLUCAP 86 94 118* 120* 260*   Lipid Profile: No results for input(s): CHOL, HDL, LDLCALC, TRIG, CHOLHDL, LDLDIRECT in the last 72 hours. Thyroid Function Tests: No results for input(s): TSH, T4TOTAL, FREET4, T3FREE, THYROIDAB in the last 72 hours. Anemia Panel: No results for input(s): VITAMINB12, FOLATE, FERRITIN, TIBC, IRON, RETICCTPCT in the last 72 hours. Sepsis Labs: Recent Labs  Lab 04/04/21 2347 04/05/21 0145  LATICACIDVEN 1.7 1.3    Recent Results (from the past 240 hour(s))  Culture, blood (routine x 2)     Status: None (Preliminary result)   Collection Time: 04/04/21 11:47 PM   Specimen: BLOOD  Result Value Ref Range Status   Specimen Description BLOOD LEFT HAND  Final   Special Requests   Final    BOTTLES DRAWN AEROBIC AND ANAEROBIC Blood Culture adequate volume   Culture   Final    NO GROWTH 1 DAY Performed at [facility], [address] [place], [address] [place]    Report Status PENDING   Incomplete  Resp Panel by RT-PCR (Flu A&B, Covid) Nasopharyngeal Swab     Status: None   Collection Time: 04/04/21 11:47 PM   Specimen: Nasopharyngeal Swab; Nasopharyngeal(NP) swabs in vial transport medium  Result Value Ref Range Status   SARS Coronavirus 2 by RT PCR NEGATIVE NEGATIVE Final    Comment: (NOTE) SARS-CoV-2 target nucleic acids are NOT DETECTED.  The SARS-CoV-2 RNA is generally detectable in upper respiratory specimens during the acute phase of infection. The lowest concentration of SARS-CoV-2 viral copies this assay can detect is 138 copies/mL. A negative result does not preclude SARS-Cov-2 infection and should not be used as the sole basis for treatment or other patient management decisions. A negative result may occur with  improper specimen collection/handling, submission of specimen other than nasopharyngeal swab, presence of viral mutation(s) within the areas targeted by this assay, and  inadequate number of viral copies(<138 copies/mL). A negative result must be combined with clinical observations, patient history, and epidemiological information. The expected result is Negative.  Fact Sheet for Patients:  BloggerCourse.comhttps://www.fda.gov/media/152166/download  Fact Sheet for Healthcare Providers:  SeriousBroker.ithttps://www.fda.gov/media/152162/download  This test is no t yet approved or cleared by the [place] FDA and  has been authorized for detection and/or diagnosis of SARS-CoV-2 by FDA under an Emergency Use Authorization (EUA). This EUA will remain  in effect (meaning this test can be used) for the duration of the COVID-19 declaration under Section 564(b)(1) of the Act, 21 U.S.C.section 360bbb-3(b)(1), unless the authorization is terminated  or revoked sooner.       Influenza A by PCR NEGATIVE NEGATIVE Final   Influenza B by PCR NEGATIVE NEGATIVE Final    Comment: (NOTE) The Xpert Xpress SARS-CoV-2/FLU/RSV plus assay is intended as an aid in the diagnosis of influenza  from Nasopharyngeal swab specimens and should not be used as a sole basis for treatment. Nasal washings and aspirates are unacceptable for Xpert Xpress SARS-CoV-2/FLU/RSV testing.  Fact Sheet for Patients: BloggerCourse.comhttps://www.fda.gov/media/152166/download  Fact Sheet for Healthcare Providers: SeriousBroker.ithttps://www.fda.gov/media/152162/download  This test is not yet approved or cleared by the [place] FDA and has been authorized for detection and/or diagnosis of SARS-CoV-2 by FDA under an Emergency Use Authorization (EUA). This EUA will remain in effect (meaning this test can be used) for the duration of the COVID-19 declaration under Section 564(b)(1) of the Act, 21 U.S.C. section 360bbb-3(b)(1), unless the authorization is terminated or revoked.  Performed at [facility], [address] [place], [place] [address]   Culture, blood (Routine X 2) w Reflex to ID Panel     Status: None (Preliminary result)   Collection Time: 04/05/21  1:45 AM   Specimen: BLOOD  Result Value Ref Range Status   Specimen Description BLOOD RIGHT HAND  Final   Special Requests   Final    BOTTLES DRAWN AEROBIC AND ANAEROBIC Blood Culture adequate volume   Culture   Final    NO GROWTH 1 DAY Performed at [facility], [address] [place], [place] [address]    Report Status PENDING  Incomplete      Radiology Studies: DG Chest Port 1 View  Result Date: 04/04/2021 CLINICAL DATA:  Altered mental status EXAM: PORTABLE CHEST 1 VIEW COMPARISON:  01/16/2021 FINDINGS: Low lung volumes augmenting the cardiomediastinal silhouette. Prominent central pulmonary vessels. Subsegmental atelectasis or scarring in the right lower lung. Aortic atherosclerosis. No pneumothorax. IMPRESSION: Low lung volumes with subsegmental atelectasis and or scarring in the lower lungs. Slightly enlarged cardiomediastinal silhouette likely augmented by low lung volume and portable technique. Electronically Signed   By: Jasmine PangKim   Fujinaga M.D.   On: 04/04/2021 23:44   CT HEAD CODE STROKE WO CONTRAST  Result Date: 04/04/2021 CLINICAL DATA:  Code stroke. Initial evaluation for neuro deficit, stroke suspected. EXAM: CT HEAD WITHOUT CONTRAST TECHNIQUE: Contiguous axial images were obtained from the base of the skull through the vertex without intravenous contrast. RADIATION DOSE REDUCTION: This exam was performed according to the departmental dose-optimization program which includes automated exposure control, adjustment of the mA and/or kV according to patient size and/or use of iterative reconstruction technique. COMPARISON:  Prior CT from 05/05/2013. FINDINGS: Brain: Cerebral volume within normal limits. Scattered patchy hypodensity involving the supratentorial cerebral white matter, nonspecific, but most likely related chronic microvascular ischemic disease. No acute intracranial hemorrhage. No acute large vessel territory infarct. No mass lesion or midline shift.  No hydrocephalus or extra-axial fluid collection. Vascular: No hyperdense vessel. Skull: Calvarium intact. Few scattered foci of soft tissue emphysema present at the right temporal and infratemporal region, suspected be related IV access. Scalp soft tissues demonstrate no other acute finding. Sinuses/Orbits: Globes and orbital soft tissues demonstrate no acute finding. Visualized paranasal sinuses and mastoid air cells are clear. Other: None. ASPECTS [facility]([place] Stroke Program Early CT Score) - Ganglionic level infarction (caudate, lentiform nuclei, internal capsule, insula, M1-M3 cortex): 7 - Supraganglionic infarction (M4-M6 cortex): 3 Total score (0-10 with 10 being normal): 10 IMPRESSION: 1. No acute intracranial abnormality. 2. ASPECTS is 10. 3. Patchy hypodensity involving the supratentorial cerebral white matter, nonspecific, but most likely related chronic microvascular ischemic disease. Results were called by telephone at the time of interpretation on 04/04/2021 at 11:43  pm to the emergency room physician, who verbally acknowledged these results. Electronically Signed   By: Rise MuBenjamin  McClintock M.D.   On: 04/04/2021 23:45      Scheduled Meds:  ARIPiprazole  5 mg Oral Daily   aspirin EC  81 mg Oral Daily   darifenacin  7.5 mg Oral Daily   dicyclomine  10 mg Oral QID   enoxaparin (LOVENOX) injection  30 mg Subcutaneous Q24H   famotidine  40 mg Oral QHS   insulin aspart  0-20 Units Subcutaneous TID AC & HS   insulin glargine-yfgn  30 Units Subcutaneous BID   linaclotide  290 mcg Oral QAC breakfast   mometasone-formoterol  2 puff Inhalation BID   montelukast  10 mg Oral Daily   oxybutynin  5 mg Oral QHS   pantoprazole  40 mg Oral Daily   polyethylene glycol  17 g Oral Daily   rosuvastatin  20 mg Oral Daily   sucralfate  1 g Oral TID WC & HS   topiramate  25 mg Oral BID   traZODone  150 mg Oral QHS   venlafaxine XR  150 mg Oral Daily   Continuous Infusions:  sodium chloride 100 mL/hr at 04/06/21 0833     LOS: 1 day     Noralee StainJennifer Teshia Mahone, DO [facility] 04/06/2021, 12:33 PM   Available via Epic secure chat 7am-7pm After these hours, please refer to coverage provider listed on [email or web address]

## 2021-04-06 NOTE — ED Notes (Signed)
Pt brief and sheets changed at this time °

## 2021-04-06 NOTE — ED Notes (Signed)
Patient repositioned in stretcher. New gown given at this time.

## 2021-04-07 LAB — HEPATIC FUNCTION PANEL
ALT: 51 U/L — ABNORMAL HIGH (ref 0–44)
AST: 21 U/L (ref 15–41)
Albumin: 3.1 g/dL — ABNORMAL LOW (ref 3.5–5.0)
Alkaline Phosphatase: 128 U/L — ABNORMAL HIGH (ref 38–126)
Bilirubin, Direct: <0.1 mg/dL (ref 0.0–0.2)
Total Bilirubin: 0.6 mg/dL (ref 0.3–1.2)
Total Protein: 6.1 g/dL — ABNORMAL LOW (ref 6.5–8.1)

## 2021-04-07 LAB — GLUCOSE, CAPILLARY
Glucose-Capillary: 147 mg/dL — ABNORMAL HIGH (ref 70–99)
Glucose-Capillary: 183 mg/dL — ABNORMAL HIGH (ref 70–99)

## 2021-04-07 MED ORDER — LOSARTAN POTASSIUM 50 MG PO TABS: 50.0000 mg | ORAL_TABLET | Freq: Every day | ORAL | 0 refills | Status: DC

## 2021-04-07 MED ORDER — INSULIN ASPART PROT & ASPART (70-30 MIX) 100 UNIT/ML PEN: 30.0000 [IU] | PEN_INJECTOR | Freq: Two times a day (BID) | SUBCUTANEOUS | 1 refills | Status: DC

## 2021-04-07 MED ORDER — INSULIN PEN NEEDLE 31G X 5 MM MISC
1.0000 [IU] | Freq: Two times a day (BID) | 0 refills | Status: DC
Start: 2021-04-07 — End: 2023-06-10

## 2021-04-07 NOTE — Discharge Summary (Signed)
Physician Discharge Summary  [name]:[id number] DOB: October 12, 1960 DOA: 04/04/2021  PCP: Sharyne Peach, MD  Admit date: 04/04/2021 Discharge date: 04/07/2021  Admitted From: Home Disposition:  Home   Recommendations for Outpatient Follow-up:  Follow up with PCP in 1 week Home health RN ordered to assist with medication management   Discharge Condition: Stable CODE STATUS: Full  Diet recommendation: Carb modified   Brief/Interim Summary: [name] is a 61 y.o. female with medical history significant for asthma, OSA on CPAP, type 2 diabetes mellitus, hypertension, dyslipidemia, GERD and depression, who presented to the emergency room with acute onset of altered mental status with decreased responsiveness.  The patient was last seen in at 10 PM on Tuesday by her daughter and her neighbor noticed that she was not out during the day for a while but eventually came to the door to let the neighbor in.  The neighbor called EMS due to altered mental status.  She stated that her house was a mess and look like things had gotten knocked over.  She was fairly somnolent upon presentation to the ER.  She was started on Narcan drip with improvement in her mentation.  Hospitalization further complicated by hypotension which has improved with IV fluid.  Patient is also had hyperglycemia and then hypoglycemia.  She met with diabetic coordinator and our insulin regimen was adjusted.  On day of discharge, patient was alert, oriented, back to her baseline.  She was not able to tell me what medication she takes or the reasons for these medications.  She is also having difficulty managing insulin at home.  Home health RN was ordered to help with medications.  Discharge Diagnoses:  Principal Problem:   Opiate overdose, accidental or unintentional, sequela Active Problems:   Essential hypertension   Chronic pain syndrome   Hyperlipidemia, mixed   Type 2 diabetes mellitus without  complications (HCC)   Anxiety   Opioid overdose (HCC)  Acute toxic encephalopathy secondary to accidental opioid overdose, polypharmacy  -Status post Narcan drip -Improved and back to her baseline -Stop atarax   Hypotension -Improved with IV fluid, and holding home antihypertensives -Resume home antihypertensive at lower dose   DM type 2 with hyperglycemia  -Novolog ordered    Elevated liver enzyme -Improved    Hyperactive bladder -Continue enablex, Ditropan    HLD -Continue Crestor    Mood disorder -Continue Abilify, Effexor, Trazodone   Migraine -Continue topamax, propranolol   Discharge Instructions  Discharge Instructions     Call MD for:  difficulty breathing, headache or visual disturbances   Complete by: As directed    Call MD for:  extreme fatigue   Complete by: As directed    Call MD for:  persistant dizziness or light-headedness   Complete by: As directed    Call MD for:  persistant nausea and vomiting   Complete by: As directed    Call MD for:  severe uncontrolled pain   Complete by: As directed    Call MD for:  temperature >100.4   Complete by: As directed    Discharge instructions   Complete by: As directed    You were cared for by a hospitalist during your hospital stay. If you have any questions about your discharge medications or the care you received while you were in the hospital after you are discharged, you can call the unit and ask to speak with the hospitalist on call if the hospitalist that took care of you is  not available. Once you are discharged, your primary care physician will handle any further medical issues. Please note that NO REFILLS for any discharge medications will be authorized once you are discharged, as it is imperative that you return to your primary care physician (or establish a relationship with a primary care physician if you do not have one) for your aftercare needs so that they can reassess your need for medications and  monitor your lab values.   Increase activity slowly   Complete by: As directed       Allergies as of 04/07/2021       Reactions   Tramadol Itching   Headaches and confusion.   Benadryl [diphenhydramine Hcl] Itching   Bextra [valdecoxib] Itching   Invokana [canagliflozin] Other (See Comments)   Causes yeast infection   Sulfamethoxazole-trimethoprim Other (See Comments)   "Makes me feel weird"        Medication List     STOP taking these medications    amLODipine 10 MG tablet Commonly known as: NORVASC   Dexlansoprazole 30 MG capsule DR   dicyclomine 10 MG capsule Commonly known as: Bentyl   fexofenadine 180 MG tablet Commonly known as: ALLEGRA   hydrOXYzine 50 MG tablet Commonly known as: ATARAX   insulin regular 100 units/mL injection Commonly known as: NOVOLIN R   Levemir FlexTouch 100 UNIT/ML FlexPen Generic drug: insulin detemir   linaclotide 290 MCG Caps capsule Commonly known as: LINZESS   loperamide 2 MG tablet Commonly known as: IMODIUM A-D   polyethylene glycol 17 g packet Commonly known as: MIRALAX / GLYCOLAX   pregabalin 100 MG capsule Commonly known as: Lyrica   solifenacin 10 MG tablet Commonly known as: VESICARE   sucralfate 1 g tablet Commonly known as: CARAFATE   testosterone cypionate 200 MG/ML injection Commonly known as: DEPOTESTOSTERONE CYPIONATE   tolterodine 2 MG tablet Commonly known as: DETROL   triamcinolone cream 0.1 % Commonly known as: KENALOG       TAKE these medications    Accu-Chek Guide test strip Generic drug: glucose blood USE TID   albuterol 108 (90 Base) MCG/ACT inhaler Commonly known as: VENTOLIN HFA Inhale 1 puff into the lungs 2 (two) times daily as needed.   Alive Once Daily Womens Tabs Take 1 tablet by mouth daily.   ARIPiprazole 5 MG tablet Commonly known as: ABILIFY Take 5 mg by mouth daily.   aspirin 81 MG tablet Take 81 mg by mouth daily.   budesonide-formoterol 160-4.5 MCG/ACT  inhaler Commonly known as: SYMBICORT Inhale 2 puffs into the lungs 2 (two) times daily.   cetirizine 10 MG tablet Commonly known as: ZYRTEC Take by mouth.   ergocalciferol 1.25 MG (50000 UT) capsule Commonly known as: VITAMIN D2 TAKE 1 CAPSULE BY MOUTH 1 TIME A WEEK   famotidine 40 MG tablet Commonly known as: PEPCID Take 20 mg by mouth daily.   HYDROcodone-acetaminophen 7.5-325 MG tablet Commonly known as: Norco Take 1 tablet by mouth every 8 (eight) hours as needed for severe pain. Must last 30 days. What changed: Another medication with the same name was removed. Continue taking this medication, and follow the directions you see here.   ibuprofen 800 MG tablet Commonly known as: ADVIL Take 800 mg by mouth every 6 (six) hours as needed.   insulin aspart protamine - aspart (70-30) 100 UNIT/ML FlexPen Commonly known as: NOVOLOG 70/30 MIX Inject 30 Units into the skin 2 (two) times daily with a meal.   Insulin Pen Needle  31G X 5 MM Misc 1 Units by Does not apply route in the morning and at bedtime.   losartan 50 MG tablet Commonly known as: COZAAR Take 1 tablet (50 mg total) by mouth daily. What changed:  medication strength how much to take   metFORMIN 1000 MG tablet Commonly known as: GLUCOPHAGE Take 1,000 mg by mouth 2 (two) times daily.   mirtazapine 45 MG tablet Commonly known as: REMERON Take 45 mg by mouth daily.   montelukast 10 MG tablet Commonly known as: SINGULAIR Take 1 tablet by mouth daily.   ondansetron 4 MG tablet Commonly known as: ZOFRAN Take 4 mg by mouth every 8 (eight) hours as needed for nausea or vomiting.   propranolol 40 MG tablet Commonly known as: INDERAL Take 40 mg by mouth 2 (two) times daily.   rizatriptan 10 MG tablet Commonly known as: MAXALT Take 10 mg by mouth as needed for migraine. May repeat in 2 hours if needed   rosuvastatin 20 MG tablet Commonly known as: CRESTOR Take 20 mg by mouth daily.   topiramate 25 MG  tablet Commonly known as: TOPAMAX Take 25 mg by mouth 2 (two) times daily.   traZODone 150 MG tablet Commonly known as: DESYREL Take 150 mg by mouth at bedtime. What changed: Another medication with the same name was removed. Continue taking this medication, and follow the directions you see here.   venlafaxine XR 75 MG 24 hr capsule Commonly known as: EFFEXOR-XR Take 75 mg by mouth daily with breakfast. What changed: Another medication with the same name was removed. Continue taking this medication, and follow the directions you see here.   venlafaxine XR 150 MG 24 hr capsule Commonly known as: EFFEXOR-XR Take 150 mg by mouth daily. What changed: Another medication with the same name was removed. Continue taking this medication, and follow the directions you see here.        Follow-up Information     Sharyne Peach, MD. Schedule an appointment as soon as possible for a visit in 1 week(s).   Specialty: Family Medicine Contact information: [place] [address] [phone]                Allergies  Allergen Reactions   Tramadol Itching    Headaches and confusion.   Benadryl [Diphenhydramine Hcl] Itching   Bextra [Valdecoxib] Itching   Invokana [Canagliflozin] Other (See Comments)    Causes yeast infection   Sulfamethoxazole-Trimethoprim Other (See Comments)    "Makes me feel weird"      Procedures/Studies: DG Chest Port 1 View  Result Date: 04/04/2021 CLINICAL DATA:  Altered mental status EXAM: PORTABLE CHEST 1 VIEW COMPARISON:  01/16/2021 FINDINGS: Low lung volumes augmenting the cardiomediastinal silhouette. Prominent central pulmonary vessels. Subsegmental atelectasis or scarring in the right lower lung. Aortic atherosclerosis. No pneumothorax. IMPRESSION: Low lung volumes with subsegmental atelectasis and or scarring in the lower lungs. Slightly enlarged cardiomediastinal silhouette likely augmented by low lung volume and portable  technique. Electronically Signed   By: Donavan Foil M.D.   On: 04/04/2021 23:44   CT HEAD CODE STROKE WO CONTRAST  Result Date: 04/04/2021 CLINICAL DATA:  Code stroke. Initial evaluation for neuro deficit, stroke suspected. EXAM: CT HEAD WITHOUT CONTRAST TECHNIQUE: Contiguous axial images were obtained from the base of the skull through the vertex without intravenous contrast. RADIATION DOSE REDUCTION: This exam was performed according to the departmental dose-optimization program which includes automated exposure control, adjustment of the mA and/or  kV according to patient size and/or use of iterative reconstruction technique. COMPARISON:  Prior CT from 05/05/2013. FINDINGS: Brain: Cerebral volume within normal limits. Scattered patchy hypodensity involving the supratentorial cerebral white matter, nonspecific, but most likely related chronic microvascular ischemic disease. No acute intracranial hemorrhage. No acute large vessel territory infarct. No mass lesion or midline shift. No hydrocephalus or extra-axial fluid collection. Vascular: No hyperdense vessel. Skull: Calvarium intact. Few scattered foci of soft tissue emphysema present at the right temporal and infratemporal region, suspected be related IV access. Scalp soft tissues demonstrate no other acute finding. Sinuses/Orbits: Globes and orbital soft tissues demonstrate no acute finding. Visualized paranasal sinuses and mastoid air cells are clear. Other: None. ASPECTS [facility] Stroke Program Early CT Score) - Ganglionic level infarction (caudate, lentiform nuclei, internal capsule, insula, M1-M3 cortex): 7 - Supraganglionic infarction (M4-M6 cortex): 3 Total score (0-10 with 10 being normal): 10 IMPRESSION: 1. No acute intracranial abnormality. 2. ASPECTS is 10. 3. Patchy hypodensity involving the supratentorial cerebral white matter, nonspecific, but most likely related chronic microvascular ischemic disease. Results were called by telephone at the  time of interpretation on 04/04/2021 at 11:43 pm to the emergency room physician, who verbally acknowledged these results. Electronically Signed   By: Jeannine Boga M.D.   On: 04/04/2021 23:45       Discharge Exam: Vitals:   04/07/21 0447 04/07/21 0808  BP: (!) 153/97 (!) 130/96  Pulse: 95 93  Resp: 16 (!) 9  Temp: 98.3 F (36.8 C) 98.7 F (37.1 C)  SpO2: 94% 97%    General: Pt is alert, awake, not in acute distress Cardiovascular: RRR, S1/S2 +, no edema Respiratory: CTA bilaterally, no wheezing, no rhonchi, no respiratory distress, no conversational dyspnea  Abdominal: Soft, NT, ND, bowel sounds + Extremities: no edema, no cyanosis Psych: Normal mood and affect, stable judgement and insight     The results of significant diagnostics from this hospitalization (including imaging, microbiology, ancillary and laboratory) are listed below for reference.     Microbiology: Recent Results (from the past 240 hour(s))  Culture, blood (routine x 2)     Status: None (Preliminary result)   Collection Time: 04/04/21 11:47 PM   Specimen: BLOOD  Result Value Ref Range Status   Specimen Description BLOOD LEFT HAND  Final   Special Requests   Final    BOTTLES DRAWN AEROBIC AND ANAEROBIC Blood Culture adequate volume   Culture   Final    NO GROWTH 2 DAYS Performed at [facility], [address] [place], [place] [address]    Report Status PENDING  Incomplete  Resp Panel by RT-PCR (Flu A&B, Covid) Nasopharyngeal Swab     Status: None   Collection Time: 04/04/21 11:47 PM   Specimen: Nasopharyngeal Swab; Nasopharyngeal(NP) swabs in vial transport medium  Result Value Ref Range Status   SARS Coronavirus 2 by RT PCR NEGATIVE NEGATIVE Final    Comment: (NOTE) SARS-CoV-2 target nucleic acids are NOT DETECTED.  The SARS-CoV-2 RNA is generally detectable in upper respiratory specimens during the acute phase of infection. The lowest concentration of SARS-CoV-2 viral copies  this assay can detect is 138 copies/mL. A negative result does not preclude SARS-Cov-2 infection and should not be used as the sole basis for treatment or other patient management decisions. A negative result may occur with  improper specimen collection/handling, submission of specimen other than nasopharyngeal swab, presence of viral mutation(s) within the areas targeted by this assay, and inadequate number of viral copies(<138 copies/mL).  A negative result must be combined with clinical observations, patient history, and epidemiological information. The expected result is Negative.  Fact Sheet for Patients:  EntrepreneurPulse.com.au  Fact Sheet for Healthcare Providers:  IncredibleEmployment.be  This test is no t yet approved or cleared by the [place] FDA and  has been authorized for detection and/or diagnosis of SARS-CoV-2 by FDA under an Emergency Use Authorization (EUA). This EUA will remain  in effect (meaning this test can be used) for the duration of the COVID-19 declaration under Section 564(b)(1) of the Act, 21 U.S.C.section 360bbb-3(b)(1), unless the authorization is terminated  or revoked sooner.       Influenza A by PCR NEGATIVE NEGATIVE Final   Influenza B by PCR NEGATIVE NEGATIVE Final    Comment: (NOTE) The Xpert Xpress SARS-CoV-2/FLU/RSV plus assay is intended as an aid in the diagnosis of influenza from Nasopharyngeal swab specimens and should not be used as a sole basis for treatment. Nasal washings and aspirates are unacceptable for Xpert Xpress SARS-CoV-2/FLU/RSV testing.  Fact Sheet for Patients: EntrepreneurPulse.com.au  Fact Sheet for Healthcare Providers: IncredibleEmployment.be  This test is not yet approved or cleared by the [place] FDA and has been authorized for detection and/or diagnosis of SARS-CoV-2 by FDA under an Emergency Use Authorization (EUA). This EUA  will remain in effect (meaning this test can be used) for the duration of the COVID-19 declaration under Section 564(b)(1) of the Act, 21 U.S.C. section 360bbb-3(b)(1), unless the authorization is terminated or revoked.  Performed at [facility], [address] [place], [place] [address]   Culture, blood (Routine X 2) w Reflex to ID Panel     Status: None (Preliminary result)   Collection Time: 04/05/21  1:45 AM   Specimen: BLOOD  Result Value Ref Range Status   Specimen Description BLOOD RIGHT HAND  Final   Special Requests   Final    BOTTLES DRAWN AEROBIC AND ANAEROBIC Blood Culture adequate volume   Culture   Final    NO GROWTH 2 DAYS Performed at [facility], [address], [place], [place] [address]    Report Status PENDING  Incomplete     Labs: BNP (last 3 results) No results for input(s): BNP in the last 8760 hours. Basic Metabolic Panel: Recent Labs  Lab 04/04/21 2347 04/05/21 0739 04/05/21 1921 04/06/21 0634  NA 133* 134* 137 139  K 4.2 4.9 3.9 4.6  CL 102 105 109 110  CO2 _0 GLUCOSE 422* 452* 91 100*  BUN 24* _1 CREATININE 1.72* 0.89 0.74 0.50  CALCIUM 8.9 9.0 8.2* 9.2  MG  --   --  1.7  --    Liver Function Tests: Recent Labs  Lab 04/04/21 2347 04/05/21 1921 04/07/21 0512  AST 22 67* 21  ALT 45* 58* 51*  ALKPHOS 113 103 128*  BILITOT 0.7 0.4 0.6  PROT 6.5 5.7* 6.1*  ALBUMIN 3.7 2.9* 3.1*   No results for input(s): LIPASE, AMYLASE in the last 168 hours. Recent Labs  Lab 04/05/21 0034  AMMONIA 46*   CBC: Recent Labs  Lab 04/04/21 2347 04/05/21 0739 04/06/21 0634  WBC 11.7* 11.3* 7.1  NEUTROABS 7.6  --   --   HGB 14.4 14.6 14.9  HCT 46.2* 47.1* 48.6*  MCV 91.1 90.2 91.5  PLT 195 173 170   Cardiac Enzymes: No results for input(s): CKTOTAL, CKMB, CKMBINDEX, TROPONINI in the last 168 hours. BNP: Invalid input(s): POCBNP CBG: Recent  Labs  Lab 04/06/21 0734 04/06/21 1131 04/06/21 1721  04/06/21 2010 04/07/21 0907  GLUCAP 120* 260* 179* 204* 147*   D-Dimer No results for input(s): DDIMER in the last 72 hours. Hgb A1c Recent Labs    04/05/21 0739  HGBA1C >15.5*   Lipid Profile No results for input(s): CHOL, HDL, LDLCALC, TRIG, CHOLHDL, LDLDIRECT in the last 72 hours. Thyroid function studies No results for input(s): TSH, T4TOTAL, T3FREE, THYROIDAB in the last 72 hours.  Invalid input(s): FREET3 Anemia work up No results for input(s): VITAMINB12, FOLATE, FERRITIN, TIBC, IRON, RETICCTPCT in the last 72 hours. Urinalysis    Component Value Date/Time   COLORURINE YELLOW 04/04/2021 2347   APPEARANCEUR HAZY (A) 04/04/2021 2347   APPEARANCEUR Clear 05/31/2015 1229   LABSPEC 1.016 04/04/2021 2347   LABSPEC 1.025 12/03/2013 2207   PHURINE 5.0 04/04/2021 2347   GLUCOSEU >=500 (A) 04/04/2021 2347   GLUCOSEU >=500 12/03/2013 2207   HGBUR NEGATIVE 04/04/2021 2347   BILIRUBINUR NEGATIVE 04/04/2021 2347   BILIRUBINUR Negative 05/31/2015 1229   BILIRUBINUR Negative 12/03/2013 2207   KETONESUR NEGATIVE 04/04/2021 2347   PROTEINUR 30 (A) 04/04/2021 2347   UROBILINOGEN 0.2 05/11/2007 0952   NITRITE NEGATIVE 04/04/2021 2347   LEUKOCYTESUR NEGATIVE 04/04/2021 2347   LEUKOCYTESUR Negative 12/03/2013 2207   Sepsis Labs Invalid input(s): PROCALCITONIN,  WBC,  LACTICIDVEN Microbiology Recent Results (from the past 240 hour(s))  Culture, blood (routine x 2)     Status: None (Preliminary result)   Collection Time: 04/04/21 11:47 PM   Specimen: BLOOD  Result Value Ref Range Status   Specimen Description BLOOD LEFT HAND  Final   Special Requests   Final    BOTTLES DRAWN AEROBIC AND ANAEROBIC Blood Culture adequate volume   Culture   Final    NO GROWTH 2 DAYS Performed at [facility], [address], [place], [place] [address]    Report Status PENDING  Incomplete  Resp Panel by RT-PCR (Flu A&B, Covid) Nasopharyngeal Swab     Status: None   Collection Time:  04/04/21 11:47 PM   Specimen: Nasopharyngeal Swab; Nasopharyngeal(NP) swabs in vial transport medium  Result Value Ref Range Status   SARS Coronavirus 2 by RT PCR NEGATIVE NEGATIVE Final    Comment: (NOTE) SARS-CoV-2 target nucleic acids are NOT DETECTED.  The SARS-CoV-2 RNA is generally detectable in upper respiratory specimens during the acute phase of infection. The lowest concentration of SARS-CoV-2 viral copies this assay can detect is 138 copies/mL. A negative result does not preclude SARS-Cov-2 infection and should not be used as the sole basis for treatment or other patient management decisions. A negative result may occur with  improper specimen collection/handling, submission of specimen other than nasopharyngeal swab, presence of viral mutation(s) within the areas targeted by this assay, and inadequate number of viral copies(<138 copies/mL). A negative result must be combined with clinical observations, patient history, and epidemiological information. The expected result is Negative.  Fact Sheet for Patients:  EntrepreneurPulse.com.au  Fact Sheet for Healthcare Providers:  IncredibleEmployment.be  This test is no t yet approved or cleared by the [place] FDA and  has been authorized for detection and/or diagnosis of SARS-CoV-2 by FDA under an Emergency Use Authorization (EUA). This EUA will remain  in effect (meaning this test can be used) for the duration of the COVID-19 declaration under Section 564(b)(1) of the Act, 21 U.S.C.section 360bbb-3(b)(1), unless the authorization is terminated  or revoked sooner.       Influenza A  by PCR NEGATIVE NEGATIVE Final   Influenza B by PCR NEGATIVE NEGATIVE Final    Comment: (NOTE) The Xpert Xpress SARS-CoV-2/FLU/RSV plus assay is intended as an aid in the diagnosis of influenza from Nasopharyngeal swab specimens and should not be used as a sole basis for treatment. Nasal washings  and aspirates are unacceptable for Xpert Xpress SARS-CoV-2/FLU/RSV testing.  Fact Sheet for Patients: EntrepreneurPulse.com.au  Fact Sheet for Healthcare Providers: IncredibleEmployment.be  This test is not yet approved or cleared by the [place] FDA and has been authorized for detection and/or diagnosis of SARS-CoV-2 by FDA under an Emergency Use Authorization (EUA). This EUA will remain in effect (meaning this test can be used) for the duration of the COVID-19 declaration under Section 564(b)(1) of the Act, 21 U.S.C. section 360bbb-3(b)(1), unless the authorization is terminated or revoked.  Performed at [facility], [address] [place], [place] [address]   Culture, blood (Routine X 2) w Reflex to ID Panel     Status: None (Preliminary result)   Collection Time: 04/05/21  1:45 AM   Specimen: BLOOD  Result Value Ref Range Status   Specimen Description BLOOD RIGHT HAND  Final   Special Requests   Final    BOTTLES DRAWN AEROBIC AND ANAEROBIC Blood Culture adequate volume   Culture   Final    NO GROWTH 2 DAYS Performed at [facility], [address] [place], [place] [address]    Report Status PENDING  Incomplete     Patient was seen and examined on the day of discharge and was found to be in stable condition. Time coordinating discharge: 25 minutes including assessment and coordination of care, as well as examination of the patient.   SIGNED:  Dessa Phi, DO [facility] 04/07/2021, 11:03 AM

## 2021-04-07 NOTE — TOC Transition Note (Signed)
Transition of Care [facility]) - CM/SW Discharge Note   Patient Details  Name: [name] MRN: [id number] Date of Birth: Dec 29, 1960  Transition of [facility] CM/SW Contact:  Maud Deed, LCSW Phone Number: 04/07/2021, 12:10 PM   Clinical Narrative:    Pt medically stable for discharge per DO. Pt will be transported home by her daughter. CSW arranged HH RN with [facility].    Final next level of care: Home w Home Health Services Barriers to Discharge: No Barriers Identified   Patient Goals and CMS Choice   CMS Medicare.gov Compare Post Acute Care list provided to:: Patient Choice offered to / list presented to : Patient  Discharge Placement                  Name of family member notified: Christina Patient and family notified of of transfer: 04/07/21  Discharge Plan and Services                          HH Arranged: RN [facility] Agency: [facility] Date HH Agency Contacted: 04/07/21 Time HH Agency Contacted: 1143 Representative spoke with at [facility] Agency: Barbara Cower  Social Determinants of Health (SDOH) Interventions     Readmission Risk Interventions No flowsheet data found.

## 2021-04-10 LAB — CULTURE, BLOOD (ROUTINE X 2)
Culture: NO GROWTH
Culture: NO GROWTH
Special Requests: ADEQUATE
Special Requests: ADEQUATE

## 2021-05-29 ENCOUNTER — Encounter: Payer: Medicaid Other | Admitting: Student in an Organized Health Care Education/Training Program

## 2021-06-04 ENCOUNTER — Other Ambulatory Visit: Payer: Self-pay | Admitting: Family Medicine

## 2021-06-04 DIAGNOSIS — Z1231 Encounter for screening mammogram for malignant neoplasm of breast: Secondary | ICD-10-CM

## 2021-06-12 ENCOUNTER — Encounter: Payer: Medicaid Other | Admitting: Student in an Organized Health Care Education/Training Program

## 2021-06-13 ENCOUNTER — Encounter: Payer: Medicaid Other | Admitting: Internal Medicine

## 2021-06-13 ENCOUNTER — Other Ambulatory Visit: Payer: Medicaid Other

## 2021-06-13 ENCOUNTER — Encounter: Payer: Self-pay | Admitting: *Deleted

## 2021-06-19 DIAGNOSIS — D61818 Other pancytopenia: Secondary | ICD-10-CM | POA: Insufficient documentation

## 2021-06-19 NOTE — Progress Notes (Signed)
?[facility]  ?[phone] [phone] ? ?ID: [name] OB: 03-30-1960  MR#: [id number]  KHT#:[id number] ? ?Patient Care Team: ?Dan Humphreys, MD as PCP - General (Family Medicine) ?Jeralyn Ruths, MD as Consulting Physician (Oncology) ? ?CHIEF COMPLAINT: Leukocytosis and polycythemia. ? ?INTERVAL HISTORY: Patient is a 61 year old female who was noted to have an elevated white blood cell count and red blood cell count on routine blood work.  She is referred for further evaluation.  She currently feels well and is asymptomatic.  Patient states she recently was on antibiotics for sinus infection, but this is since resolved.  She has no other new medications.  She has no neurologic complaints.  She has good appetite and denies weight loss.  She has no chest pain, shortness of breath, cough, or hemoptysis.  She denies any nausea, vomiting, constipation, or diarrhea.  She has no urinary complaints.  Patient feels at her baseline offers no further specific complaints today. ? ?REVIEW OF SYSTEMS:   ?Review of Systems  ?Constitutional: Negative.  Negative for fever, malaise/fatigue and weight loss.  ?Respiratory: Negative.  Negative for cough, hemoptysis and shortness of breath.   ?Cardiovascular: Negative.  Negative for chest pain and leg swelling.  ?Gastrointestinal: Negative.  Negative for abdominal pain.  ?Genitourinary: Negative.  Negative for dysuria.  ?Musculoskeletal: Negative.  Negative for back pain.  ?Skin: Negative.  Negative for rash.  ?Neurological: Negative.  Negative for dizziness, focal weakness, weakness and headaches.  ?Psychiatric/Behavioral: Negative.  The patient is not nervous/anxious.   ? ?As per HPI. Otherwise, a complete review of systems is negative. ? ?PAST MEDICAL HISTORY: ?Past Medical History:  ?Diagnosis Date  ? Abnormal CBC   ? Asthma   ? Asthma   ? BPPV (benign paroxysmal positional vertigo)   ? none recently  ? Cardiac murmur,  unspecified 08/28/2016  ? CPAP (continuous positive airway pressure) dependence   ? Degenerative disc disease, lumbar   ? Depression   ? Diabetes mellitus   ? Diverticulitis 10/2016  ? Elevated LFTs   ? Fatty liver   ? Fibromyalgia   ? Fibromyalgia   ? GERD (gastroesophageal reflux disease)   ? Headache   ? migraines.  None since starting topamax  ? History of degenerative disc disease   ? Hyperlipidemia   ? Hypertension   ? Sleep apnea   ? CPAP  ? ? ?PAST SURGICAL HISTORY: ?Past Surgical History:  ?Procedure Laterality Date  ? ABDOMINAL HYSTERECTOMY    ? ABDOMINAL SURGERY    ? CATARACT EXTRACTION W/PHACO Left 09/07/2019  ? Procedure: CATARACT EXTRACTION PHACO AND INTRAOCULAR LENS PLACEMENT (IOC) LEFT DIABETIC;  Surgeon: Galen Manila, MD;  Location: [facility];  Service: Ophthalmology;  Laterality: Left;  4.01 ?0:43.8  ? CATARACT EXTRACTION W/PHACO Right 09/28/2019  ? Procedure: CATARACT EXTRACTION PHACO AND INTRAOCULAR LENS PLACEMENT (IOC) RIGHT DIABETIC 4.16  00:43.3;  Surgeon: Galen Manila, MD;  Location: [facility];  Service: Ophthalmology;  Laterality: Right;  Diabetic - insulin and oral meds  ? CHOLECYSTECTOMY N/A 04/27/2018  ? Procedure: LAPAROSCOPIC CHOLECYSTECTOMY;  Surgeon: Carolan Shiver, MD;  Location: [facility] ORS;  Service: General;  Laterality: N/A;  ? COLONOSCOPY WITH PROPOFOL N/A 02/20/2017  ? Procedure: COLONOSCOPY WITH PROPOFOL;  Surgeon: Christena Deem, MD;  Location: [facility] ENDOSCOPY;  Service: Endoscopy;  Laterality: N/A;  ? Diverticulitis    ? ESOPHAGOGASTRODUODENOSCOPY (EGD) WITH PROPOFOL N/A 02/20/2017  ? Procedure: ESOPHAGOGASTRODUODENOSCOPY (EGD) WITH PROPOFOL;  Surgeon: Christena Deem, MD;  Location: [facility] ENDOSCOPY;  Service: Endoscopy;  Laterality: N/A;  ? EUS N/A 01/08/2018  ? Procedure: UPPER ENDOSCOPIC ULTRASOUND (EUS) RADIAL;  Surgeon: Bearl Mulberry, MD;  Location: [facility] ENDOSCOPY;  Service: Gastroenterology;  Laterality: N/A;  ? UPPER ESOPHAGEAL  ENDOSCOPIC ULTRASOUND (EUS) N/A 04/03/2017  ? Procedure: UPPER ESOPHAGEAL ENDOSCOPIC ULTRASOUND (EUS);  Surgeon: Bearl Mulberry, MD;  Location: [facility] ENDOSCOPY;  Service: Gastroenterology;  Laterality: N/A;  ? ? ?FAMILY HISTORY: ?Family History  ?Problem Relation Age of Onset  ? Diabetes Mother   ? Hypertension Mother   ? ? ?ADVANCED DIRECTIVES (Y/N):  N ? ?HEALTH MAINTENANCE: ?Social History  ? ?Tobacco Use  ? Smoking status: Every Day  ?  Packs/day: 0.50  ?  Years: 14.00  ?  Pack years: 7.00  ?  Types: Cigarettes  ?  Passive exposure: Current  ? Smokeless tobacco: Former  ? Tobacco comments:  ?  had recently quit for about 1 yr.  restarted several months ago  ?Vaping Use  ? Vaping Use: Former  ?Substance Use Topics  ? Alcohol use: Yes  ?  Alcohol/week: 2.0 standard drinks  ?  Types: 1 Glasses of wine, 1 Cans of beer per week  ? Drug use: Not Currently  ?  Comment: 4 years ago used Crack  ? ? ? Colonoscopy: ? PAP: ? Bone density: ? Lipid panel: ? ?Allergies  ?Allergen Reactions  ? Tramadol Itching  ?  Headaches and confusion.  ? Benadryl [Diphenhydramine Hcl] Itching  ? Bextra [Valdecoxib] Itching  ? Invokana [Canagliflozin] Other (See Comments)  ?  Causes yeast infection  ? Sulfamethoxazole-Trimethoprim Other (See Comments)  ?  "Makes me feel weird"  ? ? ?Current Outpatient Medications  ?Medication Sig Dispense Refill  ? albuterol (PROVENTIL HFA;VENTOLIN HFA) 108 (90 Base) MCG/ACT inhaler Inhale 1 puff into the lungs 2 (two) times daily as needed.    ? ARIPiprazole (ABILIFY) 5 MG tablet Take 5 mg by mouth daily.    ? aspirin 81 MG tablet Take 81 mg by mouth daily.     ? budesonide-formoterol (SYMBICORT) 160-4.5 MCG/ACT inhaler Inhale 2 puffs into the lungs 2 (two) times daily.    ? ergocalciferol (VITAMIN D2) 1.25 MG (50000 UT) capsule TAKE 1 CAPSULE BY MOUTH 1 TIME A WEEK    ? famotidine (PEPCID) 40 MG tablet Take 20 mg by mouth daily.    ? ibuprofen (ADVIL) 800 MG tablet Take 800 mg by mouth every 6 (six)  hours as needed.    ? insulin aspart protamine - aspart (NOVOLOG 70/30 MIX) (70-30) 100 UNIT/ML FlexPen Inject 30 Units into the skin 2 (two) times daily with a meal. 15 mL 1  ? Insulin Pen Needle 31G X 5 MM MISC 1 Units by Does not apply route in the morning and at bedtime. 100 each 0  ? losartan (COZAAR) 50 MG tablet Take 1 tablet (50 mg total) by mouth daily. 30 tablet 0  ? metFORMIN (GLUCOPHAGE) 1000 MG tablet Take 1,000 mg by mouth 2 (two) times daily.     ? mirtazapine (REMERON) 45 MG tablet Take 45 mg by mouth daily.    ? montelukast (SINGULAIR) 10 MG tablet Take 1 tablet by mouth daily.    ? ondansetron (ZOFRAN) 4 MG tablet Take 4 mg by mouth every 8 (eight) hours as needed for nausea or vomiting.  0  ? propranolol (INDERAL) 40 MG tablet Take 40 mg by mouth 2 (two) times daily.    ? rizatriptan (MAXALT) 10  MG tablet Take 10 mg by mouth as needed for migraine. May repeat in 2 hours if needed    ? rosuvastatin (CRESTOR) 20 MG tablet Take 20 mg by mouth daily.    ? topiramate (TOPAMAX) 25 MG tablet Take 25 mg by mouth 2 (two) times daily.    ? traZODone (DESYREL) 150 MG tablet Take 150 mg by mouth at bedtime.    ? venlafaxine XR (EFFEXOR-XR) 150 MG 24 hr capsule Take 150 mg by mouth daily.    ? venlafaxine XR (EFFEXOR-XR) 75 MG 24 hr capsule Take 75 mg by mouth daily with breakfast.    ? ACCU-CHEK GUIDE test strip USE TID  12  ? cetirizine (ZYRTEC) 10 MG tablet Take by mouth.    ? Multiple Vitamins-Minerals (ALIVE ONCE DAILY WOMENS) TABS Take 1 tablet by mouth daily.  (Patient not taking: Reported on 12/05/2020)    ? ?No current facility-administered medications for this visit.  ? ? ?OBJECTIVE: ?Vitals:  ? 06/21/21 1325  ?BP: (!) 159/109  ?Pulse: (!) 110  ?Temp: (!) 97.2 ?F (36.2 ?C)  ?SpO2: 92%  ?   Body mass index is 24.91 kg/m?Marland Kitchen.    ECOG FS:0 - Asymptomatic ? ?General: Well-developed, well-nourished, no acute distress. ?Eyes: Pink conjunctiva, anicteric sclera. ?HEENT: Normocephalic, moist mucous  membranes. ?Lungs: No audible wheezing or coughing. ?Heart: Regular rate and rhythm. ?Abdomen: Soft, nontender, no obvious distention. ?Musculoskeletal: No edema, cyanosis, or clubbing. ?Neuro: Alert, answering all questi

## 2021-06-20 ENCOUNTER — Ambulatory Visit: Payer: Medicaid Other

## 2021-06-21 ENCOUNTER — Inpatient Hospital Stay: Payer: Medicaid Other | Attending: Internal Medicine | Admitting: Oncology

## 2021-06-21 ENCOUNTER — Encounter: Payer: Self-pay | Admitting: Oncology

## 2021-06-21 ENCOUNTER — Inpatient Hospital Stay: Payer: Medicaid Other

## 2021-06-21 DIAGNOSIS — D61818 Other pancytopenia: Secondary | ICD-10-CM

## 2021-06-21 DIAGNOSIS — D751 Secondary polycythemia: Secondary | ICD-10-CM

## 2021-06-21 DIAGNOSIS — F17210 Nicotine dependence, cigarettes, uncomplicated: Secondary | ICD-10-CM | POA: Insufficient documentation

## 2021-06-21 DIAGNOSIS — D72829 Elevated white blood cell count, unspecified: Secondary | ICD-10-CM | POA: Diagnosis not present

## 2021-06-21 DIAGNOSIS — Z79899 Other long term (current) drug therapy: Secondary | ICD-10-CM | POA: Insufficient documentation

## 2021-06-21 LAB — CBC
HCT: 46.7 % — ABNORMAL HIGH (ref 36.0–46.0)
Hemoglobin: 15.4 g/dL — ABNORMAL HIGH (ref 12.0–15.0)
MCH: 28.4 pg (ref 26.0–34.0)
MCHC: 33 g/dL (ref 30.0–36.0)
MCV: 86.2 fL (ref 80.0–100.0)
Platelets: 164 10*3/uL (ref 150–400)
RBC: 5.42 MIL/uL — ABNORMAL HIGH (ref 3.87–5.11)
RDW: 13.3 % (ref 11.5–15.5)
WBC: 9.4 10*3/uL (ref 4.0–10.5)
nRBC: 0 % (ref 0.0–0.2)

## 2021-06-21 LAB — IRON AND TIBC
Iron: 110 ug/dL (ref 28–170)
Saturation Ratios: 29 % (ref 10.4–31.8)
TIBC: 377 ug/dL (ref 250–450)
UIBC: 267 ug/dL

## 2021-06-21 LAB — FERRITIN: Ferritin: 184 ng/mL (ref 11–307)

## 2021-06-22 LAB — ERYTHROPOIETIN: Erythropoietin: 31.3 m[IU]/mL — ABNORMAL HIGH (ref 2.6–18.5)

## 2021-06-22 LAB — CARBON MONOXIDE, BLOOD (PERFORMED AT REF LAB): Carbon Monoxide, Blood: 9.6 % — ABNORMAL HIGH (ref 0.0–3.6)

## 2021-06-25 ENCOUNTER — Ambulatory Visit (INDEPENDENT_AMBULATORY_CARE_PROVIDER_SITE_OTHER): Payer: Medicaid Other | Admitting: Internal Medicine

## 2021-06-25 VITALS — BP 140/86 | HR 104 | Resp 16 | Ht 59.0 in | Wt 120.0 lb

## 2021-06-25 DIAGNOSIS — I10 Essential (primary) hypertension: Secondary | ICD-10-CM

## 2021-06-25 DIAGNOSIS — Z9989 Dependence on other enabling machines and devices: Secondary | ICD-10-CM | POA: Diagnosis not present

## 2021-06-25 DIAGNOSIS — Z7189 Other specified counseling: Secondary | ICD-10-CM | POA: Diagnosis not present

## 2021-06-25 DIAGNOSIS — G4733 Obstructive sleep apnea (adult) (pediatric): Secondary | ICD-10-CM | POA: Diagnosis not present

## 2021-06-25 LAB — JAK2 GENOTYPR

## 2021-06-25 LAB — COMP PANEL: LEUKEMIA/LYMPHOMA

## 2021-06-25 NOTE — Progress Notes (Signed)
[facility] ?[address] ?[place], [place] [id number] ? ?Pulmonary Sleep Medicine  ? ?Office Visit Note ? ?Patient Name: Erika MeyerJanice R Cook ?DOB: Apr 29, 1960 ?MRN [id number] ? ? ? ?Chief Complaint: Obstructive Sleep Apnea visit ? ?Brief History: ? ?Erika NixonJanice is seen today for follow up consult The patient has a 3 year history of sleep apnea. Patient is not using PAP nightly with small F20 full face mask.  The patient feels better after sleeping with PAP.  The patient reports benefiting from PAP when she uses it. Epworth Sleepiness Score is 0 out of 24. The patient does not take naps. The patient complains of the following: face breaks out without mask liners & still has insomnia.   The compliance download shows  compliance with an average use time of 1 minutes/hour @ 0%. The AHI is 2.4  The patient does not complain of limb movements disrupting sleep. ? ?ROS ? ?General: (-) fever, (-) chills, (-) night sweat ?Nose and Sinuses: (-) nasal stuffiness or itchiness, (-) postnasal drip, (-) nosebleeds, (-) sinus trouble. ?Mouth and Throat: (-) sore throat, (-) hoarseness. ?Neck: (-) swollen glands, (-) enlarged thyroid, (-) neck pain. ?Respiratory: - cough, - shortness of breath, - wheezing. ?Neurologic: - numbness, - tingling. ?Psychiatric: + anxiety, + depression ? ? ?Current Medication: ?Outpatient Encounter Medications as of 06/25/2021  ?Medication Sig Note  ? losartan (COZAAR) 100 MG tablet TAKE 1 TABLET(100 MG) BY MOUTH EVERY DAY   ? pregabalin (LYRICA) 100 MG capsule Take 1 capsule by mouth 2 (two) times daily.   ? solifenacin (VESICARE) 10 MG tablet Take 1 tablet by mouth daily.   ? ACCU-CHEK GUIDE test strip USE TID   ? albuterol (PROVENTIL HFA;VENTOLIN HFA) 108 (90 Base) MCG/ACT inhaler Inhale 1 puff into the lungs 2 (two) times daily as needed.   ? ARIPiprazole (ABILIFY) 5 MG tablet Take 5 mg by mouth daily. 04/05/2021: LF 03/20/2021 15 MG TABS (disp 30, 30d supply)   ? aspirin 81 MG tablet Take 81 mg  by mouth daily.    ? budesonide-formoterol (SYMBICORT) 160-4.5 MCG/ACT inhaler Inhale 2 puffs into the lungs 2 (two) times daily.   ? cetirizine (ZYRTEC) 10 MG tablet Take by mouth. 04/05/2021: LF 01/17/2021 10 MG TABS (disp 90, 90d supply)   ? DULERA 200-5 MCG/ACT AERO Inhale 2 puffs into the lungs 2 (two) times daily.   ? ergocalciferol (VITAMIN D2) 1.25 MG (50000 UT) capsule TAKE 1 CAPSULE BY MOUTH 1 TIME A WEEK 04/05/2021: LF 01/23/2021 1.25 MG (50000 UNIT) CAPS (disp 12, 84d supply)   ? famotidine (PEPCID) 40 MG tablet Take 20 mg by mouth daily. 04/05/2021: LF 01/17/2021 20 MG TABS (disp 180, 90d supply)   ? fluticasone (FLONASE) 50 MCG/ACT nasal spray Place 2 sprays into both nostrils daily.   ? HYDROcodone-acetaminophen (NORCO) 7.5-325 MG tablet Take 1 tablet by mouth every 8 (eight) hours as needed.   ? ibuprofen (ADVIL) 800 MG tablet Take 800 mg by mouth every 6 (six) hours as needed.   ? insulin aspart protamine - aspart (NOVOLOG 70/30 MIX) (70-30) 100 UNIT/ML FlexPen Inject 30 Units into the skin 2 (two) times daily with a meal.   ? Insulin Pen Needle 31G X 5 MM MISC 1 Units by Does not apply route in the morning and at bedtime.   ? LINZESS 290 MCG CAPS capsule Take 290 mcg by mouth daily.   ? losartan (COZAAR) 50 MG tablet Take 1 tablet (50 mg total) by mouth daily.   ?  meclizine (ANTIVERT) 25 MG tablet Take by mouth.   ? metFORMIN (GLUCOPHAGE) 1000 MG tablet Take 1,000 mg by mouth 2 (two) times daily.  04/05/2021: LF 01/17/2021 1000 MG TABS (disp 180, 90d supply   ? mirtazapine (REMERON) 45 MG tablet Take 45 mg by mouth daily. 04/05/2021: LF 01/17/2021 45 MG TABS (disp 90, 90d supply)   ? montelukast (SINGULAIR) 10 MG tablet Take 1 tablet by mouth daily. 04/05/2021: LF 01/17/2021 10 MG TABS (disp 90, 90d supply)   ? Multiple Vitamins-Minerals (ALIVE ONCE DAILY WOMENS) TABS Take 1 tablet by mouth daily.  (Patient not taking: Reported on 12/05/2020)   ? ondansetron (ZOFRAN) 4 MG tablet Take 4 mg by mouth every 8  (eight) hours as needed for nausea or vomiting.   ? propranolol (INDERAL) 40 MG tablet Take 40 mg by mouth 2 (two) times daily. 04/05/2021: LF 03/29/2021 60 MG TABS (disp 180, 90d supply)   ? rizatriptan (MAXALT) 10 MG tablet Take 10 mg by mouth as needed for migraine. May repeat in 2 hours if needed   ? rosuvastatin (CRESTOR) 20 MG tablet Take 20 mg by mouth daily. 04/05/2021: LF 01/17/2021 20 MG TABS (disp 90, 90d supply)   ? topiramate (TOPAMAX) 25 MG tablet Take 25 mg by mouth 2 (two) times daily. 04/05/2021: LF 08/02/2020 25 MG TABS (disp 180, 90d supply)   ? traZODone (DESYREL) 150 MG tablet Take 150 mg by mouth at bedtime. 04/05/2021: LF 01/17/2021 150 MG TABS (disp 90, 90d supply)   ? venlafaxine XR (EFFEXOR-XR) 150 MG 24 hr capsule Take 150 mg by mouth daily. 04/05/2021: LF 03/20/2021 150 MG CP24 (disp 30, 30d supply)  ?  ? venlafaxine XR (EFFEXOR-XR) 75 MG 24 hr capsule Take 75 mg by mouth daily with breakfast. 04/05/2021: LF 03/20/2021 75 MG CP24 (disp 30, 30d supply)   ? ?No facility-administered encounter medications on file as of 06/25/2021.  ? ? ?Surgical History: ?Past Surgical History:  ?Procedure Laterality Date  ? ABDOMINAL HYSTERECTOMY    ? ABDOMINAL SURGERY    ? CATARACT EXTRACTION W/PHACO Left 09/07/2019  ? Procedure: CATARACT EXTRACTION PHACO AND INTRAOCULAR LENS PLACEMENT (IOC) LEFT DIABETIC;  Surgeon: Galen Manila, MD;  Location: [facility];  Service: Ophthalmology;  Laterality: Left;  4.01 ?0:43.8  ? CATARACT EXTRACTION W/PHACO Right 09/28/2019  ? Procedure: CATARACT EXTRACTION PHACO AND INTRAOCULAR LENS PLACEMENT (IOC) RIGHT DIABETIC 4.16  00:43.3;  Surgeon: Galen Manila, MD;  Location: [facility];  Service: Ophthalmology;  Laterality: Right;  Diabetic - insulin and oral meds  ? CHOLECYSTECTOMY N/A 04/27/2018  ? Procedure: LAPAROSCOPIC CHOLECYSTECTOMY;  Surgeon: Carolan Shiver, MD;  Location: [facility] ORS;  Service: General;  Laterality: N/A;  ? COLONOSCOPY WITH PROPOFOL  N/A 02/20/2017  ? Procedure: COLONOSCOPY WITH PROPOFOL;  Surgeon: Christena Deem, MD;  Location: [facility] ENDOSCOPY;  Service: Endoscopy;  Laterality: N/A;  ? Diverticulitis    ? ESOPHAGOGASTRODUODENOSCOPY (EGD) WITH PROPOFOL N/A 02/20/2017  ? Procedure: ESOPHAGOGASTRODUODENOSCOPY (EGD) WITH PROPOFOL;  Surgeon: Christena Deem, MD;  Location: [facility] ENDOSCOPY;  Service: Endoscopy;  Laterality: N/A;  ? EUS N/A 01/08/2018  ? Procedure: UPPER ENDOSCOPIC ULTRASOUND (EUS) RADIAL;  Surgeon: Bearl Mulberry, MD;  Location: [facility] ENDOSCOPY;  Service: Gastroenterology;  Laterality: N/A;  ? UPPER ESOPHAGEAL ENDOSCOPIC ULTRASOUND (EUS) N/A 04/03/2017  ? Procedure: UPPER ESOPHAGEAL ENDOSCOPIC ULTRASOUND (EUS);  Surgeon: Bearl Mulberry, MD;  Location: [facility] END[facility]COPY;  Service: Gastroenterology;  Laterality: N/A;  ? ? ?Medical History: ?Past Medical History:  ?Diagnosis Date  ?  Abnormal CBC   ? Asthma   ? Asthma   ? BPPV (benign paroxysmal positional vertigo)   ? none recently  ? Cardiac murmur, unspecified 08/28/2016  ? CPAP (continuous positive airway pressure) dependence   ? Degenerative disc disease, lumbar   ? Depression   ? Diabetes mellitus   ? Diverticulitis 10/2016  ? Elevated LFTs   ? Fatty liver   ? Fibromyalgia   ? Fibromyalgia   ? GERD (gastroesophageal reflux disease)   ? Headache   ? migraines.  None since starting topamax  ? History of degenerative disc disease   ? Hyperlipidemia   ? Hypertension   ? Sleep apnea   ? CPAP  ? ? ?Family History: ?Non contributory to the present illness ? ?Social History: ?Social History  ? ?Socioeconomic History  ? Marital status: Divorced  ?  Spouse name: Not on file  ? Number of children: Not on file  ? Years of education: Not on file  ? Highest education level: Not on file  ?Occupational History  ? Not on file  ?Tobacco Use  ? Smoking status: Every Day  ?  Packs/day: 0.50  ?  Years: 14.00  ?  Pack years: 7.00  ?  Types: Cigarettes  ?  Passive exposure: Current  ?  Smokeless tobacco: Former  ? Tobacco comments:  ?  had recently quit for about 1 yr.  restarted several months ago  ?Vaping Use  ? Vaping Use: Former  ?Substance and Sexual Activity  ? Alcohol use: Yes  ?  A

## 2021-06-25 NOTE — Patient Instructions (Signed)

## 2021-06-26 ENCOUNTER — Encounter: Payer: Self-pay | Admitting: Student in an Organized Health Care Education/Training Program

## 2021-06-26 ENCOUNTER — Ambulatory Visit
Payer: Medicaid Other | Attending: Student in an Organized Health Care Education/Training Program | Admitting: Student in an Organized Health Care Education/Training Program

## 2021-06-26 VITALS — BP 139/94 | HR 86 | Temp 96.9°F | Ht 59.0 in | Wt 120.0 lb

## 2021-06-26 DIAGNOSIS — M7918 Myalgia, other site: Secondary | ICD-10-CM | POA: Diagnosis present

## 2021-06-26 DIAGNOSIS — M5416 Radiculopathy, lumbar region: Secondary | ICD-10-CM | POA: Diagnosis present

## 2021-06-26 DIAGNOSIS — M1712 Unilateral primary osteoarthritis, left knee: Secondary | ICD-10-CM

## 2021-06-26 DIAGNOSIS — M1711 Unilateral primary osteoarthritis, right knee: Secondary | ICD-10-CM | POA: Diagnosis present

## 2021-06-26 DIAGNOSIS — G894 Chronic pain syndrome: Secondary | ICD-10-CM

## 2021-06-26 DIAGNOSIS — G8929 Other chronic pain: Secondary | ICD-10-CM

## 2021-06-26 DIAGNOSIS — F1190 Opioid use, unspecified, uncomplicated: Secondary | ICD-10-CM | POA: Diagnosis present

## 2021-06-26 MED ORDER — HYDROCODONE-ACETAMINOPHEN 7.5-325 MG PO TABS: 1.0000 | ORAL_TABLET | Freq: Three times a day (TID) | ORAL | 0 refills | Status: DC | PRN

## 2021-06-26 MED ORDER — PREGABALIN 100 MG PO CAPS: 100.0000 mg | ORAL_CAPSULE | Freq: Three times a day (TID) | ORAL | 2 refills | Status: DC

## 2021-06-26 NOTE — Progress Notes (Signed)
PROVIDER NOTE: Information contained herein reflects review and annotations entered in association with encounter. Interpretation of such information and data should be left to medically-trained personnel. Information provided to patient can be located elsewhere in the medical record under "Patient Instructions". Document created using STT-dictation technology, any transcriptional errors that may result from process are unintentional.  ?  ?Patient: [name]  Service Category: E/M  Provider: Gillis Santa, MD  ?DOB: February 02, 1961  DOS: 06/26/2021  Specialty: Interventional Pain Management  ?MRN: [id number]  Setting: Ambulatory outpatient  PCP: Margarita Rana, MD  ?Type: Established Patient    Referring Provider: Sharyne Peach, MD  ?Location: Office  Delivery: Face-to-face    ? ?HPI  ?[name], a 61 y.o. year old female, is here today because of her Primary osteoarthritis of right knee [M17.11]. [name]'s primary complain today is Back Pain (lower) ? ?Last encounter: My last encounter with her was on 03/06/21 ? ?Pertinent problems: [name] has Lumbar radiculopathy; Chronic bilateral low back pain with bilateral sciatica; Chronic pain syndrome; Chronic, continuous use of opioids; Chronic myofascial pain; Primary osteoarthritis of both hips; Type 2 diabetes mellitus without complications ([place]); and Pain in both hands on their pertinent problem list. ?Pain Assessment: Severity of Chronic pain is reported as a 7 /10. Location: Back Right, Left/pain radiaities down both leg to her feet. Onset: More than a month ago. Quality: Aching, Constant. Timing: Constant. Modifying factor(s): meds. ?Vitals:  height is 4\' 11"  (1.499 m) and weight is 120 lb (54.4 kg). Her temperature is 96.9 ?F (36.1 ?C) (abnormal). Her blood pressure is 139/94 (abnormal) and her pulse is 86. Her oxygen saturation is 95%.  ? ?Reason for encounter:  ? ?No change in medical history since last visit.  Patient's pain  is at baseline.  Patient continues multimodal pain regimen as prescribed.  States that it provides pain relief and improvement in functional status. ?Is finding benefit with Lyrica and is requesting dose escalation.  Kidney function within normal limits.  No side effects of daytime drowsiness or sedation.  Recommend dose increase to 100 mg 3 times daily. ?We we will also renew our annual urine toxicology screen today ? ?Pharmacotherapy Assessment  ?Analgesic: Norco 7.5 mg TID PRN #90/month  ? ?Monitoring: ?[place] PMP: PDMP reviewed during this encounter.       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 ?Effectiveness: Clinically acceptable. ? ?Chauncey Fischer, RN  06/26/2021  8:17 AM  Sign when Signing Visit ?Nursing Pain Medication Assessment:  ?Safety precautions to be maintained throughout the outpatient stay will include: orient to surroundings, keep bed in low position, maintain call bell within reach at all times, provide assistance with transfer out of bed and ambulation.  ?Medication Inspection Compliance: Pill count conducted under aseptic conditions, in front of the patient. Neither the pills nor the bottle was removed from the patient's sight at any time. Once count was completed pills were immediately returned to the patient in their original bottle. ? ?Medication: Hydrocodone/APAP ?Pill/Patch Count:  0 of 90 pills remain ?Pill/Patch Appearance: Markings consistent with prescribed medication ?Bottle Appearance: Standard pharmacy container. Clearly labeled. ?Filled Date: 3 / 2 / 2023 ?Last Medication intake:  YesterdaySafety precautions to be maintained throughout the outpatient stay will include: orient to surroundings, keep bed in low position, maintain call bell within reach at all times, provide assistance with transfer out of bed and ambulation.  ?  UDS:  ?Summary  ?Date Value Ref Range Status  ?  03/07/2020 Note  Final  ?  Comment:  ?   ==================================================================== ?[employer] Select 13 (MW) ?==================================================================== ?Test                             Result       Flag       Units ? ?Drug Present and Declared for Prescription Verification ?  Hydrocodone                    276          EXPECTED   ng/mg creat ?  Hydromorphone                  159          EXPECTED   ng/mg creat ?  Dihydrocodeine                 180          EXPECTED   ng/mg creat ?  Norhydrocodone                 523          EXPECTED   ng/mg creat ?   Sources of hydrocodone include scheduled prescription medications. ?   Hydromorphone, dihydrocodeine and norhydrocodone are expected ?   metabolites of hydrocodone. Hydromorphone and dihydrocodeine are ?   also available as scheduled prescription medications. ? ?==================================================================== ?Test                      Result    Flag   Units      Ref Range ?  Creatinine              74               mg/dL      >=20 ?==================================================================== ?Declared Medications: ? The flagging and interpretation on this report are based on the ? following declared medications.  Unexpected results may arise from ? inaccuracies in the declared medications. ? ? **Note: The testing scope of this panel includes these medications: ? ? Hydrocodone (Norco) ? ? **Note: The testing scope of this panel does not include the ? following reported medications: ? ? Acetaminophen (Norco) ? Albuterol (Ventolin HFA) ? Aspirin ? Budesonide (Symbicort) ? Dexlansoprazole ? Famotidine (Pepcid) ? Fesoterodine (Toviaz) ? Fexofenadine (Allegra) ? Fluticasone (Flonase) ? Formoterol (Symbicort) ? Insulin (Levemir) ? Iron ? Linaclotide (Linzess) ? Losartan (Cozaar) ? Magnesium ? Metformin ? Mirtazapine (Remeron) ? Multivitamin ? Ondansetron (Zofran) ? Polyethylene Glycol (MiraLAX) ? Pregabalin (Lyrica) ? Rizatriptan  (Maxalt) ? Rosuvastatin (Crestor) ? Solifenacin (Vesicare) ? Topiramate (Topamax) ? Trazodone (Desyrel) ? Venlafaxine (Effexor) ?==================================================================== ?For clinical consultation, please call [phone]. ?==================================================================== ?  ?  ? ?ROS  ?Constitutional: Denies any fever or chills ?Gastrointestinal: No reported hemesis, hematochezia, vomiting, or acute GI distress ?Musculoskeletal:  +LBP ?Neurological: No reported episodes of acute onset apraxia, aphasia, dysarthria, agnosia, amnesia, paralysis, loss of coordination, or loss of consciousness ? ?Medication Review  ?ARIPiprazole, Alive Once Daily Womens, HYDROcodone-acetaminophen, Insulin Pen Needle, albuterol, aspirin, budesonide-formoterol, cetirizine, ergocalciferol, famotidine, fluticasone, glucose blood, ibuprofen, insulin aspart protamine - aspart, linaclotide, losartan, meclizine, metFORMIN, mirtazapine, mometasone-formoterol, montelukast, ondansetron, pregabalin, propranolol, rizatriptan, rosuvastatin, solifenacin, topiramate, traZODone, and venlafaxine XR ? ?History Review  ?Allergy: Ms. Wilder is allergic to tramadol, benadryl [diphenhydramine hcl], bextra [valdecoxib], invokana [canagliflozin], and sulfamethoxazole-trimethoprim. ?Drug: Ms. Peppin  reports that she does not currently use  drugs. ?Alcohol:  reports current alcohol use of about 2.0 standard drinks per week. ?Tobacco:  reports that she has been smoking cigarettes. She has a 7.00 pack-year smoking history. She has been exposed to tobacco smoke. She has quit using smokeless tobacco. ?Social: Ms. Strohmeier  reports that she has been smoking cigarettes. She has a 7.00 pack-year smoking history. She has been exposed to tobacco smoke. She has quit using smokeless tobacco. She reports current alcohol use of about 2.0 standard drinks per week. She reports that she does not currently use  drugs. ?Medical:  has a past medical history of Abnormal CBC, Asthma, Asthma, BPPV (benign paroxysmal positional vertigo), Cardiac murmur, unspecified (08/28/2016), CPAP (continuous positive airway pressure) dependen

## 2021-06-26 NOTE — Progress Notes (Signed)
Nursing Pain Medication Assessment:  ?Safety precautions to be maintained throughout the outpatient stay will include: orient to surroundings, keep bed in low position, maintain call bell within reach at all times, provide assistance with transfer out of bed and ambulation.  ?Medication Inspection Compliance: Pill count conducted under aseptic conditions, in front of the patient. Neither the pills nor the bottle was removed from the patient's sight at any time. Once count was completed pills were immediately returned to the patient in their original bottle. ? ?Medication: Hydrocodone/APAP ?Pill/Patch Count:  0 of 90 pills remain ?Pill/Patch Appearance: Markings consistent with prescribed medication ?Bottle Appearance: Standard pharmacy container. Clearly labeled. ?Filled Date: 3 / 2 / 2023 ?Last Medication intake:  YesterdaySafety precautions to be maintained throughout the outpatient stay will include: orient to surroundings, keep bed in low position, maintain call bell within reach at all times, provide assistance with transfer out of bed and ambulation.  ?

## 2021-07-01 LAB — TOXASSURE SELECT 13 (MW), URINE

## 2021-07-09 NOTE — Progress Notes (Deleted)
[facility]  Telephone:(336) [phone] [phone]) [phone]  ID: Velia MeyerJanice R Voytko OB: 12-26-1960  MR#: [id number]  YSA#:[id number]#:[id number]  Patient Care Team: Dan HumphreysWaleh, Meshia Q, MD as PCP - General (Family Medicine) Jeralyn RuthsFinnegan, Keiera Strathman J, MD as Consulting Physician (Oncology)  I connected with Velia MeyerJanice R Liller on 07/09/21 at  2:00 PM EDT by {Blank single:19197::"video enabled telemedicine visit","telephone visit"} and verified that I am speaking with the correct person using two identifiers.   I discussed the limitations, risks, security and privacy concerns of performing an evaluation and management service by telemedicine and the availability of in-person appointments. I also discussed with the patient that there may be a patient responsible charge related to this service. The patient expressed understanding and agreed to proceed.   Other persons participating in the visit and their role in the encounter: Patient, MD.  Patient's location: Home. Provider's location: Clinic.  CHIEF COMPLAINT: Leukocytosis and polycythemia.  INTERVAL HISTORY: Patient is a 61 year old female who was noted to have an elevated white blood cell count and red blood cell count on routine blood work.  She is referred for further evaluation.  She currently feels well and is asymptomatic.  Patient states she recently was on antibiotics for sinus infection, but this is since resolved.  She has no other new medications.  She has no neurologic complaints.  She has good appetite and denies weight loss.  She has no chest pain, shortness of breath, cough, or hemoptysis.  She denies any nausea, vomiting, constipation, or diarrhea.  She has no urinary complaints.  Patient feels at her baseline offers no further specific complaints today.  REVIEW OF SYSTEMS:   Review of Systems  Constitutional: Negative.  Negative for fever, malaise/fatigue and weight loss.  Respiratory: Negative.  Negative for cough, hemoptysis  and shortness of breath.   Cardiovascular: Negative.  Negative for chest pain and leg swelling.  Gastrointestinal: Negative.  Negative for abdominal pain.  Genitourinary: Negative.  Negative for dysuria.  Musculoskeletal: Negative.  Negative for back pain.  Skin: Negative.  Negative for rash.  Neurological: Negative.  Negative for dizziness, focal weakness, weakness and headaches.  Psychiatric/Behavioral: Negative.  The patient is not nervous/anxious.    As per HPI. Otherwise, a complete review of systems is negative.  PAST MEDICAL HISTORY: Past Medical History:  Diagnosis Date   Abnormal CBC    Asthma    Asthma    BPPV (benign paroxysmal positional vertigo)    none recently   Cardiac murmur, unspecified 08/28/2016   CPAP (continuous positive airway pressure) dependence    Degenerative disc disease, lumbar    Depression    Diabetes mellitus    Diverticulitis 10/2016   Elevated LFTs    Fatty liver    Fibromyalgia    Fibromyalgia    GERD (gastroesophageal reflux disease)    Headache    migraines.  None since starting topamax   History of degenerative disc disease    Hyperlipidemia    Hypertension    Sleep apnea    CPAP    PAST SURGICAL HISTORY: Past Surgical History:  Procedure Laterality Date   ABDOMINAL HYSTERECTOMY     ABDOMINAL SURGERY     CATARACT EXTRACTION W/PHACO Left 09/07/2019   Procedure: CATARACT EXTRACTION PHACO AND INTRAOCULAR LENS PLACEMENT (IOC) LEFT DIABETIC;  Surgeon: Galen ManilaPorfilio, William, MD;  [facility];  Service: Ophthalmology;  Laterality: Left;  4.01 0:43.8   CATARACT EXTRACTION W/PHACO Right 09/28/2019   Procedure: CATARACT EXTRACTION PHACO AND INTRAOCULAR LENS PLACEMENT (  IOC) RIGHT DIABETIC 4.16  00:43.3;  Surgeon: Galen Manila, MD;  Location: [facility];  Service: Ophthalmology;  Laterality: Right;  Diabetic - insulin and oral meds   CHOLECYSTECTOMY N/A 04/27/2018   Procedure: LAPAROSCOPIC CHOLECYSTECTOMY;  Surgeon:  Carolan Shiver, MD;  Location: [facility];  Service: General;  Laterality: N/A;   COLONOSCOPY WITH PROPOFOL N/A 02/20/2017   Procedure: COLONOSCOPY WITH PROPOFOL;  Surgeon: Christena Deem, MD;  Location: [facility];  Service: Endoscopy;  Laterality: N/A;   Diverticulitis     ESOPHAGOGASTRODUODENOSCOPY (EGD) WITH PROPOFOL N/A 02/20/2017   Procedure: ESOPHAGOGASTRODUODENOSCOPY (EGD) WITH PROPOFOL;  Surgeon: Christena Deem, MD;  Location[facility];  Service: Endoscopy;  Laterality: N/A;   EUS N/A 01/08/2018   Procedure: UPPER ENDOSCOPIC ULTRASOUND (EUS) RADIAL;  Surgeon: Bearl Mulberry, MD;  Location: [facility] END[facility];  Service: Gastroenterology;  Laterality: N/A;   UPPER ESOPHAGEAL ENDOSCOPIC ULTRASOUND (EUS) N/A 04/03/2017   Procedure: UPPER ESOPHAGEAL ENDOSCOPIC ULTRASOUND (EUS);  Surgeon: Bearl Mulberry, MD;  Location: [facility] ENDOSCOPY;  Service: Gastroenterology;  Laterality: N/A;    FAMILY HISTORY: Family History  Problem Relation Age of Onset   Diabetes Mother    Hypertension Mother     ADVANCED DIRECTIVES (Y/N):  N  HEALTH MAINTENANCE: Social History   Tobacco Use   Smoking status: Every Day    Packs/day: 0.50    Years: 14.00    Pack years: 7.00    Types: Cigarettes    Passive exposure: Current   Smokeless tobacco: Former   Tobacco comments:    had recently quit for about 1 yr.  restarted several months ago  Vaping Use   Vaping Use: Former  Substance Use Topics   Alcohol use: Yes    Alcohol/week: 2.0 standard drinks    Types: 1 Glasses of wine, 1 Cans of beer per week   Drug use: Not Currently    Comment: 4 years ago used Crack     Colonoscopy:  PAP:  Bone density:  Lipid panel:  Allergies  Allergen Reactions   Tramadol Itching    Headaches and confusion.   Benadryl [Diphenhydramine Hcl] Itching   Bextra [Valdecoxib] Itching   Invokana [Canagliflozin] Other (See Comments)    Causes yeast infection    Sulfamethoxazole-Trimethoprim Other (See Comments)    "Makes me feel weird"    Current Outpatient Medications  Medication Sig Dispense Refill   ACCU-CHEK GUIDE test strip USE TID  12   albuterol (PROVENTIL HFA;VENTOLIN HFA) 108 (90 Base) MCG/ACT inhaler Inhale 1 puff into the lungs 2 (two) times daily as needed.     ARIPiprazole (ABILIFY) 5 MG tablet Take 5 mg by mouth daily.     aspirin 81 MG tablet Take 81 mg by mouth daily.      budesonide-formoterol (SYMBICORT) 160-4.5 MCG/ACT inhaler Inhale 2 puffs into the lungs 2 (two) times daily.     cetirizine (ZYRTEC) 10 MG tablet Take by mouth.     DULERA 200-5 MCG/ACT AERO Inhale 2 puffs into the lungs 2 (two) times daily.     ergocalciferol (VITAMIN D2) 1.25 MG (50000 UT) capsule TAKE 1 CAPSULE BY MOUTH 1 TIME A WEEK     famotidine (PEPCID) 40 MG tablet Take 20 mg by mouth daily.     fluticasone (FLONASE) 50 MCG/ACT nasal spray Place 2 sprays into both nostrils daily.     HYDROcodone-acetaminophen (NORCO) 7.5-325 MG tablet Take 1 tablet by mouth 3 (three) times daily as needed for severe pain. Must last 30  days 90 tablet 0   [START ON 07/26/2021] HYDROcodone-acetaminophen (NORCO) 7.5-325 MG tablet Take 1 tablet by mouth 3 (three) times daily as needed for severe pain. Must last 30 days 90 tablet 0   [START ON 08/25/2021] HYDROcodone-acetaminophen (NORCO) 7.5-325 MG tablet Take 1 tablet by mouth 3 (three) times daily as needed for severe pain. Must last 30 days 90 tablet 0   ibuprofen (ADVIL) 800 MG tablet Take 800 mg by mouth every 6 (six) hours as needed.     insulin aspart protamine - aspart (NOVOLOG 70/30 MIX) (70-30) 100 UNIT/ML FlexPen Inject 30 Units into the skin 2 (two) times daily with a meal. 15 mL 1   Insulin Pen Needle 31G X 5 MM MISC 1 Units by Does not apply route in the morning and at bedtime. 100 each 0   LINZESS 290 MCG CAPS capsule Take 290 mcg by mouth daily.     losartan (COZAAR) 100 MG tablet TAKE 1 TABLET(100 MG) BY MOUTH EVERY  DAY     losartan (COZAAR) 50 MG tablet Take 1 tablet (50 mg total) by mouth daily. 30 tablet 0   meclizine (ANTIVERT) 25 MG tablet Take by mouth.     metFORMIN (GLUCOPHAGE) 1000 MG tablet Take 1,000 mg by mouth 2 (two) times daily.      mirtazapine (REMERON) 45 MG tablet Take 45 mg by mouth daily.     montelukast (SINGULAIR) 10 MG tablet Take 1 tablet by mouth daily.     Multiple Vitamins-Minerals (ALIVE ONCE DAILY WOMENS) TABS Take 1 tablet by mouth daily.     ondansetron (ZOFRAN) 4 MG tablet Take 4 mg by mouth every 8 (eight) hours as needed for nausea or vomiting.  0   pregabalin (LYRICA) 100 MG capsule Take 1 capsule (100 mg total) by mouth 3 (three) times daily. 90 capsule 2   propranolol (INDERAL) 40 MG tablet Take 40 mg by mouth 2 (two) times daily.     rizatriptan (MAXALT) 10 MG tablet Take 10 mg by mouth as needed for migraine. May repeat in 2 hours if needed     rosuvastatin (CRESTOR) 20 MG tablet Take 20 mg by mouth daily.     solifenacin (VESICARE) 10 MG tablet Take 1 tablet by mouth daily.     topiramate (TOPAMAX) 25 MG tablet Take 25 mg by mouth 2 (two) times daily.     traZODone (DESYREL) 150 MG tablet Take 150 mg by mouth at bedtime.     venlafaxine XR (EFFEXOR-XR) 150 MG 24 hr capsule Take 150 mg by mouth daily.     venlafaxine XR (EFFEXOR-XR) 75 MG 24 hr capsule Take 75 mg by mouth daily with breakfast.     No current facility-administered medications for this visit.    OBJECTIVE: There were no vitals filed for this visit.    There is no height or weight on file to calculate BMI.    ECOG FS:0 - Asymptomatic  General: Well-developed, well-nourished, no acute distress. Eyes: Pink conjunctiva, anicteric sclera. HEENT: Normocephalic, moist mucous membranes. Lungs: No audible wheezing or coughing. Heart: Regular rate and rhythm. Abdomen: Soft, nontender, no obvious distention. Musculoskeletal: No edema, cyanosis, or clubbing. Neuro: Alert, answering all questions  appropriately. Cranial nerves grossly intact. Skin: No rashes or petechiae noted. Psych: Normal affect. Lymphatics: No cervical, calvicular, axillary or inguinal LAD.   LAB RESULTS:  Lab Results  Component Value Date   NA 139 04/06/2021   K 4.6 04/06/2021   CL 110 04/06/2021  CO2 24 04/06/2021   GLUCOSE 100 (H) 04/06/2021   BUN 9 04/06/2021   CREATININE 0.50 04/06/2021   CALCIUM 9.2 04/06/2021   PROT 6.1 (L) 04/07/2021   ALBUMIN 3.1 (L) 04/07/2021   AST 21 04/07/2021   ALT 51 (H) 04/07/2021   ALKPHOS 128 (H) 04/07/2021   BILITOT 0.6 04/07/2021   GFRNONAA >60 04/06/2021   GFRAA >60 08/09/2019    Lab Results  Component Value Date   WBC 9.4 06/21/2021   NEUTROABS 7.6 04/04/2021   HGB 15.4 (H) 06/21/2021   HCT 46.7 (H) 06/21/2021   MCV 86.2 06/21/2021   PLT 164 06/21/2021     STUDIES: No results found.  ASSESSMENT: Leukocytosis and polycythemia.  PLAN:    Leukocytosis: Resolved.  Patient's white blood cell count is 9.4 today.  Possibly reactive related to recent infection.  Peripheral blood flow cytometry was ordered for completeness and is pending at time of dictation. Polycythemia: Likely secondary to tobacco use.  Patient's hemoglobin is only mildly elevated at 15.4 today.  Iron stores are within normal limits.  Carbon monoxide levels are pending at time of dictation.  Have also ordered erythropoietin and JAK2 mutation for completeness.  No intervention is needed at this time.  Patient will have video-assisted telemedicine visit in 3 weeks for further evaluation and discussion of her results. Smoking cessation: Recommended patient to further discuss with primary care tactics to help quit.  I provided *** minutes of {Blank single:19197::"face-to-face video visit time","non face-to-face telephone visit time"} during this encounter which included chart review, counseling, and coordination of care as documented above.   Patient expressed understanding and was in  agreement with this plan. She also understands that She can call clinic at any time with any questions, concerns, or complaints.    Jeralyn Ruths, MD   07/09/2021 2:38 PM

## 2021-07-11 ENCOUNTER — Emergency Department: Payer: Medicaid Other

## 2021-07-11 ENCOUNTER — Other Ambulatory Visit: Payer: Self-pay

## 2021-07-11 ENCOUNTER — Inpatient Hospital Stay
Admission: EM | Admit: 2021-07-11 | Discharge: 2021-07-12 | DRG: 189 | Disposition: A | Discharge status: Home / Self Care | Payer: Medicaid Other | Attending: Internal Medicine | Admitting: Internal Medicine

## 2021-07-11 ENCOUNTER — Encounter: Payer: Self-pay | Admitting: Emergency Medicine

## 2021-07-11 DIAGNOSIS — Z794 Long term (current) use of insulin: Secondary | ICD-10-CM | POA: Diagnosis not present

## 2021-07-11 DIAGNOSIS — Z9989 Dependence on other enabling machines and devices: Secondary | ICD-10-CM

## 2021-07-11 DIAGNOSIS — J449 Chronic obstructive pulmonary disease, unspecified: Secondary | ICD-10-CM

## 2021-07-11 DIAGNOSIS — F17210 Nicotine dependence, cigarettes, uncomplicated: Secondary | ICD-10-CM | POA: Diagnosis present

## 2021-07-11 DIAGNOSIS — I10 Essential (primary) hypertension: Secondary | ICD-10-CM | POA: Diagnosis present

## 2021-07-11 DIAGNOSIS — H8110 Benign paroxysmal vertigo, unspecified ear: Secondary | ICD-10-CM | POA: Diagnosis present

## 2021-07-11 DIAGNOSIS — Z20822 Contact with and (suspected) exposure to covid-19: Secondary | ICD-10-CM | POA: Diagnosis present

## 2021-07-11 DIAGNOSIS — F418 Other specified anxiety disorders: Secondary | ICD-10-CM | POA: Diagnosis not present

## 2021-07-11 DIAGNOSIS — I272 Pulmonary hypertension, unspecified: Secondary | ICD-10-CM | POA: Diagnosis present

## 2021-07-11 DIAGNOSIS — F17200 Nicotine dependence, unspecified, uncomplicated: Secondary | ICD-10-CM | POA: Diagnosis present

## 2021-07-11 DIAGNOSIS — Z888 Allergy status to other drugs, medicaments and biological substances status: Secondary | ICD-10-CM | POA: Diagnosis not present

## 2021-07-11 DIAGNOSIS — M797 Fibromyalgia: Secondary | ICD-10-CM | POA: Diagnosis present

## 2021-07-11 DIAGNOSIS — G4733 Obstructive sleep apnea (adult) (pediatric): Secondary | ICD-10-CM | POA: Diagnosis present

## 2021-07-11 DIAGNOSIS — R109 Unspecified abdominal pain: Secondary | ICD-10-CM | POA: Diagnosis present

## 2021-07-11 DIAGNOSIS — E785 Hyperlipidemia, unspecified: Secondary | ICD-10-CM | POA: Diagnosis present

## 2021-07-11 DIAGNOSIS — J45909 Unspecified asthma, uncomplicated: Secondary | ICD-10-CM | POA: Diagnosis present

## 2021-07-11 DIAGNOSIS — E119 Type 2 diabetes mellitus without complications: Secondary | ICD-10-CM

## 2021-07-11 DIAGNOSIS — F419 Anxiety disorder, unspecified: Secondary | ICD-10-CM | POA: Diagnosis present

## 2021-07-11 DIAGNOSIS — F32A Depression, unspecified: Secondary | ICD-10-CM | POA: Diagnosis present

## 2021-07-11 DIAGNOSIS — K760 Fatty (change of) liver, not elsewhere classified: Secondary | ICD-10-CM | POA: Diagnosis present

## 2021-07-11 DIAGNOSIS — Z8249 Family history of ischemic heart disease and other diseases of the circulatory system: Secondary | ICD-10-CM

## 2021-07-11 DIAGNOSIS — G894 Chronic pain syndrome: Secondary | ICD-10-CM | POA: Diagnosis present

## 2021-07-11 DIAGNOSIS — K219 Gastro-esophageal reflux disease without esophagitis: Secondary | ICD-10-CM | POA: Diagnosis present

## 2021-07-11 DIAGNOSIS — Z79899 Other long term (current) drug therapy: Secondary | ICD-10-CM | POA: Diagnosis not present

## 2021-07-11 DIAGNOSIS — Z882 Allergy status to sulfonamides status: Secondary | ICD-10-CM | POA: Diagnosis not present

## 2021-07-11 DIAGNOSIS — E1165 Type 2 diabetes mellitus with hyperglycemia: Secondary | ICD-10-CM | POA: Diagnosis present

## 2021-07-11 DIAGNOSIS — J9601 Acute respiratory failure with hypoxia: Secondary | ICD-10-CM | POA: Diagnosis present

## 2021-07-11 DIAGNOSIS — Z833 Family history of diabetes mellitus: Secondary | ICD-10-CM | POA: Diagnosis not present

## 2021-07-11 DIAGNOSIS — M5136 Other intervertebral disc degeneration, lumbar region: Secondary | ICD-10-CM | POA: Diagnosis present

## 2021-07-11 DIAGNOSIS — Z7951 Long term (current) use of inhaled steroids: Secondary | ICD-10-CM | POA: Diagnosis not present

## 2021-07-11 DIAGNOSIS — K625 Hemorrhage of anus and rectum: Secondary | ICD-10-CM | POA: Diagnosis present

## 2021-07-11 DIAGNOSIS — Z7982 Long term (current) use of aspirin: Secondary | ICD-10-CM

## 2021-07-11 LAB — CBC
HCT: 51.2 % — ABNORMAL HIGH (ref 36.0–46.0)
Hemoglobin: 15.8 g/dL — ABNORMAL HIGH (ref 12.0–15.0)
MCH: 27.8 pg (ref 26.0–34.0)
MCHC: 30.9 g/dL (ref 30.0–36.0)
MCV: 90.1 fL (ref 80.0–100.0)
Platelets: 258 10*3/uL (ref 150–400)
RBC: 5.68 MIL/uL — ABNORMAL HIGH (ref 3.87–5.11)
RDW: 14.2 % (ref 11.5–15.5)
WBC: 7.5 10*3/uL (ref 4.0–10.5)
nRBC: 0 % (ref 0.0–0.2)

## 2021-07-11 LAB — APTT: aPTT: 27 seconds (ref 24–36)

## 2021-07-11 LAB — COMPREHENSIVE METABOLIC PANEL
ALT: 30 U/L (ref 0–44)
AST: 14 U/L — ABNORMAL LOW (ref 15–41)
Albumin: 3.9 g/dL (ref 3.5–5.0)
Alkaline Phosphatase: 117 U/L (ref 38–126)
Anion gap: 11 (ref 5–15)
BUN: 9 mg/dL (ref 6–20)
CO2: 23 mmol/L (ref 22–32)
Calcium: 9.6 mg/dL (ref 8.9–10.3)
Chloride: 103 mmol/L (ref 98–111)
Creatinine, Ser: 0.81 mg/dL (ref 0.44–1.00)
GFR, Estimated: >60 mL/min (ref >60)
Glucose, Bld: 323 mg/dL — ABNORMAL HIGH (ref 70–99)
Potassium: 4 mmol/L (ref 3.5–5.1)
Sodium: 137 mmol/L (ref 135–145)
Total Bilirubin: 1.1 mg/dL (ref 0.3–1.2)
Total Protein: 7.6 g/dL (ref 6.5–8.1)

## 2021-07-11 LAB — LIPASE, BLOOD: Lipase: 36 U/L (ref 11–51)

## 2021-07-11 LAB — PROTIME-INR
INR: 1 (ref 0.8–1.2)
Prothrombin Time: 12.6 seconds (ref 11.4–15.2)

## 2021-07-11 LAB — RESP PANEL BY RT-PCR (FLU A&B, COVID) ARPGX2
Influenza A by PCR: NEGATIVE
Influenza B by PCR: NEGATIVE
SARS Coronavirus 2 by RT PCR: NEGATIVE

## 2021-07-11 LAB — GLUCOSE, CAPILLARY
Glucose-Capillary: 299 mg/dL — ABNORMAL HIGH (ref 70–99)
Glucose-Capillary: 429 mg/dL — ABNORMAL HIGH (ref 70–99)
Glucose-Capillary: 444 mg/dL — ABNORMAL HIGH (ref 70–99)

## 2021-07-11 LAB — TYPE AND SCREEN
ABO/RH(D): O POS
Antibody Screen: NEGATIVE

## 2021-07-11 LAB — BRAIN NATRIURETIC PEPTIDE: B Natriuretic Peptide: 11.6 pg/mL (ref 0.0–100.0)

## 2021-07-11 LAB — GLUCOSE, RANDOM: Glucose, Bld: 454 mg/dL — ABNORMAL HIGH (ref 70–99)

## 2021-07-11 LAB — TROPONIN I (HIGH SENSITIVITY)
Troponin I (High Sensitivity): 7 ng/L (ref <18)
Troponin I (High Sensitivity): 6 ng/L (ref <18)

## 2021-07-11 MED ORDER — ORAL CARE MOUTH RINSE
15.0000 mL | Freq: Two times a day (BID) | OROMUCOSAL | Status: DC
  Administered 2021-07-12: 15 mL via OROMUCOSAL

## 2021-07-11 MED ORDER — ROSUVASTATIN CALCIUM 10 MG PO TABS
20.0000 mg | ORAL_TABLET | Freq: Every day | ORAL | Status: DC
  Administered 2021-07-12: 20 mg via ORAL
  Filled 2021-07-11: qty 2

## 2021-07-11 MED ORDER — IPRATROPIUM-ALBUTEROL 0.5-2.5 (3) MG/3ML IN SOLN
3.0000 mL | Freq: Once | RESPIRATORY_TRACT | Status: AC
  Administered 2021-07-11: 3 mL via RESPIRATORY_TRACT
  Filled 2021-07-11: qty 3

## 2021-07-11 MED ORDER — ACETAMINOPHEN 325 MG PO TABS
650.0000 mg | ORAL_TABLET | Freq: Four times a day (QID) | ORAL | Status: DC | PRN
  Administered 2021-07-12: 650 mg via ORAL
  Filled 2021-07-11: qty 2

## 2021-07-11 MED ORDER — PROPRANOLOL HCL 10 MG PO TABS
40.0000 mg | ORAL_TABLET | Freq: Two times a day (BID) | ORAL | Status: DC
  Administered 2021-07-11 – 2021-07-12 (×2): 40 mg via ORAL
  Filled 2021-07-11 (×2): qty 4

## 2021-07-11 MED ORDER — INSULIN ASPART PROT & ASPART (70-30 MIX) 100 UNIT/ML ~~LOC~~ SUSP
20.0000 [IU] | Freq: Two times a day (BID) | SUBCUTANEOUS | Status: DC
  Administered 2021-07-11 – 2021-07-12 (×2): 20 [IU] via SUBCUTANEOUS
  Filled 2021-07-11: qty 10

## 2021-07-11 MED ORDER — MONTELUKAST SODIUM 10 MG PO TABS
10.0000 mg | ORAL_TABLET | Freq: Every day | ORAL | Status: DC
  Administered 2021-07-11: 10 mg via ORAL
  Filled 2021-07-11: qty 1

## 2021-07-11 MED ORDER — LOSARTAN POTASSIUM 50 MG PO TABS
50.0000 mg | ORAL_TABLET | Freq: Every day | ORAL | Status: DC
  Administered 2021-07-11 – 2021-07-12 (×2): 50 mg via ORAL
  Filled 2021-07-11 (×2): qty 1

## 2021-07-11 MED ORDER — NICOTINE 21 MG/24HR TD PT24
21.0000 mg | MEDICATED_PATCH | Freq: Every day | TRANSDERMAL | Status: DC
  Filled 2021-07-11: qty 1

## 2021-07-11 MED ORDER — SODIUM CHLORIDE 0.9 % IV BOLUS
1000.0000 mL | Freq: Once | INTRAVENOUS | Status: AC
Start: 2021-07-11 — End: 2021-07-11
  Administered 2021-07-11: 1000 mL via INTRAVENOUS

## 2021-07-11 MED ORDER — INSULIN ASPART 100 UNIT/ML IJ SOLN
0.0000 [IU] | Freq: Every day | INTRAMUSCULAR | Status: DC
  Administered 2021-07-11: 5 [IU] via SUBCUTANEOUS
  Filled 2021-07-11: qty 1

## 2021-07-11 MED ORDER — ALBUTEROL SULFATE (2.5 MG/3ML) 0.083% IN NEBU: 2.5000 mg | INHALATION_SOLUTION | RESPIRATORY_TRACT | Status: DC | PRN

## 2021-07-11 MED ORDER — ONDANSETRON HCL 4 MG/2ML IJ SOLN
4.0000 mg | Freq: Three times a day (TID) | INTRAMUSCULAR | Status: DC | PRN
  Administered 2021-07-12: 4 mg via INTRAVENOUS
  Filled 2021-07-11: qty 2

## 2021-07-11 MED ORDER — INSULIN ASPART PROT & ASPART (70-30 MIX) 100 UNIT/ML PEN
20.0000 [IU] | PEN_INJECTOR | Freq: Two times a day (BID) | SUBCUTANEOUS | Status: DC
  Filled 2021-07-11: qty 3

## 2021-07-11 MED ORDER — INSULIN ASPART 100 UNIT/ML IJ SOLN
2.0000 [IU] | Freq: Once | INTRAMUSCULAR | Status: AC
  Administered 2021-07-11: 2 [IU] via SUBCUTANEOUS
  Filled 2021-07-11: qty 1

## 2021-07-11 MED ORDER — IPRATROPIUM-ALBUTEROL 0.5-2.5 (3) MG/3ML IN SOLN
3.0000 mL | Freq: Three times a day (TID) | RESPIRATORY_TRACT | Status: DC
  Administered 2021-07-12 (×2): 3 mL via RESPIRATORY_TRACT
  Filled 2021-07-11 (×2): qty 3

## 2021-07-11 MED ORDER — DM-GUAIFENESIN ER 30-600 MG PO TB12: 1.0000 | ORAL_TABLET | Freq: Two times a day (BID) | ORAL | Status: DC | PRN

## 2021-07-11 MED ORDER — FENTANYL CITRATE PF 50 MCG/ML IJ SOSY
50.0000 ug | PREFILLED_SYRINGE | Freq: Once | INTRAMUSCULAR | Status: AC
  Administered 2021-07-11: 50 ug via INTRAVENOUS
  Filled 2021-07-11: qty 1

## 2021-07-11 MED ORDER — FLUTICASONE PROPIONATE 50 MCG/ACT NA SUSP
2.0000 | Freq: Every day | NASAL | Status: DC
  Administered 2021-07-12: 2 via NASAL
  Filled 2021-07-11: qty 16

## 2021-07-11 MED ORDER — FENTANYL CITRATE PF 50 MCG/ML IJ SOSY: 25.0000 ug | PREFILLED_SYRINGE | INTRAMUSCULAR | Status: DC | PRN

## 2021-07-11 MED ORDER — PREGABALIN 50 MG PO CAPS
100.0000 mg | ORAL_CAPSULE | Freq: Two times a day (BID) | ORAL | Status: DC
  Administered 2021-07-11 – 2021-07-12 (×2): 100 mg via ORAL
  Filled 2021-07-11 (×2): qty 2

## 2021-07-11 MED ORDER — INSULIN ASPART 100 UNIT/ML IJ SOLN
0.0000 [IU] | Freq: Three times a day (TID) | INTRAMUSCULAR | Status: DC
  Administered 2021-07-11 – 2021-07-12 (×2): 8 [IU] via SUBCUTANEOUS
  Administered 2021-07-12: 2 [IU] via SUBCUTANEOUS
  Administered 2021-07-12: 8 [IU] via SUBCUTANEOUS
  Filled 2021-07-11 (×4): qty 1

## 2021-07-11 MED ORDER — ACETAMINOPHEN 500 MG PO TABS
1000.0000 mg | ORAL_TABLET | Freq: Once | ORAL | Status: AC
  Administered 2021-07-11: 1000 mg via ORAL
  Filled 2021-07-11: qty 2

## 2021-07-11 MED ORDER — METHYLPREDNISOLONE SODIUM SUCC 125 MG IJ SOLR
125.0000 mg | Freq: Once | INTRAMUSCULAR | Status: AC
  Administered 2021-07-11: 125 mg via INTRAVENOUS
  Filled 2021-07-11: qty 2

## 2021-07-11 MED ORDER — ADULT MULTIVITAMIN W/MINERALS CH
1.0000 | ORAL_TABLET | Freq: Every day | ORAL | Status: DC
  Administered 2021-07-12: 1 via ORAL
  Filled 2021-07-11: qty 1

## 2021-07-11 MED ORDER — SUMATRIPTAN SUCCINATE 50 MG PO TABS: 50.0000 mg | ORAL_TABLET | ORAL | Status: DC | PRN

## 2021-07-11 MED ORDER — PANTOPRAZOLE SODIUM 40 MG PO TBEC
40.0000 mg | DELAYED_RELEASE_TABLET | Freq: Two times a day (BID) | ORAL | Status: DC
  Administered 2021-07-11 – 2021-07-12 (×2): 40 mg via ORAL
  Filled 2021-07-11 (×2): qty 1

## 2021-07-11 MED ORDER — TOPIRAMATE 25 MG PO TABS
25.0000 mg | ORAL_TABLET | Freq: Two times a day (BID) | ORAL | Status: DC
  Administered 2021-07-11 – 2021-07-12 (×2): 25 mg via ORAL
  Filled 2021-07-11 (×3): qty 1

## 2021-07-11 MED ORDER — ONDANSETRON HCL 4 MG/2ML IJ SOLN
4.0000 mg | Freq: Once | INTRAMUSCULAR | Status: AC
  Administered 2021-07-11: 4 mg via INTRAVENOUS
  Filled 2021-07-11: qty 2

## 2021-07-11 MED ORDER — LINACLOTIDE 145 MCG PO CAPS
290.0000 ug | ORAL_CAPSULE | Freq: Every day | ORAL | Status: DC
  Administered 2021-07-12: 290 ug via ORAL
  Filled 2021-07-11: qty 1
  Filled 2021-07-11: qty 2

## 2021-07-11 MED ORDER — TRAZODONE HCL 50 MG PO TABS: 150.0000 mg | ORAL_TABLET | Freq: Every day | ORAL | Status: DC

## 2021-07-11 MED ORDER — HYDROCODONE-ACETAMINOPHEN 5-325 MG PO TABS: 1.5000 | ORAL_TABLET | Freq: Three times a day (TID) | ORAL | Status: DC | PRN

## 2021-07-11 MED ORDER — VENLAFAXINE HCL ER 75 MG PO CP24
75.0000 mg | ORAL_CAPSULE | Freq: Every day | ORAL | Status: DC
Start: 2021-07-12 — End: 2021-07-11

## 2021-07-11 MED ORDER — IPRATROPIUM-ALBUTEROL 0.5-2.5 (3) MG/3ML IN SOLN
3.0000 mL | RESPIRATORY_TRACT | Status: DC
  Administered 2021-07-11: 3 mL via RESPIRATORY_TRACT
  Filled 2021-07-11 (×2): qty 3

## 2021-07-11 MED ORDER — IPRATROPIUM-ALBUTEROL 0.5-2.5 (3) MG/3ML IN SOLN
3.0000 mL | Freq: Once | RESPIRATORY_TRACT | Status: AC
Start: 2021-07-11 — End: 2021-07-11
  Administered 2021-07-11: 3 mL via RESPIRATORY_TRACT
  Filled 2021-07-11: qty 3

## 2021-07-11 MED ORDER — DARIFENACIN HYDROBROMIDE ER 7.5 MG PO TB24
7.5000 mg | ORAL_TABLET | Freq: Every day | ORAL | Status: DC
  Administered 2021-07-12: 7.5 mg via ORAL
  Filled 2021-07-11: qty 1

## 2021-07-11 MED ORDER — LORATADINE 10 MG PO TABS
10.0000 mg | ORAL_TABLET | Freq: Every day | ORAL | Status: DC
  Administered 2021-07-11: 10 mg via ORAL
  Filled 2021-07-11: qty 1

## 2021-07-11 MED ORDER — VENLAFAXINE HCL ER 75 MG PO CP24
225.0000 mg | ORAL_CAPSULE | Freq: Every day | ORAL | Status: DC
  Administered 2021-07-12: 225 mg via ORAL
  Filled 2021-07-11: qty 3

## 2021-07-11 MED ORDER — IOHEXOL 350 MG/ML SOLN
80.0000 mL | Freq: Once | INTRAVENOUS | Status: AC | PRN
  Administered 2021-07-11: 75 mL via INTRAVENOUS
  Filled 2021-07-11: qty 80

## 2021-07-11 MED ORDER — LIDOCAINE 5 % EX PTCH
1.0000 | MEDICATED_PATCH | CUTANEOUS | Status: DC
  Administered 2021-07-11 – 2021-07-12 (×2): 1 via TRANSDERMAL
  Filled 2021-07-11 (×2): qty 1

## 2021-07-11 MED ORDER — HYDRALAZINE HCL 20 MG/ML IJ SOLN: 5.0000 mg | INTRAMUSCULAR | Status: DC | PRN

## 2021-07-11 MED ORDER — MOMETASONE FURO-FORMOTEROL FUM 200-5 MCG/ACT IN AERO
2.0000 | INHALATION_SPRAY | Freq: Two times a day (BID) | RESPIRATORY_TRACT | Status: DC
  Administered 2021-07-11 – 2021-07-12 (×2): 2 via RESPIRATORY_TRACT
  Filled 2021-07-11: qty 8.8

## 2021-07-11 MED ORDER — ARIPIPRAZOLE 10 MG PO TABS
5.0000 mg | ORAL_TABLET | Freq: Every day | ORAL | Status: DC
  Administered 2021-07-12: 5 mg via ORAL
  Filled 2021-07-11 (×2): qty 1

## 2021-07-11 MED ORDER — MIRTAZAPINE 15 MG PO TABS: 45.0000 mg | ORAL_TABLET | Freq: Every day | ORAL | Status: DC

## 2021-07-11 NOTE — ED Notes (Signed)
Pt. Ambulated by this tech, o2 sat. Dropped to 86% on room air after walking up and down the hallway. Patients complained of sob after ambulating. RN notified.  ?

## 2021-07-11 NOTE — ED Notes (Signed)
Patient's room air sats dropping to 84-88%. Patient reports some shortness of breath and a dry cough that started on Friday.  ?Placed on 3L via [place]. Reports using a cpap at night but it on room air at baseline. Improvement of sats to 93%.  ?

## 2021-07-11 NOTE — Assessment & Plan Note (Signed)
-   Crestor 

## 2021-07-11 NOTE — Assessment & Plan Note (Signed)
Etiology is not clear.  Patient does not use oxygen normally.  Now patient needs 3 L oxygen.  No history of CHF.  BNP 11.6, does not seem to have acute CHF.  Patient has history of asthma, but on auscultation patient has decreased air movement, no wheezing, does not seem to have asthma exacerbation.  Given long history of smoking, patient may have undiagnosed COPD, but no wheezing or rhonchi on auscultation.  Does not seem to have COPD exacerbation.  CT angiogram negative for PE, but showed possible pulmonary hypertension. ? ?- will admit to tele med bed as inpatient ?-Bronchodilators ?-Solu-Medrol 125 mg was given in ED ?-Mucinex for cough  ?-Incentive spirometry ?-sputum culture ?-Nasal cannula oxygen as needed to maintain O2 saturation 93% or greater ?-will get 2d echo ? ?

## 2021-07-11 NOTE — Assessment & Plan Note (Signed)
Recent A1c >15.5, poorly controlled.  Patient is a taking 70/30 mixed insulin 30 units twice daily and metformin at home ?-70/30 insulin 20 unit twice daily ?-Sliding scale insulin ?

## 2021-07-11 NOTE — ED Notes (Signed)
First Nurse Note:  Pt to ED via POV for right side pain and nausea. Pt is in NAD.  ?

## 2021-07-11 NOTE — ED Provider Notes (Signed)
? ?[facility] ?Provider Note ? ? ? Event Date/Time  ? First MD Initiated Contact with Patient 07/11/21 [id number]   ?  (approximate) ? ? ?History  ? ?Rectal Bleeding and Abdominal Pain ? ? ?HPI ? ?[name] is a 61 y.o. female who comes in with concerns for dark stool and right-sided abdominal pain.  Patient oxygen levels were dropping to 84-88% so patient was placed on 3 L.  Patient reports that she started developing some abdominal pain on Wednesday, 6 days ago.  She had 1 episode of dark black stool at that time.  Since then she has not had any bowel movements or rectal bleeding.  She states that sometimes she can go this long without a bowel movement.  She reports that the pain was getting worse and she started feeling nauseous which is why she came to the emergency room.  She does report some baseline shortness of breath and stated that it was maybe a little bit worse than normal. ? ? ?I reviewed patient's hospital summary from January 2023 where she came in for altered mental status patient was on a Narcan drip. ? ?Physical Exam  ? ?Triage Vital Signs: ?ED Triage Vitals  ?Enc Vitals Group  ?   BP 07/11/21 0929 116/80  ?   Pulse Rate 07/11/21 0929 (!) 102  ?   Resp 07/11/21 0929 17  ?   Temp 07/11/21 0929 98.1 ?F (36.7 ?C)  ?   Temp Source 07/11/21 0929 Oral  ?   SpO2 07/11/21 0929 92 %  ?   Weight 07/11/21 0930 120 lb (54.4 kg)  ?   Height 07/11/21 0930 4\' 11"  (1.499 m)  ?   Head Circumference --   ?   Peak Flow --   ?   Pain Score 07/11/21 0930 10  ?   Pain Loc --   ?   Pain Edu? --   ?   Excl. in [place]? --   ? ? ?Most recent vital signs: ?Vitals:  ? 07/11/21 0947 07/11/21 0949  ?BP:    ?Pulse: 92 91  ?Resp:    ?Temp:    ?SpO2: 91% 92%  ? ? ? ?General: Awake, no distress.  ?CV:  Good peripheral perfusion.  ?Resp:  Normal effort.  Patient placed on 3 L ?Abd:  No distention.  ?Other:  Patient tender on the right side of her abdomen.  Rectal exam had very little stool in the vault but  what I did get out was Hemoccult negative ?No swelling of the legs.  No calf tenderness ? ? ?ED Results / Procedures / Treatments  ? ?Labs ?(all labs ordered are listed, but only abnormal results are displayed) ?Labs Reviewed  ?CBC - Abnormal; Notable for the following components:  ?    Result Value  ? RBC 5.68 (*)   ? Hemoglobin 15.8 (*)   ? HCT 51.2 (*)   ? All other components within normal limits  ?RESP PANEL BY RT-PCR (FLU A&B, COVID) [id number]  ?COMPREHENSIVE METABOLIC PANEL  ?LIPASE, BLOOD  ?POC OCCULT BLOOD, ED  ?TYPE AND SCREEN  ?TROPONIN I (HIGH SENSITIVITY)  ? ? ? ?EKG ? ?My interpretation of EKG: ? ?Normal sinus rate of 89 without any ST elevation, T wave version in lead III, normal intervals ? ?RADIOLOGY ?I have reviewed the xray personally and the patient is got a little bit of atelectasis in the bibasilar bases ? ?PROCEDURES: ? ?Critical Care performed: Yes, see critical care  procedure note(s) ? ?.Critical Care ?Performed by: Vanessa Eden, MD ?Authorized by: Vanessa Maynard, MD  ? ?Critical care provider statement:  ?  Critical care time (minutes):  30 ?  Critical care was necessary to treat or prevent imminent or life-threatening deterioration of the following conditions:  Respiratory failure ?  Critical care was time spent personally by me on the following activities:  Development of treatment plan with patient or surrogate, discussions with consultants, evaluation of patient's response to treatment, examination of patient, ordering and review of laboratory studies, ordering and review of radiographic studies, ordering and performing treatments and interventions, pulse oximetry, re-evaluation of patient's condition and review of old charts ? ? ?MEDICATIONS ORDERED IN ED: ?Medications  ?fentaNYL (SUBLIMAZE) injection 50 mcg (50 mcg Intravenous Given 07/11/21 1012)  ?ondansetron (ZOFRAN) injection 4 mg (4 mg Intravenous Given 07/11/21 1012)  ?sodium chloride 0.9 % bolus 1,000 mL (1,000 mLs Intravenous New  Bag/Given 07/11/21 1110)  ?iohexol (OMNIPAQUE) 350 MG/ML injection 80 mL (75 mLs Intravenous Contrast Given 07/11/21 1134)  ? ? ? ?IMPRESSION / MDM / ASSESSMENT AND PLAN / ED COURSE  ?I reviewed the triage vital signs and the nursing notes. ? ?Patient comes in with right upper quadrant pain with concern for 1 episode of dark stool as well as notable new hypoxia.  Will get chest x-ray to further evaluate, EKG, cardiac markers, BNP to evaluate for fluid overload.  Patient given some IV fentanyl, IV Zofran, IV fluids help with symptoms ? ?Chest x-ray negative.  BNP normal.  CMP shows elevated glucose but no evidence of DKA.  Hemoglobin stable and slightly elevated on CBC ? ?CT imaging of the chest is negative but concern for possible pulmonary hypertension.  CT abdomen also shows prior cholecystectomy but no other issues. ? ?Attempted to take patient off of the oxygen and ambulate her but she desatted down to 86%.  She states consistently around 90 to 93% off the oxygen.  She does report smoking history we will give some Solu-Medrol, DuoNebs in case this could be like a bronchitis picture.  Will discuss with hospital team for admission due to new hypoxia ? ? ? ?  ? ? ?FINAL CLINICAL IMPRESSION(S) / ED DIAGNOSES  ? ?Final diagnoses:  ?Acute respiratory failure with hypoxia ([place])  ? ? ? ?Rx / DC Orders  ? ?ED Discharge Orders   ? ? None  ? ?  ? ? ? ?Note:  This document was prepared using Dragon voice recognition software and may include unintentional dictation errors. ?  ?Vanessa , MD ?07/11/21 1413 ? ?

## 2021-07-11 NOTE — ED Triage Notes (Signed)
Pt comes into the ED via POV c/o rectal bleeding and right side abd pain.  Pt denies any blood thinner use.  PT states that as of yesterday, she started having severe right side abd pain.  Pt explains that her stool is black in color.  Pt in NAD at this time with even and unlabored respirations.  ?

## 2021-07-11 NOTE — H&P (Signed)
?History and Physical  ? ? ?[name] OFH:[id number] DOB: [birth year] DOA: 07/11/2021 ? ?Referring MD/NP/PA:  ? ?PCP: Dan Humphreys, MD  ? ?Patient coming from:  The patient is coming from home.  At baseline, pt is independent for most of ADL.       ? ?Chief Complaint: Shortness of breath, rectal bleeding, abdominal pain. ? ?HPI: [name] is a 61 y.o. female with medical history significant of hypertension, hyperlipidemia, diabetes mellitus, asthma, GERD, depression, anxiety, OSA on CPAP, fibromyalgia, tobacco abuse, migraine headache, who presented with shortness of breath, rectal bleeding, abdominal pain. ? ?Patient states that her shortness of breath that started on Friday, which has been persistent.  Patient has dry cough, no chest pain, fever or chills.  Patient states that she has 1 episode of rectal bleeding with dark stool on Friday.  She states she has abdominal pain, which is located in the right side of abdomen, constant, aching, 8 out of 10 in severity, aggravated by deep breath.  Patient has nausea, no vomiting.  Patient states that she had mild diarrhea on Friday, which has resolved.  Currently patient does not have diarrhea.  Denies symptoms of UTI. ? ?Patient was found to have oxygen desaturation to 88 % on room air, which improved to 95% on 3 L oxygen.  Patient is not using oxygen normally. ? ?Data Reviewed and ED Course: pt was found to have hemoglobin 15.8, WBC 7.5, BNP 11.6, troponin level 7 --> 6, negative COVID PCR, GFR > 60, temperature normal, blood pressure 121/89, heart rate 102, creatinine.  Chest x-ray showed bilateral segmental atelectasis.  CT scan of abdomen/pelvis is negative for acute intra-abdominal issues.  CT angiogram is negative for PE, showed possible pulmonary hypertension.  Patient is admitted to telemetry bed as inpatient ? ?EKG: I have personally reviewed.  Sinus rhythm, QTc 455, poor R wave progression, T wave inversion only in lead III. ? ?Review of  Systems:  ? ?General: no fevers, chills, no body weight gain, has poor appetite, has fatigue ?HEENT: no blurry vision, hearing changes or sore throat ?Respiratory: has dyspnea, coughing, no wheezing ?CV: no chest pain, no palpitations ?GI: has nausea, abdominal pain, no diarrhea, constipation, vomiting,  ?GU: no dysuria, burning on urination, increased urinary frequency, hematuria  ?Ext: no leg edema ?Neuro: no unilateral weakness, numbness, or tingling, no vision change or hearing loss ?Skin: no rash, no skin tear. ?MSK: No muscle spasm, no deformity, no limitation of range of movement in spin ?Heme: No easy bruising.  ?Travel history: No recent long distant travel. ? ? ?Allergy:  ?Allergies  ?Allergen Reactions  ? Tramadol Itching  ?  Headaches and confusion.  ? Benadryl [Diphenhydramine Hcl] Itching  ? Bextra [Valdecoxib] Itching  ? Invokana [Canagliflozin] Other (See Comments)  ?  Causes yeast infection  ? Sulfamethoxazole-Trimethoprim Other (See Comments)  ?  "Makes me feel weird"  ? ? ?Past Medical History:  ?Diagnosis Date  ? Abnormal CBC   ? Asthma   ? Asthma   ? BPPV (benign paroxysmal positional vertigo)   ? none recently  ? Cardiac murmur, unspecified 08/28/2016  ? CPAP (continuous positive airway pressure) dependence   ? Degenerative disc disease, lumbar   ? Depression   ? Diabetes mellitus   ? Diverticulitis 10/2016  ? Elevated LFTs   ? Fatty liver   ? Fibromyalgia   ? Fibromyalgia   ? GERD (gastroesophageal reflux disease)   ? Headache   ? migraines.  None  since starting topamax  ? History of degenerative disc disease   ? Hyperlipidemia   ? Hypertension   ? Sleep apnea   ? CPAP  ? ? ?Past Surgical History:  ?Procedure Laterality Date  ? ABDOMINAL HYSTERECTOMY    ? ABDOMINAL SURGERY    ? CATARACT EXTRACTION W/PHACO Left 09/07/2019  ? Procedure: CATARACT EXTRACTION PHACO AND INTRAOCULAR LENS PLACEMENT (IOC) LEFT DIABETIC;  Surgeon: Galen Manila, MD;  Location: [facility];  Service:  Ophthalmology;  Laterality: Left;  4.01 ?0:43.8  ? CATARACT EXTRACTION W/PHACO Right 09/28/2019  ? Procedure: CATARACT EXTRACTION PHACO AND INTRAOCULAR LENS PLACEMENT (IOC) RIGHT DIABETIC 4.16  00:43.3;  Surgeon: Galen Manila, MD;  Location: [facility];  Service: Ophthalmology;  Laterality: Right;  Diabetic - insulin and oral meds  ? CHOLECYSTECTOMY N/A 04/27/2018  ? Procedure: LAPAROSCOPIC CHOLECYSTECTOMY;  Surgeon: Carolan Shiver, MD;  Location: [facility] OR[facility];  Service: General;  Laterality: N/A;  ? COLONOSCOPY WITH PROPOFOL N/A 02/20/2017  ? Procedure: COLONOSCOPY WITH PROPOFOL;  Surgeon: Christena Deem, MD;  Location: [facility];  Service: Endoscopy;  Laterality: N/A;  ? Diverticulitis    ? ESOPHAGOGASTRODUODENOSCOPY (EGD) WITH PROPOFOL N/A 02/20/2017  ? Procedure: ESOPHAGOGASTRODUODENOSCOPY (EGD) WITH PROPOFOL;  Surgeon: Christena Deem, MD;  Location: [facility] ENDOSCOPY;  Service: Endoscopy;  Laterality: N/A;  ? EUS N/A 01/08/2018  ? Procedure: UPPER ENDOSCOPIC ULTRASOUND (EUS) RADIAL;  Surgeon: Bearl Mulberry, MD;  Location: [facility] ENDOSCOPY;  Service: Gastroenterology;  Laterality: N/A;  ? UPPER ESOPHAGEAL ENDOSCOPIC ULTRASOUND (EUS) N/A 04/03/2017  ? Procedure: UPPER ESOPHAGEAL ENDOSCOPIC ULTRASOUND (EUS);  Surgeon: Bearl Mulberry, MD;  Location: [facility] ENDOSCOPY;  Service: Gastroenterology;  Laterality: N/A;  ? ? ?Social History:  reports that she has been smoking cigarettes. She has a 7.00 pack-year smoking history. She has been exposed to tobacco smoke. She has quit using smokeless tobacco. She reports current alcohol use of about 2.0 standard drinks per week. She reports that she does not currently use drugs. ? ?Family History:  ?Family History  ?Problem Relation Age of Onset  ? Diabetes Mother   ? Hypertension Mother   ?  ? ?Prior to Admission medications   ?Medication Sig Start Date End Date Taking? Authorizing Provider  ?ACCU-CHEK GUIDE test strip USE TID 12/30/17   [provider]  ?albuterol (PROVENTIL HFA;VENTOLIN HFA) 108 (90 Base) MCG/ACT inhaler Inhale 1 puff into the lungs 2 (two) times daily as needed.    [provider]  ?ARIPiprazole (ABILIFY) 5 MG tablet Take 5 mg by mouth daily.    [provider]  ?aspirin 81 MG tablet Take 81 mg by mouth daily.     [provider]  ?budesonide-formoterol (SYMBICORT) 160-4.5 MCG/ACT inhaler Inhale 2 puffs into the lungs 2 (two) times daily.    [provider]  ?cetirizine (ZYRTEC) 10 MG tablet Take by mouth. 04/28/20   [provider]  ?Clydia Llano MCG/ACT AERO Inhale 2 puffs into the lungs 2 (two) times daily. 05/22/21   [provider]  ?ergocalciferol (VITAMIN D2) 1.25 MG (50000 UT) capsule TAKE 1 CAPSULE BY MOUTH 1 TIME A WEEK 01/23/21   [provider]  ?famotidine (PEPCID) 40 MG tablet Take 20 mg by mouth daily.    [provider]  ?fluticasone (FLONASE) 50 MCG/ACT nasal spray Place 2 sprays into both nostrils daily. 05/22/21   [provider]  ?HYDROcodone-acetaminophen (NORCO) 7.5-325 MG tablet Take 1 tablet by mouth 3 (three) times daily as needed for severe pain.  Must last 30 days 06/26/21 07/26/21  Edward JollyLateef, Bilal, MD  ?HYDROcodone-acetaminophen (NORCO) 7.5-325 MG tablet Take 1 tablet by mouth 3 (three) times daily as needed for severe pain. Must last 30 days 07/26/21 08/25/21  Edward JollyLateef, Bilal, MD  ?HYDROcodone-acetaminophen (NORCO) 7.5-325 MG tablet Take 1 tablet by mouth 3 (three) times daily as needed for severe pain. Must last 30 days 08/25/21 09/24/21  Edward JollyLateef, Bilal, MD  ?ibuprofen (ADVIL) 800 MG tablet Take 800 mg by mouth every 6 (six) hours as needed. 12/08/20   [provider]  ?insulin aspart protamine - aspart (NOVOLOG 70/30 MIX) (70-30) 100 UNIT/ML FlexPen Inject 30 Units into the skin 2 (two) times daily with a meal. 04/07/21   Noralee Stainhoi, Jennifer, DO  ?Insulin Pen Needle 31G X 5 MM MISC 1 Units by Does not apply route in the morning and at  bedtime. 04/07/21   Noralee Stainhoi, Jennifer, DO  ?LINZESS 290 MCG CAPS capsule Take 290 mcg by mouth daily. 04/12/21   [provider]  ?losartan (COZAAR) 100 MG tablet TAKE 1 TABLET(100 MG) BY MOUTH EVERY DAY 3/31/2

## 2021-07-11 NOTE — Assessment & Plan Note (Addendum)
Etiology is not clear.  CT abdomen/pelvis negative for acute intra-abdominal issues.  Lipase normal 36.  May be due to her fibromyalgia ?-As needed fentanyl ?-Continue home Norco as needed ?

## 2021-07-11 NOTE — Assessment & Plan Note (Addendum)
-   Continue home as needed Norco ?-Pregabalin ?

## 2021-07-11 NOTE — Assessment & Plan Note (Signed)
-   Continue home medications 

## 2021-07-11 NOTE — Assessment & Plan Note (Signed)
Protonix.  ?

## 2021-07-11 NOTE — Assessment & Plan Note (Signed)
Patient reported 1 episode of rectal bleeding.  Hemoglobin stable 15.8 ?-Oral Protonix 40 mg twice daily ?-Follow-up CBC morning ?

## 2021-07-11 NOTE — Assessment & Plan Note (Addendum)
-   IV hydralazine as needed ?-Continue home Cozaar, propranolol ?

## 2021-07-11 NOTE — Assessment & Plan Note (Addendum)
-   Bronchodilators, Singulair ?

## 2021-07-11 NOTE — Assessment & Plan Note (Signed)
CPAP.  

## 2021-07-11 NOTE — Assessment & Plan Note (Signed)
-  Nicotine patch 

## 2021-07-11 NOTE — Assessment & Plan Note (Signed)
-   Bronchodilators, Singulair ?

## 2021-07-12 ENCOUNTER — Inpatient Hospital Stay (HOSPITAL_COMMUNITY)
Admit: 2021-07-12 | Discharge: 2021-07-12 | Disposition: A | Discharge status: Home / Self Care | Payer: Medicaid Other | Attending: Internal Medicine | Admitting: Internal Medicine

## 2021-07-12 ENCOUNTER — Inpatient Hospital Stay: Payer: Medicaid Other | Admitting: Oncology

## 2021-07-12 DIAGNOSIS — J9601 Acute respiratory failure with hypoxia: Secondary | ICD-10-CM | POA: Diagnosis not present

## 2021-07-12 DIAGNOSIS — D751 Secondary polycythemia: Secondary | ICD-10-CM

## 2021-07-12 DIAGNOSIS — I272 Pulmonary hypertension, unspecified: Secondary | ICD-10-CM | POA: Diagnosis not present

## 2021-07-12 DIAGNOSIS — D72829 Elevated white blood cell count, unspecified: Secondary | ICD-10-CM

## 2021-07-12 LAB — GLUCOSE, CAPILLARY
Glucose-Capillary: 296 mg/dL — ABNORMAL HIGH (ref 70–99)
Glucose-Capillary: 290 mg/dL — ABNORMAL HIGH (ref 70–99)
Glucose-Capillary: 138 mg/dL — ABNORMAL HIGH (ref 70–99)

## 2021-07-12 LAB — CBC
HCT: 42.6 % (ref 36.0–46.0)
Hemoglobin: 13.4 g/dL (ref 12.0–15.0)
MCH: 27.7 pg (ref 26.0–34.0)
MCHC: 31.5 g/dL (ref 30.0–36.0)
MCV: 88.2 fL (ref 80.0–100.0)
Platelets: 279 10*3/uL (ref 150–400)
RBC: 4.83 MIL/uL (ref 3.87–5.11)
RDW: 14.4 % (ref 11.5–15.5)
WBC: 9 10*3/uL (ref 4.0–10.5)
nRBC: 0 % (ref 0.0–0.2)

## 2021-07-12 LAB — ECHOCARDIOGRAM COMPLETE
AR max vel: 2.73 cm2
AV Area VTI: 2.29 cm2
AV Area mean vel: 2.41 cm2
AV Mean grad: 2 mmHg
AV Peak grad: 3.7 mmHg
Ao pk vel: 0.96 m/s
Area-P 1/2: 3.48 cm2
Height: 59 in
MV VTI: 1.9 cm2
S' Lateral: 2.01 cm
Weight: 1920.01 oz

## 2021-07-12 MED ORDER — LIDOCAINE 5 % EX PTCH: 1.0000 | MEDICATED_PATCH | CUTANEOUS | 0 refills | Status: DC

## 2021-07-12 MED ORDER — INSULIN ASPART PROT & ASPART (70-30 MIX) 100 UNIT/ML ~~LOC~~ SUSP
25.0000 [IU] | Freq: Two times a day (BID) | SUBCUTANEOUS | Status: DC
  Filled 2021-07-12: qty 10

## 2021-07-12 MED ORDER — NICOTINE 21 MG/24HR TD PT24: 21.0000 mg | MEDICATED_PATCH | Freq: Every day | TRANSDERMAL | 0 refills | Status: DC

## 2021-07-12 NOTE — Plan of Care (Signed)

## 2021-07-12 NOTE — Discharge Summary (Signed)
?Physician Discharge Summary ?  ?Patient: [name] MRN: [id number] DOB: 04-18-1960  ?Admit date:     07/11/2021  ?Discharge date: 07/12/21  ?Discharge Physician: Arnetha CourserSumayya Jancy Sprankle  ? ?PCP: Dan HumphreysWaleh, Meshia Q, MD  ? ?Recommendations at discharge:  ?Follow-up with primary care provider within a week ?Please ensure that patient regularly takes her insulin and make changes for better control of her diabetes. ?Follow-up with pulmonology for concern of pulmonary hypertension. ? ?Discharge Diagnoses: ?Principal Problem: ?  Acute respiratory failure with hypoxia (HCC) ?Active Problems: ?  Abdominal pain ?  Rectal bleeding ?  Essential hypertension ?  Chronic pain syndrome ?  Smoker ?  Type 2 diabetes mellitus without complications (HCC) ?  OSA on CPAP ?  Depression with anxiety ?  HLD (hyperlipidemia) ?  GERD (gastroesophageal reflux disease) ?  Asthma ? ? ?Hospital Course: ?Taken from H&P. ? ?[name] is a 61 y.o. female with medical history significant of hypertension, hyperlipidemia, diabetes mellitus, asthma, GERD, depression, anxiety, OSA on CPAP, fibromyalgia, tobacco abuse, migraine headache, who presented with shortness of breath, rectal bleeding, abdominal pain. ?  ?Patient states that her shortness of breath that started on Friday, which has been persistent.  Patient has dry cough, no chest pain, fever or chills.  Patient states that she has 1 episode of rectal bleeding with dark stool on Friday.  She states she has abdominal pain, which is located in the right side of abdomen, constant, aching, 8 out of 10 in severity, aggravated by deep breath.  Patient has nausea, no vomiting.  Patient states that she had mild diarrhea on Friday, which has resolved.  Currently patient does not have diarrhea.  Denies symptoms of UTI. ?  ?Patient was found to have oxygen desaturation to 88 % on room air, which improved to 95% on 3 L oxygen.  Patient is not using oxygen normally. ? ? pt was found to have hemoglobin  15.8, WBC 7.5, BNP 11.6, troponin level 7 --> 6, negative COVID PCR, GFR > 60, temperature normal, blood pressure 121/89, heart rate 102, creatinine.  Chest x-ray showed bilateral segmental atelectasis.  CT scan of abdomen/pelvis is negative for acute intra-abdominal issues.  CT angiogram is negative for PE, showed possible pulmonary hypertension. ? ?Patient was able to wean off from oxygen next morning.  We ambulated her and oxygen saturation remained above 94%.  Patient with subjective improvement of shortness of breath with oxygen, no desaturation.  Patient did not had any bowel movement during her stay.  Hemoglobin remained stable.  Patient need to see gastroenterologist if experience more bleeding. ?CT abdomen pelvis was negative for any acute intra-abdominal issues.  Lipase was normal.  May be due to her fibromyalgia.  She can continue her home pain medication regimen. ? ?Patient has no prior diagnosis of COPD, CT chest and echocardiogram with concern of pulmonary hypertension.  Echocardiogram was otherwise with normal EF. ?Does not appear to have any asthma or COPD exacerbation at this time.  She can continue her home bronchodilators and need to follow-up with pulmonology as an outpatient for further management of her pulmonary hypertension. ?She was counseled and provided with nicotine patch for smoking. ? ?Patient on multiple psych medications, she can continue but need to follow-up closely with her psychiatrist for med reconciliation and management. ? ?Patient was found to have uncontrolled type 2 diabetes mellitus with hyperglycemia and A1c above 15.5.  Patient admitted not using her 70/30 regularly. ?She was counseled for regular use of her  insulin and have a close follow-up with PCP for further recommendations and better control of diabetes. ? ?We will continue the rest of her home medications and will follow-up with her providers. ? ? ?Assessment and Plan: ?* Acute respiratory failure with hypoxia  (HCC) ?Etiology is not clear.  Patient does not use oxygen normally.  Now patient needs 3 L oxygen.  No history of CHF.  BNP 11.6, does not seem to have acute CHF.  Patient has history of asthma, but on auscultation patient has decreased air movement, no wheezing, does not seem to have asthma exacerbation.  Given long history of smoking, patient may have undiagnosed COPD, but no wheezing or rhonchi on auscultation.  Does not seem to have COPD exacerbation.  CT angiogram negative for PE, but showed possible pulmonary hypertension. ? ?- will admit to tele med bed as inpatient ?-Bronchodilators ?-Solu-Medrol 125 mg was given in ED ?-Mucinex for cough  ?-Incentive spirometry ?-sputum culture ?-Nasal cannula oxygen as needed to maintain O2 saturation 93% or greater ?-will get 2d echo ? ? ?Rectal bleeding ?Patient reported 1 episode of rectal bleeding.  Hemoglobin stable 15.8 ?-Oral Protonix 40 mg twice daily ?-Follow-up CBC morning ? ?Abdominal pain ?Etiology is not clear.  CT abdomen/pelvis negative for acute intra-abdominal issues.  Lipase normal 36.  May be due to her fibromyalgia ?-As needed fentanyl ?-Continue home Norco as needed ? ?Asthma ?- Bronchodilators, Singulair ? ?GERD (gastroesophageal reflux disease) ?- Protonix ? ?HLD (hyperlipidemia) ?- Crestor ? ?Depression with anxiety ?- Continue home medications ? ?OSA on CPAP ?- CPAP ? ?Type 2 diabetes mellitus without complications (HCC) ?Recent A1c >15.5, poorly controlled.  Patient is a taking 70/30 mixed insulin 30 units twice daily and metformin at home ?-70/30 insulin 20 unit twice daily ?-Sliding scale insulin ? ?Smoker ?- Nicotine patch ? ?Chronic pain syndrome ?- Continue home as needed Norco ?-Pregabalin ? ?Essential hypertension ?- IV hydralazine as needed ?-Continue home Cozaar, propranolol ? ? ?Pain control - [employer] Controlled Substance Reporting System database was reviewed. and patient was instructed, not to drive, operate heavy machinery,  perform activities at heights, swimming or participation in water activities or provide baby-sitting services while on Pain, Sleep and Anxiety Medications; until their outpatient Physician has advised to do so again. Also recommended to not to take more than prescribed Pain, Sleep and Anxiety Medications.  ? ?Consultants: None ?Procedures performed: None ?Disposition: Home ?Diet recommendation:  ?Discharge Diet Orders (From admission, onward)  ? ?  Start     Ordered  ? 07/12/21 0000  Diet - low sodium heart healthy       ? 07/12/21 1516  ? ?  ?  ? ?  ? ?Cardiac and Carb modified diet ?DISCHARGE MEDICATION: ?Allergies as of 07/12/2021   ? ?   Reactions  ? Tramadol Itching  ? Headaches and confusion.  ? Benadryl [diphenhydramine Hcl] Itching  ? Bextra [valdecoxib] Itching  ? Invokana [canagliflozin] Other (See Comments)  ? Causes yeast infection  ? Sulfamethoxazole-trimethoprim Other (See Comments)  ? "Makes me feel weird"  ? ?  ? ?  ?Medication List  ?  ? ?STOP taking these medications   ? ?Dulera 200-5 MCG/ACT Aero ?Generic drug: mometasone-formoterol ?  ?ibuprofen 800 MG tablet ?Commonly known as: ADVIL ?  ? ?  ? ?TAKE these medications   ? ?Accu-Chek Guide test strip ?Generic drug: glucose blood ?USE TID ?  ?albuterol 108 (90 Base) MCG/ACT inhaler ?Commonly known as: VENTOLIN HFA ?Inhale 1 puff  into the lungs 2 (two) times daily as needed. ?  ?Alive Once Daily Womens Tabs ?Take 1 tablet by mouth daily. ?  ?ARIPiprazole 5 MG tablet ?Commonly known as: ABILIFY ?Take 5 mg by mouth daily. ?  ?aspirin 81 MG tablet ?Take 81 mg by mouth daily. ?  ?budesonide-formoterol 160-4.5 MCG/ACT inhaler ?Commonly known as: SYMBICORT ?Inhale 2 puffs into the lungs 2 (two) times daily. ?  ?cetirizine 10 MG tablet ?Commonly known as: ZYRTEC ?Take 10 mg by mouth at bedtime. ?  ?ergocalciferol 1.25 MG (50000 UT) capsule ?Commonly known as: VITAMIN D2 ?TAKE 1 CAPSULE BY MOUTH 1 TIME A WEEK ?  ?famotidine 40 MG tablet ?Commonly known as:  PEPCID ?Take 20 mg by mouth daily. ?  ?fluticasone 50 MCG/ACT nasal spray ?Commonly known as: FLONASE ?Place 2 sprays into both nostrils daily. ?  ?HYDROcodone-acetaminophen 7.5-325 MG tablet ?Commonly known

## 2021-07-12 NOTE — Progress Notes (Signed)
*  PRELIMINARY RESULTS* ?Echocardiogram ?2D Echocardiogram has been performed. ? ?[name] ?07/12/2021, 8:45 AM ?

## 2021-07-12 NOTE — TOC CM/SW Note (Signed)
Patient has orders to discharge home today. Chart reviewed. PCP is Sara Chu, MD. Was on 3 L acute but RN just walked with her and she dropped to 94% on room air. No wounds. No TOC needs identified. CSW signing off. ? ?Dayton Scrape, [place] ?[phone] ? ?

## 2021-07-12 NOTE — Progress Notes (Signed)
Patient discharged to home with all belongings via transportation/family member. RN reviewed AVS and Medication schedule with patient. Patient expressed understanding with no follow-up concerns. Complaints were addressed during stay. PIVX1 removed with catheter intact. Patient has no concerns or complaint at time of discharge. ?

## 2021-07-12 NOTE — Hospital Course (Signed)
Taken from H&P. ? ?[name] is a 61 y.o. female with medical history significant of hypertension, hyperlipidemia, diabetes mellitus, asthma, GERD, depression, anxiety, OSA on CPAP, fibromyalgia, tobacco abuse, migraine headache, who presented with shortness of breath, rectal bleeding, abdominal pain. ?  ?Patient states that her shortness of breath that started on Friday, which has been persistent.  Patient has dry cough, no chest pain, fever or chills.  Patient states that she has 1 episode of rectal bleeding with dark stool on Friday.  She states she has abdominal pain, which is located in the right side of abdomen, constant, aching, 8 out of 10 in severity, aggravated by deep breath.  Patient has nausea, no vomiting.  Patient states that she had mild diarrhea on Friday, which has resolved.  Currently patient does not have diarrhea.  Denies symptoms of UTI. ?  ?Patient was found to have oxygen desaturation to 88 % on room air, which improved to 95% on 3 L oxygen.  Patient is not using oxygen normally. ? ? pt was found to have hemoglobin 15.8, WBC 7.5, BNP 11.6, troponin level 7 --> 6, negative COVID PCR, GFR > 60, temperature normal, blood pressure 121/89, heart rate 102, creatinine.  Chest x-ray showed bilateral segmental atelectasis.  CT scan of abdomen/pelvis is negative for acute intra-abdominal issues.  CT angiogram is negative for PE, showed possible pulmonary hypertension. ? ?Patient was able to wean off from oxygen next morning.  We ambulated her and oxygen saturation remained above 94%.  Patient with subjective improvement of shortness of breath with oxygen, no desaturation.  Patient did not had any bowel movement during her stay.  Hemoglobin remained stable.  Patient need to see gastroenterologist if experience more bleeding. ?CT abdomen pelvis was negative for any acute intra-abdominal issues.  Lipase was normal.  May be due to her fibromyalgia.  She can continue her home pain medication  regimen. ? ?Patient has no prior diagnosis of COPD, CT chest and echocardiogram with concern of pulmonary hypertension.  Echocardiogram was otherwise with normal EF. ?Does not appear to have any asthma or COPD exacerbation at this time.  She can continue her home bronchodilators and need to follow-up with pulmonology as an outpatient for further management of her pulmonary hypertension. ?She was counseled and provided with nicotine patch for smoking. ? ?Patient on multiple psych medications, she can continue but need to follow-up closely with her psychiatrist for med reconciliation and management. ? ?Patient was found to have uncontrolled type 2 diabetes mellitus with hyperglycemia and A1c above 15.5.  Patient admitted not using her 70/30 regularly. ?She was counseled for regular use of her insulin and have a close follow-up with PCP for further recommendations and better control of diabetes. ? ?We will continue the rest of her home medications and will follow-up with her providers. ? ?

## 2021-07-12 NOTE — Plan of Care (Signed)
?  Problem: Education: ?Goal: Knowledge of disease or condition will improve ?07/12/2021 1725 by Evelena Peat, RN ?Outcome: Completed/Met ?07/12/2021 0935 by Evelena Peat, RN ?Outcome: Progressing ?Goal: Knowledge of the prescribed therapeutic regimen will improve ?07/12/2021 1725 by Evelena Peat, RN ?Outcome: Completed/Met ?07/12/2021 0935 by Evelena Peat, RN ?Outcome: Progressing ?Goal: Individualized Educational Video(s) ?07/12/2021 1725 by Evelena Peat, RN ?Outcome: Completed/Met ?07/12/2021 0935 by Evelena Peat, RN ?Outcome: Progressing ?  ?Problem: Activity: ?Goal: Ability to tolerate increased activity will improve ?07/12/2021 1725 by Evelena Peat, RN ?Outcome: Completed/Met ?07/12/2021 0935 by Evelena Peat, RN ?Outcome: Progressing ?Goal: Will verbalize the importance of balancing activity with adequate rest periods ?07/12/2021 1725 by Evelena Peat, RN ?Outcome: Completed/Met ?07/12/2021 0935 by Evelena Peat, RN ?Outcome: Progressing ?  ?Problem: Education: ?Goal: Knowledge of General Education information will improve ?Description: Including pain rating scale, medication(s)/side effects and non-pharmacologic comfort measures ?07/12/2021 1725 by Evelena Peat, RN ?Outcome: Completed/Met ?07/12/2021 0935 by Evelena Peat, RN ?Outcome: Progressing ?  ?Problem: Health Behavior/Discharge Planning: ?Goal: Ability to manage health-related needs will improve ?07/12/2021 1725 by Evelena Peat, RN ?Outcome: Completed/Met ?07/12/2021 0935 by Evelena Peat, RN ?Outcome: Progressing ?  ?Problem: Activity: ?Goal: Risk for activity intolerance will decrease ?07/12/2021 1725 by Evelena Peat, RN ?Outcome: Completed/Met ?07/12/2021 0935 by Evelena Peat, RN ?Outcome: Progressing ?  ?Problem: Elimination: ?Goal: Will not experience complications related to bowel motility ?07/12/2021 1725 by Evelena Peat, RN ?Outcome: Completed/Met ?07/12/2021 0935  by Evelena Peat, RN ?Outcome: Progressing ?Goal: Will not experience complications related to urinary retention ?07/12/2021 1725 by Evelena Peat, RN ?Outcome: Completed/Met ?07/12/2021 0935 by Evelena Peat, RN ?Outcome: Progressing ?  ?Problem: Safety: ?Goal: Ability to remain free from injury will improve ?07/12/2021 1725 by Evelena Peat, RN ?Outcome: Completed/Met ?07/12/2021 0935 by Evelena Peat, RN ?Outcome: Progressing ?  ?Problem: Skin Integrity: ?Goal: Risk for impaired skin integrity will decrease ?07/12/2021 1725 by Evelena Peat, RN ?Outcome: Completed/Met ?07/12/2021 0935 by Evelena Peat, RN ?Outcome: Progressing ?  ?

## 2021-07-26 ENCOUNTER — Telehealth: Payer: Self-pay | Admitting: Student in an Organized Health Care Education/Training Program

## 2021-07-26 ENCOUNTER — Other Ambulatory Visit: Payer: Self-pay | Admitting: *Deleted

## 2021-07-26 DIAGNOSIS — G8929 Other chronic pain: Secondary | ICD-10-CM

## 2021-07-26 DIAGNOSIS — M1712 Unilateral primary osteoarthritis, left knee: Secondary | ICD-10-CM

## 2021-07-26 DIAGNOSIS — M1711 Unilateral primary osteoarthritis, right knee: Secondary | ICD-10-CM

## 2021-07-26 DIAGNOSIS — M5416 Radiculopathy, lumbar region: Secondary | ICD-10-CM

## 2021-07-26 DIAGNOSIS — F1190 Opioid use, unspecified, uncomplicated: Secondary | ICD-10-CM

## 2021-07-26 DIAGNOSIS — G894 Chronic pain syndrome: Secondary | ICD-10-CM

## 2021-07-26 MED ORDER — HYDROCODONE-ACETAMINOPHEN 7.5-325 MG PO TABS: 1.0000 | ORAL_TABLET | Freq: Three times a day (TID) | ORAL | 0 refills | Status: DC | PRN

## 2021-07-31 ENCOUNTER — Other Ambulatory Visit: Payer: Self-pay | Admitting: Specialist

## 2021-07-31 DIAGNOSIS — J9811 Atelectasis: Secondary | ICD-10-CM

## 2021-07-31 DIAGNOSIS — R0602 Shortness of breath: Secondary | ICD-10-CM

## 2021-08-02 NOTE — Telephone Encounter (Signed)
Attempt to call patient, no answer and voicemail has not been setup.  ?

## 2021-08-05 NOTE — Progress Notes (Signed)
[facility]  [phone] [phone]  ID: [name] OB: Jan 16, 1961  MR#: [id number]  UXL#:[id number]  Patient Care Team: Dan Humphreys, MD as PCP - General (Family Medicine) Jeralyn Ruths, MD as Consulting Physician (Oncology)  I connected with [name] on 08/10/21 at  3:30 PM EDT by video enabled telemedicine visit and verified that I am speaking with the correct person using two identifiers.   I discussed the limitations, risks, security and privacy concerns of performing an evaluation and management service by telemedicine and the availability of in-person appointments. I also discussed with the patient that there may be a patient responsible charge related to this service. The patient expressed understanding and agreed to proceed.   Other persons participating in the visit and their role in the encounter: Patient, MD.  Patient's location: Home. Provider's location: Clinic.  CHIEF COMPLAINT: Leukocytosis and polycythemia.  INTERVAL HISTORY: Patient agreed to video assisted telemedicine visit for further evaluation and discussion of her laboratory results.  She continues to feel well and remains asymptomatic. She has no neurologic complaints.  She denies any recent fevers.  She continues to smoke, but expressed an interest in quitting.  She has good appetite and denies weight loss.  She has no chest pain, shortness of breath, cough, or hemoptysis.  She denies any nausea, vomiting, constipation, or diarrhea.  She has no urinary complaints.  Patient offers no specific complaints today.    REVIEW OF SYSTEMS:   Review of Systems  Constitutional: Negative.  Negative for fever, malaise/fatigue and weight loss.  Respiratory: Negative.  Negative for cough, hemoptysis and shortness of breath.   Cardiovascular: Negative.  Negative for chest pain and leg swelling.  Gastrointestinal: Negative.  Negative for abdominal pain.   Genitourinary: Negative.  Negative for dysuria.  Musculoskeletal: Negative.  Negative for back pain.  Skin: Negative.  Negative for rash.  Neurological: Negative.  Negative for dizziness, focal weakness, weakness and headaches.  Psychiatric/Behavioral: Negative.  The patient is not nervous/anxious.    As per HPI. Otherwise, a complete review of systems is negative.  PAST MEDICAL HISTORY: Past Medical History:  Diagnosis Date   Abnormal CBC    Asthma    Asthma    BPPV (benign paroxysmal positional vertigo)    none recently   Cardiac murmur, unspecified 08/28/2016   COPD (chronic obstructive pulmonary disease) (HCC)    CPAP (continuous positive airway pressure) dependence    Degenerative disc disease, lumbar    Depression    Diabetes mellitus    Diverticulitis 10/2016   Elevated LFTs    Fatty liver    Fibromyalgia    Fibromyalgia    GERD (gastroesophageal reflux disease)    Headache    migraines.  None since starting topamax   History of degenerative disc disease    Hyperlipidemia    Hypertension    Sleep apnea    CPAP    PAST SURGICAL HISTORY: Past Surgical History:  Procedure Laterality Date   ABDOMINAL HYSTERECTOMY     ABDOMINAL SURGERY     CATARACT EXTRACTION W/PHACO Left 09/07/2019   Procedure: CATARACT EXTRACTION PHACO AND INTRAOCULAR LENS PLACEMENT (IOC) LEFT DIABETIC;  Surgeon: Galen Manila, MD;  Location: [facility];  Service: Ophthalmology;  Laterality: Left;  4.01 0:43.8   CATARACT EXTRACTION W/PHACO Right 09/28/2019   Procedure: CATARACT EXTRACTION PHACO AND INTRAOCULAR LENS PLACEMENT (IOC) RIGHT DIABETIC 4.16  00:43.3;  Surgeon: Galen Manila, MD;  Location: [facility];  Service:  Ophthalmology;  Laterality: Right;  Diabetic - insulin and oral meds   CHOLECYSTECTOMY N/A 04/27/2018   Procedure: LAPAROSCOPIC CHOLECYSTECTOMY;  Surgeon: Carolan Shiver, MD;  Location: [facility];  Service: General;  Laterality: N/A;   COLONOSCOPY WITH  PROPOFOL N/A 02/20/2017   Procedure: COLONOSCOPY WITH PROPOFOL;  Surgeon: Christena Deem, MD;  Location: [facility];  Service: Endoscopy;  Laterality: N/A;   Diverticulitis     ESOPHAGOGASTRODUODENOSCOPY (EGD) WITH PROPOFOL N/A 02/20/2017   Procedure: ESOPHAGOGASTRODUODENOSCOPY (EGD) WITH PROPOFOL;  Surgeon: Christena Deem, MD;  Location: [facility];  Service: Endoscopy;  Laterality: N/A;   EUS N/A 01/08/2018   Procedure: UPPER ENDOSCOPIC ULTRASOUND (EUS) RADIAL;  Surgeon: Bearl Mulberry, MD;  Location: [facility];  Service: Gastroenterology;  Laterality: N/A;   UPPER ESOPHAGEAL ENDOSCOPIC ULTRASOUND (EUS) N/A 04/03/2017   Procedure: UPPER ESOPHAGEAL ENDOSCOPIC ULTRASOUND (EUS);  Surgeon: Bearl Mulberry, MD;  Location: [facility] ENDOSCOPY;  Service: Gastroenterology;  Laterality: N/A;    FAMILY HISTORY: Family History  Problem Relation Age of Onset   Diabetes Mother    Hypertension Mother     ADVANCED DIRECTIVES (Y/N):  N  HEALTH MAINTENANCE: Social History   Tobacco Use   Smoking status: Every Day    Packs/day: 0.50    Years: 14.00    Pack years: 7.00    Types: Cigarettes    Passive exposure: Current   Smokeless tobacco: Former   Tobacco comments:    had recently quit for about 1 yr.  restarted several months ago  Vaping Use   Vaping Use: Former  Substance Use Topics   Alcohol use: Yes    Alcohol/week: 2.0 standard drinks    Types: 1 Glasses of wine, 1 Cans of beer per week   Drug use: Not Currently    Comment: 4 years ago used Crack     Colonoscopy:  PAP:  Bone density:  Lipid panel:  Allergies  Allergen Reactions   Tramadol Itching    Headaches and confusion.   Benadryl [Diphenhydramine Hcl] Itching   Bextra [Valdecoxib] Itching   Invokana [Canagliflozin] Other (See Comments)    Causes yeast infection   Sulfamethoxazole-Trimethoprim Other (See Comments)    "Makes me feel weird"    Current Outpatient Medications  Medication Sig  Dispense Refill   ACCU-CHEK GUIDE test strip USE TID  12   albuterol (PROVENTIL HFA;VENTOLIN HFA) 108 (90 Base) MCG/ACT inhaler Inhale 1 puff into the lungs 2 (two) times daily as needed.     ARIPiprazole (ABILIFY) 5 MG tablet Take 5 mg by mouth daily.     aspirin 81 MG tablet Take 81 mg by mouth daily.      budesonide-formoterol (SYMBICORT) 160-4.5 MCG/ACT inhaler Inhale 2 puffs into the lungs 2 (two) times daily.     cetirizine (ZYRTEC) 10 MG tablet Take 10 mg by mouth at bedtime.     ergocalciferol (VITAMIN D2) 1.25 MG (50000 UT) capsule TAKE 1 CAPSULE BY MOUTH 1 TIME A WEEK     famotidine (PEPCID) 40 MG tablet Take 20 mg by mouth daily.     fluticasone (FLONASE) 50 MCG/ACT nasal spray Place 2 sprays into both nostrils daily.     HYDROcodone-acetaminophen (NORCO) 7.5-325 MG tablet Take 1 tablet by mouth 3 (three) times daily as needed for severe pain. Must last 30 days 90 tablet 0   insulin aspart protamine - aspart (NOVOLOG 70/30 MIX) (70-30) 100 UNIT/ML FlexPen Inject 30 Units into the skin 2 (two) times daily with a meal. 15  mL 1   Insulin Pen Needle 31G X 5 MM MISC 1 Units by Does not apply route in the morning and at bedtime. 100 each 0   lidocaine (LIDODERM) 5 % Place 1 patch onto the skin daily. Remove & Discard patch within 12 hours or as directed by MD 30 patch 0   LINZESS 290 MCG CAPS capsule Take 290 mcg by mouth daily.     losartan (COZAAR) 100 MG tablet Take 50 mg by mouth daily.     meclizine (ANTIVERT) 25 MG tablet Take 25 mg by mouth 2 (two) times daily as needed for dizziness or nausea.     metFORMIN (GLUCOPHAGE) 1000 MG tablet Take 1,000 mg by mouth 2 (two) times daily.      mirtazapine (REMERON) 45 MG tablet Take 45 mg by mouth at bedtime.     montelukast (SINGULAIR) 10 MG tablet Take 1 tablet by mouth daily.     Multiple Vitamins-Minerals (ALIVE ONCE DAILY WOMENS) TABS Take 1 tablet by mouth daily.     ondansetron (ZOFRAN) 4 MG tablet Take 4 mg by mouth every 8 (eight)  hours as needed for nausea or vomiting.  0   pregabalin (LYRICA) 100 MG capsule Take 1 capsule (100 mg total) by mouth 3 (three) times daily. (Patient taking differently: Take 100 mg by mouth 2 (two) times daily.) 90 capsule 2   propranolol (INDERAL) 40 MG tablet Take 40 mg by mouth 2 (two) times daily.     rizatriptan (MAXALT) 10 MG tablet Take 10 mg by mouth as needed for migraine. May repeat in 2 hours if needed     rosuvastatin (CRESTOR) 20 MG tablet Take 20 mg by mouth daily.     solifenacin (VESICARE) 10 MG tablet Take 1 tablet by mouth daily.     tiotropium (SPIRIVA) 18 MCG inhalation capsule Place into inhaler and inhale.     topiramate (TOPAMAX) 25 MG tablet Take 25 mg by mouth 2 (two) times daily.     traZODone (DESYREL) 150 MG tablet Take 150 mg by mouth at bedtime.     venlafaxine XR (EFFEXOR-XR) 150 MG 24 hr capsule Take 150 mg by mouth daily.     nicotine (NICODERM CQ - DOSED IN MG/24 HOURS) 21 mg/24hr patch Place 1 patch (21 mg total) onto the skin daily. (Patient not taking: Reported on 08/09/2021) 28 patch 0   No current facility-administered medications for this visit.    OBJECTIVE: There were no vitals filed for this visit.    There is no height or weight on file to calculate BMI.    ECOG FS:0 - Asymptomatic  General: Well-developed, well-nourished, no acute distress. HEENT: Normocephalic. Neuro: Alert, answering all questions appropriately. Cranial nerves grossly intact. Psych: Normal affect.  LAB RESULTS:  Lab Results  Component Value Date   NA 137 07/11/2021   K 4.0 07/11/2021   CL 103 07/11/2021   CO2 23 07/11/2021   GLUCOSE 454 (H) 07/11/2021   BUN 9 07/11/2021   CREATININE 0.81 07/11/2021   CALCIUM 9.6 07/11/2021   PROT 7.6 07/11/2021   ALBUMIN 3.9 07/11/2021   AST 14 (L) 07/11/2021   ALT 30 07/11/2021   ALKPHOS 117 07/11/2021   BILITOT 1.1 07/11/2021   GFRNONAA >60 07/11/2021   GFRAA >60 08/09/2019    Lab Results  Component Value Date   WBC 9.0  07/12/2021   NEUTROABS 7.6 04/04/2021   HGB 13.4 07/12/2021   HCT 42.6 07/12/2021   MCV 88.2 07/12/2021  PLT 279 07/12/2021     STUDIES: ECHOCARDIOGRAM COMPLETE  Result Date: 07/12/2021    ECHOCARDIOGRAM REPORT   Patient Name:   [name] Date of Exam: 07/12/2021 Medical Rec #:  [id number]            Height:       59.0 in Accession #:    [id number]           Weight:       120.0 lb Date of Birth:  01-14-1961            BSA:          1.484 m Patient Age:    60 years             BP:           118/82 mmHg Patient Gender: F                    HR:           72 bpm. Exam Location:  [facility] Procedure: 2D Echo, Color Doppler and Cardiac Doppler Indications:     I27.2 Pulmonary hypertension  History:         Patient has no prior history of Echocardiogram examinations.                  Risk Factors:Hypertension, Diabetes, Dyslipidemia and Sleep                  Apnea.  Sonographer:     Humphrey Rolls Referring Phys:  [id number] Lorretta Harp Diagnosing Phys: Julien Nordmann MD IMPRESSIONS  1. Left ventricular ejection fraction, by estimation, is 50 to 55%. The left ventricle has low normal function. The left ventricle has no regional wall motion abnormalities. Left ventricular diastolic parameters are consistent with Grade II diastolic dysfunction (pseudonormalization).  2. Right ventricular systolic function is moderately reduced. The right ventricular size is moderately enlarged. There is normal pulmonary artery systolic pressure. The estimated right ventricular systolic pressure is 33.6 mmHg.  3. The mitral valve is normal in structure. No evidence of mitral valve regurgitation. No evidence of mitral stenosis.  4. The aortic valve is normal in structure. Aortic valve regurgitation is not visualized. No aortic stenosis is present.  5. The inferior vena cava is dilated in size with >50% respiratory variability, suggesting right atrial pressure of 8 mmHg. FINDINGS  Left Ventricle: Left ventricular ejection fraction, by  estimation, is 50 to 55%. The left ventricle has low normal function. The left ventricle has no regional wall motion abnormalities. The left ventricular internal cavity size was normal in size. There is no left ventricular hypertrophy. Left ventricular diastolic parameters are consistent with Grade II diastolic dysfunction (pseudonormalization). Right Ventricle: The right ventricular size is moderately enlarged. No increase in right ventricular wall thickness. Right ventricular systolic function is moderately reduced. There is normal pulmonary artery systolic pressure. The tricuspid regurgitant velocity is 2.43 m/s, and with an assumed right atrial pressure of 10 mmHg, the estimated right ventricular systolic pressure is 33.6 mmHg. Left Atrium: Left atrial size was normal in size. Right Atrium: Right atrial size was normal in size. Pericardium: There is no evidence of pericardial effusion. Mitral Valve: The mitral valve is normal in structure. No evidence of mitral valve regurgitation. No evidence of mitral valve stenosis. MV peak gradient, 2.3 mmHg. The mean mitral valve gradient is 1.0 mmHg. Tricuspid Valve: The tricuspid valve is normal in structure. Tricuspid valve regurgitation is mild . No  evidence of tricuspid stenosis. Aortic Valve: The aortic valve is normal in structure. Aortic valve regurgitation is not visualized. No aortic stenosis is present. Aortic valve mean gradient measures 2.0 mmHg. Aortic valve peak gradient measures 3.7 mmHg. Aortic valve area, by VTI measures 2.29 cm. Pulmonic Valve: The pulmonic valve was normal in structure. Pulmonic valve regurgitation is trivial. No evidence of pulmonic stenosis. Aorta: The aortic root is normal in size and structure. Venous: The inferior vena cava is dilated in size with greater than 50% respiratory variability, suggesting right atrial pressure of 8 mmHg. IAS/Shunts: No atrial level shunt detected by color flow Doppler.  LEFT VENTRICLE PLAX 2D LVIDd:          2.87 cm   Diastology LVIDs:         2.01 cm   LV e' medial:    6.42 cm/s LV PW:         0.97 cm   LV E/e' medial:  11.4 LV IVS:        0.76 cm   LV e' lateral:   6.85 cm/s LVOT diam:     2.00 cm   LV E/e' lateral: 10.7 LV SV:         38 LV SV Index:   26 LVOT Area:     3.14 cm  RIGHT VENTRICLE RV Basal diam:  3.14 cm RV S prime:     11.00 cm/s LEFT ATRIUM             Index        RIGHT ATRIUM           Index LA diam:        2.90 cm 1.95 cm/m   RA Area:     10.50 cm LA Vol (A2C):   33.2 ml 22.37 ml/m  RA Volume:   21.40 ml  14.42 ml/m LA Vol (A4C):   15.4 ml 10.37 ml/m LA Biplane Vol: 23.6 ml 15.90 ml/m  AORTIC VALVE                    PULMONIC VALVE AV Area (Vmax):    2.73 cm     PV Vmax:       0.65 m/s AV Area (Vmean):   2.41 cm     PV Vmean:      45.200 cm/s AV Area (VTI):     2.29 cm     PV VTI:        0.111 m AV Vmax:           96.10 cm/s   PV Peak grad:  1.7 mmHg AV Vmean:          66.900 cm/s  PV Mean grad:  1.0 mmHg AV VTI:            0.166 m AV Peak Grad:      3.7 mmHg AV Mean Grad:      2.0 mmHg LVOT Vmax:         83.60 cm/s LVOT Vmean:        51.300 cm/s LVOT VTI:          0.121 m LVOT/AV VTI ratio: 0.73  AORTA Ao Root diam: 2.90 cm MITRAL VALVE               TRICUSPID VALVE MV Area (PHT): 3.48 cm    TR Peak grad:   23.6 mmHg MV Area VTI:   1.90 cm    TR Vmax:  243.00 cm/s MV Peak grad:  2.3 mmHg MV Mean grad:  1.0 mmHg    SHUNTS MV Vmax:       0.76 m/s    Systemic VTI:  0.12 m MV Vmean:      53.0 cm/s   Systemic Diam: 2.00 cm MV Decel Time: 218 msec MV E velocity: 73.30 cm/s MV A velocity: 76.30 cm/s MV E/A ratio:  0.Julien NordmannGollan MD Electronically signed by Julien Nordmann MD Signature Date/Time: 07/12/2021/10:20:09 AM    Final     ASSESSMENT: Leukocytosis and polycythemia.  PLAN:    Leukocytosis: Resolved.  Patient's most her blood cell count is 9.4.  Possibly reactive related to recent infection.  Peripheral blood flow cytometry is negative. Polycythemia: Likely secondary  to tobacco use.  Patient's hemoglobin is only mildly elevated at 15.4.  Carbon monoxide levels are elevated at 90.6%.  All of her other laboratory work including iron stores are either negative or within normal limits including JAK2 mutation.  No intervention is needed.  Patient does not require phlebotomy or other treatment.   Smoking cessation: Recommended patient to further discuss with primary care tactics to help quit. Disposition: No follow-up is necessary.  I provided 20 minutes of face-to-face video visit time during this encounter which included chart review, counseling, and coordination of care as documented above.   Patient expressed understanding and was in agreement with this plan. She also understands that She can call clinic at any time with any questions, concerns, or complaints.    Jeralyn Ruths, MD   08/10/2021 2:51 PM

## 2021-08-09 ENCOUNTER — Inpatient Hospital Stay: Payer: Medicaid Other | Attending: Oncology | Admitting: Oncology

## 2021-08-09 ENCOUNTER — Encounter: Payer: Self-pay | Admitting: Oncology

## 2021-08-09 DIAGNOSIS — D72829 Elevated white blood cell count, unspecified: Secondary | ICD-10-CM

## 2021-08-09 DIAGNOSIS — D751 Secondary polycythemia: Secondary | ICD-10-CM

## 2021-08-10 ENCOUNTER — Ambulatory Visit
Admission: RE | Admit: 2021-08-10 | Discharge: 2021-08-10 | Disposition: A | Discharge status: Home / Self Care | Payer: Medicaid Other | Source: Ambulatory Visit | Attending: Specialist | Admitting: Specialist

## 2021-08-10 DIAGNOSIS — J9811 Atelectasis: Secondary | ICD-10-CM | POA: Diagnosis present

## 2021-08-10 DIAGNOSIS — R0602 Shortness of breath: Secondary | ICD-10-CM | POA: Diagnosis present

## 2021-08-24 ENCOUNTER — Other Ambulatory Visit: Payer: Self-pay

## 2021-08-24 ENCOUNTER — Telehealth: Payer: Self-pay | Admitting: Student in an Organized Health Care Education/Training Program

## 2021-08-24 DIAGNOSIS — M1712 Unilateral primary osteoarthritis, left knee: Secondary | ICD-10-CM

## 2021-08-24 DIAGNOSIS — M5416 Radiculopathy, lumbar region: Secondary | ICD-10-CM

## 2021-08-24 DIAGNOSIS — G894 Chronic pain syndrome: Secondary | ICD-10-CM

## 2021-08-24 DIAGNOSIS — F1190 Opioid use, unspecified, uncomplicated: Secondary | ICD-10-CM

## 2021-08-24 DIAGNOSIS — M1711 Unilateral primary osteoarthritis, right knee: Secondary | ICD-10-CM

## 2021-08-24 DIAGNOSIS — G8929 Other chronic pain: Secondary | ICD-10-CM

## 2021-08-24 NOTE — Telephone Encounter (Signed)
Pateint lvmail [phone]- [phone] stating her [facility] does not have her meds, wants to get them sent to the other pharmacy. Please call patient

## 2021-08-24 NOTE — Telephone Encounter (Signed)
Prescription at [facility] have been cancelled and the last fill on [id number] was cancelled and not resent.  Refill request sent to Dr Holley Raring for the Hydrocodone to [facility],.

## 2021-08-24 NOTE — Telephone Encounter (Signed)
Patient will call back with a pharmacy after she checks to find which one has meds in stock.

## 2021-08-27 ENCOUNTER — Telehealth: Payer: Self-pay | Admitting: Student in an Organized Health Care Education/Training Program

## 2021-08-27 ENCOUNTER — Ambulatory Visit (INDEPENDENT_AMBULATORY_CARE_PROVIDER_SITE_OTHER): Payer: Medicaid Other | Admitting: Internal Medicine

## 2021-08-27 VITALS — BP 137/86 | HR 109 | Resp 14 | Ht 59.0 in | Wt 116.0 lb

## 2021-08-27 DIAGNOSIS — I10 Essential (primary) hypertension: Secondary | ICD-10-CM | POA: Diagnosis not present

## 2021-08-27 DIAGNOSIS — Z9989 Dependence on other enabling machines and devices: Secondary | ICD-10-CM | POA: Diagnosis not present

## 2021-08-27 DIAGNOSIS — G4733 Obstructive sleep apnea (adult) (pediatric): Secondary | ICD-10-CM

## 2021-08-27 DIAGNOSIS — Z7189 Other specified counseling: Secondary | ICD-10-CM

## 2021-08-27 MED ORDER — HYDROCODONE-ACETAMINOPHEN 7.5-325 MG PO TABS: 1.0000 | ORAL_TABLET | Freq: Three times a day (TID) | ORAL | 0 refills | Status: DC | PRN

## 2021-08-27 NOTE — Telephone Encounter (Signed)
Patients wants her med changed to [facility] on [facility]. Please give patient a call. Thanks

## 2021-08-27 NOTE — Progress Notes (Unsigned)
[facility] [place], [place] [address]  Pulmonary Sleep Medicine   Office Visit Note  Patient Name: [name] DOB: May 27, 1960 MRN [id number]    Chief Complaint: Obstructive Sleep Apnea visit  Brief History:  [name] is seen today for follow up. The patient has a 3 year history of sleep apnea. Patient is not  using PAP nightly with small F20 full face mask.. The patient reports not using PAP use. Epworth Sleepiness Score is 0 out of 24. The patient DOES NOT take naps. The patient complains of the followng: not comfortable using CPAP and want to try another treatment alternative.  The compliance download shows  compliance with an average use time of  16 minutes per hour @ 3%. The AHI is 2.1  The patient does complain of limb movements disrupting sleep. She has seen pulmonologist and had evaluation for her lung disease, and was prescribed oxygen for use 24/7. She is not currently wearing it.   ROS  General: (-) fever, (-) chills, (-) night sweat Nose and Sinuses: (-) nasal stuffiness or itchiness, (-) postnasal drip, (-) nosebleeds, (-) sinus trouble. Mouth and Throat: (-) sore throat, (-) hoarseness. Neck: (-) swollen glands, (-) enlarged thyroid, (-) neck pain. Respiratory: + cough, + shortness of breath, + wheezing. Neurologic: - numbness, - tingling. Psychiatric: - anxiety, + depression   Current Medication: Outpatient Encounter Medications as of 08/27/2021  Medication Sig Note   ACCU-CHEK GUIDE test strip USE TID    albuterol (PROVENTIL HFA;VENTOLIN HFA) 108 (90 Base) MCG/ACT inhaler Inhale 1 puff into the lungs 2 (two) times daily as needed. 07/11/2021: LF 05-23-2021    ARIPiprazole (ABILIFY) 5 MG tablet Take 5 mg by mouth daily. 07/11/2021: LF 06-12-2021 30days   aspirin 81 MG tablet Take 81 mg by mouth daily.     budesonide-formoterol (SYMBICORT) 160-4.5 MCG/ACT inhaler Inhale 2 puffs into the lungs 2 (two) times daily.    cetirizine (ZYRTEC)  10 MG tablet Take 10 mg by mouth at bedtime. 07/11/2021: LF 05-17-2021 90days   ergocalciferol (VITAMIN D2) 1.25 MG (50000 UT) capsule TAKE 1 CAPSULE BY MOUTH 1 TIME A WEEK 04/05/2021: LF 01/23/2021 1.25 MG (50000 UNIT) CAPS (disp 12, 84d supply)    famotidine (PEPCID) 40 MG tablet Take 20 mg by mouth daily. 04/05/2021: LF 01/17/2021 20 MG TABS (disp 180, 90d supply)    fluticasone (FLONASE) 50 MCG/ACT nasal spray Place 2 sprays into both nostrils daily. 07/11/2021: LF 05-22-2021 90days   HYDROcodone-acetaminophen (NORCO) 7.5-325 MG tablet Take 1 tablet by mouth 3 (three) times daily as needed for severe pain. Must last 30 days    insulin aspart protamine - aspart (NOVOLOG 70/30 MIX) (70-30) 100 UNIT/ML FlexPen Inject 30 Units into the skin 2 (two) times daily with a meal. 07/11/2021: LF 04-09-2021 23days   Insulin Pen Needle 31G X 5 MM MISC 1 Units by Does not apply route in the morning and at bedtime.    lidocaine (LIDODERM) 5 % Place 1 patch onto the skin daily. Remove & Discard patch within 12 hours or as directed by MD    LINZESS 290 MCG CAPS capsule Take 290 mcg by mouth daily. 07/11/2021: LF 04-12-2021 90days   losartan (COZAAR) 100 MG tablet Take 50 mg by mouth daily. 07/11/2021: LF 06-25-2021 90days   meclizine (ANTIVERT) 25 MG tablet Take 25 mg by mouth 2 (two) times daily as needed for dizziness or nausea.    metFORMIN (GLUCOPHAGE) 1000 MG tablet Take 1,000 mg  by mouth 2 (two) times daily.  04/05/2021: LF 01/17/2021 1000 MG TABS (disp 180, 90d supply    mirtazapine (REMERON) 45 MG tablet Take 45 mg by mouth at bedtime. 07/11/2021: LF 05-17-2021 90days   montelukast (SINGULAIR) 10 MG tablet Take 1 tablet by mouth daily. 04/05/2021: LF 01/17/2021 10 MG TABS (disp 90, 90d supply)    Multiple Vitamins-Minerals (ALIVE ONCE DAILY WOMENS) TABS Take 1 tablet by mouth daily.    nicotine (NICODERM CQ - DOSED IN MG/24 HOURS) 21 mg/24hr patch Place 1 patch (21 mg total) onto the skin daily. (Patient not taking:  Reported on 08/09/2021)    ondansetron (ZOFRAN) 4 MG tablet Take 4 mg by mouth every 8 (eight) hours as needed for nausea or vomiting.    pregabalin (LYRICA) 100 MG capsule Take 1 capsule (100 mg total) by mouth 3 (three) times daily. (Patient taking differently: Take 100 mg by mouth 2 (two) times daily.) 07/11/2021: LF 06-26-2021 30days   propranolol (INDERAL) 40 MG tablet Take 40 mg by mouth 2 (two) times daily. 07/11/2021: LF 06-18-2021 90days   rizatriptan (MAXALT) 10 MG tablet Take 10 mg by mouth as needed for migraine. May repeat in 2 hours if needed    rosuvastatin (CRESTOR) 20 MG tablet Take 20 mg by mouth daily. 07/11/2021: Needs refill   solifenacin (VESICARE) 10 MG tablet Take 1 tablet by mouth daily.    tiotropium (SPIRIVA) 18 MCG inhalation capsule Place into inhaler and inhale.    topiramate (TOPAMAX) 25 MG tablet Take 25 mg by mouth 2 (two) times daily. 04/05/2021: LF 08/02/2020 25 MG TABS (disp 180, 90d supply)    traZODone (DESYREL) 150 MG tablet Take 150 mg by mouth at bedtime. 07/11/2021: LF 05-08-2021 90days   venlafaxine XR (EFFEXOR-XR) 150 MG 24 hr capsule Take 150 mg by mouth daily. 07/11/2021: LF 05-23-2021 30days   No facility-administered encounter medications on file as of 08/27/2021.    Surgical History: Past Surgical History:  Procedure Laterality Date   ABDOMINAL HYSTERECTOMY     ABDOMINAL SURGERY     CATARACT EXTRACTION W/PHACO Left 09/07/2019   Procedure: CATARACT EXTRACTION PHACO AND INTRAOCULAR LENS PLACEMENT ([place]) LEFT DIABETIC;  Surgeon: Birder Robson, MD;  Location: [place];  Service: Ophthalmology;  Laterality: Left;  4.01 0:43.8   CATARACT EXTRACTION W/PHACO Right 09/28/2019   Procedure: CATARACT EXTRACTION PHACO AND INTRAOCULAR LENS PLACEMENT ([facility]) RIGHT DIABETIC 4.16  00:43.3;  Surgeon: Birder Robson, MD;  Location: [place];  Service: Ophthalmology;  Laterality: Right;  Diabetic - insulin and oral meds   CHOLECYSTECTOMY N/A 04/27/2018    Procedure: LAPAROSCOPIC CHOLECYSTECTOMY;  Surgeon: Herbert Pun, MD;  Location: [facility];  Service: General;  Laterality: N/A;   COLONOSCOPY WITH PROPOFOL N/A 02/20/2017   Procedure: COLONOSCOPY WITH PROPOFOL;  Surgeon: Lollie Sails, MD;  Location: [facility] ENDOSCOPY;  Service: Endoscopy;  Laterality: N/A;   Diverticulitis     ESOPHAGOGASTRODUODENOSCOPY (EGD) WITH PROPOFOL N/A 02/20/2017   Procedure: ESOPHAGOGASTRODUODENOSCOPY (EGD) WITH PROPOFOL;  Surgeon: Lollie Sails, MD;  Location: [facility] ENDOSCOPY;  Service: Endoscopy;  Laterality: N/A;   EUS N/A 01/08/2018   Procedure: UPPER ENDOSCOPIC ULTRASOUND (EUS) RADIAL;  Surgeon: Holly Bodily, MD;  Location: [facility] ENDOSCOPY;  Service: Gastroenterology;  Laterality: N/A;   UPPER ESOPHAGEAL ENDOSCOPIC ULTRASOUND (EUS) N/A 04/03/2017   Procedure: UPPER ESOPHAGEAL ENDOSCOPIC ULTRASOUND (EUS);  Surgeon: Holly Bodily, MD;  Location: [facility] ENDOSCOPY;  Service: Gastroenterology;  Laterality: N/A;    Medical History: Past Medical History:  Diagnosis Date   Abnormal CBC    Asthma    Asthma    BPPV (benign paroxysmal positional vertigo)    none recently   Cardiac murmur, unspecified 08/28/2016   COPD (chronic obstructive pulmonary disease) (HCC)    CPAP (continuous positive airway pressure) dependence    Degenerative disc disease, lumbar    Depression    Diabetes mellitus    Diverticulitis 10/2016   Elevated LFTs    Fatty liver    Fibromyalgia    Fibromyalgia    GERD (gastroesophageal reflux disease)    Headache    migraines.  None since starting topamax   History of degenerative disc disease    Hyperlipidemia    Hypertension    Sleep apnea    CPAP    Family History: Non contributory to the present illness  Social History: Social History   Socioeconomic History   Marital status: Divorced    Spouse name: Not on file   Number of children: Not on file   Years of education: Not on file   Highest education  level: Not on file  Occupational History   Not on file  Tobacco Use   Smoking status: Every Day    Packs/day: 0.50    Years: 14.00    Pack years: 7.00    Types: Cigarettes    Passive exposure: Current   Smokeless tobacco: Former   Tobacco comments:    had recently quit for about 1 yr.  restarted several months ago  Vaping Use   Vaping Use: Former  Substance and Sexual Activity   Alcohol use: Yes    Alcohol/week: 2.0 standard drinks    Types: 1 Glasses of wine, 1 Cans of beer per week   Drug use: Not Currently    Comment: 4 years ago used Crack   Sexual activity: Not on file  Other Topics Concern   Not on file  Social History Narrative   Not on file   Social Determinants of Health   Financial Resource Strain: Not on file  Food Insecurity: Not on file  Transportation Needs: Not on file  Physical Activity: Not on file  Stress: Not on file  Social Connections: Not on file  Intimate Partner Violence: Not on file    Vital Signs: Blood pressure 137/86, pulse (!) 109, resp. rate 14, height '4\' 11"'  (1.499 m), weight 116 lb (52.6 kg), SpO2 93 %. Body mass index is 23.43 kg/m.    Examination: General Appearance: The patient is well-developed, well-nourished, and in no distress. Neck Circumference: 35 cm Skin: Gross inspection of skin unremarkable. Head: normocephalic, no gross deformities. Eyes: no gross deformities noted. ENT: ears appear grossly normal Neurologic: Alert and oriented. No involuntary movements.    EPWORTH SLEEPINESS SCALE:  Scale:  (0)= no chance of dozing; (1)= slight chance of dozing; (2)= moderate chance of dozing; (3)= high chance of dozing  Chance  Situtation    Sitting and reading: 0    Watching TV: 0    Sitting Inactive in public: 0    As a passenger in car: 0      Lying down to rest: 0    Sitting and talking: 0    Sitting quielty after lunch: 0    In a car, stopped in traffic: 0   TOTAL SCORE:   0 out of 24    SLEEP  STUDIES:  08/12/18 PSG - AHI 6.0, left lateral AHI 31.9, low SpO2 78%   CPAP COMPLIANCE DATA:  Date Range: 06/26/21 - 08/24/21  Average Daily Use: 16 minutes/ hours  Median Use: 3:55  Compliance for > 4 Hours: 3%  AHI: 2.1 respiratory events per hour  Days Used: 4/60  Mask Leak: 7.5 lpm  95th Percentile Pressure: 7 cmH2O   LABS: Recent Results (from the past 2160 hour(s))  Flow cytometry panel-leukemia/lymphoma work-up     Status: None   Collection Time: 06/21/21  2:09 PM  Result Value Ref Range   PATH INTERP XXX-IMP Comment     Comment: No significant immunophenotypic abnormality detected   CLINICAL INFO Comment     Comment: (NOTE) Accompanying CBC dated 06/21/2021 shows: WBC count 9.4.    Specimen Type Comment     Comment: Peripheral blood   ASSESSMENT OF LEUKOCYTES Comment     Comment: (NOTE) No monoclonal B cell population is detected. kappa:lambda ratio 1.2 An absolute increase in T-cells is detected. There is no loss of, or aberrant expression of, the pan T cell antigens to suggest a neoplastic T cell process. CD4:CD8 ratio 2.2 No circulating blasts are detected. There is no immunophenotypic  evidence of abnormal myeloid maturation. Rare monocytes show aberrant expression of CD56, a finding that can be seen in association with both reactive/activated processes as well as neoplastic processes. Analysis of the leukocyte population shows: granulocytes 59%, monocytes 4%, lymphocytes 37%, blasts <0.1%, B cells 7%, T cells 26%, NK cells 4%.    % Viable Cells Comment     Comment: 93%   ANALYSIS AND GATING STRATEGY Comment     Comment: 8 color analysis with CD45/SSC gating   IMMUNOPHENOTYPING STUDY Comment     Comment: (NOTE) CD2       Normal         CD3       Normal CD4       Normal         CD5       Normal CD7       Normal         CD8       Normal CD10      Normal         CD11b     Normal CD13      Normal         CD14      Normal CD16      Normal          CD19      Normal CD20      Normal         CD33      Normal CD34      Normal         CD38      Normal CD45      Normal         CD56      See Text CD57      Normal         CD117     Normal HLA-DR    Normal         KAPPA     Normal LAMBDA    Normal         CD64      Normal    PATHOLOGIST NAME Comment     Comment: Henrietta Hoover, M.D.   COMMENT: Comment     Comment: (NOTE) Each antibody in this assay was utilized to assess for potential abnormalities of studied cell populations or to characterize identified abnormalities. This test was developed and its  performance characteristics determined by [facility].  It has not been cleared or approved by the U.S. Food and Drug Administration. The FDA has determined that such clearance or approval is not necessary. This test is used for clinical purposes.  It should not be regarded as investigational or for research. Performed At: [facility] [address] [place], [place] [id number] Katina Degree MDPhD [id number] Performed At: [facility] [address] [place], [place] [id number] Katina Degree MDPhD [id number]   JAK2 genotypr (mutation detection, qual)     Status: None   Collection Time: 06/21/21  2:09 PM  Result Value Ref Range   JAK2 GenotypR Comment     Comment: (NOTE) Result: NEGATIVE for the JAK2 V617F mutation. Interpretation:  The G to T nucleotide change encoding the V617F mutation was not detected.  This result does not rule out the presence of the JAK2 mutation at a level below the sensitivity of detection of this assay, or the presence of other mutations within JAK2 not detected by this assay.  This result does not rule out a diagnosis of polycythemia vera, essential thrombocythemia or idiopathic myelofibrosis as the V617F mutation is not detected in all patients with these disorders.    Director Review, JAK2 Comment     Comment: (NOTE) Constance Goltz, PhD, [facility]               Director, Molecular [facility] for [place] and [place], [place]               [phone] This test was developed and its performance characteristics determined by [facility]. It has not been cleared or approved by the Food and Drug Administration. Performed At: [facility] [address] [place], [place] [id number] Katina Degree MDPhD [id number] Performed At: [facility] [address] [place], [place] [id number] Katina Degree MDPhD [id number]    BACKGROUND: Comment     Comment: (NOTE) JAK2 is a cytoplasmic tyrosine kinase with a key role in signal transduction from multiple hematopoietic growth factor receptors. A point mutation within exon 14 of the JAK2 gene ([id number]) encoding a valine to phenylalanine substitution at position 617 of the JAK2 protein (V617F) has been identified in most patients with polycythemia vera, and in about half of those with either essential thrombocythemia or idiopathic myelofibrosis. The V617F has also been detected, although infrequently, in other myeloid disorders such as chronic myelomonocytic leukemia and chronic neutrophilic luekemia. V617F is an acquired mutation that alters a highly conserved valine present in the negative regulatory JH2 domain of the JAK2 protein and is predicted to dysregulate kinase activity. Methodology: Total genomic DNA was extracted and subjected to TaqMan real-time PCR amplification/detection. Two amplification products per sample were monitored by real-time PCR using primers/probes s pecific to JAK2 wild type (WT) and JAK2 mutant V617F. The ABI7900 Absolute Quantitation software will compare the patient specimen valuse to the standard curves and generate percent values for wild type and mutant type. In vitro studies have indicated that this assay has an analytical sensitivity of 1%. References: Baxter EJ, Scott Phineas Real, et al. Acquired mutation of  the tyrosine kinase JAK2 in human myeloproliferative disorders. Lancet. 2005 Mar 19-25; 365(9464):1054-1061. Jeneen Rinks  Berle Mull Couedic JP. A unique clonal JAK2 mutation leading to constitutive signaling causes polycythaemia vera. Nature. 2005 Apr 28; 434(7037):1144-1148. Kralovics R, Passamonti F, Buser AS, et al. A gain-of-function mutation of JAK2 in myeloproliferative disorders. N Engl J Med. 2005 Apr 28; 352(17):1779-1790.   Erythropoietin     Status: Abnormal   Collection Time: 06/21/21  2:09 PM  Result Value Ref Range   Erythropoietin 31.3 (H) 2.6 - 18.5 mIU/mL    Comment: (NOTE) [employer] UniCel DxI 800 Immunoassay System Values obtained with different assay methods or kits cannot be used interchangeably. Results cannot be interpreted as absolute evidence of the presence or absence of malignant disease. Performed At: [facility] [place], [place] [id number] Rush Farmer MD [id number]   Iron and TIBC     Status: None   Collection Time: 06/21/21  2:09 PM  Result Value Ref Range   Iron 110 28 - 170 ug/dL   TIBC 377 250 - 450 ug/dL   Saturation Ratios 29 10.4 - 31.8 %   UIBC 267 ug/dL    Comment: Performed at [facility], [address] [place], [place] [address]  Ferritin     Status: None   Collection Time: 06/21/21  2:09 PM  Result Value Ref Range   Ferritin 184 11 - 307 ng/mL    Comment: Performed at [facility], [address] [place], [place] [address]  CBC     Status: Abnormal   Collection Time: 06/21/21  2:09 PM  Result Value Ref Range   WBC 9.4 4.0 - 10.5 K/uL   RBC 5.42 (H) 3.87 - 5.11 MIL/uL   Hemoglobin 15.4 (H) 12.0 - 15.0 g/dL   HCT 46.7 (H) 36.0 - 46.0 %   MCV 86.2 80.0 - 100.0 fL   MCH 28.4 26.0 - 34.0 pg   MCHC 33.0 30.0 - 36.0 g/dL   RDW 13.3 11.5 - 15.5 %   Platelets 164 150 - 400 K/uL   nRBC 0.0 0.0 - 0.2 %    Comment: Performed at [facility], [address], [place], [place]  [address]  Carbon monoxide, blood (performed at ref lab)     Status: Abnormal   Collection Time: 06/21/21  2:09 PM  Result Value Ref Range   Carbon Monoxide, Blood 9.6 (H) 0.0 - 3.6 %    Comment: (NOTE)                            Environmental Exposure:                             Nonsmokers           <3.7                             Smokers              <9.9                            Occupational Exposure:                             BEI                   3.5  Detection Limit =  0.2 Performed At: [facility] [place], [place] [id number] Rush Farmer MD [id number]   ToxASSURE Select 14 (MW), Urine     Status: None   Collection Time: 06/26/21  2:28 PM  Result Value Ref Range   Summary Note     Comment: ==================================================================== ToxASSURE Select 13 (MW) ==================================================================== Test                             Result       Flag       Units  Drug Present not Declared for Prescription Verification   Alcohol, Ethyl                 0.066        UNEXPECTED g/dL    Sources of ethyl alcohol include alcoholic beverages or as a    fermentation product of glucose; glucose is present in this specimen.    Interpret result with caution, as the presence of ethyl alcohol is    likely due, at least in part, to fermentation of glucose.  Drug Absent but Declared for Prescription Verification   Hydrocodone                    Not Detected UNEXPECTED ng/mg creat ==================================================================== Test                      Result    Flag   Units      Ref Range   Creatinine              34               mg/dL      >=20 ===================================================== =============== Declared Medications:  The flagging and interpretation on this report are based on the  following declared medications.  Unexpected results may  arise from  inaccuracies in the declared medications.   **Note: The testing scope of this panel includes these medications:   Hydrocodone (Norco)   **Note: The testing scope of this panel does not include the  following reported medications:   Acetaminophen (Norco)  Albuterol (Ventolin HFA)  Aripiprazole (Abilify)  Aspirin  Budesonide (Symbicort)  Cetirizine (Zyrtec)  Famotidine (Pepcid)  Fluticasone (Flonase)  Formoterol (Symbicort)  Formoterol (Dulera)  Insulin (NovoLog)  Iron  Linaclotide (Linzess)  Losartan (Cozaar)  Meclizine (Antivert)  Metformin (Glucophage)  Mirtazapine (Remeron)  Mometasone (Dulera)  Montelukast (Singulair)  Multivitamin  Ondansetron (Zofran)  Pregabalin (Lyrica)  Propranolol (Inderal)  Rizatriptan (Maxalt)  Rosuvastatin (Crestor)  Solifenacin (Vesicare)  Topiramat e (Topamax)  Trazodone (Desyrel)  Venlafaxine (Effexor)  Vitamin D2 ==================================================================== For clinical consultation, please call [phone]. ====================================================================   Comprehensive metabolic panel     Status: Abnormal   Collection Time: 07/11/21  9:37 AM  Result Value Ref Range   Sodium 137 135 - 145 mmol/L   Potassium 4.0 3.5 - 5.1 mmol/L   Chloride 103 98 - 111 mmol/L   CO2 23 22 - 32 mmol/L   Glucose, Bld 323 (H) 70 - 99 mg/dL    Comment: Glucose reference range applies only to samples taken after fasting for at least 8 hours.   BUN 9 6 - 20 mg/dL   Creatinine, Ser 0.81 0.44 - 1.00 mg/dL   Calcium 9.6 8.9 - 10.3 mg/dL   Total Protein 7.6 6.5 - 8.1 g/dL   Albumin 3.9 3.5 - 5.0 g/dL  AST 14 (L) 15 - 41 U/L   ALT 30 0 - 44 U/L   Alkaline Phosphatase 117 38 - 126 U/L   Total Bilirubin 1.1 0.3 - 1.2 mg/dL   GFR, Estimated >60 >60 mL/min    Comment: (NOTE) Calculated using the CKD-EPI Creatinine Equation (2021)    Anion gap 11 5 - 15    Comment: Performed at [facility], [address] [place], [place] [address]  CBC     Status: Abnormal   Collection Time: 07/11/21  9:37 AM  Result Value Ref Range   WBC 7.5 4.0 - 10.5 K/uL   RBC 5.68 (H) 3.87 - 5.11 MIL/uL   Hemoglobin 15.8 (H) 12.0 - 15.0 g/dL   HCT 51.2 (H) 36.0 - 46.0 %   MCV 90.1 80.0 - 100.0 fL   MCH 27.8 26.0 - 34.0 pg   MCHC 30.9 30.0 - 36.0 g/dL   RDW 14.2 11.5 - 15.5 %   Platelets 258 150 - 400 K/uL   nRBC 0.0 0.0 - 0.2 %    Comment: Performed at [facility], [address] [place], [place] [address]  Type and screen [place]     Status: None   Collection Time: 07/11/21  9:37 AM  Result Value Ref Range   ABO/RH(D) O POS    Antibody Screen NEG    Sample Expiration      07/14/2021,2359 Performed at [facility], [address] [place], [place] [address]   Lipase, blood     Status: None   Collection Time: 07/11/21  9:37 AM  Result Value Ref Range   Lipase 36 11 - 51 U/L    Comment: Performed at [facility], [address] [place], [place] [address]  Resp Panel by RT-PCR (Flu A&B, Covid) Nasopharyngeal Swab     Status: None   Collection Time: 07/11/21  9:37 AM   Specimen: Nasopharyngeal Swab; Nasopharyngeal(NP) swabs in vial transport medium  Result Value Ref Range   SARS Coronavirus 2 by RT PCR NEGATIVE NEGATIVE    Comment: (NOTE) SARS-CoV-2 target nucleic acids are NOT DETECTED.  The SARS-CoV-2 RNA is generally detectable in upper respiratory specimens during the acute phase of infection. The lowest concentration of SARS-CoV-2 viral copies this assay can detect is 138 copies/mL. A negative result does not preclude SARS-Cov-2 infection and should not be used as the sole basis for treatment or other patient management decisions. A negative result may occur with  improper specimen collection/handling, submission of specimen other than nasopharyngeal swab, presence of viral mutation(s) within the areas targeted  by this assay, and inadequate number of viral copies(<138 copies/mL). A negative result must be combined with clinical observations, patient history, and epidemiological information. The expected result is Negative.  Fact Sheet for Patients:  EntrepreneurPulse.com.au  Fact Sheet for Healthcare Providers:  IncredibleEmployment.be  This test is no t yet approved or cleared by the [place] FDA and  has been authorized for detection and/or diagnosis of SARS-CoV-2 by FDA under an Emergency Use Authorization (EUA). This EUA will remain  in effect (meaning this test can be used) for the duration of the COVID-19 declaration under Section 564(b)(1) of the Act, 21 U.S.C.section 360bbb-3(b)(1), unless the authorization is terminated  or revoked sooner.       Influenza A by PCR NEGATIVE NEGATIVE   Influenza B by PCR NEGATIVE NEGATIVE    Comment: (NOTE) The Xpert Xpress SARS-CoV-2/FLU/RSV plus assay is intended as an  aid in the diagnosis of influenza from Nasopharyngeal swab specimens and should not be used as a sole basis for treatment. Nasal washings and aspirates are unacceptable for Xpert Xpress SARS-CoV-2/FLU/RSV testing.  Fact Sheet for Patients: EntrepreneurPulse.com.au  Fact Sheet for Healthcare Providers: IncredibleEmployment.be  This test is not yet approved or cleared by the [place] FDA and has been authorized for detection and/or diagnosis of SARS-CoV-2 by FDA under an Emergency Use Authorization (EUA). This EUA will remain in effect (meaning this test can be used) for the duration of the COVID-19 declaration under Section 564(b)(1) of the Act, 21 U.S.C. section 360bbb-3(b)(1), unless the authorization is terminated or revoked.  Performed at [facility], [place], [place] [address]   Troponin I (High Sensitivity)     Status: None   Collection Time: 07/11/21  9:37  AM  Result Value Ref Range   Troponin I (High Sensitivity) 7 <18 ng/L    Comment: (NOTE) Elevated high sensitivity troponin I (hsTnI) values and significant  changes across serial measurements may suggest ACS but many other  chronic and acute conditions are known to elevate hsTnI results.  Refer to the "Links" section for chest pain algorithms and additional  guidance. Performed at [facility], [address] [place], [place] [address]   Brain natriuretic peptide     Status: None   Collection Time: 07/11/21  9:37 AM  Result Value Ref Range   B Natriuretic Peptide 11.6 0.0 - 100.0 pg/mL    Comment: Performed at [facility], [address] [place], [place] [address]  Protime-INR     Status: None   Collection Time: 07/11/21  9:37 AM  Result Value Ref Range   Prothrombin Time 12.6 11.4 - 15.2 seconds   INR 1.0 0.8 - 1.2    Comment: (NOTE) INR goal varies based on device and disease states. Performed at [facility], [place], [place] [address]   Troponin I (High Sensitivity)     Status: None   Collection Time: 07/11/21 12:07 PM  Result Value Ref Range   Troponin I (High Sensitivity) 6 <18 ng/L    Comment: (NOTE) Elevated high sensitivity troponin I (hsTnI) values and significant  changes across serial measurements may suggest ACS but many other  chronic and acute conditions are known to elevate hsTnI results.  Refer to the "Links" section for chest pain algorithms and additional  guidance. Performed at [facility], [address] [place], [place] [address]   APTT     Status: None   Collection Time: 07/11/21  4:18 PM  Result Value Ref Range   aPTT 27 24 - 36 seconds    Comment: Performed at [facility], [address] [place], [place] [address]  Glucose, capillary     Status: Abnormal   Collection Time: 07/11/21  4:51 PM  Result Value Ref Range   Glucose-Capillary 299 (H) 70 - 99 mg/dL    Comment:  Glucose reference range applies only to samples taken after fasting for at least 8 hours.  Glucose, capillary     Status: Abnormal   Collection Time: 07/11/21  8:48 PM  Result Value Ref Range   Glucose-Capillary 444 (H) 70 - 99 mg/dL    Comment: Glucose reference range applies only to samples taken after fasting for at least 8 hours.  Glucose, capillary     Status: Abnormal   Collection Time: 07/11/21  8:51 PM  Result Value Ref Range  Glucose-Capillary 429 (H) 70 - 99 mg/dL    Comment: Glucose reference range applies only to samples taken after fasting for at least 8 hours.  Glucose, random     Status: Abnormal   Collection Time: 07/11/21  9:38 PM  Result Value Ref Range   Glucose, Bld 454 (H) 70 - 99 mg/dL    Comment: Glucose reference range applies only to samples taken after fasting for at least 8 hours. Performed at [facility], [address] [place], [place] [address]   CBC     Status: None   Collection Time: 07/12/21  3:56 AM  Result Value Ref Range   WBC 9.0 4.0 - 10.5 K/uL   RBC 4.83 3.87 - 5.11 MIL/uL   Hemoglobin 13.4 12.0 - 15.0 g/dL   HCT 42.6 36.0 - 46.0 %   MCV 88.2 80.0 - 100.0 fL   MCH 27.7 26.0 - 34.0 pg   MCHC 31.5 30.0 - 36.0 g/dL   RDW 14.4 11.5 - 15.5 %   Platelets 279 150 - 400 K/uL   nRBC 0.0 0.0 - 0.2 %    Comment: Performed at [facility], [address] [place], [place] [address]  Glucose, capillary     Status: Abnormal   Collection Time: 07/12/21  7:46 AM  Result Value Ref Range   Glucose-Capillary 296 (H) 70 - 99 mg/dL    Comment: Glucose reference range applies only to samples taken after fasting for at least 8 hours.  ECHOCARDIOGRAM COMPLETE     Status: None   Collection Time: 07/12/21  8:45 AM  Result Value Ref Range   Weight 1,920.01 oz   Height 59 in   BP 118/82 mmHg   Ao pk vel 0.96 m/s   AV Area VTI 2.29 cm2   AR max vel 2.73 cm2   AV Mean grad 2.0 mmHg   AV Peak grad 3.7 mmHg   S' Lateral 2.01 cm   AV Area  mean vel 2.41 cm2   Area-P 1/2 3.48 cm2   MV VTI 1.90 cm2  Glucose, capillary     Status: Abnormal   Collection Time: 07/12/21 11:37 AM  Result Value Ref Range   Glucose-Capillary 290 (H) 70 - 99 mg/dL    Comment: Glucose reference range applies only to samples taken after fasting for at least 8 hours.  Glucose, capillary     Status: Abnormal   Collection Time: 07/12/21  4:21 PM  Result Value Ref Range   Glucose-Capillary 138 (H) 70 - 99 mg/dL    Comment: Glucose reference range applies only to samples taken after fasting for at least 8 hours.    Radiology: CT CHEST WO CONTRAST  Result Date: 08/10/2021 CLINICAL DATA:  Shortness of breath EXAM: CT CHEST WITHOUT CONTRAST TECHNIQUE: Multidetector CT imaging of the chest was performed following the standard protocol without IV contrast. RADIATION DOSE REDUCTION: This exam was performed according to the departmental dose-optimization program which includes automated exposure control, adjustment of the mA and/or kV according to patient size and/or use of iterative reconstruction technique. COMPARISON:  07/11/2021 FINDINGS: Cardiovascular: Aortic atherosclerosis. Cardiomegaly. Left coronary artery calcifications. Trace pericardial effusion. Enlargement of the main pulmonary artery, measuring up to 3.6 cm in caliber. Mediastinum/Nodes: No enlarged mediastinal, hilar, or axillary lymph nodes. Thyroid gland, trachea, and esophagus demonstrate no significant findings. Lungs/Pleura: Mild, bandlike scarring of the bilateral lung bases. Minimal centrilobular and paraseptal emphysema. Background of fine centrilobular pulmonary nodules, most concentrated in the apices. No  pleural effusion or pneumothorax. Upper Abdomen: No acute abnormality. Musculoskeletal: No chest wall abnormality. No suspicious osseous lesions identified. IMPRESSION: 1. Minimal emphysema.  No acute airspace disease. 2. Background of fine centrilobular pulmonary nodules, most concentrated in  the apices, consistent with smoking-related respiratory bronchiolitis. 3. Cardiomegaly and coronary artery disease. 4. Enlargement of the main pulmonary artery, as can be seen in pulmonary hypertension. Aortic Atherosclerosis (ICD10-I70.0) and Emphysema (ICD10-J43.9). Electronically Signed   By: Delanna Ahmadi M.D.   On: 08/10/2021 16:13    No results found.  CT CHEST WO CONTRAST  Result Date: 08/10/2021 CLINICAL DATA:  Shortness of breath EXAM: CT CHEST WITHOUT CONTRAST TECHNIQUE: Multidetector CT imaging of the chest was performed following the standard protocol without IV contrast. RADIATION DOSE REDUCTION: This exam was performed according to the departmental dose-optimization program which includes automated exposure control, adjustment of the mA and/or kV according to patient size and/or use of iterative reconstruction technique. COMPARISON:  07/11/2021 FINDINGS: Cardiovascular: Aortic atherosclerosis. Cardiomegaly. Left coronary artery calcifications. Trace pericardial effusion. Enlargement of the main pulmonary artery, measuring up to 3.6 cm in caliber. Mediastinum/Nodes: No enlarged mediastinal, hilar, or axillary lymph nodes. Thyroid gland, trachea, and esophagus demonstrate no significant findings. Lungs/Pleura: Mild, bandlike scarring of the bilateral lung bases. Minimal centrilobular and paraseptal emphysema. Background of fine centrilobular pulmonary nodules, most concentrated in the apices. No pleural effusion or pneumothorax. Upper Abdomen: No acute abnormality. Musculoskeletal: No chest wall abnormality. No suspicious osseous lesions identified. IMPRESSION: 1. Minimal emphysema.  No acute airspace disease. 2. Background of fine centrilobular pulmonary nodules, most concentrated in the apices, consistent with smoking-related respiratory bronchiolitis. 3. Cardiomegaly and coronary artery disease. 4. Enlargement of the main pulmonary artery, as can be seen in pulmonary hypertension. Aortic  Atherosclerosis (ICD10-I70.0) and Emphysema (ICD10-J43.9). Electronically Signed   By: Delanna Ahmadi M.D.   On: 08/10/2021 16:13      Assessment and Plan: Patient Active Problem List   Diagnosis Date Noted   Acute respiratory failure with hypoxia ([place]) 07/11/2021   Abdominal pain 07/11/2021   Depression with anxiety 07/11/2021   Rectal bleeding 07/11/2021   HLD (hyperlipidemia) 07/11/2021   GERD (gastroesophageal reflux disease) 07/11/2021   Asthma 07/11/2021   Leukocytosis 06/21/2021   Polycythemia 06/21/2021   Opiate overdose, accidental or unintentional, sequela 04/05/2021   Opioid overdose ([place]) 04/05/2021   Arthritis of left knee 07/05/2020   B12 deficiency 05/30/2020   OSA on CPAP 05/15/2020   CPAP use counseling 05/15/2020   Primary osteoarthritis of right knee 04/26/2020   Anxiety 08/29/2019   OSA (obstructive sleep apnea) 09/05/2018   Pain in both hands 03/11/2018   Calculus of gallbladder without cholecystitis without obstruction 03/04/2018   Chronic constipation 03/04/2018   Pelvic pain in female 03/04/2018   Chronic constipation 03/04/2018   Pharmacologic therapy 11/27/2017   Disorder of skeletal system 11/27/2017   Problems influencing health status 11/27/2017   Aortic atherosclerosis ([place]) 11/26/2017   Smoker 09/01/2017   Lumbar radiculopathy 05/29/2017   Chronic bilateral low back pain with bilateral sciatica 05/29/2017   Chronic pain syndrome 05/29/2017   Chronic, continuous use of opioids 05/29/2017   Chronic myofascial pain 05/29/2017   Primary osteoarthritis of both hips 03/11/2017   Fatty liver disease, nonalcoholic 81/82/9937   DDD (degenerative disc disease), lumbar 10/23/2016   Diverticulitis 10/14/2016   Cardiac murmur 08/28/2016   Hyperlipidemia, mixed 05/29/2016   Type 2 diabetes mellitus without complications ([place]) 16/96/7893   Essential hypertension 05/15/2015    1. OSA  on CPAP The patient does not  tolerate PAP and reports no benefit  from PAP use. She has repeatedly tried to use pap and not been successful. She is interested in [facility] and is a candidate based on having moderate apnea and cpap failure. Her BMI is within normal range. She needs a PSG to ensure she still qualifies.   OSA- untreated due to intolerance with pap. PSG to determine eligibility for [facility]. Encouraged to stop smoking and use her oxygen as prescribed.    2. CPAP use counseling CPAP Counseling: had a lengthy discussion with the patient regarding the importance of PAP therapy in management of the sleep apnea. Patient appears to understand the risk factor reduction and also understands the risks associated with untreated sleep apnea. Patient will try to make a good faith effort to remain compliant with therapy. Also instructed the patient on proper cleaning of the device including the water must be changed daily if possible and use of distilled water is preferred. Patient understands that the machine should be regularly cleaned with appropriate recommended cleaning solutions that do not damage the PAP machine for example given white vinegar and water rinses. Other methods such as ozone treatment may not be as good as these simple methods to achieve cleaning.   3. Essential hypertension Hypertension Counseling:   The following hypertensive lifestyle modification were recommended and discussed:  1. Limiting alcohol intake to less than 1 oz/day of ethanol:(24 oz of beer or 8 oz of wine or 2 oz of 100-proof whiskey). 2. Take baby ASA 81 mg daily. 3. Importance of regular aerobic exercise and losing weight. 4. Reduce dietary saturated fat and cholesterol intake for overall cardiovascular health. 5. Maintaining adequate dietary potassium, calcium, and magnesium intake. 6. Regular monitoring of the blood pressure. 7. Reduce sodium intake to less than 100 mmol/day (less than 2.3 gm of sodium or less than 6 gm of sodium choride)     General Counseling: I have  discussed the findings of the evaluation and examination with Thayer Headings.  I have also discussed any further diagnostic evaluation thatmay be needed or ordered today. Shambhavi verbalizes understanding of the findings of todays visit. We also reviewed her medications today and discussed drug interactions and side effects including but not limited excessive drowsiness and altered mental states. We also discussed that there is always a risk not just to her but also people around her. she has been encouraged to call the office with any questions or concerns that should arise related to todays visit.  No orders of the defined types were placed in this encounter.       I have personally obtained a history, examined the patient, evaluated laboratory and imaging results, formulated the assessment and plan and placed orders. This patient was seen today by Tressie Ellis, PA-C in collaboration with Dr. Devona Konig.   Allyne Gee, MD [facility] Diplomate ABMS Pulmonary Critical Care Medicine and Sleep Medicine

## 2021-08-27 NOTE — Telephone Encounter (Signed)
Meds sent per Dr Holley Raring and patient notified.

## 2021-09-18 ENCOUNTER — Ambulatory Visit
Payer: Medicaid Other | Attending: Student in an Organized Health Care Education/Training Program | Admitting: Student in an Organized Health Care Education/Training Program

## 2021-09-18 ENCOUNTER — Encounter: Payer: Self-pay | Admitting: Student in an Organized Health Care Education/Training Program

## 2021-09-18 VITALS — BP 151/67 | HR 111 | Temp 98.0°F | Resp 18 | Ht 59.0 in | Wt 116.0 lb

## 2021-09-18 DIAGNOSIS — M1712 Unilateral primary osteoarthritis, left knee: Secondary | ICD-10-CM | POA: Insufficient documentation

## 2021-09-18 DIAGNOSIS — M1711 Unilateral primary osteoarthritis, right knee: Secondary | ICD-10-CM | POA: Diagnosis present

## 2021-09-18 DIAGNOSIS — M797 Fibromyalgia: Secondary | ICD-10-CM | POA: Insufficient documentation

## 2021-09-18 DIAGNOSIS — M5416 Radiculopathy, lumbar region: Secondary | ICD-10-CM | POA: Diagnosis present

## 2021-09-18 DIAGNOSIS — G8929 Other chronic pain: Secondary | ICD-10-CM | POA: Diagnosis present

## 2021-09-18 DIAGNOSIS — G894 Chronic pain syndrome: Secondary | ICD-10-CM | POA: Diagnosis present

## 2021-09-18 DIAGNOSIS — M7918 Myalgia, other site: Secondary | ICD-10-CM | POA: Diagnosis present

## 2021-09-18 DIAGNOSIS — Z79899 Other long term (current) drug therapy: Secondary | ICD-10-CM | POA: Insufficient documentation

## 2021-09-18 DIAGNOSIS — F1190 Opioid use, unspecified, uncomplicated: Secondary | ICD-10-CM | POA: Insufficient documentation

## 2021-09-18 MED ORDER — PREGABALIN 100 MG PO CAPS: 100.0000 mg | ORAL_CAPSULE | Freq: Two times a day (BID) | ORAL | 5 refills | Status: DC

## 2021-09-18 MED ORDER — HYDROCODONE-ACETAMINOPHEN 7.5-325 MG PO TABS: 1.0000 | ORAL_TABLET | Freq: Three times a day (TID) | ORAL | 0 refills | Status: AC | PRN

## 2021-09-18 MED ORDER — HYDROCODONE-ACETAMINOPHEN 7.5-325 MG PO TABS: 1.0000 | ORAL_TABLET | Freq: Three times a day (TID) | ORAL | 0 refills | Status: DC | PRN

## 2021-09-18 NOTE — Progress Notes (Signed)
PROVIDER NOTE: Information contained herein reflects review and annotations entered in association with encounter. Interpretation of such information and data should be left to medically-trained personnel. Information provided to patient can be located elsewhere in the medical record under "Patient Instructions". Document created using STT-dictation technology, any transcriptional errors that may result from process are unintentional.    Patient: [name]  Service Category: E/M  Provider: Edward Jolly, MD  DOB: 08-14-1960  DOS: 09/18/2021  Specialty: Interventional Pain Management  MRN: [id number]  Setting: Ambulatory outpatient  PCP: Dan Humphreys, MD  Type: Established Patient    Referring Provider: Dan Humphreys, MD  Location: Office  Delivery: Face-to-face     HPI  [name], a 61 y.o. year old female, is here today because of her Primary osteoarthritis of right knee [M17.11]. [name]'s primary complain today is Back Pain (low)  Last encounter: My last encounter with her was on 06/26/21  Pertinent problems: [name] has Lumbar radiculopathy; Chronic bilateral low back pain with bilateral sciatica; Chronic pain syndrome; Chronic, continuous use of opioids; Chronic myofascial pain; Primary osteoarthritis of both hips; Type 2 diabetes mellitus without complications (HCC); and Pain in both hands on their pertinent problem list. Pain Assessment: Severity of Chronic pain is reported as a 8 /10. Location: Back Lower/sometimes radiates down legs. Onset: More than a month ago. Quality: Aching. Timing: Constant. Modifying factor(s): medicine. Vitals:  height is 4\' 11"  (1.499 m) and weight is 116 lb (52.6 kg). Her temperature is 98 F (36.7 C). Her blood pressure is 151/67 (abnormal) and her pulse is 111 (abnormal). Her respiration is 18 and oxygen saturation is 94%.   Reason for encounter:   No change in medical history since last visit.  Patient's pain is at  baseline.  Patient continues multimodal pain regimen as prescribed.  States that it provides pain relief and improvement in functional status.  Pharmacotherapy Assessment  Analgesic: Norco 7.5 mg TID PRN #90/month   Monitoring: [place] PMP: PDMP reviewed during this encounter.       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 Effectiveness: Clinically acceptable.  Valerie Salts, RN  09/18/2021 10:19 AM  Sign when Signing Visit Nursing Pain Medication Assessment:  Safety precautions to be maintained throughout the outpatient stay will include: orient to surroundings, keep bed in low position, maintain call bell within reach at all times, provide assistance with transfer out of bed and ambulation.  Medication Inspection Compliance: Pill count conducted under aseptic conditions, in front of the patient. Neither the pills nor the bottle was removed from the patient's sight at any time. Once count was completed pills were immediately returned to the patient in their original bottle.  Medication: Hydrocodone/APAP Pill/Patch Count:  29 of 90 pills remain Pill/Patch Appearance: Markings consistent with prescribed medication Bottle Appearance: Standard pharmacy container. Clearly labeled. Filled Date: 06 / 05 / 2023 Last Medication intake:  Today   UDS:  Summary  Date Value Ref Range Status  06/26/2021 Note  Final    Comment:    ==================================================================== [employer] Select 13 (MW) ==================================================================== Test                             Result       Flag       Units  Drug Present not Declared for Prescription Verification   Alcohol, Ethyl  0.066        UNEXPECTED g/dL    Sources of ethyl alcohol include alcoholic beverages or as a    fermentation product of glucose; glucose is present in this specimen.    Interpret result with caution,  as the presence of ethyl alcohol is    likely due, at least in part, to fermentation of glucose.  Drug Absent but Declared for Prescription Verification   Hydrocodone                    Not Detected UNEXPECTED ng/mg creat ==================================================================== Test                      Result    Flag   Units      Ref Range   Creatinine              34               mg/dL      >=78 ==================================================================== Declared Medications:  The flagging and interpretation on this report are based on the  following declared medications.  Unexpected results may arise from  inaccuracies in the declared medications.   **Note: The testing scope of this panel includes these medications:   Hydrocodone (Norco)   **Note: The testing scope of this panel does not include the  following reported medications:   Acetaminophen (Norco)  Albuterol (Ventolin HFA)  Aripiprazole (Abilify)  Aspirin  Budesonide (Symbicort)  Cetirizine (Zyrtec)  Famotidine (Pepcid)  Fluticasone (Flonase)  Formoterol (Symbicort)  Formoterol (Dulera)  Insulin (NovoLog)  Iron  Linaclotide (Linzess)  Losartan (Cozaar)  Meclizine (Antivert)  Metformin (Glucophage)  Mirtazapine (Remeron)  Mometasone (Dulera)  Montelukast (Singulair)  Multivitamin  Ondansetron (Zofran)  Pregabalin (Lyrica)  Propranolol (Inderal)  Rizatriptan (Maxalt)  Rosuvastatin (Crestor)  Solifenacin (Vesicare)  Topiramate (Topamax)  Trazodone (Desyrel)  Venlafaxine (Effexor)  Vitamin D2 ==================================================================== For clinical consultation, please call [phone]. ====================================================================      ROS  Constitutional: Denies any fever or chills Gastrointestinal: No reported hemesis, hematochezia, vomiting, or acute GI distress Musculoskeletal:  +LBP Neurological: No reported episodes of  acute onset apraxia, aphasia, dysarthria, agnosia, amnesia, paralysis, loss of coordination, or loss of consciousness  Medication Review  ARIPiprazole, Alive Once Daily Womens, HYDROcodone-acetaminophen, Insulin Pen Needle, albuterol, aspirin, budesonide-formoterol, cetirizine, ergocalciferol, famotidine, fluticasone, glucose blood, insulin aspart protamine - aspart, lidocaine, linaclotide, losartan, meclizine, metFORMIN, mirtazapine, montelukast, nicotine, ondansetron, pregabalin, propranolol, rizatriptan, rosuvastatin, solifenacin, tiotropium, topiramate, traZODone, and venlafaxine XR  History Review  Allergy: [name] is allergic to tramadol, benadryl [diphenhydramine hcl], bextra [valdecoxib], invokana [canagliflozin], and sulfamethoxazole-trimethoprim. Drug: [name]  reports that she does not currently use drugs. Alcohol:  reports current alcohol use of about 2.0 standard drinks of alcohol per week. Tobacco:  reports that she has been smoking cigarettes. She has a 7.00 pack-year smoking history. She has been exposed to tobacco smoke. She has quit using smokeless tobacco. Social: Ms. Krotz  reports that she has been smoking cigarettes. She has a 7.00 pack-year smoking history. She has been exposed to tobacco smoke. She has quit using smokeless tobacco. She reports current alcohol use of about 2.0 standard drinks of alcohol per week. She reports that she does not currently use drugs. Medical:  has a past medical history of Abnormal CBC, Asthma, Asthma, BPPV (benign paroxysmal positional vertigo), Cardiac murmur, unspecified (08/28/2016), COPD (chronic obstructive pulmonary disease) (HCC), CPAP (continuous positive airway pressure) dependence, Degenerative disc disease, lumbar, Depression, Diabetes  mellitus, Diverticulitis (10/2016), Elevated LFTs, Fatty liver, Fibromyalgia, Fibromyalgia, GERD (gastroesophageal reflux disease), Headache, History of degenerative disc disease,  Hyperlipidemia, Hypertension, and Sleep apnea. Surgical: [name]  has a past surgical history that includes Abdominal surgery; Abdominal hysterectomy; Esophagogastroduodenoscopy (egd) with propofol (N/A, 02/20/2017); Colonoscopy with propofol (N/A, 02/20/2017); Upper esophageal endoscopic ultrasound (eus) (N/A, 04/03/2017); Diverticulitis; EUS (N/A, 01/08/2018); Cholecystectomy (N/A, 04/27/2018); Cataract extraction w/PHACO (Left, 09/07/2019); and Cataract extraction w/PHACO (Right, 09/28/2019). Family: family history includes Diabetes in her mother; Hypertension in her mother.  Laboratory Chemistry Profile   Renal Lab Results  Component Value Date   BUN 9 07/11/2021   CREATININE 0.81 07/11/2021   BCR 14 11/27/2017   GFRAA >60 08/09/2019   GFRNONAA >60 07/11/2021    Hepatic Lab Results  Component Value Date   AST 14 (L) 07/11/2021   ALT 30 07/11/2021   ALBUMIN 3.9 07/11/2021   ALKPHOS 117 07/11/2021   LIPASE 36 07/11/2021   AMMONIA 46 (H) 04/05/2021    Electrolytes Lab Results  Component Value Date   NA 137 07/11/2021   K 4.0 07/11/2021   CL 103 07/11/2021   CALCIUM 9.6 07/11/2021   MG 1.7 04/05/2021    Bone No results found for: "VD25OH", "VD125OH2TOT", "ZO1096EA5", "WU9811BJ4", "25OHVITD1", "25OHVITD2", "25OHVITD3", "TESTOFREE", "TESTOSTERONE"  Inflammation (CRP: Acute Phase) (ESR: Chronic Phase) Lab Results  Component Value Date   CRP <1 11/27/2017   ESRSEDRATE 17 11/27/2017   LATICACIDVEN 1.3 04/05/2021         Note: Above Lab results reviewed.  Recent Imaging Review  CT CHEST WO CONTRAST CLINICAL DATA:  Shortness of breath  EXAM: CT CHEST WITHOUT CONTRAST  TECHNIQUE: Multidetector CT imaging of the chest was performed following the standard protocol without IV contrast.  RADIATION DOSE REDUCTION: This exam was performed according to the departmental dose-optimization program which includes automated exposure control, adjustment of the mA and/or kV  according to patient size and/or use of iterative reconstruction technique.  COMPARISON:  07/11/2021  FINDINGS: Cardiovascular: Aortic atherosclerosis. Cardiomegaly. Left coronary artery calcifications. Trace pericardial effusion. Enlargement of the main pulmonary artery, measuring up to 3.6 cm in caliber.  Mediastinum/Nodes: No enlarged mediastinal, hilar, or axillary lymph nodes. Thyroid gland, trachea, and esophagus demonstrate no significant findings.  Lungs/Pleura: Mild, bandlike scarring of the bilateral lung bases. Minimal centrilobular and paraseptal emphysema. Background of fine centrilobular pulmonary nodules, most concentrated in the apices. No pleural effusion or pneumothorax.  Upper Abdomen: No acute abnormality.  Musculoskeletal: No chest wall abnormality. No suspicious osseous lesions identified.  IMPRESSION: 1. Minimal emphysema.  No acute airspace disease. 2. Background of fine centrilobular pulmonary nodules, most concentrated in the apices, consistent with smoking-related respiratory bronchiolitis. 3. Cardiomegaly and coronary artery disease. 4. Enlargement of the main pulmonary artery, as can be seen in pulmonary hypertension.  Aortic Atherosclerosis (ICD10-I70.0) and Emphysema (ICD10-J43.9).  Electronically Signed   By: Jearld Lesch M.D.   On: 08/10/2021 16:13  Note: Reviewed        Physical Exam  General appearance: Well nourished, well developed, and well hydrated. In no apparent acute distress Mental status: Alert, oriented x 3 (person, place, & time)       Respiratory: No evidence of acute respiratory distress Eyes: PERLA Vitals: BP (!) 151/67   Pulse (!) 111   Temp 98 F (36.7 C)   Resp 18   Ht 4\' 11"  (1.499 m)   Wt 116 lb (52.6 kg)   LMP  (LMP Unknown) Comment: "many years"  SpO2 94%  BMI 23.43 kg/m  BMI: Estimated body mass index is 23.43 kg/m as calculated from the following:   Height as of this encounter: 4\' 11"  (1.499 m).    Weight as of this encounter: 116 lb (52.6 kg). Ideal: Patient must be at least 60 in tall to calculate ideal body weight  +LBP, worse with facet loading  5 out of 5 strength bilateral lower extremity: Plantar flexion, dorsiflexion, knee flexion, knee extension.   Assessment   Status Diagnosis  Controlled Controlled Controlled 1. Primary osteoarthritis of right knee   2. Arthritis of left knee   3. Chronic myofascial pain   4. Chronic, continuous use of opioids   5. Lumbar radiculopathy   6. Fibromyalgia   7. Pharmacologic therapy   8. Chronic pain syndrome      Plan of Care    [name] has a current medication list which includes the following long-term medication(s): albuterol, aripiprazole, budesonide-formoterol, famotidine, insulin aspart protamine - aspart, losartan, metformin, mirtazapine, propranolol, rizatriptan, topiramate, trazodone, venlafaxine xr, and pregabalin.  Pharmacotherapy (Medications Ordered): Meds ordered this encounter  Medications   HYDROcodone-acetaminophen (NORCO) 7.5-325 MG tablet    Sig: Take 1 tablet by mouth 3 (three) times daily as needed for severe pain. Must last 30 days    Dispense:  90 tablet    Refill:  0    Chronic Pain: STOP Act (Not applicable) Fill 1 day early if closed on refill date. Avoid benzodiazepines within 8 hours of opioids   HYDROcodone-acetaminophen (NORCO) 7.5-325 MG tablet    Sig: Take 1 tablet by mouth 3 (three) times daily as needed for severe pain. Must last 30 days    Dispense:  90 tablet    Refill:  0    Chronic Pain: STOP Act (Not applicable) Fill 1 day early if closed on refill date. Avoid benzodiazepines within 8 hours of opioids   HYDROcodone-acetaminophen (NORCO) 7.5-325 MG tablet    Sig: Take 1 tablet by mouth 3 (three) times daily as needed for severe pain. Must last 30 days    Dispense:  90 tablet    Refill:  0    Chronic Pain: STOP Act (Not applicable) Fill 1 day early if closed on refill  date. Avoid benzodiazepines within 8 hours of opioids   pregabalin (LYRICA) 100 MG capsule    Sig: Take 1 capsule (100 mg total) by mouth 2 (two) times daily.    Dispense:  60 capsule    Refill:  5     Follow-up plan:   Return in about 3 months (around 12/19/2021) for Medication Management, in person.     Recent Visits Date Type Provider Dept  06/26/21 Office Visit Edward Jolly, MD [facility]  Showing recent visits within past 90 days and meeting all other requirements Today's Visits Date Type Provider Dept  09/18/21 Office Visit Edward Jolly, MD [facility]  Showing today's visits and meeting all other requirements Future Appointments Date Type Provider Dept  12/13/21 Appointment Edward Jolly, MD [facility]  Showing future appointments within next 90 days and meeting all other requirements  I discussed the assessment and treatment plan with the patient. The patient was provided an opportunity to ask questions and all were answered. The patient agreed with the plan and demonstrated an understanding of the instructions.  Patient advised to call back or seek an in-person evaluation if the symptoms or condition worsens.  Duration of encounter: .  Note by: Edward Jolly, MD Date:  09/18/2021; Time: 10:39 AM

## 2021-09-18 NOTE — Progress Notes (Signed)
Nursing Pain Medication Assessment:  Safety precautions to be maintained throughout the outpatient stay will include: orient to surroundings, keep bed in low position, maintain call bell within reach at all times, provide assistance with transfer out of bed and ambulation.  Medication Inspection Compliance: Pill count conducted under aseptic conditions, in front of the patient. Neither the pills nor the bottle was removed from the patient's sight at any time. Once count was completed pills were immediately returned to the patient in their original bottle.  Medication: Hydrocodone/APAP Pill/Patch Count:  29 of 90 pills remain Pill/Patch Appearance: Markings consistent with prescribed medication Bottle Appearance: Standard pharmacy container. Clearly labeled. Filled Date: 06 / 05 / 2023 Last Medication intake:  Today

## 2021-09-26 ENCOUNTER — Telehealth: Payer: Self-pay | Admitting: Student in an Organized Health Care Education/Training Program

## 2021-09-26 NOTE — Telephone Encounter (Signed)
PA for Hydrocodone submitted. Attempted to notify patient, no answer, voicemail not set up.

## 2021-09-26 NOTE — Telephone Encounter (Signed)
Patient called stating [facility] told her she needs PA on her pain meds so she can pick them up.

## 2021-09-30 ENCOUNTER — Other Ambulatory Visit: Payer: Self-pay

## 2021-09-30 ENCOUNTER — Emergency Department
Admission: EM | Admit: 2021-09-30 | Discharge: 2021-09-30 | Disposition: A | Discharge status: Home / Self Care | Payer: Medicaid Other | Attending: Student in an Organized Health Care Education/Training Program | Admitting: Student in an Organized Health Care Education/Training Program

## 2021-09-30 ENCOUNTER — Encounter: Payer: Self-pay | Admitting: Emergency Medicine

## 2021-09-30 DIAGNOSIS — R040 Epistaxis: Secondary | ICD-10-CM | POA: Diagnosis present

## 2021-09-30 DIAGNOSIS — F17200 Nicotine dependence, unspecified, uncomplicated: Secondary | ICD-10-CM | POA: Diagnosis not present

## 2021-09-30 DIAGNOSIS — J340 Abscess, furuncle and carbuncle of nose: Secondary | ICD-10-CM | POA: Diagnosis not present

## 2021-09-30 NOTE — ED Provider Notes (Signed)
[facility] Provider Note    Event Date/Time   First MD Initiated Contact with Patient 09/30/21 1246     (approximate)   History   Epistaxis   HPI  [name] is a 61 y.o. female history of smoking presents to the ER for evaluation of recurrent nosebleeds particular the left side.  Has had more frequent ones over the past few days.  Did call EMS once but stopped before EMS arrived and she was given Afrin nasal spray which has helped it.  Had a brief episode this morning lasting about 15 to 20 minutes.  Denies any chest pain or shortness of breath no trauma.  Denies any cocaine use.     Physical Exam   Triage Vital Signs: ED Triage Vitals  Enc Vitals Group     BP 09/30/21 1241 115/87     Pulse Rate 09/30/21 1241 84     Resp 09/30/21 1241 18     Temp 09/30/21 1241 97.7 F (36.5 C)     Temp src --      SpO2 09/30/21 1241 100 %     Weight 09/30/21 1239 115 lb 15.4 oz (52.6 kg)     Height 09/30/21 1239 4\' 11"  (1.499 m)     Head Circumference --      Peak Flow --      Pain Score 09/30/21 1239 0     Pain Loc --      Pain Edu? --      Excl. in GC? --     Most recent vital signs: Vitals:   09/30/21 1241  BP: 115/87  Pulse: 84  Resp: 18  Temp: 97.7 F (36.5 C)  SpO2: 100%     Constitutional: Alert  Eyes: Conjunctivae are normal.  Head: Atraumatic. Nose: No congestion/rhinnorhea.  Left anterior nare there is an area of ulceration and what I suspect is exposed septum appears chronic with evidence of fairly recent bleed.  No polyps.  Hemostatic no evidence of posterior bleeding. Mouth/Throat: Mucous membranes are moist.   Neck: Painless ROM.  Cardiovascular:   Good peripheral circulation. Respiratory: Normal respiratory effort.  No retractions.  Gastrointestinal: Soft and nontender.  Musculoskeletal:  no deformity Neurologic:  MAE spontaneously. No gross focal neurologic deficits are appreciated.  Skin:  Skin is warm, dry and  intact. No rash noted. Psychiatric: Mood and affect are normal. Speech and behavior are normal.    ED Results / Procedures / Treatments   Labs (all labs ordered are listed, but only abnormal results are displayed) Labs Reviewed - No data to display   EKG     RADIOLOGY    PROCEDURES:  Critical Care performed:   Procedures   MEDICATIONS ORDERED IN ED: Medications - No data to display   IMPRESSION / MDM / ASSESSMENT AND PLAN / ED COURSE  I reviewed the triage vital signs and the nursing notes.                              Differential diagnosis includes, but is not limited to, epistaxis, mass, polyp, posterior bleed  Patient presents to the ER for evaluation of recurrent epistaxis.  Hemostatic no sign of epistaxis at this time.  Suspect source is from area of ulceration and probable exposed septum but it is not actively bleeding right now.  She is otherwise asymptomatic.  Given this finding I do think that she warrants referral  to ENT for further evaluation.  No indication for further diagnostic testing or treatment here in the ER at this time.  Discussed conservative management and signs symptoms for which she should return.      FINAL CLINICAL IMPRESSION(S) / ED DIAGNOSES   Final diagnoses:  Left-sided epistaxis  Epistaxis     Rx / DC Orders   ED Discharge Orders     None        Note:  This document was prepared using Dragon voice recognition software and may include unintentional dictation errors.    Willy Eddy, MD 09/30/21 1255

## 2021-09-30 NOTE — ED Triage Notes (Signed)
Pt reports since Friday she has been having random nosebleeds. Pt denies nasal congestion. Pt states that she uses flonase. Pt nose not bleeding at this time. Pt reports last bled this am. Pt reports she does not take blood thinners. Pt reports only pain is in her nose. Denies injuries. Pt has afrin as well.

## 2021-12-03 ENCOUNTER — Other Ambulatory Visit
Admission: RE | Admit: 2021-12-03 | Discharge: 2021-12-03 | Disposition: A | Discharge status: Home / Self Care | Payer: Medicaid Other | Source: Ambulatory Visit | Attending: Student | Admitting: Student

## 2021-12-03 DIAGNOSIS — Z01818 Encounter for other preprocedural examination: Secondary | ICD-10-CM | POA: Diagnosis present

## 2021-12-03 LAB — BRAIN NATRIURETIC PEPTIDE: B Natriuretic Peptide: 20.8 pg/mL (ref 0.0–100.0)

## 2021-12-13 ENCOUNTER — Ambulatory Visit
Payer: Medicaid Other | Attending: Student in an Organized Health Care Education/Training Program | Admitting: Student in an Organized Health Care Education/Training Program

## 2021-12-13 ENCOUNTER — Encounter: Payer: Self-pay | Admitting: Student in an Organized Health Care Education/Training Program

## 2021-12-13 VITALS — BP 159/92 | HR 84 | Temp 97.0°F | Resp 16 | Ht 59.0 in | Wt 116.0 lb

## 2021-12-13 DIAGNOSIS — M1712 Unilateral primary osteoarthritis, left knee: Secondary | ICD-10-CM | POA: Diagnosis present

## 2021-12-13 DIAGNOSIS — M7918 Myalgia, other site: Secondary | ICD-10-CM | POA: Diagnosis present

## 2021-12-13 DIAGNOSIS — G894 Chronic pain syndrome: Secondary | ICD-10-CM

## 2021-12-13 DIAGNOSIS — M5416 Radiculopathy, lumbar region: Secondary | ICD-10-CM

## 2021-12-13 DIAGNOSIS — G8929 Other chronic pain: Secondary | ICD-10-CM

## 2021-12-13 DIAGNOSIS — F1190 Opioid use, unspecified, uncomplicated: Secondary | ICD-10-CM

## 2021-12-13 DIAGNOSIS — M1711 Unilateral primary osteoarthritis, right knee: Secondary | ICD-10-CM | POA: Diagnosis present

## 2021-12-13 DIAGNOSIS — I200 Unstable angina: Secondary | ICD-10-CM

## 2021-12-13 MED ORDER — HYDROCODONE-ACETAMINOPHEN 7.5-325 MG PO TABS: 1.0000 | ORAL_TABLET | Freq: Three times a day (TID) | ORAL | 0 refills | Status: AC | PRN

## 2021-12-13 MED ORDER — HYDROCODONE-ACETAMINOPHEN 7.5-325 MG PO TABS: 1.0000 | ORAL_TABLET | Freq: Three times a day (TID) | ORAL | 0 refills | Status: DC | PRN

## 2021-12-13 NOTE — Progress Notes (Signed)
PROVIDER NOTE: Information contained herein reflects review and annotations entered in association with encounter. Interpretation of such information and data should be left to medically-trained personnel. Information provided to patient can be located elsewhere in the medical record under "Patient Instructions". Document created using STT-dictation technology, any transcriptional errors that may result from process are unintentional.    Patient: [name]  Service Category: E/M  Provider: Gillis Santa, MD  DOB: May 25, 1960  DOS: 12/13/2021  Specialty: Interventional Pain Management  MRN: [id number]  Setting: Ambulatory outpatient  PCP: Margarita Rana, MD  Type: Established Patient    Referring Provider: Margarita Rana, MD  Location: Office  Delivery: Face-to-face     HPI  [name], a 61 y.o. year old female, is here today because of her Primary osteoarthritis of right knee [M17.11]. [name]'s primary complain today is Back Pain (low)  Last encounter: My last encounter with her was on 09/14/2020. Pertinent problems: [name] has Lumbar radiculopathy; Chronic bilateral low back pain with bilateral sciatica; Chronic pain syndrome; Chronic, continuous use of opioids; Chronic myofascial pain; Primary osteoarthritis of both hips; Type 2 diabetes mellitus without complications ([place]); and Pain in both hands on their pertinent problem list. Pain Assessment: Severity of   is reported as a 8 /10. Location: Back Lower/radiates down to right knee. Onset: More than a month ago. Quality: Aching. Timing: Constant. Modifying factor(s): meds. Vitals:  height is $RemoveB'4\' 11"'GvSireQX$  (1.499 m) and weight is 116 lb (52.6 kg). Her temperature is 97 F (36.1 C) (abnormal). Her blood pressure is 159/92 (abnormal) and her pulse is 84. Her respiration is 16 and oxygen saturation is 100%.   Reason for encounter:   Endorsing increased bilateral knee pain, worse with weightbearing due to knee OA,  last risk injection was 12/11/2020 that provided approximately 75 to 80% pain relief for 11 months.  Patient would like to repeat Otherwise, no changes in medical history.  Continues Lyrica and hydrocodone as prescribed.  Utilizes Linzess for opioid-induced constipation.  Otherwise no side effects or changes in her medical history since her last visit. She is also endorsing low back pain with radiation down to her right knee.  Its been over 2 years since her previous lumbar epidural steroid injection.  I have offered her a repeat LESI however she would like to proceed with knee gel injections first.    Pharmacotherapy Assessment  Analgesic: Norco 7.5 mg TID PRN #90/month   Monitoring: [place] PMP: PDMP reviewed during this encounter.       Pharmacotherapy: No side-effects or adverse reactions reported. Compliance: No problems identified. Effectiveness: Clinically acceptable.  UDS:  Summary  Date Value Ref Range Status  06/26/2021 Note  Final    Comment:    ==================================================================== [employer] Select 13 (MW) ==================================================================== Test                             Result       Flag       Units  Drug Present not Declared for Prescription Verification   Alcohol, Ethyl                 0.066        UNEXPECTED g/dL    Sources of ethyl alcohol include alcoholic beverages or as a    fermentation product of glucose; glucose is present in this specimen.    Interpret result with caution, as the presence of ethyl alcohol  is    likely due, at least in part, to fermentation of glucose.  Drug Absent but Declared for Prescription Verification   Hydrocodone                    Not Detected UNEXPECTED ng/mg creat ==================================================================== Test                      Result    Flag   Units      Ref Range   Creatinine              34               mg/dL       >=20 ==================================================================== Declared Medications:  The flagging and interpretation on this report are based on the  following declared medications.  Unexpected results may arise from  inaccuracies in the declared medications.   **Note: The testing scope of this panel includes these medications:   Hydrocodone (Norco)   **Note: The testing scope of this panel does not include the  following reported medications:   Acetaminophen (Norco)  Albuterol (Ventolin HFA)  Aripiprazole (Abilify)  Aspirin  Budesonide (Symbicort)  Cetirizine (Zyrtec)  Famotidine (Pepcid)  Fluticasone (Flonase)  Formoterol (Symbicort)  Formoterol (Dulera)  Insulin (NovoLog)  Iron  Linaclotide (Linzess)  Losartan (Cozaar)  Meclizine (Antivert)  Metformin (Glucophage)  Mirtazapine (Remeron)  Mometasone (Dulera)  Montelukast (Singulair)  Multivitamin  Ondansetron (Zofran)  Pregabalin (Lyrica)  Propranolol (Inderal)  Rizatriptan (Maxalt)  Rosuvastatin (Crestor)  Solifenacin (Vesicare)  Topiramate (Topamax)  Trazodone (Desyrel)  Venlafaxine (Effexor)  Vitamin D2 ==================================================================== For clinical consultation, please call [phone]. ====================================================================         ROS  Constitutional: Denies any fever or chills Gastrointestinal: No reported hemesis, hematochezia, vomiting, or acute GI distress Musculoskeletal:  Bilateral knee pain Neurological: No reported episodes of acute onset apraxia, aphasia, dysarthria, agnosia, amnesia, paralysis, loss of coordination, or loss of consciousness  Medication Review  ARIPiprazole, Alive Once Daily Womens, HYDROcodone-acetaminophen, Insulin Pen Needle, albuterol, aspirin, budesonide-formoterol, cetirizine, ergocalciferol, famotidine, fluticasone, glucose blood, insulin aspart protamine - aspart, lidocaine, linaclotide,  losartan, meclizine, metFORMIN, mirtazapine, montelukast, nicotine, ondansetron, pregabalin, propranolol, rizatriptan, rosuvastatin, solifenacin, tiotropium, topiramate, traZODone, and venlafaxine XR  History Review  Allergy: [name] is allergic to tramadol, benadryl [diphenhydramine hcl], bextra [valdecoxib], invokana [canagliflozin], and sulfamethoxazole-trimethoprim. Drug: [name]  reports that she does not currently use drugs. Alcohol:  reports current alcohol use of about 2.0 standard drinks of alcohol per week. Tobacco:  reports that she has been smoking cigarettes. She has a 7.00 pack-year smoking history. She has been exposed to tobacco smoke. She has quit using smokeless tobacco. Social: [name]  reports that she has been smoking cigarettes. She has a 7.00 pack-year smoking history. She has been exposed to tobacco smoke. She has quit using smokeless tobacco. She reports current alcohol use of about 2.0 standard drinks of alcohol per week. She reports that she does not currently use drugs. Medical:  has a past medical history of Abnormal CBC, Asthma, Asthma, BPPV (benign paroxysmal positional vertigo), Cardiac murmur, unspecified (08/28/2016), COPD (chronic obstructive pulmonary disease) (HCC), CPAP (continuous positive airway pressure) dependence, Degenerative disc disease, lumbar, Depression, Diabetes mellitus, Diverticulitis (10/2016), Elevated LFTs, Fatty liver, Fibromyalgia, Fibromyalgia, GERD (gastroesophageal reflux disease), Headache, History of degenerative disc disease, Hyperlipidemia, Hypertension, and Sleep apnea. Surgical: [name]  has a past surgical history that includes Abdominal surgery; Abdominal hysterectomy; Esophagogastroduodenoscopy (egd) with propofol (N/A,  02/20/2017); Colonoscopy with propofol (N/A, 02/20/2017); Upper esophageal endoscopic ultrasound (eus) (N/A, 04/03/2017); Diverticulitis; EUS (N/A, 01/08/2018); Cholecystectomy (N/A,  04/27/2018); Cataract extraction w/PHACO (Left, 09/07/2019); and Cataract extraction w/PHACO (Right, 09/28/2019). Family: family history includes Diabetes in her mother; Hypertension in her mother.  Laboratory Chemistry Profile   Renal Lab Results  Component Value Date   BUN 9 07/11/2021   CREATININE 0.81 07/11/2021   BCR 14 11/27/2017   GFRAA >60 08/09/2019   GFRNONAA >60 07/11/2021     Hepatic Lab Results  Component Value Date   AST 14 (L) 07/11/2021   ALT 30 07/11/2021   ALBUMIN 3.9 07/11/2021   ALKPHOS 117 07/11/2021   LIPASE 36 07/11/2021   AMMONIA 46 (H) 04/05/2021     Electrolytes Lab Results  Component Value Date   NA 137 07/11/2021   K 4.0 07/11/2021   CL 103 07/11/2021   CALCIUM 9.6 07/11/2021   MG 1.7 04/05/2021     Bone No results found for: "VD25OH", "VD125OH2TOT", "PI9518AC1", "YS0630ZS0", "25OHVITD1", "25OHVITD2", "10XNATFT7", "TESTOFREE", "TESTOSTERONE"   Inflammation (CRP: Acute Phase) (ESR: Chronic Phase) Lab Results  Component Value Date   CRP <1 11/27/2017   ESRSEDRATE 17 11/27/2017   LATICACIDVEN 1.3 04/05/2021       Note: Above Lab results reviewed.  Recent Imaging Review  CT CHEST WO CONTRAST CLINICAL DATA:  Shortness of breath  EXAM: CT CHEST WITHOUT CONTRAST  TECHNIQUE: Multidetector CT imaging of the chest was performed following the standard protocol without IV contrast.  RADIATION DOSE REDUCTION: This exam was performed according to the departmental dose-optimization program which includes automated exposure control, adjustment of the mA and/or kV according to patient size and/or use of iterative reconstruction technique.  COMPARISON:  07/11/2021  FINDINGS: Cardiovascular: Aortic atherosclerosis. Cardiomegaly. Left coronary artery calcifications. Trace pericardial effusion. Enlargement of the main pulmonary artery, measuring up to 3.6 cm in caliber.  Mediastinum/Nodes: No enlarged mediastinal, hilar, or axillary  lymph nodes. Thyroid gland, trachea, and esophagus demonstrate no significant findings.  Lungs/Pleura: Mild, bandlike scarring of the bilateral lung bases. Minimal centrilobular and paraseptal emphysema. Background of fine centrilobular pulmonary nodules, most concentrated in the apices. No pleural effusion or pneumothorax.  Upper Abdomen: No acute abnormality.  Musculoskeletal: No chest wall abnormality. No suspicious osseous lesions identified.  IMPRESSION: 1. Minimal emphysema.  No acute airspace disease. 2. Background of fine centrilobular pulmonary nodules, most concentrated in the apices, consistent with smoking-related respiratory bronchiolitis. 3. Cardiomegaly and coronary artery disease. 4. Enlargement of the main pulmonary artery, as can be seen in pulmonary hypertension.  Aortic Atherosclerosis (ICD10-I70.0) and Emphysema (ICD10-J43.9).  Electronically Signed   By: Delanna Ahmadi M.D.   On: 08/10/2021 16:13  Note: Reviewed        Physical Exam  General appearance: Well nourished, well developed, and well hydrated. In no apparent acute distress Mental status: Alert, oriented x 3 (person, place, & time)       Respiratory: No evidence of acute respiratory distress Eyes: PERLA Vitals: BP (!) 159/92   Pulse 84   Temp (!) 97 F (36.1 C)   Resp 16   Ht _0  (1.499 m)   Wt 116 lb (52.6 kg)   LMP  (LMP Unknown) Comment: "many years"  SpO2 100%   BMI 23.43 kg/m  BMI: Estimated body mass index is 23.43 kg/m as calculated from the following:   Height as of this encounter: _1  (1.499 m).   Weight as of this encounter: 116 lb (52.6 kg). Ideal:  Patient must be at least 60 in tall to calculate ideal body weight  Bilateral knee pain, worse with weightbearing more pronounced on the medial aspect  Assessment   Status Diagnosis  Having a Flare-up Having a Flare-up Having a Flare-up 1. Primary osteoarthritis of right knee   2. Arthritis of left knee   3.  Chronic myofascial pain   4. Chronic, continuous use of opioids   5. Lumbar radiculopathy   6. Chronic pain syndrome       Plan of Care  [name] has a current medication list which includes the following long-term medication(s): albuterol, aripiprazole, budesonide-formoterol, famotidine, insulin aspart protamine - aspart, losartan, metformin, mirtazapine, pregabalin, propranolol, rizatriptan, topiramate, trazodone, and venlafaxine xr.  1. Primary osteoarthritis of right knee - HYDROcodone-acetaminophen (NORCO) 7.5-325 MG tablet; Take 1 tablet by mouth 3 (three) times daily as needed for severe pain. Must last 30 days  Dispense: 90 tablet; Refill: 0 - HYDROcodone-acetaminophen (NORCO) 7.5-325 MG tablet; Take 1 tablet by mouth 3 (three) times daily as needed for severe pain. Must last 30 days  Dispense: 90 tablet; Refill: 0 - HYDROcodone-acetaminophen (NORCO) 7.5-325 MG tablet; Take 1 tablet by mouth 3 (three) times daily as needed for severe pain. Must last 30 days  Dispense: 90 tablet; Refill: 0 - KNEE INJECTION; Future  2. Arthritis of left knee - HYDROcodone-acetaminophen (NORCO) 7.5-325 MG tablet; Take 1 tablet by mouth 3 (three) times daily as needed for severe pain. Must last 30 days  Dispense: 90 tablet; Refill: 0 - HYDROcodone-acetaminophen (NORCO) 7.5-325 MG tablet; Take 1 tablet by mouth 3 (three) times daily as needed for severe pain. Must last 30 days  Dispense: 90 tablet; Refill: 0 - HYDROcodone-acetaminophen (NORCO) 7.5-325 MG tablet; Take 1 tablet by mouth 3 (three) times daily as needed for severe pain. Must last 30 days  Dispense: 90 tablet; Refill: 0 - KNEE INJECTION; Future  3. Chronic myofascial pain - HYDROcodone-acetaminophen (NORCO) 7.5-325 MG tablet; Take 1 tablet by mouth 3 (three) times daily as needed for severe pain. Must last 30 days  Dispense: 90 tablet; Refill: 0 - HYDROcodone-acetaminophen (NORCO) 7.5-325 MG tablet; Take 1 tablet by mouth 3  (three) times daily as needed for severe pain. Must last 30 days  Dispense: 90 tablet; Refill: 0 - HYDROcodone-acetaminophen (NORCO) 7.5-325 MG tablet; Take 1 tablet by mouth 3 (three) times daily as needed for severe pain. Must last 30 days  Dispense: 90 tablet; Refill: 0  4. Chronic, continuous use of opioids - HYDROcodone-acetaminophen (NORCO) 7.5-325 MG tablet; Take 1 tablet by mouth 3 (three) times daily as needed for severe pain. Must last 30 days  Dispense: 90 tablet; Refill: 0 - HYDROcodone-acetaminophen (NORCO) 7.5-325 MG tablet; Take 1 tablet by mouth 3 (three) times daily as needed for severe pain. Must last 30 days  Dispense: 90 tablet; Refill: 0 - HYDROcodone-acetaminophen (NORCO) 7.5-325 MG tablet; Take 1 tablet by mouth 3 (three) times daily as needed for severe pain. Must last 30 days  Dispense: 90 tablet; Refill: 0  5. Lumbar radiculopathy - HYDROcodone-acetaminophen (NORCO) 7.5-325 MG tablet; Take 1 tablet by mouth 3 (three) times daily as needed for severe pain. Must last 30 days  Dispense: 90 tablet; Refill: 0 - HYDROcodone-acetaminophen (NORCO) 7.5-325 MG tablet; Take 1 tablet by mouth 3 (three) times daily as needed for severe pain. Must last 30 days  Dispense: 90 tablet; Refill: 0 - HYDROcodone-acetaminophen (NORCO) 7.5-325 MG tablet; Take 1 tablet by mouth 3 (three) times daily as  needed for severe pain. Must last 30 days  Dispense: 90 tablet; Refill: 0  6. Chronic pain syndrome - HYDROcodone-acetaminophen (NORCO) 7.5-325 MG tablet; Take 1 tablet by mouth 3 (three) times daily as needed for severe pain. Must last 30 days  Dispense: 90 tablet; Refill: 0 - HYDROcodone-acetaminophen (NORCO) 7.5-325 MG tablet; Take 1 tablet by mouth 3 (three) times daily as needed for severe pain. Must last 30 days  Dispense: 90 tablet; Refill: 0 - HYDROcodone-acetaminophen (NORCO) 7.5-325 MG tablet; Take 1 tablet by mouth 3 (three) times daily as needed for severe pain. Must last 30 days   Dispense: 90 tablet; Refill: 0 - KNEE INJECTION; Future - ToxASSURE Select 13 (MW), Urine    Pharmacotherapy (Medications Ordered): Meds ordered this encounter  Medications   HYDROcodone-acetaminophen (NORCO) 7.5-325 MG tablet    Sig: Take 1 tablet by mouth 3 (three) times daily as needed for severe pain. Must last 30 days    Dispense:  90 tablet    Refill:  0    Chronic Pain: STOP Act (Not applicable) Fill 1 day early if closed on refill date. Avoid benzodiazepines within 8 hours of opioids   HYDROcodone-acetaminophen (NORCO) 7.5-325 MG tablet    Sig: Take 1 tablet by mouth 3 (three) times daily as needed for severe pain. Must last 30 days    Dispense:  90 tablet    Refill:  0    Chronic Pain: STOP Act (Not applicable) Fill 1 day early if closed on refill date. Avoid benzodiazepines within 8 hours of opioids   HYDROcodone-acetaminophen (NORCO) 7.5-325 MG tablet    Sig: Take 1 tablet by mouth 3 (three) times daily as needed for severe pain. Must last 30 days    Dispense:  90 tablet    Refill:  0    Chronic Pain: STOP Act (Not applicable) Fill 1 day early if closed on refill date. Avoid benzodiazepines within 8 hours of opioids   Orders Placed This Encounter  Procedures   KNEE INJECTION    Standing Status:   Future    Standing Expiration Date:   12/14/2022    Scheduling Instructions:     B/L MONOVISC    Order Specific Question:   Where will this procedure be performed?    Answer:   [facility]   ToxASSURE Select 13 (MW), Urine    Volume: 30 ml(s). Minimum 3 ml of urine is needed. Document temperature of fresh sample. Indications: Long term (current) use of opiate analgesic (Z61.096)    Order Specific Question:   Release to patient    Answer:   Immediate     Follow-up plan:   Return in about 2 weeks (around 12/27/2021) for B/L Monovisc.   Recent Visits Date Type Provider Dept  09/18/21 Office Visit Gillis Santa, MD [facility]  Showing recent visits  within past 90 days and meeting all other requirements Today's Visits Date Type Provider Dept  12/13/21 Office Visit Gillis Santa, MD [facility]  Showing today's visits and meeting all other requirements Future Appointments No visits were found meeting these conditions. Showing future appointments within next 90 days and meeting all other requirements  I discussed the assessment and treatment plan with the patient. The patient was provided an opportunity to ask questions and all were answered. The patient agreed with the plan and demonstrated an understanding of the instructions.  Patient advised to call back or seek an in-person evaluation if the symptoms or condition worsens.  Duration  of encounter: 30 minutes.  Note by: Gillis Santa, MD Date: 12/13/2021; Time: 11:48 AM

## 2021-12-13 NOTE — Patient Instructions (Signed)

## 2021-12-13 NOTE — Progress Notes (Signed)
Nursing Pain Medication Assessment:  Safety precautions to be maintained throughout the outpatient stay will include: orient to surroundings, keep bed in low position, maintain call bell within reach at all times, provide assistance with transfer out of bed and ambulation.  Medication Inspection Compliance: Pill count conducted under aseptic conditions, in front of the patient. Neither the pills nor the bottle was removed from the patient's sight at any time. Once count was completed pills were immediately returned to the patient in their original bottle.  Medication: Hydrocodone/APAP Pill/Patch Count: 41/90 Pill/Patch Appearance: Markings consistent with prescribed medication Bottle Appearance: Standard pharmacy container. Clearly labeled. Filled Date: 09/ 05 / 2023 Last Medication intake:  Today

## 2021-12-19 ENCOUNTER — Encounter: Payer: Self-pay | Admitting: Internal Medicine

## 2021-12-19 ENCOUNTER — Encounter
Admission: RE | Disposition: A | Discharge status: Home / Self Care | Payer: Self-pay | Source: Home / Self Care | Attending: Internal Medicine

## 2021-12-19 ENCOUNTER — Other Ambulatory Visit: Payer: Self-pay

## 2021-12-19 ENCOUNTER — Ambulatory Visit
Admission: RE | Admit: 2021-12-19 | Discharge: 2021-12-19 | Disposition: A | Discharge status: Home / Self Care | Payer: Medicaid Other | Attending: Internal Medicine | Admitting: Internal Medicine

## 2021-12-19 DIAGNOSIS — I200 Unstable angina: Secondary | ICD-10-CM | POA: Diagnosis present

## 2021-12-19 DIAGNOSIS — I272 Pulmonary hypertension, unspecified: Secondary | ICD-10-CM | POA: Insufficient documentation

## 2021-12-19 HISTORY — PX: RIGHT/LEFT HEART CATH AND CORONARY ANGIOGRAPHY: CATH118266

## 2021-12-19 LAB — GLUCOSE, CAPILLARY: Glucose-Capillary: 295 mg/dL — ABNORMAL HIGH (ref 70–99)

## 2021-12-19 LAB — TOXASSURE SELECT 13 (MW), URINE

## 2021-12-19 LAB — CARDIAC CATHETERIZATION: Cath EF Quantitative: 60 %

## 2021-12-19 SURGERY — RIGHT/LEFT HEART CATH AND CORONARY ANGIOGRAPHY
Anesthesia: Moderate Sedation | Laterality: Bilateral

## 2021-12-19 MED ORDER — SODIUM CHLORIDE 0.9 % WEIGHT BASED INFUSION
1.0000 mL/kg/h | INTRAVENOUS | Status: DC
  Administered 2021-12-19: 1 mL/kg/h via INTRAVENOUS

## 2021-12-19 MED ORDER — HEPARIN (PORCINE) IN NACL 1000-0.9 UT/500ML-% IV SOLN
INTRAVENOUS | Status: DC | PRN
  Administered 2021-12-19 (×2): 500 mL

## 2021-12-19 MED ORDER — LIDOCAINE HCL (PF) 1 % IJ SOLN
INTRAMUSCULAR | Status: DC | PRN
  Administered 2021-12-19 (×2): 2 mL

## 2021-12-19 MED ORDER — HYDRALAZINE HCL 20 MG/ML IJ SOLN: 10.0000 mg | INTRAMUSCULAR | Status: DC | PRN

## 2021-12-19 MED ORDER — SODIUM CHLORIDE 0.9% FLUSH
3.0000 mL | Freq: Two times a day (BID) | INTRAVENOUS | Status: DC
  Administered 2021-12-19: 3 mL via INTRAVENOUS

## 2021-12-19 MED ORDER — SODIUM CHLORIDE 0.9% FLUSH: 3.0000 mL | Freq: Two times a day (BID) | INTRAVENOUS | Status: DC

## 2021-12-19 MED ORDER — SODIUM CHLORIDE 0.9 % IV SOLN: 250.0000 mL | INTRAVENOUS | Status: DC | PRN

## 2021-12-19 MED ORDER — LIDOCAINE HCL 1 % IJ SOLN
INTRAMUSCULAR | Status: AC
  Filled 2021-12-19: qty 20

## 2021-12-19 MED ORDER — ACETAMINOPHEN 325 MG PO TABS: 650.0000 mg | ORAL_TABLET | ORAL | Status: DC | PRN

## 2021-12-19 MED ORDER — ASPIRIN 81 MG PO CHEW
CHEWABLE_TABLET | ORAL | Status: DC | PRN
  Administered 2021-12-19: 324 mg via ORAL

## 2021-12-19 MED ORDER — SODIUM CHLORIDE 0.9% FLUSH: 3.0000 mL | INTRAVENOUS | Status: DC | PRN

## 2021-12-19 MED ORDER — LABETALOL HCL 5 MG/ML IV SOLN: 10.0000 mg | INTRAVENOUS | Status: DC | PRN

## 2021-12-19 MED ORDER — MIDAZOLAM HCL 2 MG/2ML IJ SOLN
INTRAMUSCULAR | Status: AC
  Filled 2021-12-19: qty 2

## 2021-12-19 MED ORDER — HEPARIN SODIUM (PORCINE) 1000 UNIT/ML IJ SOLN
INTRAMUSCULAR | Status: AC
  Filled 2021-12-19: qty 10

## 2021-12-19 MED ORDER — SODIUM CHLORIDE 0.9 % WEIGHT BASED INFUSION: 1.0000 mL/kg/h | INTRAVENOUS | Status: DC

## 2021-12-19 MED ORDER — ONDANSETRON HCL 4 MG/2ML IJ SOLN: 4.0000 mg | Freq: Four times a day (QID) | INTRAMUSCULAR | Status: DC | PRN

## 2021-12-19 MED ORDER — HEPARIN SODIUM (PORCINE) 1000 UNIT/ML IJ SOLN
INTRAMUSCULAR | Status: DC | PRN
  Administered 2021-12-19: 2500 [IU] via INTRAVENOUS

## 2021-12-19 MED ORDER — MIDAZOLAM HCL 2 MG/2ML IJ SOLN
INTRAMUSCULAR | Status: DC | PRN
  Administered 2021-12-19: 1 mg via INTRAVENOUS

## 2021-12-19 MED ORDER — VERAPAMIL HCL 2.5 MG/ML IV SOLN
INTRAVENOUS | Status: AC
  Filled 2021-12-19: qty 2

## 2021-12-19 MED ORDER — VERAPAMIL HCL 2.5 MG/ML IV SOLN
INTRAVENOUS | Status: DC | PRN
  Administered 2021-12-19: 2.5 mg via INTRA_ARTERIAL

## 2021-12-19 MED ORDER — HEPARIN (PORCINE) IN NACL 1000-0.9 UT/500ML-% IV SOLN
INTRAVENOUS | Status: AC
  Filled 2021-12-19: qty 1000

## 2021-12-19 MED ORDER — ASPIRIN 81 MG PO CHEW
CHEWABLE_TABLET | ORAL | Status: AC
  Filled 2021-12-19: qty 4

## 2021-12-19 MED ORDER — FENTANYL CITRATE (PF) 100 MCG/2ML IJ SOLN
INTRAMUSCULAR | Status: DC | PRN
  Administered 2021-12-19: 25 ug via INTRAVENOUS

## 2021-12-19 MED ORDER — FENTANYL CITRATE (PF) 100 MCG/2ML IJ SOLN
INTRAMUSCULAR | Status: AC
  Filled 2021-12-19: qty 2

## 2021-12-19 MED ORDER — IOHEXOL 300 MG/ML  SOLN
INTRAMUSCULAR | Status: DC | PRN
  Administered 2021-12-19: 45 mL

## 2021-12-19 MED ORDER — SODIUM CHLORIDE 0.9 % WEIGHT BASED INFUSION
3.0000 mL/kg/h | INTRAVENOUS | Status: AC
  Administered 2021-12-19: 3 mL/kg/h via INTRAVENOUS

## 2021-12-19 SURGICAL SUPPLY — 13 items
BAND CMPR LRG ZPHR (HEMOSTASIS) ×1
BAND ZEPHYR COMPRESS 30 LONG (HEMOSTASIS) IMPLANT
CATH 5FR JL3.5 JR4 ANG PIG MP (CATHETERS) IMPLANT
CATH BALLN WEDGE 5F 110CM (CATHETERS) IMPLANT
DRAPE BRACHIAL (DRAPES) IMPLANT
GLIDESHEATH SLEND SS 6F .021 (SHEATH) IMPLANT
GUIDEWIRE INQWIRE 1.5J.035X260 (WIRE) IMPLANT
INQWIRE 1.5J .035X260CM (WIRE) ×1
PACK CARDIAC CATH (CUSTOM PROCEDURE TRAY) ×1 IMPLANT
PROTECTION STATION PRESSURIZED (MISCELLANEOUS) ×1
SET ATX SIMPLICITY (MISCELLANEOUS) IMPLANT
SHEATH GLIDE SLENDER 4/5FR (SHEATH) IMPLANT
STATION PROTECTION PRESSURIZED (MISCELLANEOUS) IMPLANT

## 2021-12-31 ENCOUNTER — Other Ambulatory Visit: Payer: Self-pay

## 2021-12-31 ENCOUNTER — Encounter: Payer: Self-pay | Admitting: Student in an Organized Health Care Education/Training Program

## 2021-12-31 ENCOUNTER — Ambulatory Visit
Payer: Medicaid Other | Attending: Student in an Organized Health Care Education/Training Program | Admitting: Student in an Organized Health Care Education/Training Program

## 2021-12-31 VITALS — BP 138/79 | HR 107 | Temp 97.2°F | Resp 16 | Ht 59.0 in | Wt 116.0 lb

## 2021-12-31 DIAGNOSIS — M1711 Unilateral primary osteoarthritis, right knee: Secondary | ICD-10-CM

## 2021-12-31 DIAGNOSIS — M1712 Unilateral primary osteoarthritis, left knee: Secondary | ICD-10-CM | POA: Diagnosis present

## 2021-12-31 MED ORDER — HYALURONAN 88 MG/4ML IX SOSY
88.0000 mg | PREFILLED_SYRINGE | Freq: Once | INTRA_ARTICULAR | Status: AC
  Administered 2021-12-31: 88 mg via INTRA_ARTICULAR

## 2021-12-31 MED ORDER — LIDOCAINE HCL 2 % IJ SOLN
20.0000 mL | Freq: Once | INTRAMUSCULAR | Status: AC
  Administered 2021-12-31: 400 mg

## 2021-12-31 MED ORDER — LIDOCAINE HCL 2 % IJ SOLN
INTRAMUSCULAR | Status: AC
  Filled 2021-12-31: qty 20

## 2021-12-31 NOTE — Progress Notes (Signed)
PROVIDER NOTE: Information contained herein reflects review and annotations entered in association with encounter. Interpretation of such information and data should be left to medically-trained personnel. Information provided to patient can be located elsewhere in the medical record under "Patient Instructions". Document created using STT-dictation technology, any transcriptional errors that may result from process are unintentional.    Patient: [name]  Service Category: Procedure  Provider: Gillis Santa, MD  DOB: [birth year]  DOS: 12/31/2021  Location: [facility]  MRN: [id number]  Setting: Ambulatory - outpatient  Referring Provider: Margarita Rana, MD  Type: Established Patient  Specialty: Interventional Pain Management  PCP: Margarita Rana, MD   Primary Reason for Visit: Interventional Pain Management Treatment. CC:  Right and left knee pain  Procedure:          Anesthesia, Analgesia, Anxiolysis:  Type: Therapeutic Intra-Articular  MONOVISC  Knee Injection #4 (previously performed in September 2022) Region: Medial infrapatellar Knee Region Level: Knee Joint Laterality: Bilateral  Type: Local Anesthesia Indication(s): Analgesia         Local Anesthetic: Lidocaine 1-2% Route: Infiltration ([place]/IM) IV Access: Declined Sedation: Declined   Position: Sitting   Indications: 1. Primary osteoarthritis of right knee   2. Arthritis of left knee     Pain Score: Pre-procedure: 9 /10 Post-procedure: 9 /10   Pre-op H&P Assessment:  Ms. Valis is a 61 y.o. (year old), female patient, seen today for interventional treatment. She  has a past surgical history that includes Abdominal surgery; Abdominal hysterectomy; Esophagogastroduodenoscopy (egd) with propofol (N/A, 02/20/2017); Colonoscopy with propofol (N/A, 02/20/2017); Upper esophageal endoscopic ultrasound (eus) (N/A, 04/03/2017); Diverticulitis; EUS (N/A, 01/08/2018); Cholecystectomy (N/A, 04/27/2018);  Cataract extraction w/PHACO (Left, 09/07/2019); Cataract extraction w/PHACO (Right, 09/28/2019); and RIGHT/LEFT HEART CATH AND CORONARY ANGIOGRAPHY (Bilateral, 12/19/2021). Ms. Kirchner has a current medication list which includes the following prescription(s): accu-chek guide, albuterol, aripiprazole, aspirin, budesonide-formoterol, cetirizine, ergocalciferol, famotidine, fluticasone, hydrocodone-acetaminophen, [START ON 01/25/2022] hydrocodone-acetaminophen, [START ON 02/24/2022] hydrocodone-acetaminophen, insulin aspart protamine - aspart, insulin pen needle, lidocaine, linzess, losartan, meclizine, metformin, mirtazapine, montelukast, alive once daily womens, ondansetron, pregabalin, propranolol, rizatriptan, rosuvastatin, solifenacin, tiotropium, trazodone, venlafaxine xr, nicotine, and topiramate, and the following Facility-Administered Medications: hyaluronan, hyaluronan, and lidocaine. Her primarily concern today is the Knee Pain (Bilateral )   Initial Vital Signs:  Pulse/HCG Rate: (!) 107  Temp: (!) 97.2 F (36.2 C) Resp: 16 BP: 138/79 SpO2: 94 %  BMI: Estimated body mass index is 23.43 kg/m as calculated from the following:   Height as of this encounter: 4\' 11"  (1.499 m).   Weight as of this encounter: 116 lb (52.6 kg).  Risk Assessment: Allergies: Reviewed. She is allergic to tramadol, benadryl [diphenhydramine hcl], bextra [valdecoxib], invokana [canagliflozin], and sulfamethoxazole-trimethoprim.  Allergy Precautions: None required Coagulopathies: Reviewed. None identified.  Blood-thinner therapy: None at this time Active Infection(s): Reviewed. None identified. Ms. Farinacci is afebrile  Site Confirmation: Ms. Lichter was asked to confirm the procedure and laterality before marking the site Procedure checklist: Completed Consent: Before the procedure and under the influence of no sedative(s), amnesic(s), or anxiolytics, the patient was informed of the treatment options, risks  and possible complications. To fulfill our ethical and legal obligations, as recommended by the American Medical Association's Code of Ethics, I have informed the patient of my clinical impression; the nature and purpose of the treatment or procedure; the risks, benefits, and possible complications of the intervention; the alternatives, including doing nothing; the risk(s) and benefit(s) of the alternative treatment(s) or procedure(s); and  the risk(s) and benefit(s) of doing nothing. The patient was provided information about the general risks and possible complications associated with the procedure. These may include, but are not limited to: failure to achieve desired goals, infection, bleeding, organ or nerve damage, allergic reactions, paralysis, and death. In addition, the patient was informed of those risks and complications associated to the procedure, such as failure to decrease pain; infection; bleeding; organ or nerve damage with subsequent damage to sensory, motor, and/or autonomic systems, resulting in permanent pain, numbness, and/or weakness of one or several areas of the body; allergic reactions; (i.e.: anaphylactic reaction); and/or death. Furthermore, the patient was informed of those risks and complications associated with the medications. These include, but are not limited to: allergic reactions (i.e.: anaphylactic or anaphylactoid reaction(s)); adrenal axis suppression; blood sugar elevation that in diabetics may result in ketoacidosis or comma; water retention that in patients with history of congestive heart failure may result in shortness of breath, pulmonary edema, and decompensation with resultant heart failure; weight gain; swelling or edema; medication-induced neural toxicity; particulate matter embolism and blood vessel occlusion with resultant organ, and/or nervous system infarction; and/or aseptic necrosis of one or more joints. Finally, the patient was informed that Medicine is not  an exact science; therefore, there is also the possibility of unforeseen or unpredictable risks and/or possible complications that may result in a catastrophic outcome. The patient indicated having understood very clearly. We have given the patient no guarantees and we have made no promises. Enough time was given to the patient to ask questions, all of which were answered to the patient's satisfaction. Ms. Ortman has indicated that she wanted to continue with the procedure. Attestation: I, the ordering provider, attest that I have discussed with the patient the benefits, risks, side-effects, alternatives, likelihood of achieving goals, and potential problems during recovery for the procedure that I have provided informed consent. Date  Time: 12/31/2021 11:18 AM  Pre-Procedure Preparation:  Monitoring: As per clinic protocol. Respiration, ETCO2, SpO2, BP, heart rate and rhythm monitor placed and checked for adequate function Safety Precautions: Patient was assessed for positional comfort and pressure points before starting the procedure. Time-out: I initiated and conducted the "Time-out" before starting the procedure, as per protocol. The patient was asked to participate by confirming the accuracy of the "Time Out" information. Verification of the correct person, site, and procedure were performed and confirmed by me, the nursing staff, and the patient. "Time-out" conducted as per Joint Commission's Universal Protocol (UP.01.01.01). Time: 1201  Description of Procedure:          Target Area: Knee Joint Approach: Just above the Medial tibial plateau, lateral to the infrapatellar tendon. Area Prepped: Entire knee area, from the mid-thigh to the mid-shin. DuraPrep (Iodine Povacrylex [0.7% available iodine] and Isopropyl Alcohol, 74% w/w) Safety Precautions: Aspiration looking for blood return was conducted prior to all injections. At no point did we inject any substances, as a needle was being  advanced. No attempts were made at seeking any paresthesias. Safe injection practices and needle disposal techniques used. Medications properly checked for expiration dates. SDV (single dose vial) medications used. Description of the Procedure: Protocol guidelines were followed. The patient was placed in position over the fluoroscopy table. The target area was identified and the area prepped in the usual manner. Skin & deeper tissues infiltrated with local anesthetic. Appropriate amount of time allowed to pass for local anesthetics to take effect. The procedure needles were then advanced to the target area. Proper needle  placement secured. Negative aspiration confirmed. Solution injected in intermittent fashion, asking for systemic symptoms every 0.5cc of injectate. The needles were then removed and the area cleansed, making sure to leave some of the prepping solution back to take advantage of its long term bactericidal properties. Vitals:   12/31/21 1135  BP: 138/79  Pulse: (!) 107  Resp: 16  Temp: (!) 97.2 F (36.2 C)  TempSrc: Temporal  SpO2: 94%  Weight: 116 lb (52.6 kg)  Height: 4\' 11"  (1.499 m)    Start Time: 1201 hrs. End Time: 1202 hrs. Materials:  Needle(s) Type: Regular needle Gauge: 25G Length: 1.5-in Medication(s): Please see orders for medications and dosing details. 4cc MONOVISC injected in each knee   Post-operative Assessment:  Post-procedure Vital Signs:  Pulse/HCG Rate: (!) 107  Temp: (!) 97.2 F (36.2 C) Resp: 16 BP: 138/79 SpO2: 94 %  EBL: None  Complications: No immediate post-treatment complications observed by team, or reported by patient.  Note: The patient tolerated the entire procedure well. A repeat set of vitals were taken after the procedure and the patient was kept under observation following institutional policy, for this type of procedure. Post-procedural neurological assessment was performed, showing return to baseline, prior to discharge. The  patient was provided with post-procedure discharge instructions, including a section on how to identify potential problems. Should any problems arise concerning this procedure, the patient was given instructions to immediately contact us, at any time, without hesitation. In any case, we plan to contact the patient by telephone for a follow-up status report regarding this interventional procedure.  Comments:  No additional relevant information.  Plan of Care   Chronic Opioid Analgesic:  Norco 7.5 mg TID PRN #90/month   Medications ordered for procedure: Meds ordered this encounter  Medications   Hyaluronan (MONOVISC) intra-articular injection 88 mg    Do not substitute. Deliver to facility day before procedure.   lidocaine (XYLOCAINE) 2 % (with pres) injection 400 mg   Hyaluronan (MONOVISC) intra-articular injection 88 mg    Do not substitute. Deliver to facility day before procedure.   Medications administered: Anhthu Perdew. Emry had no medications administered during this visit.  See the medical record for exact dosing, route, and time of administration.  Follow-up plan:   Return in about 5 weeks (around 02/04/2022) for Post Procedure Evaluation, virtual.    Recent Visits Date Type Provider Dept  12/13/21 Office Visit Gillis Santa, MD [facility]  Showing recent visits within past 90 days and meeting all other requirements Today's Visits Date Type Provider Dept  12/31/21 Procedure visit Gillis Santa, MD [facility]  Showing today's visits and meeting all other requirements Future Appointments Date Type Provider Dept  02/05/22 Appointment Gillis Santa, MD [facility]  03/14/22 Appointment Gillis Santa, MD [facility]  Showing future appointments within next 90 days and meeting all other requirements  Disposition: Discharge home  Discharge (Date  Time): 12/31/2021; 1204 hrs.   Primary Care Physician: Margarita Rana, MD Location:  [facility] Note by: Gillis Santa, MD Date: 12/31/2021; Time: 1:58 PM  Disclaimer:  Medicine is not an exact science. The only guarantee in medicine is that nothing is guaranteed. It is important to note that the decision to proceed with this intervention was based on the information collected from the patient. The Data and conclusions were drawn from the patient's questionnaire, the interview, and the physical examination. Because the information was provided in large part by the patient,  it cannot be guaranteed that it has not been purposely or unconsciously manipulated. Every effort has been made to obtain as much relevant data as possible for this evaluation. It is important to note that the conclusions that lead to this procedure are derived in large part from the available data. Always take into account that the treatment will also be dependent on availability of resources and existing treatment guidelines, considered by other Pain Management Practitioners as being common knowledge and practice, at the time of the intervention. For Medico-Legal purposes, it is also important to point out that variation in procedural techniques and pharmacological choices are the acceptable norm. The indications, contraindications, technique, and results of the above procedure should only be interpreted and judged by a Board-Certified Interventional Pain Specialist with extensive familiarity and expertise in the same exact procedure and technique.

## 2021-12-31 NOTE — Progress Notes (Signed)
Safety precautions to be maintained throughout the outpatient stay will include: orient to surroundings, keep bed in low position, maintain call bell within reach at all times, provide assistance with transfer out of bed and ambulation.  

## 2021-12-31 NOTE — Patient Instructions (Signed)

## 2022-01-01 ENCOUNTER — Telehealth: Payer: Self-pay

## 2022-01-01 NOTE — Telephone Encounter (Signed)
Post procedure phone call.  Patient states she is doing well.  

## 2022-02-05 ENCOUNTER — Encounter: Payer: Self-pay | Admitting: Student in an Organized Health Care Education/Training Program

## 2022-02-05 ENCOUNTER — Ambulatory Visit
Payer: Medicaid Other | Attending: Student in an Organized Health Care Education/Training Program | Admitting: Student in an Organized Health Care Education/Training Program

## 2022-02-05 DIAGNOSIS — M1712 Unilateral primary osteoarthritis, left knee: Secondary | ICD-10-CM | POA: Diagnosis not present

## 2022-02-05 DIAGNOSIS — M1711 Unilateral primary osteoarthritis, right knee: Secondary | ICD-10-CM

## 2022-02-05 NOTE — Progress Notes (Signed)
Patient: [name]  Service Category: E/M  Provider: Gillis Santa, MD  DOB: Oct 20, 1960  DOS: 02/05/2022  Location: Office  MRN: [id number]  Setting: Ambulatory outpatient  Referring Provider: Margarita Rana, MD  Type: Established Patient  Specialty: Interventional Pain Management  PCP: Margarita Rana, MD  Location: Remote location  Delivery: TeleHealth     Virtual Encounter - Pain Management PROVIDER NOTE: Information contained herein reflects review and annotations entered in association with encounter. Interpretation of such information and data should be left to medically-trained personnel. Information provided to patient can be located elsewhere in the medical record under "Patient Instructions". Document created using STT-dictation technology, any transcriptional errors that may result from process are unintentional.    Contact & Pharmacy Preferred: [phone] Home: [phone] (home) Mobile: [phone] (mobile) E-mail: [email or web address] [place], [place] [address] [facility] [place] [address] Phone: [phone] Fax: [phone]   Pre-screening  [name] offered "in-person" vs "virtual" encounter. She indicated preferring virtual for this encounter.   Reason COVID-19*  Social distancing based on CDC and AMA recommendations.   I contacted [name] on 02/05/2022 via telephone.      I clearly identified myself as Gillis Santa, MD. I verified that I was speaking with the correct person using two identifiers (Name: DEEANNA BEIGHTOL, and date of birth: 1960/07/07).  Consent I sought verbal advanced consent from [name] for virtual visit interactions. I informed [name] of possible security and privacy concerns, risks, and limitations associated with providing "not-in-person" medical evaluation and management services. I also informed [name] of the  availability of "in-person" appointments. Finally, I informed her that there would be a charge for the virtual visit and that she could be  personally, fully or partially, financially responsible for it. [name] expressed understanding and agreed to proceed.   Historic Elements   [name] is a 61 y.o. year old, female patient evaluated today after our last contact on 12/31/2021. [name]  has a past medical history of Abnormal CBC, Asthma, Asthma, BPPV (benign paroxysmal positional vertigo), Cardiac murmur, unspecified (08/28/2016), COPD (chronic obstructive pulmonary disease) ([place]), CPAP (continuous positive airway pressure) dependence, Degenerative disc disease, lumbar, Depression, Diabetes mellitus, Diverticulitis (10/2016), Elevated LFTs, Fatty liver, Fibromyalgia, Fibromyalgia, GERD (gastroesophageal reflux disease), Headache, History of degenerative disc disease, Hyperlipidemia, Hypertension, and Sleep apnea. She also  has a past surgical history that includes Abdominal surgery; Abdominal hysterectomy; Esophagogastroduodenoscopy (egd) with propofol (N/A, 02/20/2017); Colonoscopy with propofol (N/A, 02/20/2017); Upper esophageal endoscopic ultrasound (eus) (N/A, 04/03/2017); Diverticulitis; EUS (N/A, 01/08/2018); Cholecystectomy (N/A, 04/27/2018); Cataract extraction w/PHACO (Left, 09/07/2019); Cataract extraction w/PHACO (Right, 09/28/2019); and RIGHT/LEFT HEART CATH AND CORONARY ANGIOGRAPHY (Bilateral, 12/19/2021). [name] has a current medication list which includes the following prescription(s): accu-chek guide, albuterol, aripiprazole, aspirin, budesonide-formoterol, cetirizine, ergocalciferol, famotidine, fluticasone, hydrocodone-acetaminophen, [START ON 02/24/2022] hydrocodone-acetaminophen, insulin aspart protamine - aspart, insulin pen needle, lidocaine, linzess, losartan, meclizine, metformin, mirtazapine, montelukast, alive once daily womens, ondansetron, pregabalin,  propranolol, rizatriptan, rosuvastatin, solifenacin, tiotropium, trazodone, venlafaxine xr, nicotine, and topiramate. She  reports that she has been smoking cigarettes. She has a 7.00 pack-year smoking history. She has been exposed to tobacco smoke. She has quit using smokeless tobacco. She reports current alcohol use of about 2.0 standard drinks of alcohol per week. She reports that she does not currently use drugs. [name] is allergic to tramadol, benadryl [diphenhydramine hcl], bextra [valdecoxib], invokana [canagliflozin],  and sulfamethoxazole-trimethoprim.   HPI  Today, she is being contacted for a post-procedure assessment.   Post-procedure evaluation  Type: Therapeutic Intra-Articular  MONOVISC  Knee Injection #4 (previously performed in September 2022) Region: Medial infrapatellar Knee Region Level: Knee Joint Laterality: Bilateral Effectiveness:  Initial hour after procedure: 100 %  Subsequent 4-6 hours post-procedure: 100 %  Analgesia past initial 6 hours: 50 % (current)  Ongoing improvement:  Analgesic:  50% Function: Somewhat improved ROM: Somewhat improved   Pharmacotherapy Assessment   Opioid Analgesic: Norco 7.5 mg TID PRN #90/month   Monitoring: [place] PMP: PDMP reviewed during this encounter.       Pharmacotherapy: No side-effects or adverse reactions reported. Compliance: No problems identified. Effectiveness: Clinically acceptable. Plan: Refer to "POC". UDS:  Summary  Date Value Ref Range Status  12/13/2021 Note  Final    Comment:    ==================================================================== [employer] Select 13 (MW) ==================================================================== Test                             Result       Flag       Units  Drug Present and Declared for Prescription Verification   Hydrocodone                    4177         EXPECTED   ng/mg creat   Hydromorphone                  816          EXPECTED   ng/mg creat    Dihydrocodeine                 338          EXPECTED   ng/mg creat   Norhydrocodone                 6097         EXPECTED   ng/mg creat    Sources of hydrocodone include scheduled prescription medications.    Hydromorphone, dihydrocodeine and norhydrocodone are expected    metabolites of hydrocodone. Hydromorphone and dihydrocodeine are    also available as scheduled prescription medications.  ==================================================================== Test                      Result    Flag   Units      Ref Range   Creatinine              77               mg/dL      >=20 ==================================================================== Declared Medications:  The flagging and interpretation on this report are based on the  following declared medications.  Unexpected results may arise from  inaccuracies in the declared medications.   **Note: The testing scope of this panel includes these medications:   Hydrocodone (Norco)   **Note: The testing scope of this panel does not include the  following reported medications:   Acetaminophen (Norco)  Albuterol (Ventolin HFA)  Aripiprazole (Abilify)  Aspirin  Budesonide (Symbicort)  Cetirizine (Zyrtec)  Famotidine (Pepcid)  Fluticasone (Flonase)  Formoterol (Symbicort)  Insulin (NovoLog)  Iron  Lidocaine  Linaclotide (Linzess)  Losartan (Cozaar)  Meclizine (Antivert)  Metformin  Mirtazapine (Remeron)  Montelukast (Singulair)  Multivitamin  Nicotine  Ondansetron (Zofran)  Pregabalin (Lyrica)  Propranolol (Inderal)  Rizatriptan (Maxalt)  Rosuvastatin (Crestor)  Solifenacin (Vesicare)  Tiotropium (Spiriva)  Topiramate (Topamax)  Trazodone (Desyrel)  Venlafaxine (Effexor)  Vitamin D2 ==================================================================== For clinical consultation, please call [phone]. ====================================================================    No results found for: "CBDTHCR",  "D8THCCBX", "D9THCCBX"   Laboratory Chemistry Profile   Renal Lab Results  Component Value Date   BUN 9 07/11/2021   CREATININE 0.81 07/11/2021   BCR 14 11/27/2017   GFRAA >60 08/09/2019   GFRNONAA >60 07/11/2021    Hepatic Lab Results  Component Value Date   AST 14 (L) 07/11/2021   ALT 30 07/11/2021   ALBUMIN 3.9 07/11/2021   ALKPHOS 117 07/11/2021   LIPASE 36 07/11/2021   AMMONIA 46 (H) 04/05/2021    Electrolytes Lab Results  Component Value Date   NA 137 07/11/2021   K 4.0 07/11/2021   CL 103 07/11/2021   CALCIUM 9.6 07/11/2021   MG 1.7 04/05/2021    Bone No results found for: "VD25OH", "VD125OH2TOT", "DV7616WV3", "XT0626RS8", "25OHVITD1", "25OHVITD2", "54OEVOJJ0", "TESTOFREE", "TESTOSTERONE"  Inflammation (CRP: Acute Phase) (ESR: Chronic Phase) Lab Results  Component Value Date   CRP <1 11/27/2017   ESRSEDRATE 17 11/27/2017   LATICACIDVEN 1.3 04/05/2021         Note: Above Lab results reviewed.  Imaging  CARDIAC CATHETERIZATION   The left ventricular systolic function is normal.   LV end diastolic pressure is normal.   The left ventricular ejection fraction is 55-65% by visual estimate.   Hemodynamic findings consistent with mild pulmonary hypertension.   There is no mitral valve regurgitation.  Conclusion Right brachial right radial approach/ right left heart cath Right heart cath with mild pulmonary hypertension PA mean of 32 wedge of  17 Cardiac output of 6.0 Fick  Left heart cath Normal left ventricular function at 60% Coronaries Normal coronaries left dominant system No complications  Assessment  The primary encounter diagnosis was Primary osteoarthritis of right knee. A diagnosis of Arthritis of left knee was also pertinent to this visit.  Plan of Care  Positive response to previous Monovisc injection, will continue to monitor Keep MM visit for next month   Follow-up plan:   Return for Keep sch. appt.    Recent Visits Date Type  Provider Dept  12/31/21 Procedure visit Gillis Santa, MD [facility]  12/13/21 Office Visit Gillis Santa, MD [facility]  Showing recent visits within past 90 days and meeting all other requirements Today's Visits Date Type Provider Dept  02/05/22 Office Visit Gillis Santa, MD [facility]  Showing today's visits and meeting all other requirements Future Appointments Date Type Provider Dept  03/14/22 Appointment Gillis Santa, MD [facility]  Showing future appointments within next 90 days and meeting all other requirements  I discussed the assessment and treatment plan with the patient. The patient was provided an opportunity to ask questions and all were answered. The patient agreed with the plan and demonstrated an understanding of the instructions.  Patient advised to call back or seek an in-person evaluation if the symptoms or condition worsens.  Duration of encounter: 51mnutes.  Note by: BGillis Santa MD Date: 02/05/2022; Time: 10:35 AM

## 2022-03-14 ENCOUNTER — Ambulatory Visit
Payer: Medicaid Other | Attending: Student in an Organized Health Care Education/Training Program | Admitting: Student in an Organized Health Care Education/Training Program

## 2022-03-14 ENCOUNTER — Encounter: Payer: Self-pay | Admitting: Student in an Organized Health Care Education/Training Program

## 2022-03-14 DIAGNOSIS — M5416 Radiculopathy, lumbar region: Secondary | ICD-10-CM | POA: Diagnosis present

## 2022-03-14 DIAGNOSIS — G894 Chronic pain syndrome: Secondary | ICD-10-CM | POA: Diagnosis present

## 2022-03-14 DIAGNOSIS — M7918 Myalgia, other site: Secondary | ICD-10-CM | POA: Diagnosis present

## 2022-03-14 DIAGNOSIS — M1712 Unilateral primary osteoarthritis, left knee: Secondary | ICD-10-CM | POA: Diagnosis present

## 2022-03-14 DIAGNOSIS — F1190 Opioid use, unspecified, uncomplicated: Secondary | ICD-10-CM

## 2022-03-14 DIAGNOSIS — M1711 Unilateral primary osteoarthritis, right knee: Secondary | ICD-10-CM

## 2022-03-14 DIAGNOSIS — G8929 Other chronic pain: Secondary | ICD-10-CM | POA: Diagnosis present

## 2022-03-14 MED ORDER — PREGABALIN 100 MG PO CAPS: 100.0000 mg | ORAL_CAPSULE | Freq: Three times a day (TID) | ORAL | 5 refills | Status: DC

## 2022-03-14 MED ORDER — TOPIRAMATE 25 MG PO TABS: 25.0000 mg | ORAL_TABLET | Freq: Two times a day (BID) | ORAL | 5 refills | Status: AC

## 2022-03-14 MED ORDER — HYDROCODONE-ACETAMINOPHEN 7.5-325 MG PO TABS: 1.0000 | ORAL_TABLET | Freq: Three times a day (TID) | ORAL | 0 refills | Status: DC | PRN

## 2022-03-14 MED ORDER — HYDROCODONE-ACETAMINOPHEN 7.5-325 MG PO TABS: 1.0000 | ORAL_TABLET | Freq: Three times a day (TID) | ORAL | 0 refills | Status: AC | PRN

## 2022-03-14 NOTE — Progress Notes (Signed)
Nursing Pain Medication Assessment:  Safety precautions to be maintained throughout the outpatient stay will include: orient to surroundings, keep bed in low position, maintain call bell within reach at all times, provide assistance with transfer out of bed and ambulation.  Medication Inspection Compliance: Pill count conducted under aseptic conditions, in front of the patient. Neither the pills nor the bottle was removed from the patient's sight at any time. Once count was completed pills were immediately returned to the patient in their original bottle.  Medication: Hydrocodone/APAP Pill/Patch Count:  48 of 90 pills remain Pill/Patch Appearance: Markings consistent with prescribed medication Bottle Appearance: Standard pharmacy container. Clearly labeled. Filled Date: 17 / 14 / 2023 Last Medication intake:  Today

## 2022-03-14 NOTE — Progress Notes (Signed)
PROVIDER NOTE: Information contained herein reflects review and annotations entered in association with encounter. Interpretation of such information and data should be left to medically-trained personnel. Information provided to patient can be located elsewhere in the medical record under "Patient Instructions". Document created using STT-dictation technology, any transcriptional errors that may result from process are unintentional.    Patient: [name]  Service Category: E/M  Provider: Gillis Santa, MD  DOB: 1960-10-30  DOS: 03/14/2022  Specialty: Interventional Pain Management  MRN: [id number]  Setting: Ambulatory outpatient  PCP: Margarita Rana, MD  Type: Established Patient    Referring Provider: Margarita Rana, MD  Location: Office  Delivery: Face-to-face     HPI  [name], a 61 y.o. year old female, is here today because of her No primary diagnosis found.. [name]'s primary complain today is Back Pain and Knee Pain  Last encounter: My last encounter with her was on 02/05/2022 Pertinent problems: [name] has Lumbar radiculopathy; Chronic bilateral low back pain with bilateral sciatica; Chronic pain syndrome; Chronic, continuous use of opioids; Chronic myofascial pain; Primary osteoarthritis of both hips; Type 2 diabetes mellitus without complications ([name]); and Pain in both hands on their pertinent problem list. Pain Assessment: Severity of Chronic pain is reported as a 10-Worst pain ever/10. Location: Other (Comment) (back and bilat knee)  / . Onset: More than a month ago. Quality: Aching, Burning, Sharp, Shooting. Timing: Constant. Modifying factor(s): meds. Vitals:  height is _0  (1.499 m) and weight is 104 lb (47.2 kg). Her temporal temperature is 97.1 F (36.2 C) (abnormal). Her blood pressure is 115/74 and her pulse is 109 (abnormal). Her respiration is 17 and oxygen saturation is 92%.   Reason for encounter:   Patient continues to  endorse benefit after Monovisc injection done in bilateral knees in October.  Will continue to monitor. Otherwise, no changes in medical history.  Continues Lyrica and hydrocodone as prescribed.  Utilizes Linzess for opioid-induced constipation.  Otherwise no side effects or changes in her medical history since her last visit. She is also experiencing unintentional weight loss.  She states that she has an appointment with her primary care provider to discuss this further and January. Finds benefit with Lyrica 100 mg 3 times a day.  Creatinine function within normal limits. Requesting refill of Topamax 25 mg twice daily which she takes for migraine management. UDS up-to-date and appropriate   Pharmacotherapy Assessment  Analgesic: Norco 7.5 mg TID PRN #90/month   Monitoring: [place] PMP: PDMP reviewed during this encounter.       Pharmacotherapy: No side-effects or adverse reactions reported. Compliance: No problems identified. Effectiveness: Clinically acceptable.  UDS:  Summary  Date Value Ref Range Status  12/13/2021 Note  Final    Comment:    ==================================================================== [employer] [facility] 13 (MW) ==================================================================== Test                             Result       Flag       Units  Drug Present and Declared for Prescription Verification   Hydrocodone                    4177         EXPECTED   ng/mg creat   Hydromorphone                  816  EXPECTED   ng/mg creat   Dihydrocodeine                 338          EXPECTED   ng/mg creat   Norhydrocodone                 6097         EXPECTED   ng/mg creat    Sources of hydrocodone include scheduled prescription medications.    Hydromorphone, dihydrocodeine and norhydrocodone are expected    metabolites of hydrocodone. Hydromorphone and dihydrocodeine are    also available as scheduled prescription  medications.  ==================================================================== Test                      Result    Flag   Units      Ref Range   Creatinine              77               mg/dL      >=20 ==================================================================== Declared Medications:  The flagging and interpretation on this report are based on the  following declared medications.  Unexpected results may arise from  inaccuracies in the declared medications.   **Note: The testing scope of this panel includes these medications:   Hydrocodone (Norco)   **Note: The testing scope of this panel does not include the  following reported medications:   Acetaminophen (Norco)  Albuterol (Ventolin HFA)  Aripiprazole (Abilify)  Aspirin  Budesonide (Symbicort)  Cetirizine (Zyrtec)  Famotidine (Pepcid)  Fluticasone (Flonase)  Formoterol (Symbicort)  Insulin (NovoLog)  Iron  Lidocaine  Linaclotide (Linzess)  Losartan (Cozaar)  Meclizine (Antivert)  Metformin  Mirtazapine (Remeron)  Montelukast (Singulair)  Multivitamin  Nicotine  Ondansetron (Zofran)  Pregabalin (Lyrica)  Propranolol (Inderal)  Rizatriptan (Maxalt)  Rosuvastatin (Crestor)  Solifenacin (Vesicare)  Tiotropium (Spiriva)  Topiramate (Topamax)  Trazodone (Desyrel)  Venlafaxine (Effexor)  Vitamin D2 ==================================================================== For clinical consultation, please call [phone]. ====================================================================         ROS  Constitutional: Denies any fever or chills Gastrointestinal: No reported hemesis, hematochezia, vomiting, or acute GI distress Musculoskeletal:  Low back pain Neurological: No reported episodes of acute onset apraxia, aphasia, dysarthria, agnosia, amnesia, paralysis, loss of coordination, or loss of consciousness  Medication Review  ARIPiprazole, HYDROcodone-acetaminophen, Insulin Pen Needle,  albuterol, aspirin, budesonide-formoterol, cetirizine, famotidine, fluticasone, glucose blood, insulin aspart protamine - aspart, lidocaine, linaclotide, losartan, meclizine, metFORMIN, mirtazapine, montelukast, ondansetron, pregabalin, propranolol, rizatriptan, rosuvastatin, solifenacin, tiotropium, topiramate, traZODone, and venlafaxine XR  History Review  Allergy: [name] is allergic to tramadol, benadryl [diphenhydramine hcl], bextra [valdecoxib], invokana [canagliflozin], and sulfamethoxazole-trimethoprim. Drug: [name]  reports that she does not currently use drugs. Alcohol:  reports current alcohol use of about 2.0 standard drinks of alcohol per week. Tobacco:  reports that she has been smoking cigarettes. She has a 7.00 pack-year smoking history. She has been exposed to tobacco smoke. She has quit using smokeless tobacco. Social: [name]  reports that she has been smoking cigarettes. She has a 7.00 pack-year smoking history. She has been exposed to tobacco smoke. She has quit using smokeless tobacco. She reports current alcohol use of about 2.0 standard drinks of alcohol per week. She reports that she does not currently use drugs. Medical:  has a past medical history of Abnormal CBC, Asthma, Asthma, BPPV (benign paroxysmal positional vertigo), Cardiac murmur, unspecified (08/28/2016), COPD (chronic obstructive pulmonary disease) ([place]),  CPAP (continuous positive airway pressure) dependence, Degenerative disc disease, lumbar, Depression, Diabetes mellitus, Diverticulitis (10/2016), Elevated LFTs, Fatty liver, Fibromyalgia, Fibromyalgia, GERD (gastroesophageal reflux disease), Headache, History of degenerative disc disease, Hyperlipidemia, Hypertension, and Sleep apnea. Surgical: [name]  has a past surgical history that includes Abdominal surgery; Abdominal hysterectomy; Esophagogastroduodenoscopy (egd) with propofol (N/A, 02/20/2017); Colonoscopy with propofol (N/A,  02/20/2017); Upper esophageal endoscopic ultrasound (eus) (N/A, 04/03/2017); Diverticulitis; EUS (N/A, 01/08/2018); Cholecystectomy (N/A, 04/27/2018); Cataract extraction w/PHACO (Left, 09/07/2019); Cataract extraction w/PHACO (Right, 09/28/2019); and RIGHT/LEFT HEART CATH AND CORONARY ANGIOGRAPHY (Bilateral, 12/19/2021). Family: family history includes Diabetes in her mother; Hypertension in her mother.  Laboratory Chemistry Profile   Renal Lab Results  Component Value Date   BUN 9 07/11/2021   CREATININE 0.81 07/11/2021   BCR 14 11/27/2017   GFRAA >60 08/09/2019   GFRNONAA >60 07/11/2021     Hepatic Lab Results  Component Value Date   AST 14 (L) 07/11/2021   ALT 30 07/11/2021   ALBUMIN 3.9 07/11/2021   ALKPHOS 117 07/11/2021   LIPASE 36 07/11/2021   AMMONIA 46 (H) 04/05/2021     Electrolytes Lab Results  Component Value Date   NA 137 07/11/2021   K 4.0 07/11/2021   CL 103 07/11/2021   CALCIUM 9.6 07/11/2021   MG 1.7 04/05/2021     Bone No results found for: "VD25OH", "VD125OH2TOT", "TD3220UR4", "YH0623JS2", "25OHVITD1", "25OHVITD2", "83TDVVOH6", "TESTOFREE", "TESTOSTERONE"   Inflammation (CRP: Acute Phase) (ESR: Chronic Phase) Lab Results  Component Value Date   CRP <1 11/27/2017   ESRSEDRATE 17 11/27/2017   LATICACIDVEN 1.3 04/05/2021       Note: Above Lab results reviewed.  Recent Imaging Review  CARDIAC CATHETERIZATION   The left ventricular systolic function is normal.   LV end diastolic pressure is normal.   The left ventricular ejection fraction is 55-65% by visual estimate.   Hemodynamic findings consistent with mild pulmonary hypertension.   There is no mitral valve regurgitation.  Conclusion Right brachial right radial approach/ right left heart cath Right heart cath with mild pulmonary hypertension PA mean of 32 wedge of  17 Cardiac output of 6.0 Fick  Left heart cath Normal left ventricular function at 60% Coronaries Normal coronaries left  dominant system No complications  Note: Reviewed        Physical Exam  General appearance: Well nourished, well developed, and well hydrated. In no apparent acute distress Mental status: Alert, oriented x 3 (person, place, & time)       Respiratory: No evidence of acute respiratory distress Eyes: PERLA Vitals: BP 115/74   Pulse (!) 109   Temp (!) 97.1 F (36.2 C) (Temporal)   Resp 17   Ht _0  (1.499 m)   Wt 104 lb (47.2 kg)   LMP  (LMP Unknown) Comment: "many years"  SpO2 92%   BMI 21.01 kg/m  BMI: Estimated body mass index is 21.01 kg/m as calculated from the following:   Height as of this encounter: _1  (1.499 m).   Weight as of this encounter: 104 lb (47.2 kg). Ideal: Patient must be at least 60 in tall to calculate ideal body weight    Assessment   Status Diagnosis  Persistent Persistent Persistent 1. Lumbar radiculopathy   2. Chronic pain syndrome   3. Chronic myofascial pain   4. Arthritis of left knee   5. Primary osteoarthritis of right knee   6. Chronic, continuous use of opioids       Plan of Care  [name] has a current medication list which includes the following long-term medication(s): albuterol, aripiprazole, budesonide-formoterol, famotidine, insulin aspart protamine - aspart, losartan, metformin, mirtazapine, propranolol, rizatriptan, trazodone, venlafaxine xr, pregabalin, and topiramate.  1. Lumbar radiculopathy - pregabalin (LYRICA) 100 MG capsule; Take 1 capsule (100 mg total) by mouth 3 (three) times daily.  Dispense: 90 capsule; Refill: 5 - HYDROcodone-acetaminophen (NORCO) 7.5-325 MG tablet; Take 1 tablet by mouth 3 (three) times daily as needed for severe pain. Must last 30 days  Dispense: 90 tablet; Refill: 0 - HYDROcodone-acetaminophen (NORCO) 7.5-325 MG tablet; Take 1 tablet by mouth 3 (three) times daily as needed for severe pain. Must last 30 days  Dispense: 90 tablet; Refill: 0 - HYDROcodone-acetaminophen (NORCO)  7.5-325 MG tablet; Take 1 tablet by mouth 3 (three) times daily as needed for severe pain. Must last 30 days  Dispense: 90 tablet; Refill: 0  2. Chronic pain syndrome - pregabalin (LYRICA) 100 MG capsule; Take 1 capsule (100 mg total) by mouth 3 (three) times daily.  Dispense: 90 capsule; Refill: 5 - HYDROcodone-acetaminophen (NORCO) 7.5-325 MG tablet; Take 1 tablet by mouth 3 (three) times daily as needed for severe pain. Must last 30 days  Dispense: 90 tablet; Refill: 0 - HYDROcodone-acetaminophen (NORCO) 7.5-325 MG tablet; Take 1 tablet by mouth 3 (three) times daily as needed for severe pain. Must last 30 days  Dispense: 90 tablet; Refill: 0 - HYDROcodone-acetaminophen (NORCO) 7.5-325 MG tablet; Take 1 tablet by mouth 3 (three) times daily as needed for severe pain. Must last 30 days  Dispense: 90 tablet; Refill: 0  3. Chronic myofascial pain - pregabalin (LYRICA) 100 MG capsule; Take 1 capsule (100 mg total) by mouth 3 (three) times daily.  Dispense: 90 capsule; Refill: 5 - HYDROcodone-acetaminophen (NORCO) 7.5-325 MG tablet; Take 1 tablet by mouth 3 (three) times daily as needed for severe pain. Must last 30 days  Dispense: 90 tablet; Refill: 0 - HYDROcodone-acetaminophen (NORCO) 7.5-325 MG tablet; Take 1 tablet by mouth 3 (three) times daily as needed for severe pain. Must last 30 days  Dispense: 90 tablet; Refill: 0 - HYDROcodone-acetaminophen (NORCO) 7.5-325 MG tablet; Take 1 tablet by mouth 3 (three) times daily as needed for severe pain. Must last 30 days  Dispense: 90 tablet; Refill: 0  4. Arthritis of left knee - HYDROcodone-acetaminophen (NORCO) 7.5-325 MG tablet; Take 1 tablet by mouth 3 (three) times daily as needed for severe pain. Must last 30 days  Dispense: 90 tablet; Refill: 0 - HYDROcodone-acetaminophen (NORCO) 7.5-325 MG tablet; Take 1 tablet by mouth 3 (three) times daily as needed for severe pain. Must last 30 days  Dispense: 90 tablet; Refill: 0 - HYDROcodone-acetaminophen  (NORCO) 7.5-325 MG tablet; Take 1 tablet by mouth 3 (three) times daily as needed for severe pain. Must last 30 days  Dispense: 90 tablet; Refill: 0  5. Primary osteoarthritis of right knee - HYDROcodone-acetaminophen (NORCO) 7.5-325 MG tablet; Take 1 tablet by mouth 3 (three) times daily as needed for severe pain. Must last 30 days  Dispense: 90 tablet; Refill: 0 - HYDROcodone-acetaminophen (NORCO) 7.5-325 MG tablet; Take 1 tablet by mouth 3 (three) times daily as needed for severe pain. Must last 30 days  Dispense: 90 tablet; Refill: 0 - HYDROcodone-acetaminophen (NORCO) 7.5-325 MG tablet; Take 1 tablet by mouth 3 (three) times daily as needed for severe pain. Must last 30 days  Dispense: 90 tablet; Refill: 0  6. Chronic, continuous use of opioids - HYDROcodone-acetaminophen (NORCO) 7.5-325 MG tablet; Take  1 tablet by mouth 3 (three) times daily as needed for severe pain. Must last 30 days  Dispense: 90 tablet; Refill: 0 - HYDROcodone-acetaminophen (NORCO) 7.5-325 MG tablet; Take 1 tablet by mouth 3 (three) times daily as needed for severe pain. Must last 30 days  Dispense: 90 tablet; Refill: 0 - HYDROcodone-acetaminophen (NORCO) 7.5-325 MG tablet; Take 1 tablet by mouth 3 (three) times daily as needed for severe pain. Must last 30 days  Dispense: 90 tablet; Refill: 0   Pharmacotherapy (Medications Ordered): Meds ordered this encounter  Medications   topiramate (TOPAMAX) 25 MG tablet    Sig: Take 1 tablet (25 mg total) by mouth 2 (two) times daily.    Dispense:  60 tablet    Refill:  5   pregabalin (LYRICA) 100 MG capsule    Sig: Take 1 capsule (100 mg total) by mouth 3 (three) times daily.    Dispense:  90 capsule    Refill:  5   HYDROcodone-acetaminophen (NORCO) 7.5-325 MG tablet    Sig: Take 1 tablet by mouth 3 (three) times daily as needed for severe pain. Must last 30 days    Dispense:  90 tablet    Refill:  0    Chronic Pain: STOP Act (Not applicable) Fill 1 day early if closed on  refill date. Avoid benzodiazepines within 8 hours of opioids   HYDROcodone-acetaminophen (NORCO) 7.5-325 MG tablet    Sig: Take 1 tablet by mouth 3 (three) times daily as needed for severe pain. Must last 30 days    Dispense:  90 tablet    Refill:  0    Chronic Pain: STOP Act (Not applicable) Fill 1 day early if closed on refill date. Avoid benzodiazepines within 8 hours of opioids   HYDROcodone-acetaminophen (NORCO) 7.5-325 MG tablet    Sig: Take 1 tablet by mouth 3 (three) times daily as needed for severe pain. Must last 30 days    Dispense:  90 tablet    Refill:  0    Chronic Pain: STOP Act (Not applicable) Fill 1 day early if closed on refill date. Avoid benzodiazepines within 8 hours of opioids    Follow-up plan:   Return in about 14 weeks (around 06/20/2022) for Medication Management, in person.   Recent Visits Date Type Provider Dept  02/05/22 Office Visit Gillis Santa, MD [facility]  12/31/21 Procedure visit Gillis Santa, MD [facility]  Showing recent visits within past 90 days and meeting all other requirements Today's Visits Date Type Provider Dept  03/14/22 Office Visit Gillis Santa, MD [facility]  Showing today's visits and meeting all other requirements Future Appointments No visits were found meeting these conditions. Showing future appointments within next 90 days and meeting all other requirements  I discussed the assessment and treatment plan with the patient. The patient was provided an opportunity to ask questions and all were answered. The patient agreed with the plan and demonstrated an understanding of the instructions.  Patient advised to call back or seek an in-person evaluation if the symptoms or condition worsens.  Duration of encounter: 30 minutes.  Note by: Gillis Santa, MD Date: 03/14/2022; Time: 12:20 PM

## 2022-03-27 ENCOUNTER — Telehealth: Payer: Self-pay | Admitting: Student in an Organized Health Care Education/Training Program

## 2022-03-27 NOTE — Telephone Encounter (Signed)
done

## 2022-03-27 NOTE — Telephone Encounter (Signed)
Pharmacy told patient they need PA for medications.

## 2022-04-04 ENCOUNTER — Other Ambulatory Visit: Payer: Self-pay | Admitting: Family Medicine

## 2022-04-04 DIAGNOSIS — R1013 Epigastric pain: Secondary | ICD-10-CM

## 2022-04-10 ENCOUNTER — Ambulatory Visit: Payer: Medicaid Other

## 2022-04-12 ENCOUNTER — Ambulatory Visit
Admission: RE | Admit: 2022-04-12 | Discharge: 2022-04-12 | Disposition: A | Discharge status: Home / Self Care | Payer: Medicaid Other | Source: Ambulatory Visit | Attending: Family Medicine | Admitting: Family Medicine

## 2022-04-12 DIAGNOSIS — R1013 Epigastric pain: Secondary | ICD-10-CM | POA: Diagnosis present

## 2022-04-18 ENCOUNTER — Other Ambulatory Visit: Payer: Self-pay

## 2022-04-18 DIAGNOSIS — Z122 Encounter for screening for malignant neoplasm of respiratory organs: Secondary | ICD-10-CM

## 2022-04-18 DIAGNOSIS — F17210 Nicotine dependence, cigarettes, uncomplicated: Secondary | ICD-10-CM

## 2022-04-18 DIAGNOSIS — Z87891 Personal history of nicotine dependence: Secondary | ICD-10-CM

## 2022-04-19 ENCOUNTER — Other Ambulatory Visit: Payer: Self-pay

## 2022-04-19 ENCOUNTER — Emergency Department: Payer: Medicaid Other

## 2022-04-19 ENCOUNTER — Emergency Department
Admission: EM | Admit: 2022-04-19 | Discharge: 2022-04-19 | Disposition: A | Discharge status: Home / Self Care | Payer: Medicaid Other | Attending: Emergency Medicine | Admitting: Emergency Medicine

## 2022-04-19 DIAGNOSIS — J449 Chronic obstructive pulmonary disease, unspecified: Secondary | ICD-10-CM | POA: Diagnosis not present

## 2022-04-19 DIAGNOSIS — N3000 Acute cystitis without hematuria: Secondary | ICD-10-CM | POA: Diagnosis not present

## 2022-04-19 DIAGNOSIS — E119 Type 2 diabetes mellitus without complications: Secondary | ICD-10-CM | POA: Insufficient documentation

## 2022-04-19 DIAGNOSIS — I10 Essential (primary) hypertension: Secondary | ICD-10-CM | POA: Diagnosis not present

## 2022-04-19 DIAGNOSIS — R1084 Generalized abdominal pain: Secondary | ICD-10-CM

## 2022-04-19 DIAGNOSIS — R109 Unspecified abdominal pain: Secondary | ICD-10-CM | POA: Diagnosis present

## 2022-04-19 LAB — COMPREHENSIVE METABOLIC PANEL
ALT: 36 U/L (ref 0–44)
AST: 10 U/L — ABNORMAL LOW (ref 15–41)
Albumin: 3.9 g/dL (ref 3.5–5.0)
Alkaline Phosphatase: 138 U/L — ABNORMAL HIGH (ref 38–126)
Anion gap: 11 (ref 5–15)
BUN: 20 mg/dL (ref 8–23)
CO2: 20 mmol/L — ABNORMAL LOW (ref 22–32)
Calcium: 9.8 mg/dL (ref 8.9–10.3)
Chloride: 107 mmol/L (ref 98–111)
Creatinine, Ser: 0.52 mg/dL (ref 0.44–1.00)
GFR, Estimated: >60 mL/min (ref >60)
Glucose, Bld: 215 mg/dL — ABNORMAL HIGH (ref 70–99)
Potassium: 3.7 mmol/L (ref 3.5–5.1)
Sodium: 138 mmol/L (ref 135–145)
Total Bilirubin: 1 mg/dL (ref 0.3–1.2)
Total Protein: 7.2 g/dL (ref 6.5–8.1)

## 2022-04-19 LAB — CBC
HCT: 50.8 % — ABNORMAL HIGH (ref 36.0–46.0)
Hemoglobin: 16.1 g/dL — ABNORMAL HIGH (ref 12.0–15.0)
MCH: 27.3 pg (ref 26.0–34.0)
MCHC: 31.7 g/dL (ref 30.0–36.0)
MCV: 86.2 fL (ref 80.0–100.0)
Platelets: 197 10*3/uL (ref 150–400)
RBC: 5.89 MIL/uL — ABNORMAL HIGH (ref 3.87–5.11)
RDW: 14 % (ref 11.5–15.5)
WBC: 9.5 10*3/uL (ref 4.0–10.5)
nRBC: 0 % (ref 0.0–0.2)

## 2022-04-19 LAB — URINALYSIS, ROUTINE W REFLEX MICROSCOPIC
Bilirubin Urine: NEGATIVE
Glucose, UA: >=500 mg/dL — AB
Hgb urine dipstick: NEGATIVE
Ketones, ur: 5 mg/dL — AB
Leukocytes,Ua: NEGATIVE
Nitrite: NEGATIVE
Protein, ur: 100 mg/dL — AB
Specific Gravity, Urine: 1.025 (ref 1.005–1.030)
pH: 5 (ref 5.0–8.0)

## 2022-04-19 LAB — LIPASE, BLOOD: Lipase: 47 U/L (ref 11–51)

## 2022-04-19 LAB — MAGNESIUM: Magnesium: 1.4 mg/dL — ABNORMAL LOW (ref 1.7–2.4)

## 2022-04-19 MED ORDER — MAGNESIUM SULFATE 2 GM/50ML IV SOLN
2.0000 g | Freq: Once | INTRAVENOUS | Status: AC
  Administered 2022-04-19: 2 g via INTRAVENOUS
  Filled 2022-04-19: qty 50

## 2022-04-19 MED ORDER — IOHEXOL 300 MG/ML  SOLN
100.0000 mL | Freq: Once | INTRAMUSCULAR | Status: AC | PRN
  Administered 2022-04-19: 80 mL via INTRAVENOUS

## 2022-04-19 MED ORDER — SODIUM CHLORIDE 0.9 % IV BOLUS
1000.0000 mL | Freq: Once | INTRAVENOUS | Status: AC
  Administered 2022-04-19: 1000 mL via INTRAVENOUS

## 2022-04-19 MED ORDER — NITROFURANTOIN MONOHYD MACRO 100 MG PO CAPS: 100.0000 mg | ORAL_CAPSULE | Freq: Two times a day (BID) | ORAL | 0 refills | Status: AC

## 2022-04-19 MED ORDER — METOCLOPRAMIDE HCL 5 MG/ML IJ SOLN
10.0000 mg | Freq: Once | INTRAMUSCULAR | Status: AC
  Administered 2022-04-19: 10 mg via INTRAVENOUS
  Filled 2022-04-19: qty 2

## 2022-04-19 MED ORDER — NITROFURANTOIN MONOHYD MACRO 100 MG PO CAPS
100.0000 mg | ORAL_CAPSULE | Freq: Once | ORAL | Status: AC
  Administered 2022-04-19: 100 mg via ORAL
  Filled 2022-04-19: qty 1

## 2022-04-19 NOTE — ED Provider Notes (Signed)
[facility] Provider Note    Event Date/Time   First MD Initiated Contact with Patient 04/19/22 1323     (approximate)   History   Abdominal Pain   HPI  [name] is a 62 y.o. female   Past medical history of insulin-dependent diabetic, COPD, fibromyalgia, hypertension presents emergency department with abdominal pain, nausea but no vomiting, dysuria.  She was sent in by her primary doctor for concerns of dehydration and gastroparesis.  She has been having difficulty taking insulin because of weakness in her hands.   She denies chest pain, respiratory infectious symptoms, bleeding, other acute medical complaints.   External Medical Documents Reviewed: Primary care doctor note today with concerns of dehydration gastroparesis.      Physical Exam   Triage Vital Signs: ED Triage Vitals [04/19/22 1212]  Enc Vitals Group     BP 101/78     Pulse Rate 98     Resp 18     Temp 98.5 F (36.9 C)     Temp Source Oral     SpO2 96 %     Weight      Height      Head Circumference      Peak Flow      Pain Score 5     Pain Loc      Pain Edu?      Excl. in [place]?     Most recent vital signs: Vitals:   04/19/22 1212  BP: 101/78  Pulse: 98  Resp: 18  Temp: 98.5 F (36.9 C)  SpO2: 96%    General: Awake, no distress.  CV:  Good peripheral perfusion.  Resp:  Normal effort.  Abd:  No distention.  Other:  Mucous membranes appear dry, she appears slightly dehydrated, hemodynamics appropriate reassuring afebrile abdomen is soft and mild tenderness diffusely, no rebound or rigidity or guarding.  Moving all extremities motor or sensory exam is normal.  No facial asymmetry.  Nontoxic-appearing.   ED Results / Procedures / Treatments   Labs (all labs ordered are listed, but only abnormal results are displayed) Labs Reviewed  COMPREHENSIVE METABOLIC PANEL - Abnormal; Notable for the following components:      Result Value   CO2 20 (*)     Glucose, Bld 215 (*)    AST 10 (*)    Alkaline Phosphatase 138 (*)    All other components within normal limits  CBC - Abnormal; Notable for the following components:   RBC 5.89 (*)    Hemoglobin 16.1 (*)    HCT 50.8 (*)    All other components within normal limits  URINALYSIS, ROUTINE W REFLEX MICROSCOPIC - Abnormal; Notable for the following components:   Color, Urine YELLOW (*)    APPearance CLOUDY (*)    Glucose, UA >=500 (*)    Ketones, ur 5 (*)    Protein, ur 100 (*)    Bacteria, UA RARE (*)    All other components within normal limits  MAGNESIUM - Abnormal; Notable for the following components:   Magnesium 1.4 (*)    All other components within normal limits  LIPASE, BLOOD     I ordered and reviewed the above labs they are notable for hemoglobin of 16.1 elevated from prior probably from hemoconcentration.  EKG  ED ECG REPORT I, Lucillie Garfinkel, the attending physician, personally viewed and interpreted this ECG.   Date: 04/19/2022  EKG Time: 1216  Rate: 115  Rhythm: 1st degree av  block  Axis: RAD  Intervals: First-degree AV block  ST&T Change: No acute ischemic changes    RADIOLOGY I independently reviewed and interpreted cT scan of the abdomen pelvis to see no obvious infectious or obstructive patterns   PROCEDURES:  Critical Care performed: No  Procedures   MEDICATIONS ORDERED IN ED: Medications  nitrofurantoin (macrocrystal-monohydrate) (MACROBID) capsule 100 mg (has no administration in time range)  sodium chloride 0.9 % bolus 1,000 mL (1,000 mLs Intravenous New Bag/Given 04/19/22 1437)  metoCLOPramide (REGLAN) injection 10 mg (10 mg Intravenous Given 04/19/22 1436)  magnesium sulfate IVPB 2 g 50 mL (2 g Intravenous New Bag/Given 04/19/22 1438)  iohexol (OMNIPAQUE) 300 MG/ML solution 100 mL (80 mLs Intravenous Contrast Given 04/19/22 1439)    IMPRESSION / MDM / ASSESSMENT AND PLAN / ED COURSE  I reviewed the triage vital signs and the nursing notes.                                 Patient's presentation is most consistent with acute presentation with potential threat to life or bodily function.  Differential diagnosis includes, but is not limited to, surgical abdomen like obstruction, infection, gastroparesis, urinary tract infection, dehydration, DKA or electrolyte disturbance   The patient is on the cardiac monitor to evaluate for evidence of arrhythmia and/or significant heart rate changes.  MDM: Patient with negative CT scan for surgical abdominal pathologies, feels better after some rehydration electrolyte repletion including magnesium which was low.  Urinary tract infection with bacteria, symptoms of dysuria will treat with antibiotics.  She otherwise appears well, no DKA, feels better with some hydration.  I considered hospitalization for admission or observation for ongoing IV hydration but since she feels well and can tolerate p.o., plan for outpatient treatment with follow-up with PMD as most appropriate at this time.  Patient agreement.        FINAL CLINICAL IMPRESSION(S) / ED DIAGNOSES   Final diagnoses:  Generalized abdominal pain  Acute cystitis without hematuria     Rx / DC Orders   ED Discharge Orders          Ordered    nitrofurantoin, macrocrystal-monohydrate, (MACROBID) 100 MG capsule  2 times daily        04/19/22 1547             Note:  This document was prepared using Dragon voice recognition software and may include unintentional dictation errors.    Lucillie Garfinkel, MD 04/19/22 [phone]

## 2022-04-19 NOTE — ED Notes (Signed)
Pt ambulated independently to restroom

## 2022-04-19 NOTE — ED Notes (Signed)
Pt discharge to home. Pt VSS, GCS 15, NAD. Pt verbalized understanding of discharge instructions with no additional questions at this time.  

## 2022-04-19 NOTE — ED Triage Notes (Signed)
Pt to ED via POV from home. Pt sent by PCP for concerns of dehydration needing fluid resuscitation. Pt reports dizzy spells, abdominal pain, decreased appetite, gastroparesis and hypotension. Pt seen at [facility].

## 2022-04-19 NOTE — Discharge Instructions (Signed)
Take Antibiotics for urinary tract infection.  Continue taking your diabetes medicines as prescribed.  Stay well-hydrated by drinking plenty of fluids.   Thank you for choosing [name] for your health care today!  Please see your primary doctor this week for a follow up appointment.   Sometimes, in the early stages of certain disease courses it is difficult to detect in the emergency department evaluation -- so, it is important that you continue to monitor your symptoms and call your doctor right away or return to the emergency department if you develop any new or worsening symptoms.  Please go to the following website to schedule new (and existing) patient appointments:   [email or web address]  If you do not have a primary doctor try calling the following clinics to establish care:  If you have insurance:  [facility] [phone] [place] [address]   [facility]  [phone] [place] [place] [address]   If you do not have insurance:  [facility]  [phone] [address], [place] [address]   The following is another list of primary care offices in the area who are accepting new patients at this time.  Please reach out to one of them directly and let them know you would like to schedule an appointment to follow up on an Emergency Department visit, and/or to establish a new primary care provider (PCP).  There are likely other primary care clinics in the are who are accepting new patients, but this is an excellent place to start:  [place] physician: Dr Lavon Paganini [address] [place], [place] [address] [phone]  [facility] Lead Physician: Dr Steele Sizer [address], [place], [place] [address] [phone]  [place] Physician: Dr Park Liter [address] [place], [place] [address] [phone]  [facility] Lead Physician: Dr Dewaine Oats [place], [place], [place] [address] [phone]  [facility] [place] Physician: Dr Halina Maidens [address] [place] Broach [place], [place] [address] [phone]   It was my pleasure to care for you today.   Hoover Brunette Jacelyn Grip, MD

## 2022-04-19 NOTE — ED Notes (Signed)
Patient transported to CT 

## 2022-04-23 ENCOUNTER — Encounter: Payer: Self-pay | Admitting: Acute Care

## 2022-04-23 ENCOUNTER — Ambulatory Visit (INDEPENDENT_AMBULATORY_CARE_PROVIDER_SITE_OTHER): Payer: Medicaid Other | Admitting: Acute Care

## 2022-04-23 DIAGNOSIS — F17210 Nicotine dependence, cigarettes, uncomplicated: Secondary | ICD-10-CM

## 2022-04-23 NOTE — Progress Notes (Signed)
Virtual Visit via Telephone Note  I connected with Shaneese Tait Lampkins on 04/23/22 at  1:30 PM EST by telephone and verified that I am speaking with the correct person using two identifiers.  Location: Patient:  At home Provider:  [place], [place], [place], [address]    I discussed the limitations, risks, security and privacy concerns of performing an evaluation and management service by telephone and the availability of in person appointments. I also discussed with the patient that there may be a patient responsible charge related to this service. The patient expressed understanding and agreed to proceed.   Shared Decision Making Visit Lung Cancer Screening Program [id number])   Eligibility: Age 62 y.o. Pack Years Smoking History Calculation 36 pack year smoking history (# packs/per year x # years smoked) Recent History of coughing up blood  no Unexplained weight loss? no ( >Than 15 pounds within the last 6 months ) Prior History Lung / other cancer no (Diagnosis within the last 5 years already requiring surveillance chest CT Scans). Smoking Status Current Smoker Former Smokers: Years since quit:  NA  Quit Date:  NA  Visit Components: Discussion included one or more decision making aids. yes Discussion included risk/benefits of screening. yes Discussion included potential follow up diagnostic testing for abnormal scans. yes Discussion included meaning and risk of over diagnosis. yes Discussion included meaning and risk of False Positives. yes Discussion included meaning of total radiation exposure. yes  Counseling Included: Importance of adherence to annual lung cancer LDCT screening. yes Impact of comorbidities on ability to participate in the program. yes Ability and willingness to under diagnostic treatment. yes  Smoking Cessation Counseling: Current Smokers:  Discussed importance of smoking cessation. yes Information about tobacco cessation classes and  interventions provided to patient. yes Patient provided with "ticket" for LDCT Scan. yes Symptomatic Patient. no  Counseling NA Diagnosis Code: Tobacco Use Z72.0 Asymptomatic Patient yes  Counseling (Intermediate counseling: > three minutes counseling) [id number] Former Smokers:  Discussed the importance of maintaining cigarette abstinence. yes Diagnosis Code: Personal History of Nicotine Dependence. I96.789 Information about tobacco cessation classes and interventions provided to patient. Yes Patient provided with "ticket" for LDCT Scan.  NA Written Order for Lung Cancer Screening with LDCT placed in Epic. Yes (CT Chest Lung Cancer Screening Low Dose W/O CM) [id number] Z12.2-Screening of respiratory organs Z87.891-Personal history of nicotine dependence  I have spent 25 minutes of face to face/ virtual visit   time with  Ms. Kroboth discussing the risks and benefits of lung cancer screening. We viewed / discussed a power point together that explained in detail the above noted topics. We paused at intervals to allow for questions to be asked and answered to ensure understanding.We discussed that the single most powerful action that she can take to decrease her risk of developing lung cancer is to quit smoking. We discussed whether or not she is ready to commit to setting a quit date. We discussed options for tools to aid in quitting smoking including nicotine replacement therapy, non-nicotine medications, support groups, Quit Smart classes, and behavior modification. We discussed that often times setting smaller, more achievable goals, such as eliminating 1 cigarette a day for a week and then 2 cigarettes a day for a week can be helpful in slowly decreasing the number of cigarettes smoked. This allows for a sense of accomplishment as well as providing a clinical benefit. I provided  her  with smoking cessation  information  with contact information for community  resources, classes, free nicotine  replacement therapy, and access to mobile apps, text messaging, and on-line smoking cessation help. I have also provided  her  the office contact information in the event she needs to contact me, or the screening staff. We discussed the time and location of the scan, and that either Doroteo Glassman RN, Joella Prince, RN  or I will call / send a letter with the results within 24-72 hours of receiving them. The patient verbalized understanding of all of  the above and had no further questions upon leaving the office. They have my contact information in the event they have any further questions.  I spent 3 minutes counseling on smoking cessation and the health risks of continued tobacco abuse.  I explained to the patient that there has been a high incidence of coronary artery disease noted on these exams. I explained that this is a non-gated exam therefore degree or severity cannot be determined. This patient is on statin therapy. I have asked the patient to follow-up with their PCP regarding any incidental finding of coronary artery disease and management with diet or medication as their PCP  feels is clinically indicated. The patient verbalized understanding of the above and had no further questions upon completion of the visit.      Magdalen Spatz, NP 04/23/2022

## 2022-04-23 NOTE — Patient Instructions (Signed)
Thank you for participating in the [facility] Cancer Screening Program. It was our pleasure to meet you today. We will call you with the results of your scan within the next few days. Your scan will be assigned a Lung RADS category score by the physicians reading the scans.  This Lung RADS score determines follow up scanning.  See below for description of categories, and follow up screening recommendations. We will be in touch to schedule your follow up screening annually or based on recommendations of our providers. We will fax a copy of your scan results to your Primary Care Physician, or the physician who referred you to the program, to ensure they have the results. Please call the office if you have any questions or concerns regarding your scanning experience or results.  Our office number is [phone]. Please speak with Denise Phelps, RN. , or  Denise Buckner RN, They are  our Lung Cancer Screening RN.'s If They are unavailable when you call, Please leave a message on the voice mail. We will return your call at our earliest convenience.This voice mail is monitored several times a day.  Remember, if your scan is normal, we will scan you annually as long as you continue to meet the criteria for the program. (Age 50-80, Current smoker or smoker who has quit within the last 15 years). If you are a smoker, remember, quitting is the single most powerful action that you can take to decrease your risk of lung cancer and other pulmonary, breathing related problems. We know quitting is hard, and we are here to help.  Please let us know if there is anything we can do to help you meet your goal of quitting. If you are a former smoker, congratulations. We are proud of you! Remain smoke free! Remember you can refer friends or family members through the number above.  We will screen them to make sure they meet criteria for the program. Thank you for helping us take better care of you by  participating in Lung Screening.  You can receive free nicotine replacement therapy ( patches, gum or mints) by calling 1-800-QUIT NOW. Please call so we can get you on the path to becoming  a non-smoker. I know it is hard, but you can do this!  Lung RADS Categories:  Lung RADS 1: no nodules or definitely non-concerning nodules.  Recommendation is for a repeat annual scan in 12 months.  Lung RADS 2:  nodules that are non-concerning in appearance and behavior with a very low likelihood of becoming an active cancer. Recommendation is for a repeat annual scan in 12 months.  Lung RADS 3: nodules that are probably non-concerning , includes nodules with a low likelihood of becoming an active cancer.  Recommendation is for a 6-month repeat screening scan. Often noted after an upper respiratory illness. We will be in touch to make sure you have no questions, and to schedule your 6-month scan.  Lung RADS 4 A: nodules with concerning findings, recommendation is most often for a follow up scan in 3 months or additional testing based on our provider's assessment of the scan. We will be in touch to make sure you have no questions and to schedule the recommended 3 month follow up scan.  Lung RADS 4 B:  indicates findings that are concerning. We will be in touch with you to schedule additional diagnostic testing based on our provider's  assessment of the scan.  Other options for assistance in smoking cessation (   As covered by your insurance benefits)  Hypnosis for smoking cessation  [employer]. [phone]  Acupuncture for smoking cessation  [employer] [phone]

## 2022-04-26 ENCOUNTER — Ambulatory Visit: Payer: Medicaid Other | Admitting: Internal Medicine

## 2022-04-26 NOTE — Progress Notes (Deleted)
Name: [name]  MRN/ DOB: [id number], 07/30/1960   Age/ Sex: 62 y.o., female    PCP: Margarita Rana, MD   Reason for Endocrinology Evaluation: Type 2 Diabetes Mellitus     Date of Initial Endocrinology Visit: 04/26/2022     PATIENT IDENTIFIER: [name] is a 62 y.o. female with a past medical history of DM, HTN, OSA on CPAP, fatty liver. The patient presented for initial endocrinology clinic visit on 04/26/2022 for consultative assistance with her diabetes management.    HPI: [name] was    Diagnosed with DM in 2017 Prior Medications tried/Intolerance: Invokana- yeast infection  Currently checking blood sugars *** x / day,  before breakfast and ***.  Hypoglycemia episodes : ***               Symptoms: ***                 Frequency: ***/  Hemoglobin A1c has ranged from 8.0% in 2017, peaking at >15.5% in 2023. Patient required assistance for hypoglycemia:  Patient has required hospitalization within the last 1 year from hyper or hypoglycemia:   In terms of diet, the patient ***   HOME DIABETES REGIMEN: Metformin 1000 mg NovoLog mix 70/30     Statin: yes ACE-I/ARB: yes Prior Diabetic Education: {Yes/No:11203}   METER DOWNLOAD SUMMARY: Date range evaluated: *** Fingerstick Blood Glucose Tests = *** Average Number Tests/Day = *** Overall Mean FS Glucose = *** Standard Deviation = ***  BG Ranges: Low = *** High = ***   Hypoglycemic Events/30 Days: BG < 50 = *** Episodes of symptomatic severe hypoglycemia = ***   DIABETIC COMPLICATIONS: Microvascular complications:  *** Denies: CKD Last eye exam: Completed   Macrovascular complications:  CAD Denies: PVD, CVA   PAST HISTORY: Past Medical History:  Past Medical History:  Diagnosis Date   Abnormal CBC    Asthma    Asthma    BPPV (benign paroxysmal positional vertigo)    none recently   Cardiac murmur, unspecified 08/28/2016   COPD (chronic obstructive pulmonary  disease) (HCC)    CPAP (continuous positive airway pressure) dependence    Degenerative disc disease, lumbar    Depression    Diabetes mellitus    Diverticulitis 10/2016   Elevated LFTs    Fatty liver    Fibromyalgia    Fibromyalgia    GERD (gastroesophageal reflux disease)    Headache    migraines.  None since starting topamax   History of degenerative disc disease    Hyperlipidemia    Hypertension    Sleep apnea    CPAP   Past Surgical History:  Past Surgical History:  Procedure Laterality Date   ABDOMINAL HYSTERECTOMY     ABDOMINAL SURGERY     CATARACT EXTRACTION W/PHACO Left 09/07/2019   Procedure: CATARACT EXTRACTION PHACO AND INTRAOCULAR LENS PLACEMENT ([place]) LEFT DIABETIC;  Surgeon: Birder Robson, MD;  Location: [facility];  Service: Ophthalmology;  Laterality: Left;  4.01 0:43.8   CATARACT EXTRACTION W/PHACO Right 09/28/2019   Procedure: CATARACT EXTRACTION PHACO AND INTRAOCULAR LENS PLACEMENT ([facility]) RIGHT DIABETIC 4.16  00:43.3;  Surgeon: Birder Robson, MD;  Location: [facility];  Service: Ophthalmology;  Laterality: Right;  Diabetic - insulin and oral meds   CHOLECYSTECTOMY N/A 04/27/2018   Procedure: LAPAROSCOPIC CHOLECYSTECTOMY;  Surgeon: Herbert Pun, MD;  Location: [facility];  Service: General;  Laterality: N/A;   COLONOSCOPY WITH PROPOFOL N/A 02/20/2017   Procedure: COLONOSCOPY WITH PROPOFOL;  Surgeon: Lollie Sails, MD;  Location: [facility] ENDOSCOPY;  Service: Endoscopy;  Laterality: N/A;   Diverticulitis     ESOPHAGOGASTRODUODENOSCOPY (EGD) WITH PROPOFOL N/A 02/20/2017   Procedure: ESOPHAGOGASTRODUODENOSCOPY (EGD) WITH PROPOFOL;  Surgeon: Lollie Sails, MD;  Location: [facility] ENDOSCOPY;  Service: Endoscopy;  Laterality: N/A;   EUS N/A 01/08/2018   Procedure: UPPER ENDOSCOPIC ULTRASOUND (EUS) RADIAL;  Surgeon: Holly Bodily, MD;  Location[facility]COPY;  Service: Gastroenterology;  Laterality: N/A;   RIGHT/LEFT HEART CATH  AND CORONARY ANGIOGRAPHY Bilateral 12/19/2021   Procedure: RIGHT/LEFT HEART CATH AND CORONARY ANGIOGRAPHY;  Surgeon: Yolonda Kida, MD;  Location: [facility];  Service: Cardiovascular;  Laterality: Bilateral;   UPPER ESOPHAGEAL ENDOSCOPIC ULTRASOUND (EUS) N/A 04/03/2017   Procedure: UPPER ESOPHAGEAL ENDOSCOPIC ULTRASOUND (EUS);  Surgeon: Holly Bodily, MD;  [facility] END[facility];  Service: Gastroenterology;  Laterality: N/A;    Social History:  reports that she has been smoking cigarettes. She has a 36.00 pack-year smoking history. She has been exposed to tobacco smoke. She has quit using smokeless tobacco. She reports current alcohol use of about 2.0 standard drinks of alcohol per week. She reports that she does not currently use drugs. Family History:  Family History  Problem Relation Age of Onset   Diabetes Mother    Hypertension Mother      HOME MEDICATIONS: Allergies as of 04/26/2022       Reactions   Tramadol Itching   Headaches and confusion.   Benadryl [diphenhydramine Hcl] Itching   Bextra [valdecoxib] Itching   Invokana [canagliflozin] Other (See Comments)   Causes yeast infection   Sulfamethoxazole-trimethoprim Other (See Comments)   "Makes me feel weird"        Medication List        Accurate as of April 26, 2022  6:55 AM. If you have any questions, ask your nurse or doctor.          Accu-Chek Guide test strip Generic drug: glucose blood USE TID   albuterol 108 (90 Base) MCG/ACT inhaler Commonly known as: VENTOLIN HFA Inhale 1 puff into the lungs 2 (two) times daily as needed.   ARIPiprazole 5 MG tablet Commonly known as: ABILIFY Take 5 mg by mouth daily.   aspirin 81 MG tablet Take 81 mg by mouth daily.   budesonide-formoterol 160-4.5 MCG/ACT inhaler Commonly known as: SYMBICORT Inhale 2 puffs into the lungs 2 (two) times daily.   cetirizine 10 MG tablet Commonly known as: ZYRTEC Take 10 mg by mouth at bedtime.    famotidine 40 MG tablet Commonly known as: PEPCID Take 20 mg by mouth daily.   fluticasone 50 MCG/ACT nasal spray Commonly known as: FLONASE Place 2 sprays into both nostrils daily.   HYDROcodone-acetaminophen 7.5-325 MG tablet Commonly known as: Norco Take 1 tablet by mouth 3 (three) times daily as needed for severe pain. Must last 30 days   HYDROcodone-acetaminophen 7.5-325 MG tablet Commonly known as: Norco Take 1 tablet by mouth 3 (three) times daily as needed for severe pain. Must last 30 days   HYDROcodone-acetaminophen 7.5-325 MG tablet Commonly known as: Norco Take 1 tablet by mouth 3 (three) times daily as needed for severe pain. Must last 30 days Start taking on: May 26, 2022   insulin aspart protamine - aspart (70-30) 100 UNIT/ML FlexPen Commonly known as: NOVOLOG 70/30 MIX Inject 30 Units into the skin 2 (two) times daily with a meal.   Insulin Pen Needle 31G X 5 MM Misc 1 Units  by Does not apply route in the morning and at bedtime.   lidocaine 5 % Commonly known as: LIDODERM Place 1 patch onto the skin daily. Remove & Discard patch within 12 hours or as directed by MD   Linzess 290 MCG Caps capsule Generic drug: linaclotide Take 290 mcg by mouth daily.   losartan 100 MG tablet Commonly known as: COZAAR Take 50 mg by mouth daily.   meclizine 25 MG tablet Commonly known as: ANTIVERT Take 25 mg by mouth 2 (two) times daily as needed for dizziness or nausea.   metFORMIN 1000 MG tablet Commonly known as: GLUCOPHAGE Take 1,000 mg by mouth 2 (two) times daily.   mirtazapine 45 MG tablet Commonly known as: REMERON Take 45 mg by mouth at bedtime.   montelukast 10 MG tablet Commonly known as: SINGULAIR Take 1 tablet by mouth daily.   nitrofurantoin (macrocrystal-monohydrate) 100 MG capsule Commonly known as: Macrobid Take 1 capsule (100 mg total) by mouth 2 (two) times daily for 7 days.   ondansetron 4 MG tablet Commonly known as: ZOFRAN Take 4 mg  by mouth every 8 (eight) hours as needed for nausea or vomiting.   pregabalin 100 MG capsule Commonly known as: LYRICA Take 1 capsule (100 mg total) by mouth 3 (three) times daily.   propranolol 40 MG tablet Commonly known as: INDERAL Take 40 mg by mouth 2 (two) times daily.   rizatriptan 10 MG tablet Commonly known as: MAXALT Take 10 mg by mouth as needed for migraine. May repeat in 2 hours if needed   rosuvastatin 20 MG tablet Commonly known as: CRESTOR Take 20 mg by mouth daily.   solifenacin 10 MG tablet Commonly known as: VESICARE Take 1 tablet by mouth daily.   tiotropium 18 MCG inhalation capsule Commonly known as: [place] into inhaler and inhale.   topiramate 25 MG tablet Commonly known as: TOPAMAX Take 1 tablet (25 mg total) by mouth 2 (two) times daily.   traZODone 150 MG tablet Commonly known as: DESYREL Take 150 mg by mouth at bedtime.   venlafaxine XR 150 MG 24 hr capsule Commonly known as: EFFEXOR-XR Take 150 mg by mouth daily.         ALLERGIES: Allergies  Allergen Reactions   Tramadol Itching    Headaches and confusion.   Benadryl [Diphenhydramine Hcl] Itching   Bextra [Valdecoxib] Itching   Invokana [Canagliflozin] Other (See Comments)    Causes yeast infection   Sulfamethoxazole-Trimethoprim Other (See Comments)    "Makes me feel weird"     REVIEW OF SYSTEMS: A comprehensive ROS was conducted with the patient and is negative except as per HPI     OBJECTIVE:   VITAL SIGNS: LMP  (LMP Unknown) Comment: "many years"   PHYSICAL EXAM:  General: Pt appears well and is in NAD  Neck: General: Supple without adenopathy or carotid bruits. Thyroid: Thyroid size normal.  No goiter or nodules appreciated.   Lungs: Clear with good BS bilat with no rales, rhonchi, or wheezes  Heart: RRR   Abdomen:  soft, nontender  Extremities:  Lower extremities - No pretibial edema. No lesions.  Neuro: MS is good with appropriate affect, pt is alert  and Ox3    DM foot exam:    DATA REVIEWED:  Lab Results  Component Value Date   HGBA1C >15.5 (H) 04/05/2021   HGBA1C 10.4 (H) 05/02/2016   HGBA1C 9.8 (H) 12/21/2015    Latest Reference Range & Units 04/19/22 12:13  Sodium 135 -  145 mmol/L 138  Potassium 3.5 - 5.1 mmol/L 3.7  Chloride 98 - 111 mmol/L 107  CO2 22 - 32 mmol/L 20 (L)  Glucose 70 - 99 mg/dL 215 (H)  BUN 8 - 23 mg/dL 20  Creatinine 0.44 - 1.00 mg/dL 0.52  Calcium 8.9 - 10.3 mg/dL 9.8  Anion gap 5 - 15  11  Magnesium 1.7 - 2.4 mg/dL 1.4 (L)  Alkaline Phosphatase 38 - 126 U/L 138 (H)  Albumin 3.5 - 5.0 g/dL 3.9  Lipase 11 - 51 U/L 47  AST 15 - 41 U/L 10 (L)  ALT 0 - 44 U/L 36  Total Protein 6.5 - 8.1 g/dL 7.2  Total Bilirubin 0.3 - 1.2 mg/dL 1.0  GFR, Estimated >60 mL/min >60  (L): Data is abnormally low (H): Data is abnormally high   ASSESSMENT / PLAN / RECOMMENDATIONS:   1) Type 2 Diabetes Mellitus, ***controlled, With macrovascular  complications - Most recent A1c of *** %. Goal A1c < 7.0 %.    Plan: GENERAL: ***  MEDICATIONS: ***  EDUCATION / INSTRUCTIONS: BG monitoring instructions: Patient is instructed to check her blood sugars *** times a day, ***. Call [facility] if: BG persistently < 70  I reviewed the Rule of 15 for the treatment of hypoglycemia in detail with the patient. Literature supplied.   2) Diabetic complications:  Eye: Does *** have known diabetic retinopathy.  Neuro/ Feet: Does *** have known diabetic peripheral neuropathy. Renal: Patient does *** have known baseline CKD. She is *** on an ACEI/ARB at present.  3) Lipids: Patient is *** on a statin.       Signed electronically by: Mack Guise, MD  [facility] Endocrinology  [facility] [address] [place] [place], [place] [address] Phone: [phone] FAX: [phone]   CC: Margarita Rana, [place] [address] Phone: [phone]  Fax:  [phone]    Return to Endocrinology clinic as below: Future Appointments  Date Time Provider [place]  04/26/2022  8:10 AM Demaria Deeney, Melanie Crazier, MD [facility] None  05/03/2022  9:30 AM [facility]  06/13/2022  8:40 AM Gillis Santa, MD [facility] None

## 2022-05-03 ENCOUNTER — Ambulatory Visit
Admission: RE | Admit: 2022-05-03 | Discharge: 2022-05-03 | Disposition: A | Discharge status: Home / Self Care | Payer: Medicaid Other | Source: Ambulatory Visit | Attending: Acute Care | Admitting: Acute Care

## 2022-05-03 DIAGNOSIS — J439 Emphysema, unspecified: Secondary | ICD-10-CM | POA: Diagnosis not present

## 2022-05-03 DIAGNOSIS — Z87891 Personal history of nicotine dependence: Secondary | ICD-10-CM

## 2022-05-03 DIAGNOSIS — F17210 Nicotine dependence, cigarettes, uncomplicated: Secondary | ICD-10-CM | POA: Insufficient documentation

## 2022-05-03 DIAGNOSIS — Z122 Encounter for screening for malignant neoplasm of respiratory organs: Secondary | ICD-10-CM | POA: Insufficient documentation

## 2022-05-03 DIAGNOSIS — I700 Atherosclerosis of aorta: Secondary | ICD-10-CM | POA: Diagnosis not present

## 2022-05-03 DIAGNOSIS — I2510 Atherosclerotic heart disease of native coronary artery without angina pectoris: Secondary | ICD-10-CM | POA: Diagnosis not present

## 2022-05-06 ENCOUNTER — Other Ambulatory Visit: Payer: Self-pay

## 2022-05-06 DIAGNOSIS — Z87891 Personal history of nicotine dependence: Secondary | ICD-10-CM

## 2022-05-06 DIAGNOSIS — F17210 Nicotine dependence, cigarettes, uncomplicated: Secondary | ICD-10-CM

## 2022-06-13 ENCOUNTER — Ambulatory Visit
Admission: RE | Admit: 2022-06-13 | Discharge: 2022-06-13 | Disposition: A | Discharge status: Home / Self Care | Payer: Medicaid Other | Source: Ambulatory Visit | Attending: Student in an Organized Health Care Education/Training Program | Admitting: Student in an Organized Health Care Education/Training Program

## 2022-06-13 ENCOUNTER — Ambulatory Visit (HOSPITAL_BASED_OUTPATIENT_CLINIC_OR_DEPARTMENT_OTHER): Payer: Medicaid Other | Admitting: Student in an Organized Health Care Education/Training Program

## 2022-06-13 ENCOUNTER — Encounter: Payer: Self-pay | Admitting: Student in an Organized Health Care Education/Training Program

## 2022-06-13 VITALS — BP 115/75 | HR 106 | Temp 97.2°F | Resp 16 | Ht 59.0 in | Wt 102.0 lb

## 2022-06-13 DIAGNOSIS — E1140 Type 2 diabetes mellitus with diabetic neuropathy, unspecified: Secondary | ICD-10-CM

## 2022-06-13 DIAGNOSIS — M1712 Unilateral primary osteoarthritis, left knee: Secondary | ICD-10-CM | POA: Insufficient documentation

## 2022-06-13 DIAGNOSIS — F1190 Opioid use, unspecified, uncomplicated: Secondary | ICD-10-CM | POA: Insufficient documentation

## 2022-06-13 DIAGNOSIS — M19012 Primary osteoarthritis, left shoulder: Secondary | ICD-10-CM | POA: Insufficient documentation

## 2022-06-13 DIAGNOSIS — M25512 Pain in left shoulder: Secondary | ICD-10-CM | POA: Insufficient documentation

## 2022-06-13 DIAGNOSIS — M5416 Radiculopathy, lumbar region: Secondary | ICD-10-CM

## 2022-06-13 DIAGNOSIS — M1711 Unilateral primary osteoarthritis, right knee: Secondary | ICD-10-CM

## 2022-06-13 DIAGNOSIS — Z794 Long term (current) use of insulin: Secondary | ICD-10-CM

## 2022-06-13 DIAGNOSIS — G8929 Other chronic pain: Secondary | ICD-10-CM

## 2022-06-13 DIAGNOSIS — G894 Chronic pain syndrome: Secondary | ICD-10-CM | POA: Insufficient documentation

## 2022-06-13 DIAGNOSIS — Z7984 Long term (current) use of oral hypoglycemic drugs: Secondary | ICD-10-CM

## 2022-06-13 DIAGNOSIS — M7918 Myalgia, other site: Secondary | ICD-10-CM

## 2022-06-13 MED ORDER — HYDROCODONE-ACETAMINOPHEN 7.5-325 MG PO TABS: 1.0000 | ORAL_TABLET | Freq: Three times a day (TID) | ORAL | 0 refills | Status: AC | PRN

## 2022-06-13 MED ORDER — HYDROCODONE-ACETAMINOPHEN 7.5-325 MG PO TABS: 1.0000 | ORAL_TABLET | Freq: Three times a day (TID) | ORAL | 0 refills | Status: DC | PRN

## 2022-06-13 MED ORDER — PREGABALIN 100 MG PO CAPS: 100.0000 mg | ORAL_CAPSULE | Freq: Three times a day (TID) | ORAL | 5 refills | Status: DC

## 2022-06-13 NOTE — Progress Notes (Signed)
PROVIDER NOTE: Information contained herein reflects review and annotations entered in association with encounter. Interpretation of such information and data should be left to medically-trained personnel. Information provided to patient can be located elsewhere in the medical record under "Patient Instructions". Document created using STT-dictation technology, any transcriptional errors that may result from process are unintentional.    Patient: [name]  Service Category: E/M  Provider: Gillis Santa, MD  DOB: 01-29-1961  DOS: 06/13/2022  Specialty: Interventional Pain Management  MRN: [id number]  Setting: Ambulatory outpatient  PCP: Margarita Rana, MD  Type: Established Patient    Referring Provider: Margarita Rana, MD  Location: Office  Delivery: Face-to-face     HPI  [name], a 62 y.o. year old female, is here today because of her Chronic left shoulder pain [M25.512, G89.29]. [name]'s primary complain today is Back Pain (low), Leg Pain (bilateral), and Shoulder Pain (left)  Last encounter: My last encounter with her was on 03/14/22 Pertinent problems: [name] has Lumbar radiculopathy; Chronic bilateral low back pain with bilateral sciatica; Chronic pain syndrome; Chronic, continuous use of opioids; Chronic myofascial pain; Primary osteoarthritis of both hips; Type 2 diabetes mellitus without complications ([place]); and Pain in both hands on their pertinent problem list. Pain Assessment: Severity of Chronic pain is reported as a 8 /10. Location: Back Lower/ . Onset: More than a month ago. Quality: Aching. Timing: Constant. Modifying factor(s): meds. Vitals:  height is 4\' 11"  (1.499 m) and weight is 102 lb (46.3 kg). Her temperature is 97.2 F (36.2 C) (abnormal). Her blood pressure is 115/75 and her pulse is 106 (abnormal). Her respiration is 16 and oxygen saturation is 98%.   Reason for encounter:   Continues to struggle with left shoulder pain and  ROM, states that she fell approx 7-8 months ago but pain is getting worse, discussed left shoulder injection  Endorsing paraesthesias of bilateral feet, states that she has been doing a better job of monitoring her diet and reducing sugar intake, discussed qutenza She is on insulin twice daily along with metformin Finds benefit with Lyrica 100 mg 3 times a day.  Creatinine function within normal limits. UDS up-to-date and appropriate   Pharmacotherapy Assessment  Analgesic: Norco 7.5 mg TID PRN #90/month   Monitoring: [place] PMP: PDMP reviewed during this encounter.       Pharmacotherapy: No side-effects or adverse reactions reported. Compliance: No problems identified. Effectiveness: Clinically acceptable.  UDS:  Summary  Date Value Ref Range Status  12/13/2021 Note  Final    Comment:    ==================================================================== [employer] 13 (MW) ==================================================================== Test                             Result       Flag       Units  Drug Present and Declared for Prescription Verification   Hydrocodone                    4177         EXPECTED   ng/mg creat   Hydromorphone                  816          EXPECTED   ng/mg creat   Dihydrocodeine                 338  EXPECTED   ng/mg creat   Norhydrocodone                 6097         EXPECTED   ng/mg creat    Sources of hydrocodone include scheduled prescription medications.    Hydromorphone, dihydrocodeine and norhydrocodone are expected    metabolites of hydrocodone. Hydromorphone and dihydrocodeine are    also available as scheduled prescription medications.  ==================================================================== Test                      Result    Flag   Units      Ref Range   Creatinine              77               mg/dL      >=20 ==================================================================== Declared Medications:  The  flagging and interpretation on this report are based on the  following declared medications.  Unexpected results may arise from  inaccuracies in the declared medications.   **Note: The testing scope of this panel includes these medications:   Hydrocodone (Norco)   **Note: The testing scope of this panel does not include the  following reported medications:   Acetaminophen (Norco)  Albuterol (Ventolin HFA)  Aripiprazole (Abilify)  Aspirin  Budesonide (Symbicort)  Cetirizine (Zyrtec)  Famotidine (Pepcid)  Fluticasone (Flonase)  Formoterol (Symbicort)  Insulin (NovoLog)  Iron  Lidocaine  Linaclotide (Linzess)  Losartan (Cozaar)  Meclizine (Antivert)  Metformin  Mirtazapine (Remeron)  Montelukast (Singulair)  Multivitamin  Nicotine  Ondansetron (Zofran)  Pregabalin (Lyrica)  Propranolol (Inderal)  Rizatriptan (Maxalt)  Rosuvastatin (Crestor)  Solifenacin (Vesicare)  Tiotropium (Spiriva)  Topiramate (Topamax)  Trazodone (Desyrel)  Venlafaxine (Effexor)  Vitamin D2 ==================================================================== For clinical consultation, please call [phone]. ====================================================================         ROS  Constitutional: Denies any fever or chills Gastrointestinal: No reported hemesis, hematochezia, vomiting, or acute GI distress Musculoskeletal:  Low back pain, left shoulder pain Neurological:  bilateral feet parasthesias  Medication Review  ARIPiprazole, HYDROcodone-acetaminophen, Insulin Pen Needle, albuterol, aspirin, budesonide-formoterol, cetirizine, famotidine, fluticasone, glucose blood, insulin aspart protamine - aspart, lidocaine, linaclotide, losartan, meclizine, metFORMIN, mirtazapine, montelukast, ondansetron, pregabalin, propranolol, rizatriptan, rosuvastatin, solifenacin, tiotropium, topiramate, traZODone, and venlafaxine XR  History Review  Allergy: [name] is allergic to  tramadol, benadryl [diphenhydramine hcl], bextra [valdecoxib], invokana [canagliflozin], and sulfamethoxazole-trimethoprim. Drug: [name]  reports that she does not currently use drugs. Alcohol:  reports current alcohol use of about 2.0 standard drinks of alcohol per week. Tobacco:  reports that she has quit smoking. Her smoking use included cigarettes. She has a 36.00 pack-year smoking history. She has been exposed to tobacco smoke. She has quit using smokeless tobacco. Social: [name]  reports that she has quit smoking. Her smoking use included cigarettes. She has a 36.00 pack-year smoking history. She has been exposed to tobacco smoke. She has quit using smokeless tobacco. She reports current alcohol use of about 2.0 standard drinks of alcohol per week. She reports that she does not currently use drugs. Medical:  has a past medical history of Abnormal CBC, Asthma, Asthma, BPPV (benign paroxysmal positional vertigo), Cardiac murmur, unspecified (08/28/2016), COPD (chronic obstructive pulmonary disease) (HCC), CPAP (continuous positive airway pressure) dependence, Degenerative disc disease, lumbar, Depression, Diabetes mellitus, Diverticulitis (10/2016), Elevated LFTs, Fatty liver, Fibromyalgia, Fibromyalgia, GERD (gastroesophageal reflux disease), Headache, History of degenerative disc disease, Hyperlipidemia, Hypertension, and Sleep apnea. Surgical: Ms.  [name]  has a past surgical history that includes Abdominal surgery; Abdominal hysterectomy; Esophagogastroduodenoscopy (egd) with propofol (N/A, 02/20/2017); Colonoscopy with propofol (N/A, 02/20/2017); Upper esophageal endoscopic ultrasound (eus) (N/A, 04/03/2017); Diverticulitis; EUS (N/A, 01/08/2018); Cholecystectomy (N/A, 04/27/2018); Cataract extraction w/PHACO (Left, 09/07/2019); Cataract extraction w/PHACO (Right, 09/28/2019); and RIGHT/LEFT HEART CATH AND CORONARY ANGIOGRAPHY (Bilateral, 12/19/2021). Family: family history includes  Diabetes in her mother; Hypertension in her mother.  Laboratory Chemistry Profile   Renal Lab Results  Component Value Date   BUN 20 04/19/2022   CREATININE 0.52 04/19/2022   BCR 14 11/27/2017   GFRAA >60 08/09/2019   GFRNONAA >60 04/19/2022     Hepatic Lab Results  Component Value Date   AST 10 (L) 04/19/2022   ALT 36 04/19/2022   ALBUMIN 3.9 04/19/2022   ALKPHOS 138 (H) 04/19/2022   LIPASE 47 04/19/2022   AMMONIA 46 (H) 04/05/2021     Electrolytes Lab Results  Component Value Date   NA 138 04/19/2022   K 3.7 04/19/2022   CL 107 04/19/2022   CALCIUM 9.8 04/19/2022   MG 1.4 (L) 04/19/2022     Bone No results found for: "VD25OH", "VD125OH2TOT", "[id number]", "[id number]", "25OHVITD1", "25OHVITD2", "[id number]", "TESTOFREE", "TESTOSTERONE"   Inflammation (CRP: Acute Phase) (ESR: Chronic Phase) Lab Results  Component Value Date   CRP <1 11/27/2017   ESRSEDRATE 17 11/27/2017   LATICACIDVEN 1.3 04/05/2021       Note: Above Lab results reviewed.  Recent Imaging Review  CT CHEST LUNG CA SCREEN LOW DOSE W/O CM CLINICAL DATA:  Lung cancer screening. Current smoker with 36 pack-year history. Asymptomatic.  EXAM: CT CHEST WITHOUT CONTRAST LOW-DOSE FOR LUNG CANCER SCREENING  TECHNIQUE: Multidetector CT imaging of the chest was performed following the standard protocol without IV contrast.  RADIATION DOSE REDUCTION: This exam was performed according to the departmental dose-optimization program which includes automated exposure control, adjustment of the mA and/or kV according to patient size and/or use of iterative reconstruction technique.  COMPARISON:  CT of the chest from 08/10/2021  FINDINGS: Cardiovascular: Heart size is upper limits of normal. Small pericardial effusion. Aortic atherosclerosis. Mild coronary artery calcification.  Mediastinum/Nodes: Thyroid gland, trachea, and esophagus are unremarkable. No enlarged mediastinal or axillary lymph  nodes.  Lungs/Pleura: Scattered areas of parenchymal scarring identified bilaterally. Centrilobular and paraseptal emphysema. No pleural fluid or consolidation. Mild mosaic attenuation. Small scattered lung nodules are identified bilaterally. The largest nodule is in the right upper lobe with a mean derived diameter of 4.5 mm.  Upper Abdomen: No acute abnormality.  Status post cholecystectomy.  Musculoskeletal: No acute or suspicious osseous findings.  IMPRESSION: 1. Lung-RADS 2, benign appearance or behavior. Continue annual screening with low-dose chest CT without contrast in 12 months. 2. Aortic Atherosclerosis (ICD10-I70.0) and Emphysema (ICD10-J43.9).  Electronically Signed   By: Kerby Moors M.D.   On: 05/03/2022 16:27  Note: Reviewed        Physical Exam  General appearance: Well nourished, well developed, and well hydrated. In no apparent acute distress Mental status: Alert, oriented x 3 (person, place, & time)       Respiratory: No evidence of acute respiratory distress Eyes: PERLA Vitals: BP 115/75   Pulse (!) 106   Temp (!) 97.2 F (36.2 C)   Resp 16   Ht 4\' 11"  (1.499 m)   Wt 102 lb (46.3 kg)   LMP  (LMP Unknown) Comment: "many years"  SpO2 98%   BMI 20.60 kg/m  BMI: Estimated body mass index is 20.6  kg/m as calculated from the following:   Height as of this encounter: 4\' 11"  (1.499 m).   Weight as of this encounter: 102 lb (46.3 kg). Ideal: Patient must be at least 60 in tall to calculate ideal body weight  Cervical Spine Area Exam  Skin & Axial Inspection: No masses, redness, edema, swelling, or associated skin lesions Alignment: Symmetrical Functional ROM: Unrestricted ROM      Stability: No instability detected Muscle Tone/Strength: Functionally intact. No obvious neuro-muscular anomalies detected. Sensory (Neurological): Musculoskeletal pain pattern, left Palpation: No palpable anomalies             Upper Extremity (UE) Exam    Side: Right  upper extremity  Side: Left upper extremity  Skin & Extremity Inspection: Skin color, temperature, and hair growth are WNL. No peripheral edema or cyanosis. No masses, redness, swelling, asymmetry, or associated skin lesions. No contractures.  Skin & Extremity Inspection: Skin color, temperature, and hair growth are WNL. No peripheral edema or cyanosis. No masses, redness, swelling, asymmetry, or associated skin lesions. No contractures.  Functional ROM: Unrestricted ROM          Functional ROM: Pain restricted ROM for shoulder  Muscle Tone/Strength: Functionally intact. No obvious neuro-muscular anomalies detected.  Muscle Tone/Strength: Functionally intact. No obvious neuro-muscular anomalies detected.  Sensory (Neurological): Unimpaired          Sensory (Neurological): Arthropathic arthralgia          Palpation: No palpable anomalies              Palpation: No palpable anomalies              Provocative Test(s):  Phalen's test: deferred Tinel's test: deferred Apley's scratch test (touch opposite shoulder):  Action 1 (Across chest): deferred Action 2 (Overhead): deferred Action 3 (LB reach): deferred   Provocative Test(s):  Phalen's test: deferred Tinel's test: deferred Apley's scratch test (touch opposite shoulder):  Action 1 (Across chest): Decreased ROM Action 2 (Overhead): Decreased ROM Action 3 (LB reach): Decreased ROM     Assessment   Status Diagnosis  Persistent Persistent Persistent 1. Chronic left shoulder pain   2. Chronic painful diabetic neuropathy ([place])   3. Chronic myofascial pain   4. Chronic, continuous use of opioids   5. Chronic pain syndrome   6. Lumbar radiculopathy   7. Arthritis of left knee   8. Primary osteoarthritis of right knee   9. Primary osteoarthritis of left shoulder        Plan of Care  [name] has a current medication list which includes the following long-term medication(s): albuterol, aripiprazole,  budesonide-formoterol, famotidine, insulin aspart protamine - aspart, losartan, metformin, mirtazapine, propranolol, rizatriptan, topiramate, trazodone, venlafaxine xr, and pregabalin.  1. Chronic left shoulder pain - SHOULDER INJECTION; Future  2. Chronic painful diabetic neuropathy (HCC) - NEUROLYSIS; Future  3. Chronic myofascial pain - HYDROcodone-acetaminophen (NORCO) 7.5-325 MG tablet; Take 1 tablet by mouth 3 (three) times daily as needed for severe pain. Must last 30 days  Dispense: 90 tablet; Refill: 0 - HYDROcodone-acetaminophen (NORCO) 7.5-325 MG tablet; Take 1 tablet by mouth 3 (three) times daily as needed for severe pain. Must last 30 days  Dispense: 90 tablet; Refill: 0 - HYDROcodone-acetaminophen (NORCO) 7.5-325 MG tablet; Take 1 tablet by mouth 3 (three) times daily as needed for severe pain. Must last 30 days  Dispense: 90 tablet; Refill: 0 - pregabalin (LYRICA) 100 MG capsule; Take 1 capsule (100 mg total) by mouth 3 (three)  times daily.  Dispense: 90 capsule; Refill: 5  4. Chronic, continuous use of opioids - HYDROcodone-acetaminophen (NORCO) 7.5-325 MG tablet; Take 1 tablet by mouth 3 (three) times daily as needed for severe pain. Must last 30 days  Dispense: 90 tablet; Refill: 0 - HYDROcodone-acetaminophen (NORCO) 7.5-325 MG tablet; Take 1 tablet by mouth 3 (three) times daily as needed for severe pain. Must last 30 days  Dispense: 90 tablet; Refill: 0 - HYDROcodone-acetaminophen (NORCO) 7.5-325 MG tablet; Take 1 tablet by mouth 3 (three) times daily as needed for severe pain. Must last 30 days  Dispense: 90 tablet; Refill: 0  5. Chronic pain syndrome - HYDROcodone-acetaminophen (NORCO) 7.5-325 MG tablet; Take 1 tablet by mouth 3 (three) times daily as needed for severe pain. Must last 30 days  Dispense: 90 tablet; Refill: 0 - HYDROcodone-acetaminophen (NORCO) 7.5-325 MG tablet; Take 1 tablet by mouth 3 (three) times daily as needed for severe pain. Must last 30 days   Dispense: 90 tablet; Refill: 0 - HYDROcodone-acetaminophen (NORCO) 7.5-325 MG tablet; Take 1 tablet by mouth 3 (three) times daily as needed for severe pain. Must last 30 days  Dispense: 90 tablet; Refill: 0 - pregabalin (LYRICA) 100 MG capsule; Take 1 capsule (100 mg total) by mouth 3 (three) times daily.  Dispense: 90 capsule; Refill: 5  6. Lumbar radiculopathy - HYDROcodone-acetaminophen (NORCO) 7.5-325 MG tablet; Take 1 tablet by mouth 3 (three) times daily as needed for severe pain. Must last 30 days  Dispense: 90 tablet; Refill: 0 - HYDROcodone-acetaminophen (NORCO) 7.5-325 MG tablet; Take 1 tablet by mouth 3 (three) times daily as needed for severe pain. Must last 30 days  Dispense: 90 tablet; Refill: 0 - HYDROcodone-acetaminophen (NORCO) 7.5-325 MG tablet; Take 1 tablet by mouth 3 (three) times daily as needed for severe pain. Must last 30 days  Dispense: 90 tablet; Refill: 0 - pregabalin (LYRICA) 100 MG capsule; Take 1 capsule (100 mg total) by mouth 3 (three) times daily.  Dispense: 90 capsule; Refill: 5  7. Arthritis of left knee - HYDROcodone-acetaminophen (NORCO) 7.5-325 MG tablet; Take 1 tablet by mouth 3 (three) times daily as needed for severe pain. Must last 30 days  Dispense: 90 tablet; Refill: 0 - HYDROcodone-acetaminophen (NORCO) 7.5-325 MG tablet; Take 1 tablet by mouth 3 (three) times daily as needed for severe pain. Must last 30 days  Dispense: 90 tablet; Refill: 0 - HYDROcodone-acetaminophen (NORCO) 7.5-325 MG tablet; Take 1 tablet by mouth 3 (three) times daily as needed for severe pain. Must last 30 days  Dispense: 90 tablet; Refill: 0  8. Primary osteoarthritis of right knee - HYDROcodone-acetaminophen (NORCO) 7.5-325 MG tablet; Take 1 tablet by mouth 3 (three) times daily as needed for severe pain. Must last 30 days  Dispense: 90 tablet; Refill: 0 - HYDROcodone-acetaminophen (NORCO) 7.5-325 MG tablet; Take 1 tablet by mouth 3 (three) times daily as needed for severe pain.  Must last 30 days  Dispense: 90 tablet; Refill: 0 - HYDROcodone-acetaminophen (NORCO) 7.5-325 MG tablet; Take 1 tablet by mouth 3 (three) times daily as needed for severe pain. Must last 30 days  Dispense: 90 tablet; Refill: 0  9. Primary osteoarthritis of left shoulder - DG Shoulder Left; Future  Pharmacotherapy (Medications Ordered): Meds ordered this encounter  Medications   HYDROcodone-acetaminophen (NORCO) 7.5-325 MG tablet    Sig: Take 1 tablet by mouth 3 (three) times daily as needed for severe pain. Must last 30 days    Dispense:  90 tablet    Refill:  0    Chronic Pain: STOP Act (Not applicable) Fill 1 day early if closed on refill date. Avoid benzodiazepines within 8 hours of opioids   HYDROcodone-acetaminophen (NORCO) 7.5-325 MG tablet    Sig: Take 1 tablet by mouth 3 (three) times daily as needed for severe pain. Must last 30 days    Dispense:  90 tablet    Refill:  0    Chronic Pain: STOP Act (Not applicable) Fill 1 day early if closed on refill date. Avoid benzodiazepines within 8 hours of opioids   HYDROcodone-acetaminophen (NORCO) 7.5-325 MG tablet    Sig: Take 1 tablet by mouth 3 (three) times daily as needed for severe pain. Must last 30 days    Dispense:  90 tablet    Refill:  0    Chronic Pain: STOP Act (Not applicable) Fill 1 day early if closed on refill date. Avoid benzodiazepines within 8 hours of opioids   pregabalin (LYRICA) 100 MG capsule    Sig: Take 1 capsule (100 mg total) by mouth 3 (three) times daily.    Dispense:  90 capsule    Refill:  5    Follow-up plan:   Return in about 4 weeks (around 07/11/2022) for left shoulder injection + Qutenza, in clinic (PO Valium).   Recent Visits No visits were found meeting these conditions. Showing recent visits within past 90 days and meeting all other requirements Today's Visits Date Type Provider Dept  06/13/22 Office Visit Gillis Santa, MD [facility]  Showing today's visits and meeting all other  requirements Future Appointments No visits were found meeting these conditions. Showing future appointments within next 90 days and meeting all other requirements  I discussed the assessment and treatment plan with the patient. The patient was provided an opportunity to ask questions and all were answered. The patient agreed with the plan and demonstrated an understanding of the instructions.  Patient advised to call back or seek an in-person evaluation if the symptoms or condition worsens.  Duration of encounter: 30 minutes.  Note by: Gillis Santa, MD Date: 06/13/2022; Time: 9:22 AM

## 2022-06-13 NOTE — Progress Notes (Signed)
Nursing Pain Medication Assessment:  Safety precautions to be maintained throughout the outpatient stay will include: orient to surroundings, keep bed in low position, maintain call bell within reach at all times, provide assistance with transfer out of bed and ambulation.  Medication Inspection Compliance: Pill count conducted under aseptic conditions, in front of the patient. Neither the pills nor the bottle was removed from the patient's sight at any time. Once count was completed pills were immediately returned to the patient in their original bottle.  Medication: Hydrocodone/APAP Pill/Patch Count:  44 of 90 pills remain Pill/Patch Appearance: Markings consistent with prescribed medication Bottle Appearance: Standard pharmacy container. Clearly labeled. Filled Date: 03 / 04 / 2024 Last Medication intake:  Today

## 2022-06-21 ENCOUNTER — Telehealth: Payer: Self-pay

## 2022-06-21 DIAGNOSIS — G8929 Other chronic pain: Secondary | ICD-10-CM

## 2022-06-21 DIAGNOSIS — M19012 Primary osteoarthritis, left shoulder: Secondary | ICD-10-CM

## 2022-06-25 NOTE — Telephone Encounter (Signed)
Called patient to let her know BL response re;  imaging.  Reminded that she has a shoulder injection scheduled for April 19 and he would discuss further with her at that time.  Patient verbalizes u/o information.

## 2022-07-10 ENCOUNTER — Ambulatory Visit
Payer: Medicaid Other | Attending: Student in an Organized Health Care Education/Training Program | Admitting: Student in an Organized Health Care Education/Training Program

## 2022-07-10 ENCOUNTER — Encounter: Payer: Self-pay | Admitting: Student in an Organized Health Care Education/Training Program

## 2022-07-10 ENCOUNTER — Ambulatory Visit
Admission: RE | Admit: 2022-07-10 | Discharge: 2022-07-10 | Disposition: A | Discharge status: Home / Self Care | Payer: Medicaid Other | Source: Ambulatory Visit | Attending: Student in an Organized Health Care Education/Training Program | Admitting: Student in an Organized Health Care Education/Training Program

## 2022-07-10 VITALS — BP 127/97 | HR 99 | Temp 97.1°F | Resp 12 | Ht 59.0 in | Wt 104.0 lb

## 2022-07-10 DIAGNOSIS — Z7984 Long term (current) use of oral hypoglycemic drugs: Secondary | ICD-10-CM

## 2022-07-10 DIAGNOSIS — M19012 Primary osteoarthritis, left shoulder: Secondary | ICD-10-CM | POA: Diagnosis not present

## 2022-07-10 DIAGNOSIS — M25512 Pain in left shoulder: Secondary | ICD-10-CM | POA: Diagnosis present

## 2022-07-10 DIAGNOSIS — G8929 Other chronic pain: Secondary | ICD-10-CM | POA: Insufficient documentation

## 2022-07-10 DIAGNOSIS — E1140 Type 2 diabetes mellitus with diabetic neuropathy, unspecified: Secondary | ICD-10-CM | POA: Insufficient documentation

## 2022-07-10 DIAGNOSIS — M25511 Pain in right shoulder: Secondary | ICD-10-CM | POA: Diagnosis present

## 2022-07-10 DIAGNOSIS — M19011 Primary osteoarthritis, right shoulder: Secondary | ICD-10-CM | POA: Diagnosis not present

## 2022-07-10 DIAGNOSIS — G894 Chronic pain syndrome: Secondary | ICD-10-CM | POA: Diagnosis present

## 2022-07-10 MED ORDER — DIAZEPAM 5 MG PO TABS
ORAL_TABLET | ORAL | Status: AC
  Filled 2022-07-10: qty 1

## 2022-07-10 MED ORDER — IOHEXOL 180 MG/ML  SOLN
10.0000 mL | Freq: Once | INTRAMUSCULAR | Status: AC
  Administered 2022-07-10: 10 mL via INTRA_ARTICULAR

## 2022-07-10 MED ORDER — ROPIVACAINE HCL 2 MG/ML IJ SOLN: 9.0000 mL | Freq: Once | INTRAMUSCULAR | Status: DC

## 2022-07-10 MED ORDER — DIAZEPAM 5 MG PO TABS
5.0000 mg | ORAL_TABLET | ORAL | Status: AC
  Administered 2022-07-10: 5 mg via ORAL

## 2022-07-10 MED ORDER — METHYLPREDNISOLONE ACETATE 80 MG/ML IJ SUSP
80.0000 mg | Freq: Once | INTRAMUSCULAR | Status: AC
  Administered 2022-07-10: 80 mg via INTRA_ARTICULAR

## 2022-07-10 MED ORDER — CAPSAICIN-CLEANSING GEL 8 % EX KIT
4.0000 | PACK | Freq: Once | CUTANEOUS | Status: AC
  Administered 2022-07-10: 4 via TOPICAL

## 2022-07-10 MED ORDER — LIDOCAINE HCL (PF) 2 % IJ SOLN
INTRAMUSCULAR | Status: AC
  Filled 2022-07-10: qty 10

## 2022-07-10 MED ORDER — ROPIVACAINE HCL 2 MG/ML IJ SOLN
INTRAMUSCULAR | Status: AC
  Filled 2022-07-10: qty 20

## 2022-07-10 MED ORDER — IOHEXOL 180 MG/ML  SOLN
INTRAMUSCULAR | Status: AC
  Filled 2022-07-10: qty 20

## 2022-07-10 MED ORDER — LIDOCAINE HCL 2 % IJ SOLN
20.0000 mL | Freq: Once | INTRAMUSCULAR | Status: AC
  Administered 2022-07-10: 200 mg

## 2022-07-10 MED ORDER — METHYLPREDNISOLONE ACETATE 80 MG/ML IJ SUSP
INTRAMUSCULAR | Status: AC
  Filled 2022-07-10: qty 1

## 2022-07-10 NOTE — Progress Notes (Signed)
PROVIDER NOTE: Interpretation of information contained herein should be left to medically-trained personnel. Specific patient instructions are provided elsewhere under "Patient Instructions" section of medical record. This document was created in part using STT-dictation technology, any transcriptional errors that may result from this process are unintentional.  Patient: [name] Type: Established DOB: 12-04-1960 MRN: [id number] PCP: Dan Humphreys, MD  Service: Procedure DOS: 07/10/2022 Setting: Ambulatory Location: Ambulatory outpatient facility Delivery: Face-to-face Provider: Edward Jolly, MD Specialty: Interventional Pain Management Specialty designation: 09 Location: Outpatient facility Ref. Prov.: Dan Humphreys, MD       Interventional Therapy   Interventional Treatment:           Type: Qutenza Neurolysis #1  Laterality:  Bilateral Area treated: Feet Imaging Guidance: None Anesthesia/analgesia/anxiolysis/sedation: None required Medication (Right): Qutenza (capsaicin 8%) topical system Medication (Left): Qutenza (capsaicin 8%) topical system Date: 07/10/2022 Performed by: Edward Jolly, MD Rationale (medical necessity): procedure needed and proper for the treatment of [name]'s medical symptoms and needs. Indication: Painful diabetic peripheral neuralgia (DPN) (ICD-10-CM:E11.40) severe enough to impact quality of life or function.   Pre-procedure: 8 /10   Post-procedure: 6  (patient to exam room for Qutenza)/10     Position / Prep / Materials:  Position: Supine  Materials: Qutenza Kit  Pre-op H&P Assessment:  [name] is a 62 y.o. (year old), female patient, seen today for interventional treatment. She  has a past surgical history that includes Abdominal surgery; Abdominal hysterectomy; Esophagogastroduodenoscopy (egd) with propofol (N/A, 02/20/2017); Colonoscopy with propofol (N/A, 02/20/2017); Upper esophageal endoscopic ultrasound (eus) (N/A,  04/03/2017); Diverticulitis; EUS (N/A, 01/08/2018); Cholecystectomy (N/A, 04/27/2018); Cataract extraction w/PHACO (Left, 09/07/2019); Cataract extraction w/PHACO (Right, 09/28/2019); and RIGHT/LEFT HEART CATH AND CORONARY ANGIOGRAPHY (Bilateral, 12/19/2021). [name] has a current medication list which includes the following prescription(s): accu-chek guide, albuterol, aripiprazole, aspirin, budesonide-formoterol, cetirizine, famotidine, fluticasone, glipizide, hydrocodone-acetaminophen, [START ON 07/26/2022] hydrocodone-acetaminophen, [START ON 08/25/2022] hydrocodone-acetaminophen, insulin aspart protamine - aspart, insulin pen needle, lidocaine, linzess, losartan, magnesium oxide, meclizine, metformin, mirtazapine, montelukast, omeprazole, ondansetron, pregabalin, propranolol, rizatriptan, rosuvastatin, solifenacin, tiotropium, topiramate, trazodone, and venlafaxine xr, and the following Facility-Administered Medications: ropivacaine (pf) 2 mg/ml (0.2%). Her primarily concern today is the Back Pain (mid)  Initial Vital Signs:  Pulse/HCG Rate: 99ECG Heart Rate: 98 Temp: (!) 97.1 F (36.2 C) Resp: (!) 22 BP: 107/88 SpO2: 97 %  BMI: Estimated body mass index is 21.01 kg/m as calculated from the following:   Height as of this encounter: 4\' 11"  (1.499 m).   Weight as of this encounter: 104 lb (47.2 kg).  Risk Assessment: Allergies: Reviewed. She is allergic to tramadol, benadryl [diphenhydramine hcl], bextra [valdecoxib], invokana [canagliflozin], and sulfamethoxazole-trimethoprim.  Allergy Precautions: None required Coagulopathies: Reviewed. None identified.  Blood-thinner therapy: None at this time Active Infection(s): Reviewed. None identified. [name] is afebrile  Site Confirmation: [name] was asked to confirm the procedure and laterality before marking the site Procedure checklist: Completed Consent: Before the procedure and under the influence of no sedative(s), amnesic(s),  or anxiolytics, the patient was informed of the treatment options, risks and possible complications. To fulfill our ethical and legal obligations, as recommended by the American Medical Association's Code of Ethics, I have informed the patient of my clinical impression; the nature and purpose of the treatment or procedure; the risks, benefits, and possible complications of the intervention; the alternatives, including doing nothing; the risk(s) and benefit(s) of the alternative treatment(s) or procedure(s); and the risk(s) and benefit(s) of doing nothing. The patient was  provided information about the general risks and possible complications associated with the procedure. These may include, but are not limited to: failure to achieve desired goals, infection, bleeding, organ or nerve damage, allergic reactions, paralysis, and death. In addition, the patient was informed of those risks and complications associated to the procedure, such as failure to decrease pain; infection; bleeding; organ or nerve damage with subsequent damage to sensory, motor, and/or autonomic systems, resulting in permanent pain, numbness, and/or weakness of one or several areas of the body; allergic reactions; (i.e.: anaphylactic reaction); and/or death. Furthermore, the patient was informed of those risks and complications associated with the medications. These include, but are not limited to: allergic reactions (i.e.: anaphylactic or anaphylactoid reaction(s)); adrenal axis suppression; blood sugar elevation that in diabetics may result in ketoacidosis or comma; water retention that in patients with history of congestive heart failure may result in shortness of breath, pulmonary edema, and decompensation with resultant heart failure; weight gain; swelling or edema; medication-induced neural toxicity; particulate matter embolism and blood vessel occlusion with resultant organ, and/or nervous system infarction; and/or aseptic necrosis of one  or more joints. Finally, the patient was informed that Medicine is not an exact science; therefore, there is also the possibility of unforeseen or unpredictable risks and/or possible complications that may result in a catastrophic outcome. The patient indicated having understood very clearly. We have given the patient no guarantees and we have made no promises. Enough time was given to the patient to ask questions, all of which were answered to the patient's satisfaction. Ms. Roark has indicated that she wanted to continue with the procedure. Attestation: I, the ordering provider, attest that I have discussed with the patient the benefits, risks, side-effects, alternatives, likelihood of achieving goals, and potential problems during recovery for the procedure that I have provided informed consent. Date  Time: 07/10/2022  9:00 AM  Pre-Procedure Preparation:  Monitoring: As per clinic protocol. Respiration, ETCO2, SpO2, BP, heart rate and rhythm monitor placed and checked for adequate function Safety Precautions: Patient was assessed for positional comfort and pressure points before starting the procedure. Time-out: I initiated and conducted the "Time-out" before starting the procedure, as per protocol. The patient was asked to participate by confirming the accuracy of the "Time Out" information. Verification of the correct person, site, and procedure were performed and confirmed by me, the nursing staff, and the patient. "Time-out" conducted as per Joint Commission's Universal Protocol (UP.01.01.01). Time: 0940 Start Time: 0945 hrs.  Description/Narrative of Procedure:          Region: Distal lower extremity Target Area: Sensory peripheral nerves affected by diabetic peripheral neuropathy Site: Feet Approach: Percutaneous  No./Series: Not applicable  Type: Percutaneous  Purpose: Therapeutic  Region: Distal lower extremities  Start Time: 0945 hrs.  Description of the Procedure: Protocol  guidelines were followed. The patient was assisted into a comfortable position.  Informed consent was obtained in the patient monitored in the usual manner.  All questions were answered prior to the procedure.  They Qutenza patches were applied to the affected area and then covered with the wrap.  The Patient was kept under observation until the treatment was completed.  The patches were removed and the treated area was inspected.  Vitals:   07/10/22 0905 07/10/22 0925 07/10/22 0928 07/10/22 0933  BP: 107/88 (!) 127/97 120/86 (!) 127/97  Pulse: 99     Resp:  (!) 22 12 12   Temp: (!) 97.1 F (36.2 C)     TempSrc: Temporal  SpO2: 97% 96% 97% 94%  Weight: 104 lb (47.2 kg)     Height:  (1.499 m)        End Time: 1030 hrs.  Post-operative Assessment:  Post-procedure Vital Signs:  Pulse/HCG Rate: 9990 Temp: (!) 97.1 F (36.2 C) Resp: 12 BP: (!) 127/97 SpO2: 94 %  EBL: None  Complications: No immediate post-treatment complications observed by team, or reported by patient.  Note: The patient tolerated the entire procedure well. A repeat set of vitals were taken after the procedure and the patient was kept under observation following institutional policy, for this type of procedure. Post-procedural neurological assessment was performed, showing return to baseline, prior to discharge. The patient was provided with post-procedure discharge instructions, including a section on how to identify potential problems. Should any problems arise concerning this procedure, the patient was given instructions to immediately contact us, at any time, without hesitation. In any case, we plan to contact the patient by telephone for a follow-up status report regarding this interventional procedure.  Comments:  No additional relevant information.  Plan of Care (POC)  Orders:  Orders Placed This Encounter  Procedures   DG PAIN CLINIC C-ARM 1-60 MIN NO REPORT    Intraoperative interpretation by  procedural physician at [facility].    Standing Status:   Standing    Number of Occurrences:   1    Order Specific Question:   Reason for exam:    Answer:   Assistance in needle guidance and placement for procedures requiring needle placement in or near specific anatomical locations not easily accessible without such assistance.   Chronic Opioid Analgesic:  Norco 7.5 mg TID PRN #90/month   Medications ordered for procedure: Meds ordered this encounter  Medications   iohexol (OMNIPAQUE) 180 MG/ML injection 10 mL    Must be Myelogram-compatible. If not available, you may substitute with a water-soluble, non-ionic, hypoallergenic, myelogram-compatible radiological contrast medium.   lidocaine (XYLOCAINE) 2 % (with pres) injection 400 mg   diazepam (VALIUM) tablet 5 mg    Make sure Flumazenil is available in the pyxis when using this medication. If oversedation occurs, administer 0.2 mg IV over 15 sec. If after 45 sec no response, administer 0.2 mg again over 1 min; may repeat at 1 min intervals; not to exceed 4 doses (1 mg)   methylPREDNISolone acetate (DEPO-MEDROL) injection 80 mg   ropivacaine (PF) 2 mg/mL (0.2%) (NAROPIN) injection 9 mL   capsaicin topical system 8 % patch 4 patch   Medications administered: We administered iohexol, lidocaine, diazepam, methylPREDNISolone acetate, and capsaicin topical system.  See the medical record for exact dosing, route, and time of administration.  Follow-up plan:   Return for Keep sch. appt.       Recent Visits Date Type Provider Dept  06/13/22 Office Visit Edward Jolly, MD [facility]  Showing recent visits within past 90 days and meeting all other requirements Today's Visits Date Type Provider Dept  07/10/22 Procedure visit Edward Jolly, MD [facility]  Showing today's visits and meeting all other requirements Future Appointments Date Type Provider Dept  09/03/22 Appointment Edward Jolly, MD [facility]  Showing future appointments within next 90 days and meeting all other requirements  Disposition: Discharge home  Discharge (Date  Time): 07/10/2022; 1040 hrs.   Primary Care Physician: Dan Humphreys, MD Location: [facility] Note by: Edward Jolly, MD (TTS technology used. I apologize for any typographical errors that were not  detected and corrected.) Date: 07/10/2022; Time: 10:44 AM  Disclaimer:  Medicine is not an Visual merchandiser. The only guarantee in medicine is that nothing is guaranteed. It is important to note that the decision to proceed with this intervention was based on the information collected from the patient. The Data and conclusions were drawn from the patient's questionnaire, the interview, and the physical examination. Because the information was provided in large part by the patient, it cannot be guaranteed that it has not been purposely or unconsciously manipulated. Every effort has been made to obtain as much relevant data as possible for this evaluation. It is important to note that the conclusions that lead to this procedure are derived in large part from the available data. Always take into account that the treatment will also be dependent on availability of resources and existing treatment guidelines, considered by other Pain Management Practitioners as being common knowledge and practice, at the time of the intervention. For Medico-Legal purposes, it is also important to point out that variation in procedural techniques and pharmacological choices are the acceptable norm. The indications, contraindications, technique, and results of the above procedure should only be interpreted and judged by a Board-Certified Interventional Pain Specialist with extensive familiarity and expertise in the same exact procedure and technique.

## 2022-07-10 NOTE — Patient Instructions (Signed)

## 2022-07-10 NOTE — Progress Notes (Signed)
PROVIDER NOTE: Interpretation of information contained herein should be left to medically-trained personnel. Specific patient instructions are provided elsewhere under "Patient Instructions" section of medical record. This document was created in part using STT-dictation technology, any transcriptional errors that may result from this process are unintentional.  Patient: [name] Type: Established DOB: [birth year] MRN: [id number] PCP: Dan Humphreys, MD  Service: Procedure DOS: 07/10/2022 Setting: Ambulatory Location: Ambulatory outpatient facility Delivery: Face-to-face Provider: Edward Jolly, MD Specialty: Interventional Pain Management Specialty designation: 09 Location: Outpatient facility Ref. Prov.: Dan Humphreys, MD       Interventional Therapy   Procedure: Glenohumeral Joint (shoulder) Injection #1  Laterality: Bilateral (-50)  Level: Shoulder   Imaging: Fluoroscopy-guided         Anesthesia: Local anesthesia (1-2% Lidocaine) Anxiolysis: Oral Valium 5 mg DOS: 07/10/2022  Performed by: Edward Jolly, MD  Purpose: Diagnostic/Therapeutic Indications: Shoulder pain severe enough to impact quality of life or function. Rationale (medical necessity): procedure needed and proper for the diagnosis and/or treatment of [name]'s medical symptoms and needs. 1. Chronic left shoulder pain   2. Primary osteoarthritis of left shoulder   3. Arthritis of left acromioclavicular joint   4. Primary osteoarthritis of right shoulder   5. Chronic right shoulder pain   6. Chronic painful diabetic neuropathy   7. Chronic pain syndrome    NAS-11 Pain score:   Pre-procedure: 8 /10   Post-procedure: 6  (patient to exam room for Qutenza)/10      Target: Glenohumeral Joint (shoulder) Location: Intra-articular  Region: Entire Shoulder Area Approach: Anterolateral approach. Type of procedure: Percutaneous joint injection   Position  Prep  Materials:  Position: Supine Prep  solution: DuraPrep (Iodine Povacrylex [0.7% available iodine] and Isopropyl Alcohol, 74% w/w) Prep Area: Entire shoulder Area Materials:  Tray: Block Needle(s):  Type: Spinal  Gauge (G): 22  Length: 3.5-in  Qty: 1  Pre-op H&P Assessment:  [name] is a 62 y.o. (year old), female patient, seen today for interventional treatment. She  has a past surgical history that includes Abdominal surgery; Abdominal hysterectomy; Esophagogastroduodenoscopy (egd) with propofol (N/A, 02/20/2017); Colonoscopy with propofol (N/A, 02/20/2017); Upper esophageal endoscopic ultrasound (eus) (N/A, 04/03/2017); Diverticulitis; EUS (N/A, 01/08/2018); Cholecystectomy (N/A, 04/27/2018); Cataract extraction w/PHACO (Left, 09/07/2019); Cataract extraction w/PHACO (Right, 09/28/2019); and RIGHT/LEFT HEART CATH AND CORONARY ANGIOGRAPHY (Bilateral, 12/19/2021). [name] has a current medication list which includes the following prescription(s): accu-chek guide, albuterol, aripiprazole, aspirin, budesonide-formoterol, cetirizine, famotidine, fluticasone, glipizide, hydrocodone-acetaminophen, [START ON 07/26/2022] hydrocodone-acetaminophen, [START ON 08/25/2022] hydrocodone-acetaminophen, insulin aspart protamine - aspart, insulin pen needle, lidocaine, linzess, losartan, magnesium oxide, meclizine, metformin, mirtazapine, montelukast, omeprazole, ondansetron, pregabalin, propranolol, rizatriptan, rosuvastatin, solifenacin, tiotropium, topiramate, trazodone, and venlafaxine xr, and the following Facility-Administered Medications: ropivacaine (pf) 2 mg/ml (0.2%). Her primarily concern today is the Back Pain (mid)  Initial Vital Signs:  Pulse/HCG Rate: 99ECG Heart Rate: 98 Temp: (!) 97.1 F (36.2 C) Resp: (!) 22 BP: 107/88 SpO2: 97 %  BMI: Estimated body mass index is 21.01 kg/m as calculated from the following:   Height as of this encounter: 4\' 11"  (1.499 m).   Weight as of this encounter: 104 lb (47.2 kg).  Risk  Assessment: Allergies: Reviewed. She is allergic to tramadol, benadryl [diphenhydramine hcl], bextra [valdecoxib], invokana [canagliflozin], and sulfamethoxazole-trimethoprim.  Allergy Precautions: None required Coagulopathies: Reviewed. None identified.  Blood-thinner therapy: None at this time Active Infection(s): Reviewed. None identified. [name] is afebrile  Site Confirmation: [name] was asked to confirm the procedure and  laterality before marking the site Procedure checklist: Completed Consent: Before the procedure and under the influence of no sedative(s), amnesic(s), or anxiolytics, the patient was informed of the treatment options, risks and possible complications. To fulfill our ethical and legal obligations, as recommended by the American Medical Association's Code of Ethics, I have informed the patient of my clinical impression; the nature and purpose of the treatment or procedure; the risks, benefits, and possible complications of the intervention; the alternatives, including doing nothing; the risk(s) and benefit(s) of the alternative treatment(s) or procedure(s); and the risk(s) and benefit(s) of doing nothing. The patient was provided information about the general risks and possible complications associated with the procedure. These may include, but are not limited to: failure to achieve desired goals, infection, bleeding, organ or nerve damage, allergic reactions, paralysis, and death. In addition, the patient was informed of those risks and complications associated to the procedure, such as failure to decrease pain; infection; bleeding; organ or nerve damage with subsequent damage to sensory, motor, and/or autonomic systems, resulting in permanent pain, numbness, and/or weakness of one or several areas of the body; allergic reactions; (i.e.: anaphylactic reaction); and/or death. Furthermore, the patient was informed of those risks and complications associated with the  medications. These include, but are not limited to: allergic reactions (i.e.: anaphylactic or anaphylactoid reaction(s)); adrenal axis suppression; blood sugar elevation that in diabetics may result in ketoacidosis or comma; water retention that in patients with history of congestive heart failure may result in shortness of breath, pulmonary edema, and decompensation with resultant heart failure; weight gain; swelling or edema; medication-induced neural toxicity; particulate matter embolism and blood vessel occlusion with resultant organ, and/or nervous system infarction; and/or aseptic necrosis of one or more joints. Finally, the patient was informed that Medicine is not an exact science; therefore, there is also the possibility of unforeseen or unpredictable risks and/or possible complications that may result in a catastrophic outcome. The patient indicated having understood very clearly. We have given the patient no guarantees and we have made no promises. Enough time was given to the patient to ask questions, all of which were answered to the patient's satisfaction. [name] has indicated that she wanted to continue with the procedure. Attestation: I, the ordering provider, attest that I have discussed with the patient the benefits, risks, side-effects, alternatives, likelihood of achieving goals, and potential problems during recovery for the procedure that I have provided informed consent. Date  Time: 07/10/2022  9:00 AM   Imaging Guidance (Non-Spinal):          Type of Imaging Technique: Fluoroscopy Guidance (Non-Spinal) Indication(s): Assistance in needle guidance and placement for procedures requiring needle placement in or near specific anatomical locations not easily accessible without such assistance. Exposure Time: Please see nurses notes. Contrast: None used. Fluoroscopic Guidance: I was personally present during the use of fluoroscopy. "Tunnel Vision Technique" used to obtain the best  possible view of the target area. Parallax error corrected before commencing the procedure. "Direction-depth-direction" technique used to introduce the needle under continuous pulsed fluoroscopy. Once target was reached, antero-posterior, oblique, and lateral fluoroscopic projection used confirm needle placement in all planes. Images permanently stored in EMR. Interpretation: No contrast injected. I personally interpreted the imaging intraoperatively. Adequate needle placement confirmed in multiple planes. Permanent images saved into the patient's record.  Pre-Procedure Preparation:  Monitoring: As per clinic protocol. Respiration, ETCO2, SpO2, BP, heart rate and rhythm monitor placed and checked for adequate function Safety Precautions: Patient was assessed for positional  comfort and pressure points before starting the procedure. Time-out: I initiated and conducted the "Time-out" before starting the procedure, as per protocol. The patient was asked to participate by confirming the accuracy of the "Time Out" information. Verification of the correct person, site, and procedure were performed and confirmed by me, the nursing staff, and the patient. "Time-out" conducted as per Joint Commission's Universal Protocol (UP.01.01.01). Time: 0940 Start Time: 0945 hrs.  Description  Narrative of Procedure:          Rationale (medical necessity): procedure needed and proper for the diagnosis and/or treatment of the patient's medical symptoms and needs. Procedural Technique Safety Precautions: Aspiration looking for blood return was conducted prior to all injections. At no point did we inject any substances, as a needle was being advanced. No attempts were made at seeking any paresthesias. Safe injection practices and needle disposal techniques used. Medications properly checked for expiration dates. SDV (single dose vial) medications used. Description of the Procedure: Protocol guidelines were followed. The  patient was placed in position over the procedure table. The target area was identified and the area prepped in the usual manner. Skin & deeper tissues infiltrated with local anesthetic. Appropriate amount of time allowed to pass for local anesthetics to take effect. The procedure needles were then advanced to the target area. Proper needle placement secured. Negative aspiration confirmed. Solution injected in intermittent fashion, asking for systemic symptoms every 0.5cc of injectate. The needles were then removed and the area cleansed, making sure to leave some of the prepping solution back to take advantage of its long term bactericidal properties.             Vitals:   07/10/22 0905 07/10/22 0925 07/10/22 0928 07/10/22 0933  BP: 107/88 (!) 127/97 120/86 (!) 127/97  Pulse: 99     Resp:  (!) Temp: (!) 97.1 F (36.2 C)     TempSrc: Temporal     SpO2: 97% 96% 97% 94%  Weight: 104 lb (47.2 kg)     Height:  (1.499 m)       10 cc solution made of 9 cc of 0.2% ropivacaine, 1 cc of methylprednisolone, 80 mg/cc.  5 cc injected into each shoulder after contrast confirmation.  Start Time: 0945 hrs. End Time: 1030 hrs.  Antibiotic Prophylaxis:   Anti-infectives (From admission, onward)    None      Indication(s): None identified  Post-operative Assessment:  Post-procedure Vital Signs:  Pulse/HCG Rate: 9990 Temp: (!) 97.1 F (36.2 C) Resp: 12 BP: (!) 127/97 SpO2: 94 %  EBL: None  Complications: No immediate post-treatment complications observed by team, or reported by patient.  Note: The patient tolerated the entire procedure well. A repeat set of vitals were taken after the procedure and the patient was kept under observation following institutional policy, for this type of procedure. Post-procedural neurological assessment was performed, showing return to baseline, prior to discharge. The patient was provided with post-procedure discharge instructions,  including a section on how to identify potential problems. Should any problems arise concerning this procedure, the patient was given instructions to immediately contact us, at any time, without hesitation. In any case, we plan to contact the patient by telephone for a follow-up status report regarding this interventional procedure.  Comments:  No additional relevant information.  Plan of Care (POC)  Orders:  Orders Placed This Encounter  Procedures   DG PAIN CLINIC C-ARM 1-60 MIN NO REPORT    Intraoperative interpretation by  procedural physician at [facility].    Standing Status:   Standing    Number of Occurrences:   1    Order Specific Question:   Reason for exam:    Answer:   Assistance in needle guidance and placement for procedures requiring needle placement in or near specific anatomical locations not easily accessible without such assistance.   Chronic Opioid Analgesic:  Norco 7.5 mg TID PRN #90/month   Medications ordered for procedure: Meds ordered this encounter  Medications   iohexol (OMNIPAQUE) 180 MG/ML injection 10 mL    Must be Myelogram-compatible. If not available, you may substitute with a water-soluble, non-ionic, hypoallergenic, myelogram-compatible radiological contrast medium.   lidocaine (XYLOCAINE) 2 % (with pres) injection 400 mg   diazepam (VALIUM) tablet 5 mg    Make sure Flumazenil is available in the pyxis when using this medication. If oversedation occurs, administer 0.2 mg IV over 15 sec. If after 45 sec no response, administer 0.2 mg again over 1 min; may repeat at 1 min intervals; not to exceed 4 doses (1 mg)   methylPREDNISolone acetate (DEPO-MEDROL) injection 80 mg   ropivacaine (PF) 2 mg/mL (0.2%) (NAROPIN) injection 9 mL   capsaicin topical system 8 % patch 4 patch   Medications administered: We administered iohexol, lidocaine, diazepam, methylPREDNISolone acetate, and capsaicin topical system.  See the medical record for exact dosing,  route, and time of administration.  Follow-up plan:   Return for Keep sch. appt.       Recent Visits Date Type Provider Dept  06/13/22 Office Visit Edward Jolly, MD [facility]  Showing recent visits within past 90 days and meeting all other requirements Today's Visits Date Type Provider Dept  07/10/22 Procedure visit Edward Jolly, MD [facility]  Showing today's visits and meeting all other requirements Future Appointments Date Type Provider Dept  09/03/22 Appointment Edward Jolly, MD [facility]  Showing future appointments within next 90 days and meeting all other requirements  Disposition: Discharge home  Discharge (Date  Time): 07/10/2022; 1040 hrs.   Primary Care Physician: Dan Humphreys, MD Location: [facility] Note by: Edward Jolly, MD (TTS technology used. I apologize for any typographical errors that were not detected and corrected.) Date: 07/10/2022; Time: 10:41 AM  Disclaimer:  Medicine is not an Visual merchandiser. The only guarantee in medicine is that nothing is guaranteed. It is important to note that the decision to proceed with this intervention was based on the information collected from the patient. The Data and conclusions were drawn from the patient's questionnaire, the interview, and the physical examination. Because the information was provided in large part by the patient, it cannot be guaranteed that it has not been purposely or unconsciously manipulated. Every effort has been made to obtain as much relevant data as possible for this evaluation. It is important to note that the conclusions that lead to this procedure are derived in large part from the available data. Always take into account that the treatment will also be dependent on availability of resources and existing treatment guidelines, considered by other Pain Management Practitioners as being common knowledge and practice, at the time of the  intervention. For Medico-Legal purposes, it is also important to point out that variation in procedural techniques and pharmacological choices are the acceptable norm. The indications, contraindications, technique, and results of the above procedure should only be interpreted and judged by a Board-Certified Interventional Pain Specialist with extensive familiarity and expertise  in the same exact procedure and technique.

## 2022-07-10 NOTE — Progress Notes (Signed)
Safety precautions to be maintained throughout the outpatient stay will include: orient to surroundings, keep bed in low position, maintain call bell within reach at all times, provide assistance with transfer out of bed and ambulation.  

## 2022-07-11 ENCOUNTER — Telehealth: Payer: Self-pay | Admitting: *Deleted

## 2022-07-11 NOTE — Telephone Encounter (Signed)
Called for post procedure check. Denies any post procedure issues. 

## 2022-08-22 ENCOUNTER — Other Ambulatory Visit: Payer: Self-pay | Admitting: *Deleted

## 2022-08-22 ENCOUNTER — Telehealth: Payer: Self-pay | Admitting: Student in an Organized Health Care Education/Training Program

## 2022-08-22 DIAGNOSIS — M1711 Unilateral primary osteoarthritis, right knee: Secondary | ICD-10-CM

## 2022-08-22 DIAGNOSIS — M7918 Myalgia, other site: Secondary | ICD-10-CM

## 2022-08-22 DIAGNOSIS — F1190 Opioid use, unspecified, uncomplicated: Secondary | ICD-10-CM

## 2022-08-22 DIAGNOSIS — G894 Chronic pain syndrome: Secondary | ICD-10-CM

## 2022-08-22 DIAGNOSIS — M5416 Radiculopathy, lumbar region: Secondary | ICD-10-CM

## 2022-08-22 DIAGNOSIS — M1712 Unilateral primary osteoarthritis, left knee: Secondary | ICD-10-CM

## 2022-08-22 NOTE — Telephone Encounter (Signed)
Medication refill request sent to [facility] to address on Monday and patient is aware.

## 2022-08-22 NOTE — Telephone Encounter (Signed)
Patient needs new scripts sent to [facility] in [place] her other [facility] is closing.

## 2022-08-26 MED ORDER — HYDROCODONE-ACETAMINOPHEN 7.5-325 MG PO TABS: 1.0000 | ORAL_TABLET | Freq: Three times a day (TID) | ORAL | 0 refills | Status: DC | PRN

## 2022-09-03 ENCOUNTER — Ambulatory Visit
Payer: Medicaid Other | Attending: Student in an Organized Health Care Education/Training Program | Admitting: Student in an Organized Health Care Education/Training Program

## 2022-09-03 ENCOUNTER — Encounter: Payer: Self-pay | Admitting: Student in an Organized Health Care Education/Training Program

## 2022-09-03 VITALS — BP 132/83 | HR 85 | Temp 97.0°F | Ht 59.0 in | Wt 107.0 lb

## 2022-09-03 DIAGNOSIS — M25512 Pain in left shoulder: Secondary | ICD-10-CM | POA: Diagnosis not present

## 2022-09-03 DIAGNOSIS — G894 Chronic pain syndrome: Secondary | ICD-10-CM | POA: Diagnosis present

## 2022-09-03 DIAGNOSIS — M1712 Unilateral primary osteoarthritis, left knee: Secondary | ICD-10-CM | POA: Diagnosis present

## 2022-09-03 DIAGNOSIS — M5416 Radiculopathy, lumbar region: Secondary | ICD-10-CM | POA: Insufficient documentation

## 2022-09-03 DIAGNOSIS — M7918 Myalgia, other site: Secondary | ICD-10-CM | POA: Insufficient documentation

## 2022-09-03 DIAGNOSIS — M1711 Unilateral primary osteoarthritis, right knee: Secondary | ICD-10-CM | POA: Diagnosis present

## 2022-09-03 DIAGNOSIS — F1190 Opioid use, unspecified, uncomplicated: Secondary | ICD-10-CM | POA: Diagnosis present

## 2022-09-03 DIAGNOSIS — M19012 Primary osteoarthritis, left shoulder: Secondary | ICD-10-CM | POA: Insufficient documentation

## 2022-09-03 DIAGNOSIS — M797 Fibromyalgia: Secondary | ICD-10-CM | POA: Diagnosis not present

## 2022-09-03 DIAGNOSIS — G8929 Other chronic pain: Secondary | ICD-10-CM | POA: Insufficient documentation

## 2022-09-03 DIAGNOSIS — M170 Bilateral primary osteoarthritis of knee: Secondary | ICD-10-CM

## 2022-09-03 MED ORDER — HYDROCODONE-ACETAMINOPHEN 7.5-325 MG PO TABS
1.0000 | ORAL_TABLET | Freq: Three times a day (TID) | ORAL | 0 refills | Status: AC | PRN
Start: 2022-09-24 — End: 2022-10-24

## 2022-09-03 MED ORDER — HYDROCODONE-ACETAMINOPHEN 7.5-325 MG PO TABS
1.0000 | ORAL_TABLET | Freq: Three times a day (TID) | ORAL | 0 refills | Status: DC | PRN
Start: 2022-11-23 — End: 2022-12-17

## 2022-09-03 MED ORDER — HYDROCODONE-ACETAMINOPHEN 7.5-325 MG PO TABS
1.0000 | ORAL_TABLET | Freq: Three times a day (TID) | ORAL | 0 refills | Status: AC | PRN
Start: 2022-10-24 — End: 2022-11-23

## 2022-09-03 MED ORDER — CELECOXIB 100 MG PO CAPS
100.0000 mg | ORAL_CAPSULE | Freq: Every day | ORAL | 0 refills | Status: AC
Start: 2022-09-03 — End: 2022-10-03

## 2022-09-03 MED ORDER — PREGABALIN 100 MG PO CAPS
100.0000 mg | ORAL_CAPSULE | Freq: Three times a day (TID) | ORAL | 5 refills | Status: DC
Start: 2022-09-03 — End: 2022-12-17

## 2022-09-03 NOTE — Progress Notes (Signed)
PROVIDER NOTE: Information contained herein reflects review and annotations entered in association with encounter. Interpretation of such information and data should be left to medically-trained personnel. Information provided to patient can be located elsewhere in the medical record under "Patient Instructions". Document created using STT-dictation technology, any transcriptional errors that may result from process are unintentional.    Patient: [name]  Service Category: E/M  Provider: Edward Jolly, MD  DOB: 01/13/1961  DOS: 09/03/2022  Specialty: Interventional Pain Management  MRN: [id number]  Setting: Ambulatory outpatient  PCP: Erika Humphreys, MD  Type: Established Patient    Referring Provider: Dan Humphreys, MD  Location: Office  Delivery: Face-to-face     HPI  [name], a 62 y.o. year old female, is here today because of her Chronic left shoulder pain [M25.512, G89.29]. [name]'s primary complain today is Other (Generalized all over)  Last encounter: My last encounter with her was on 07/10/2022 Pertinent problems: [name] has Lumbar radiculopathy; Chronic bilateral low back pain with bilateral sciatica; Chronic pain syndrome; Chronic, continuous use of opioids; Chronic myofascial pain; Primary osteoarthritis of both hips; Type 2 diabetes mellitus without complications (HCC); and Pain in both hands on their pertinent problem list. Pain Assessment: Severity of Chronic pain is reported as a 8 /10. Location: Generalized  /denies. Onset: More than a month ago. Quality: Constant. Timing: Constant. Modifying factor(s): meds. Vitals:  height is 4\' 11"  (1.499 m) and weight is 107 lb (48.5 kg). Her temporal temperature is 97 F (36.1 C) (abnormal). Her blood pressure is 132/83 and her pulse is 85. Her oxygen saturation is 100%.   Reason for encounter:   Continues to struggle with left shoulder pain and ROM after her fall last year.  She had a bilateral  shoulder joint injection done 07/10/2022 that helped out with her right shoulder but not her left. Discussed left suprascapular nerve block for persistent left shoulder pain. No benefit with Qutenza for painful diabetic neuropathy She is on insulin twice daily along with metformin Finds benefit with Lyrica 100 mg 3 times a day.  Creatinine function within normal limits. UDS up-to-date and appropriate   Pharmacotherapy Assessment  Analgesic: Norco 7.5 mg TID PRN #90/month   Monitoring: [place] PMP: PDMP reviewed during this encounter.       Pharmacotherapy: No side-effects or adverse reactions reported. Compliance: No problems identified. Effectiveness: Clinically acceptable.  UDS:  Summary  Date Value Ref Range Status  12/13/2021 Note  Final    Comment:    ==================================================================== [employer] [facility] (MW) ==================================================================== Test                             Result       Flag       Units  Drug Present and Declared for Prescription Verification   Hydrocodone                    4177         EXPECTED   ng/mg creat   Hydromorphone                  816          EXPECTED   ng/mg creat   Dihydrocodeine                 338          EXPECTED   ng/mg creat   Norhydrocodone  1610         EXPECTED   ng/mg creat    Sources of hydrocodone include scheduled prescription medications.    Hydromorphone, dihydrocodeine and norhydrocodone are expected    metabolites of hydrocodone. Hydromorphone and dihydrocodeine are    also available as scheduled prescription medications.  ==================================================================== Test                      Result    Flag   Units      Ref Range   Creatinine              77               mg/dL      >=96 ==================================================================== Declared Medications:  The flagging and interpretation on this  report are based on the  following declared medications.  Unexpected results may arise from  inaccuracies in the declared medications.   **Note: The testing scope of this panel includes these medications:   Hydrocodone (Norco)   **Note: The testing scope of this panel does not include the  following reported medications:   Acetaminophen (Norco)  Albuterol (Ventolin HFA)  Aripiprazole (Abilify)  Aspirin  Budesonide (Symbicort)  Cetirizine (Zyrtec)  Famotidine (Pepcid)  Fluticasone (Flonase)  Formoterol (Symbicort)  Insulin (NovoLog)  Iron  Lidocaine  Linaclotide (Linzess)  Losartan (Cozaar)  Meclizine (Antivert)  Metformin  Mirtazapine (Remeron)  Montelukast (Singulair)  Multivitamin  Nicotine  Ondansetron (Zofran)  Pregabalin (Lyrica)  Propranolol (Inderal)  Rizatriptan (Maxalt)  Rosuvastatin (Crestor)  Solifenacin (Vesicare)  Tiotropium (Spiriva)  Topiramate (Topamax)  Trazodone (Desyrel)  Venlafaxine (Effexor)  Vitamin D2 ==================================================================== For clinical consultation, please call [phone]. ====================================================================         ROS  Constitutional: Denies any fever or chills Gastrointestinal: No reported hemesis, hematochezia, vomiting, or acute GI distress Musculoskeletal:  Low back pain, left shoulder pain Neurological:  bilateral feet parasthesias  Medication Review  ARIPiprazole, Accu-Chek Guide, Cysteamine Bitartrate, Dulaglutide, HYDROcodone-acetaminophen, Insulin Pen Needle, albuterol, aspirin, budesonide-formoterol, celecoxib, cetirizine, docusate sodium, famotidine, fluticasone, glipiZIDE, glucose blood, insulin aspart protamine - aspart, lidocaine, linaclotide, losartan, magnesium oxide, meclizine, metFORMIN, mirtazapine, montelukast, omeprazole, ondansetron, pregabalin, propranolol, rizatriptan, rosuvastatin, solifenacin, topiramate, traZODone, and  venlafaxine XR  History Review  Allergy: [name] is allergic to tramadol, benadryl [diphenhydramine hcl], bextra [valdecoxib], invokana [canagliflozin], and sulfamethoxazole-trimethoprim. Drug: [name]  reports that she does not currently use drugs. Alcohol:  reports current alcohol use of about 2.0 standard drinks of alcohol per week. Tobacco:  reports that she has quit smoking. Her smoking use included cigarettes. She has a 36.00 pack-year smoking history. She has been exposed to tobacco smoke. She has quit using smokeless tobacco. Social: Ms. Daywalt  reports that she has quit smoking. Her smoking use included cigarettes. She has a 36.00 pack-year smoking history. She has been exposed to tobacco smoke. She has quit using smokeless tobacco. She reports current alcohol use of about 2.0 standard drinks of alcohol per week. She reports that she does not currently use drugs. Medical:  has a past medical history of Abnormal CBC, Asthma, Asthma, BPPV (benign paroxysmal positional vertigo), Cardiac murmur, unspecified (08/28/2016), COPD (chronic obstructive pulmonary disease) (HCC), CPAP (continuous positive airway pressure) dependence, Degenerative disc disease, lumbar, Depression, Diabetes mellitus, Diverticulitis (10/2016), Elevated LFTs, Fatty liver, Fibromyalgia, Fibromyalgia, GERD (gastroesophageal reflux disease), Headache, History of degenerative disc disease, Hyperlipidemia, Hypertension, and Sleep apnea. Surgical: Ms. Naeve  has a past surgical history that includes Abdominal surgery; Abdominal hysterectomy;  Esophagogastroduodenoscopy (egd) with propofol (N/A, 02/20/2017); Colonoscopy with propofol (N/A, 02/20/2017); Upper esophageal endoscopic ultrasound (eus) (N/A, 04/03/2017); Diverticulitis; EUS (N/A, 01/08/2018); Cholecystectomy (N/A, 04/27/2018); Cataract extraction w/PHACO (Left, 09/07/2019); Cataract extraction w/PHACO (Right, 09/28/2019); and RIGHT/LEFT HEART CATH AND  CORONARY ANGIOGRAPHY (Bilateral, 12/19/2021). Family: family history includes Diabetes in her mother; Hypertension in her mother.  Laboratory Chemistry Profile   Renal Lab Results  Component Value Date   BUN 20 04/19/2022   CREATININE 0.52 04/19/2022   BCR 14 11/27/2017   GFRAA >60 08/09/2019   GFRNONAA >60 04/19/2022     Hepatic Lab Results  Component Value Date   AST 10 (L) 04/19/2022   ALT 36 04/19/2022   ALBUMIN 3.9 04/19/2022   ALKPHOS 138 (H) 04/19/2022   LIPASE 47 04/19/2022   AMMONIA 46 (H) 04/05/2021     Electrolytes Lab Results  Component Value Date   NA 138 04/19/2022   K 3.7 04/19/2022   CL 107 04/19/2022   CALCIUM 9.8 04/19/2022   MG 1.4 (L) 04/19/2022     Bone No results found for: "VD25OH", "VD125OH2TOT", "ZO1096EA5", "WU9811BJ4", "25OHVITD1", "25OHVITD2", "25OHVITD3", "TESTOFREE", "TESTOSTERONE"   Inflammation (CRP: Acute Phase) (ESR: Chronic Phase) Lab Results  Component Value Date   CRP <1 11/27/2017   ESRSEDRATE 17 11/27/2017   LATICACIDVEN 1.3 04/05/2021       Note: Above Lab results reviewed.  Recent Imaging Review  DG PAIN CLINIC C-ARM 1-60 MIN NO REPORT Fluoro was used, but no Radiologist interpretation will be provided.  Please refer to "NOTES" tab for provider progress note.  Note: Reviewed        Physical Exam  General appearance: Well nourished, well developed, and well hydrated. In no apparent acute distress Mental status: Alert, oriented x 3 (person, place, & time)       Respiratory: No evidence of acute respiratory distress Eyes: PERLA Vitals: BP 132/83   Pulse 85   Temp (!) 97 F (36.1 C) (Temporal)   Ht 4\' 11"  (1.499 m)   Wt 107 lb (48.5 kg)   LMP  (LMP Unknown) Comment: "many years"  SpO2 100%   BMI 21.61 kg/m  BMI: Estimated body mass index is 21.61 kg/m as calculated from the following:   Height as of this encounter: 4\' 11"  (1.499 m).   Weight as of this encounter: 107 lb (48.5 kg). Ideal: Patient must be at  least 60 in tall to calculate ideal body weight  Cervical Spine Area Exam  Skin & Axial Inspection: No masses, redness, edema, swelling, or associated skin lesions Alignment: Symmetrical Functional ROM: Unrestricted ROM      Stability: No instability detected Muscle Tone/Strength: Functionally intact. No obvious neuro-muscular anomalies detected. Sensory (Neurological): Musculoskeletal pain pattern, left Palpation: No palpable anomalies             Upper Extremity (UE) Exam    Side: Right upper extremity  Side: Left upper extremity  Skin & Extremity Inspection: Skin color, temperature, and hair growth are WNL. No peripheral edema or cyanosis. No masses, redness, swelling, asymmetry, or associated skin lesions. No contractures.  Skin & Extremity Inspection: Skin color, temperature, and hair growth are WNL. No peripheral edema or cyanosis. No masses, redness, swelling, asymmetry, or associated skin lesions. No contractures.  Functional ROM: Unrestricted ROM          Functional ROM: Pain restricted ROM for shoulder  Muscle Tone/Strength: Functionally intact. No obvious neuro-muscular anomalies detected.  Muscle Tone/Strength: Functionally intact. No obvious neuro-muscular anomalies detected.  Sensory (  Neurological): Unimpaired          Sensory (Neurological): Arthropathic arthralgia          Palpation: No palpable anomalies              Palpation: No palpable anomalies              Provocative Test(s):  Phalen's test: deferred Tinel's test: deferred Apley's scratch test (touch opposite shoulder):  Action 1 (Across chest): deferred Action 2 (Overhead): deferred Action 3 (LB reach): deferred   Provocative Test(s):  Phalen's test: deferred Tinel's test: deferred Apley's scratch test (touch opposite shoulder):  Action 1 (Across chest): Decreased ROM Action 2 (Overhead): Decreased ROM Action 3 (LB reach): Decreased ROM     Assessment   Status Diagnosis  Persistent Persistent Persistent  1. Chronic left shoulder pain   2. Primary osteoarthritis of left shoulder   3. Arthritis of left acromioclavicular joint   4. Fibromyalgia   5. Lumbar radiculopathy   6. Chronic pain syndrome   7. Arthritis of left knee   8. Primary osteoarthritis of right knee   9. Chronic myofascial pain   10. Chronic, continuous use of opioids        Plan of Care  Ms. Eztli Deso Alton has a current medication list which includes the following long-term medication(s): albuterol, aripiprazole, budesonide-formoterol, famotidine, insulin aspart protamine - aspart, losartan, metformin, mirtazapine, omeprazole, propranolol, rizatriptan, topiramate, trazodone, venlafaxine xr, and pregabalin.  1. Chronic left shoulder pain - celecoxib (CELEBREX) 100 MG capsule; Take 1 capsule (100 mg total) by mouth daily.  Dispense: 30 capsule; Refill: 0  2. Primary osteoarthritis of left shoulder - celecoxib (CELEBREX) 100 MG capsule; Take 1 capsule (100 mg total) by mouth daily.  Dispense: 30 capsule; Refill: 0 - SUPRASCAPULAR NERVE BLOCK; Future  3. Arthritis of left acromioclavicular joint - SUPRASCAPULAR NERVE BLOCK; Future  4. Fibromyalgia  5. Lumbar radiculopathy - HYDROcodone-acetaminophen (NORCO) 7.5-325 MG tablet; Take 1 tablet by mouth 3 (three) times daily as needed for severe pain. Must last 30 days  Dispense: 90 tablet; Refill: 0 - HYDROcodone-acetaminophen (NORCO) 7.5-325 MG tablet; Take 1 tablet by mouth 3 (three) times daily as needed for severe pain. Must last 30 days  Dispense: 90 tablet; Refill: 0 - HYDROcodone-acetaminophen (NORCO) 7.5-325 MG tablet; Take 1 tablet by mouth 3 (three) times daily as needed for severe pain. Must last 30 days  Dispense: 90 tablet; Refill: 0 - pregabalin (LYRICA) 100 MG capsule; Take 1 capsule (100 mg total) by mouth 3 (three) times daily.  Dispense: 90 capsule; Refill: 5  6. Chronic pain syndrome - HYDROcodone-acetaminophen (NORCO) 7.5-325 MG tablet; Take 1  tablet by mouth 3 (three) times daily as needed for severe pain. Must last 30 days  Dispense: 90 tablet; Refill: 0 - HYDROcodone-acetaminophen (NORCO) 7.5-325 MG tablet; Take 1 tablet by mouth 3 (three) times daily as needed for severe pain. Must last 30 days  Dispense: 90 tablet; Refill: 0 - HYDROcodone-acetaminophen (NORCO) 7.5-325 MG tablet; Take 1 tablet by mouth 3 (three) times daily as needed for severe pain. Must last 30 days  Dispense: 90 tablet; Refill: 0 - pregabalin (LYRICA) 100 MG capsule; Take 1 capsule (100 mg total) by mouth 3 (three) times daily.  Dispense: 90 capsule; Refill: 5  7. Arthritis of left knee - HYDROcodone-acetaminophen (NORCO) 7.5-325 MG tablet; Take 1 tablet by mouth 3 (three) times daily as needed for severe pain. Must last 30 days  Dispense: 90 tablet; Refill: 0 - HYDROcodone-acetaminophen (  NORCO) 7.5-325 MG tablet; Take 1 tablet by mouth 3 (three) times daily as needed for severe pain. Must last 30 days  Dispense: 90 tablet; Refill: 0 - HYDROcodone-acetaminophen (NORCO) 7.5-325 MG tablet; Take 1 tablet by mouth 3 (three) times daily as needed for severe pain. Must last 30 days  Dispense: 90 tablet; Refill: 0  8. Primary osteoarthritis of right knee - HYDROcodone-acetaminophen (NORCO) 7.5-325 MG tablet; Take 1 tablet by mouth 3 (three) times daily as needed for severe pain. Must last 30 days  Dispense: 90 tablet; Refill: 0 - HYDROcodone-acetaminophen (NORCO) 7.5-325 MG tablet; Take 1 tablet by mouth 3 (three) times daily as needed for severe pain. Must last 30 days  Dispense: 90 tablet; Refill: 0 - HYDROcodone-acetaminophen (NORCO) 7.5-325 MG tablet; Take 1 tablet by mouth 3 (three) times daily as needed for severe pain. Must last 30 days  Dispense: 90 tablet; Refill: 0  9. Chronic myofascial pain - HYDROcodone-acetaminophen (NORCO) 7.5-325 MG tablet; Take 1 tablet by mouth 3 (three) times daily as needed for severe pain. Must last 30 days  Dispense: 90 tablet;  Refill: 0 - HYDROcodone-acetaminophen (NORCO) 7.5-325 MG tablet; Take 1 tablet by mouth 3 (three) times daily as needed for severe pain. Must last 30 days  Dispense: 90 tablet; Refill: 0 - HYDROcodone-acetaminophen (NORCO) 7.5-325 MG tablet; Take 1 tablet by mouth 3 (three) times daily as needed for severe pain. Must last 30 days  Dispense: 90 tablet; Refill: 0 - pregabalin (LYRICA) 100 MG capsule; Take 1 capsule (100 mg total) by mouth 3 (three) times daily.  Dispense: 90 capsule; Refill: 5  10. Chronic, continuous use of opioids - HYDROcodone-acetaminophen (NORCO) 7.5-325 MG tablet; Take 1 tablet by mouth 3 (three) times daily as needed for severe pain. Must last 30 days  Dispense: 90 tablet; Refill: 0 - HYDROcodone-acetaminophen (NORCO) 7.5-325 MG tablet; Take 1 tablet by mouth 3 (three) times daily as needed for severe pain. Must last 30 days  Dispense: 90 tablet; Refill: 0 - HYDROcodone-acetaminophen (NORCO) 7.5-325 MG tablet; Take 1 tablet by mouth 3 (three) times daily as needed for severe pain. Must last 30 days  Dispense: 90 tablet; Refill: 0  Pharmacotherapy (Medications Ordered): Meds ordered this encounter  Medications   HYDROcodone-acetaminophen (NORCO) 7.5-325 MG tablet    Sig: Take 1 tablet by mouth 3 (three) times daily as needed for severe pain. Must last 30 days    Dispense:  90 tablet    Refill:  0    Chronic Pain: STOP Act (Not applicable) Fill 1 day early if closed on refill date. Avoid benzodiazepines within 8 hours of opioids   HYDROcodone-acetaminophen (NORCO) 7.5-325 MG tablet    Sig: Take 1 tablet by mouth 3 (three) times daily as needed for severe pain. Must last 30 days    Dispense:  90 tablet    Refill:  0    Chronic Pain: STOP Act (Not applicable) Fill 1 day early if closed on refill date. Avoid benzodiazepines within 8 hours of opioids   HYDROcodone-acetaminophen (NORCO) 7.5-325 MG tablet    Sig: Take 1 tablet by mouth 3 (three) times daily as needed for severe  pain. Must last 30 days    Dispense:  90 tablet    Refill:  0    Chronic Pain: STOP Act (Not applicable) Fill 1 day early if closed on refill date. Avoid benzodiazepines within 8 hours of opioids   pregabalin (LYRICA) 100 MG capsule    Sig: Take 1 capsule (100 mg  total) by mouth 3 (three) times daily.    Dispense:  90 capsule    Refill:  5   celecoxib (CELEBREX) 100 MG capsule    Sig: Take 1 capsule (100 mg total) by mouth daily.    Dispense:  30 capsule    Refill:  0    Follow-up plan:   Return in about 20 days (around 09/23/2022) for left SSNB, in clinic [facility].   Recent Visits Date Type Provider Dept  07/10/22 Procedure visit Erika Jolly, MD [facility]  06/13/22 Office Visit Erika Jolly, MD [facility]  Showing recent visits within past 90 days and meeting all other requirements Today's Visits Date Type Provider Dept  09/03/22 Office Visit Erika Jolly, MD [facility]  Showing today's visits and meeting all other requirements Future Appointments Date Type Provider Dept  09/23/22 Appointment Erika Jolly, MD [facility]  Showing future appointments within next 90 days and meeting all other requirements  I discussed the assessment and treatment plan with the patient. The patient was provided an opportunity to ask questions and all were answered. The patient agreed with the plan and demonstrated an understanding of the instructions.  Patient advised to call back or seek an in-person evaluation if the symptoms or condition worsens.  Duration of encounter: 30 minutes.  Note by: Erika Jolly, MD Date: 09/03/2022; Time: 11:10 AM

## 2022-09-03 NOTE — Patient Instructions (Signed)
GENERAL RISKS AND COMPLICATIONS  What are the risk, side effects and possible complications? Generally speaking, most procedures are safe.  However, with any procedure there are risks, side effects, and the possibility of complications.  The risks and complications are dependent upon the sites that are lesioned, or the type of nerve block to be performed.  The closer the procedure is to the spine, the more serious the risks are.  Great care is taken when placing the radio frequency needles, block needles or lesioning probes, but sometimes complications can occur. Infection: Any time there is an injection through the skin, there is a risk of infection.  This is why sterile conditions are used for these blocks.  There are four possible types of infection. Localized skin infection. Central Nervous System Infection-This can be in the form of Meningitis, which can be deadly. Epidural Infections-This can be in the form of an epidural abscess, which can cause pressure inside of the spine, causing compression of the spinal cord with subsequent paralysis. This would require an emergency surgery to decompress, and there are no guarantees that the patient would recover from the paralysis. Discitis-This is an infection of the intervertebral discs.  It occurs in about 1% of discography procedures.  It is difficult to treat and it may lead to surgery.        2. Pain: the needles have to go through skin and soft tissues, will cause soreness.       3. Damage to internal structures:  The nerves to be lesioned may be near blood vessels or    other nerves which can be potentially damaged.       4. Bleeding: Bleeding is more common if the patient is taking blood thinners such as  aspirin, Coumadin, Ticiid, Plavix, etc., or if he/she have some genetic predisposition  such as hemophilia. Bleeding into the spinal canal can cause compression of the spinal  cord with subsequent paralysis.  This would require an emergency  surgery to  decompress and there are no guarantees that the patient would recover from the  paralysis.       5. Pneumothorax:  Puncturing of a lung is a possibility, every time a needle is introduced in  the area of the chest or upper back.  Pneumothorax refers to free air around the  collapsed lung(s), inside of the thoracic cavity (chest cavity).  Another two possible  complications related to a similar event would include: Hemothorax and Chylothorax.   These are variations of the Pneumothorax, where instead of air around the collapsed  lung(s), you may have blood or chyle, respectively.       6. Spinal headaches: They may occur with any procedures in the area of the spine.       7. Persistent CSF (Cerebro-Spinal Fluid) leakage: This is a rare problem, but may occur  with prolonged intrathecal or epidural catheters either due to the formation of a fistulous  track or a dural tear.       8. Nerve damage: By working so close to the spinal cord, there is always a possibility of  nerve damage, which could be as serious as a permanent spinal cord injury with  paralysis.       9. Death:  Although rare, severe deadly allergic reactions known as "Anaphylactic  reaction" can occur to any of the medications used.      10. Worsening of the symptoms:  We can always make thing worse.  What are the chances   of something like this happening? Chances of any of this occuring are extremely low.  By statistics, you have more of a chance of getting killed in a motor vehicle accident: while driving to the hospital than any of the above occurring .  Nevertheless, you should be aware that they are possibilities.  In general, it is similar to taking a shower.  Everybody knows that you can slip, hit your head and get killed.  Does that mean that you should not shower again?  Nevertheless always keep in mind that statistics do not mean anything if you happen to be on the wrong side of them.  Even if a procedure has a 1 (one) in a  1,000,000 (million) chance of going wrong, it you happen to be that one..Also, keep in mind that by statistics, you have more of a chance of having something go wrong when taking medications.  Who should not have this procedure? If you are on a blood thinning medication (e.g. Coumadin, Plavix, see list of "Blood Thinners"), or if you have an active infection going on, you should not have the procedure.  If you are taking any blood thinners, please inform your physician.  How should I prepare for this procedure? Do not eat or drink anything at least six hours prior to the procedure. Bring a driver with you .  It cannot be a taxi. Come accompanied by an adult that can drive you back, and that is strong enough to help you if your legs get weak or numb from the local anesthetic. Take all of your medicines the morning of the procedure with just enough water to swallow them. If you have diabetes, make sure that you are scheduled to have your procedure done first thing in the morning, whenever possible. If you have diabetes, take only half of your insulin dose and notify our nurse that you have done so as soon as you arrive at the clinic. If you are diabetic, but only take blood sugar pills (oral hypoglycemic), then do not take them on the morning of your procedure.  You may take them after you have had the procedure. Do not take aspirin or any aspirin-containing medications, at least eleven (11) days prior to the procedure.  They may prolong bleeding. Wear loose fitting clothing that may be easy to take off and that you would not mind if it got stained with Betadine or blood. Do not wear any jewelry or perfume Remove any nail coloring.  It will interfere with some of our monitoring equipment.  NOTE: Remember that this is not meant to be interpreted as a complete list of all possible complications.  Unforeseen problems may occur.  BLOOD THINNERS The following drugs contain aspirin or other products,  which can cause increased bleeding during surgery and should not be taken for 2 weeks prior to and 1 week after surgery.  If you should need take something for relief of minor pain, you may take acetaminophen which is found in Tylenol,m Datril, Anacin-3 and Panadol. It is not blood thinner. The products listed below are.  Do not take any of the products listed below in addition to any listed on your instruction sheet.  A.P.C or A.P.C with Codeine Codeine Phosphate Capsules #3 Ibuprofen Ridaura  ABC compound Congesprin Imuran rimadil  Advil Cope Indocin Robaxisal  Alka-Seltzer Effervescent Pain Reliever and Antacid Coricidin or Coricidin-D  Indomethacin Rufen  Alka-Seltzer plus Cold Medicine Cosprin Ketoprofen S-A-C Tablets  Anacin Analgesic Tablets or Capsules Coumadin   Korlgesic Salflex  Anacin Extra Strength Analgesic tablets or capsules CP-2 Tablets Lanoril Salicylate  Anaprox Cuprimine Capsules Levenox Salocol  Anexsia-D Dalteparin Magan Salsalate  Anodynos Darvon compound Magnesium Salicylate Sine-off  Ansaid Dasin Capsules Magsal Sodium Salicylate  Anturane Depen Capsules Marnal Soma  APF Arthritis pain formula Dewitt's Pills Measurin Stanback  Argesic Dia-Gesic Meclofenamic Sulfinpyrazone  Arthritis Bayer Timed Release Aspirin Diclofenac Meclomen Sulindac  Arthritis pain formula Anacin Dicumarol Medipren Supac  Analgesic (Safety coated) Arthralgen Diffunasal Mefanamic Suprofen  Arthritis Strength Bufferin Dihydrocodeine Mepro Compound Suprol  Arthropan liquid Dopirydamole Methcarbomol with Aspirin Synalgos  ASA tablets/Enseals Disalcid Micrainin Tagament  Ascriptin Doan's Midol Talwin  Ascriptin A/D Dolene Mobidin Tanderil  Ascriptin Extra Strength Dolobid Moblgesic Ticlid  Ascriptin with Codeine Doloprin or Doloprin with Codeine Momentum Tolectin  Asperbuf Duoprin Mono-gesic Trendar  Aspergum Duradyne Motrin or Motrin IB Triminicin  Aspirin plain, buffered or enteric coated  Durasal Myochrisine Trigesic  Aspirin Suppositories Easprin Nalfon Trillsate  Aspirin with Codeine Ecotrin Regular or Extra Strength Naprosyn Uracel  Atromid-S Efficin Naproxen Ursinus  Auranofin Capsules Elmiron Neocylate Vanquish  Axotal Emagrin Norgesic Verin  Azathioprine Empirin or Empirin with Codeine Normiflo Vitamin E  Azolid Emprazil Nuprin Voltaren  Bayer Aspirin plain, buffered or children's or timed BC Tablets or powders Encaprin Orgaran Warfarin Sodium  Buff-a-Comp Enoxaparin Orudis Zorpin  Buff-a-Comp with Codeine Equegesic Os-Cal-Gesic   Buffaprin Excedrin plain, buffered or Extra Strength Oxalid   Bufferin Arthritis Strength Feldene Oxphenbutazone   Bufferin plain or Extra Strength Feldene Capsules Oxycodone with Aspirin   Bufferin with Codeine Fenoprofen Fenoprofen Pabalate or Pabalate-SF   Buffets II Flogesic Panagesic   Buffinol plain or Extra Strength Florinal or Florinal with Codeine Panwarfarin   Buf-Tabs Flurbiprofen Penicillamine   Butalbital Compound Four-way cold tablets Penicillin   Butazolidin Fragmin Pepto-Bismol   Carbenicillin Geminisyn Percodan   Carna Arthritis Reliever Geopen Persantine   Carprofen Gold's salt Persistin   Chloramphenicol Goody's Phenylbutazone   Chloromycetin Haltrain Piroxlcam   Clmetidine heparin Plaquenil   Cllnoril Hyco-pap Ponstel   Clofibrate Hydroxy chloroquine Propoxyphen         Before stopping any of these medications, be sure to consult the physician who ordered them.  Some, such as Coumadin (Warfarin) are ordered to prevent or treat serious conditions such as "deep thrombosis", "pumonary embolisms", and other heart problems.  The amount of time that you may need off of the medication may also vary with the medication and the reason for which you were taking it.  If you are taking any of these medications, please make sure you notify your pain physician before you undergo any procedures.         Selective Nerve Root  Block Patient Information  Description: Specific nerve roots exit the spinal canal and these nerves can be compressed and inflamed by a bulging disc and bone spurs.  By injecting steroids on the nerve root, we can potentially decrease the inflammation surrounding these nerves, which often leads to decreased pain.  Also, by injecting local anesthesia on the nerve root, this can provide us helpful information to give to your referring doctor if it decreases your pain.  Selective nerve root blocks can be done along the spine from the neck to the low back depending on the location of your pain.   After numbing the skin with local anesthesia, a small needle is passed to the nerve root and the position of the needle is verified using x-ray pictures.  After the needle is   in correct position, we then deposit the medication.  You may experience a pressure sensation while this is being done.  The entire block usually lasts less than 15 minutes.  Conditions that may be treated with selective nerve root blocks: Low back and leg pain Spinal stenosis Diagnostic block prior to potential surgery Neck and arm pain Post laminectomy syndrome  Preparation for the injection:  Do not eat any solid food or dairy products within 8 hours of your appointment. You may drink clear liquids up to 3 hours before an appointment.  Clear liquids include water, black coffee, juice or soda.  No milk or cream please. You may take your regular medications, including pain medications, with a sip of water before your appointment.  Diabetics should hold regular insulin (if taken separately) and take 1/2 normal NPH dose the morning of the procedure.  Carry some sugar containing items with you to your appointment. A driver must accompany you and be prepared to drive you home after your procedure. Bring all your current medications with you. An IV may be inserted and sedation may be given at the discretion of the physician. A blood  pressure cuff, EKG, and other monitors will often be applied during the procedure.  Some patients may need to have extra oxygen administered for a short period. You will be asked to provide medical information, including allergies, prior to the procedure.  We must know immediately if you are taking blood  Thinners (like Coumadin) or if you are allergic to IV iodine contrast (dye).  Possible side-effects: All are usually temporary Bleeding from needle site Light headedness Numbness and tingling Decreased blood pressure Weakness in arms/legs Pressure sensation in back/neck Pain at injection site (several days)  Possible complications: All are extremely rare Infection Nerve injury Spinal headache (a headache wore with upright position)  Call if you experience: Fever/chills associated with headache or increased back/neck pain Headache worsened by an upright position New onset weakness or numbness of an extremity below the injection site Hives or difficulty breathing (go to the emergency room) Inflammation or drainage at the injection site(s) Severe back/neck pain greater than usual New symptoms which are concerning to you  Please note:  Although the local anesthetic injected can often make your back or neck feel good for several hours after the injection the pain will likely return.  It takes 3-5 days for steroids to work on the nerve root. You may not notice any pain relief for at least one week.  If effective, we will often do a series of 3 injections spaced 3-6 weeks apart to maximally decrease your pain.    If you have any questions, please call [phone] [facility] 

## 2022-09-03 NOTE — Progress Notes (Signed)
Nursing Pain Medication Assessment:  Safety precautions to be maintained throughout the outpatient stay will include: orient to surroundings, keep bed in low position, maintain call bell within reach at all times, provide assistance with transfer out of bed and ambulation.  Medication Inspection Compliance: Pill count conducted under aseptic conditions, in front of the patient. Neither the pills nor the bottle was removed from the patient's sight at any time. Once count was completed pills were immediately returned to the patient in their original bottle.  Medication: Hydrocodone/APAP Pill/Patch Count:  74 of 90 pills remain Pill/Patch Appearance: Markings consistent with prescribed medication Bottle Appearance: Standard pharmacy container. Clearly labeled. Filled Date: 6 / 3 / 2024 Last Medication intake:  TodaySafety precautions to be maintained throughout the outpatient stay will include: orient to surroundings, keep bed in low position, maintain call bell within reach at all times, provide assistance with transfer out of bed and ambulation.

## 2022-09-23 ENCOUNTER — Ambulatory Visit: Payer: Medicaid Other | Admitting: Student in an Organized Health Care Education/Training Program

## 2022-10-02 ENCOUNTER — Ambulatory Visit: Payer: Medicaid Other | Admitting: Student in an Organized Health Care Education/Training Program

## 2022-11-18 ENCOUNTER — Telehealth: Payer: Self-pay | Admitting: Student in an Organized Health Care Education/Training Program

## 2022-11-18 NOTE — Telephone Encounter (Signed)
PT called and stated that the PA wasn't fill out correct and for the last two months she has had to pay out of pocket. PT states that she also got a letter in the mail and was was that a letter was send to [facility] too. Please give patient a call. TY

## 2022-11-18 NOTE — Telephone Encounter (Signed)
Called patient and she states that she has enough meds and will not need refill until 11/25/22. Patient states that she received a letter stating why is was denied. Patien was unsure of why and paid out of pocket the last few prescriptions. Instructed her to when its time to be filled for her pharm. To send a letter for PA of the medication.

## 2022-12-17 ENCOUNTER — Ambulatory Visit
Payer: MEDICAID | Attending: Student in an Organized Health Care Education/Training Program | Admitting: Student in an Organized Health Care Education/Training Program

## 2022-12-17 ENCOUNTER — Encounter: Payer: Self-pay | Admitting: Student in an Organized Health Care Education/Training Program

## 2022-12-17 DIAGNOSIS — M1712 Unilateral primary osteoarthritis, left knee: Secondary | ICD-10-CM | POA: Insufficient documentation

## 2022-12-17 DIAGNOSIS — F1190 Opioid use, unspecified, uncomplicated: Secondary | ICD-10-CM | POA: Insufficient documentation

## 2022-12-17 DIAGNOSIS — M7918 Myalgia, other site: Secondary | ICD-10-CM | POA: Insufficient documentation

## 2022-12-17 DIAGNOSIS — G894 Chronic pain syndrome: Secondary | ICD-10-CM | POA: Insufficient documentation

## 2022-12-17 DIAGNOSIS — M1711 Unilateral primary osteoarthritis, right knee: Secondary | ICD-10-CM | POA: Diagnosis present

## 2022-12-17 DIAGNOSIS — Z79891 Long term (current) use of opiate analgesic: Secondary | ICD-10-CM

## 2022-12-17 DIAGNOSIS — G8929 Other chronic pain: Secondary | ICD-10-CM | POA: Insufficient documentation

## 2022-12-17 DIAGNOSIS — M170 Bilateral primary osteoarthritis of knee: Secondary | ICD-10-CM | POA: Diagnosis not present

## 2022-12-17 DIAGNOSIS — M5416 Radiculopathy, lumbar region: Secondary | ICD-10-CM | POA: Insufficient documentation

## 2022-12-17 MED ORDER — HYDROCODONE-ACETAMINOPHEN 7.5-325 MG PO TABS
1.0000 | ORAL_TABLET | Freq: Three times a day (TID) | ORAL | 0 refills | Status: AC | PRN
Start: 2022-12-23 — End: 2023-01-22

## 2022-12-17 MED ORDER — HYDROCODONE-ACETAMINOPHEN 7.5-325 MG PO TABS: 1.0000 | ORAL_TABLET | Freq: Three times a day (TID) | ORAL | 0 refills | Status: DC | PRN

## 2022-12-17 MED ORDER — HYDROCODONE-ACETAMINOPHEN 7.5-325 MG PO TABS
1.0000 | ORAL_TABLET | Freq: Three times a day (TID) | ORAL | 0 refills | Status: AC | PRN
Start: 2023-01-22 — End: 2023-02-21

## 2022-12-17 MED ORDER — PREGABALIN 100 MG PO CAPS
100.0000 mg | ORAL_CAPSULE | Freq: Three times a day (TID) | ORAL | 5 refills | Status: DC
Start: 2022-12-17 — End: 2023-03-13

## 2022-12-17 NOTE — Progress Notes (Signed)
PROVIDER NOTE: Information contained herein reflects review and annotations entered in association with encounter. Interpretation of such information and data should be left to medically-trained personnel. Information provided to patient can be located elsewhere in the medical record under "Patient Instructions". Document created using STT-dictation technology, any transcriptional errors that may result from process are unintentional.    Patient: [name]  Service Category: E/M  Provider: Edward Jolly, MD  DOB: 1960-09-11  DOS: 12/17/2022  Specialty: Interventional Pain Management  MRN: [id number]  Setting: Ambulatory outpatient  PCP: Dan Humphreys, MD  Type: Established Patient    Referring Provider: Dan Humphreys, MD  Location: Office  Delivery: Face-to-face     HPI  Ms. Shavonne Bort Tabor, a 62 y.o. year old female, is here today because of her No primary diagnosis found.. Ms. Wollin's primary complain today is Back Pain (lower)  Last encounter: My last encounter with her was on 09/03/22 Pertinent problems: Ms. Sabbath has Lumbar radiculopathy; Chronic bilateral low back pain with bilateral sciatica; Chronic pain syndrome; Chronic, continuous use of opioids; Chronic myofascial pain; Primary osteoarthritis of both hips; Type 2 diabetes mellitus without complications (HCC); and Pain in both hands on their pertinent problem list. Pain Assessment: Severity of Chronic pain is reported as a 7 /10. Location: Back Right, Left/everywhere. Onset: More than a month ago. Quality: Aching, Burning, Constant, Throbbing, Stabbing, Shooting. Timing: Constant. Modifying factor(s): Meds. Vitals:  height is 4\' 11"  (1.499 m) and weight is 107 lb (48.5 kg). Her temperature is 97.2 F (36.2 C) (abnormal). Her blood pressure is 137/91 (abnormal) and her pulse is 97. Her oxygen saturation is 98%.   Reason for encounter:   No change in medical history since last visit.  Patient's pain is at  baseline.  Patient continues multimodal pain regimen as prescribed.  States that it provides pain relief and improvement in functional status.    Pharmacotherapy Assessment  Analgesic: Norco 7.5 mg TID PRN #90/month   Monitoring: [place] PMP: PDMP reviewed during this encounter.       Pharmacotherapy: No side-effects or adverse reactions reported. Compliance: No problems identified. Effectiveness: Clinically acceptable.  UDS:  Summary  Date Value Ref Range Status  12/13/2021 Note  Final    Comment:    ==================================================================== [employer] Select 13 (MW) ==================================================================== Test                             Result       Flag       Units  Drug Present and Declared for Prescription Verification   Hydrocodone                    4177         EXPECTED   ng/mg creat   Hydromorphone                  816          EXPECTED   ng/mg creat   Dihydrocodeine                 338          EXPECTED   ng/mg creat   Norhydrocodone                 6097         EXPECTED   ng/mg creat    Sources of hydrocodone include scheduled prescription medications.    Hydromorphone, dihydrocodeine  and norhydrocodone are expected    metabolites of hydrocodone. Hydromorphone and dihydrocodeine are    also available as scheduled prescription medications.  ==================================================================== Test                      Result    Flag   Units      Ref Range   Creatinine              77               mg/dL      >=13 ==================================================================== Declared Medications:  The flagging and interpretation on this report are based on the  following declared medications.  Unexpected results may arise from  inaccuracies in the declared medications.   **Note: The testing scope of this panel includes these medications:   Hydrocodone (Norco)   **Note: The testing scope of  this panel does not include the  following reported medications:   Acetaminophen (Norco)  Albuterol (Ventolin HFA)  Aripiprazole (Abilify)  Aspirin  Budesonide (Symbicort)  Cetirizine (Zyrtec)  Famotidine (Pepcid)  Fluticasone (Flonase)  Formoterol (Symbicort)  Insulin (NovoLog)  Iron  Lidocaine  Linaclotide (Linzess)  Losartan (Cozaar)  Meclizine (Antivert)  Metformin  Mirtazapine (Remeron)  Montelukast (Singulair)  Multivitamin  Nicotine  Ondansetron (Zofran)  Pregabalin (Lyrica)  Propranolol (Inderal)  Rizatriptan (Maxalt)  Rosuvastatin (Crestor)  Solifenacin (Vesicare)  Tiotropium (Spiriva)  Topiramate (Topamax)  Trazodone (Desyrel)  Venlafaxine (Effexor)  Vitamin D2 ==================================================================== For clinical consultation, please call [phone]. ====================================================================         ROS  Constitutional: Denies any fever or chills Gastrointestinal: No reported hemesis, hematochezia, vomiting, or acute GI distress Musculoskeletal:  Low back pain Neurological:  bilateral feet parasthesias  Medication Review  ARIPiprazole, Accu-Chek Guide, Cysteamine Bitartrate, Dulaglutide, HYDROcodone-acetaminophen, Insulin Pen Needle, albuterol, aspirin, budesonide-formoterol, cetirizine, docusate sodium, famotidine, fluticasone, glipiZIDE, glucose blood, insulin aspart protamine - aspart, lidocaine, linaclotide, losartan, magnesium oxide, meclizine, metFORMIN, mirtazapine, montelukast, omeprazole, ondansetron, pregabalin, propranolol, rizatriptan, rosuvastatin, solifenacin, topiramate, traZODone, and venlafaxine XR  History Review  Allergy: Ms. Secker is allergic to tramadol, benadryl [diphenhydramine hcl], bextra [valdecoxib], invokana [canagliflozin], and sulfamethoxazole-trimethoprim. Drug: Ms. Splain  reports that she does not currently use drugs. Alcohol:  reports current  alcohol use of about 2.0 standard drinks of alcohol per week. Tobacco:  reports that she has quit smoking. Her smoking use included cigarettes. She has a 36 pack-year smoking history. She has been exposed to tobacco smoke. She has quit using smokeless tobacco. Social: Ms. Osier  reports that she has quit smoking. Her smoking use included cigarettes. She has a 36 pack-year smoking history. She has been exposed to tobacco smoke. She has quit using smokeless tobacco. She reports current alcohol use of about 2.0 standard drinks of alcohol per week. She reports that she does not currently use drugs. Medical:  has a past medical history of Abnormal CBC, Asthma, Asthma, BPPV (benign paroxysmal positional vertigo), Cardiac murmur, unspecified (08/28/2016), COPD (chronic obstructive pulmonary disease) (HCC), CPAP (continuous positive airway pressure) dependence, Degenerative disc disease, lumbar, Depression, Diabetes mellitus, Diverticulitis (10/2016), Elevated LFTs, Fatty liver, Fibromyalgia, Fibromyalgia, GERD (gastroesophageal reflux disease), Headache, History of degenerative disc disease, Hyperlipidemia, Hypertension, and Sleep apnea. Surgical: Ms. Bergen  has a past surgical history that includes Abdominal surgery; Abdominal hysterectomy; Esophagogastroduodenoscopy (egd) with propofol (N/A, 02/20/2017); Colonoscopy with propofol (N/A, 02/20/2017); Upper esophageal endoscopic ultrasound (eus) (N/A, 04/03/2017); Diverticulitis; EUS (N/A, 01/08/2018); Cholecystectomy (N/A, 04/27/2018); Cataract extraction w/PHACO (Left, 09/07/2019); Cataract extraction w/PHACO (  Right, 09/28/2019); and RIGHT/LEFT HEART CATH AND CORONARY ANGIOGRAPHY (Bilateral, 12/19/2021). Family: family history includes Diabetes in her mother; Hypertension in her mother.  Laboratory Chemistry Profile   Renal Lab Results  Component Value Date   BUN 20 04/19/2022   CREATININE 0.52 04/19/2022   BCR 14 11/27/2017   GFRAA >60 08/09/2019    GFRNONAA >60 04/19/2022     Hepatic Lab Results  Component Value Date   AST 10 (L) 04/19/2022   ALT 36 04/19/2022   ALBUMIN 3.9 04/19/2022   ALKPHOS 138 (H) 04/19/2022   LIPASE 47 04/19/2022   AMMONIA 46 (H) 04/05/2021     Electrolytes Lab Results  Component Value Date   NA 138 04/19/2022   K 3.7 04/19/2022   CL 107 04/19/2022   CALCIUM 9.8 04/19/2022   MG 1.4 (L) 04/19/2022     Bone No results found for: "VD25OH", "VD125OH2TOT", "UJ8119JY7", "WG9562ZH0", "25OHVITD1", "25OHVITD2", "25OHVITD3", "TESTOFREE", "TESTOSTERONE"   Inflammation (CRP: Acute Phase) (ESR: Chronic Phase) Lab Results  Component Value Date   CRP <1 11/27/2017   ESRSEDRATE 17 11/27/2017   LATICACIDVEN 1.3 04/05/2021       Note: Above Lab results reviewed.   Physical Exam  General appearance: Well nourished, well developed, and well hydrated. In no apparent acute distress Mental status: Alert, oriented x 3 (person, place, & time)       Respiratory: No evidence of acute respiratory distress Eyes: PERLA Vitals: BP (!) 137/91   Pulse 97   Temp (!) 97.2 F (36.2 C)   Ht 4\' 11"  (1.499 m)   Wt 107 lb (48.5 kg)   LMP  (LMP Unknown) Comment: "many years"  SpO2 98%   BMI 21.61 kg/m  BMI: Estimated body mass index is 21.61 kg/m as calculated from the following:   Height as of this encounter: 4\' 11"  (1.499 m).   Weight as of this encounter: 107 lb (48.5 kg). Ideal: Patient must be at least 60 in tall to calculate ideal body weight  Cervical Spine Area Exam  Skin & Axial Inspection: No masses, redness, edema, swelling, or associated skin lesions Alignment: Symmetrical Functional ROM: Unrestricted ROM      Stability: No instability detected Muscle Tone/Strength: Functionally intact. No obvious neuro-muscular anomalies detected. Sensory (Neurological): Musculoskeletal pain pattern, left Palpation: No palpable anomalies             Upper Extremity (UE) Exam    Side: Right upper extremity  Side: Left  upper extremity  Skin & Extremity Inspection: Skin color, temperature, and hair growth are WNL. No peripheral edema or cyanosis. No masses, redness, swelling, asymmetry, or associated skin lesions. No contractures.  Skin & Extremity Inspection: Skin color, temperature, and hair growth are WNL. No peripheral edema or cyanosis. No masses, redness, swelling, asymmetry, or associated skin lesions. No contractures.  Functional ROM: Unrestricted ROM          Functional ROM: Pain restricted ROM for shoulder  Muscle Tone/Strength: Functionally intact. No obvious neuro-muscular anomalies detected.  Muscle Tone/Strength: Functionally intact. No obvious neuro-muscular anomalies detected.  Sensory (Neurological): Unimpaired          Sensory (Neurological): Arthropathic arthralgia          Palpation: No palpable anomalies              Palpation: No palpable anomalies              Provocative Test(s):  Phalen's test: deferred Tinel's test: deferred Apley's scratch test (touch opposite shoulder):  Action 1 (Across chest): deferred Action 2 (Overhead): deferred Action 3 (LB reach): deferred   Provocative Test(s):  Phalen's test: deferred Tinel's test: deferred Apley's scratch test (touch opposite shoulder):  Action 1 (Across chest): Decreased ROM Action 2 (Overhead): Decreased ROM Action 3 (LB reach): Decreased ROM     Assessment   Status Diagnosis  Controlled Controlled Controlled 1. Lumbar radiculopathy   2. Chronic pain syndrome   3. Chronic myofascial pain   4. Arthritis of left knee   5. Primary osteoarthritis of right knee   6. Chronic, continuous use of opioids        Plan of Care  Ms. Midajah Doudna Musial has a current medication list which includes the following long-term medication(s): albuterol, aripiprazole, budesonide-formoterol, famotidine, insulin aspart protamine - aspart, losartan, metformin, mirtazapine, omeprazole, propranolol, rizatriptan, topiramate, trazodone, venlafaxine  xr, and pregabalin.  1. Lumbar radiculopathy - pregabalin (LYRICA) 100 MG capsule; Take 1 capsule (100 mg total) by mouth 3 (three) times daily.  Dispense: 90 capsule; Refill: 5 - HYDROcodone-acetaminophen (NORCO) 7.5-325 MG tablet; Take 1 tablet by mouth 3 (three) times daily as needed for severe pain. Must last 30 days  Dispense: 90 tablet; Refill: 0 - HYDROcodone-acetaminophen (NORCO) 7.5-325 MG tablet; Take 1 tablet by mouth 3 (three) times daily as needed for severe pain. Must last 30 days  Dispense: 90 tablet; Refill: 0 - HYDROcodone-acetaminophen (NORCO) 7.5-325 MG tablet; Take 1 tablet by mouth 3 (three) times daily as needed for severe pain. Must last 30 days  Dispense: 90 tablet; Refill: 0  2. Chronic pain syndrome - pregabalin (LYRICA) 100 MG capsule; Take 1 capsule (100 mg total) by mouth 3 (three) times daily.  Dispense: 90 capsule; Refill: 5 - HYDROcodone-acetaminophen (NORCO) 7.5-325 MG tablet; Take 1 tablet by mouth 3 (three) times daily as needed for severe pain. Must last 30 days  Dispense: 90 tablet; Refill: 0 - HYDROcodone-acetaminophen (NORCO) 7.5-325 MG tablet; Take 1 tablet by mouth 3 (three) times daily as needed for severe pain. Must last 30 days  Dispense: 90 tablet; Refill: 0 - HYDROcodone-acetaminophen (NORCO) 7.5-325 MG tablet; Take 1 tablet by mouth 3 (three) times daily as needed for severe pain. Must last 30 days  Dispense: 90 tablet; Refill: 0 - [employer]ox[employer]SU[employer] Select 13 (MW), Urine  3. Chronic myofascial pain - pregabalin (LYRICA) 100 MG capsule; Take 1 capsule (100 mg total) by mouth 3 (three) times daily.  Dispense: 90 capsule; Refill: 5 - HYDROcodone-acetaminophen (NORCO) 7.5-325 MG tablet; Take 1 tablet by mouth 3 (three) times daily as needed for severe pain. Must last 30 days  Dispense: 90 tablet; Refill: 0 - HYDROcodone-acetaminophen (NORCO) 7.5-325 MG tablet; Take 1 tablet by mouth 3 (three) times daily as needed for severe pain. Must last 30 days  Dispense: 90  tablet; Refill: 0 - HYDROcodone-acetaminophen (NORCO) 7.5-325 MG tablet; Take 1 tablet by mouth 3 (three) times daily as needed for severe pain. Must last 30 days  Dispense: 90 tablet; Refill: 0  4. Arthritis of left knee - HYDROcodone-acetaminophen (NORCO) 7.5-325 MG tablet; Take 1 tablet by mouth 3 (three) times daily as needed for severe pain. Must last 30 days  Dispense: 90 tablet; Refill: 0 - HYDROcodone-acetaminophen (NORCO) 7.5-325 MG tablet; Take 1 tablet by mouth 3 (three) times daily as needed for severe pain. Must last 30 days  Dispense: 90 tablet; Refill: 0 - HYDROcodone-acetaminophen (NORCO) 7.5-325 MG tablet; Take 1 tablet by mouth 3 (three) times daily as needed for severe pain. Must last 30  days  Dispense: 90 tablet; Refill: 0  5. Primary osteoarthritis of right knee - HYDROcodone-acetaminophen (NORCO) 7.5-325 MG tablet; Take 1 tablet by mouth 3 (three) times daily as needed for severe pain. Must last 30 days  Dispense: 90 tablet; Refill: 0 - HYDROcodone-acetaminophen (NORCO) 7.5-325 MG tablet; Take 1 tablet by mouth 3 (three) times daily as needed for severe pain. Must last 30 days  Dispense: 90 tablet; Refill: 0 - HYDROcodone-acetaminophen (NORCO) 7.5-325 MG tablet; Take 1 tablet by mouth 3 (three) times daily as needed for severe pain. Must last 30 days  Dispense: 90 tablet; Refill: 0  6. Chronic, continuous use of opioids - HYDROcodone-acetaminophen (NORCO) 7.5-325 MG tablet; Take 1 tablet by mouth 3 (three) times daily as needed for severe pain. Must last 30 days  Dispense: 90 tablet; Refill: 0 - HYDROcodone-acetaminophen (NORCO) 7.5-325 MG tablet; Take 1 tablet by mouth 3 (three) times daily as needed for severe pain. Must last 30 days  Dispense: 90 tablet; Refill: 0 - HYDROcodone-acetaminophen (NORCO) 7.5-325 MG tablet; Take 1 tablet by mouth 3 (three) times daily as needed for severe pain. Must last 30 days  Dispense: 90 tablet; Refill: 0  Pharmacotherapy (Medications  Ordered): Meds ordered this encounter  Medications   pregabalin (LYRICA) 100 MG capsule    Sig: Take 1 capsule (100 mg total) by mouth 3 (three) times daily.    Dispense:  90 capsule    Refill:  5   HYDROcodone-acetaminophen (NORCO) 7.5-325 MG tablet    Sig: Take 1 tablet by mouth 3 (three) times daily as needed for severe pain. Must last 30 days    Dispense:  90 tablet    Refill:  0    Chronic Pain: STOP Act (Not applicable) Fill 1 day early if closed on refill date. Avoid benzodiazepines within 8 hours of opioids   HYDROcodone-acetaminophen (NORCO) 7.5-325 MG tablet    Sig: Take 1 tablet by mouth 3 (three) times daily as needed for severe pain. Must last 30 days    Dispense:  90 tablet    Refill:  0    Chronic Pain: STOP Act (Not applicable) Fill 1 day early if closed on refill date. Avoid benzodiazepines within 8 hours of opioids   HYDROcodone-acetaminophen (NORCO) 7.5-325 MG tablet    Sig: Take 1 tablet by mouth 3 (three) times daily as needed for severe pain. Must last 30 days    Dispense:  90 tablet    Refill:  0    Chronic Pain: STOP Act (Not applicable) Fill 1 day early if closed on refill date. Avoid benzodiazepines within 8 hours of opioids    Follow-up plan:   Return in about 3 months (around 03/18/2023) for MM, F2F.   Recent Visits No visits were found meeting these conditions. Showing recent visits within past 90 days and meeting all other requirements Today's Visits Date Type Provider Dept  12/17/22 Office Visit Edward Jolly, MD [facility]  Showing today's visits and meeting all other requirements Future Appointments Date Type Provider Dept  03/13/23 Appointment Edward Jolly, MD [facility]  Showing future appointments within next 90 days and meeting all other requirements  I discussed the assessment and treatment plan with the patient. The patient was provided an opportunity to ask questions and all were answered. The patient agreed with the  plan and demonstrated an understanding of the instructions.  Patient advised to call back or seek an in-person evaluation if the symptoms or condition worsens.  Duration of  encounter: 30 minutes.  Note by: Edward Jolly, MD Date: 12/17/2022; Time: 9:13 AM

## 2022-12-17 NOTE — Progress Notes (Signed)
Nursing Pain Medication Assessment:  Safety precautions to be maintained throughout the outpatient stay will include: orient to surroundings, keep bed in low position, maintain call bell within reach at all times, provide assistance with transfer out of bed and ambulation.  Medication Inspection Compliance: Pill count conducted under aseptic conditions, in front of the patient. Neither the pills nor the bottle was removed from the patient's sight at any time. Once count was completed pills were immediately returned to the patient in their original bottle.  Medication: Hydrocodone/APAP Pill/Patch Count:  29 of 90 pills remain Pill/Patch Appearance: Markings consistent with prescribed medication Bottle Appearance: Standard pharmacy container. Clearly labeled. Filled Date: 8 / 41 / 2024 Last Medication intake:  TodaySafety precautions to be maintained throughout the outpatient stay will include: orient to surroundings, keep bed in low position, maintain call bell within reach at all times, provide assistance with transfer out of bed and ambulation.

## 2022-12-19 LAB — TOXASSURE SELECT 13 (MW), URINE

## 2023-03-13 ENCOUNTER — Encounter: Payer: Self-pay | Admitting: Student in an Organized Health Care Education/Training Program

## 2023-03-13 ENCOUNTER — Ambulatory Visit
Payer: MEDICAID | Attending: Student in an Organized Health Care Education/Training Program | Admitting: Student in an Organized Health Care Education/Training Program

## 2023-03-13 DIAGNOSIS — M7918 Myalgia, other site: Secondary | ICD-10-CM | POA: Diagnosis not present

## 2023-03-13 DIAGNOSIS — F1190 Opioid use, unspecified, uncomplicated: Secondary | ICD-10-CM | POA: Diagnosis present

## 2023-03-13 DIAGNOSIS — G8929 Other chronic pain: Secondary | ICD-10-CM | POA: Insufficient documentation

## 2023-03-13 DIAGNOSIS — M1711 Unilateral primary osteoarthritis, right knee: Secondary | ICD-10-CM | POA: Diagnosis present

## 2023-03-13 DIAGNOSIS — M1712 Unilateral primary osteoarthritis, left knee: Secondary | ICD-10-CM | POA: Diagnosis present

## 2023-03-13 DIAGNOSIS — M5416 Radiculopathy, lumbar region: Secondary | ICD-10-CM | POA: Insufficient documentation

## 2023-03-13 DIAGNOSIS — G894 Chronic pain syndrome: Secondary | ICD-10-CM | POA: Insufficient documentation

## 2023-03-13 DIAGNOSIS — M170 Bilateral primary osteoarthritis of knee: Secondary | ICD-10-CM | POA: Diagnosis not present

## 2023-03-13 DIAGNOSIS — Z79891 Long term (current) use of opiate analgesic: Secondary | ICD-10-CM

## 2023-03-13 MED ORDER — PREGABALIN 100 MG PO CAPS: 100.0000 mg | ORAL_CAPSULE | Freq: Three times a day (TID) | ORAL | 5 refills | Status: DC

## 2023-03-13 MED ORDER — HYDROCODONE-ACETAMINOPHEN 7.5-325 MG PO TABS
1.0000 | ORAL_TABLET | Freq: Three times a day (TID) | ORAL | 0 refills | Status: AC | PRN
Start: 2023-03-23 — End: 2023-04-22

## 2023-03-13 MED ORDER — HYDROCODONE-ACETAMINOPHEN 7.5-325 MG PO TABS
1.0000 | ORAL_TABLET | Freq: Three times a day (TID) | ORAL | 0 refills | Status: AC | PRN
Start: 2023-04-22 — End: 2023-05-22

## 2023-03-13 MED ORDER — HYDROCODONE-ACETAMINOPHEN 7.5-325 MG PO TABS
1.0000 | ORAL_TABLET | Freq: Three times a day (TID) | ORAL | 0 refills | Status: DC | PRN
Start: 2023-05-22 — End: 2023-06-10

## 2023-03-13 NOTE — Progress Notes (Signed)
Nursing Pain Medication Assessment:  Safety precautions to be maintained throughout the outpatient stay will include: orient to surroundings, keep bed in low position, maintain call bell within reach at all times, provide assistance with transfer out of bed and ambulation.  Medication Inspection Compliance: Pill count conducted under aseptic conditions, in front of the patient. Neither the pills nor the bottle was removed from the patient's sight at any time. Once count was completed pills were immediately returned to the patient in their original bottle.  Medication: Hydrocodone/APAP Pill/Patch Count:  43 of 90 pills remain Pill/Patch Appearance: Markings consistent with prescribed medication Bottle Appearance: Standard pharmacy container. Clearly labeled. Filled Date: 25 / 27 / 2025 Last Medication intake:  TodaySafety precautions to be maintained throughout the outpatient stay will include: orient to surroundings, keep bed in low position, maintain call bell within reach at all times, provide assistance with transfer out of bed and ambulation.

## 2023-03-13 NOTE — Progress Notes (Signed)
PROVIDER NOTE: Information contained herein reflects review and annotations entered in association with encounter. Interpretation of such information and data should be left to medically-trained personnel. Information provided to patient can be located elsewhere in the medical record under "Patient Instructions". Document created using STT-dictation technology, any transcriptional errors that may result from process are unintentional.    Patient: [name]  Service Category: E/M  Provider: Edward Jolly, MD  DOB: 14-Mar-1961  DOS: 03/13/2023  Specialty: Interventional Pain Management  MRN: [id number]  Setting: Ambulatory outpatient  PCP: Dan Humphreys, MD  Type: Established Patient    Referring Provider: Dan Humphreys, MD  Location: Office  Delivery: Face-to-face     HPI  [name], a 62 y.o. year old female, is here today because of her No primary diagnosis found.. [name]'s primary complain today is Back Pain (lower)  Last encounter: My last encounter with her was on 12/17/22 Pertinent problems: [name] has Lumbar radiculopathy; Chronic bilateral low back pain with bilateral sciatica; Chronic pain syndrome; Chronic, continuous use of opioids; Chronic myofascial pain; Primary osteoarthritis of both hips; Type 2 diabetes mellitus without complications (HCC); and Pain in both hands on their pertinent problem list. Pain Assessment: Severity of Chronic pain is reported as a 5 /10. Location: Back Lower/both legs to the foot. Onset: More than a month ago. Quality: Discomfort. Timing: Constant. Modifying factor(s): Hydrocodone, Lyrica. Vitals:  height is 4\' 11"  (1.499 m) and weight is 90 lb (40.8 kg). Her temporal temperature is 97.5 F (36.4 C) (abnormal). Her blood pressure is 175/98 (abnormal) and her pulse is 90. Her respiration is 16 and oxygen saturation is 96%.   Reason for encounter:   No change in medical history since last visit.  Patient's pain is at  baseline.  Patient continues multimodal pain regimen as prescribed.  States that it provides pain relief and improvement in functional status.    Pharmacotherapy Assessment  Analgesic: Norco 7.5 mg TID PRN #90/month   Monitoring: [place] PMP: PDMP reviewed during this encounter.       Pharmacotherapy: No side-effects or adverse reactions reported. Compliance: No problems identified. Effectiveness: Clinically acceptable.  UDS:  Summary  Date Value Ref Range Status  12/17/2022 Note  Final    Comment:    ==================================================================== [facility] (MW) ==================================================================== Test                             Result       Flag       Units  Drug Present and Declared for Prescription Verification   Hydrocodone                    1238         EXPECTED   ng/mg creat   Hydromorphone                  328          EXPECTED   ng/mg creat   Dihydrocodeine                 349          EXPECTED   ng/mg creat   Norhydrocodone                 2331         EXPECTED   ng/mg creat    Sources of hydrocodone include scheduled prescription medications.  Hydromorphone, dihydrocodeine and norhydrocodone are expected    metabolites of hydrocodone. Hydromorphone and dihydrocodeine are    also available as scheduled prescription medications.  ==================================================================== Test                      Result    Flag   Units      Ref Range   Creatinine              39               mg/dL      >=28 ==================================================================== Declared Medications:  The flagging and interpretation on this report are based on the  following declared medications.  Unexpected results may arise from  inaccuracies in the declared medications.   **Note: The testing scope of this panel includes these medications:   Hydrocodone (Norco)   **Note: The testing scope of  this panel does not include the  following reported medications:   Acetaminophen (Norco)  Albuterol (Ventolin HFA)  Aripiprazole (Abilify)  Aspirin  Budesonide (Symbicort)  Cetirizine (Zyrtec)  Cysteamine (Procysbi)  Docusate (Colace)  Dulaglutide (Trulicity)  Famotidine (Pepcid)  Fluticasone (Flonase)  Formoterol (Symbicort)  Glipizide (Glucotrol)  Insulin (NovoLog)  Linaclotide (Linzess)  Losartan (Cozaar)  Magnesium (Mag-Ox)  Meclizine (Antivert)  Metformin (Glucophage)  Mirtazapine (Remeron)  Montelukast (Singulair)  Omeprazole (Prilosec)  Ondansetron (Zofran)  Pregabalin (Lyrica)  Propranolol (Inderal)  Rizatriptan (Maxalt)  Rosuvastatin (Crestor)  Solifenacin (Vesicare)  Topical Lidocaine (Lidoderm)  Topiramate (Topamax)  Trazodone (Desyrel)  Venlafaxine (Effexor) ==================================================================== For clinical consultation, please call [phone]. ====================================================================         ROS  Constitutional: Denies any fever or chills Gastrointestinal: No reported hemesis, hematochezia, vomiting, or acute GI distress Musculoskeletal:  Low back pain Neurological:  bilateral feet parasthesias  Medication Review  ARIPiprazole, Accu-Chek Guide, Cysteamine Bitartrate, Dulaglutide, HYDROcodone-acetaminophen, Insulin Pen Needle, albuterol, aspirin, budesonide-formoterol, cetirizine, docusate sodium, famotidine, fluticasone, glipiZIDE, glucose blood, insulin aspart protamine - aspart, lidocaine, linaclotide, losartan, magnesium oxide, meclizine, metFORMIN, mirtazapine, montelukast, omeprazole, ondansetron, pregabalin, propranolol, rizatriptan, rosuvastatin, solifenacin, topiramate, traZODone, and venlafaxine XR  History Review  Allergy: [name] is allergic to tramadol, benadryl [diphenhydramine hcl], bextra [valdecoxib], invokana [canagliflozin], and  sulfamethoxazole-trimethoprim. Drug: [name]  reports that she does not currently use drugs. Alcohol:  reports current alcohol use of about 2.0 standard drinks of alcohol per week. Tobacco:  reports that she has quit smoking. Her smoking use included cigarettes. She has a 36 pack-year smoking history. She has been exposed to tobacco smoke. She has quit using smokeless tobacco. Social: [name]  reports that she has quit smoking. Her smoking use included cigarettes. She has a 36 pack-year smoking history. She has been exposed to tobacco smoke. She has quit using smokeless tobacco. She reports current alcohol use of about 2.0 standard drinks of alcohol per week. She reports that she does not currently use drugs. Medical:  has a past medical history of Abnormal CBC, Asthma, Asthma, BPPV (benign paroxysmal positional vertigo), Cardiac murmur, unspecified (08/28/2016), COPD (chronic obstructive pulmonary disease) (HCC), CPAP (continuous positive airway pressure) dependence, Degenerative disc disease, lumbar, Depression, Diabetes mellitus, Diverticulitis (10/2016), Elevated LFTs, Fatty liver, Fibromyalgia, Fibromyalgia, GERD (gastroesophageal reflux disease), Headache, History of degenerative disc disease, Hyperlipidemia, Hypertension, and Sleep apnea. Surgical: [name]  has a past surgical history that includes Abdominal surgery; Abdominal hysterectomy; Esophagogastroduodenoscopy (egd) with propofol (N/A, 02/20/2017); Colonoscopy with propofol (N/A, 02/20/2017); Upper esophageal endoscopic ultrasound (eus) (N/A, 04/03/2017); Diverticulitis; EUS (N/A, 01/08/2018);  Cholecystectomy (N/A, 04/27/2018); Cataract extraction w/PHACO (Left, 09/07/2019); Cataract extraction w/PHACO (Right, 09/28/2019); and RIGHT/LEFT HEART CATH AND CORONARY ANGIOGRAPHY (Bilateral, 12/19/2021). Family: family history includes Diabetes in her mother; Hypertension in her mother.  Laboratory Chemistry Profile   Renal Lab  Results  Component Value Date   BUN 20 04/19/2022   CREATININE 0.52 04/19/2022   BCR 14 11/27/2017   GFRAA >60 08/09/2019   GFRNONAA >60 04/19/2022     Hepatic Lab Results  Component Value Date   AST 10 (L) 04/19/2022   ALT 36 04/19/2022   ALBUMIN 3.9 04/19/2022   ALKPHOS 138 (H) 04/19/2022   LIPASE 47 04/19/2022   AMMONIA 46 (H) 04/05/2021     Electrolytes Lab Results  Component Value Date   NA 138 04/19/2022   K 3.7 04/19/2022   CL 107 04/19/2022   CALCIUM 9.8 04/19/2022   MG 1.4 (L) 04/19/2022     Bone No results found for: "VD25OH", "VD125OH2TOT", "WU9811BJ4", "NW2956OZ3", "25OHVITD1", "25OHVITD2", "25OHVITD3", "TESTOFREE", "TESTOSTERONE"   Inflammation (CRP: Acute Phase) (ESR: Chronic Phase) Lab Results  Component Value Date   CRP <1 11/27/2017   ESRSEDRATE 17 11/27/2017   LATICACIDVEN 1.3 04/05/2021       Note: Above Lab results reviewed.   Physical Exam  General appearance: Well nourished, well developed, and well hydrated. In no apparent acute distress Mental status: Alert, oriented x 3 (person, place, & time)       Respiratory: No evidence of acute respiratory distress Eyes: PERLA Vitals: BP (!) 175/98 (Cuff Size: Normal)   Pulse 90   Temp (!) 97.5 F (36.4 C) (Temporal)   Resp 16   Ht 4\' 11"  (1.499 m)   Wt 90 lb (40.8 kg)   LMP  (LMP Unknown) Comment: "many years"  SpO2 96%   BMI 18.18 kg/m  BMI: Estimated body mass index is 18.18 kg/m as calculated from the following:   Height as of this encounter: 4\' 11"  (1.499 m).   Weight as of this encounter: 90 lb (40.8 kg). Ideal: Female patients must weigh at least 45.5 kg to calculate ideal body weight  Cervical Spine Area Exam  Skin & Axial Inspection: No masses, redness, edema, swelling, or associated skin lesions Alignment: Symmetrical Functional ROM: Unrestricted ROM      Stability: No instability detected Muscle Tone/Strength: Functionally intact. No obvious neuro-muscular anomalies  detected. Sensory (Neurological): Musculoskeletal pain pattern, left Palpation: No palpable anomalies             Upper Extremity (UE) Exam    Side: Right upper extremity  Side: Left upper extremity  Skin & Extremity Inspection: Skin color, temperature, and hair growth are WNL. No peripheral edema or cyanosis. No masses, redness, swelling, asymmetry, or associated skin lesions. No contractures.  Skin & Extremity Inspection: Skin color, temperature, and hair growth are WNL. No peripheral edema or cyanosis. No masses, redness, swelling, asymmetry, or associated skin lesions. No contractures.  Functional ROM: Unrestricted ROM          Functional ROM: Pain restricted ROM for shoulder  Muscle Tone/Strength: Functionally intact. No obvious neuro-muscular anomalies detected.  Muscle Tone/Strength: Functionally intact. No obvious neuro-muscular anomalies detected.  Sensory (Neurological): Unimpaired          Sensory (Neurological): Arthropathic arthralgia          Palpation: No palpable anomalies              Palpation: No palpable anomalies  Provocative Test(s):  Phalen's test: deferred Tinel's test: deferred Apley's scratch test (touch opposite shoulder):  Action 1 (Across chest): deferred Action 2 (Overhead): deferred Action 3 (LB reach): deferred   Provocative Test(s):  Phalen's test: deferred Tinel's test: deferred Apley's scratch test (touch opposite shoulder):  Action 1 (Across chest): Decreased ROM Action 2 (Overhead): Decreased ROM Action 3 (LB reach): Decreased ROM     Assessment   Status Diagnosis  Controlled Controlled Controlled 1. Lumbar radiculopathy   2. Chronic pain syndrome   3. Chronic myofascial pain   4. Arthritis of left knee   5. Primary osteoarthritis of right knee   6. Chronic, continuous use of opioids         Plan of Care  [name] has a current medication list which includes the following long-term medication(s): albuterol,  aripiprazole, budesonide-formoterol, famotidine, metformin, mirtazapine, omeprazole, propranolol, rizatriptan, topiramate, trazodone, venlafaxine xr, insulin aspart protamine - aspart, losartan, and pregabalin.  1. Lumbar radiculopathy - HYDROcodone-acetaminophen (NORCO) 7.5-325 MG tablet; Take 1 tablet by mouth 3 (three) times daily as needed for severe pain (pain score 7-10). Must last 30 days  Dispense: 90 tablet; Refill: 0 - HYDROcodone-acetaminophen (NORCO) 7.5-325 MG tablet; Take 1 tablet by mouth 3 (three) times daily as needed for severe pain (pain score 7-10). Must last 30 days  Dispense: 90 tablet; Refill: 0 - HYDROcodone-acetaminophen (NORCO) 7.5-325 MG tablet; Take 1 tablet by mouth 3 (three) times daily as needed for severe pain (pain score 7-10). Must last 30 days  Dispense: 90 tablet; Refill: 0 - pregabalin (LYRICA) 100 MG capsule; Take 1 capsule (100 mg total) by mouth 3 (three) times daily.  Dispense: 90 capsule; Refill: 5  2. Chronic pain syndrome - HYDROcodone-acetaminophen (NORCO) 7.5-325 MG tablet; Take 1 tablet by mouth 3 (three) times daily as needed for severe pain (pain score 7-10). Must last 30 days  Dispense: 90 tablet; Refill: 0 - HYDROcodone-acetaminophen (NORCO) 7.5-325 MG tablet; Take 1 tablet by mouth 3 (three) times daily as needed for severe pain (pain score 7-10). Must last 30 days  Dispense: 90 tablet; Refill: 0 - HYDROcodone-acetaminophen (NORCO) 7.5-325 MG tablet; Take 1 tablet by mouth 3 (three) times daily as needed for severe pain (pain score 7-10). Must last 30 days  Dispense: 90 tablet; Refill: 0 - pregabalin (LYRICA) 100 MG capsule; Take 1 capsule (100 mg total) by mouth 3 (three) times daily.  Dispense: 90 capsule; Refill: 5  3. Chronic myofascial pain - HYDROcodone-acetaminophen (NORCO) 7.5-325 MG tablet; Take 1 tablet by mouth 3 (three) times daily as needed for severe pain (pain score 7-10). Must last 30 days  Dispense: 90 tablet; Refill: 0 -  HYDROcodone-acetaminophen (NORCO) 7.5-325 MG tablet; Take 1 tablet by mouth 3 (three) times daily as needed for severe pain (pain score 7-10). Must last 30 days  Dispense: 90 tablet; Refill: 0 - HYDROcodone-acetaminophen (NORCO) 7.5-325 MG tablet; Take 1 tablet by mouth 3 (three) times daily as needed for severe pain (pain score 7-10). Must last 30 days  Dispense: 90 tablet; Refill: 0 - pregabalin (LYRICA) 100 MG capsule; Take 1 capsule (100 mg total) by mouth 3 (three) times daily.  Dispense: 90 capsule; Refill: 5  4. Arthritis of left knee - HYDROcodone-acetaminophen (NORCO) 7.5-325 MG tablet; Take 1 tablet by mouth 3 (three) times daily as needed for severe pain (pain score 7-10). Must last 30 days  Dispense: 90 tablet; Refill: 0 - HYDROcodone-acetaminophen (NORCO) 7.5-325 MG tablet; Take 1 tablet by mouth 3 (  three) times daily as needed for severe pain (pain score 7-10). Must last 30 days  Dispense: 90 tablet; Refill: 0 - HYDROcodone-acetaminophen (NORCO) 7.5-325 MG tablet; Take 1 tablet by mouth 3 (three) times daily as needed for severe pain (pain score 7-10). Must last 30 days  Dispense: 90 tablet; Refill: 0  5. Primary osteoarthritis of right knee - HYDROcodone-acetaminophen (NORCO) 7.5-325 MG tablet; Take 1 tablet by mouth 3 (three) times daily as needed for severe pain (pain score 7-10). Must last 30 days  Dispense: 90 tablet; Refill: 0 - HYDROcodone-acetaminophen (NORCO) 7.5-325 MG tablet; Take 1 tablet by mouth 3 (three) times daily as needed for severe pain (pain score 7-10). Must last 30 days  Dispense: 90 tablet; Refill: 0 - HYDROcodone-acetaminophen (NORCO) 7.5-325 MG tablet; Take 1 tablet by mouth 3 (three) times daily as needed for severe pain (pain score 7-10). Must last 30 days  Dispense: 90 tablet; Refill: 0  6. Chronic, continuous use of opioids - HYDROcodone-acetaminophen (NORCO) 7.5-325 MG tablet; Take 1 tablet by mouth 3 (three) times daily as needed for severe pain (pain  score 7-10). Must last 30 days  Dispense: 90 tablet; Refill: 0 - HYDROcodone-acetaminophen (NORCO) 7.5-325 MG tablet; Take 1 tablet by mouth 3 (three) times daily as needed for severe pain (pain score 7-10). Must last 30 days  Dispense: 90 tablet; Refill: 0 - HYDROcodone-acetaminophen (NORCO) 7.5-325 MG tablet; Take 1 tablet by mouth 3 (three) times daily as needed for severe pain (pain score 7-10). Must last 30 days  Dispense: 90 tablet; Refill: 0   Pharmacotherapy (Medications Ordered): Meds ordered this encounter  Medications   HYDROcodone-acetaminophen (NORCO) 7.5-325 MG tablet    Sig: Take 1 tablet by mouth 3 (three) times daily as needed for severe pain (pain score 7-10). Must last 30 days    Dispense:  90 tablet    Refill:  0    Chronic Pain: STOP Act (Not applicable) Fill 1 day early if closed on refill date. Avoid benzodiazepines within 8 hours of opioids   HYDROcodone-acetaminophen (NORCO) 7.5-325 MG tablet    Sig: Take 1 tablet by mouth 3 (three) times daily as needed for severe pain (pain score 7-10). Must last 30 days    Dispense:  90 tablet    Refill:  0    Chronic Pain: STOP Act (Not applicable) Fill 1 day early if closed on refill date. Avoid benzodiazepines within 8 hours of opioids   HYDROcodone-acetaminophen (NORCO) 7.5-325 MG tablet    Sig: Take 1 tablet by mouth 3 (three) times daily as needed for severe pain (pain score 7-10). Must last 30 days    Dispense:  90 tablet    Refill:  0    Chronic Pain: STOP Act (Not applicable) Fill 1 day early if closed on refill date. Avoid benzodiazepines within 8 hours of opioids   pregabalin (LYRICA) 100 MG capsule    Sig: Take 1 capsule (100 mg total) by mouth 3 (three) times daily.    Dispense:  90 capsule    Refill:  5    Follow-up plan:   Return in about 3 months (around 06/17/2023) for MM, F2F.   Recent Visits Date Type Provider Dept  12/17/22 Office Visit Edward Jolly, MD [facility]  Showing recent visits  within past 90 days and meeting all other requirements Today's Visits Date Type Provider Dept  03/13/23 Office Visit Edward Jolly, MD [facility]  Showing today's visits and meeting all other requirements Future Appointments  No visits were found meeting these conditions. Showing future appointments within next 90 days and meeting all other requirements  I discussed the assessment and treatment plan with the patient. The patient was provided an opportunity to ask questions and all were answered. The patient agreed with the plan and demonstrated an understanding of the instructions.  Patient advised to call back or seek an in-person evaluation if the symptoms or condition worsens.  Duration of encounter: 30 minutes.  Note by: Edward Jolly, MD Date: 03/13/2023; Time: 8:57 AM

## 2023-05-05 ENCOUNTER — Ambulatory Visit: Admission: RE | Admit: 2023-05-05 | Payer: MEDICAID | Source: Ambulatory Visit

## 2023-06-10 ENCOUNTER — Ambulatory Visit
Payer: MEDICAID | Attending: Student in an Organized Health Care Education/Training Program | Admitting: Student in an Organized Health Care Education/Training Program

## 2023-06-10 ENCOUNTER — Encounter: Payer: Self-pay | Admitting: Student in an Organized Health Care Education/Training Program

## 2023-06-10 VITALS — BP 155/74 | HR 77 | Temp 97.9°F | Resp 16 | Ht 59.0 in | Wt 87.0 lb

## 2023-06-10 DIAGNOSIS — M7918 Myalgia, other site: Secondary | ICD-10-CM | POA: Insufficient documentation

## 2023-06-10 DIAGNOSIS — M1711 Unilateral primary osteoarthritis, right knee: Secondary | ICD-10-CM

## 2023-06-10 DIAGNOSIS — M5416 Radiculopathy, lumbar region: Secondary | ICD-10-CM | POA: Diagnosis present

## 2023-06-10 DIAGNOSIS — F1190 Opioid use, unspecified, uncomplicated: Secondary | ICD-10-CM

## 2023-06-10 DIAGNOSIS — G894 Chronic pain syndrome: Secondary | ICD-10-CM | POA: Diagnosis present

## 2023-06-10 DIAGNOSIS — M170 Bilateral primary osteoarthritis of knee: Secondary | ICD-10-CM

## 2023-06-10 DIAGNOSIS — M797 Fibromyalgia: Secondary | ICD-10-CM | POA: Diagnosis present

## 2023-06-10 DIAGNOSIS — Z79891 Long term (current) use of opiate analgesic: Secondary | ICD-10-CM

## 2023-06-10 DIAGNOSIS — G8929 Other chronic pain: Secondary | ICD-10-CM

## 2023-06-10 DIAGNOSIS — M1712 Unilateral primary osteoarthritis, left knee: Secondary | ICD-10-CM

## 2023-06-10 MED ORDER — HYDROCODONE-ACETAMINOPHEN 7.5-325 MG PO TABS
1.0000 | ORAL_TABLET | Freq: Three times a day (TID) | ORAL | 0 refills | Status: DC | PRN
Start: 2023-06-22 — End: 2023-06-13

## 2023-06-10 MED ORDER — HYDROCODONE-ACETAMINOPHEN 7.5-325 MG PO TABS: 1.0000 | ORAL_TABLET | Freq: Three times a day (TID) | ORAL | 0 refills | Status: DC | PRN

## 2023-06-10 NOTE — Progress Notes (Signed)
 PROVIDER NOTE: Information contained herein reflects review and annotations entered in association with encounter. Interpretation of such information and data should be left to medically-trained personnel. Information provided to patient can be located elsewhere in the medical record under "Patient Instructions". Document created using STT-dictation technology, any transcriptional errors that may result from process are unintentional.    Patient: [name]  Service Category: E/M  Provider: Edward Jolly, MD  DOB: August 28, 1960  DOS: 06/10/2023  Referring Provider: Dan Humphreys, MD  MRN: [id number]  Specialty: Interventional Pain Management  PCP: Dan Humphreys, MD  Type: Established Patient  Setting: Ambulatory outpatient    Location: Office  Delivery: Face-to-face     HPI  [name], a 63 y.o. year old female, is here today because of her Lumbar radiculopathy [M54.16]. [name]'s primary complain today is low back pain with radiation into bilateral legs  Pertinent problems: [name] has Lumbar radiculopathy; Chronic bilateral low back pain with bilateral sciatica; Chronic pain syndrome; Chronic, continuous use of opioids; Chronic myofascial pain; Primary osteoarthritis of both hips; Type 2 diabetes mellitus without complications (HCC); and Pain in both hands on their pertinent problem list. Pain Assessment: Severity of Chronic pain is reported as a 7 /10. Location: Back Lower, Left, Right/into hips and down both legs to the feet. Onset: More than a month ago. Quality: Discomfort, Constant, Aching, Burning, Dull, Sharp, Tingling. Timing: Constant. Modifying factor(s): medications help in combination with naproxyn, gabapentin and hyrodocone. Vitals:  height is 4\' 11"  (1.499 m) and weight is 87 lb (39.5 kg). Her temporal temperature is 97.9 F (36.6 C). Her blood pressure is 155/74 (abnormal) and her pulse is 77. Her respiration is 16 and oxygen saturation is 97%.   BMI: Estimated body mass index is 17.57 kg/m as calculated from the following:   Height as of this encounter: 4\' 11"  (1.499 m).   Weight as of this encounter: 87 lb (39.5 kg). Last encounter: 03/13/2023. Last procedure: 07/10/2022.  Reason for encounter:   History of Present Illness   [name] is a 63 year old female with hx of lumbar radicular pain  who presents with bilateral leg pain, numbness and weakness.  She experiences persistent leg pain affecting the entire leg, with a sensation of weakness and severe discomfort. The pain radiates down the back and front of her legs, accompanied by numbness and tingling, and there is significant pain in the right buttock area. Despite treatment, the leg pain has not improved, although there is some improvement in her back pain.  She has experienced two falls recently, contributing to her fear of walking. She has difficulty using a cane and finds a walker with two wheels unsatisfactory. She is concerned about falling and is hesitant to walk without assistance.  She is currently taking naproxen for inflammation and gabapentin for leg pain. She also uses hydrocodone, with the next refill scheduled for March 30th.       Pharmacotherapy Assessment  Analgesic: Norco 7.5 mg TID PRN #90/month   Monitoring: [place] PMP: PDMP not reviewed this encounter.       Pharmacotherapy: No side-effects or adverse reactions reported. Compliance: No problems identified. Effectiveness: Clinically acceptable.  Vernie Ammons, RN  06/10/2023  9:34 AM  Sign when Signing Visit Nursing Pain Medication Assessment:  Safety precautions to be maintained throughout the outpatient stay will include: orient to surroundings, keep bed in low position, maintain call bell within reach at all times, provide assistance with transfer  out of bed and ambulation.  Medication Inspection Compliance: Pill count conducted under aseptic conditions, in front of the patient.  Neither the pills nor the bottle was removed from the patient's sight at any time. Once count was completed pills were immediately returned to the patient in their original bottle.  Medication: Hydrocodone/APAP Pill/Patch Count:  43 of 90 pills remain Pill/Patch Appearance: Markings consistent with prescribed medication Bottle Appearance: Standard pharmacy container. Clearly labeled. Filled Date: 02 / 28 / 2025 Last Medication intake:  Today  No results found for: "CBDTHCR" No results found for: "D8THCCBX" No results found for: "D9THCCBX"  UDS:  Summary  Date Value Ref Range Status  12/17/2022 Note  Final    Comment:    ==================================================================== [facility] (MW) ==================================================================== Test                             Result       Flag       Units  Drug Present and Declared for Prescription Verification   Hydrocodone                    1238         EXPECTED   ng/mg creat   Hydromorphone                  328          EXPECTED   ng/mg creat   Dihydrocodeine                 349          EXPECTED   ng/mg creat   Norhydrocodone                 2331         EXPECTED   ng/mg creat    Sources of hydrocodone include scheduled prescription medications.    Hydromorphone, dihydrocodeine and norhydrocodone are expected    metabolites of hydrocodone. Hydromorphone and dihydrocodeine are    also available as scheduled prescription medications.  ==================================================================== Test                      Result    Flag   Units      Ref Range   Creatinine              39               mg/dL      >=84 ==================================================================== Declared Medications:  The flagging and interpretation on this report are based on the  following declared medications.  Unexpected results may arise from  inaccuracies in the declared medications.    **Note: The testing scope of this panel includes these medications:   Hydrocodone (Norco)   **Note: The testing scope of this panel does not include the  following reported medications:   Acetaminophen (Norco)  Albuterol (Ventolin HFA)  Aripiprazole (Abilify)  Aspirin  Budesonide (Symbicort)  Cetirizine (Zyrtec)  Cysteamine (Procysbi)  Docusate (Colace)  Dulaglutide (Trulicity)  Famotidine (Pepcid)  Fluticasone (Flonase)  Formoterol (Symbicort)  Glipizide (Glucotrol)  Insulin (NovoLog)  Linaclotide (Linzess)  Losartan (Cozaar)  Magnesium (Mag-Ox)  Meclizine (Antivert)  Metformin (Glucophage)  Mirtazapine (Remeron)  Montelukast (Singulair)  Omeprazole (Prilosec)  Ondansetron (Zofran)  Pregabalin (Lyrica)  Propranolol (Inderal)  Rizatriptan (Maxalt)  Rosuvastatin (Crestor)  Solifenacin (Vesicare)  Topical Lidocaine (Lidoderm)  Topiramate (Topamax)  Trazodone (Desyrel)  Venlafaxine (Effexor) ====================================================================  For clinical consultation, please call [phone]. ====================================================================       ROS  Constitutional: Denies any fever or chills Gastrointestinal: No reported hemesis, hematochezia, vomiting, or acute GI distress Musculoskeletal: Denies any acute onset joint swelling, redness, loss of ROM, or weakness Neurological:  as above  Medication Review  ARIPiprazole, Accu-Chek Guide, Cysteamine Bitartrate, HYDROcodone-acetaminophen, albuterol, aspirin, budesonide-formoterol, cetirizine, docusate sodium, famotidine, fluticasone, glipiZIDE, glucose blood, lidocaine, linaclotide, magnesium oxide, meclizine, metFORMIN, mirtazapine, montelukast, omeprazole, ondansetron, pregabalin, propranolol, rizatriptan, rosuvastatin, solifenacin, topiramate, traZODone, and venlafaxine XR  History Review  Allergy: [name] is allergic to tramadol, benadryl [diphenhydramine hcl],  bextra [valdecoxib], invokana [canagliflozin], and sulfamethoxazole-trimethoprim. Drug: [name]  reports that she does not currently use drugs. Alcohol:  reports current alcohol use of about 2.0 standard drinks of alcohol per week. Tobacco:  reports that she has quit smoking. Her smoking use included cigarettes. She has a 36 pack-year smoking history. She has been exposed to tobacco smoke. She has quit using smokeless tobacco. Social: [name]  reports that she has quit smoking. Her smoking use included cigarettes. She has a 36 pack-year smoking history. She has been exposed to tobacco smoke. She has quit using smokeless tobacco. She reports current alcohol use of about 2.0 standard drinks of alcohol per week. She reports that she does not currently use drugs. Medical:  has a past medical history of Abnormal CBC, Asthma, Asthma, BPPV (benign paroxysmal positional vertigo), Cardiac murmur, unspecified (08/28/2016), COPD (chronic obstructive pulmonary disease) (HCC), CPAP (continuous positive airway pressure) dependence, Degenerative disc disease, lumbar, Depression, Diabetes mellitus, Diverticulitis (10/2016), Elevated LFTs, Fatty liver, Fibromyalgia, Fibromyalgia, GERD (gastroesophageal reflux disease), Headache, History of degenerative disc disease, Hyperlipidemia, Hypertension, and Sleep apnea. Surgical: [name]  has a past surgical history that includes Abdominal surgery; Abdominal hysterectomy; Esophagogastroduodenoscopy (egd) with propofol (N/A, 02/20/2017); Colonoscopy with propofol (N/A, 02/20/2017); Upper esophageal endoscopic ultrasound (eus) (N/A, 04/03/2017); Diverticulitis; EUS (N/A, 01/08/2018); Cholecystectomy (N/A, 04/27/2018); Cataract extraction w/PHACO (Left, 09/07/2019); Cataract extraction w/PHACO (Right, 09/28/2019); and RIGHT/LEFT HEART CATH AND CORONARY ANGIOGRAPHY (Bilateral, 12/19/2021). Family: family history includes Diabetes in her mother; Hypertension in her  mother.  Laboratory Chemistry Profile   Renal Lab Results  Component Value Date   BUN 20 04/19/2022   CREATININE 0.52 04/19/2022   BCR 14 11/27/2017   GFRAA >60 08/09/2019   GFRNONAA >60 04/19/2022    Hepatic Lab Results  Component Value Date   AST 10 (L) 04/19/2022   ALT 36 04/19/2022   ALBUMIN 3.9 04/19/2022   ALKPHOS 138 (H) 04/19/2022   LIPASE 47 04/19/2022   AMMONIA 46 (H) 04/05/2021    Electrolytes Lab Results  Component Value Date   NA 138 04/19/2022   K 3.7 04/19/2022   CL 107 04/19/2022   CALCIUM 9.8 04/19/2022   MG 1.4 (L) 04/19/2022    Bone No results found for: "VD25OH", "VD125OH2TOT", "UJ8119JY7", "WG9562ZH0", "25OHVITD1", "25OHVITD2", "25OHVITD3", "TESTOFREE", "TESTOSTERONE"  Inflammation (CRP: Acute Phase) (ESR: Chronic Phase) Lab Results  Component Value Date   CRP <1 11/27/2017   ESRSEDRATE 17 11/27/2017   LATICACIDVEN 1.3 04/05/2021         Note: Above Lab results reviewed.  CLINICAL DATA:  63 year old female with lumbar back pain radiating to both legs, knees. Symptoms for 1 months with no known injury.   EXAM: MRI LUMBAR SPINE WITHOUT CONTRAST   TECHNIQUE: Multiplanar, multisequence MR imaging of the lumbar spine was performed. No intravenous contrast was administered.   COMPARISON:  CT Abdomen and Pelvis 10/14/2016.  FINDINGS: Segmentation:  Normal as demonstrated on the comparison CT.   Alignment: Stable vertebral height and alignment, within normal limits.   Vertebrae: No marrow edema or evidence of acute osseous abnormality. Visualized bone marrow signal is within normal limits. Negative visible sacrum and SI joints.   Conus medullaris: Extends to the L1-L2. Level and appears normal.   Paraspinal and other soft tissues: Visible abdominal viscera today and the paraspinal soft tissues are within normal limits.   Disc levels:   T11-T12: Mild circumferential disc bulge. Mild to moderate facet hypertrophy greater on the  left. Moderate bilateral T11 foraminal stenosis.   T12-L1:  Minimal disc bulge.  No stenosis.   L1-L2: Mild left eccentric circumferential disc bulge. No stenosis.   L2-L3: Mild mostly far lateral disc bulging. Small central disc extrusion (series 2, image 8). Mild facet hypertrophy. No stenosis.   L3-L4: Mild far lateral disc bulging. Mild facet and ligament flavum hypertrophy. No stenosis.   L4-L5: Mild far lateral disc bulging. Mild facet and ligament flavum hypertrophy. No stenosis.   L5-S1: Negative disc. Mild facet and ligament flavum hypertrophy. Mild epidural lipomatosis. No stenosis.  Physical Exam  General appearance: Well nourished, well developed, and well hydrated. In no apparent acute distress Mental status: Alert, oriented x 3 (person, place, & time)       Respiratory: No evidence of acute respiratory distress Eyes: PERLA Vitals: BP (!) 155/74 (BP Location: Right Arm, Patient Position: Sitting, Cuff Size: Normal)   Pulse 77   Temp 97.9 F (36.6 C) (Temporal)   Resp 16   Ht 4\' 11"  (1.499 m)   Wt 87 lb (39.5 kg)   LMP  (LMP Unknown) Comment: "many years"  SpO2 97%   BMI 17.57 kg/m  BMI: Estimated body mass index is 17.57 kg/m as calculated from the following:   Height as of this encounter: 4\' 11"  (1.499 m).   Weight as of this encounter: 87 lb (39.5 kg). Ideal: Female patients must weigh at least 45.5 kg to calculate ideal body weight  Lumbar Spine Area Exam  Skin & Axial Inspection: No masses, redness, or swelling Alignment: Symmetrical Functional ROM: Unrestricted ROM       Stability: No instability detected Muscle Tone/Strength: Functionally intact. No obvious neuro-muscular anomalies detected. Sensory (Neurological): Dermatomal pain pattern Palpation: No palpable anomalies       Provocative Tests: Hyperextension/rotation test: deferred today       Lumbar quadrant test (Kemp's test): (+) bilateral for foraminal stenosis Lateral bending test: (+)  ipsilateral radicular pain, bilaterally. Positive for bilateral foraminal stenosis.  Gait & Posture Assessment  Ambulation: Unassisted Gait: Relatively normal for age and body habitus Posture: WNL  Lower Extremity Exam    Side: Right lower extremity  Side: Left lower extremity  Stability: No instability observed          Stability: No instability observed          Skin & Extremity Inspection: Skin color, temperature, and hair growth are WNL. No peripheral edema or cyanosis. No masses, redness, swelling, asymmetry, or associated skin lesions. No contractures.  Skin & Extremity Inspection: Skin color, temperature, and hair growth are WNL. No peripheral edema or cyanosis. No masses, redness, swelling, asymmetry, or associated skin lesions. No contractures.  Functional ROM: Unrestricted ROM                  Functional ROM: Unrestricted ROM  Muscle Tone/Strength: Functionally intact. No obvious neuro-muscular anomalies detected.  Muscle Tone/Strength: Functionally intact. No obvious neuro-muscular anomalies detected.  Sensory (Neurological): Unimpaired        Sensory (Neurological): Unimpaired        DTR: Patellar: deferred today Achilles: deferred today Plantar: deferred today  DTR: Patellar: deferred today Achilles: deferred today Plantar: deferred today  Palpation: No palpable anomalies  Palpation: No palpable anomalies    Assessment   Diagnosis  1. Lumbar radiculopathy   2. Fibromyalgia   3. Chronic pain syndrome   4. Chronic myofascial pain   5. Arthritis of left knee   6. Primary osteoarthritis of right knee   7. Chronic, continuous use of opioids      Updated Problems: No problems updated.  Plan of Care  Problem-specific:  Assessment and Plan    Lumbar radiculopathy  Chronic leg pain and weakness affect her entire leg, leading to two falls. She experiences severe, persistent pain and fears walking due to fall risk. The pain is likely neuropathic/  radicular given radiating pain description. She reports right buttock pain. An epidural injection is considered for pain management, with Valium sedation for relaxation and anxiety reduction. A trigger point injection is planned for the right buttock during the epidural procedure. Schedule the epidural injection in approximately three weeks with Valium sedation. Perform the trigger point injection in the right buttock during the epidural procedure. Encourage use of an assistive device to minimize fall risk. Recommend exercises to maintain muscle strength, such as using a foot pedal exerciser.  Arthritis   Chronic arthritis is managed with naproxen for inflammation and gabapentin for associated leg pain. Her back pain has improved, but leg pain persists. Continue the current medication regimen with naproxen and gabapentin.  Medication management   Hydrocodone prescription management was discussed, with the next refill scheduled for March 30th. She has changed to a new pharmacy associated with [facility]. Refill the hydrocodone prescription on March 30th. Update pharmacy information in the system.       [name] has a current medication list which includes the following long-term medication(s): albuterol, aripiprazole, budesonide-formoterol, famotidine, metformin, mirtazapine, pregabalin, propranolol, rizatriptan, topiramate, trazodone, venlafaxine xr, and omeprazole.  Pharmacotherapy (Medications Ordered): Meds ordered this encounter  Medications   HYDROcodone-acetaminophen (NORCO) 7.5-325 MG tablet    Sig: Take 1 tablet by mouth 3 (three) times daily as needed for severe pain (pain score 7-10). Must last 30 days    Dispense:  90 tablet    Refill:  0    Chronic Pain: STOP Act (Not applicable) Fill 1 day early if closed on refill date. Avoid benzodiazepines within 8 hours of opioids   HYDROcodone-acetaminophen (NORCO) 7.5-325 MG tablet    Sig: Take 1 tablet by mouth 3  (three) times daily as needed for severe pain (pain score 7-10). Must last 30 days    Dispense:  90 tablet    Refill:  0    Chronic Pain: STOP Act (Not applicable) Fill 1 day early if closed on refill date. Avoid benzodiazepines within 8 hours of opioids   HYDROcodone-acetaminophen (NORCO) 7.5-325 MG tablet    Sig: Take 1 tablet by mouth 3 (three) times daily as needed for severe pain (pain score 7-10). Must last 30 days    Dispense:  90 tablet    Refill:  0    Chronic Pain: STOP Act (Not applicable) Fill 1 day early if closed on refill date. Avoid benzodiazepines within 8 hours of  opioids   Orders:  Orders Placed This Encounter  Procedures   Lumbar Epidural Injection    Standing Status:   Future    Expiration Date:   09/10/2023    Scheduling Instructions:     Procedure: Interlaminar Lumbar Epidural Steroid injection (LESI)            Laterality: Midline     Sedation: PO Valium     Timeframe: ASAA    Where will this procedure be performed?:   [facility]   TRIGGER POINT INJECTION    Standing Status:   Future    Expected Date:   07/09/2023    Expiration Date:   09/10/2023    Scheduling Instructions:     Right lumbar TPi    Where will this procedure be performed?:   [facility]   Follow-up plan:   Return in about 29 days (around 07/09/2023) for Lumbar ESI + Lumbar TPI, in clinic (PO Valium 5mg ).      Recent Visits Date Type Provider Dept  03/13/23 Office Visit Edward Jolly, MD [facility]  Showing recent visits within past 90 days and meeting all other requirements Today's Visits Date Type Provider Dept  06/10/23 Office Visit Edward Jolly, MD [facility]  Showing today's visits and meeting all other requirements Future Appointments No visits were found meeting these conditions. Showing future appointments within next 90 days and meeting all other requirements  I discussed the assessment and treatment plan with the patient. The patient  was provided an opportunity to ask questions and all were answered. The patient agreed with the plan and demonstrated an understanding of the instructions.  Patient advised to call back or seek an in-person evaluation if the symptoms or condition worsens.  Duration of encounter: .  Total time on encounter, as per AMA guidelines included both the face-to-face and non-face-to-face time personally spent by the physician and/or other qualified health care professional(s) on the day of the encounter (includes time in activities that require the physician or other qualified health care professional and does not include time in activities normally performed by clinical staff). Physician's time may include the following activities when performed: Preparing to see the patient (e.g., pre-charting review of records, searching for previously ordered imaging, lab work, and nerve conduction tests) Review of prior analgesic pharmacotherapies. Reviewing PMP Interpreting ordered tests (e.g., lab work, imaging, nerve conduction tests) Performing post-procedure evaluations, including interpretation of diagnostic procedures Obtaining and/or reviewing separately obtained history Performing a medically appropriate examination and/or evaluation Counseling and educating the patient/family/caregiver Ordering medications, tests, or procedures Referring and communicating with other health care professionals (when not separately reported) Documenting clinical information in the electronic or other health record Independently interpreting results (not separately reported) and communicating results to the patient/ family/caregiver Care coordination (not separately reported)  Note by: Edward Jolly, MD Date: 06/10/2023; Time: 10:30 AM

## 2023-06-10 NOTE — Patient Instructions (Signed)
 ______________________________________________________________________    General Risks and Possible Complications  Patient Responsibilities: It is important that you read this as it is part of your informed consent. It is our duty to inform you of the risks and possible complications associated with treatments offered to you. It is your responsibility as a patient to read this and to ask questions about anything that is not clear or that you believe was not covered in this document.  Patient's Rights: You have the right to refuse treatment. You also have the right to change your mind, even after initially having agreed to have the treatment done. However, under this last option, if you wait until the last second to change your mind, you may be charged for the materials used up to that point.  Introduction: Medicine is not an Visual merchandiser. Everything in Medicine, including the lack of treatment(s), carries the potential for danger, harm, or loss (which is by definition: Risk). In Medicine, a complication is a secondary problem, condition, or disease that can aggravate an already existing one. All treatments carry the risk of possible complications. The fact that a side effects or complications occurs, does not imply that the treatment was conducted incorrectly. It must be clearly understood that these can happen even when everything is done following the highest safety standards.  No treatment: You can choose not to proceed with the proposed treatment alternative. The "PRO(s)" would include: avoiding the risk of complications associated with the therapy. The "CON(s)" would include: not getting any of the treatment benefits. These benefits fall under one of three categories: diagnostic; therapeutic; and/or palliative. Diagnostic benefits include: getting information which can ultimately lead to improvement of the disease or symptom(s). Therapeutic benefits are those associated with the successful  treatment of the disease. Finally, palliative benefits are those related to the decrease of the primary symptoms, without necessarily curing the condition (example: decreasing the pain from a flare-up of a chronic condition, such as incurable terminal cancer).  General Risks and Complications: These are associated to most interventional treatments. They can occur alone, or in combination. They fall under one of the following six (6) categories: no benefit or worsening of symptoms; bleeding; infection; nerve damage; allergic reactions; and/or death. No benefits or worsening of symptoms: In Medicine there are no guarantees, only probabilities. No healthcare provider can ever guarantee that a medical treatment will work, they can only state the probability that it may. Furthermore, there is always the possibility that the condition may worsen, either directly, or indirectly, as a consequence of the treatment. Bleeding: This is more common if the patient is taking a blood thinner, either prescription or over the counter (example: Goody Powders, Fish oil, Aspirin, Garlic, etc.), or if suffering a condition associated with impaired coagulation (example: Hemophilia, cirrhosis of the liver, low platelet counts, etc.). However, even if you do not have one on these, it can still happen. If you have any of these conditions, or take one of these drugs, make sure to notify your treating physician. Infection: This is more common in patients with a compromised immune system, either due to disease (example: diabetes, cancer, human immunodeficiency virus [HIV], etc.), or due to medications or treatments (example: therapies used to treat cancer and rheumatological diseases). However, even if you do not have one on these, it can still happen. If you have any of these conditions, or take one of these drugs, make sure to notify your treating physician. Nerve Damage: This is more common when the treatment is  an invasive one, but it  can also happen with the use of medications, such as those used in the treatment of cancer. The damage can occur to small secondary nerves, or to large primary ones, such as those in the spinal cord and brain. This damage may be temporary or permanent and it may lead to impairments that can range from temporary numbness to permanent paralysis and/or brain death. Allergic Reactions: Any time a substance or material comes in contact with our body, there is the possibility of an allergic reaction. These can range from a mild skin rash (contact dermatitis) to a severe systemic reaction (anaphylactic reaction), which can result in death. Death: In general, any medical intervention can result in death, most of the time due to an unforeseen complication. ______________________________________________________________________      ______________________________________________________________________    Preparing for your procedure  Appointments: If you think you may not be able to keep your appointment, call 24-48 hours in advance to cancel. We need time to make it available to others.  Procedure visits are for procedures only. During your procedure appointment there will be: NO Prescription Refills*. NO medication changes or discussions*. NO discussion of disability issues*. NO unrelated pain problem evaluations*. NO evaluations to order other pain procedures*. *These will be addressed at a separate and distinct evaluation encounter on the provider's evaluation schedule and not during procedure days.  Instructions: Food intake: Avoid eating anything solid for at least 8 hours prior to your procedure. Clear liquid intake: You may take clear liquids such as water up to 2 hours prior to your procedure. (No carbonated drinks. No soda.) Transportation: Unless otherwise stated by your physician, bring a driver. (Driver cannot be a Market researcher, Pharmacist, community, or any other form of public transportation.) Morning  Medicines: Except for blood thinners, take all of your other morning medications with a sip of water. Make sure to take your heart and blood pressure medicines. If your blood pressure's lower number is above 100, the case will be rescheduled. Blood thinners: Make sure to stop your blood thinners as instructed.  If you take a blood thinner, but were not instructed to stop it, call our office [phone] and ask to talk to a nurse. Not stopping a blood thinner prior to certain procedures could lead to serious complications. Diabetics on insulin: Notify the staff so that you can be scheduled 1st case in the morning. If your diabetes requires high dose insulin, take only  of your normal insulin dose the morning of the procedure and notify the staff that you have done so. Preventing infections: Shower with an antibacterial soap the morning of your procedure.  Build-up your immune system: Take 1000 mg of Vitamin C with every meal (3 times a day) the day prior to your procedure. Antibiotics: Inform the nursing staff if you are taking any antibiotics or if you have any conditions that may require antibiotics prior to procedures. (Example: recent joint implants)   Pregnancy: If you are pregnant make sure to notify the nursing staff. Not doing so may result in injury to the fetus, including death.  Sickness: If you have a cold, fever, or any active infections, call and cancel or reschedule your procedure. Receiving steroids while having an infection may result in complications. Arrival: You must be in the facility at least 30 minutes prior to your scheduled procedure. Tardiness: Your scheduled time is also the cutoff time. If you do not arrive at least 15 minutes prior to your procedure, you will  be rescheduled.  Children: Do not bring any children with you. Make arrangements to keep them home. Dress appropriately: There is always a possibility that your clothing may get soiled. Avoid long dresses. Valuables:  Do not bring any jewelry or valuables.  Reasons to call and reschedule or cancel your procedure: (Following these recommendations will minimize the risk of a serious complication.) Surgeries: Avoid having procedures within 2 weeks of any surgery. (Avoid for 2 weeks before or after any surgery). Flu Shots: Avoid having procedures within 2 weeks of a flu shots or . (Avoid for 2 weeks before or after immunizations). Barium: Avoid having a procedure within 7-10 days after having had a radiological study involving the use of radiological contrast. (Myelograms, Barium swallow or enema study). Heart attacks: Avoid any elective procedures or surgeries for the initial 6 months after a "Myocardial Infarction" (Heart Attack). Blood thinners: It is imperative that you stop these medications before procedures. Let us know if you if you take any blood thinner.  Infection: Avoid procedures during or within two weeks of an infection (including chest colds or gastrointestinal problems). Symptoms associated with infections include: Localized redness, fever, chills, night sweats or profuse sweating, burning sensation when voiding, cough, congestion, stuffiness, runny nose, sore throat, diarrhea, nausea, vomiting, cold or Flu symptoms, recent or current infections. It is specially important if the infection is over the area that we intend to treat. Heart and lung problems: Symptoms that may suggest an active cardiopulmonary problem include: cough, chest pain, breathing difficulties or shortness of breath, dizziness, ankle swelling, uncontrolled high or unusually low blood pressure, and/or palpitations. If you are experiencing any of these symptoms, cancel your procedure and contact your primary care physician for an evaluation.  Remember:  Regular Business hours are:  Monday to Thursday 8:00 AM to 4:00 PM  Provider's Schedule: Delano Metz, MD:  Procedure days: Tuesday and Thursday 7:30 AM to 4:00 PM  Edward Jolly, MD:  Procedure days: Monday and Wednesday 7:30 AM to 4:00 PM Last  Updated: 03/04/2023 ______________________________________________________________________     Trigger Point Injection Trigger points are areas where you have pain. A trigger point injection is a shot given in the trigger point to help relieve pain for a few days to a few months. Common places for trigger points include the neck, shoulders, upper back, or lower back. A trigger point injection will not cure long-term (chronic) pain permanently. These injections do not always work for every person. For some people, they can help to relieve pain for a few days to a few months. Tell a health care provider about: Any allergies you have. All medicines you are taking, including vitamins, herbs, eye drops, creams, and over-the-counter medicines. Any problems you or family members have had with anesthetic medicines. Any bleeding problems you have. Any surgeries you have had. Any medical conditions you have. Whether you are pregnant or may be pregnant. What are the risks? Generally, this is a safe procedure. However, problems may occur, including: Infection. Bleeding or bruising. Allergic reaction to the injected medicine. Irritation of the skin around the injection site. What happens before the procedure? Ask your health care provider about: Changing or stopping your regular medicines. This is especially important if you are taking diabetes medicines or blood thinners. Taking medicines such as aspirin and ibuprofen. These medicines can thin your blood. Do not take these medicines unless your health care provider tells you to take them. Taking over-the-counter medicines, vitamins, herbs, and supplements. What happens during the  procedure?  Your health care provider will feel for trigger points. A marker may be used to circle the area for the injection. The skin over the trigger point will be washed with a germ-killing  soap. You may be given a medicine to help you relax (sedative). A thin needle is used for the injection. You may feel pain or a twitching feeling when the needle enters your skin. A numbing solution may be injected into the trigger point. Sometimes a medicine to keep down inflammation is also injected. Your health care provider may move the needle around the area where the trigger point is located until the tightness and twitching goes away. After the injection, your health care provider may put gentle pressure over the injection site. The injection site will be covered with a bandage (dressing). The procedure may vary among health care providers and hospitals. What can I expect after treatment? After treatment, you may have soreness and stiffness for 1-2 days. Follow these instructions at home: Injection site care Remove your dressing in a few hours, or as told by your health care provider. Check your injection site every day for signs of infection. Check for: Redness, swelling, or pain. Fluid or blood. Warmth. Pus or a bad smell. Managing pain, stiffness, and swelling If directed, put ice on the affected area. To do this: Put ice in a plastic bag. Place a towel between your skin and the bag. Leave the ice on for 20 minutes, 2-3 times a day. Remove the ice if your skin turns bright red. This is very important. If you cannot feel pain, heat, or cold, you have a greater risk of damage to the area. Activity If you were given a sedative during the procedure, it can affect you for several hours. Do not drive or operate machinery until your health care provider says that it is safe. Do not take baths, swim, or use a hot tub until your health care provider approves. Return to your normal activities as told by your health care provider. Ask your health care provider what activities are safe for you. General instructions If you were asked to stop your regular medicines, ask your health care  provider when you may start taking them again. You may be asked to see an occupational or physical therapist for exercises that reduce muscle strain and stretch the area of the trigger point. Keep all follow-up visits. This is important. Contact a health care provider if: Your pain comes back, and it is worse than before the injection. You may need more injections. You have chills or a fever. The injection site becomes more painful, red, swollen, or warm to the touch. Summary A trigger point injection is a shot given in the trigger point to help relieve pain. Common places for trigger point injections are the neck, shoulders, upper back, and lower back. These injections do not always work for every person, but for some people, the injections can help to relieve pain for a few days to a few months. Contact a health care provider if symptoms come back or if they are worse than before treatment. Also, get help if the injection site becomes more painful, red, swollen, or warm to the touch. This information is not intended to replace advice given to you by your health care provider. Make sure you discuss any questions you have with your health care provider. Document Revised: 06/20/2020 Document Reviewed: 06/20/2020 Elsevier Patient Education  2024 [employer]Epidural Steroid Injection Patient Information  Description: The  epidural space surrounds the nerves as they exit the spinal cord.  In some patients, the nerves can be compressed and inflamed by a bulging disc or a tight spinal canal (spinal stenosis).  By injecting steroids into the epidural space, we can bring irritated nerves into direct contact with a potentially helpful medication.  These steroids act directly on the irritated nerves and can reduce swelling and inflammation which often leads to decreased pain.  Epidural steroids may be injected anywhere along the spine and from the neck to the low back depending upon the location of your  pain.   After numbing the skin with local anesthetic (like Novocaine), a small needle is passed into the epidural space slowly.  You may experience a sensation of pressure while this is being done.  The entire block usually last less than 10 minutes.  Conditions which may be treated by epidural steroids:  Low back and leg pain Neck and arm pain Spinal stenosis Post-laminectomy syndrome Herpes zoster (shingles) pain Pain from compression fractures  Preparation for the injection:  Do not eat any solid food or dairy products within 8 hours of your appointment.  You may drink clear liquids up to 3 hours before appointment.  Clear liquids include water, black coffee, juice or soda.  No milk or cream please. You may take your regular medication, including pain medications, with a sip of water before your appointment  Diabetics should hold regular insulin (if taken separately) and take 1/2 normal NPH dos the morning of the procedure.  Carry some sugar containing items with you to your appointment. A driver must accompany you and be prepared to drive you home after your procedure.  Bring all your current medications with your. An IV may be inserted and sedation may be given at the discretion of the physician.   A blood pressure cuff, EKG and other monitors will often be applied during the procedure.  Some patients may need to have extra oxygen administered for a short period. You will be asked to provide medical information, including your allergies, prior to the procedure.  We must know immediately if you are taking blood thinners (like Coumadin/Warfarin)  Or if you are allergic to IV iodine contrast (dye). We must know if you could possible be pregnant.  Possible side-effects: Bleeding from needle site Infection (rare, may require surgery) Nerve injury (rare) Numbness & tingling (temporary) Difficulty urinating (rare, temporary) Spinal headache ( a headache worse with upright posture) Light  -headedness (temporary) Pain at injection site (several days) Decreased blood pressure (temporary) Weakness in arm/leg (temporary) Pressure sensation in back/neck (temporary)  Call if you experience: Fever/chills associated with headache or increased back/neck pain. Headache worsened by an upright position. New onset weakness or numbness of an extremity below the injection site Hives or difficulty breathing (go to the emergency room) Inflammation or drainage at the infection site Severe back/neck pain Any new symptoms which are concerning to you  Please note:  Although the local anesthetic injected can often make your back or neck feel good for several hours after the injection, the pain will likely return.  It takes 3-7 days for steroids to work in the epidural space.  You may not notice any pain relief for at least that one week.  If effective, we will often do a series of three injections spaced 3-6 weeks apart to maximally decrease your pain.  After the initial series, we generally will wait several months before considering a repeat injection of the same  type.  If you have any questions, please call [phone] [facility]

## 2023-06-10 NOTE — Progress Notes (Signed)
 Nursing Pain Medication Assessment:  Safety precautions to be maintained throughout the outpatient stay will include: orient to surroundings, keep bed in low position, maintain call bell within reach at all times, provide assistance with transfer out of bed and ambulation.  Medication Inspection Compliance: Pill count conducted under aseptic conditions, in front of the patient. Neither the pills nor the bottle was removed from the patient's sight at any time. Once count was completed pills were immediately returned to the patient in their original bottle.  Medication: Hydrocodone/APAP Pill/Patch Count:  43 of 90 pills remain Pill/Patch Appearance: Markings consistent with prescribed medication Bottle Appearance: Standard pharmacy container. Clearly labeled. Filled Date: 02 / 28 / 2025 Last Medication intake:  Today

## 2023-06-13 ENCOUNTER — Telehealth: Payer: Self-pay

## 2023-06-13 ENCOUNTER — Other Ambulatory Visit: Payer: Self-pay | Admitting: *Deleted

## 2023-06-13 DIAGNOSIS — M5416 Radiculopathy, lumbar region: Secondary | ICD-10-CM

## 2023-06-13 DIAGNOSIS — M1712 Unilateral primary osteoarthritis, left knee: Secondary | ICD-10-CM

## 2023-06-13 DIAGNOSIS — G8929 Other chronic pain: Secondary | ICD-10-CM

## 2023-06-13 DIAGNOSIS — G894 Chronic pain syndrome: Secondary | ICD-10-CM

## 2023-06-13 DIAGNOSIS — M1711 Unilateral primary osteoarthritis, right knee: Secondary | ICD-10-CM

## 2023-06-13 DIAGNOSIS — F1190 Opioid use, unspecified, uncomplicated: Secondary | ICD-10-CM

## 2023-06-13 NOTE — Telephone Encounter (Signed)
 She called and said she needs her scripts hydrocodone and Lyrica called to [facility] in [place]. She has switched pharmacies.

## 2023-06-13 NOTE — Telephone Encounter (Signed)
 Rx request sent to Dr. Cherylann Ratel. I informed her that she is due to fill Hydrocodone on 06-22-23, but the Rx will not be sent in until 06-23-23. She is ok with that.

## 2023-06-19 ENCOUNTER — Encounter: Payer: MEDICAID | Admitting: Student in an Organized Health Care Education/Training Program

## 2023-06-19 ENCOUNTER — Telehealth: Payer: Self-pay | Admitting: Student in an Organized Health Care Education/Training Program

## 2023-06-19 NOTE — Telephone Encounter (Signed)
 Patient states scripts should have been sent to [name]. But they say they have no scripts. Please. Let patient know status

## 2023-06-19 NOTE — Telephone Encounter (Signed)
 Called patient back we have discussed medications and what to do until Monday. A message was received that [name] would not fill them. Patient to call us back to where she would like medication to go.

## 2023-06-19 NOTE — Telephone Encounter (Signed)
 Did you call the pharmacy [facility]? And [facility] is closed on the Weekend so she will also need permission to fill script early, (Friday) if [facility] has the script.

## 2023-06-19 NOTE — Telephone Encounter (Signed)
 Talked with patient and informed her that medication was sent to [facility] and can be filled on 06/22/22  She states that [facility] said they didn't have prescription and could not fill it. Patient to call back Monday to let us know what to do.

## 2023-06-23 MED ORDER — HYDROCODONE-ACETAMINOPHEN 7.5-325 MG PO TABS: 1.0000 | ORAL_TABLET | Freq: Three times a day (TID) | ORAL | 0 refills | Status: AC | PRN

## 2023-06-23 MED ORDER — PREGABALIN 100 MG PO CAPS: 100.0000 mg | ORAL_CAPSULE | Freq: Three times a day (TID) | ORAL | 5 refills | Status: AC

## 2023-06-23 MED ORDER — HYDROCODONE-ACETAMINOPHEN 7.5-325 MG PO TABS: 1.0000 | ORAL_TABLET | Freq: Three times a day (TID) | ORAL | 0 refills | Status: DC | PRN

## 2023-07-09 ENCOUNTER — Encounter: Payer: Self-pay | Admitting: Student in an Organized Health Care Education/Training Program

## 2023-07-09 ENCOUNTER — Ambulatory Visit
Payer: MEDICAID | Attending: Student in an Organized Health Care Education/Training Program | Admitting: Student in an Organized Health Care Education/Training Program

## 2023-07-09 ENCOUNTER — Ambulatory Visit
Admission: RE | Admit: 2023-07-09 | Discharge: 2023-07-09 | Disposition: A | Discharge status: Home / Self Care | Payer: MEDICAID | Source: Ambulatory Visit | Attending: Student in an Organized Health Care Education/Training Program | Admitting: Student in an Organized Health Care Education/Training Program

## 2023-07-09 VITALS — BP 158/100 | HR 80 | Temp 97.8°F | Resp 12 | Ht 59.0 in | Wt 87.0 lb

## 2023-07-09 DIAGNOSIS — G8929 Other chronic pain: Secondary | ICD-10-CM | POA: Diagnosis not present

## 2023-07-09 DIAGNOSIS — G894 Chronic pain syndrome: Secondary | ICD-10-CM

## 2023-07-09 DIAGNOSIS — M5416 Radiculopathy, lumbar region: Secondary | ICD-10-CM | POA: Diagnosis not present

## 2023-07-09 DIAGNOSIS — M7918 Myalgia, other site: Secondary | ICD-10-CM | POA: Insufficient documentation

## 2023-07-09 MED ORDER — DIAZEPAM 5 MG PO TABS
5.0000 mg | ORAL_TABLET | ORAL | Status: AC
  Administered 2023-07-09: 5 mg via ORAL

## 2023-07-09 MED ORDER — DEXAMETHASONE SODIUM PHOSPHATE 10 MG/ML IJ SOLN
10.0000 mg | Freq: Once | INTRAMUSCULAR | Status: AC
  Administered 2023-07-09: 10 mg
  Filled 2023-07-09: qty 1

## 2023-07-09 MED ORDER — SODIUM CHLORIDE (PF) 0.9 % IJ SOLN
INTRAMUSCULAR | Status: AC
  Filled 2023-07-09: qty 10

## 2023-07-09 MED ORDER — DIAZEPAM 5 MG PO TABS
ORAL_TABLET | ORAL | Status: AC
  Filled 2023-07-09: qty 1

## 2023-07-09 MED ORDER — ROPIVACAINE HCL 2 MG/ML IJ SOLN
2.0000 mL | Freq: Once | INTRAMUSCULAR | Status: AC
  Administered 2023-07-09: 2 mL via EPIDURAL
  Filled 2023-07-09: qty 20

## 2023-07-09 MED ORDER — LIDOCAINE HCL 2 % IJ SOLN
20.0000 mL | Freq: Once | INTRAMUSCULAR | Status: AC
  Administered 2023-07-09: 400 mg
  Filled 2023-07-09: qty 40

## 2023-07-09 MED ORDER — IOHEXOL 180 MG/ML  SOLN
10.0000 mL | Freq: Once | INTRAMUSCULAR | Status: AC
  Administered 2023-07-09: 10 mL via EPIDURAL
  Filled 2023-07-09: qty 20

## 2023-07-09 MED ORDER — SODIUM CHLORIDE 0.9% FLUSH
2.0000 mL | Freq: Once | INTRAVENOUS | Status: AC
  Administered 2023-07-09: 2 mL

## 2023-07-09 NOTE — Progress Notes (Signed)
 Safety precautions to be maintained throughout the outpatient stay will include: orient to surroundings, keep bed in low position, maintain call bell within reach at all times, provide assistance with transfer out of bed and ambulation.

## 2023-07-09 NOTE — Patient Instructions (Signed)

## 2023-07-09 NOTE — Progress Notes (Signed)
 PROVIDER NOTE: Interpretation of information contained herein should be left to medically-trained personnel. Specific patient instructions are provided elsewhere under "Patient Instructions" section of medical record. This document was created in part using STT-dictation technology, any transcriptional errors that may result from this process are unintentional.  Patient: [name] Type: Established DOB: 15-Oct-1960 MRN: [id number] PCP: Julee Oak, MD  Service: Procedure DOS: 07/09/2023 Setting: Ambulatory Location: Ambulatory outpatient facility Delivery: Face-to-face Provider: Cephus Collin, MD Specialty: Interventional Pain Management Specialty designation: 09 Location: Outpatient facility Ref. Prov.: Cephus Collin, MD       Interventional Therapy   Type: Lumbar epidural steroid injection (LESI) (interlaminar) #1  & Lumbar TPI Laterality: Right   Level:  L4-5 Level.  Imaging: Fluoroscopic guidance         Anesthesia: Local anesthesia (1-2% Lidocaine) Sedation: Minimal Sedation                       DOS: 07/09/2023  Performed by: Cephus Collin, MD  Purpose: Diagnostic/Therapeutic Indications: Lumbar radicular pain of intraspinal etiology of more than 4 weeks that has failed to respond to conservative therapy and is severe enough to impact quality of life or function. 1. Lumbar radiculopathy   2. Chronic pain syndrome    NAS-11 Pain score:   Pre-procedure: 8 /10   Post-procedure: 8 /10      Position / Prep / Materials:  Position: Prone w/ head of the table raised (slight reverse trendelenburg) to facilitate breathing.  Prep solution: ChloraPrep (2% chlorhexidine gluconate and 70% isopropyl alcohol) Prep Area: Entire Posterior Lumbar Region from lower scapular tip down to mid buttocks area and from flank to flank. Materials:  Tray: Epidural tray Needle(s):  Type: Epidural needle (Tuohy) Gauge (G):  22 Length: Regular (3.5-in) Qty: 1  H&P (Pre-op Assessment):   [name] is a 63 y.o. (year old), female patient, seen today for interventional treatment. She  has a past surgical history that includes Abdominal surgery; Abdominal hysterectomy; Esophagogastroduodenoscopy (egd) with propofol (N/A, 02/20/2017); Colonoscopy with propofol (N/A, 02/20/2017); Upper esophageal endoscopic ultrasound (eus) (N/A, 04/03/2017); Diverticulitis; EUS (N/A, 01/08/2018); Cholecystectomy (N/A, 04/27/2018); Cataract extraction w/PHACO (Left, 09/07/2019); Cataract extraction w/PHACO (Right, 09/28/2019); and RIGHT/LEFT HEART CATH AND CORONARY ANGIOGRAPHY (Bilateral, 12/19/2021). [name] has a current medication list which includes the following prescription(s): accu-chek guide, aspirin, accu-chek guide, cetirizine, docusate sodium, famotidine, hydrocodone-acetaminophen, [START ON 07/22/2023] hydrocodone-acetaminophen, [START ON 08/21/2023] hydrocodone-acetaminophen, linzess, meclizine, metformin, mirtazapine, omeprazole, ondansetron, pregabalin, propranolol, rizatriptan, topiramate, trazodone, venlafaxine xr, albuterol, aripiprazole, budesonide-formoterol, procysbi, fluticasone, lidocaine, magnesium oxide, montelukast, rosuvastatin, and solifenacin. Her primarily concern today is the Back Pain (lower)  Initial Vital Signs:  Pulse/HCG Rate: 80ECG Heart Rate: 81 Temp: 97.8 F (36.6 C) Resp: 16 BP: (!) 145/95 SpO2: 100 %  BMI: Estimated body mass index is 17.57 kg/m as calculated from the following:   Height as of this encounter: 4\' 11"  (1.499 m).   Weight as of this encounter: 87 lb (39.5 kg).  Risk Assessment: Allergies: Reviewed. She is allergic to tramadol, benadryl [diphenhydramine hcl], bextra [valdecoxib], invokana [canagliflozin], and sulfamethoxazole-trimethoprim.  Allergy Precautions: None required Coagulopathies: Reviewed. None identified.  Blood-thinner therapy: None at this time Active Infection(s): Reviewed. None identified. [name] is afebrile  Site  Confirmation: [name] was asked to confirm the procedure and laterality before marking the site Procedure checklist: Completed Consent: Before the procedure and under the influence of no sedative(s), amnesic(s), or anxiolytics, the patient was informed of the treatment options, risks and  possible complications. To fulfill our ethical and legal obligations, as recommended by the American Medical Association's Code of Ethics, I have informed the patient of my clinical impression; the nature and purpose of the treatment or procedure; the risks, benefits, and possible complications of the intervention; the alternatives, including doing nothing; the risk(s) and benefit(s) of the alternative treatment(s) or procedure(s); and the risk(s) and benefit(s) of doing nothing. The patient was provided information about the general risks and possible complications associated with the procedure. These may include, but are not limited to: failure to achieve desired goals, infection, bleeding, organ or nerve damage, allergic reactions, paralysis, and death. In addition, the patient was informed of those risks and complications associated to Spine-related procedures, such as failure to decrease pain; infection (i.e.: Meningitis, epidural or intraspinal abscess); bleeding (i.e.: epidural hematoma, subarachnoid hemorrhage, or any other type of intraspinal or peri-dural bleeding); organ or nerve damage (i.e.: Any type of peripheral nerve, nerve root, or spinal cord injury) with subsequent damage to sensory, motor, and/or autonomic systems, resulting in permanent pain, numbness, and/or weakness of one or several areas of the body; allergic reactions; (i.e.: anaphylactic reaction); and/or death. Furthermore, the patient was informed of those risks and complications associated with the medications. These include, but are not limited to: allergic reactions (i.e.: anaphylactic or anaphylactoid reaction(s)); adrenal axis  suppression; blood sugar elevation that in diabetics may result in ketoacidosis or comma; water retention that in patients with history of congestive heart failure may result in shortness of breath, pulmonary edema, and decompensation with resultant heart failure; weight gain; swelling or edema; medication-induced neural toxicity; particulate matter embolism and blood vessel occlusion with resultant organ, and/or nervous system infarction; and/or aseptic necrosis of one or more joints. Finally, the patient was informed that Medicine is not an exact science; therefore, there is also the possibility of unforeseen or unpredictable risks and/or possible complications that may result in a catastrophic outcome. The patient indicated having understood very clearly. We have given the patient no guarantees and we have made no promises. Enough time was given to the patient to ask questions, all of which were answered to the patient's satisfaction. Ms. Spagnuolo has indicated that she wanted to continue with the procedure. Attestation: I, the ordering provider, attest that I have discussed with the patient the benefits, risks, side-effects, alternatives, likelihood of achieving goals, and potential problems during recovery for the procedure that I have provided informed consent. Date  Time: 07/09/2023  8:48 AM  Pre-Procedure Preparation:  Monitoring: As per clinic protocol. Respiration, ETCO2, SpO2, BP, heart rate and rhythm monitor placed and checked for adequate function Safety Precautions: Patient was assessed for positional comfort and pressure points before starting the procedure. Time-out: I initiated and conducted the "Time-out" before starting the procedure, as per protocol. The patient was asked to participate by confirming the accuracy of the "Time Out" information. Verification of the correct person, site, and procedure were performed and confirmed by me, the nursing staff, and the patient. "Time-out"  conducted as per Joint Commission's Universal Protocol (UP.01.01.01). Time: 0921 Start Time: 0921 hrs.  Description/Narrative of Procedure:          Target: Epidural space via interlaminar opening, initially targeting the lower laminar border of the superior vertebral body. Region: Lumbar Approach: Percutaneous paravertebral  Rationale (medical necessity): procedure needed and proper for the diagnosis and/or treatment of the patient's medical symptoms and needs. Procedural Technique Safety Precautions: Aspiration looking for blood return was conducted prior to all injections. At  no point did we inject any substances, as a needle was being advanced. No attempts were made at seeking any paresthesias. Safe injection practices and needle disposal techniques used. Medications properly checked for expiration dates. SDV (single dose vial) medications used. Description of the Procedure: Protocol guidelines were followed. The procedure needle was introduced through the skin, ipsilateral to the reported pain, and advanced to the target area. Bone was contacted and the needle walked caudad, until the lamina was cleared. The epidural space was identified using "loss-of-resistance technique" with 2-3 ml of PF-NaCl (0.9% NSS), in a 5cc LOR glass syringe.  5cc solution made of 2cc of preservative-free saline, 2 cc of 0.2% ropivacaine, 1 cc of Decadron 10 mg/cc.  Afterwards a lumbar trigger point injection was also done in the lumbosacral area with 1 cc of 0.2% ropivacaine injected.  Needling was also performed.   Vitals:   07/09/23 0917 07/09/23 0920 07/09/23 0925 07/09/23 0926  BP: (!) 175/109 (!) 159/96 (!) 146/104   Pulse:      Resp: (!) 9 14 (!) 9 12  Temp:      SpO2: 99% 100% 98% 99%  Weight:      Height:        Start Time: 0921 hrs. End Time: 0926 hrs.  Imaging Guidance (Spinal):          Type of Imaging Technique: Fluoroscopy Guidance (Spinal) Indication(s): Fluoroscopy guidance for needle  placement to enhance accuracy in procedures requiring precise needle localization for targeted delivery of medication in or near specific anatomical locations not easily accessible without such real-time imaging assistance. Exposure Time: Please see nurses notes. Contrast: Before injecting any contrast, we confirmed that the patient did not have an allergy to iodine, shellfish, or radiological contrast. Once satisfactory needle placement was completed at the desired level, radiological contrast was injected. Contrast injected under live fluoroscopy. No contrast complications. See chart for type and volume of contrast used. Fluoroscopic Guidance: I was personally present during the use of fluoroscopy. "Tunnel Vision Technique" used to obtain the best possible view of the target area. Parallax error corrected before commencing the procedure. "Direction-depth-direction" technique used to introduce the needle under continuous pulsed fluoroscopy. Once target was reached, antero-posterior, oblique, and lateral fluoroscopic projection used confirm needle placement in all planes. Images permanently stored in EMR. Interpretation: I personally interpreted the imaging intraoperatively. Adequate needle placement confirmed in multiple planes. Appropriate spread of contrast into desired area was observed. No evidence of afferent or efferent intravascular uptake. No intrathecal or subarachnoid spread observed. Permanent images saved into the patient's record.  Antibiotic Prophylaxis:   Anti-infectives (From admission, onward)    None      Indication(s): None identified  Post-operative Assessment:  Post-procedure Vital Signs:  Pulse/HCG Rate: 8072 Temp: 97.8 F (36.6 C) Resp: 12 BP: (!) 146/104 SpO2: 99 %  EBL: None  Complications: No immediate post-treatment complications observed by team, or reported by patient.  Note: The patient tolerated the entire procedure well. A repeat set of vitals were taken  after the procedure and the patient was kept under observation following institutional policy, for this type of procedure. Post-procedural neurological assessment was performed, showing return to baseline, prior to discharge. The patient was provided with post-procedure discharge instructions, including a section on how to identify potential problems. Should any problems arise concerning this procedure, the patient was given instructions to immediately contact us, at any time, without hesitation. In any case, we plan to contact the patient by telephone for a  follow-up status report regarding this interventional procedure.  Comments:  No additional relevant information.  Plan of Care (POC)  Orders:  Orders Placed This Encounter  Procedures   DG PAIN CLINIC C-ARM 1-60 MIN NO REPORT    Intraoperative interpretation by procedural physician at [facility].    Standing Status:   Standing    Number of Occurrences:   1    Reason for exam::   Assistance in needle guidance and placement for procedures requiring needle placement in or near specific anatomical locations not easily accessible without such assistance.   Chronic Opioid Analgesic:  Norco 7.5 mg TID PRN #90/month   Medications ordered for procedure: Meds ordered this encounter  Medications   iohexol (OMNIPAQUE) 180 MG/ML injection 10 mL    Must be Myelogram-compatible. If not available, you may substitute with a water-soluble, non-ionic, hypoallergenic, myelogram-compatible radiological contrast medium.   lidocaine (XYLOCAINE) 2 % (with pres) injection 400 mg   diazepam (VALIUM) tablet 5 mg    Make sure Flumazenil is available in the pyxis when using this medication. If oversedation occurs, administer 0.2 mg IV over 15 sec. If after 45 sec no response, administer 0.2 mg again over 1 min; may repeat at 1 min intervals; not to exceed 4 doses (1 mg)   ropivacaine (PF) 2 mg/mL (0.2%) (NAROPIN) injection 2 mL   sodium chloride flush  (NS) 0.9 % injection 2 mL   dexamethasone (DECADRON) injection 10 mg   Medications administered: We administered iohexol, lidocaine, diazepam, ropivacaine (PF) 2 mg/mL (0.2%), sodium chloride flush, and dexamethasone.  See the medical record for exact dosing, route, and time of administration.  Follow-up plan:   Return for Keep sch. appt.       Recent Visits Date Type Provider Dept  06/10/23 Office Visit Cephus Collin, MD [facility]  Showing recent visits within past 90 days and meeting all other requirements Today's Visits Date Type Provider Dept  07/09/23 Procedure visit Cephus Collin, MD [facility]  Showing today's visits and meeting all other requirements Future Appointments Date Type Provider Dept  09/16/23 Appointment Cephus Collin, MD [facility]  Showing future appointments within next 90 days and meeting all other requirements  Disposition: Discharge home  Discharge (Date  Time): 07/09/2023;   hrs.   Primary Care Physician: Julee Oak, MD Location: [facility] Note by: Cephus Collin, MD (TTS technology used. I apologize for any typographical errors that were not detected and corrected.) Date: 07/09/2023; Time: 9:28 AM  Disclaimer:  Medicine is not an Visual merchandiser. The only guarantee in medicine is that nothing is guaranteed. It is important to note that the decision to proceed with this intervention was based on the information collected from the patient. The Data and conclusions were drawn from the patient's questionnaire, the interview, and the physical examination. Because the information was provided in large part by the patient, it cannot be guaranteed that it has not been purposely or unconsciously manipulated. Every effort has been made to obtain as much relevant data as possible for this evaluation. It is important to note that the conclusions that lead to this procedure are derived in large part from  the available data. Always take into account that the treatment will also be dependent on availability of resources and existing treatment guidelines, considered by other Pain Management Practitioners as being common knowledge and practice, at the time of the intervention. For Medico-Legal purposes, it is also important to point  out that variation in procedural techniques and pharmacological choices are the acceptable norm. The indications, contraindications, technique, and results of the above procedure should only be interpreted and judged by a Board-Certified Interventional Pain Specialist with extensive familiarity and expertise in the same exact procedure and technique.

## 2023-07-10 ENCOUNTER — Telehealth: Payer: Self-pay | Admitting: Student in an Organized Health Care Education/Training Program

## 2023-07-10 NOTE — Telephone Encounter (Signed)
 Attempt to contact for post-procedure f/u. No answer and VM not available.

## 2023-09-16 ENCOUNTER — Ambulatory Visit
Payer: MEDICAID | Attending: Student in an Organized Health Care Education/Training Program | Admitting: Student in an Organized Health Care Education/Training Program

## 2023-09-16 ENCOUNTER — Encounter: Payer: Self-pay | Admitting: Student in an Organized Health Care Education/Training Program

## 2023-09-16 DIAGNOSIS — M1711 Unilateral primary osteoarthritis, right knee: Secondary | ICD-10-CM | POA: Diagnosis present

## 2023-09-16 DIAGNOSIS — F1190 Opioid use, unspecified, uncomplicated: Secondary | ICD-10-CM | POA: Insufficient documentation

## 2023-09-16 DIAGNOSIS — M170 Bilateral primary osteoarthritis of knee: Secondary | ICD-10-CM

## 2023-09-16 DIAGNOSIS — G8929 Other chronic pain: Secondary | ICD-10-CM | POA: Diagnosis present

## 2023-09-16 DIAGNOSIS — M1712 Unilateral primary osteoarthritis, left knee: Secondary | ICD-10-CM | POA: Diagnosis present

## 2023-09-16 DIAGNOSIS — G894 Chronic pain syndrome: Secondary | ICD-10-CM | POA: Diagnosis not present

## 2023-09-16 DIAGNOSIS — M5416 Radiculopathy, lumbar region: Secondary | ICD-10-CM | POA: Diagnosis present

## 2023-09-16 DIAGNOSIS — M7918 Myalgia, other site: Secondary | ICD-10-CM | POA: Insufficient documentation

## 2023-09-16 DIAGNOSIS — Z79891 Long term (current) use of opiate analgesic: Secondary | ICD-10-CM

## 2023-09-16 MED ORDER — HYDROCODONE-ACETAMINOPHEN 7.5-325 MG PO TABS: 1.0000 | ORAL_TABLET | Freq: Three times a day (TID) | ORAL | 0 refills | Status: DC | PRN

## 2023-09-16 MED ORDER — HYDROCODONE-ACETAMINOPHEN 7.5-325 MG PO TABS: 1.0000 | ORAL_TABLET | Freq: Three times a day (TID) | ORAL | 0 refills | Status: AC | PRN

## 2023-09-16 NOTE — Progress Notes (Signed)
 Nursing Pain Medication Assessment:  Safety precautions to be maintained throughout the outpatient stay will include: orient to surroundings, keep bed in low position, maintain call bell within reach at all times, provide assistance with transfer out of bed and ambulation.  Medication Inspection Compliance: Pill count conducted under aseptic conditions, in front of the patient. Neither the pills nor the bottle was removed from the patient's sight at any time. Once count was completed pills were immediately returned to the patient in their original bottle.  Medication: Hydrocodone /APAP Pill/Patch Count: 25 of 90 pills/patches remain Pill/Patch Appearance: Markings consistent with prescribed medication Bottle Appearance: Standard pharmacy container. Clearly labeled. Filled Date: 05 / 29 / 2025 Last Medication intake:  TodaySafety precautions to be maintained throughout the outpatient stay will include: orient to surroundings, keep bed in low position, maintain call bell within reach at all times, provide assistance with transfer out of bed and ambulation.

## 2023-09-16 NOTE — Progress Notes (Signed)
 PROVIDER NOTE: Interpretation of information contained herein should be left to medically-trained personnel. Specific patient instructions are provided elsewhere under Patient Instructions section of medical record. This document was created in part using AI and STT-dictation technology, any transcriptional errors that may result from this process are unintentional.  Patient: [name]  Service: E/M   PCP: Clora Marlee RODES, MD  DOB: 01-04-1961  DOS: 09/16/2023  Provider: Wallie Sherry, MD  MRN: [id number]  Delivery: Face-to-face  Specialty: Interventional Pain Management  Type: Established Patient  Setting: Ambulatory outpatient facility  Specialty designation: 09  Referring Prov.: Clora Marlee RODES, MD  Location: Outpatient office facility       History of present illness (HPI) [name], a 63 y.o. year old female, is here today because of her No primary diagnosis found.. [name]'s primary complain today is Back Pain (lower)  Pertinent problems: [name] has Lumbar radiculopathy; Chronic bilateral low back pain with bilateral sciatica; Chronic pain syndrome; Chronic, continuous use of opioids; Chronic myofascial pain; Primary osteoarthritis of both hips; Type 2 diabetes mellitus without complications (HCC); and Pain in both hands on their pertinent problem list.  Pain Assessment: Severity of Chronic pain is reported as a 9 /10. Location: Back Lower/both legs to the feet. Onset: More than a month ago. Quality: Other (Comment) (stiff). Timing: Constant. Modifying factor(s): Hydrocodone , LESI. Vitals:  height is 4' 11 (1.499 m) and weight is 90 lb (40.8 kg). Her temporal temperature is 97.3 F (36.3 C) (abnormal). Her blood pressure is 117/74 and her pulse is 98. Her respiration is 16 and oxygen saturation is 99%.  BMI: Estimated body mass index is 18.18 kg/m as calculated from the following:   Height as of this encounter: 4' 11 (1.499 m).   Weight as of this  encounter: 90 lb (40.8 kg).  Last encounter: 06/10/2023. Last procedure: 07/09/2023.  Reason for encounter: both, medication management and post-procedure evaluation and assessment.   Post-Procedure Evaluation   Type: Lumbar epidural steroid injection (LESI) (interlaminar) #1  & Lumbar TPI Laterality: Right   Level:  L4-5 Level.  Imaging: Fluoroscopic guidance         Anesthesia: Local anesthesia (1-2% Lidocaine ) Sedation: Minimal Sedation                       DOS: 07/09/2023  Performed by: Wallie Sherry, MD  Purpose: Diagnostic/Therapeutic Indications: Lumbar radicular pain of intraspinal etiology of more than 4 weeks that has failed to respond to conservative therapy and is severe enough to impact quality of life or function. 1. Lumbar radiculopathy   2. Chronic pain syndrome    NAS-11 Pain score:   Pre-procedure: 8 /10   Post-procedure: 8 /10     Effectiveness:  Initial hour after procedure: 20 % Subsequent 4-6 hours post-procedure: 20 %  Analgesia past initial 6 hours: 80 %  Ongoing improvement:  Analgesic:  80% Function: [name] reports improvement in function  History of Present Illness   [name] is a 63 year old female who presents for follow-up regarding pain management and medication refills.  She experiences approximately 80% pain relief from a recent injection. She is currently using hydrocodone  for pain management, with the next refill scheduled for September 22, 2023.  She occasionally experiences nausea, which she describes as 'weird'. To manage this, she sometimes smokes THC, though this is not a daily habit and she has never had a positive urine test for THC.  She  inquires about treatment options for arthritis pain.       Pharmacotherapy Assessment   Analgesic:Norco 7.5 mg TID PRN #90/month  Monitoring: [place] PMP: PDMP reviewed during this encounter.       Pharmacotherapy: No side-effects or adverse reactions reported. Compliance: No  problems identified. Effectiveness: Clinically acceptable.  Shela Reda CROME, RN  09/16/2023  8:20 AM  Sign when Signing Visit Nursing Pain Medication Assessment:  Safety precautions to be maintained throughout the outpatient stay will include: orient to surroundings, keep bed in low position, maintain call bell within reach at all times, provide assistance with transfer out of bed and ambulation.  Medication Inspection Compliance: Pill count conducted under aseptic conditions, in front of the patient. Neither the pills nor the bottle was removed from the patient's sight at any time. Once count was completed pills were immediately returned to the patient in their original bottle.  Medication: Hydrocodone /APAP Pill/Patch Count: 25 of 90 pills/patches remain Pill/Patch Appearance: Markings consistent with prescribed medication Bottle Appearance: Standard pharmacy container. Clearly labeled. Filled Date: 05 / 29 / 2025 Last Medication intake:  TodaySafety precautions to be maintained throughout the outpatient stay will include: orient to surroundings, keep bed in low position, maintain call bell within reach at all times, provide assistance with transfer out of bed and ambulation.     UDS:  Summary  Date Value Ref Range Status  12/17/2022 Note  Final    Comment:    ==================================================================== [facility] (MW) ==================================================================== Test                             Result       Flag       Units  Drug Present and Declared for Prescription Verification   Hydrocodone                     1238         EXPECTED   ng/mg creat   Hydromorphone                  328          EXPECTED   ng/mg creat   Dihydrocodeine                 349          EXPECTED   ng/mg creat   Norhydrocodone                 2331         EXPECTED   ng/mg creat    Sources of hydrocodone  include scheduled prescription medications.     Hydromorphone, dihydrocodeine and norhydrocodone are expected    metabolites of hydrocodone . Hydromorphone and dihydrocodeine are    also available as scheduled prescription medications.  ==================================================================== Test                      Result    Flag   Units      Ref Range   Creatinine              39               mg/dL      >=79 ==================================================================== Declared Medications:  The flagging and interpretation on this report are based on the  following declared medications.  Unexpected results may arise from  inaccuracies in the declared medications.   **Note: The testing scope of  this panel includes these medications:   Hydrocodone  (Norco)   **Note: The testing scope of this panel does not include the  following reported medications:   Acetaminophen  (Norco)  Albuterol  (Ventolin  HFA)  Aripiprazole  (Abilify )  Aspirin   Budesonide (Symbicort)  Cetirizine (Zyrtec)  Cysteamine (Procysbi)  Docusate (Colace)  Dulaglutide (Trulicity)  Famotidine  (Pepcid )  Fluticasone  (Flonase )  Formoterol  (Symbicort)  Glipizide  (Glucotrol )  Insulin  (NovoLog )  Linaclotide  (Linzess )  Losartan  (Cozaar )  Magnesium  (Mag-Ox)  Meclizine (Antivert)  Metformin (Glucophage)  Mirtazapine  (Remeron )  Montelukast  (Singulair )  Omeprazole (Prilosec)  Ondansetron  (Zofran )  Pregabalin  (Lyrica )  Propranolol  (Inderal )  Rizatriptan (Maxalt)  Rosuvastatin  (Crestor )  Solifenacin (Vesicare)  Topical Lidocaine  (Lidoderm )  Topiramate  (Topamax )  Trazodone  (Desyrel )  Venlafaxine  (Effexor ) ==================================================================== For clinical consultation, please call [phone]. ====================================================================     No results found for: CBDTHCR No results found for: D8THCCBX No results found for: D9THCCBX  ROS  Constitutional: Denies any fever  or chills Gastrointestinal: No reported hemesis, hematochezia, vomiting, or acute GI distress Musculoskeletal: Denies any acute onset joint swelling, redness, loss of ROM, or weakness Neurological: No reported episodes of acute onset apraxia, aphasia, dysarthria, agnosia, amnesia, paralysis, loss of coordination, or loss of consciousness  Medication Review  Accu-Chek Guide, HYDROcodone -acetaminophen , albuterol , aspirin , cetirizine, docusate sodium, famotidine , glucose blood, lidocaine , linaclotide , meclizine, metFORMIN, mirtazapine , omeprazole, ondansetron , pregabalin , propranolol , rizatriptan, rosuvastatin , topiramate , traZODone , and venlafaxine  XR  History Review  Allergy: [name] is allergic to tramadol, benadryl [diphenhydramine hcl], bextra [valdecoxib], invokana [canagliflozin], and sulfamethoxazole-trimethoprim. Drug: [name]  reports that she does not currently use drugs. Alcohol:  reports current alcohol use of about 2.0 standard drinks of alcohol per week. Tobacco:  reports that she has been smoking cigarettes. She has a 36 pack-year smoking history. She has been exposed to tobacco smoke. She has quit using smokeless tobacco. Social: [name]  reports that she has been smoking cigarettes. She has a 36 pack-year smoking history. She has been exposed to tobacco smoke. She has quit using smokeless tobacco. She reports current alcohol use of about 2.0 standard drinks of alcohol per week. She reports that she does not currently use drugs. Medical:  has a past medical history of Abnormal CBC, Asthma, Asthma, BPPV (benign paroxysmal positional vertigo), Cardiac murmur, unspecified (08/28/2016), COPD (chronic obstructive pulmonary disease) (HCC), CPAP (continuous positive airway pressure) dependence, Degenerative disc disease, lumbar, Depression, Diabetes mellitus, Diverticulitis (10/2016), Elevated LFTs, Fatty liver, Fibromyalgia, Fibromyalgia, GERD (gastroesophageal reflux  disease), Headache, History of degenerative disc disease, Hyperlipidemia, Hypertension, and Sleep apnea. Surgical: [name]  has a past surgical history that includes Abdominal surgery; Abdominal hysterectomy; Esophagogastroduodenoscopy (egd) with propofol  (N/A, 02/20/2017); Colonoscopy with propofol  (N/A, 02/20/2017); Upper esophageal endoscopic ultrasound (eus) (N/A, 04/03/2017); Diverticulitis; EUS (N/A, 01/08/2018); Cholecystectomy (N/A, 04/27/2018); Cataract extraction w/PHACO (Left, 09/07/2019); Cataract extraction w/PHACO (Right, 09/28/2019); and RIGHT/LEFT HEART CATH AND CORONARY ANGIOGRAPHY (Bilateral, 12/19/2021). Family: family history includes Diabetes in her mother; Hypertension in her mother.  Laboratory Chemistry Profile   Renal Lab Results  Component Value Date   BUN 20 04/19/2022   CREATININE 0.52 04/19/2022   BCR 14 11/27/2017   GFRAA >60 08/09/2019   GFRNONAA >60 04/19/2022    Hepatic Lab Results  Component Value Date   AST 10 (L) 04/19/2022   ALT 36 04/19/2022   ALBUMIN 3.9 04/19/2022   ALKPHOS 138 (H) 04/19/2022   LIPASE 47 04/19/2022   AMMONIA 46 (H) 04/05/2021    Electrolytes Lab Results  Component Value Date   NA 138 04/19/2022   K  3.7 04/19/2022   CL 107 04/19/2022   CALCIUM  9.8 04/19/2022   MG 1.4 (L) 04/19/2022    Bone No results found for: VD25OH, VD125OH2TOT, CI6874NY7, CI7874NY7, 25OHVITD1, 25OHVITD2, 25OHVITD3, TESTOFREE, TESTOSTERONE  Inflammation (CRP: Acute Phase) (ESR: Chronic Phase) Lab Results  Component Value Date   CRP <1 11/27/2017   ESRSEDRATE 17 11/27/2017   LATICACIDVEN 1.3 04/05/2021         Note: Above Lab results reviewed.  Recent Imaging Review  DG PAIN CLINIC C-ARM 1-60 MIN NO REPORT Fluoro was used, but no Radiologist interpretation will be provided.  Please refer to NOTES tab for provider progress note. Note: Reviewed        Physical Exam  General appearance: Well nourished, well developed, and  well hydrated. In no apparent acute distress Mental status: Alert, oriented x 3 (person, place, & time)       Respiratory: No evidence of acute respiratory distress Eyes: PERLA Vitals: BP 117/74 (Cuff Size: Normal)   Pulse 98   Temp (!) 97.3 F (36.3 C) (Temporal)   Resp 16   Ht 4' 11 (1.499 m)   Wt 90 lb (40.8 kg)   LMP  (LMP Unknown) Comment: many years  SpO2 99%   BMI 18.18 kg/m  BMI: Estimated body mass index is 18.18 kg/m as calculated from the following:   Height as of this encounter: 4' 11 (1.499 m).   Weight as of this encounter: 90 lb (40.8 kg). Ideal: Female patients must weigh at least 45.5 kg to calculate ideal body weight  Assessment   Diagnosis Status  1. Lumbar radiculopathy   2. Chronic pain syndrome   3. Chronic myofascial pain   4. Arthritis of left knee   5. Primary osteoarthritis of right knee   6. Chronic, continuous use of opioids    Controlled Controlled Controlled     Plan of Care  Problem-specific:  Assessment and Plan    Arthritis   Chronic arthritic pain is present. Turmeric, known for its anti-inflammatory properties, was discussed as an alternative treatment. Studies support its effectiveness comparable to anti-inflammatory medications. Recommend turmeric for pain management.  Nausea   Intermittent nausea is managed with occasional THC use. She was advised on THC's impact on medication management, particularly concerning her hydrocodone  prescription. Honest communication about THC use is crucial to avoid complications with urine drug screening. Continue current management if THC use remains infrequent. Discuss alternative treatments for nausea if needed. Ensure urine drug screening remains negative for THC to continue hydrocodone  prescription.      Repeat L-ESI PRN Refill Hydrocodone  Continue Lyrica  as prescribed, no refills needed UDS UTD- get UDS at next visit with Seema  [name] has a current medication list  which includes the following long-term medication(s): albuterol , famotidine , metformin, mirtazapine , omeprazole, pregabalin , propranolol , rizatriptan, topiramate , trazodone , and venlafaxine  xr.  Pharmacotherapy (Medications Ordered): Meds ordered this encounter  Medications   HYDROcodone -acetaminophen  (NORCO) 7.5-325 MG tablet    Sig: Take 1 tablet by mouth 3 (three) times daily as needed for severe pain (pain score 7-10). Must last 30 days    Dispense:  90 tablet    Refill:  0    Chronic Pain: STOP Act (Not applicable) Fill 1 day early if closed on refill date. Avoid benzodiazepines within 8 hours of opioids   HYDROcodone -acetaminophen  (NORCO) 7.5-325 MG tablet    Sig: Take 1 tablet by mouth 3 (three) times daily as needed for severe pain (pain score 7-10). Must last 30  days    Dispense:  90 tablet    Refill:  0    Chronic Pain: STOP Act (Not applicable) Fill 1 day early if closed on refill date. Avoid benzodiazepines within 8 hours of opioids   HYDROcodone -acetaminophen  (NORCO) 7.5-325 MG tablet    Sig: Take 1 tablet by mouth 3 (three) times daily as needed for severe pain (pain score 7-10). Must last 30 days    Dispense:  90 tablet    Refill:  0    Chronic Pain: STOP Act (Not applicable) Fill 1 day early if closed on refill date. Avoid benzodiazepines within 8 hours of opioids   Orders:  Orders Placed This Encounter  Procedures   Lumbar Epidural Injection    Standing Status:   Standing    Number of Occurrences:   3    Next Expected Occurrence:   12/05/2023    Expiration Date:   09/15/2024    Scheduling Instructions:     Procedure: Interlaminar Lumbar Epidural Steroid injection (LESI)            Laterality: Midline     Sedation: Patient's choice.     Timeframe: PRN    Where will this procedure be performed?:   [facility]    Return in about 3 months (around 12/17/2023) for Emmy Blanch, MM.    Recent Visits Date Type Provider Dept  07/09/23 Procedure visit Marcelino Nurse, MD [facility]  Showing recent visits within past 90 days and meeting all other requirements Today's Visits Date Type Provider Dept  09/16/23 Office Visit Marcelino Nurse, MD [facility]  Showing today's visits and meeting all other requirements Future Appointments Date Type Provider Dept  10/08/23 Appointment Marcelino Nurse, MD [facility]  12/11/23 Appointment Marcelino Nurse, MD [facility]  Showing future appointments within next 90 days and meeting all other requirements  I discussed the assessment and treatment plan with the patient. The patient was provided an opportunity to ask questions and all were answered. The patient agreed with the plan and demonstrated an understanding of the instructions.  Patient advised to call back or seek an in-person evaluation if the symptoms or condition worsens.  Duration of encounter: .  Total time on encounter, as per AMA guidelines included both the face-to-face and non-face-to-face time personally spent by the physician and/or other qualified health care professional(s) on the day of the encounter (includes time in activities that require the physician or other qualified health care professional and does not include time in activities normally performed by clinical staff). Physician's time may include the following activities when performed: Preparing to see the patient (e.g., pre-charting review of records, searching for previously ordered imaging, lab work, and nerve conduction tests) Review of prior analgesic pharmacotherapies. Reviewing PMP Interpreting ordered tests (e.g., lab work, imaging, nerve conduction tests) Performing post-procedure evaluations, including interpretation of diagnostic procedures Obtaining and/or reviewing separately obtained history Performing a medically appropriate examination and/or evaluation Counseling and educating the patient/family/caregiver Ordering  medications, tests, or procedures Referring and communicating with other health care professionals (when not separately reported) Documenting clinical information in the electronic or other health record Independently interpreting results (not separately reported) and communicating results to the patient/ family/caregiver Care coordination (not separately reported)  Note by: Nurse Marcelino, MD (TTS and AI technology used. I apologize for any typographical errors that were not detected and corrected.) Date: 09/16/2023; Time: 9:15 AM

## 2023-09-16 NOTE — Patient Instructions (Addendum)
 Try Turmeric for your arthritic pain   Procedure instructions  Do not eat or drink fluids (other than water) for 6 hours before your procedure  No water for 2 hours before your procedure  Take your blood pressure medicine with a sip of water  Arrive 30 minutes before your appointment  Carefully read the Preparing for your procedure detailed instructions  If you have questions call us  at [phone]  _____________________________________________________________________    ______________________________________________________________________  Preparing for your procedure  Appointments: If you think you may not be able to keep your appointment, call 24-48 hours in advance to cancel. We need time to make it available to others.  During your procedure appointment there will be: No Prescription Refills. No disability issues to discussed. No medication changes or discussions.  Instructions: Food intake: Avoid eating anything solid for at least 8 hours prior to your procedure. Clear liquid intake: You may take clear liquids such as water up to 2 hours prior to your procedure. (No carbonated drinks. No soda.) Transportation: Unless otherwise stated by your physician, bring a driver. Morning Medicines: Except for blood thinners, take all of your other morning medications with a sip of water. Make sure to take your heart and blood pressure medicines. If your blood pressure's lower number is above 100, the case will be rescheduled. Blood thinners: Make sure to stop your blood thinners as instructed.  If you take a blood thinner, but were not instructed to stop it, call our office [phone] and ask to talk to a nurse. Not stopping a blood thinner prior to certain procedures could lead to serious complications. Diabetics on insulin : Notify the staff so that you can be scheduled 1st case in the morning. If your diabetes requires high dose insulin , take only  of your normal insulin   dose the morning of the procedure and notify the staff that you have done so. Preventing infections: Shower with an antibacterial soap the morning of your procedure.  Build-up your immune system: Take 1000 mg of Vitamin C with every meal (3 times a day) the day prior to your procedure. Antibiotics: Inform the nursing staff if you are taking any antibiotics or if you have any conditions that may require antibiotics prior to procedures. (Example: recent joint implants)   Pregnancy: If you are pregnant make sure to notify the nursing staff. Not doing so may result in injury to the fetus, including death.  Sickness: If you have a cold, fever, or any active infections, call and cancel or reschedule your procedure. Receiving steroids while having an infection may result in complications. Arrival: You must be in the facility at least 30 minutes prior to your scheduled procedure. Tardiness: Your scheduled time is also the cutoff time. If you do not arrive at least 15 minutes prior to your procedure, you will be rescheduled.  Children: Do not bring any children with you. Make arrangements to keep them home. Dress appropriately: There is always a possibility that your clothing may get soiled. Avoid long dresses. Valuables: Do not bring any jewelry or valuables.  Reasons to call and reschedule or cancel your procedure: (Following these recommendations will minimize the risk of a serious complication.) Surgeries: Avoid having procedures within 2 weeks of any surgery. (Avoid for 2 weeks before or after any surgery). Flu Shots: Avoid having procedures within 2 weeks of a flu shots or . (Avoid for 2 weeks before or after immunizations). Barium: Avoid having a procedure within 7-10 days after having had a  radiological study involving the use of radiological contrast. (Myelograms, Barium swallow or enema study). Heart attacks: Avoid any elective procedures or surgeries for the initial 6 months after a Myocardial  Infarction (Heart Attack). Blood thinners: It is imperative that you stop these medications before procedures. Let us  know if you if you take any blood thinner.  Infection: Avoid procedures during or within two weeks of an infection (including chest colds or gastrointestinal problems). Symptoms associated with infections include: Localized redness, fever, chills, night sweats or profuse sweating, burning sensation when voiding, cough, congestion, stuffiness, runny nose, sore throat, diarrhea, nausea, vomiting, cold or Flu symptoms, recent or current infections. It is specially important if the infection is over the area that we intend to treat. Heart and lung problems: Symptoms that may suggest an active cardiopulmonary problem include: cough, chest pain, breathing difficulties or shortness of breath, dizziness, ankle swelling, uncontrolled high or unusually low blood pressure, and/or palpitations. If you are experiencing any of these symptoms, cancel your procedure and contact your primary care physician for an evaluation.  Remember:  Regular Business hours are:  Monday to Thursday 8:00 AM to 4:00 PM  Provider's Schedule: Eric Como, MD:  Procedure days: Tuesday and Thursday 7:30 AM to 4:00 PM  Wallie Sherry, MD:  Procedure days: Monday and Wednesday 7:30 AM to 4:00 PM

## 2023-10-08 ENCOUNTER — Ambulatory Visit: Payer: MEDICAID | Admitting: Student in an Organized Health Care Education/Training Program

## 2023-10-22 ENCOUNTER — Ambulatory Visit (HOSPITAL_BASED_OUTPATIENT_CLINIC_OR_DEPARTMENT_OTHER): Payer: MEDICAID | Admitting: Student in an Organized Health Care Education/Training Program

## 2023-10-22 ENCOUNTER — Encounter: Payer: Self-pay | Admitting: Student in an Organized Health Care Education/Training Program

## 2023-10-22 ENCOUNTER — Ambulatory Visit
Admission: RE | Admit: 2023-10-22 | Discharge: 2023-10-22 | Disposition: A | Discharge status: Home / Self Care | Payer: MEDICAID | Source: Ambulatory Visit | Attending: Student in an Organized Health Care Education/Training Program | Admitting: Student in an Organized Health Care Education/Training Program

## 2023-10-22 VITALS — BP 145/91 | HR 85 | Temp 97.7°F | Resp 18 | Ht 59.0 in | Wt 90.0 lb

## 2023-10-22 DIAGNOSIS — M5416 Radiculopathy, lumbar region: Secondary | ICD-10-CM | POA: Diagnosis present

## 2023-10-22 DIAGNOSIS — G894 Chronic pain syndrome: Secondary | ICD-10-CM | POA: Diagnosis not present

## 2023-10-22 MED ORDER — LIDOCAINE HCL 2 % IJ SOLN
20.0000 mL | Freq: Once | INTRAMUSCULAR | Status: AC
  Administered 2023-10-22: 200 mg

## 2023-10-22 MED ORDER — SODIUM CHLORIDE 0.9% FLUSH
2.0000 mL | Freq: Once | INTRAVENOUS | Status: AC
  Administered 2023-10-22: 2 mL

## 2023-10-22 MED ORDER — DIAZEPAM 5 MG PO TABS
ORAL_TABLET | ORAL | Status: AC
  Filled 2023-10-22: qty 1

## 2023-10-22 MED ORDER — LIDOCAINE HCL (PF) 2 % IJ SOLN
INTRAMUSCULAR | Status: AC
  Filled 2023-10-22: qty 10

## 2023-10-22 MED ORDER — SODIUM CHLORIDE (PF) 0.9 % IJ SOLN
INTRAMUSCULAR | Status: AC
  Filled 2023-10-22: qty 10

## 2023-10-22 MED ORDER — ROPIVACAINE HCL 2 MG/ML IJ SOLN
INTRAMUSCULAR | Status: AC
  Filled 2023-10-22: qty 20

## 2023-10-22 MED ORDER — DEXAMETHASONE SODIUM PHOSPHATE 10 MG/ML IJ SOLN
10.0000 mg | Freq: Once | INTRAMUSCULAR | Status: AC
  Administered 2023-10-22: 10 mg

## 2023-10-22 MED ORDER — IOHEXOL 180 MG/ML  SOLN
10.0000 mL | Freq: Once | INTRAMUSCULAR | Status: AC
  Administered 2023-10-22: 10 mL via EPIDURAL

## 2023-10-22 MED ORDER — DEXAMETHASONE SODIUM PHOSPHATE 10 MG/ML IJ SOLN
INTRAMUSCULAR | Status: AC
  Filled 2023-10-22: qty 1

## 2023-10-22 MED ORDER — IOHEXOL 180 MG/ML  SOLN
INTRAMUSCULAR | Status: AC
  Filled 2023-10-22: qty 20

## 2023-10-22 MED ORDER — ROPIVACAINE HCL 2 MG/ML IJ SOLN
2.0000 mL | Freq: Once | INTRAMUSCULAR | Status: AC
  Administered 2023-10-22: 2 mL via EPIDURAL

## 2023-10-22 NOTE — Progress Notes (Signed)
 PROVIDER NOTE: Interpretation of information contained herein should be left to medically-trained personnel. Specific patient instructions are provided elsewhere under Patient Instructions section of medical record. This document was created in part using STT-dictation technology, any transcriptional errors that may result from this process are unintentional.  Patient: [name] Type: Established DOB: 10-Feb-1961 MRN: [id number] PCP: Clora Marlee RODES, MD  Service: Procedure DOS: 10/22/2023 Setting: Ambulatory Location: Ambulatory outpatient facility Delivery: Face-to-face Provider: Wallie Sherry, MD Specialty: Interventional Pain Management Specialty designation: 09 Location: Outpatient facility Ref. Prov.: Clora Marlee RODES, MD       Interventional Therapy   Type: Lumbar epidural steroid injection (LESI) (interlaminar) Laterality: Right   Level:  L4-5 Level.  Imaging: Fluoroscopic guidance         Anesthesia: Local anesthesia (1-2% Lidocaine ) Sedation: PO Valium  DOS: 10/22/2023  Performed by: Wallie Sherry, MD  Purpose: Diagnostic/Therapeutic Indications: Lumbar radicular pain of intraspinal etiology of more than 4 weeks that has failed to respond to conservative therapy and is severe enough to impact quality of life or function. 1. Lumbar radiculopathy   2. Chronic pain syndrome    NAS-11 Pain score:   Pre-procedure: 8 /10   Post-procedure: 6 /10      Position / Prep / Materials:  Position: Prone w/ head of the table raised (slight reverse trendelenburg) to facilitate breathing.  Prep solution: ChloraPrep (2% chlorhexidine  gluconate and 70% isopropyl alcohol) Prep Area: Entire Posterior Lumbar Region from lower scapular tip down to mid buttocks area and from flank to flank. Materials:  Tray: Epidural tray Needle(s):  Type: Epidural needle (Tuohy) Gauge (G):  22 Length: Regular (3.5-in) Qty: 1  H&P (Pre-op Assessment):  [name] is a 63 y.o. (year old),  female patient, seen today for interventional treatment. She  has a past surgical history that includes Abdominal surgery; Abdominal hysterectomy; Esophagogastroduodenoscopy (egd) with propofol  (N/A, 02/20/2017); Colonoscopy with propofol  (N/A, 02/20/2017); Upper esophageal endoscopic ultrasound (eus) (N/A, 04/03/2017); Diverticulitis; EUS (N/A, 01/08/2018); Cholecystectomy (N/A, 04/27/2018); Cataract extraction w/PHACO (Left, 09/07/2019); Cataract extraction w/PHACO (Right, 09/28/2019); and RIGHT/LEFT HEART CATH AND CORONARY ANGIOGRAPHY (Bilateral, 12/19/2021). [name] has a current medication list which includes the following prescription(s): accu-chek guide, albuterol , aspirin , accu-chek guide, cetirizine, docusate sodium, famotidine , hydrocodone -acetaminophen , hydrocodone -acetaminophen , [START ON 11/21/2023] hydrocodone -acetaminophen , lidocaine , linzess , meclizine, metformin, mirtazapine , omeprazole, ondansetron , pregabalin , propranolol , rizatriptan, rosuvastatin , topiramate , trazodone , and venlafaxine  xr. Her primarily concern today is the Back Pain and Leg Pain  Initial Vital Signs:  Pulse/HCG Rate: 85ECG Heart Rate: 89 Temp: 97.7 F (36.5 C) Resp: 16 BP: 124/88 SpO2: 97 %  BMI: Estimated body mass index is 18.18 kg/m as calculated from the following:   Height as of this encounter: 4' 11 (1.499 m).   Weight as of this encounter: 90 lb (40.8 kg).  Risk Assessment: Allergies: Reviewed. She is allergic to tramadol, benadryl [diphenhydramine hcl], bextra [valdecoxib], invokana [canagliflozin], and sulfamethoxazole-trimethoprim.  Allergy Precautions: None required Coagulopathies: Reviewed. None identified.  Blood-thinner therapy: None at this time Active Infection(s): Reviewed. None identified. [name] is afebrile  Site Confirmation: [name] was asked to confirm the procedure and laterality before marking the site Procedure checklist: Completed Consent: Before the procedure  and under the influence of no sedative(s), amnesic(s), or anxiolytics, the patient was informed of the treatment options, risks and possible complications. To fulfill our ethical and legal obligations, as recommended by the American Medical Association's Code of Ethics, I have informed the patient of my clinical impression; the nature and purpose of the treatment or  procedure; the risks, benefits, and possible complications of the intervention; the alternatives, including doing nothing; the risk(s) and benefit(s) of the alternative treatment(s) or procedure(s); and the risk(s) and benefit(s) of doing nothing. The patient was provided information about the general risks and possible complications associated with the procedure. These may include, but are not limited to: failure to achieve desired goals, infection, bleeding, organ or nerve damage, allergic reactions, paralysis, and death. In addition, the patient was informed of those risks and complications associated to Spine-related procedures, such as failure to decrease pain; infection (i.e.: Meningitis, epidural or intraspinal abscess); bleeding (i.e.: epidural hematoma, subarachnoid hemorrhage, or any other type of intraspinal or peri-dural bleeding); organ or nerve damage (i.e.: Any type of peripheral nerve, nerve root, or spinal cord injury) with subsequent damage to sensory, motor, and/or autonomic systems, resulting in permanent pain, numbness, and/or weakness of one or several areas of the body; allergic reactions; (i.e.: anaphylactic reaction); and/or death. Furthermore, the patient was informed of those risks and complications associated with the medications. These include, but are not limited to: allergic reactions (i.e.: anaphylactic or anaphylactoid reaction(s)); adrenal axis suppression; blood sugar elevation that in diabetics may result in ketoacidosis or comma; water retention that in patients with history of congestive heart failure may result  in shortness of breath, pulmonary edema, and decompensation with resultant heart failure; weight gain; swelling or edema; medication-induced neural toxicity; particulate matter embolism and blood vessel occlusion with resultant organ, and/or nervous system infarction; and/or aseptic necrosis of one or more joints. Finally, the patient was informed that Medicine is not an exact science; therefore, there is also the possibility of unforeseen or unpredictable risks and/or possible complications that may result in a catastrophic outcome. The patient indicated having understood very clearly. We have given the patient no guarantees and we have made no promises. Enough time was given to the patient to ask questions, all of which were answered to the patient's satisfaction. [name] has indicated that she wanted to continue with the procedure. Attestation: I, the ordering provider, attest that I have discussed with the patient the benefits, risks, side-effects, alternatives, likelihood of achieving goals, and potential problems during recovery for the procedure that I have provided informed consent. Date  Time: 10/22/2023 12:41 PM  Pre-Procedure Preparation:  Monitoring: As per clinic protocol. Respiration, ETCO2, SpO2, BP, heart rate and rhythm monitor placed and checked for adequate function Safety Precautions: Patient was assessed for positional comfort and pressure points before starting the procedure. Time-out: I initiated and conducted the Time-out before starting the procedure, as per protocol. The patient was asked to participate by confirming the accuracy of the Time Out information. Verification of the correct person, site, and procedure were performed and confirmed by me, the nursing staff, and the patient. Time-out conducted as per Joint Commission's Universal Protocol (UP.01.01.01). Time: 1318 Start Time: 1318 hrs.  Description/Narrative of Procedure:          Target: Epidural space  via interlaminar opening, initially targeting the lower laminar border of the superior vertebral body. Region: Lumbar Approach: Percutaneous paravertebral  Rationale (medical necessity): procedure needed and proper for the diagnosis and/or treatment of the patient's medical symptoms and needs. Procedural Technique Safety Precautions: Aspiration looking for blood return was conducted prior to all injections. At no point did we inject any substances, as a needle was being advanced. No attempts were made at seeking any paresthesias. Safe injection practices and needle disposal techniques used. Medications properly checked for expiration dates. SDV (single  dose vial) medications used. Description of the Procedure: Protocol guidelines were followed. The procedure needle was introduced through the skin, ipsilateral to the reported pain, and advanced to the target area. Bone was contacted and the needle walked caudad, until the lamina was cleared. The epidural space was identified using "loss-of-resistance technique" with 2-3 ml of PF-NaCl (0.9% NSS), in a 5cc LOR glass syringe.  5cc solution made of 2cc of preservative-free saline, 2 cc of 0.2% ropivacaine , 1 cc of Decadron  10 mg/cc.  Afterwards a lumbar trigger point injection was also done in the lumbosacral area with 1 cc of 0.2% ropivacaine  injected.  Needling was also performed.   Vitals:   10/22/23 1246 10/22/23 1313 10/22/23 1316 10/22/23 1320  BP: 124/88 (!) 148/103 (!) 156/99 (!) 145/91  Pulse: 85     Resp: 16 14 17 18   Temp: 97.7 F (36.5 C)     TempSrc: Temporal     SpO2: 97% 100% 100% 100%  Weight: 90 lb (40.8 kg)     Height: 4' 11 (1.499 m)       Start Time: 1318 hrs. End Time: 1320 hrs.  Imaging Guidance (Spinal):          Type of Imaging Technique: Fluoroscopy Guidance (Spinal) Indication(s): Fluoroscopy guidance for needle placement to enhance accuracy in procedures requiring precise needle localization for targeted delivery  of medication in or near specific anatomical locations not easily accessible without such real-time imaging assistance. Exposure Time: Please see nurses notes. Contrast: Before injecting any contrast, we confirmed that the patient did not have an allergy to iodine, shellfish, or radiological contrast. Once satisfactory needle placement was completed at the desired level, radiological contrast was injected. Contrast injected under live fluoroscopy. No contrast complications. See chart for type and volume of contrast used. Fluoroscopic Guidance: I was personally present during the use of fluoroscopy. Tunnel Vision Technique used to obtain the best possible view of the target area. Parallax error corrected before commencing the procedure. Direction-depth-direction technique used to introduce the needle under continuous pulsed fluoroscopy. Once target was reached, antero-posterior, oblique, and lateral fluoroscopic projection used confirm needle placement in all planes. Images permanently stored in EMR. Interpretation: I personally interpreted the imaging intraoperatively. Adequate needle placement confirmed in multiple planes. Appropriate spread of contrast into desired area was observed. No evidence of afferent or efferent intravascular uptake. No intrathecal or subarachnoid spread observed. Permanent images saved into the patient's record.  Antibiotic Prophylaxis:   Anti-infectives (From admission, onward)    None      Indication(s): None identified  Post-operative Assessment:  Post-procedure Vital Signs:  Pulse/HCG Rate: 8583 Temp: 97.7 F (36.5 C) Resp: 18 BP: (!) 145/91 SpO2: 100 %  EBL: None  Complications: No immediate post-treatment complications observed by team, or reported by patient.  Note: The patient tolerated the entire procedure well. A repeat set of vitals were taken after the procedure and the patient was kept under observation following institutional policy, for this  type of procedure. Post-procedural neurological assessment was performed, showing return to baseline, prior to discharge. The patient was provided with post-procedure discharge instructions, including a section on how to identify potential problems. Should any problems arise concerning this procedure, the patient was given instructions to immediately contact us , at any time, without hesitation. In any case, we plan to contact the patient by telephone for a follow-up status report regarding this interventional procedure.  Comments:  No additional relevant information.  Plan of Care (POC)  Orders:  Orders Placed  This Encounter  Procedures   DG PAIN CLINIC C-ARM 1-60 MIN NO REPORT    Intraoperative interpretation by procedural physician at [facility].    Standing Status:   Standing    Number of Occurrences:   1    Reason for exam::   Assistance in needle guidance and placement for procedures requiring needle placement in or near specific anatomical locations not easily accessible without such assistance.   Chronic Opioid Analgesic:  Norco 7.5 mg TID PRN #90/month   Medications ordered for procedure: Meds ordered this encounter  Medications   iohexol  (OMNIPAQUE ) 180 MG/ML injection 10 mL    Must be Myelogram-compatible. If not available, you may substitute with a water-soluble, non-ionic, hypoallergenic, myelogram-compatible radiological contrast medium.   lidocaine  (XYLOCAINE ) 2 % (with pres) injection 400 mg   ropivacaine  (PF) 2 mg/mL (0.2%) (NAROPIN ) injection 2 mL   sodium chloride  flush (NS) 0.9 % injection 2 mL   dexamethasone  (DECADRON ) injection 10 mg   Medications administered: We administered iohexol , lidocaine , ropivacaine  (PF) 2 mg/mL (0.2%), sodium chloride  flush, and dexamethasone .  See the medical record for exact dosing, route, and time of administration.  Follow-up plan:   Return for Keep sch. appt.       Recent Visits Date Type Provider Dept  09/16/23  Office Visit Marcelino Nurse, MD [facility]  Showing recent visits within past 90 days and meeting all other requirements Today's Visits Date Type Provider Dept  10/22/23 Procedure visit Marcelino Nurse, MD [facility]  Showing today's visits and meeting all other requirements Future Appointments Date Type Provider Dept  12/11/23 Appointment Marcelino Nurse, MD [facility]  Showing future appointments within next 90 days and meeting all other requirements  Disposition: Discharge home  Discharge (Date  Time): 10/22/2023; 1327 hrs.   Primary Care Physician: Clora Marlee RODES, MD Location: [facility] Note by: Nurse Marcelino, MD (TTS technology used. I apologize for any typographical errors that were not detected and corrected.) Date: 10/22/2023; Time: 1:51 PM  Disclaimer:  Medicine is not an Visual merchandiser. The only guarantee in medicine is that nothing is guaranteed. It is important to note that the decision to proceed with this intervention was based on the information collected from the patient. The Data and conclusions were drawn from the patient's questionnaire, the interview, and the physical examination. Because the information was provided in large part by the patient, it cannot be guaranteed that it has not been purposely or unconsciously manipulated. Every effort has been made to obtain as much relevant data as possible for this evaluation. It is important to note that the conclusions that lead to this procedure are derived in large part from the available data. Always take into account that the treatment will also be dependent on availability of resources and existing treatment guidelines, considered by other Pain Management Practitioners as being common knowledge and practice, at the time of the intervention. For Medico-Legal purposes, it is also important to point out that variation in procedural techniques and pharmacological choices are  the acceptable norm. The indications, contraindications, technique, and results of the above procedure should only be interpreted and judged by a Board-Certified Interventional Pain Specialist with extensive familiarity and expertise in the same exact procedure and technique.

## 2023-10-22 NOTE — Patient Instructions (Signed)

## 2023-10-22 NOTE — Progress Notes (Signed)
 Safety precautions to be maintained throughout the outpatient stay will include: orient to surroundings, keep bed in low position, maintain call bell within reach at all times, provide assistance with transfer out of bed and ambulation.

## 2023-10-23 ENCOUNTER — Telehealth: Payer: Self-pay

## 2023-10-23 NOTE — Telephone Encounter (Signed)
Called PP No answer

## 2023-12-11 ENCOUNTER — Other Ambulatory Visit: Payer: Self-pay

## 2023-12-11 ENCOUNTER — Emergency Department
Admission: EM | Admit: 2023-12-11 | Discharge: 2023-12-11 | Disposition: A | Discharge status: Home / Self Care | Payer: MEDICAID | Attending: Emergency Medicine | Admitting: Emergency Medicine

## 2023-12-11 ENCOUNTER — Emergency Department: Payer: MEDICAID

## 2023-12-11 ENCOUNTER — Encounter: Payer: Self-pay | Admitting: Intensive Care

## 2023-12-11 ENCOUNTER — Encounter: Payer: MEDICAID | Admitting: Nurse Practitioner

## 2023-12-11 DIAGNOSIS — N390 Urinary tract infection, site not specified: Secondary | ICD-10-CM | POA: Diagnosis not present

## 2023-12-11 DIAGNOSIS — R42 Dizziness and giddiness: Secondary | ICD-10-CM | POA: Insufficient documentation

## 2023-12-11 DIAGNOSIS — B9689 Other specified bacterial agents as the cause of diseases classified elsewhere: Secondary | ICD-10-CM | POA: Diagnosis not present

## 2023-12-11 DIAGNOSIS — R519 Headache, unspecified: Secondary | ICD-10-CM | POA: Diagnosis not present

## 2023-12-11 DIAGNOSIS — R82998 Other abnormal findings in urine: Secondary | ICD-10-CM | POA: Diagnosis present

## 2023-12-11 DIAGNOSIS — R531 Weakness: Secondary | ICD-10-CM | POA: Insufficient documentation

## 2023-12-11 LAB — URINALYSIS, ROUTINE W REFLEX MICROSCOPIC
Bilirubin Urine: NEGATIVE
Glucose, UA: >=500 mg/dL — AB
Hgb urine dipstick: NEGATIVE
Ketones, ur: NEGATIVE mg/dL
Nitrite: NEGATIVE
Protein, ur: 100 mg/dL — AB
Specific Gravity, Urine: 1.022 (ref 1.005–1.030)
WBC, UA: >50 WBC/hpf (ref 0–5)
pH: 7 (ref 5.0–8.0)

## 2023-12-11 LAB — CBC
HCT: 39.4 % (ref 36.0–46.0)
Hemoglobin: 12.8 g/dL (ref 12.0–15.0)
MCH: 28.6 pg (ref 26.0–34.0)
MCHC: 32.5 g/dL (ref 30.0–36.0)
MCV: 87.9 fL (ref 80.0–100.0)
Platelets: 256 K/uL (ref 150–400)
RBC: 4.48 MIL/uL (ref 3.87–5.11)
RDW: 13.2 % (ref 11.5–15.5)
WBC: 9.9 K/uL (ref 4.0–10.5)
nRBC: 0 % (ref 0.0–0.2)

## 2023-12-11 LAB — COMPREHENSIVE METABOLIC PANEL WITH GFR
ALT: 35 U/L (ref 0–44)
AST: 30 U/L (ref 15–41)
Albumin: 3.6 g/dL (ref 3.5–5.0)
Alkaline Phosphatase: 110 U/L (ref 38–126)
Anion gap: 9 (ref 5–15)
BUN: 8 mg/dL (ref 8–23)
CO2: 26 mmol/L (ref 22–32)
Calcium: 8.9 mg/dL (ref 8.9–10.3)
Chloride: 107 mmol/L (ref 98–111)
Creatinine, Ser: 0.51 mg/dL (ref 0.44–1.00)
GFR, Estimated: >60 mL/min (ref >60)
Glucose, Bld: 193 mg/dL — ABNORMAL HIGH (ref 70–99)
Potassium: 3.4 mmol/L — ABNORMAL LOW (ref 3.5–5.1)
Sodium: 142 mmol/L (ref 135–145)
Total Bilirubin: 0.4 mg/dL (ref 0.0–1.2)
Total Protein: 6.3 g/dL — ABNORMAL LOW (ref 6.5–8.1)

## 2023-12-11 LAB — TROPONIN I (HIGH SENSITIVITY)
Troponin I (High Sensitivity): 6 ng/L (ref <18)
Troponin I (High Sensitivity): 6 ng/L (ref <18)

## 2023-12-11 MED ORDER — SODIUM CHLORIDE 0.9 % IV BOLUS
1000.0000 mL | Freq: Once | INTRAVENOUS | Status: AC
  Administered 2023-12-11: 1000 mL via INTRAVENOUS

## 2023-12-11 MED ORDER — SODIUM CHLORIDE 0.9 % IV SOLN
1.0000 g | Freq: Once | INTRAVENOUS | Status: AC
  Administered 2023-12-11: 1 g via INTRAVENOUS
  Filled 2023-12-11: qty 10

## 2023-12-11 MED ORDER — CEPHALEXIN 500 MG PO CAPS: 500.0000 mg | ORAL_CAPSULE | Freq: Three times a day (TID) | ORAL | 0 refills | Status: DC

## 2023-12-11 MED ORDER — BUTALBITAL-APAP-CAFFEINE 50-325-40 MG PO TABS
1.0000 | ORAL_TABLET | Freq: Once | ORAL | Status: AC
  Administered 2023-12-11: 1 via ORAL
  Filled 2023-12-11: qty 1

## 2023-12-11 NOTE — ED Provider Notes (Signed)
 [facility] Provider Note    Event Date/Time   First MD Initiated Contact with Patient 12/11/23 1618     (approximate)   History   Dizziness   HPI  [name] is a 63 y.o. female who presents to the emergency department today because of not feeling well.  The patient states that she was sent from her primary care doctor's office.  She went there because for the past roughly month she has felt weak, dizzy.  She has had headaches.  She saw a different doctor than she normally does at her primary care office.     Physical Exam   Triage Vital Signs: ED Triage Vitals  Encounter Vitals Group     BP 12/11/23 1447 (!) 160/96     Girls Systolic BP Percentile --      Girls Diastolic BP Percentile --      Boys Systolic BP Percentile --      Boys Diastolic BP Percentile --      Pulse Rate 12/11/23 1447 83     Resp 12/11/23 1447 15     Temp 12/11/23 1447 98.8 F (37.1 C)     Temp Source 12/11/23 1447 Oral     SpO2 12/11/23 1447 94 %     Weight 12/11/23 1448 87 lb (39.5 kg)     Height 12/11/23 1448 4' 11 (1.499 m)     Head Circumference --      Peak Flow --      Pain Score 12/11/23 1448 9     Pain Loc --      Pain Education --      Exclude from Growth Chart --     Most recent vital signs: Vitals:   12/11/23 1447  BP: (!) 160/96  Pulse: 83  Resp: 15  Temp: 98.8 F (37.1 C)  SpO2: 94%   General: Awake, alert, oriented. CV:  Good peripheral perfusion. Regular rate and rhythm. Resp:  Normal effort. Lungs clear. Abd:  No distention.    ED Results / Procedures / Treatments   Labs (all labs ordered are listed, but only abnormal results are displayed) Labs Reviewed  COMPREHENSIVE METABOLIC PANEL WITH GFR - Abnormal; Notable for the following components:      Result Value   Potassium 3.4 (*)    Glucose, Bld 193 (*)    Total Protein 6.3 (*)    All other components within normal limits  URINALYSIS, ROUTINE W REFLEX MICROSCOPIC -  Abnormal; Notable for the following components:   Color, Urine YELLOW (*)    APPearance HAZY (*)    Glucose, UA >=500 (*)    Protein, ur 100 (*)    Leukocytes,Ua SMALL (*)    Bacteria, UA RARE (*)    All other components within normal limits  URINE CULTURE  CBC  TROPONIN I (HIGH SENSITIVITY)  TROPONIN I (HIGH SENSITIVITY)     EKG  I, Guadalupe Eagles, attending physician, personally viewed and interpreted this EKG  EKG Time: 1452 Rate: 81 Rhythm: normal sinus rhythm Axis: left axis deviation Intervals: qtc 397 QRS: narrow, q waves v1 ST changes: no st elevation Impression: abnormal ekg   RADIOLOGY I independently interpreted and visualized the CT head. My interpretation: No ICH Radiology interpretation:  IMPRESSION:  No acute intracranial abnormality.      PROCEDURES:  Critical Care performed: No    MEDICATIONS ORDERED IN ED: Medications - No data to display   IMPRESSION / MDM / ASSESSMENT  AND PLAN / ED COURSE  I reviewed the triage vital signs and the nursing notes.                              Differential diagnosis includes, but is not limited to, anemia, dehydration, infection  Patient's presentation is most consistent with acute presentation with potential threat to life or bodily function.  Patient presented to the emergency department today because of concerns for not feeling well, being weak and dizzy.  Patient's blood work here without concerning anemia or electrolyte abnormality.  UA is however concerning for infection.  Did feel better after fluids here.  Patient will be given dose of antibiotics here.  Will plan on discharging with further antibiotics.      FINAL CLINICAL IMPRESSION(S) / ED DIAGNOSES   Final diagnoses:  Lower urinary tract infectious disease     Note:  This document was prepared using Dragon voice recognition software and may include unintentional dictation errors.    Floy Roberts, MD 12/12/23 [phone]

## 2023-12-11 NOTE — ED Triage Notes (Signed)
 Brought in by [facility] for C/o dizziness, headache, and right arm pain X1 month.   History cocaine abuse and tested positive for same today  EMS vitals: 165/96b/p 98% RA 83HR 191CBG

## 2023-12-13 LAB — URINE CULTURE: Culture: 60000 — AB

## 2023-12-15 ENCOUNTER — Ambulatory Visit
Admission: RE | Admit: 2023-12-15 | Discharge: 2023-12-15 | Disposition: A | Discharge status: Home / Self Care | Payer: MEDICAID | Source: Ambulatory Visit | Attending: Nurse Practitioner | Admitting: Nurse Practitioner

## 2023-12-15 ENCOUNTER — Ambulatory Visit: Payer: MEDICAID | Attending: Nurse Practitioner | Admitting: Nurse Practitioner

## 2023-12-15 ENCOUNTER — Encounter: Payer: Self-pay | Admitting: Nurse Practitioner

## 2023-12-15 VITALS — BP 150/105 | HR 91 | Temp 97.3°F | Resp 18 | Ht 59.0 in | Wt 87.0 lb

## 2023-12-15 DIAGNOSIS — Z7984 Long term (current) use of oral hypoglycemic drugs: Secondary | ICD-10-CM

## 2023-12-15 DIAGNOSIS — G8929 Other chronic pain: Secondary | ICD-10-CM

## 2023-12-15 DIAGNOSIS — M1712 Unilateral primary osteoarthritis, left knee: Secondary | ICD-10-CM | POA: Insufficient documentation

## 2023-12-15 DIAGNOSIS — M5416 Radiculopathy, lumbar region: Secondary | ICD-10-CM | POA: Diagnosis present

## 2023-12-15 DIAGNOSIS — Z79891 Long term (current) use of opiate analgesic: Secondary | ICD-10-CM

## 2023-12-15 DIAGNOSIS — M25551 Pain in right hip: Secondary | ICD-10-CM | POA: Insufficient documentation

## 2023-12-15 DIAGNOSIS — M170 Bilateral primary osteoarthritis of knee: Secondary | ICD-10-CM

## 2023-12-15 DIAGNOSIS — M7918 Myalgia, other site: Secondary | ICD-10-CM | POA: Diagnosis present

## 2023-12-15 DIAGNOSIS — M1711 Unilateral primary osteoarthritis, right knee: Secondary | ICD-10-CM | POA: Insufficient documentation

## 2023-12-15 DIAGNOSIS — F1190 Opioid use, unspecified, uncomplicated: Secondary | ICD-10-CM | POA: Insufficient documentation

## 2023-12-15 DIAGNOSIS — E1140 Type 2 diabetes mellitus with diabetic neuropathy, unspecified: Secondary | ICD-10-CM | POA: Insufficient documentation

## 2023-12-15 DIAGNOSIS — G894 Chronic pain syndrome: Secondary | ICD-10-CM | POA: Diagnosis present

## 2023-12-15 DIAGNOSIS — M25552 Pain in left hip: Secondary | ICD-10-CM | POA: Diagnosis present

## 2023-12-15 MED ORDER — HYDROCODONE-ACETAMINOPHEN 7.5-325 MG PO TABS: 1.0000 | ORAL_TABLET | Freq: Three times a day (TID) | ORAL | 0 refills | Status: DC | PRN

## 2023-12-15 NOTE — Progress Notes (Signed)
 PROVIDER NOTE: Interpretation of information contained herein should be left to medically-trained personnel. Specific patient instructions are provided elsewhere under Patient Instructions section of medical record. This document was created in part using AI and STT-dictation technology, any transcriptional errors that may result from this process are unintentional.  Patient: [name]  Service: E/M   PCP: Clora Marlee RODES, MD  DOB: 24-Nov-1960  DOS: 12/15/2023  Provider: Emmy MARLA Blanch, NP  MRN: [id number]  Delivery: Face-to-face  Specialty: Interventional Pain Management  Type: Established Patient  Setting: Ambulatory outpatient facility  Specialty designation: 09  Referring Prov.: Clora Marlee RODES, MD  Location: Outpatient office facility       History of present illness (HPI) [name], a 63 y.o. year old female, is here today because of her Lumbar radiculopathy [M54.16]. [name]'s primary complain today is Back Pain and Leg Pain (Bilateral , Right worse than left)  Pertinent problems: [name] has Lumbar radiculopathy; Chronic bilateral low back pain with bilateral sciatica; Chronic continuous use of opioids; Chronic myofascial pain; Primary osteoarthritis of both hips; Type 2 diabetes mellitus without complication (HCC); Bilateral hand pain; and Chronic pain syndrome on their pertinent problem list  Pain Assessment: Severity of Chronic pain is reported as a 10-Worst pain ever/10. Location: Back Lower/down bilateral legs, right worse than left. Onset: More than a month ago. Quality: Contraction, Radiating, Stabbing, Shooting, Tingling, Throbbing. Timing: Constant. Modifying factor(s): Medication. Vitals:  height is 4' 11 (1.499 m) and weight is 87 lb (39.5 kg). Her temporal temperature is 97.3 F (36.3 C) (abnormal). Her blood pressure is 150/105 (abnormal) and her pulse is 91. Her respiration is 18 and oxygen saturation is 95%.  BMI: Estimated body mass  index is 17.57 kg/m as calculated from the following:   Height as of this encounter: 4' 11 (1.499 m).   Weight as of this encounter: 87 lb (39.5 kg).  Last encounter: Visit date not found. Last procedure: 10/22/2023  Reason for encounter: both, medication management and post-procedure evaluation and assessment. No change in medical history since last visit.  Patient's pain is at baseline.  Patient continues multimodal pain regimen as prescribed.  States that it provides pain relief and improvement in functional status.   [name] received a diagnostic/therapeutic  Lumbar epidural steroid injection (LESI) (interlaminar) injection on October 22, 2023.  She reports 100% pain relief and functional improvement during local anesthetic phase, followed sustained 80% pain relief and functional improvement since the procedure.   The patient continues struggling with bilateral hip pain, more pronounced on the right side.  We discussed ordering hip x-rays for further evaluation and will consider interventional treatment based on the imaging results.  Procedure Type: Lumbar epidural steroid injection (LESI) (interlaminar) Laterality: Right   Level:  L4-5 Level.  Imaging: Fluoroscopic guidance         Anesthesia: Local anesthesia (1-2% Lidocaine ) Sedation: PO Valium  DOS: 10/22/2023  Performed by: Wallie Sherry, MD   Purpose: Diagnostic/Therapeutic Indications: Lumbar radicular pain of intraspinal etiology of more than 4 weeks that has failed to respond to conservative therapy and is severe enough to impact quality of life or function. 1. Lumbar radiculopathy   2. Chronic pain syndrome     NAS-11 Pain score:        Pre-procedure: 8 /10        Post-procedure: 6 /10   Post-Procedure Evaluation   Effectiveness:  Initial hour after procedure: 100 % . Subsequent 4-6 hours post-procedure: 100 % .  Analgesia past initial 6 hours: 80 % . Ongoing improvement:  Analgesic:  [name] received  a diagnostic/therapeutic  Lumbar epidural steroid injection (LESI) (interlaminar) injection on October 22, 2023.  She reports 100% pain relief and functional improvement during local anesthetic phase, followed sustained 80% pain relief and functional improvement since the procedure.  Function: [name] reports improvement in function ROM: [name] reports improvement in ROM  Pharmacotherapy Assessment   Hydrocodone -acetaminophen  (Norco) 7.5-325 mg tablet 3 times daily as needed for pain. MME=22.50 Monitoring: [place] PMP: PDMP reviewed during this encounter.       Pharmacotherapy: No side-effects or adverse reactions reported. Compliance: No problems identified. Effectiveness: Clinically acceptable.  Erlene Doyal SAUNDERS, [place]  12/15/2023  8:38 AM  Sign when Signing Visit Nursing Pain Medication Assessment:  Safety precautions to be maintained throughout the outpatient stay will include: orient to surroundings, keep bed in low position, maintain call bell within reach at all times, provide assistance with transfer out of bed and ambulation.  Medication Inspection Compliance: Pill count conducted under aseptic conditions, in front of the patient. Neither the pills nor the bottle was removed from the patient's sight at any time. Once count was completed pills were immediately returned to the patient in their original bottle.  Medication: Hydrocodone /APAP Pill/Patch Count: 29 of 90 patches remain Pill/Patch Appearance: Markings consistent with prescribed medication Bottle Appearance: Standard pharmacy container. Clearly labeled. Filled Date: 08 / 28 / 2025 Last Medication intake:  Today    UDS:  Summary  Date Value Ref Range Status  12/17/2022 Note  Final    Comment:    ==================================================================== [facility]) ==================================================================== Test                             Result       Flag        Units  Drug Present and Declared for Prescription Verification   Hydrocodone                     1238         EXPECTED   ng/mg creat   Hydromorphone                  328          EXPECTED   ng/mg creat   Dihydrocodeine                 349          EXPECTED   ng/mg creat   Norhydrocodone                 2331         EXPECTED   ng/mg creat    Sources of hydrocodone  include scheduled prescription medications.    Hydromorphone, dihydrocodeine and norhydrocodone are expected    metabolites of hydrocodone . Hydromorphone and dihydrocodeine are    also available as scheduled prescription medications.  ==================================================================== Test                      Result    Flag   Units      Ref Range   Creatinine              39               mg/dL      >=79 ==================================================================== Declared Medications:  The flagging and interpretation on this report  are based on the  following declared medications.  Unexpected results may arise from  inaccuracies in the declared medications.   **Note: The testing scope of this panel includes these medications:   Hydrocodone  (Norco)   **Note: The testing scope of this panel does not include the  following reported medications:   Acetaminophen  (Norco)  Albuterol  (Ventolin  HFA)  Aripiprazole  (Abilify )  Aspirin   Budesonide (Symbicort)  Cetirizine (Zyrtec)  Cysteamine (Procysbi)  Docusate (Colace)  Dulaglutide (Trulicity)  Famotidine  (Pepcid )  Fluticasone  (Flonase )  Formoterol  (Symbicort)  Glipizide  (Glucotrol )  Insulin  (NovoLog )  Linaclotide  (Linzess )  Losartan  (Cozaar )  Magnesium  (Mag-Ox)  Meclizine (Antivert)  Metformin (Glucophage)  Mirtazapine  (Remeron )  Montelukast  (Singulair )  Omeprazole (Prilosec)  Ondansetron  (Zofran )  Pregabalin  (Lyrica )  Propranolol  (Inderal )  Rizatriptan (Maxalt)  Rosuvastatin  (Crestor )  Solifenacin (Vesicare)  Topical Lidocaine   (Lidoderm )  Topiramate  (Topamax )  Trazodone  (Desyrel )  Venlafaxine  (Effexor ) ==================================================================== For clinical consultation, please call [phone]. ====================================================================     No results found for: CBDTHCR No results found for: D8THCCBX No results found for: D9THCCBX  ROS  Constitutional: Denies any fever or chills Gastrointestinal: No reported hemesis, hematochezia, vomiting, or acute GI distress Musculoskeletal: Low back pain, bilateral hip pain (R>L) Neurological: No reported episodes of acute onset apraxia, aphasia, dysarthria, agnosia, amnesia, paralysis, loss of coordination, or loss of consciousness  Medication Review  Accu-Chek Guide, HYDROcodone -acetaminophen , albuterol , aspirin , cephALEXin , cetirizine, docusate sodium, famotidine , glucose blood, lidocaine , linaclotide , meclizine, metFORMIN, mirtazapine , omeprazole, ondansetron , pregabalin , propranolol , rizatriptan, rosuvastatin , topiramate , traZODone , and venlafaxine  XR  History Review  Allergy: [name] is allergic to tramadol, benadryl [diphenhydramine hcl], bextra [valdecoxib], invokana [canagliflozin], and sulfamethoxazole-trimethoprim. Drug: [name]  reports current drug use. Drug: Cocaine. Alcohol:  reports current alcohol use of about 2.0 standard drinks of alcohol per week. Tobacco:  reports that she has been smoking cigarettes. She has a 36 pack-year smoking history. She has been exposed to tobacco smoke. She has quit using smokeless tobacco. Social: [name]  reports that she has been smoking cigarettes. She has a 36 pack-year smoking history. She has been exposed to tobacco smoke. She has quit using smokeless tobacco. She reports current alcohol use of about 2.0 standard drinks of alcohol per week. She reports current drug use. Drug: Cocaine. Medical:  has a past medical history of Abnormal  CBC, Asthma, Asthma, BPPV (benign paroxysmal positional vertigo), Cardiac murmur, unspecified (08/28/2016), COPD (chronic obstructive pulmonary disease) (HCC), CPAP (continuous positive airway pressure) dependence, Degenerative disc disease, lumbar, Depression, Diabetes mellitus, Diverticulitis (10/2016), Elevated LFTs, Fatty liver, Fibromyalgia, Fibromyalgia, GERD (gastroesophageal reflux disease), Headache, History of degenerative disc disease, Hyperlipidemia, Hypertension, and Sleep apnea. Surgical: Ms. Seckel  has a past surgical history that includes Abdominal surgery; Abdominal hysterectomy; Esophagogastroduodenoscopy (egd) with propofol  (N/A, 02/20/2017); Colonoscopy with propofol  (N/A, 02/20/2017); Upper esophageal endoscopic ultrasound (eus) (N/A, 04/03/2017); Diverticulitis; EUS (N/A, 01/08/2018); Cholecystectomy (N/A, 04/27/2018); Cataract extraction w/PHACO (Left, 09/07/2019); Cataract extraction w/PHACO (Right, 09/28/2019); and RIGHT/LEFT HEART CATH AND CORONARY ANGIOGRAPHY (Bilateral, 12/19/2021). Family: family history includes Diabetes in her mother; Hypertension in her mother.  Laboratory Chemistry Profile   Renal Lab Results  Component Value Date   BUN 8 12/11/2023   CREATININE 0.51 12/11/2023   BCR 14 11/27/2017   GFRAA >60 08/09/2019   GFRNONAA >60 12/11/2023    Hepatic Lab Results  Component Value Date   AST 30 12/11/2023   ALT 35 12/11/2023   ALBUMIN 3.6 12/11/2023   ALKPHOS 110 12/11/2023   LIPASE 47 04/19/2022   AMMONIA 46 (H) 04/05/2021  Electrolytes Lab Results  Component Value Date   NA 142 12/11/2023   K 3.4 (L) 12/11/2023   CL 107 12/11/2023   CALCIUM  8.9 12/11/2023   MG 1.4 (L) 04/19/2022    Bone No results found for: VD25OH, CI874NY7UNU, CI6874NY7, CI7874NY7, 25OHVITD1, 25OHVITD2, 25OHVITD3, TESTOFREE, TESTOSTERONE  Inflammation (CRP: Acute Phase) (ESR: Chronic Phase) Lab Results  Component Value Date   CRP <1 11/27/2017    ESRSEDRATE 17 11/27/2017   LATICACIDVEN 1.3 04/05/2021         Note: Above Lab results reviewed.  Recent Imaging Review  CT Head Wo Contrast CLINICAL DATA:  headache  EXAM: CT HEAD WITHOUT CONTRAST  TECHNIQUE: Contiguous axial images were obtained from the base of the skull through the vertex without intravenous contrast.  RADIATION DOSE REDUCTION: This exam was performed according to the departmental dose-optimization program which includes automated exposure control, adjustment of the mA and/or kV according to patient size and/or use of iterative reconstruction technique.  COMPARISON:  CT head 04/04/2021  FINDINGS: Brain:  No evidence of large-territorial acute infarction. No parenchymal hemorrhage. No mass lesion. No extra-axial collection.  No mass effect or midline shift. No hydrocephalus. Basilar cisterns are patent.  Vascular: No hyperdense vessel.  Skull: No acute fracture or focal lesion.  Sinuses/Orbits: Paranasal sinuses and mastoid air cells are clear. The orbits are unremarkable.  Other: None.  IMPRESSION: No acute intracranial abnormality.  Electronically Signed   By: Morgane  Naveau M.D.   On: 12/11/2023 17:28 Note: Reviewed        Physical Exam  Vitals: BP (!) 150/105 (BP Location: Right Arm, Patient Position: Sitting)   Pulse 91   Temp (!) 97.3 F (36.3 C) (Temporal)   Resp 18   Ht 4' 11 (1.499 m)   Wt 87 lb (39.5 kg)   LMP  (LMP Unknown) Comment: many years  SpO2 95%   BMI 17.57 kg/m  BMI: Estimated body mass index is 17.57 kg/m as calculated from the following:   Height as of this encounter: 4' 11 (1.499 m).   Weight as of this encounter: 87 lb (39.5 kg). Ideal: Female patients must weigh at least 45.5 kg to calculate ideal body weight General appearance: Well nourished, well developed, and well hydrated. In no apparent acute distress Mental status: Alert, oriented x 3 (person, place, & time)       Respiratory: No evidence  of acute respiratory distress Eyes: PERLA  Lumbar Exam  Skin & Axial Inspection: No masses, redness, or swelling Alignment: Symmetrical Functional ROM: Pain restricted ROM       Stability: No instability detected Muscle Tone/Strength: Functionally intact. No obvious neuro-muscular anomalies detected. Sensory (Neurological): Musculoskeletal pain pattern Palpation: No palpable anomalies       Provocative Tests: Hyperextension/rotation test: deferred today       Lumbar quadrant test (Kemp's test): deferred today       Lateral bending test: (+) due to pain. Patrick's Maneuver: (+) for bilateral hip arthralgia             FABER* test: (+) for bilateral hip arthralgia             *(Flexion, ABduction and External Rotation) Assessment   Diagnosis Status  1. Lumbar radiculopathy   2. Chronic pain syndrome   3. Chronic, continuous use of opioids   4. Chronic painful diabetic neuropathy (HCC)   5. Chronic myofascial pain   6. Arthritis of left knee   7. Primary osteoarthritis of right knee  8. Chronic hip pain, bilateral    Controlled Controlled Controlled   Updated Problems: Problem  Chronic Hip Pain, Bilateral    Plan of Care  Problem-specific:  Assessment and Plan The patient will continue on current medication regimen.  Prescribing drug monitoring (PDMP) reviewed; findings consistent with the use of prescribed medication and no evidence of narcotic misuse or abuse. Routine UDS ordered today.  No other new issues or problems reported at this visit.  Schedule follow-up in 90 days for medication management.  Plan: Order Bilateral Hip X-Rays  [name] has a current medication list which includes the following long-term medication(s): albuterol , famotidine , metformin, mirtazapine , omeprazole, pregabalin , propranolol , rizatriptan, topiramate , trazodone , and venlafaxine  xr.  Pharmacotherapy (Medications Ordered): Meds ordered this encounter  Medications    HYDROcodone -acetaminophen  (NORCO) 7.5-325 MG tablet    Sig: Take 1 tablet by mouth 3 (three) times daily as needed for severe pain (pain score 7-10). Must last 30 days    Dispense:  90 tablet    Refill:  0    Chronic Pain: STOP Act (Not applicable) Fill 1 day early if closed on refill date. Avoid benzodiazepines within 8 hours of opioids   HYDROcodone -acetaminophen  (NORCO) 7.5-325 MG tablet    Sig: Take 1 tablet by mouth 3 (three) times daily as needed for severe pain (pain score 7-10). Must last 30 days    Dispense:  90 tablet    Refill:  0    Chronic Pain: STOP Act (Not applicable) Fill 1 day early if closed on refill date. Avoid benzodiazepines within 8 hours of opioids   HYDROcodone -acetaminophen  (NORCO) 7.5-325 MG tablet    Sig: Take 1 tablet by mouth 3 (three) times daily as needed for severe pain (pain score 7-10). Must last 30 days    Dispense:  90 tablet    Refill:  0    Chronic Pain: STOP Act (Not applicable) Fill 1 day early if closed on refill date. Avoid benzodiazepines within 8 hours of opioids   Orders:  Orders Placed This Encounter  Procedures   DG HIPS BILAT W OR W/O PELVIS MIN 5 VIEWS    Standing Status:   Future    Expiration Date:   03/15/2024    Scheduling Instructions:     Please make sure that the patient understands that this needs to be done as soon as possible. Never have the patient do the imaging just before the next appointment. Inform patient that having the imaging done within the [facility] will expedite the availability of the results and will provide      imaging availability to the requesting physician. In addition inform the patient that the imaging order has an expiration date and will not be renewed if not done within the active period.    Reason for Exam (SYMPTOM  OR DIAGNOSIS REQUIRED):   Chronic bilateral hip pain (M25.551, M25.552, G89.29)    Preferred imaging location?:   [facility]    Release to patient:   Immediate    Call Results-  Best Contact Number?:   [phone] [facility]), Urine    Volume: 30 ml(s). Minimum 3 ml of urine is needed. Document temperature of fresh sample. Indications: Long term (current) use of opiate analgesic (S20.108)    Release to patient:   Immediate        Return in about 2 weeks (around 12/29/2023) for (VV), review of ordered tests, Naimah Yingst  Caleel Kiner NP.    Recent Visits Date Type Provider Dept  10/22/23 Procedure visit Marcelino Nurse, MD [facility]  09/16/23 Office Visit Marcelino Nurse, MD [facility]  Showing recent visits within past 90 days and meeting all other requirements Today's Visits Date Type Provider Dept  12/15/23 Office Visit Geanna Divirgilio K, NP [facility]  Showing today's visits and meeting all other requirements Future Appointments Date Type Provider Dept  01/08/24 Appointment Hakan Nudelman K, NP [facility]  03/11/24 Appointment Tod Abrahamsen K, NP [facility]  Showing future appointments within next 90 days and meeting all other requirements  I discussed the assessment and treatment plan with the patient. The patient was provided an opportunity to ask questions and all were answered. The patient agreed with the plan and demonstrated an understanding of the instructions.  Patient advised to call back or seek an in-person evaluation if the symptoms or condition worsens.  I personally spent a total of 30 minutes in the care of the patient today including preparing to see the patient, getting/reviewing separately obtained history, performing a medically appropriate exam/evaluation, counseling and educating, placing orders, documenting clinical information in the EHR, and independently interpreting results.  Duration of encounter:  minutes.  Total time on encounter, as per AMA guidelines included both the face-to-face and non-face-to-face time  personally spent by the physician and/or other qualified health care professional(s) on the day of the encounter (includes time in activities that require the physician or other qualified health care professional and does not include time in activities normally performed by clinical staff). Physician's time may include the following activities when performed: Preparing to see the patient (e.g., pre-charting review of records, searching for previously ordered imaging, lab work, and nerve conduction tests) Review of prior analgesic pharmacotherapies. Reviewing PMP Interpreting ordered tests (e.g., lab work, imaging, nerve conduction tests) Performing post-procedure evaluations, including interpretation of diagnostic procedures Obtaining and/or reviewing separately obtained history Performing a medically appropriate examination and/or evaluation Counseling and educating the patient/family/caregiver Ordering medications, tests, or procedures Referring and communicating with other health care professionals (when not separately reported) Documenting clinical information in the electronic or other health record Independently interpreting results (not separately reported) and communicating results to the patient/ family/caregiver Care coordination (not separately reported)  Note by: Janet Decesare K Cleotha Tsang, NP (TTS and AI technology used. I apologize for any typographical errors that were not detected and corrected.) Date: 12/15/2023; Time: 8:51 AM

## 2023-12-15 NOTE — Progress Notes (Signed)
 Nursing Pain Medication Assessment:  Safety precautions to be maintained throughout the outpatient stay will include: orient to surroundings, keep bed in low position, maintain call bell within reach at all times, provide assistance with transfer out of bed and ambulation.  Medication Inspection Compliance: Pill count conducted under aseptic conditions, in front of the patient. Neither the pills nor the bottle was removed from the patient's sight at any time. Once count was completed pills were immediately returned to the patient in their original bottle.  Medication: Hydrocodone /APAP Pill/Patch Count: 29 of 90 patches remain Pill/Patch Appearance: Markings consistent with prescribed medication Bottle Appearance: Standard pharmacy container. Clearly labeled. Filled Date: 08 / 28 / 2025 Last Medication intake:  Today

## 2023-12-18 LAB — TOXASSURE SELECT 13 (MW), URINE

## 2023-12-22 ENCOUNTER — Telehealth: Payer: Self-pay | Admitting: Nurse Practitioner

## 2023-12-22 NOTE — Telephone Encounter (Signed)
 done

## 2023-12-22 NOTE — Telephone Encounter (Signed)
 Patients meds need PA

## 2023-12-28 ENCOUNTER — Emergency Department
Admission: EM | Admit: 2023-12-28 | Discharge: 2023-12-28 | Disposition: A | Discharge status: Home / Self Care | Payer: MEDICAID | Attending: Emergency Medicine | Admitting: Emergency Medicine

## 2023-12-28 ENCOUNTER — Emergency Department: Payer: MEDICAID

## 2023-12-28 ENCOUNTER — Other Ambulatory Visit: Payer: Self-pay

## 2023-12-28 DIAGNOSIS — W01198A Fall on same level from slipping, tripping and stumbling with subsequent striking against other object, initial encounter: Secondary | ICD-10-CM | POA: Diagnosis not present

## 2023-12-28 DIAGNOSIS — G8929 Other chronic pain: Secondary | ICD-10-CM

## 2023-12-28 DIAGNOSIS — M1712 Unilateral primary osteoarthritis, left knee: Secondary | ICD-10-CM

## 2023-12-28 DIAGNOSIS — S79912A Unspecified injury of left hip, initial encounter: Secondary | ICD-10-CM | POA: Diagnosis present

## 2023-12-28 DIAGNOSIS — S32592A Other specified fracture of left pubis, initial encounter for closed fracture: Secondary | ICD-10-CM | POA: Insufficient documentation

## 2023-12-28 DIAGNOSIS — M542 Cervicalgia: Secondary | ICD-10-CM | POA: Insufficient documentation

## 2023-12-28 DIAGNOSIS — G894 Chronic pain syndrome: Secondary | ICD-10-CM

## 2023-12-28 DIAGNOSIS — S0990XA Unspecified injury of head, initial encounter: Secondary | ICD-10-CM | POA: Diagnosis not present

## 2023-12-28 DIAGNOSIS — W19XXXA Unspecified fall, initial encounter: Secondary | ICD-10-CM

## 2023-12-28 DIAGNOSIS — M5416 Radiculopathy, lumbar region: Secondary | ICD-10-CM

## 2023-12-28 DIAGNOSIS — M1711 Unilateral primary osteoarthritis, right knee: Secondary | ICD-10-CM

## 2023-12-28 DIAGNOSIS — S329XXA Fracture of unspecified parts of lumbosacral spine and pelvis, initial encounter for closed fracture: Secondary | ICD-10-CM

## 2023-12-28 DIAGNOSIS — F1190 Opioid use, unspecified, uncomplicated: Secondary | ICD-10-CM

## 2023-12-28 LAB — COMPREHENSIVE METABOLIC PANEL WITH GFR
ALT: 36 U/L (ref 0–44)
AST: 31 U/L (ref 15–41)
Albumin: 3.4 g/dL — ABNORMAL LOW (ref 3.5–5.0)
Alkaline Phosphatase: 70 U/L (ref 38–126)
Anion gap: 12 (ref 5–15)
BUN: 17 mg/dL (ref 8–23)
CO2: 25 mmol/L (ref 22–32)
Calcium: 8.9 mg/dL (ref 8.9–10.3)
Chloride: 100 mmol/L (ref 98–111)
Creatinine, Ser: 0.7 mg/dL (ref 0.44–1.00)
GFR, Estimated: >60 mL/min (ref >60)
Glucose, Bld: 440 mg/dL — ABNORMAL HIGH (ref 70–99)
Potassium: 4 mmol/L (ref 3.5–5.1)
Sodium: 137 mmol/L (ref 135–145)
Total Bilirubin: 0.8 mg/dL (ref 0.0–1.2)
Total Protein: 6.4 g/dL — ABNORMAL LOW (ref 6.5–8.1)

## 2023-12-28 LAB — RESP PANEL BY RT-PCR (RSV, FLU A&B, COVID)  RVPGX2
Influenza A by PCR: NEGATIVE
Influenza B by PCR: NEGATIVE
Resp Syncytial Virus by PCR: NEGATIVE
SARS Coronavirus 2 by RT PCR: NEGATIVE

## 2023-12-28 LAB — CBC
HCT: 41.2 % (ref 36.0–46.0)
Hemoglobin: 13.4 g/dL (ref 12.0–15.0)
MCH: 28.4 pg (ref 26.0–34.0)
MCHC: 32.5 g/dL (ref 30.0–36.0)
MCV: 87.3 fL (ref 80.0–100.0)
Platelets: 175 K/uL (ref 150–400)
RBC: 4.72 MIL/uL (ref 3.87–5.11)
RDW: 13.1 % (ref 11.5–15.5)
WBC: 10.8 K/uL — ABNORMAL HIGH (ref 4.0–10.5)
nRBC: 0 % (ref 0.0–0.2)

## 2023-12-28 MED ORDER — HYDROCODONE-ACETAMINOPHEN 7.5-325 MG PO TABS: 1.0000 | ORAL_TABLET | Freq: Four times a day (QID) | ORAL | 0 refills | Status: AC | PRN

## 2023-12-28 MED ORDER — FENTANYL CITRATE PF 50 MCG/ML IJ SOSY
50.0000 ug | PREFILLED_SYRINGE | Freq: Once | INTRAMUSCULAR | Status: AC
  Administered 2023-12-28: 50 ug via INTRAVENOUS
  Filled 2023-12-28: qty 1

## 2023-12-28 NOTE — ED Notes (Signed)
 Patient ambulatory to the wheel chair, then to restroom, and then back to bed without incident.

## 2023-12-28 NOTE — Discharge Instructions (Addendum)
 You have a fracture of the left pubic inferior ramus.  This is a fracture in the pelvis.  It will heal on its own but it will take time.  You are allowed to walk on it as tolerated.  We discussed admission but you opted to go home which is reasonable.  You should follow-up with the orthopedists in about a week

## 2023-12-28 NOTE — ED Triage Notes (Signed)
 Pt c/o posterior head pain and L groin pain after falling last night after hitting a table and falling to the floor. Pt said she tripped over a rug. She is unsure of LOC.

## 2023-12-28 NOTE — ED Provider Notes (Signed)
 [facility] Provider Note    Event Date/Time   First MD Initiated Contact with Patient 12/28/23 1113     (approximate)   History   Fall   HPI  [name] is a 63 y.o. female who presents with complaints of a fall which occurred last night.  Patient reports she tripped on a rug, fell injuring her left hip and she think she may have hit her head.  She has some mild neck soreness, no other injuries reported     Physical Exam   Triage Vital Signs: ED Triage Vitals  Encounter Vitals Group     BP 12/28/23 1031 133/73     Girls Systolic BP Percentile --      Girls Diastolic BP Percentile --      Boys Systolic BP Percentile --      Boys Diastolic BP Percentile --      Pulse Rate 12/28/23 1031 (!) 106     Resp 12/28/23 1031 16     Temp 12/28/23 1031 99.6 F (37.6 C)     Temp Source 12/28/23 1031 Oral     SpO2 12/28/23 1031 98 %     Weight 12/28/23 1035 39.5 kg (87 lb)     Height 12/28/23 1035 1.499 m (4' 11)     Head Circumference --      Peak Flow --      Pain Score 12/28/23 1034 5     Pain Loc --      Pain Education --      Exclude from Growth Chart --     Most recent vital signs: Vitals:   12/28/23 1031  BP: 133/73  Pulse: (!) 106  Resp: 16  Temp: 99.6 F (37.6 C)  SpO2: 98%     General: Awake, no distress.  CV:  Good peripheral perfusion.  Resp:  Normal effort.  Abd:  No distention.  Abdomen soft, nontender Other:  Pain with extension on the left hip as well as pain with axial load on the left hip.  Extremities warm and well-perfused, no knee or ankle injuries.  Normal range of motion of the upper extremities, no vertebral tenderness to palpation, no chest wall tenderness to palpation   ED Results / Procedures / Treatments   Labs (all labs ordered are listed, but only abnormal results are displayed) Labs Reviewed  CBC - Abnormal; Notable for the following components:      Result Value   WBC 10.8 (*)    All other  components within normal limits  COMPREHENSIVE METABOLIC PANEL WITH GFR - Abnormal; Notable for the following components:   Glucose, Bld 440 (*)    Total Protein 6.4 (*)    Albumin 3.4 (*)    All other components within normal limits  RESP PANEL BY RT-PCR (RSV, FLU A&B, COVID)  RVPGX2     EKG     RADIOLOGY CT head viewed interpret by me, no acute abnormality Patient has left inferior pubic ramus fracture    PROCEDURES:  Critical Care performed:   Procedures   MEDICATIONS ORDERED IN ED: Medications  fentaNYL  (SUBLIMAZE ) injection 50 mcg (50 mcg Intravenous Given 12/28/23 1243)     IMPRESSION / MDM / ASSESSMENT AND PLAN / ED COURSE  I reviewed the triage vital signs and the nursing notes. Patient's presentation is most consistent with acute presentation with potential threat to life or bodily function.  Patient presents with fall, head injury, hip pain as detailed above,  differential includes concussion, minor head injury, ICH, hip fracture, hip sprain, pelvic fracture  CT head and cervical spine are reassuring  Pending hip x-ray.  Additionally patient's temperature is mildly elevated and she is mildly tachycardic, will send for COVID swab, denies shortness of breath or cough at this time  Hip x-ray demonstrates left inferior pubic ramus fracture, the patient is unable to ambulate on her own because of pain, will give IV fentanyl .  Labs pending  Lab work overall reassuring, her glucose is elevated but no evidence of DKA, this is a chronic problem for her.  Discussed admission with the patient however she refused, she reports she will be leaving with her daughter and feels that she will be able to manage this at home with analgesics and a walker.        FINAL CLINICAL IMPRESSION(S) / ED DIAGNOSES   Final diagnoses:  Fall, initial encounter  Closed nondisplaced fracture of pelvis, unspecified part of pelvis, initial encounter (HCC)     Rx / DC Orders   ED  Discharge Orders          Ordered    HYDROcodone -acetaminophen  (NORCO) 7.5-325 MG tablet  Every 6 hours PRN       Note to Pharmacy: Chronic Pain: STOP Act (Not applicable) Fill 1 day early if closed on refill date. Avoid benzodiazepines within 8 hours of opioids   12/28/23 1331             Note:  This document was prepared using Dragon voice recognition software and may include unintentional dictation errors.   Arlander Charleston, MD 12/28/23 1339

## 2023-12-29 ENCOUNTER — Encounter: Payer: Self-pay | Admitting: *Deleted

## 2023-12-29 ENCOUNTER — Emergency Department: Payer: MEDICAID

## 2023-12-29 ENCOUNTER — Other Ambulatory Visit: Payer: Self-pay

## 2023-12-29 ENCOUNTER — Observation Stay
Admission: EM | Admit: 2023-12-29 | Discharge: 2023-12-30 | Disposition: A | Discharge status: Home / Self Care | Payer: MEDICAID | Attending: Internal Medicine | Admitting: Internal Medicine

## 2023-12-29 DIAGNOSIS — E785 Hyperlipidemia, unspecified: Secondary | ICD-10-CM | POA: Insufficient documentation

## 2023-12-29 DIAGNOSIS — J189 Pneumonia, unspecified organism: Secondary | ICD-10-CM | POA: Diagnosis not present

## 2023-12-29 DIAGNOSIS — N179 Acute kidney failure, unspecified: Secondary | ICD-10-CM | POA: Insufficient documentation

## 2023-12-29 DIAGNOSIS — E119 Type 2 diabetes mellitus without complications: Secondary | ICD-10-CM | POA: Diagnosis not present

## 2023-12-29 DIAGNOSIS — I517 Cardiomegaly: Secondary | ICD-10-CM | POA: Insufficient documentation

## 2023-12-29 DIAGNOSIS — F17210 Nicotine dependence, cigarettes, uncomplicated: Secondary | ICD-10-CM | POA: Insufficient documentation

## 2023-12-29 DIAGNOSIS — Z7982 Long term (current) use of aspirin: Secondary | ICD-10-CM | POA: Insufficient documentation

## 2023-12-29 DIAGNOSIS — I959 Hypotension, unspecified: Secondary | ICD-10-CM

## 2023-12-29 DIAGNOSIS — F17200 Nicotine dependence, unspecified, uncomplicated: Secondary | ICD-10-CM | POA: Diagnosis present

## 2023-12-29 DIAGNOSIS — G894 Chronic pain syndrome: Secondary | ICD-10-CM | POA: Insufficient documentation

## 2023-12-29 DIAGNOSIS — I5032 Chronic diastolic (congestive) heart failure: Secondary | ICD-10-CM | POA: Diagnosis not present

## 2023-12-29 DIAGNOSIS — R0602 Shortness of breath: Secondary | ICD-10-CM | POA: Diagnosis present

## 2023-12-29 DIAGNOSIS — F32A Depression, unspecified: Secondary | ICD-10-CM | POA: Insufficient documentation

## 2023-12-29 DIAGNOSIS — I10 Essential (primary) hypertension: Secondary | ICD-10-CM | POA: Diagnosis not present

## 2023-12-29 DIAGNOSIS — Z79899 Other long term (current) drug therapy: Secondary | ICD-10-CM | POA: Insufficient documentation

## 2023-12-29 DIAGNOSIS — F419 Anxiety disorder, unspecified: Secondary | ICD-10-CM | POA: Insufficient documentation

## 2023-12-29 DIAGNOSIS — R5383 Other fatigue: Secondary | ICD-10-CM | POA: Diagnosis present

## 2023-12-29 DIAGNOSIS — M84454A Pathological fracture, pelvis, initial encounter for fracture: Secondary | ICD-10-CM | POA: Insufficient documentation

## 2023-12-29 DIAGNOSIS — J439 Emphysema, unspecified: Secondary | ICD-10-CM | POA: Insufficient documentation

## 2023-12-29 DIAGNOSIS — J449 Chronic obstructive pulmonary disease, unspecified: Secondary | ICD-10-CM | POA: Diagnosis not present

## 2023-12-29 DIAGNOSIS — E43 Unspecified severe protein-calorie malnutrition: Secondary | ICD-10-CM | POA: Insufficient documentation

## 2023-12-29 DIAGNOSIS — I700 Atherosclerosis of aorta: Secondary | ICD-10-CM | POA: Diagnosis not present

## 2023-12-29 DIAGNOSIS — J690 Pneumonitis due to inhalation of food and vomit: Secondary | ICD-10-CM | POA: Diagnosis present

## 2023-12-29 DIAGNOSIS — F418 Other specified anxiety disorders: Secondary | ICD-10-CM | POA: Diagnosis present

## 2023-12-29 DIAGNOSIS — S329XXA Fracture of unspecified parts of lumbosacral spine and pelvis, initial encounter for closed fracture: Secondary | ICD-10-CM | POA: Diagnosis present

## 2023-12-29 DIAGNOSIS — I110 Hypertensive heart disease with heart failure: Secondary | ICD-10-CM | POA: Diagnosis not present

## 2023-12-29 DIAGNOSIS — G4733 Obstructive sleep apnea (adult) (pediatric): Secondary | ICD-10-CM | POA: Diagnosis present

## 2023-12-29 LAB — CBC
HCT: 42.5 % (ref 36.0–46.0)
Hemoglobin: 13.1 g/dL (ref 12.0–15.0)
MCH: 28.7 pg (ref 26.0–34.0)
MCHC: 30.8 g/dL (ref 30.0–36.0)
MCV: 93 fL (ref 80.0–100.0)
Platelets: 159 K/uL (ref 150–400)
RBC: 4.57 MIL/uL (ref 3.87–5.11)
RDW: 13.4 % (ref 11.5–15.5)
WBC: 12 K/uL — ABNORMAL HIGH (ref 4.0–10.5)
nRBC: 0 % (ref 0.0–0.2)

## 2023-12-29 LAB — TROPONIN I (HIGH SENSITIVITY)
Troponin I (High Sensitivity): 7 ng/L (ref <18)
Troponin I (High Sensitivity): 9 ng/L (ref <18)

## 2023-12-29 LAB — URINE DRUG SCREEN, QUALITATIVE (ARMC ONLY)
Amphetamines, Ur Screen: NOT DETECTED
Barbiturates, Ur Screen: NOT DETECTED
Benzodiazepine, Ur Scrn: NOT DETECTED
Cannabinoid 50 Ng, Ur ~~LOC~~: NOT DETECTED
Cocaine Metabolite,Ur ~~LOC~~: POSITIVE — AB
MDMA (Ecstasy)Ur Screen: NOT DETECTED
Methadone Scn, Ur: NOT DETECTED
Opiate, Ur Screen: POSITIVE — AB
Phencyclidine (PCP) Ur S: NOT DETECTED
Tricyclic, Ur Screen: NOT DETECTED

## 2023-12-29 LAB — LACTIC ACID, PLASMA
Lactic Acid, Venous: 1.8 mmol/L (ref 0.5–1.9)
Lactic Acid, Venous: 2.3 mmol/L (ref 0.5–1.9)

## 2023-12-29 LAB — STREP PNEUMONIAE URINARY ANTIGEN: Strep Pneumo Urinary Antigen: NEGATIVE

## 2023-12-29 LAB — BASIC METABOLIC PANEL WITH GFR
Anion gap: 12 (ref 5–15)
BUN: 17 mg/dL (ref 8–23)
CO2: 21 mmol/L — ABNORMAL LOW (ref 22–32)
Calcium: 8.1 mg/dL — ABNORMAL LOW (ref 8.9–10.3)
Chloride: 105 mmol/L (ref 98–111)
Creatinine, Ser: 1.43 mg/dL — ABNORMAL HIGH (ref 0.44–1.00)
GFR, Estimated: 41 mL/min — ABNORMAL LOW (ref >60)
Glucose, Bld: 183 mg/dL — ABNORMAL HIGH (ref 70–99)
Potassium: 4.2 mmol/L (ref 3.5–5.1)
Sodium: 138 mmol/L (ref 135–145)

## 2023-12-29 LAB — URINALYSIS, ROUTINE W REFLEX MICROSCOPIC
Bilirubin Urine: NEGATIVE
Glucose, UA: >=500 mg/dL — AB
Hgb urine dipstick: NEGATIVE
Ketones, ur: NEGATIVE mg/dL
Leukocytes,Ua: NEGATIVE
Nitrite: NEGATIVE
Protein, ur: NEGATIVE mg/dL
Specific Gravity, Urine: 1.033 — ABNORMAL HIGH (ref 1.005–1.030)
pH: 5 (ref 5.0–8.0)

## 2023-12-29 LAB — GLUCOSE, CAPILLARY: Glucose-Capillary: 152 mg/dL — ABNORMAL HIGH (ref 70–99)

## 2023-12-29 LAB — BRAIN NATRIURETIC PEPTIDE: B Natriuretic Peptide: 169.1 pg/mL — ABNORMAL HIGH (ref 0.0–100.0)

## 2023-12-29 MED ORDER — DM-GUAIFENESIN ER 30-600 MG PO TB12: 1.0000 | ORAL_TABLET | Freq: Two times a day (BID) | ORAL | Status: DC | PRN

## 2023-12-29 MED ORDER — ALBUTEROL SULFATE (2.5 MG/3ML) 0.083% IN NEBU: 2.5000 mg | INHALATION_SOLUTION | RESPIRATORY_TRACT | Status: DC | PRN

## 2023-12-29 MED ORDER — ALBUTEROL SULFATE HFA 108 (90 BASE) MCG/ACT IN AERS: 2.0000 | INHALATION_SPRAY | RESPIRATORY_TRACT | Status: DC | PRN

## 2023-12-29 MED ORDER — ENSURE PLUS HIGH PROTEIN PO LIQD
237.0000 mL | Freq: Two times a day (BID) | ORAL | Status: DC
  Administered 2023-12-30 (×2): 237 mL via ORAL

## 2023-12-29 MED ORDER — ENOXAPARIN SODIUM 40 MG/0.4ML IJ SOSY: 40.0000 mg | PREFILLED_SYRINGE | INTRAMUSCULAR | Status: DC

## 2023-12-29 MED ORDER — CEFTRIAXONE SODIUM 1 G IJ SOLR
1.0000 g | Freq: Once | INTRAMUSCULAR | Status: AC
  Administered 2023-12-29: 1 g via INTRAVENOUS
  Filled 2023-12-29: qty 10

## 2023-12-29 MED ORDER — SODIUM CHLORIDE 0.9 % IV SOLN: 1.0000 g | INTRAVENOUS | Status: DC

## 2023-12-29 MED ORDER — AMPICILLIN-SULBACTAM SODIUM 3 (2-1) G IJ SOLR
3.0000 g | Freq: Two times a day (BID) | INTRAMUSCULAR | Status: DC
  Administered 2023-12-30: 3 g via INTRAVENOUS
  Filled 2023-12-29: qty 8

## 2023-12-29 MED ORDER — SODIUM CHLORIDE 0.9 % IV BOLUS
1000.0000 mL | Freq: Once | INTRAVENOUS | Status: AC
  Administered 2023-12-29: 1000 mL via INTRAVENOUS

## 2023-12-29 MED ORDER — IOHEXOL 350 MG/ML SOLN
80.0000 mL | Freq: Once | INTRAVENOUS | Status: AC | PRN
  Administered 2023-12-29: 80 mL via INTRAVENOUS

## 2023-12-29 MED ORDER — ACETAMINOPHEN 325 MG PO TABS
650.0000 mg | ORAL_TABLET | Freq: Four times a day (QID) | ORAL | Status: DC | PRN
  Administered 2023-12-30 (×2): 650 mg via ORAL
  Filled 2023-12-29 (×2): qty 2

## 2023-12-29 MED ORDER — NICOTINE 21 MG/24HR TD PT24
21.0000 mg | MEDICATED_PATCH | Freq: Every day | TRANSDERMAL | Status: DC
  Administered 2023-12-29 – 2023-12-30 (×2): 21 mg via TRANSDERMAL
  Filled 2023-12-29 (×2): qty 1

## 2023-12-29 MED ORDER — METHOCARBAMOL 500 MG PO TABS
500.0000 mg | ORAL_TABLET | Freq: Three times a day (TID) | ORAL | Status: DC | PRN
  Administered 2023-12-30 (×2): 500 mg via ORAL
  Filled 2023-12-29 (×2): qty 1

## 2023-12-29 MED ORDER — LACTATED RINGERS IV BOLUS
1000.0000 mL | Freq: Once | INTRAVENOUS | Status: AC
Start: 2023-12-29 — End: 2023-12-29
  Administered 2023-12-29: 1000 mL via INTRAVENOUS

## 2023-12-29 MED ORDER — ONDANSETRON HCL 4 MG/2ML IJ SOLN: 4.0000 mg | Freq: Three times a day (TID) | INTRAMUSCULAR | Status: DC | PRN

## 2023-12-29 MED ORDER — INSULIN ASPART 100 UNIT/ML IJ SOLN
0.0000 [IU] | Freq: Every day | INTRAMUSCULAR | Status: DC
  Filled 2023-12-29: qty 1

## 2023-12-29 MED ORDER — SODIUM CHLORIDE 0.9 % IV SOLN
500.0000 mg | Freq: Once | INTRAVENOUS | Status: AC
  Administered 2023-12-29: 500 mg via INTRAVENOUS
  Filled 2023-12-29: qty 5

## 2023-12-29 MED ORDER — INSULIN ASPART 100 UNIT/ML IJ SOLN
0.0000 [IU] | Freq: Three times a day (TID) | INTRAMUSCULAR | Status: DC
  Administered 2023-12-30: 3 [IU] via SUBCUTANEOUS
  Administered 2023-12-30: 2 [IU] via SUBCUTANEOUS
  Filled 2023-12-29: qty 1

## 2023-12-29 MED ORDER — OXYCODONE-ACETAMINOPHEN 5-325 MG PO TABS: 1.0000 | ORAL_TABLET | ORAL | Status: DC | PRN

## 2023-12-29 MED ORDER — ENOXAPARIN SODIUM 30 MG/0.3ML IJ SOSY
30.0000 mg | PREFILLED_SYRINGE | INTRAMUSCULAR | Status: DC
  Administered 2023-12-29: 30 mg via SUBCUTANEOUS
  Filled 2023-12-29: qty 0.3

## 2023-12-29 MED ORDER — LIDOCAINE 5 % EX PTCH
1.0000 | MEDICATED_PATCH | CUTANEOUS | Status: DC
  Administered 2023-12-29: 1 via TRANSDERMAL
  Filled 2023-12-29: qty 1

## 2023-12-29 MED ORDER — SODIUM CHLORIDE 0.9 % IV SOLN: 500.0000 mg | INTRAVENOUS | Status: DC

## 2023-12-29 NOTE — ED Provider Notes (Signed)
 [facility] Provider Note    Event Date/Time   First MD Initiated Contact with Patient 12/29/23 1645     (approximate)   History   Chest Pain   HPI  [name] is a 63 y.o. female who presents to the emergency department today with multiple complaints.  Does complain of some chest pain.  However also complaining of left arm pain, tongue tingling and continued pain to her pelvis.  She was seen in the emergency department yesterday and diagnosed with a pelvic fracture.  Apparently admission was offered however patient declined.    Physical Exam   Triage Vital Signs: ED Triage Vitals  Encounter Vitals Group     BP 12/29/23 1627 (!) 66/46     Girls Systolic BP Percentile --      Girls Diastolic BP Percentile --      Boys Systolic BP Percentile --      Boys Diastolic BP Percentile --      Pulse Rate 12/29/23 1627 70     Resp 12/29/23 1627 20     Temp 12/29/23 1627 97.8 F (36.6 C)     Temp Source 12/29/23 1627 Oral     SpO2 12/29/23 1627 95 %     Weight 12/29/23 1625 87 lb (39.5 kg)     Height 12/29/23 1625 4' 11 (1.499 m)     Head Circumference --      Peak Flow --      Pain Score 12/29/23 1625 10     Pain Loc --      Pain Education --      Exclude from Growth Chart --     Most recent vital signs: Vitals:   12/29/23 1655 12/29/23 1700  BP: (!) 85/55 (!) 89/62  Pulse: 70 70  Resp: 18 17  Temp:    SpO2: 93% 92%   General: Somnolent, awakens to verbal stimuli. CV:  Good peripheral perfusion. Lungs clear. Resp:  Normal effort. Regular rate and rhythm. Abd:  No distention.   ED Results / Procedures / Treatments   Labs (all labs ordered are listed, but only abnormal results are displayed) Labs Reviewed  CBC - Abnormal; Notable for the following components:      Result Value   WBC 12.0 (*)    All other components within normal limits  BASIC METABOLIC PANEL WITH GFR - Abnormal; Notable for the following components:   CO2 21  (*)    Glucose, Bld 183 (*)    Creatinine, Ser 1.43 (*)    Calcium  8.1 (*)    GFR, Estimated 41 (*)    All other components within normal limits  LACTIC ACID, PLASMA - Abnormal; Notable for the following components:   Lactic Acid, Venous 2.3 (*)    All other components within normal limits  URINALYSIS, ROUTINE W REFLEX MICROSCOPIC - Abnormal; Notable for the following components:   Color, Urine YELLOW (*)    APPearance CLEAR (*)    Specific Gravity, Urine 1.033 (*)    Glucose, UA >=500 (*)    Bacteria, UA RARE (*)    All other components within normal limits  BRAIN NATRIURETIC PEPTIDE - Abnormal; Notable for the following components:   B Natriuretic Peptide 169.1 (*)    All other components within normal limits  CULTURE, BLOOD (ROUTINE X 2)  CULTURE, BLOOD (ROUTINE X 2)  RESP PANEL BY RT-PCR (RSV, FLU A&B, COVID)  RVPGX2  EXPECTORATED SPUTUM ASSESSMENT W GRAM STAIN, RFLX  TO RESP C  LACTIC ACID, PLASMA  URINE DRUG SCREEN, QUALITATIVE ([facility] ONLY)  HIV ANTIBODY (ROUTINE TESTING W REFLEX)  STREP PNEUMONIAE URINARY ANTIGEN  LEGIONELLA PNEUMOPHILA SEROGP 1 UR AG  BASIC METABOLIC PANEL WITH GFR  CBC  TROPONIN I (HIGH SENSITIVITY)  TROPONIN I (HIGH SENSITIVITY)     EKG  I, Guadalupe Eagles, attending physician, personally viewed and interpreted this EKG  EKG Time: 1634 Rate: 66 Rhythm: normal sinus rhythm Axis: left axis deviation Intervals: qtc 442 QRS: narrow, low voltage, LAFB ST changes: no st elevation Impression: abnormal ekg   RADIOLOGY I independently interpreted and visualized the CXR. My interpretation: No pneumonia Radiology interpretation:  IMPRESSION:  1. No active disease.  2. Aortic Atherosclerosis (ICD10-I70.0) and Emphysema (ICD10-J43.9).    I independently interpreted and visualized the CT angio dissection. My interpretation: No dissection, right sided pneumonia. Radiology interpretation:  IMPRESSION:  1. Normal contour and caliber of the thoracic  and abdominal aorta.  No evidence of aneurysm, dissection, or other acute aortic  pathology. Moderate abdominal aortic atherosclerosis.  2. Cardiomegaly. Marked prominence of the main pulmonary artery  relative to the aorta, caliber 3.2 cm, as well as prominence and  tortuosity of the pulmonary arteries. Findings are suggestive of  pulmonary hypertension.  3. Irregular heterogeneous and ground-glass airspace opacity  throughout the dependent lungs, consistent with infection or  aspiration.  4. Emphysema and diffuse bilateral bronchial wall thickening.  5. Subacute or chronic nondisplaced fracture of the left inferior  pubic ramus. Correlate for referable trauma.    Aortic Atherosclerosis (ICD10-I70.0) and Emphysema (ICD10-J43.9).     PROCEDURES:  Critical Care performed: No   MEDICATIONS ORDERED IN ED: Medications  sodium chloride  0.9 % bolus 1,000 mL (1,000 mLs Intravenous New Bag/Given 12/29/23 1644)     IMPRESSION / MDM / ASSESSMENT AND PLAN / ED COURSE  I reviewed the triage vital signs and the nursing notes.                              Differential diagnosis includes, but is not limited to, dehydration, infection, drug overdose, bleed  Patient's presentation is most consistent with acute presentation with potential threat to life or bodily function.   The patient is on the cardiac monitor to evaluate for evidence of arrhythmia and/or significant heart rate changes.  Patient presented to the emergency department today because of concern for multiple complaints including chest pain and family noticed patient was more sleepy than normal.  Patient was hypotensive here and is somnolent.  Patient was seen yesterday for a pelvic fracture.  Blood work shows elevated creatinine and concern for dehydration.  Will start IV fluids.  Blood work with very mild leukocytosis but no lactic acidosis.  Given concern for possible intra-abdominal bleed or dissection will obtain CT.  CT  scan continues to show the fracture.  No aortic concern.  Did raise concern for pneumonia.  I do wonder if patient might of aspirated given decreased mobility secondary to the fracture.  Will plan on starting IV antibiotics.  Patient's blood pressure did improve significantly with IV fluid bolus.  Discussed with Dr. Hilma with the hospitalist service who will evaluate for admission.      FINAL CLINICAL IMPRESSION(S) / ED DIAGNOSES   Final diagnoses:  Pneumonia due to infectious organism, unspecified laterality, unspecified part of lung  Hypotension, unspecified hypotension type         Note:  This document  was prepared using Conservation officer, historic buildings and may include unintentional dictation errors.    Floy Roberts, MD 12/29/23 2150

## 2023-12-29 NOTE — ED Notes (Signed)
 MD notify of low blood pressure 79/63

## 2023-12-29 NOTE — ED Notes (Signed)
 Pt taken to room 10 and fluids started.

## 2023-12-29 NOTE — ED Notes (Signed)
 Pt to room 10 from triage.

## 2023-12-29 NOTE — ED Triage Notes (Addendum)
 Pt to triage via wheelchair.  Pt has chest pain, dizziness and numbness in left arm.  Sx began today.  Pt has nausea.  Pt reports sob also.  Pt alert.    Pt was seen here yesterday with FX PELVIS.

## 2023-12-29 NOTE — Progress Notes (Signed)
 PHARMACIST - PHYSICIAN COMMUNICATION  CONCERNING:  Enoxaparin  (Lovenox ) for DVT Prophylaxis    RECOMMENDATION: Patient was prescribed enoxaprin 40mg  q24 hours for VTE prophylaxis.   Filed Weights   12/29/23 1625  Weight: 39.5 kg (87 lb)    Body mass index is 17.57 kg/m.  Estimated Creatinine Clearance: 25.1 mL/min (A) (by C-G formula based on SCr of 1.43 mg/dL (H)).   Based on [facility] policy patient is candidate for enoxaparin  30mg  every 24 hours based on CrCl <66ml/min and Weight <45kg  DESCRIPTION: Pharmacy has adjusted enoxaparin  dose per [facility] policy.  Patient is now receiving enoxaparin  30 mg every 24 hours    Adriana JONETTA Bolster, PharmD Clinical Pharmacist  12/29/2023 8:58 PM

## 2023-12-29 NOTE — H&P (Incomplete)
 History and Physical    [name] FMW:[id number] DOB: March 30, 1960 DOA: 12/29/2023  Referring MD/NP/PA:   PCP: Clora Marlee RODES, MD   Patient coming from:  The patient is coming from home.     Chief Complaint: chest pain, SOB, lethargy  HPI: [name] is a 63 y.o. female with medical history significant of hypertension, hyperlipidemia, diabetes mellitus, asthma, GERD, depression, anxiety, OSA not on CPAP, fibromyalgia, tobacco abuse, cocaine abuse, migraine headache, who presented with shortness of breath, rectal bleeding, pelvic fracture, who presents with chest pain, SOB, lethargy.  Pt was seen in ED yesterday (10/5) due to fall and left hip pain. X-ray showed acute nondisplaced fracture of the left inferior pubic ramus.  Patient had negative CT scan of head and neck for acute injury. Pt was started on Norco for pain control. Pt becomes lethargic.  She is arousable when I saw patient in ED. When aroused, she is oriented x 3.  She answered all questions appropriately.  She states that she has chest pain and SOB.  She has mild dry cough.  No fever or chills.  Chest pain is located in the front chest, sharp, mild to moderate, nonradiating, pleuritic, aggravated by deep breath.  Patient states that she has chronic intermittent nausea, vomiting, diarrhea for a long time which has not changed.  Currently no abdominal pain.  She also reports intermittent dysuria and burning on urination.  No hematuria.  She still has left hip pain.  Patient stated she has intermittent alternative tingling in both arms, but no tingling and weakness in the arms currently.  No numbness or tingling in legs.  No facial droop or slurred speech.  Patient was initially hypotensive with blood pressure 66/46 which improved to SBP of 90s after giving 2 L IV fluid in ED.  Data reviewed independently and ED Course: pt was found to have WBC 12.0, lactic acid 1.8, troponin 7 --> 9, AKI.  Temperature normal, heart  rate 106 76, RR 24--->  > 12, oxygen saturation 95% on room air.  Chest x-ray negative.  Patient is placed in telemetry bed observation.   CT of the chest/abdomen/pelvis: 1. Normal contour and caliber of the thoracic and abdominal aorta. No evidence of aneurysm, dissection, or other acute aortic pathology. Moderate abdominal aortic atherosclerosis. 2. Cardiomegaly. Marked prominence of the main pulmonary artery relative to the aorta, caliber 3.2 cm, as well as prominence and tortuosity of the pulmonary arteries. Findings are suggestive of pulmonary hypertension. 3. Irregular heterogeneous and ground-glass airspace opacity throughout the dependent lungs, consistent with infection or aspiration. 4. Emphysema and diffuse bilateral bronchial wall thickening. 5. Subacute or chronic nondisplaced fracture of the left inferior pubic ramus. Correlate for referable trauma.   Aortic Atherosclerosis (ICD10-I70.0) and Emphysema (ICD10-J43.9).     EKG: I have personally reviewed.  Send sinus rhythm, bilateral atrial enlargement, LAD, poor R wave progression.   Review of Systems:   General: no fevers, chills, no body weight gain, has poor appetite, has fatigue HEENT: no blurry vision, hearing changes or sore throat Respiratory: has  dyspnea, coughing, no wheezing CV: has chest pain, no palpitations GI: has nausea, vomiting, abdominal pain, diarrhea GU: no dysuria, burning on urination, increased urinary frequency, hematuria  Ext: no leg edema Neuro: no unilateral weakness, no vision change or hearing loss. Has lethargy Skin: no rash, no skin tear. MSK: No muscle spasm, no deformity, no limitation of range of movement in spin Heme: No easy bruising.  Travel history: No  recent long distant travel.   Allergy:  Allergies  Allergen Reactions   Tramadol Itching    Headaches and confusion.   Benadryl [Diphenhydramine Hcl] Itching   Bextra [Valdecoxib] Itching   Invokana [Canagliflozin]  Other (See Comments)    Causes yeast infection   Sulfamethoxazole-Trimethoprim Other (See Comments)    Makes me feel weird    Past Medical History:  Diagnosis Date   Abnormal CBC    Asthma    Asthma    BPPV (benign paroxysmal positional vertigo)    none recently   Cardiac murmur, unspecified 08/28/2016   COPD (chronic obstructive pulmonary disease) (HCC)    CPAP (continuous positive airway pressure) dependence    Degenerative disc disease, lumbar    Depression    Diabetes mellitus    Diverticulitis 10/2016   Elevated LFTs    Fatty liver    Fibromyalgia    Fibromyalgia    GERD (gastroesophageal reflux disease)    Headache    migraines.  None since starting topamax    History of degenerative disc disease    Hyperlipidemia    Hypertension    Sleep apnea    CPAP    Past Surgical History:  Procedure Laterality Date   ABDOMINAL HYSTERECTOMY     ABDOMINAL SURGERY     CATARACT EXTRACTION W/PHACO Left 09/07/2019   Procedure: CATARACT EXTRACTION PHACO AND INTRAOCULAR LENS PLACEMENT (IOC) LEFT DIABETIC;  Surgeon: Jaye Fallow, MD;  Location: [facility];  Service: Ophthalmology;  Laterality: Left;  4.01 0:43.8   CATARACT EXTRACTION W/PHACO Right 09/28/2019   Procedure: CATARACT EXTRACTION PHACO AND INTRAOCULAR LENS PLACEMENT (IOC) RIGHT DIABETIC 4.16  00:43.3;  Surgeon: Jaye Fallow, MD;  Location: [facility];  Service: Ophthalmology;  Laterality: Right;  Diabetic - insulin  and oral meds   CHOLECYSTECTOMY N/A 04/27/2018   Procedure: LAPAROSCOPIC CHOLECYSTECTOMY;  Surgeon: Rodolph Romano, MD;  Location: [facility];  Service: General;  Laterality: N/A;   COLONOSCOPY WITH PROPOFOL  N/A 02/20/2017   Procedure: COLONOSCOPY WITH PROPOFOL ;  Surgeon: Gaylyn Gladis PENNER, MD;  Location: [facility];  Service: Endoscopy;  Laterality: N/A;   Diverticulitis     ESOPHAGOGASTRODUODENOSCOPY (EGD) WITH PROPOFOL  N/A 02/20/2017   Procedure: ESOPHAGOGASTRODUODENOSCOPY  (EGD) WITH PROPOFOL ;  Surgeon: Gaylyn Gladis PENNER, MD;  Location: [facility] END[facility]COPY;  Service: Endoscopy;  Laterality: N/A;   EUS N/A 01/08/2018   Procedure: UPPER ENDOSCOPIC ULTRASOUND (EUS) RADIAL;  Surgeon: Queenie Asberry LABOR, MD;  Location: [facility] END[facility];  Service: Gastroenterology;  Laterality: N/A;   RIGHT/LEFT HEART CATH AND CORONARY ANGIOGRAPHY Bilateral 12/19/2021   Procedure: RIGHT/LEFT HEART CATH AND CORONARY ANGIOGRAPHY;  Surgeon: Florencio Cara BIRCH, MD;  Location: [facility];  Service: Cardiovascular;  Laterality: Bilateral;   UPPER ESOPHAGEAL ENDOSCOPIC ULTRASOUND (EUS) N/A 04/03/2017   Procedure: UPPER ESOPHAGEAL ENDOSCOPIC ULTRASOUND (EUS);  Surgeon: Queenie Asberry LABOR, MD;  Location: [facility] ENDOSCOPY;  Service: Gastroenterology;  Laterality: N/A;    Social History:  reports that she has been smoking cigarettes. She has a 36 pack-year smoking history. She has been exposed to tobacco smoke. She has quit using smokeless tobacco. She reports that she does not currently use alcohol after a past usage of about 2.0 standard drinks of alcohol per week. She reports current drug use. Drug: Cocaine.  Family History:  Family History  Problem Relation Age of Onset   Diabetes Mother    Hypertension Mother      Prior to Admission medications   Medication Sig Start Date End Date Taking? Authorizing Provider  ACCU-CHEK GUIDE test strip USE TID 12/30/17   [provider]  albuterol  (PROVENTIL  HFA;VENTOLIN  HFA) 108 (90 Base) MCG/ACT inhaler Inhale 1 puff into the lungs 2 (two) times daily as needed.    [provider]  aspirin  81 MG tablet Take 81 mg by mouth daily.     [provider]  Blood Glucose Monitoring Suppl (ACCU-CHEK GUIDE) w/Device KIT USE TO TEST BLOOD SUGAR THREE TIMES DAILY OR AS DIRECTED BY YOUR DOCTOR 08/14/22   [provider]  cephALEXin  (KEFLEX ) 500 MG capsule Take 1 capsule (500 mg total) by mouth 3 (three) times daily. 12/11/23    Goodman, Graydon, MD  cetirizine (ZYRTEC) 10 MG tablet Take 10 mg by mouth at bedtime. 04/28/20   [provider]  docusate sodium (COLACE) 100 MG capsule Take 100 mg by mouth 2 (two) times daily.    [provider]  famotidine  (PEPCID ) 40 MG tablet Take 20 mg by mouth daily.    [provider]  HYDROcodone -acetaminophen  (NORCO) 7.5-325 MG tablet Take 1 tablet by mouth every 6 (six) hours as needed for up to 5 days for severe pain (pain score 7-10). Must last 30 days 02/20/24 02/25/24  Arlander Charleston, MD  LINZESS  290 MCG CAPS capsule Take 290 mcg by mouth daily. 04/12/21   [provider]  meclizine (ANTIVERT) 25 MG tablet Take 25 mg by mouth 2 (two) times daily as needed for dizziness or nausea. 05/31/21   [provider]  metFORMIN (GLUCOPHAGE) 1000 MG tablet Take 1,000 mg by mouth 2 (two) times daily.  06/07/16   [provider]  mirtazapine  (REMERON ) 45 MG tablet Take 45 mg by mouth at bedtime. 04/14/18   [provider]  omeprazole (PRILOSEC) 20 MG capsule Take 20 mg by mouth daily. 06/13/22   [provider]  ondansetron  (ZOFRAN ) 4 MG tablet Take 4 mg by mouth every 8 (eight) hours as needed for nausea or vomiting. 12/30/17   [provider]  pregabalin  (LYRICA ) 100 MG capsule Take 1 capsule (100 mg total) by mouth 3 (three) times daily. 06/23/23   Marcelino Nurse, MD  propranolol  (INDERAL ) 40 MG tablet Take 40 mg by mouth 2 (two) times daily.    [provider]  rizatriptan (MAXALT) 10 MG tablet Take 10 mg by mouth as needed for migraine. May repeat in 2 hours if needed    [provider]  rosuvastatin  (CRESTOR ) 20 MG tablet Take 20 mg by mouth daily.    [provider]  topiramate  (TOPAMAX ) 25 MG tablet Take 1 tablet (25 mg total) by mouth 2 (two) times daily. 03/14/22   Marcelino Nurse, MD  traZODone  (DESYREL ) 150 MG tablet Take 150 mg by mouth at bedtime. 04/27/20   [provider]   venlafaxine  XR (EFFEXOR -XR) 150 MG 24 hr capsule Take 150 mg by mouth daily. 07/15/19   [provider]    Physical Exam: Vitals:   12/29/23 2000 12/29/23 2015 12/29/23 2045 12/29/23 2212  BP: (!) 124/91 114/70  (!) 148/80  Pulse: 77 76  61  Resp: 12 12  16   Temp:   97.9 F (36.6 C) 98.5 F (36.9 C)  TempSrc:   Oral   SpO2: 97% 95%  100%  Weight:      Height:       General: Not in acute distress.  Thin body habitus, dry mucous membrane HEENT:       Eyes: PERRL, EOMI, no jaundice  ENT: No discharge from the ears and nose, no pharynx injection, no tonsillar enlargement.        Neck: No JVD, no bruit, no mass felt. Heme: No neck lymph node enlargement. Cardiac: S1/S2, RRR, No gallops or rubs. Respiratory: No rales, wheezing, rhonchi or rubs. GI: Soft, nondistended, nontender, no rebound pain, no organomegaly, BS present. GU: No hematuria Ext: No pitting leg edema bilaterally. 1+DP/PT pulse bilaterally. Musculoskeletal: No joint deformities, No joint redness or warmth, no limitation of ROM in spin. Skin: No rashes.  Neuro: Patient is lethargic, arousable, oriented X3, cranial nerves II-XII grossly intact, moves all extremities   Psych: Patient is not psychotic, no suicidal or hemocidal ideation.  Labs on Admission: I have personally reviewed following labs and imaging studies  CBC: Recent Labs  Lab 12/28/23 1236 12/29/23 1629  WBC 10.8* 12.0*  HGB 13.4 13.1  HCT 41.2 42.5  MCV 87.3 93.0  PLT 175 159   Basic Metabolic Panel: Recent Labs  Lab 12/28/23 1236 12/29/23 1752  NA 137 138  K 4.0 4.2  CL 100 105  CO2 25 21*  GLUCOSE 440* 183*  BUN 17 17  CREATININE 0.70 1.43*  CALCIUM  8.9 8.1*   GFR: Estimated Creatinine Clearance: 25.1 mL/min (A) (by C-G formula based on SCr of 1.43 mg/dL (H)). Liver Function Tests: Recent Labs  Lab 12/28/23 1236  AST 31  ALT 36  ALKPHOS 70  BILITOT 0.8  PROT 6.4*  ALBUMIN 3.4*   No results for input(s):  LIPASE, AMYLASE in the last 168 hours. No results for input(s): AMMONIA in the last 168 hours. Coagulation Profile: No results for input(s): INR, PROTIME in the last 168 hours. Cardiac Enzymes: No results for input(s): CKTOTAL, CKMB, CKMBINDEX, TROPONINI in the last 168 hours. BNP (last 3 results) No results for input(s): PROBNP in the last 8760 hours. HbA1C: No results for input(s): HGBA1C in the last 72 hours. CBG: Recent Labs  Lab 12/29/23 2216  GLUCAP 152*   Lipid Profile: No results for input(s): CHOL, HDL, LDLCALC, TRIG, CHOLHDL, LDLDIRECT in the last 72 hours. Thyroid Function Tests: No results for input(s): TSH, T4TOTAL, FREET4, T3FREE, THYROIDAB in the last 72 hours. Anemia Panel: No results for input(s): VITAMINB12, FOLATE, FERRITIN, TIBC, IRON, RETICCTPCT in the last 72 hours. Urine analysis:    Component Value Date/Time   COLORURINE YELLOW (A) 12/29/2023 2052   APPEARANCEUR CLEAR (A) 12/29/2023 2052   APPEARANCEUR Clear 05/31/2015 1229   LABSPEC 1.033 (H) 12/29/2023 2052   LABSPEC 1.025 12/03/2013 2207   PHURINE 5.0 12/29/2023 2052   GLUCOSEU >=500 (A) 12/29/2023 2052   GLUCOSEU >=500 12/03/2013 2207   HGBUR NEGATIVE 12/29/2023 2052   BILIRUBINUR NEGATIVE 12/29/2023 2052   BILIRUBINUR Negative 05/31/2015 1229   BILIRUBINUR Negative 12/03/2013 2207   KETONESUR NEGATIVE 12/29/2023 2052   PROTEINUR NEGATIVE 12/29/2023 2052   UROBILINOGEN 0.2 05/11/2007 0952   NITRITE NEGATIVE 12/29/2023 2052   LEUKOCYTESUR NEGATIVE 12/29/2023 2052   LEUKOCYTESUR Negative 12/03/2013 2207   Sepsis Labs: @LABRCNTIP (procalcitonin:4,lacticidven:4) ) Recent Results (from the past 240 hours)  Resp panel by RT-PCR (RSV, Flu A&B, Covid) Anterior Nasal Swab     Status: None   Collection Time: 12/28/23 12:36 PM   Specimen: Anterior Nasal Swab  Result Value Ref Range Status   SARS Coronavirus 2 by RT PCR NEGATIVE NEGATIVE Final     Comment: (NOTE) SARS-CoV-2 target nucleic acids are NOT DETECTED.  The SARS-CoV-2 RNA is generally detectable in upper respiratory specimens during the acute  phase of infection. The lowest concentration of SARS-CoV-2 viral copies this assay can detect is 138 copies/mL. A negative result does not preclude SARS-Cov-2 infection and should not be used as the sole basis for treatment or other patient management decisions. A negative result may occur with  improper specimen collection/handling, submission of specimen other than nasopharyngeal swab, presence of viral mutation(s) within the areas targeted by this assay, and inadequate number of viral copies(<138 copies/mL). A negative result must be combined with clinical observations, patient history, and epidemiological information. The expected result is Negative.  Fact Sheet for Patients:  BloggerCourse.com  Fact Sheet for Healthcare Providers:  SeriousBroker.it  This test is no t yet approved or cleared by the [place]  FDA and  has been authorized for detection and/or diagnosis of SARS-CoV-2 by FDA under an Emergency Use Authorization (EUA). This EUA will remain  in effect (meaning this test can be used) for the duration of the COVID-19 declaration under Section 564(b)(1) of the Act, 21 U.S.C.section 360bbb-3(b)(1), unless the authorization is terminated  or revoked sooner.       Influenza A by PCR NEGATIVE NEGATIVE Final   Influenza B by PCR NEGATIVE NEGATIVE Final    Comment: (NOTE) The Xpert Xpress SARS-CoV-2/FLU/RSV plus assay is intended as an aid in the diagnosis of influenza from Nasopharyngeal swab specimens and should not be used as a sole basis for treatment. Nasal washings and aspirates are unacceptable for Xpert Xpress SARS-CoV-2/FLU/RSV testing.  Fact Sheet for Patients: BloggerCourse.com  Fact Sheet for Healthcare  Providers: SeriousBroker.it  This test is not yet approved or cleared by the [place]  FDA and has been authorized for detection and/or diagnosis of SARS-CoV-2 by FDA under an Emergency Use Authorization (EUA). This EUA will remain in effect (meaning this test can be used) for the duration of the COVID-19 declaration under Section 564(b)(1) of the Act, 21 U.S.C. section 360bbb-3(b)(1), unless the authorization is terminated or revoked.     Resp Syncytial Virus by PCR NEGATIVE NEGATIVE Final    Comment: (NOTE) Fact Sheet for Patients: BloggerCourse.com  Fact Sheet for Healthcare Providers: SeriousBroker.it  This test is not yet approved or cleared by the [place]  FDA and has been authorized for detection and/or diagnosis of SARS-CoV-2 by FDA under an Emergency Use Authorization (EUA). This EUA will remain in effect (meaning this test can be used) for the duration of the COVID-19 declaration under Section 564(b)(1) of the Act, 21 U.S.C. section 360bbb-3(b)(1), unless the authorization is terminated or revoked.  Performed at [facility], [address], [place], [place] [address]      Radiological Exams on Admission:   Assessment/Plan Principal Problem:   Aspiration pneumonia (HCC) Active Problems:   COPD (chronic obstructive pulmonary disease) (HCC)   Essential hypertension   HLD (hyperlipidemia)   Chronic diastolic CHF (congestive heart failure) (HCC)   AKI (acute kidney injury)   Diabetes mellitus without complication (HCC)   Lethargy   Smoker   Chronic pain syndrome   Pelvic fracture (HCC)   Depression with anxiety   Protein-calorie malnutrition, severe   Assessment and Plan:  Aspiration pneumonia [facility]): Patient likely has aspiration pneumonia.  No oxygen desaturation.  Has mild leukocytosis with WBC 12.0, lactic acid normal.  Clinically not septic.  She had  hypotension which responded to IV fluid resuscitation, may be due to dehydration.  - Place in tele bed   for obs - Unasyn (patient received 1 dose of Rocephin  and azithromycin in ED). - Mucinex   for cough  - Bronchodilators - Urine legionella and S. pneumococcal antigen - Follow up blood culture x2, sputum culture - IVF: 2L of NS bolus in ED (patient has CHF, limiting aggressive IV fluids treatment)  COPD (chronic obstructive pulmonary disease) (HCC): No wheezing or rhonchi. -Bronchodilators today.  Mucinex   Essential hypertension -Hold her propranolol  to 2 hypotension - IV hydralazine  as needed  HLD (hyperlipidemia): Patient is not taking Crestor  currently. -Follow-up with PCP  Chronic diastolic CHF (congestive heart failure) (HCC): 2D echo on 07/12/2021 showed EF 50-55% with grade 2 diastolic dysfunction.  Patient is clinically dry.  BNP was 69. -Watch volume status closely  AKI (acute kidney injury): Recent baseline creatinine 0.51.  Her creatinine is 1.43, BUN 17, GFR 41.  Likely due to dehydration -IV fluid as above  Diabetes mellitus without complication [facility]): Recent A1c> 15.5, poorly controlled.  Patient taking metformin -SSI  Lethargy: Likely multifactorial etiology, including ongoing infection, polypharmacy is also possible.  Patient just had negative CT scan of head yesterday. -Frequent neurocheck - Fall precaution - Hold Norco, Lyrica , Effexor , Remeron , trazodone   Smoker -Nicotine  patch  Chronic pain syndrome -As needed Tylenol  -Hold Norco due to lethargy  Recent pelvic fracture (HCC) -Hold Norco as above - As needed Tylenol  - Lidoderm  patch - As needed Robaxin  Depression with anxiety -Hold home medications as above due to lethargy  Protein-calorie malnutrition, severe: Body weight 39.5 kg, BMI 17.57 - Nutrition consult - Ensure      DVT ppx:   SQ Lovenox   Code Status: Full code     Family Communication:     not done, no family member is at bed  side.   Disposition Plan:  Anticipate discharge back to previous environment  Consults called:  none  Admission status and Level of care: Telemetry Medical:    for obs    Dispo: The patient is from: Home              Anticipated d/c is to: Home              Anticipated d/c date is: 1 day              Patient currently is not medically stable to d/c.    Severity of Illness:  The appropriate patient status for this patient is OBSERVATION. Observation status is judged to be reasonable and necessary in order to provide the required intensity of service to ensure the patient's safety. The patient's presenting symptoms, physical exam findings, and initial radiographic and laboratory data in the context of their medical condition is felt to place them at decreased risk for further clinical deterioration. Furthermore, it is anticipated that the patient will be medically stable for discharge from the hospital within 2 midnights of admission.        Date of Service 12/30/2023    [facility]   If 7PM-7AM, please contact night-coverage [email or web address] 12/30/2023, 1:22 AM

## 2023-12-29 NOTE — ED Notes (Signed)
 Date and time results received: 12/29/23 2137 (use smartphrase .now to insert current time)  Test: Lactic acid Critical Value: 2.3 Name of Provide Notified: Hilma  Orders Received? Or Actions Taken?: none here. Pt has a ready bed

## 2023-12-30 DIAGNOSIS — J690 Pneumonitis due to inhalation of food and vomit: Secondary | ICD-10-CM | POA: Diagnosis not present

## 2023-12-30 DIAGNOSIS — S32609D Unspecified fracture of unspecified ischium, subsequent encounter for fracture with routine healing: Secondary | ICD-10-CM

## 2023-12-30 DIAGNOSIS — R5383 Other fatigue: Secondary | ICD-10-CM

## 2023-12-30 DIAGNOSIS — I10 Essential (primary) hypertension: Secondary | ICD-10-CM | POA: Diagnosis not present

## 2023-12-30 DIAGNOSIS — J449 Chronic obstructive pulmonary disease, unspecified: Secondary | ICD-10-CM | POA: Diagnosis not present

## 2023-12-30 LAB — GLUCOSE, CAPILLARY
Glucose-Capillary: 165 mg/dL — ABNORMAL HIGH (ref 70–99)
Glucose-Capillary: 249 mg/dL — ABNORMAL HIGH (ref 70–99)

## 2023-12-30 LAB — CBC
HCT: 36.1 % (ref 36.0–46.0)
Hemoglobin: 11.8 g/dL — ABNORMAL LOW (ref 12.0–15.0)
MCH: 28.4 pg (ref 26.0–34.0)
MCHC: 32.7 g/dL (ref 30.0–36.0)
MCV: 86.8 fL (ref 80.0–100.0)
Platelets: 151 K/uL (ref 150–400)
RBC: 4.16 MIL/uL (ref 3.87–5.11)
RDW: 13.5 % (ref 11.5–15.5)
WBC: 9.1 K/uL (ref 4.0–10.5)
nRBC: 0 % (ref 0.0–0.2)

## 2023-12-30 LAB — BASIC METABOLIC PANEL WITH GFR
Anion gap: 6 (ref 5–15)
BUN: 22 mg/dL (ref 8–23)
CO2: 24 mmol/L (ref 22–32)
Calcium: 8.2 mg/dL — ABNORMAL LOW (ref 8.9–10.3)
Chloride: 110 mmol/L (ref 98–111)
Creatinine, Ser: 0.84 mg/dL (ref 0.44–1.00)
GFR, Estimated: >60 mL/min (ref >60)
Glucose, Bld: 230 mg/dL — ABNORMAL HIGH (ref 70–99)
Potassium: 4.2 mmol/L (ref 3.5–5.1)
Sodium: 140 mmol/L (ref 135–145)

## 2023-12-30 LAB — HIV ANTIBODY (ROUTINE TESTING W REFLEX): HIV Screen 4th Generation wRfx: NONREACTIVE

## 2023-12-30 MED ORDER — PROPRANOLOL HCL 10 MG PO TABS
40.0000 mg | ORAL_TABLET | Freq: Two times a day (BID) | ORAL | Status: DC
Start: 2023-12-30 — End: 2023-12-30
  Administered 2023-12-30: 40 mg via ORAL
  Filled 2023-12-30: qty 4

## 2023-12-30 MED ORDER — LINACLOTIDE 145 MCG PO CAPS
290.0000 ug | ORAL_CAPSULE | Freq: Every day | ORAL | Status: DC
  Filled 2023-12-30: qty 2

## 2023-12-30 MED ORDER — DM-GUAIFENESIN ER 30-600 MG PO TB12: 1.0000 | ORAL_TABLET | Freq: Two times a day (BID) | ORAL | 0 refills | Status: DC | PRN

## 2023-12-30 MED ORDER — SODIUM CHLORIDE 0.9 % IV SOLN
3.0000 g | Freq: Four times a day (QID) | INTRAVENOUS | Status: DC
  Administered 2023-12-30: 3 g via INTRAVENOUS
  Filled 2023-12-30 (×2): qty 8

## 2023-12-30 MED ORDER — LIDOCAINE 5 % EX PTCH: 1.0000 | MEDICATED_PATCH | CUTANEOUS | 0 refills | Status: DC

## 2023-12-30 MED ORDER — AMOXICILLIN-POT CLAVULANATE 875-125 MG PO TABS: 1.0000 | ORAL_TABLET | Freq: Two times a day (BID) | ORAL | 0 refills | Status: AC

## 2023-12-30 MED ORDER — PANTOPRAZOLE SODIUM 40 MG PO TBEC
40.0000 mg | DELAYED_RELEASE_TABLET | Freq: Every day | ORAL | Status: DC
  Administered 2023-12-30: 40 mg via ORAL
  Filled 2023-12-30: qty 1

## 2023-12-30 MED ORDER — ASPIRIN 81 MG PO TBEC
81.0000 mg | DELAYED_RELEASE_TABLET | Freq: Every day | ORAL | Status: DC
  Administered 2023-12-30: 81 mg via ORAL
  Filled 2023-12-30: qty 1

## 2023-12-30 MED ORDER — MIRTAZAPINE 15 MG PO TABS: 15.0000 mg | ORAL_TABLET | Freq: Every day | ORAL | Status: DC

## 2023-12-30 MED ORDER — FAMOTIDINE 20 MG PO TABS
20.0000 mg | ORAL_TABLET | Freq: Every day | ORAL | Status: DC
  Administered 2023-12-30: 20 mg via ORAL
  Filled 2023-12-30: qty 1

## 2023-12-30 MED ORDER — ENSURE PLUS HIGH PROTEIN PO LIQD: 237.0000 mL | Freq: Two times a day (BID) | ORAL | 2 refills | Status: AC

## 2023-12-30 MED ORDER — MIRTAZAPINE 15 MG PO TABS: 45.0000 mg | ORAL_TABLET | Freq: Every day | ORAL | Status: DC

## 2023-12-30 NOTE — Evaluation (Signed)
 Physical Therapy Evaluation Patient Details Name: [name] MRN: [id number] DOB: 14-May-1960 Today's Date: 12/30/2023  History of Present Illness  Pt is a 63 y.o. female who presented with shortness of breath, rectal bleeding, pelvic fracture, who presents with chest pain, SOB, lethargy. Imaging shows acute nondisplaced fracture of the left inferior pubic ramus. MD assessment: Aspiration pneumoni, COPD. PMH of hypertension, hyperlipidemia, diabetes mellitus, asthma, GERD, depression, anxiety, OSA not on CPAP, fibromyalgia, tobacco abuse, cocaine abuse, migraine headache.  Clinical Impression  Patient seated in recliner upon arrival to room; alert and oriented, follows commands and agreeable to participation with session.  Endorses L hip pain 8/10; meds received per RN prior to session. Bilat UE/LE strength and ROM grossly WFL for basic transfers and mobility; expected guarding, limitations in L LE due to pain.  Currently requiring cga/min assist for sit/stand, basic transfers and gait (50') with RW.  Demonstrates 3-point, step to gait pattern with decreased WBing/stance time L LE (limited by pain); occasional vaulting to assist with L LE advancement. Consistent verbal cuing for technique with initial mobilization, but quickly integrates cuing with repetition. Minimal change in pain rating with WBing/mobility efforts; do recommend continued use of RW for stability and pain control as needed.  Patient voiced agreement/understanding. Of note, sats >95% on RA throughout session. Would benefit from skilled PT to address above deficits and promote optimal return to PLOF.; recommend post-acute PT follow up as indicated by interdisciplinary care team.          If plan is discharge home, recommend the following: A little help with walking and/or transfers;A little help with bathing/dressing/bathroom   Can travel by private vehicle        Equipment Recommendations Rolling walker (2  wheels);BSC/3in1  Recommendations for Other Services       Functional Status Assessment Patient has had a recent decline in their functional status and demonstrates the ability to make significant improvements in function in a reasonable and predictable amount of time.     Precautions / Restrictions Precautions Precautions: Fall Restrictions Weight Bearing Restrictions Per Provider Order: No Other Position/Activity Restrictions: WBAT LLE pubic ramus fx      Mobility  Bed Mobility               General bed mobility comments: seated in recliner beginning/end of treatment session    Transfers Overall transfer level: Needs assistance Equipment used: Rolling walker (2 wheels) Transfers: Sit to/from Stand, Bed to chair/wheelchair/BSC Sit to Stand: Contact guard assist, Min assist   Step pivot transfers: Contact guard assist, Min assist            Ambulation/Gait Ambulation/Gait assistance: Contact guard assist, Supervision Gait Distance (Feet): 50 Feet Assistive device: Rolling walker (2 wheels)         General Gait Details: 3-point, step to gait pattern with decreased WBing/stance time L LE (limited by pain).  Consistent verbal cuing for technique with initial mobilization, but quickly integrates cuing with repetition.  Minimal change in pain rating with WBing/mobility efforts; do recommend continued use of RW for stability and pain control as needed  Stairs            Wheelchair Mobility     Tilt Bed    Modified Rankin (Stroke Patients Only)       Balance Overall balance assessment: Needs assistance Sitting-balance support: No upper extremity supported, Feet supported Sitting balance-Leahy Scale: Good     Standing balance support: Bilateral upper extremity supported Standing balance-Leahy Scale: Fair  Pertinent Vitals/Pain Pain Assessment Pain Assessment: 0-10 Pain Score: 8  Pain Location: L  hip Pain Descriptors / Indicators: Aching, Sore Pain Intervention(s): Limited activity within patient's tolerance, Monitored during session, Repositioned    Home Living Family/patient expects to be discharged to:: Private residence Living Arrangements: Alone Available Help at Discharge: Family;Available PRN/intermittently Type of Home: Apartment Home Access: Level entry       Home Layout: One level   Additional Comments: Reports plans to discharge to daughter's home for a couple weeks upon discharge    Prior Function Prior Level of Function : Independent/Modified Independent             Mobility Comments: no AD use at baseline; reports multiple falls in the last 6 months       Extremity/Trunk Assessment   Upper Extremity Assessment Upper Extremity Assessment: Overall WFL for tasks assessed    Lower Extremity Assessment Lower Extremity Assessment: Generalized weakness (grossly at least 4+/5 R LE, 4-/5 L LE (limited by pain))       Communication   Communication Communication: No apparent difficulties    Cognition Arousal: Alert Behavior During Therapy: WFL for tasks assessed/performed   PT - Cognitive impairments: No apparent impairments                         Following commands: Intact       Cueing Cueing Techniques: Verbal cues     General Comments      Exercises Other Exercises Other Exercises: Toilet transfer, SPT with RW, min assist; generally impuslive, limited active use of L LE   Assessment/Plan    PT Assessment Patient needs continued PT services  PT Problem List Decreased strength;Decreased range of motion;Decreased activity tolerance;Decreased balance;Decreased mobility;Decreased knowledge of use of DME;Decreased safety awareness;Decreased knowledge of precautions       PT Treatment Interventions DME instruction;Gait training;Functional mobility training;Therapeutic activities;Balance training;Patient/family  education;Therapeutic exercise    PT Goals (Current goals can be found in the Care Plan section)  Acute Rehab PT Goals Patient Stated Goal: I just want to go home PT Goal Formulation: With patient Time For Goal Achievement: 01/13/24 Potential to Achieve Goals: Good    Frequency Min 3X/week     Co-evaluation               AM-PAC PT 6 Clicks Mobility  Outcome Measure Help needed turning from your back to your side while in a flat bed without using bedrails?: None Help needed moving from lying on your back to sitting on the side of a flat bed without using bedrails?: A Little Help needed moving to and from a bed to a chair (including a wheelchair)?: A Little Help needed standing up from a chair using your arms (e.g., wheelchair or bedside chair)?: A Little Help needed to walk in hospital room?: A Little Help needed climbing 3-5 steps with a railing? : A Lot 6 Click Score: 18    End of Session Equipment Utilized During Treatment: Gait belt Activity Tolerance: Patient tolerated treatment well Patient left: in chair;with call bell/phone within reach;with chair alarm set Nurse Communication: Mobility status PT Visit Diagnosis: Muscle weakness (generalized) (M62.81);Difficulty in walking, not elsewhere classified (R26.2);Pain Pain - Right/Left: Left Pain - part of body: Hip    Time: 8864-8841 PT Time Calculation (min) (ACUTE ONLY): 23 min   Charges:   PT Evaluation $PT Eval Moderate Complexity: 1 Mod   PT General Charges $$ ACUTE PT VISIT: 1 Visit  Karlton Maya H. Delores, PT, DPT, NCS 12/30/23, 5:14 PM [phone]

## 2023-12-30 NOTE — TOC Progression Note (Addendum)
 Transition of Care [facility]) - Progression Note    Patient Details  Name: [name] MRN: [id number] Date of Birth: 09-11-1960  [facility] CM/SW Contact  Alfonso Rummer, LCSW Phone Number: 12/30/2023, 3:40 PM  Clinical Narrative:     Patient is confined to one room or one level of the home without a toilet. Patient will need a bedside commode.     Barriers to Discharge: Barriers Resolved               Expected Discharge Plan and Services         Expected Discharge Date: 12/30/23                                     Social Drivers of Health (SDOH) Interventions SDOH Screenings   Food Insecurity: No Food Insecurity (12/29/2023)  Housing: Low Risk  (12/29/2023)  Transportation Needs: No Transportation Needs (12/29/2023)  Utilities: Not At Risk (12/29/2023)  Depression (PHQ2-9): Low Risk  (12/15/2023)  Tobacco Use: High Risk (12/29/2023)    Readmission Risk Interventions     No data to display

## 2023-12-30 NOTE — Discharge Summary (Signed)
 Physician Discharge Summary   Patient: [name] MRN: [id number] DOB: 1960/11/16  Admit date:     12/29/2023  Discharge date: 12/30/23  Discharge Physician: Amaryllis Dare   PCP: Clora Marlee RODES, MD   Recommendations at discharge:  Please obtain CBC and BMP and follow-up Please ensure completion of antibiotics Please continue counseling for illicit drug use Follow-up with primary care provider within a week  Discharge Diagnoses: Principal Problem:   Aspiration pneumonia (HCC) Active Problems:   COPD (chronic obstructive pulmonary disease) (HCC)   Essential hypertension   HLD (hyperlipidemia)   Chronic diastolic CHF (congestive heart failure) (HCC)   AKI (acute kidney injury)   Diabetes mellitus without complication (HCC)   Lethargy   Smoker   Chronic pain syndrome   Pelvic fracture (HCC)   Depression with anxiety   Protein-calorie malnutrition, severe   Hospital Course: Partly taken from H&P.  [name] is a 63 y.o. female with medical history significant of hypertension, hyperlipidemia, diabetes mellitus, asthma, GERD, depression, anxiety, OSA not on CPAP, fibromyalgia, tobacco abuse, cocaine abuse, migraine headache, who presented with shortness of breath, pelvic fracture,  chest pain, SOB, lethargy.   Pt was seen in ED yesterday (10/5) due to fall and left hip pain. X-ray showed acute nondisplaced fracture of the left inferior pubic ramus.  Patient had negative CT scan of head and neck for acute injury. Pt was started on Norco for pain control. Pt becomes lethargic.   On presentation patient was initially hypotensive with blood pressure of 66/46, improved to 90s after 2 L of IV fluid bolus.  Labs with leukocytosis at 12, lactic acid 1.8, AKI.  Chest x-ray was negative for any acute abnormality.  CT chest, abdomen and pelvis with cardiomegaly and irregular heterogeneous ground glass airspace opacities throughout the dependent lung, consistent with  infection or aspiration.  Did show subacute to chronic nondisplaced fracture of left inferior pubic rami.  No other acute abnormalities.  Sedating medications were held and she was started on Unasyn.  10/7: Blood pressure now elevated, leukocytosis resolved, AKI resolved, preliminary blood cultures negative, strep pneumo negative, UDS positive for opioids and cocaine.  Restarting home Inderal .  PT recommended home health which was ordered.  Patient is going to stay with her daughter initially to get some extra assistance.  Counseling was provided again but patient denied any use of illicit drugs or cocaine.  She has been given Augmentin to complete the course of antibiotic for concern of aspiration pneumonia.  She will get benefit from a repeat chest imaging in 4 to 6 weeks by PCP.  She will continue on current medications and need to have a close follow-up with her providers for further assistance.   Consultants: None Procedures performed: None  Disposition: Home health Diet recommendation:  Discharge Diet Orders (From admission, onward)     Start     Ordered   12/30/23 0000  Diet - low sodium heart healthy        12/30/23 1058           Cardiac and Carb modified diet DISCHARGE MEDICATION: Allergies as of 12/30/2023       Reactions   Tramadol Itching   Headaches and confusion.   Benadryl [diphenhydramine Hcl] Itching   Bextra [valdecoxib] Itching   Invokana [canagliflozin] Other (See Comments)   Causes yeast infection   Sulfamethoxazole-trimethoprim Other (See Comments)   Makes me feel weird        Medication List  STOP taking these medications    cephALEXin  500 MG capsule Commonly known as: KEFLEX    venlafaxine  XR 150 MG 24 hr capsule Commonly known as: EFFEXOR -XR       TAKE these medications    Accu-Chek Guide test strip Generic drug: glucose blood USE TID   Accu-Chek Guide w/Device Kit USE TO TEST BLOOD SUGAR THREE TIMES DAILY OR AS DIRECTED  BY YOUR DOCTOR   albuterol  108 (90 Base) MCG/ACT inhaler Commonly known as: VENTOLIN  HFA Inhale 1 puff into the lungs 2 (two) times daily as needed.   amoxicillin -clavulanate 875-125 MG tablet Commonly known as: AUGMENTIN Take 1 tablet by mouth 2 (two) times daily for 5 days.   aspirin  81 MG tablet Take 81 mg by mouth daily.   cetirizine 10 MG tablet Commonly known as: ZYRTEC Take 10 mg by mouth at bedtime.   dextromethorphan-guaiFENesin  30-600 MG 12hr tablet Commonly known as: MUCINEX  DM Take 1 tablet by mouth 2 (two) times daily as needed for cough.   docusate sodium 100 MG capsule Commonly known as: COLACE Take 100 mg by mouth 2 (two) times daily.   famotidine  40 MG tablet Commonly known as: PEPCID  Take 20 mg by mouth daily.   feeding supplement Liqd Take 237 mLs by mouth 2 (two) times daily between meals.   HYDROcodone -acetaminophen  7.5-325 MG tablet Commonly known as: Norco Take 1 tablet by mouth every 6 (six) hours as needed for up to 5 days for severe pain (pain score 7-10). Must last 30 days Start taking on: February 20, 2024   lidocaine  5 % Commonly known as: LIDODERM  Place 1 patch onto the skin daily. Remove & Discard patch within 12 hours or as directed by MD   Linzess  290 MCG Caps capsule Generic drug: linaclotide  Take 290 mcg by mouth daily.   meclizine 25 MG tablet Commonly known as: ANTIVERT Take 25 mg by mouth 2 (two) times daily as needed for dizziness or nausea.   metFORMIN 1000 MG tablet Commonly known as: GLUCOPHAGE Take 1,000 mg by mouth 2 (two) times daily.   mirtazapine  45 MG tablet Commonly known as: REMERON  Take 45 mg by mouth at bedtime.   omeprazole 20 MG capsule Commonly known as: PRILOSEC Take 20 mg by mouth daily.   ondansetron  4 MG tablet Commonly known as: ZOFRAN  Take 4 mg by mouth every 8 (eight) hours as needed for nausea or vomiting.   pregabalin  100 MG capsule Commonly known as: LYRICA  Take 1 capsule (100 mg total)  by mouth 3 (three) times daily.   propranolol  40 MG tablet Commonly known as: INDERAL  Take 40 mg by mouth 2 (two) times daily.   rizatriptan 10 MG tablet Commonly known as: MAXALT Take 10 mg by mouth as needed for migraine. May repeat in 2 hours if needed   rosuvastatin  20 MG tablet Commonly known as: CRESTOR  Take 20 mg by mouth daily.   topiramate  25 MG tablet Commonly known as: TOPAMAX  Take 1 tablet (25 mg total) by mouth 2 (two) times daily.   traZODone  150 MG tablet Commonly known as: DESYREL  Take 150 mg by mouth at bedtime.        Follow-up Information     Clora Marlee RODES, MD. Go on 01/13/2024.   Specialty: Family Medicine Why: Go at 1:30pm. Contact information: [address] [place] [address] [phone]                Discharge Exam: Filed Weights   12/29/23 1625  Weight: 39.5  kg   General.  Malnourished lady, in no acute distress. Pulmonary.  Lungs clear bilaterally, normal respiratory effort. CV.  Regular rate and rhythm, no JVD, rub or murmur. Abdomen.  Soft, nontender, nondistended, BS positive. CNS.  Alert and oriented .  No focal neurologic deficit. Extremities.  No edema, no cyanosis, pulses intact and symmetrical. Psychiatry.  Judgment and insight appears normal.   Condition at discharge: stable  The results of significant diagnostics from this hospitalization (including imaging, microbiology, ancillary and laboratory) are listed below for reference.   Imaging Studies: CT Angio Chest/Abd/Pel for Dissection W and/or Wo Contrast Result Date: 12/29/2023 CLINICAL DATA:  Left-sided chest pain, dizziness and numbness in the left arm EXAM: CT ANGIOGRAPHY CHEST, ABDOMEN AND PELVIS TECHNIQUE: Non-contrast CT of the chest was initially obtained. Multidetector CT imaging through the chest, abdomen and pelvis was performed using the standard protocol during bolus administration of intravenous contrast. Multiplanar reconstructed images and MIPs  were obtained and reviewed to evaluate the vascular anatomy. RADIATION DOSE REDUCTION: This exam was performed according to the departmental dose-optimization program which includes automated exposure control, adjustment of the mA and/or kV according to patient size and/or use of iterative reconstruction technique. CONTRAST:  80mL OMNIPAQUE  IOHEXOL  350 MG/ML SOLN COMPARISON:  None Available. FINDINGS: CTA CHEST FINDINGS VASCULAR Aorta: Satisfactory opacification of the aorta. Normal contour and caliber of the thoracic aorta. No evidence of aneurysm, dissection, or other acute aortic pathology. Cardiovascular: No evidence of pulmonary embolism on limited non-tailored examination. Cardiomegaly. Marked prominence of the main pulmonary artery relative to the aorta, caliber 3.2 cm, as well as prominence and tortuosity of the pulmonary arteries. No pericardial effusion. Review of the MIP images confirms the above findings. NON VASCULAR Mediastinum/Nodes: No enlarged mediastinal, hilar, or axillary lymph nodes. Thyroid gland, trachea, and esophagus demonstrate no significant findings. Lungs/Pleura: Mild centrilobular and paraseptal emphysema. Diffuse bilateral bronchial wall thickening. Irregular heterogeneous and ground-glass airspace opacity throughout the dependent lungs (series 6, image 85). No pleural effusion or pneumothorax. Musculoskeletal: No chest wall abnormality. No acute osseous findings. Review of the MIP images confirms the above findings. CTA ABDOMEN AND PELVIS FINDINGS VASCULAR Normal contour and caliber of the abdominal aorta. No evidence of aneurysm, dissection, or other acute aortic pathology. Standard branching pattern of the abdominal aorta with solitary bilateral renal arteries. Moderate abdominal aortic atherosclerosis. Review of the MIP images confirms the above findings. NON-VASCULAR Hepatobiliary: No solid liver abnormality is seen. Cholecystectomy. No biliary ductal dilatation. Pancreas:  Unremarkable. No pancreatic ductal dilatation or surrounding inflammatory changes. Spleen: Normal in size without significant abnormality. Adrenals/Urinary Tract: Adrenal glands are unremarkable. Kidneys are normal, without renal calculi, solid lesion, or hydronephrosis. Bladder is unremarkable. Stomach/Bowel: Stomach is within normal limits. Appendix not clearly visualized. No evidence of bowel wall thickening, distention, or inflammatory changes. Lymphatic: No enlarged abdominal or pelvic lymph nodes. Reproductive: Hysterectomy. Other: No abdominal wall hernia or abnormality. No ascites. Musculoskeletal: Subacute or chronic nondisplaced fracture of the left inferior pubic ramus (series 5, image 278) IMPRESSION: 1. Normal contour and caliber of the thoracic and abdominal aorta. No evidence of aneurysm, dissection, or other acute aortic pathology. Moderate abdominal aortic atherosclerosis. 2. Cardiomegaly. Marked prominence of the main pulmonary artery relative to the aorta, caliber 3.2 cm, as well as prominence and tortuosity of the pulmonary arteries. Findings are suggestive of pulmonary hypertension. 3. Irregular heterogeneous and ground-glass airspace opacity throughout the dependent lungs, consistent with infection or aspiration. 4. Emphysema and diffuse bilateral bronchial wall thickening. 5. Subacute  or chronic nondisplaced fracture of the left inferior pubic ramus. Correlate for referable trauma. Aortic Atherosclerosis (ICD10-I70.0) and Emphysema (ICD10-J43.9). Electronically Signed   By: Marolyn JONETTA Jaksch M.D.   On: 12/29/2023 19:51   DG Chest 1 View Result Date: 12/29/2023 CLINICAL DATA:  [id number] Chest pain [id number] Chest pain,low blood pressure ,dizziness and nausea EXAM: CHEST  1 VIEW COMPARISON:  Chest x-ray 07/11/2021., CT C-spine 12/28/2023 FINDINGS: The heart and mediastinal contours are unchanged. Atherosclerotic plaque. Chronic coarsened interstitial markings with no overt pulmonary edema.  Similar-appearing nodular like airspace opacities overlying the left hilar region. No focal consolidation. No pulmonary edema. No pleural effusion. No pneumothorax. No acute osseous abnormality. IMPRESSION: 1. No active disease. 2. Aortic Atherosclerosis (ICD10-I70.0) and Emphysema (ICD10-J43.9). Electronically Signed   By: Morgane  Naveau M.D.   On: 12/29/2023 17:28   DG Hip Unilat W or Wo Pelvis 2-3 Views Left Result Date: 12/28/2023 EXAM: 2 OR MORE VIEW(S) XRAY OF THE PELVIS AND LEFT HIP 12/28/2023 12:05:00 PM COMPARISON: None available. CLINICAL HISTORY: fall injury. Table formatting from the original note was not included.; Pt c/o posterior head pain and L groin pain after falling last night after hitting a table and falling to the floor. Pt said she tripped over a rug. She is unsure of LOC. FINDINGS: JOINTS: SI joints are symmetric. Acute nondisplaced fracture of the left inferior pubic ramus. Bilateral hips demonstrate normal alignment. SOFT TISSUES: The soft tissues are unremarkable. IMPRESSION: 1. Acute nondisplaced fracture of the left inferior pubic ramus. Electronically signed by: Evalene Coho MD 12/28/2023 12:16 PM EDT RP Workstation: [id number]   CT Head Wo Contrast Result Date: 12/28/2023 CLINICAL DATA:  Provided history: Head trauma, moderate/severe. Neck trauma, uncomplicated. Additional history provided: Fall, posterior head pain. EXAM: CT HEAD WITHOUT CONTRAST CT CERVICAL SPINE WITHOUT CONTRAST TECHNIQUE: Multidetector CT imaging of the head and cervical spine was performed following the standard protocol without intravenous contrast. Multiplanar CT image reconstructions of the cervical spine were also generated. RADIATION DOSE REDUCTION: This exam was performed according to the departmental dose-optimization program which includes automated exposure control, adjustment of the mA and/or kV according to patient size and/or use of iterative reconstruction technique. COMPARISON:  Head CT  12/11/2023. FINDINGS: CT HEAD FINDINGS Brain: No age-advanced or lobar predominant cerebral atrophy. Redemonstrated prominent perivascular space within the left basal ganglia inferiorly. Patchy and ill-defined hypoattenuation within the cerebral white matter, nonspecific but compatible with mild chronic small vessel ischemic disease. There is no acute intracranial hemorrhage. No demarcated cortical infarct. No extra-axial fluid collection. No evidence of an intracranial mass. No midline shift. Vascular: No hyperdense vessel.  Atherosclerotic calcifications. Skull: No calvarial fracture or aggressive osseous lesion. Sinuses/Orbits: No mass or acute finding within the imaged orbits. No significant paranasal sinus disease at the imaged levels. CT CERVICAL SPINE FINDINGS Alignment: Nonspecific reversal of the expected cervical lordosis. 2 mm C2-C3 grade 1 anterolisthesis. Skull base and vertebrae: The basion-dental and atlanto-dental intervals are maintained.No evidence of acute fracture to the cervical spine. Soft tissues and spinal canal: No prevertebral fluid or swelling. No visible canal hematoma. Disc levels: Cervical spondylosis with multilevel disc space narrowing, disc bulges/central disc protrusions, endplate spurring and uncovertebral hypertrophy. Facet arthropathy on the left at C2-C3. Disc space narrowing is greatest at C3-C4 and C4-C5 (moderate at these levels). Central disc protrusions contribute to up to moderate spinal canal stenosis at C3-C4 and C4-C5. Multilevel bony neural foraminal narrowing. Multilevel ventral osteophytes. Degenerative changes also present at the C1-C2 articulation. Upper chest: No  consolidation within the imaged lung apices. No visible pneumothorax. Centrilobular and paraseptal emphysema. Other: Cervical rib on the left (anatomic variant). Calcified atherosclerotic plaque about the right bifurcation. IMPRESSION: CT head: 1. No evidence of an acute intracranial abnormality. 2. Mild  chronic small vessel ischemic changes within the cerebral white matter. CT cervical spine: 1. No evidence of an acute cervical spine fracture. 2. Nonspecific reversal of the expected cervical lordosis. 3. 2 mm C2-C3 grade 1 anterolisthesis. 4. Emphysema (ICD10-J43.9). Electronically Signed   By: Rockey Childs D.O.   On: 12/28/2023 11:14   CT Cervical Spine Wo Contrast Result Date: 12/28/2023 CLINICAL DATA:  Provided history: Head trauma, moderate/severe. Neck trauma, uncomplicated. Additional history provided: Fall, posterior head pain. EXAM: CT HEAD WITHOUT CONTRAST CT CERVICAL SPINE WITHOUT CONTRAST TECHNIQUE: Multidetector CT imaging of the head and cervical spine was performed following the standard protocol without intravenous contrast. Multiplanar CT image reconstructions of the cervical spine were also generated. RADIATION DOSE REDUCTION: This exam was performed according to the departmental dose-optimization program which includes automated exposure control, adjustment of the mA and/or kV according to patient size and/or use of iterative reconstruction technique. COMPARISON:  Head CT 12/11/2023. FINDINGS: CT HEAD FINDINGS Brain: No age-advanced or lobar predominant cerebral atrophy. Redemonstrated prominent perivascular space within the left basal ganglia inferiorly. Patchy and ill-defined hypoattenuation within the cerebral white matter, nonspecific but compatible with mild chronic small vessel ischemic disease. There is no acute intracranial hemorrhage. No demarcated cortical infarct. No extra-axial fluid collection. No evidence of an intracranial mass. No midline shift. Vascular: No hyperdense vessel.  Atherosclerotic calcifications. Skull: No calvarial fracture or aggressive osseous lesion. Sinuses/Orbits: No mass or acute finding within the imaged orbits. No significant paranasal sinus disease at the imaged levels. CT CERVICAL SPINE FINDINGS Alignment: Nonspecific reversal of the expected cervical  lordosis. 2 mm C2-C3 grade 1 anterolisthesis. Skull base and vertebrae: The basion-dental and atlanto-dental intervals are maintained.No evidence of acute fracture to the cervical spine. Soft tissues and spinal canal: No prevertebral fluid or swelling. No visible canal hematoma. Disc levels: Cervical spondylosis with multilevel disc space narrowing, disc bulges/central disc protrusions, endplate spurring and uncovertebral hypertrophy. Facet arthropathy on the left at C2-C3. Disc space narrowing is greatest at C3-C4 and C4-C5 (moderate at these levels). Central disc protrusions contribute to up to moderate spinal canal stenosis at C3-C4 and C4-C5. Multilevel bony neural foraminal narrowing. Multilevel ventral osteophytes. Degenerative changes also present at the C1-C2 articulation. Upper chest: No consolidation within the imaged lung apices. No visible pneumothorax. Centrilobular and paraseptal emphysema. Other: Cervical rib on the left (anatomic variant). Calcified atherosclerotic plaque about the right bifurcation. IMPRESSION: CT head: 1. No evidence of an acute intracranial abnormality. 2. Mild chronic small vessel ischemic changes within the cerebral white matter. CT cervical spine: 1. No evidence of an acute cervical spine fracture. 2. Nonspecific reversal of the expected cervical lordosis. 3. 2 mm C2-C3 grade 1 anterolisthesis. 4. Emphysema (ICD10-J43.9). Electronically Signed   By: Rockey Childs D.O.   On: 12/28/2023 11:14   CT Head Wo Contrast Result Date: 12/11/2023 CLINICAL DATA:  headache EXAM: CT HEAD WITHOUT CONTRAST TECHNIQUE: Contiguous axial images were obtained from the base of the skull through the vertex without intravenous contrast. RADIATION DOSE REDUCTION: This exam was performed according to the departmental dose-optimization program which includes automated exposure control, adjustment of the mA and/or kV according to patient size and/or use of iterative reconstruction technique. COMPARISON:   CT head 04/04/2021 FINDINGS: Brain: No evidence  of large-territorial acute infarction. No parenchymal hemorrhage. No mass lesion. No extra-axial collection. No mass effect or midline shift. No hydrocephalus. Basilar cisterns are patent. Vascular: No hyperdense vessel. Skull: No acute fracture or focal lesion. Sinuses/Orbits: Paranasal sinuses and mastoid air cells are clear. The orbits are unremarkable. Other: None. IMPRESSION: No acute intracranial abnormality. Electronically Signed   By: Morgane  Naveau M.D.   On: 12/11/2023 17:28    Microbiology: Results for orders placed or performed during the hospital encounter of 12/29/23  Blood culture (routine x 2)     Status: None (Preliminary result)   Collection Time: 12/29/23  6:11 PM   Specimen: BLOOD  Result Value Ref Range Status   Specimen Description BLOOD BLOOD RIGHT ARM  Final   Special Requests   Final    BOTTLES DRAWN AEROBIC AND ANAEROBIC Blood Culture adequate volume   Culture   Final    NO GROWTH < 12 HOURS Performed at [facility], [address] [place], [place] [address]    Report Status PENDING  Incomplete  Blood culture (routine x 2)     Status: None (Preliminary result)   Collection Time: 12/29/23  8:53 PM   Specimen: BLOOD  Result Value Ref Range Status   Specimen Description BLOOD BLOOD RIGHT HAND  Final   Special Requests   Final    BOTTLES DRAWN AEROBIC ONLY Blood Culture results may not be optimal due to an inadequate volume of blood received in culture bottles   Culture   Final    NO GROWTH < 12 HOURS Performed at [facility], [address] [place], [place] [address]    Report Status PENDING  Incomplete    Labs: CBC: Recent Labs  Lab 12/28/23 1236 12/29/23 1629 12/30/23 0417  WBC 10.8* 12.0* 9.1  HGB 13.4 13.1 11.8*  HCT 41.2 42.5 36.1  MCV 87.3 93.0 86.8  PLT 175 159 151   Basic Metabolic Panel: Recent Labs  Lab 12/28/23 1236 12/29/23 1752 12/30/23 0417  NA 137 138 140   K 4.0 4.2 4.2  CL 100 105 110  CO2 25 21* 24  GLUCOSE 440* 183* 230*  BUN 17 17 22   CREATININE 0.70 1.43* 0.84  CALCIUM  8.9 8.1* 8.2*   Liver Function Tests: Recent Labs  Lab 12/28/23 1236  AST 31  ALT 36  ALKPHOS 70  BILITOT 0.8  PROT 6.4*  ALBUMIN 3.4*   CBG: Recent Labs  Lab 12/29/23 2216 12/30/23 0813  GLUCAP 152* 165*    Discharge time spent: greater than 30 minutes.  This record has been created using Conservation officer, historic buildings. Errors have been sought and corrected,but may not always be located. Such creation errors do not reflect on the standard of care.   Signed: Amaryllis Dare, MD [facility] 12/30/2023

## 2023-12-30 NOTE — Hospital Course (Addendum)
 Partly taken from H&P.  [name] is a 63 y.o. female with medical history significant of hypertension, hyperlipidemia, diabetes mellitus, asthma, GERD, depression, anxiety, OSA not on CPAP, fibromyalgia, tobacco abuse, cocaine abuse, migraine headache, who presented with shortness of breath, pelvic fracture,  chest pain, SOB, lethargy.   Pt was seen in ED yesterday (10/5) due to fall and left hip pain. X-ray showed acute nondisplaced fracture of the left inferior pubic ramus.  Patient had negative CT scan of head and neck for acute injury. Pt was started on Norco for pain control. Pt becomes lethargic.   On presentation patient was initially hypotensive with blood pressure of 66/46, improved to 90s after 2 L of IV fluid bolus.  Labs with leukocytosis at 12, lactic acid 1.8, AKI.  Chest x-ray was negative for any acute abnormality.  CT chest, abdomen and pelvis with cardiomegaly and irregular heterogeneous ground glass airspace opacities throughout the dependent lung, consistent with infection or aspiration.  Did show subacute to chronic nondisplaced fracture of left inferior pubic rami.  No other acute abnormalities.  Sedating medications were held and she was started on Unasyn.  10/7: Blood pressure now elevated, leukocytosis resolved, AKI resolved, preliminary blood cultures negative, strep pneumo negative, UDS positive for opioids and cocaine.  Restarting home Inderal .  PT recommended home health which was ordered.  Patient is going to stay with her daughter initially to get some extra assistance.  Counseling was provided again but patient denied any use of illicit drugs or cocaine.  She has been given Augmentin to complete the course of antibiotic for concern of aspiration pneumonia.  She will get benefit from a repeat chest imaging in 4 to 6 weeks by PCP.  She will continue on current medications and need to have a close follow-up with her providers for further assistance.

## 2023-12-30 NOTE — Progress Notes (Signed)
 PHARMACY NOTE:  ANTIMICROBIAL RENAL DOSAGE ADJUSTMENT  Current antimicrobial regimen includes a mismatch between antimicrobial dosage and estimated renal function.  As per policy approved by the Pharmacy & Therapeutics and Medical Executive Committees, the antimicrobial dosage will be adjusted accordingly.  Current antimicrobial dosage:  Unasyn 3 gm q12h  Indication: aspiration pneumonia  Renal Function:  Estimated Creatinine Clearance: 42.7 mL/min (by C-G formula based on SCr of 0.84 mg/dL).  Antimicrobial dosage has been changed to:  Unasyn 3 gm IV q6h  Additional comments:   Thank you for allowing pharmacy to be a part of this patient's care.  Allean Haas PharmD Clinical Pharmacist 12/30/2023

## 2023-12-30 NOTE — TOC Transition Note (Signed)
 Transition of Care [facility]) - Discharge Note   Patient Details  Name: [name] MRN: [id number] Date of Birth: 1961-01-06  Transition of Care [facility]) CM/SW Contact:  Alfonso Rummer, LCSW Phone Number: 12/30/2023, 11:53 AM   Clinical Narrative:     [name] Rummer spk with pt daughter Tawni Clickscales via phone. Pt daughter verbalized understanding of outpatient physical therapy. Home health agencies are not in ntwk with home health resulting in patient commuting to outpatient pt rehab at [facility].   Final next level of care: Home/Self Care Barriers to Discharge: Barriers Resolved   Patient Goals and CMS Choice            Discharge Placement             Home with outpatient pt      Name of family member notified: Tawni Clickscales Patient and family notified of of transfer: 12/30/23  Discharge Plan and Services Additional resources added to the After Visit Summary for                                       Social Drivers of Health (SDOH) Interventions SDOH Screenings   Food Insecurity: No Food Insecurity (12/29/2023)  Housing: Low Risk  (12/29/2023)  Transportation Needs: No Transportation Needs (12/29/2023)  Utilities: Not At Risk (12/29/2023)  Depression (PHQ2-9): Low Risk  (12/15/2023)  Tobacco Use: High Risk (12/29/2023)     Readmission Risk Interventions     No data to display

## 2023-12-30 NOTE — Evaluation (Signed)
 Occupational Therapy Evaluation Patient Details Name: [name] MRN: [id number] DOB: [birth year] Today's Date: 12/30/2023   History of Present Illness   Pt is a 63 y.o. female who presented with shortness of breath, rectal bleeding, pelvic fracture, who presents with chest pain, SOB, lethargy. Imaging shows acute nondisplaced fracture of the left inferior pubic ramus. MD assessment: Aspiration pneumoni, COPD. PMH of hypertension, hyperlipidemia, diabetes mellitus, asthma, GERD, depression, anxiety, OSA not on CPAP, fibromyalgia, tobacco abuse, cocaine abuse, migraine headache.     Clinical Impressions Pt was seen for OT evaluation this date. PTA, pt reports she was living in her apartment alone after recently kicking her son out who had been staying the last 2 months. At baseline, she reports MOD I/IND with ADLs and increased difficulty with IADLs. She does not drive, daughter takes her to get groceries twice a month. She endorses multiple falls in the last 6 months.  Pt presents with deficits in strength, balance, activity tolerance, safety awareness and pain management, affecting safe and optimal ADL completion. Pt currently requires MOD I/supervision for bed mobility using bed features. Pt with fully soiled linens from purewick malfunction requiring peri-care, gown change and BSC use. Pt demo STS from EOB to RW and SPT to [facility] with CGA. She performed LB dressing with Mod A, verb cues. LB bathing/peri-care performed with Min A in standing with unilateral support on RW. She was able to turn and take a few steps to reach recliner using RW with cues for sequencing/safety and CGA. Pt was placed on RA at start of session d/t stable sp02 readings and maintained readings of 94-95% on RA throughout. Pt is adamant she would like to return to her daughter's home upon DC. Recommend a youth sized RW/versus RW and BSC for safe DC home. She would benefit from skilled OT services to address noted  impairments and functional limitations to maximize return to PLOF. Do anticipate the need for follow up OT services upon acute hospital DC.      If plan is discharge home, recommend the following:   A little help with walking and/or transfers;A little help with bathing/dressing/bathroom;Help with stairs or ramp for entrance;Assistance with cooking/housework;Assist for transportation     Functional Status Assessment   Patient has had a recent decline in their functional status and demonstrates the ability to make significant improvements in function in a reasonable and predictable amount of time.     Equipment Recommendations   BSC/3in1;Other (comment) (RW versus youth RW)     Recommendations for Other Services         Precautions/Restrictions   Precautions Precautions: Fall Recall of Precautions/Restrictions: Impaired Restrictions Weight Bearing Restrictions Per Provider Order: No Other Position/Activity Restrictions: WBAT LLE pubic ramus fx     Mobility Bed Mobility Overal bed mobility: Needs Assistance Bed Mobility: Supine to Sit     Supine to sit: Modified independent (Device/Increase time), Used rails, HOB elevated     General bed mobility comments: increased time/effort, but no physical assist provided    Transfers Overall transfer level: Needs assistance Equipment used: Rolling walker (2 wheels) Transfers: Sit to/from Stand, Bed to chair/wheelchair/BSC Sit to Stand: Contact guard assist     Step pivot transfers: Contact guard assist     General transfer comment: able to stand from lowest bed height with increased time and CGA with cues for safety/sequencing during step pivot to [facility], then able to turn and take steps to recliner using RW with CGA  Balance Overall balance assessment: Needs assistance Sitting-balance support: Feet supported Sitting balance-Leahy Scale: Good     Standing balance support: Bilateral upper extremity supported,  Reliant on assistive device for balance, During functional activity Standing balance-Leahy Scale: Fair Standing balance comment: CGA, RW use during standing dressing/ADLs                           ADL either performed or assessed with clinical judgement   ADL Overall ADL's : Needs assistance/impaired             Lower Body Bathing: Sit to/from stand;Sitting/lateral leans;Minimal assistance Lower Body Bathing Details (indicate cue type and reason): able to perform peri-area bathing and down to her ankles in standing with unilateral support on RW     Lower Body Dressing: Maximal assistance;Set up;Sitting/lateral leans;Moderate assistance Lower Body Dressing Details (indicate cue type and reason): able to donn R sock with set up assist, Max A for L sock via figure four seated EOB; Mod A to doff mesh underwear Toilet Transfer: BSC/3in1;Stand-pivot;Rolling walker (2 wheels);Contact guard assist Toilet Transfer Details (indicate cue type and reason): verb cues for sequencing/safety         Functional mobility during ADLs: Contact guard assist;Cueing for safety;Cueing for sequencing;Rolling walker (2 wheels)       Vision         Perception         Praxis         Pertinent Vitals/Pain Pain Assessment Pain Assessment: 0-10 Pain Score: 8  Pain Location: L groin Pain Descriptors / Indicators: Aching, Sore Pain Intervention(s): Monitored during session, Repositioned, Limited activity within patient's tolerance     Extremity/Trunk Assessment Upper Extremity Assessment Upper Extremity Assessment: Overall WFL for tasks assessed   Lower Extremity Assessment Lower Extremity Assessment: Generalized weakness;LLE deficits/detail LLE Deficits / Details: pain from fracture with expected weakness       Communication Communication Communication: No apparent difficulties   Cognition Arousal: Alert Behavior During Therapy: WFL for tasks assessed/performed Cognition:  No apparent impairments             OT - Cognition Comments: A and O x4; seems a little off at times, but able to answer all questions appropriately and follow directions                 Following commands: Intact       Cueing  General Comments   Cueing Techniques: Verbal cues  urine incont with reported burning; able to be placed on RA with sp02 94-95% throughout session   Exercises Other Exercises Other Exercises: Edu on role of OT, DC recommendations, DME needs and need for assist from daughter to safely return home. Pt verbalized understanding.   Shoulder Instructions      Home Living Family/patient expects to be discharged to:: Private residence Living Arrangements: Alone Available Help at Discharge: Family;Available PRN/intermittently (goes with daughter twice a month to get groceries) Type of Home: Apartment Home Access: Level entry     Home Layout: One level     Bathroom Shower/Tub: Chief Strategy Officer: Standard     Home Equipment: Agricultural consultant (2 wheels)   Additional Comments: reports son stayed with her for about 2 months, but she had to kick him out; reports she will be moving in with her daughter for a bit to recover after this hospitalization      Prior Functioning/Environment Prior Level of Function : Independent/Modified Independent  Mobility Comments: no AD use at baseline; reporst multiple falls in the last 6 months ADLs Comments: reports IND with ADLs, but more difficulty with household chores and had to kick her son out recently after he stayed with her for 2 months because he was making a mess    OT Problem List: Decreased strength;Pain;Impaired balance (sitting and/or standing);Decreased activity tolerance;Decreased safety awareness   OT Treatment/Interventions: Self-care/ADL training;Therapeutic exercise;Therapeutic activities;DME and/or AE instruction;Patient/family education;Balance training       OT Goals(Current goals can be found in the care plan section)   Acute Rehab OT Goals Patient Stated Goal: go home with her daughter OT Goal Formulation: With patient Time For Goal Achievement: 01/13/24 Potential to Achieve Goals: Good ADL Goals Pt Will Perform Lower Body Bathing: with supervision;sitting/lateral leans;sit to/from stand;with adaptive equipment Pt Will Perform Lower Body Dressing: with supervision;sitting/lateral leans;sit to/from stand;with adaptive equipment Pt Will Transfer to Toilet: with supervision;ambulating;bedside commode   OT Frequency:  Min 2X/week    Co-evaluation              AM-PAC OT 6 Clicks Daily Activity     Outcome Measure Help from another person eating meals?: None Help from another person taking care of personal grooming?: A Little Help from another person toileting, which includes using toliet, bedpan, or urinal?: A Little Help from another person bathing (including washing, rinsing, drying)?: A Little Help from another person to put on and taking off regular upper body clothing?: None Help from another person to put on and taking off regular lower body clothing?: A Little 6 Click Score: 20   End of Session Equipment Utilized During Treatment: Gait belt;Rolling walker (2 wheels) Nurse Communication: Mobility status  Activity Tolerance: Patient tolerated treatment well Patient left: in chair;with call bell/phone within reach;with chair alarm set  OT Visit Diagnosis: Other abnormalities of gait and mobility (R26.89);Muscle weakness (generalized) (M62.81);Unsteadiness on feet (R26.81);History of falling (Z91.81)                Time: 9144-9067 OT Time Calculation (min): 37 min Charges:  OT General Charges $OT Visit: 1 Visit OT Evaluation $OT Eval Moderate Complexity: 1 Mod OT Treatments $Self Care/Home Management : 8-22 mins Zackry Deines, OTR/L 12/30/23, 9:51 AM  Duwaine FORBES Saupe 12/30/2023, 9:46 AM

## 2023-12-31 LAB — LEGIONELLA PNEUMOPHILA SEROGP 1 UR AG: L. pneumophila Serogp 1 Ur Ag: NEGATIVE

## 2024-01-03 LAB — CULTURE, BLOOD (ROUTINE X 2)
Culture: NO GROWTH
Culture: NO GROWTH
Special Requests: ADEQUATE

## 2024-01-05 ENCOUNTER — Emergency Department
Admission: EM | Admit: 2024-01-05 | Discharge: 2024-01-05 | Disposition: A | Discharge status: Home / Self Care | Payer: MEDICAID | Attending: Emergency Medicine | Admitting: Emergency Medicine

## 2024-01-05 ENCOUNTER — Other Ambulatory Visit: Payer: Self-pay

## 2024-01-05 ENCOUNTER — Emergency Department: Payer: MEDICAID

## 2024-01-05 ENCOUNTER — Encounter: Payer: Self-pay | Admitting: Emergency Medicine

## 2024-01-05 DIAGNOSIS — R109 Unspecified abdominal pain: Secondary | ICD-10-CM | POA: Insufficient documentation

## 2024-01-05 DIAGNOSIS — R918 Other nonspecific abnormal finding of lung field: Secondary | ICD-10-CM | POA: Diagnosis not present

## 2024-01-05 LAB — COMPREHENSIVE METABOLIC PANEL WITH GFR
ALT: 31 U/L (ref 0–44)
AST: 16 U/L (ref 15–41)
Albumin: 3.6 g/dL (ref 3.5–5.0)
Alkaline Phosphatase: 97 U/L (ref 38–126)
Anion gap: 14 (ref 5–15)
BUN: 18 mg/dL (ref 8–23)
CO2: 25 mmol/L (ref 22–32)
Calcium: 9.8 mg/dL (ref 8.9–10.3)
Chloride: 99 mmol/L (ref 98–111)
Creatinine, Ser: 0.78 mg/dL (ref 0.44–1.00)
GFR, Estimated: >60 mL/min (ref >60)
Glucose, Bld: 155 mg/dL — ABNORMAL HIGH (ref 70–99)
Potassium: 4.7 mmol/L (ref 3.5–5.1)
Sodium: 138 mmol/L (ref 135–145)
Total Bilirubin: 0.6 mg/dL (ref 0.0–1.2)
Total Protein: 7 g/dL (ref 6.5–8.1)

## 2024-01-05 LAB — URINE DRUG SCREEN, QUALITATIVE (ARMC ONLY)
Amphetamines, Ur Screen: NOT DETECTED
Barbiturates, Ur Screen: NOT DETECTED
Benzodiazepine, Ur Scrn: NOT DETECTED
Cannabinoid 50 Ng, Ur ~~LOC~~: NOT DETECTED
Cocaine Metabolite,Ur ~~LOC~~: NOT DETECTED
MDMA (Ecstasy)Ur Screen: NOT DETECTED
Methadone Scn, Ur: NOT DETECTED
Opiate, Ur Screen: POSITIVE — AB
Phencyclidine (PCP) Ur S: NOT DETECTED
Tricyclic, Ur Screen: NOT DETECTED

## 2024-01-05 LAB — URINALYSIS, ROUTINE W REFLEX MICROSCOPIC
Bacteria, UA: NONE SEEN
Bilirubin Urine: NEGATIVE
Glucose, UA: >=500 mg/dL — AB
Hgb urine dipstick: NEGATIVE
Ketones, ur: NEGATIVE mg/dL
Leukocytes,Ua: NEGATIVE
Nitrite: NEGATIVE
Protein, ur: NEGATIVE mg/dL
Specific Gravity, Urine: 1.014 (ref 1.005–1.030)
pH: 7 (ref 5.0–8.0)

## 2024-01-05 LAB — CBC
HCT: 42.2 % (ref 36.0–46.0)
Hemoglobin: 13.5 g/dL (ref 12.0–15.0)
MCH: 28.2 pg (ref 26.0–34.0)
MCHC: 32 g/dL (ref 30.0–36.0)
MCV: 88.1 fL (ref 80.0–100.0)
Platelets: 356 K/uL (ref 150–400)
RBC: 4.79 MIL/uL (ref 3.87–5.11)
RDW: 13.6 % (ref 11.5–15.5)
WBC: 9.3 K/uL (ref 4.0–10.5)
nRBC: 0 % (ref 0.0–0.2)

## 2024-01-05 LAB — ETHANOL: Alcohol, Ethyl (B): <15 mg/dL (ref <15)

## 2024-01-05 LAB — LIPASE, BLOOD: Lipase: 28 U/L (ref 11–51)

## 2024-01-05 LAB — TSH: TSH: 2.831 u[IU]/mL (ref 0.350–4.500)

## 2024-01-05 MED ORDER — KETOROLAC TROMETHAMINE 15 MG/ML IJ SOLN
15.0000 mg | Freq: Once | INTRAMUSCULAR | Status: AC
  Administered 2024-01-05: 15 mg via INTRAVENOUS
  Filled 2024-01-05: qty 1

## 2024-01-05 MED ORDER — OXYCODONE-ACETAMINOPHEN 5-325 MG PO TABS
1.0000 | ORAL_TABLET | Freq: Once | ORAL | Status: AC
Start: 2024-01-05 — End: 2024-01-05
  Administered 2024-01-05: 1 via ORAL
  Filled 2024-01-05: qty 1

## 2024-01-05 MED ORDER — ONDANSETRON HCL 4 MG/2ML IJ SOLN
4.0000 mg | Freq: Once | INTRAMUSCULAR | Status: AC
  Administered 2024-01-05: 4 mg via INTRAVENOUS
  Filled 2024-01-05: qty 2

## 2024-01-05 MED ORDER — IOHEXOL 300 MG/ML  SOLN
100.0000 mL | Freq: Once | INTRAMUSCULAR | Status: AC | PRN
  Administered 2024-01-05: 100 mL via INTRAVENOUS

## 2024-01-05 MED ORDER — FAMOTIDINE IN NACL 20-0.9 MG/50ML-% IV SOLN
20.0000 mg | Freq: Once | INTRAVENOUS | Status: AC
  Administered 2024-01-05: 20 mg via INTRAVENOUS
  Filled 2024-01-05: qty 50

## 2024-01-05 MED ORDER — SODIUM CHLORIDE 0.9 % IV BOLUS
1000.0000 mL | Freq: Once | INTRAVENOUS | Status: AC
  Administered 2024-01-05: 1000 mL via INTRAVENOUS

## 2024-01-05 NOTE — ED Triage Notes (Signed)
 Pt to ED via POV for nausea and right side pain. Pt states that the pain has been there for about 4-5 days. Pt states that she is currently being treated for pneumonia and also was just recently diagnosed with a Pelvic fx. Pt is in NAD.

## 2024-01-05 NOTE — ED Notes (Signed)
 Red top sent down

## 2024-01-05 NOTE — ED Provider Notes (Addendum)
[facility] Provider Note    Event Date/Time   First MD Initiated Contact with Patient 01/05/24 1111     (approximate)   History   Nausea and Abdominal Pain   HPI  [name] is a 63 y.o. female presents to the emergency department with abdominal pain.  Patient has a past medical history significant for recent fall with pubic ramus fracture.  Was discharged home from a recent hospitalization.  Returns to the emergency department per day for right sided abdominal pain.  Associate with nausea but no episodes of vomiting.  Denies any dysuria, urinary urgency or frequency.  States that she is currently on antibiotics for pneumonia.  Denies any recent cocaine use.  Denies any significant alcohol use.  Denies any new falls or trauma.  Denies any chest pain or shortness of breath.  Pain is not worse with deep inspiration.  No history of kidney stones.  No dysuria, urinary urgency or frequency.  Unknown of last bowel movement.     Physical Exam   Triage Vital Signs: ED Triage Vitals  Encounter Vitals Group     BP 01/05/24 1019 (!) 132/91     Girls Systolic BP Percentile --      Girls Diastolic BP Percentile --      Boys Systolic BP Percentile --      Boys Diastolic BP Percentile --      Pulse Rate 01/05/24 1019 84     Resp 01/05/24 1019 16     Temp 01/05/24 1019 98.4 F (36.9 C)     Temp src --      SpO2 01/05/24 1019 97 %     Weight 01/05/24 1020 87 lb (39.5 kg)     Height 01/05/24 1020 4' 11 (1.499 m)     Head Circumference --      Peak Flow --      Pain Score 01/05/24 1020 9     Pain Loc --      Pain Education --      Exclude from Growth Chart --     Most recent vital signs: Vitals:   01/05/24 1122 01/05/24 1433  BP:  (!) 146/87  Pulse:  80  Resp:  18  Temp:  98.3 F (36.8 C)  SpO2: 97% 96%    Physical Exam Constitutional:      Appearance: She is well-developed.  HENT:     Head: Atraumatic.  Eyes:     Conjunctiva/sclera:  Conjunctivae normal.  Cardiovascular:     Rate and Rhythm: Regular rhythm.  Pulmonary:     Effort: No respiratory distress.  Abdominal:     General: There is no distension.     Tenderness: There is abdominal tenderness in the right upper quadrant and right lower quadrant.  Musculoskeletal:        General: Normal range of motion.     Cervical back: Normal range of motion.  Skin:    General: Skin is warm.     Capillary Refill: Capillary refill takes less than 2 seconds.  Neurological:     General: No focal deficit present.     Mental Status: She is alert. Mental status is at baseline.  Psychiatric:        Mood and Affect: Mood normal.     IMPRESSION / MDM / ASSESSMENT AND PLAN / ED COURSE  I reviewed the triage vital signs and the nursing notes.  Differential diagnosis including pyelonephritis, intra-abdominal abscess, referred pain from  her known pubic ramus fractures, constipation, acute cholecystitis, small bowel obstruction, constipation  No chest pain or shortness of breath have a very low suspicion for ACS.  Low suspicion for pulmonary embolism, no pleuritic chest pain and does have reproducible pain on abdominal exam.    No tachycardic or bradycardic dysrhythmias while on cardiac telemetry.  RADIOLOGY CT scan abdomen and pelvis with no acute findings.  Nondisplaced fractures to the pelvic ramus which is known to the patient.  Proved aeration to both lungs with ongoing scarring and opacities. LABS (all labs ordered are listed, but only abnormal results are displayed) Labs interpreted as -    Labs Reviewed  COMPREHENSIVE METABOLIC PANEL WITH GFR - Abnormal; Notable for the following components:      Result Value   Glucose, Bld 155 (*)    All other components within normal limits  URINALYSIS, ROUTINE W REFLEX MICROSCOPIC - Abnormal; Notable for the following components:   Color, Urine STRAW (*)    APPearance CLEAR (*)    Glucose, UA >=500 (*)    All other components  within normal limits  URINE DRUG SCREEN, QUALITATIVE ([facility] ONLY) - Abnormal; Notable for the following components:   Opiate, Ur Screen POSITIVE (*)    All other components within normal limits  LIPASE, BLOOD  CBC  TSH  ETHANOL     MDM    Patient given IV fluids and pain medication.  On reevaluation states she is feeling better.  CT scan without obvious findings to explain her pain.  No signs of a urinary tract infection.  No significant leukocytosis and patient's creatinine is at baseline.  No blood in her urine and no signs of a kidney stone.  No pyelonephritis.  No significant leukocytosis or anemia.  Normal LFTs.  Concern for possible referred pain versus constipation.  Have a low suspicion for pulmonary embolism.  Patient has a follow-up appointment tomorrow with her primary care physician.  Discussed symptomatic treatment.  Discussed return to the emergency department for any worsening symptoms.  No questions at time of discharge.  Discussed the needs for follow-up given her tobacco use and opacities found on CT scan, after completion of her antibiotics for pneumonia need to make sure she did not have an underlying malignancy.  Patient expressed understanding will follow-up with her primary care physician tomorrow.   PROCEDURES:  Critical Care performed: No  Procedures  Patient's presentation is most consistent with acute presentation with potential threat to life or bodily function.   MEDICATIONS ORDERED IN ED: Medications  ondansetron  (ZOFRAN ) injection 4 mg (4 mg Intravenous Given 01/05/24 1233)  sodium chloride  0.9 % bolus 1,000 mL (0 mLs Intravenous Stopped 01/05/24 1425)  ketorolac  (TORADOL ) 15 MG/ML injection 15 mg (15 mg Intravenous Given 01/05/24 1233)  famotidine  (PEPCID ) IVPB 20 mg premix (0 mg Intravenous Stopped 01/05/24 1346)  iohexol  (OMNIPAQUE ) 300 MG/ML solution 100 mL (100 mLs Intravenous Contrast Given 01/05/24 1258)  oxyCODONE -acetaminophen   (PERCOCET/ROXICET) 5-325 MG per tablet 1 tablet (1 tablet Oral Given 01/05/24 1455)    FINAL CLINICAL IMPRESSION(S) / ED DIAGNOSES   Final diagnoses:  Right sided abdominal pain  Opacity of lung on imaging study     Rx / DC Orders   ED Discharge Orders     None        Note:  This document was prepared using Dragon voice recognition software and may include unintentional dictation errors.   Suzanne Kirsch, MD 01/05/24 8378    Suzanne Kirsch, MD 01/05/24  1621  

## 2024-01-05 NOTE — Discharge Instructions (Addendum)
 You were seen in the emergency department for right sided abdominal pain.  Your lab work was overall normal.  Your urine did not show any signs of a urinary tract infection.  Your CT scan did not show any abnormalities to explain your pain today.  They did note opacities on your CT scan of your lungs.  You are currently on antibiotics, continue to take your antibiotics as prescribed.  Go to your appointment with your primary care physician tomorrow.  Return to the emergency department if you have any shortness of breath, chest pain or worsening symptoms.  Stay hydrated and drink plenty of fluids.  You can take MiraLAX  as needed for constipation.

## 2024-01-08 ENCOUNTER — Ambulatory Visit: Payer: MEDICAID | Attending: Nurse Practitioner | Admitting: Nurse Practitioner

## 2024-01-08 DIAGNOSIS — G894 Chronic pain syndrome: Secondary | ICD-10-CM

## 2024-01-08 DIAGNOSIS — Z91199 Patient's noncompliance with other medical treatment and regimen due to unspecified reason: Secondary | ICD-10-CM

## 2024-01-08 NOTE — Progress Notes (Signed)
 01/08/2024- I tried to call patient, however, her VM is not set up and went directly to VM. She needs to do her visit F2F

## 2024-01-12 ENCOUNTER — Ambulatory Visit: Payer: MEDICAID | Attending: Nurse Practitioner | Admitting: Nurse Practitioner

## 2024-01-12 ENCOUNTER — Encounter: Payer: Self-pay | Admitting: Nurse Practitioner

## 2024-01-12 VITALS — BP 116/68 | HR 70 | Temp 97.2°F | Resp 18 | Ht 59.0 in | Wt 87.0 lb

## 2024-01-12 DIAGNOSIS — M5416 Radiculopathy, lumbar region: Secondary | ICD-10-CM | POA: Insufficient documentation

## 2024-01-12 DIAGNOSIS — G894 Chronic pain syndrome: Secondary | ICD-10-CM | POA: Diagnosis not present

## 2024-01-12 DIAGNOSIS — M797 Fibromyalgia: Secondary | ICD-10-CM | POA: Diagnosis present

## 2024-01-12 DIAGNOSIS — S32435G Nondisplaced fracture of anterior column [iliopubic] of left acetabulum, subsequent encounter for fracture with delayed healing: Secondary | ICD-10-CM | POA: Diagnosis not present

## 2024-01-12 NOTE — Progress Notes (Signed)
 Safety precautions to be maintained throughout the outpatient stay will include: orient to surroundings, keep bed in low position, maintain call bell within reach at all times, provide assistance with transfer out of bed and ambulation.

## 2024-01-12 NOTE — Progress Notes (Signed)
 PROVIDER NOTE: Interpretation of information contained herein should be left to medically-trained personnel. Specific patient instructions are provided elsewhere under Patient Instructions section of medical record. This document was created in part using AI and STT-dictation technology, any transcriptional errors that may result from this process are unintentional.  Patient: [name]  Service: E/M   PCP: Lorel Maxie LABOR, MD  DOB: 07/10/1960  DOS: 01/12/2024  Provider: Emmy MARLA Blanch, NP  MRN: [id number]  Delivery: Face-to-face  Specialty: Interventional Pain Management  Type: Established Patient  Setting: Ambulatory outpatient facility  Specialty designation: 09  Referring Prov.: Lorel Maxie LABOR, MD  Location: Outpatient office facility       History of present illness (HPI) [name], a 63 y.o. year old female, is here today because of her Closed nondisplaced fracture of anterior column of left acetabulum with delayed healing, subsequent encounter [S32.435G]. [name]'s primary complain today is Pelvic Pain (Patient fell 12/28/2023 and broke her pelvis.Having 9/10 pain. )  Pertinent problems: [name] has Lumbar radiculopathy; Chronic bilateral low back pain with bilateral sciatica; Chronic continuous use of opioids; Chronic myofascial pain; Primary osteoarthritis of both hips; Type 2 diabetes mellitus without complication (HCC); Bilateral hand pain; and Chronic pain syndrome on their pertinent problem list  Pain Assessment: Severity of Chronic pain is reported as a 9 /10. Location: Pelvis (Pelvis) Right/Radiating under ribs on right side. Onset: 1 to 4 weeks ago. Quality: Aching, Sharp, Stabbing. Timing: Constant. Modifying factor(s): Denies. Vitals:  height is 4' 11 (1.499 m) and weight is 87 lb (39.5 kg). Her temporal temperature is 97.2 F (36.2 C) (abnormal). Her blood pressure is 116/68 and her pulse is 70. Her respiration is 18 and oxygen saturation is  100%.  BMI: Estimated body mass index is 17.57 kg/m as calculated from the following:   Height as of this encounter: 4' 11 (1.499 m).   Weight as of this encounter: 87 lb (39.5 kg).  Last encounter: 01/08/2024. Last procedure: Visit date not found.  Reason for encounter: follow-up evaluation after left hip x-ray.  The patient continues experiencing left hip pain due to acute nondisplaced fracture of the left inferior pubic ramus.  The patient presents for evaluation and to discuss management options for her nondisplaced inferior pubic ramus fracture.  We discussed referring her for an orthopedic evaluation.  Discussed the use of AI scribe software for clinical note transcription with the patient, who gave verbal consent to proceed.  History of Present Illness   [name] is a 63 year old female who presents with left hip pain due to an acute nondisplaced fracture of the left inferior pubic ramus.  She experiences significant pain and soreness in the left hip area, which affects her ability to sleep and lie on her side. The pain is severe and persistent, impacting her daily activities. Her current pain medication, hydrocodone , is not effectively managing her pain.  She also reports pain on the upper side of her body, described as soreness that prevents comfortable sleep. She cannot lie on her side due to the pain. A CT scan of the abdomen did not reveal any liver abnormalities or other significant findings, but she continues to experience pain in the rib area, which may be related to arthritis.  She has a history of aortic atherosclerosis, involving calcium  deposits. She has been receiving injections for knee pain and has a history of carpal tunnel syndrome, for which she has not had surgery or recent injections.  Pharmacotherapy Assessment   Monitoring: [place] PMP: PDMP reviewed during this encounter.       Pharmacotherapy: No side-effects or adverse reactions reported. Compliance:  No problems identified. Effectiveness: Clinically acceptable.  [name], [place]  01/12/2024  1:33 PM  Sign when Signing Visit Safety precautions to be maintained throughout the outpatient stay will include: orient to surroundings, keep bed in low position, maintain call bell within reach at all times, provide assistance with transfer out of bed and ambulation.     UDS:  Summary  Date Value Ref Range Status  12/15/2023 FINAL  Final    Comment:    ==================================================================== [employer] Select 13 (MW) ==================================================================== Specimen Alert Note: Urinary creatinine is low; ability to detect some drugs may be compromised. Interpret results with caution. (Creatinine) ==================================================================== Test                             Result       Flag       Units  Drug Present and Declared for Prescription Verification   Hydrocodone                     2858         EXPECTED   ng/mg creat   Norhydrocodone                 2558         EXPECTED   ng/mg creat    Sources of hydrocodone  include scheduled prescription medications.    Norhydrocodone is an expected metabolite of hydrocodone .  ==================================================================== Test                      Result    Flag   Units      Ref Range   Creatinine              12        LL     mg/dL      >=79 ==================================================================== Declared Medications:  The flagging and interpretation on this report are based on the  following declared medications.  Unexpected results may arise from  inaccuracies in the declared medications.   **Note: The testing scope of this panel includes these medications:   Hydrocodone  (Norco)   **Note: The testing scope of this panel does not include the  following reported medications:   Acetaminophen  (Norco)  Albuterol   (Ventolin  HFA)  Aspirin   Cephalexin  (Keflex )  Cetirizine (Zyrtec)  Docusate (Colace)  Famotidine  (Pepcid )  Linaclotide  (Linzess )  Meclizine (Antivert)  Metformin (Glucophage)  Mirtazapine  (Remeron )  Omeprazole (Prilosec)  Ondansetron   Pregabalin   Propranolol  (Inderal )  Rizatriptan (Maxalt)  Rosuvastatin  (Crestor )  Topical Lidocaine  (Lidoderm )  Topiramate  (Topamax )  Trazodone  (Desyrel )  Venlafaxine  (Effexor ) ==================================================================== For clinical consultation, please call [phone]. ====================================================================     No results found for: CBDTHCR No results found for: D8THCCBX No results found for: D9THCCBX  ROS  Constitutional: Denies any fever or chills Gastrointestinal: No reported hemesis, hematochezia, vomiting, or acute GI distress Musculoskeletal: Left hip pain Neurological: No reported episodes of acute onset apraxia, aphasia, dysarthria, agnosia, amnesia, paralysis, loss of coordination, or loss of consciousness  Medication Review  Accu-Chek Guide, HYDROcodone -acetaminophen , albuterol , aspirin , cetirizine, dextromethorphan-guaiFENesin , docusate sodium, famotidine , feeding supplement, glucose blood, lidocaine , linaclotide , meclizine, metFORMIN, mirtazapine , naproxen , omeprazole, ondansetron , pregabalin , promethazine , propranolol , rizatriptan, rosuvastatin , topiramate , and traZODone   History Review  Allergy: [name] is allergic to tramadol, benadryl [diphenhydramine hcl], bextra [  valdecoxib], invokana [canagliflozin], and sulfamethoxazole-trimethoprim. Drug: [name]  reports current drug use. Drug: Cocaine. Alcohol:  reports that she does not currently use alcohol after a past usage of about 2.0 standard drinks of alcohol per week. Tobacco:  reports that she has been smoking cigarettes. She has a 36 pack-year smoking history. She has been exposed to tobacco  smoke. She has quit using smokeless tobacco. Social: [name]  reports that she has been smoking cigarettes. She has a 36 pack-year smoking history. She has been exposed to tobacco smoke. She has quit using smokeless tobacco. She reports that she does not currently use alcohol after a past usage of about 2.0 standard drinks of alcohol per week. She reports current drug use. Drug: Cocaine. Medical:  has a past medical history of Abnormal CBC, Asthma, Asthma, BPPV (benign paroxysmal positional vertigo), Cardiac murmur, unspecified (08/28/2016), COPD (chronic obstructive pulmonary disease) (HCC), CPAP (continuous positive airway pressure) dependence, Degenerative disc disease, lumbar, Depression, Diabetes mellitus, Diverticulitis (10/2016), Elevated LFTs, Fatty liver, Fibromyalgia, Fibromyalgia, GERD (gastroesophageal reflux disease), Headache, History of degenerative disc disease, Hyperlipidemia, Hypertension, and Sleep apnea. Surgical: [name]  has a past surgical history that includes Abdominal surgery; Abdominal hysterectomy; Esophagogastroduodenoscopy (egd) with propofol  (N/A, 02/20/2017); Colonoscopy with propofol  (N/A, 02/20/2017); Upper esophageal endoscopic ultrasound (eus) (N/A, 04/03/2017); Diverticulitis; EUS (N/A, 01/08/2018); Cholecystectomy (N/A, 04/27/2018); Cataract extraction w/PHACO (Left, 09/07/2019); Cataract extraction w/PHACO (Right, 09/28/2019); and RIGHT/LEFT HEART CATH AND CORONARY ANGIOGRAPHY (Bilateral, 12/19/2021). Family: family history includes Diabetes in her mother; Hypertension in her mother.  Laboratory Chemistry Profile   Renal Lab Results  Component Value Date   BUN 18 01/05/2024   CREATININE 0.78 01/05/2024   BCR 14 11/27/2017   GFRAA >60 08/09/2019   GFRNONAA >60 01/05/2024    Hepatic Lab Results  Component Value Date   AST 16 01/05/2024   ALT 31 01/05/2024   ALBUMIN 3.6 01/05/2024   ALKPHOS 97 01/05/2024   LIPASE 28 01/05/2024   AMMONIA 46 (H)  04/05/2021    Electrolytes Lab Results  Component Value Date   NA 138 01/05/2024   K 4.7 01/05/2024   CL 99 01/05/2024   CALCIUM  9.8 01/05/2024   MG 1.4 (L) 04/19/2022    Bone No results found for: VD25OH, VD125OH2TOT, CI6874NY7, CI7874NY7, 25OHVITD1, 25OHVITD2, 25OHVITD3, TESTOFREE, TESTOSTERONE  Inflammation (CRP: Acute Phase) (ESR: Chronic Phase) Lab Results  Component Value Date   CRP <1 11/27/2017   ESRSEDRATE 17 11/27/2017   LATICACIDVEN 2.3 (HH) 12/29/2023         Note: Above Lab results reviewed.  Recent Imaging Review  CT ABDOMEN PELVIS W CONTRAST CLINICAL DATA:  Right lower quadrant abdominal pain with nausea for 4-5 days. Patient reports currently being treated for pneumonia and recent pelvic fracture.  EXAM: CT ABDOMEN AND PELVIS WITH CONTRAST  TECHNIQUE: Multidetector CT imaging of the abdomen and pelvis was performed using the standard protocol following bolus administration of intravenous contrast.  RADIATION DOSE REDUCTION: This exam was performed according to the departmental dose-optimization program which includes automated exposure control, adjustment of the mA and/or kV according to patient size and/or use of iterative reconstruction technique.  CONTRAST:  OMNIPAQUE  IOHEXOL  300 MG/ML  SOLN  COMPARISON:  CTA of the chest, abdomen and pelvis 12/29/2023. Abdominopelvic CT 04/19/2022.  FINDINGS: Lower chest: No acute findings are demonstrated at the lung bases. There is improved aeration of both lung bases with residual linear opacities consistent with atelectasis or scarring. There is no confluent airspace disease or significant pleural  effusion. The heart is mildly enlarged.  Hepatobiliary: The liver is normal in density without suspicious focal abnormality. Status post cholecystectomy. No significant biliary dilatation.  Pancreas: Unremarkable. No pancreatic ductal dilatation or surrounding inflammatory  changes.  Spleen: Normal in size without focal abnormality.  Adrenals/Urinary Tract: Both adrenal glands appear normal. No evidence of urinary tract calculus, suspicious renal lesion or hydronephrosis. The bladder appears unremarkable for its degree of distention.  Stomach/Bowel: No enteric contrast administered. The stomach appears unremarkable for its degree of distension. No evidence of bowel wall thickening, distention or surrounding inflammatory change. The appendix appears normal. There is a moderate amount of stool within the ascending and transverse colon.  Vascular/Lymphatic: There are no enlarged abdominal or pelvic lymph nodes. No acute vascular findings. Atherosclerosis of the aorta, great vessels and coronary arteries without evidence of aneurysm or large vessel occlusion.  Reproductive: Status post hysterectomy. No evidence of adnexal mass.  Other: No evidence of abdominal wall mass or hernia. No ascites or pneumoperitoneum.  Musculoskeletal: Nondisplaced acute/subacute fractures of the left sacral ala and inferior pubic ramus, new from 04/19/2022. No displaced fracture, sacroiliac joint diastasis or widening of the symphysis pubis.  IMPRESSION: 1. No acute findings or explanation for the patient's symptoms. The appendix appears normal. 2. Nondisplaced acute/subacute fractures of the left sacral ala and inferior pubic ramus, new from 04/19/2022. 3. Improved aeration of both lung bases with residual linear opacities consistent with atelectasis or scarring. 4. Interval improved aeration of the lung bases with residual linear opacities bilaterally. 5.  Aortic Atherosclerosis (ICD10-I70.0).  Electronically Signed   By: Elsie Perone M.D.   On: 01/05/2024 14:03 Note: Reviewed        Physical Exam  Vitals: BP 116/68 (BP Location: Right Arm, Patient Position: Sitting, Cuff Size: Normal)   Pulse 70   Temp (!) 97.2 F (36.2 C) (Temporal)   Resp 18   Ht 4'  11 (1.499 m)   Wt 87 lb (39.5 kg)   LMP  (LMP Unknown) Comment: many years  SpO2 100%   BMI 17.57 kg/m  BMI: Estimated body mass index is 17.57 kg/m as calculated from the following:   Height as of this encounter: 4' 11 (1.499 m).   Weight as of this encounter: 87 lb (39.5 kg). Ideal: Female patients must weigh at least 45.5 kg to calculate ideal body weight General appearance: Well nourished, well developed, and well hydrated. In no apparent acute distress Mental status: Alert, oriented x 3 (person, place, & time)       Respiratory: No evidence of acute respiratory distress Eyes: PERLA   Assessment   Diagnosis Status  1. Closed nondisplaced fracture of anterior column of left acetabulum with delayed healing, subsequent encounter   2. Chronic pain syndrome   3. Fibromyalgia   4. Lumbar radiculopathy    Controlled Controlled Controlled   Updated Problems: Problem  Nondisplaced fracture of left inferior pubic ramus    Plan of Care  Problem-specific:  Assessment and Plan    Acute nondisplaced fracture of left inferior pubic ramus Acute nondisplaced fracture identified, expected to heal without surgery. Pain likely due to fracture, managed conservatively. - Refer to orthopedics for evaluation and management. - Advise rest and conservative management.  Chronic pain Chronic pain inadequately controlled with hydrocodone . Previous injections include lumbar epidural and knee. She seeks alternative pain management.  Rib pain Rib pain with no abnormalities on imaging. Possible arthritis or musculoskeletal pain considered. - Mention rib pain to orthopedics for potential  further evaluation, including possible x-ray.       [name] has a current medication list which includes the following long-term medication(s): albuterol , famotidine , metformin, mirtazapine , omeprazole, pregabalin , propranolol , rizatriptan, topiramate , and trazodone .  Pharmacotherapy  (Medications Ordered): No orders of the defined types were placed in this encounter.  Orders:  Orders Placed This Encounter  Procedures   Ambulatory referral to Orthopedics    Referral Priority:   Routine    Referral Type:   Consultation    Referral Reason:   Specialty Services Required    Referred to Provider:   Kathlynn Sharper, MD    Number of Visits Requested:   1        Return for (PRN), Emmy Blanch NP.    Recent Visits Date Type Provider Dept  01/08/24 Office Visit Darick Fetters K, NP [facility]  12/15/23 Office Visit Tykeem Lanzer K, NP [facility]  10/22/23 Procedure visit Marcelino Nurse, MD [facility]  Showing recent visits within past 90 days and meeting all other requirements Today's Visits Date Type Provider Dept  01/12/24 Office Visit Jojuan Champney K, NP [facility]  Showing today's visits and meeting all other requirements Future Appointments Date Type Provider Dept  03/11/24 Appointment Cannon Quinton K, NP [facility]  Showing future appointments within next 90 days and meeting all other requirements  I discussed the assessment and treatment plan with the patient. The patient was provided an opportunity to ask questions and all were answered. The patient agreed with the plan and demonstrated an understanding of the instructions.  Patient advised to call back or seek an in-person evaluation if the symptoms or condition worsens.  I personally spent a total of 20 minutes in the care of the patient today including preparing to see the patient, getting/reviewing separately obtained history, performing a medically appropriate exam/evaluation, counseling and educating, placing orders, referring and communicating with other health care professionals, documenting clinical information in the EHR, independently interpreting results, communicating results, and coordinating care.  Note by: Tuyen Uncapher K Marianna Cid, NP (TTS and AI technology  used. I apologize for any typographical errors that were not detected and corrected.) Date: 01/12/2024; Time: 2:50 PM

## 2024-02-24 ENCOUNTER — Telehealth: Payer: Self-pay | Admitting: Student in an Organized Health Care Education/Training Program

## 2024-02-24 NOTE — Telephone Encounter (Signed)
 Spoke with someone at [facility], they told me that today, 02-24-24, patient filled a script from Dr. Arlander for Hydrocodone  #20, that was written 01-05-24.   Last fill for Hydrocodone  from [facility] was 01-21-24.  I informed them that patient intended to fill the one from [facility] for #90, not the one for #20.  She then transferred me to the local [facility], I explained the situation to the person who answered the phone. That person told me she is not authorized to  manage C2 medications, she would transfer me to a pharmacist.   After a few minutes, the call was disconnected, I did not get to speak to the pharmacist.   This entire event was 30 minutes. I called patient, explained to her my converstion, I asked her to call the pharmacy and let them know she intended to fill the Rx for #90.

## 2024-02-24 NOTE — Telephone Encounter (Signed)
 Pt stated that she had picked up 20 pills and she doesn't think she will be able to pickup her 90 so she wanted to know could she get her 84 because they gave her the wrong one.They supposed to have gave her the 90. Hydrocodone .

## 2024-02-25 ENCOUNTER — Telehealth: Payer: Self-pay | Admitting: Pain Medicine

## 2024-02-25 NOTE — Telephone Encounter (Signed)
 [facility] pharmacy called for Rx clarification.  They have an Rx written on 12/15/23 to fill on 02/20/24 and also an Rx for hydro - apap 7.5-325 mg every 6 hours prn pain from Dr Arlander, 5 day supply.  Pharmacy was asking if they should hold our Rx for qty 90 of same medicine or go ahead and fill. I told  them they could fill and I would call patient and see what is going on and why the additional medication was prescribed.    Unable to reach [place] and no voicemail capability.

## 2024-02-25 NOTE — Telephone Encounter (Signed)
Pharmacy needs clarification on meds

## 2024-03-11 ENCOUNTER — Encounter: Payer: MEDICAID | Admitting: Nurse Practitioner

## 2024-03-18 NOTE — Progress Notes (Signed)
 PROVIDER NOTE: Interpretation of information contained herein should be left to medically-trained personnel. Specific patient instructions are provided elsewhere under Patient Instructions section of medical record. This document was created in part using AI and STT-dictation technology, any transcriptional errors that may result from this process are unintentional.  Patient: [name]  Service: E/M   PCP: [facility]  DOB: June 18, 1964  DOS: 03/22/2024  Provider: Emmy MARLA Blanch, NP  MRN: [id number]  Delivery: Face-to-face  Specialty: Interventional Pain Management  Type: Established Patient  Setting: Ambulatory outpatient facility  Specialty designation: 09  Referring Prov.: [facility]  Location: Outpatient office facility       History of present illness (HPI) [name], a 63 y.o. year old female, is here today because of her No primary diagnosis found.. [name]'s primary complain today is No chief complaint on file.  Pertinent problems: [name] has Toxic encephalopathy; Medication-induced delirium, acute, hyperactive; Asthma; Chronic pain syndrome; Chronic migraine w/o aura w/o status migrainosus, not intractable; Post laminectomy syndrome; Spinal stenosis, cervical region; Polypharmacy; History of psychoactive substance use disorder; Bilateral occipital neuralgia; Status post cervical spinal fusion; and Medication management on their pertinent problem list.  Pain Assessment: Severity of   is reported as a  /10. Location:    / . Onset:  . Quality:  . Timing:  . Modifying factor(s):  SABRA Vitals:  vitals were not taken for this visit.  BMI: Estimated body mass index is 28.84 kg/m as calculated from the following:   Height as of 02/25/24: 5' 4 (1.626 m).   Weight as of 02/25/24: 168 lb (76.2 kg).  Last encounter: 02/25/2024. Last procedure: Visit date not found.  Reason for encounter:  *** .   Discussed the use of AI scribe software for clinical note  transcription with the patient, who gave verbal consent to proceed.  History of Present Illness           Pharmacotherapy Assessment   Buprenorphine  HCl-naloxone  HCl (Suboxone ) 8-2 mg film daily under the tongue. Gabapentin  (Neurontin ) 100 mg capsule daily at bedtime for neuropathy pain Monitoring: [place] PMP: PDMP reviewed during this encounter.       Pharmacotherapy: No side-effects or adverse reactions reported. Compliance: No problems identified. Effectiveness: Clinically acceptable.  [name], [place]  02/25/2024  9:34 AM  Sign when Signing Visit Nursing Pain Medication Assessment:  Safety precautions to be maintained throughout the outpatient stay will include: orient to surroundings, keep bed in low position, maintain call bell within reach at all times, provide assistance with transfer out of bed and ambulation.  Medication Inspection Compliance: [name] did not comply with our request to bring her pills to be counted. She was reminded that bringing the medication bottles, even when empty, is a requirement.  Medication: None brought in. Pill/Patch Count: None available to be counted. Bottle Appearance: No container available. Did not bring bottle(s) to appointment. Filled Date: N/A Last Medication intake:  Today  Patient reminded to bring to each appointment     UDS:  No results found for: SUMMARY  No results found for: CBDTHCR No results found for: D8THCCBX No results found for: D9THCCBX  ROS  Constitutional: Denies any fever or chills Gastrointestinal: No reported hemesis, hematochezia, vomiting, or acute GI distress Musculoskeletal: Neck pain, reduced range of motion Neurological: No reported episodes of acute onset apraxia, aphasia, dysarthria, agnosia, amnesia, paralysis, loss of coordination, or loss of consciousness  Medication Review  Buprenorphine  HCl-Naloxone  HCl, Oxycodone HCl,

## 2024-03-22 ENCOUNTER — Ambulatory Visit (HOSPITAL_BASED_OUTPATIENT_CLINIC_OR_DEPARTMENT_OTHER): Payer: MEDICAID | Admitting: Nurse Practitioner

## 2024-03-22 DIAGNOSIS — G894 Chronic pain syndrome: Secondary | ICD-10-CM

## 2024-03-22 DIAGNOSIS — M1711 Unilateral primary osteoarthritis, right knee: Secondary | ICD-10-CM

## 2024-03-22 DIAGNOSIS — Z91199 Patient's noncompliance with other medical treatment and regimen due to unspecified reason: Secondary | ICD-10-CM

## 2024-03-22 DIAGNOSIS — G8929 Other chronic pain: Secondary | ICD-10-CM

## 2024-03-22 DIAGNOSIS — M5416 Radiculopathy, lumbar region: Secondary | ICD-10-CM

## 2024-03-22 DIAGNOSIS — M797 Fibromyalgia: Secondary | ICD-10-CM

## 2024-03-22 DIAGNOSIS — F1190 Opioid use, unspecified, uncomplicated: Secondary | ICD-10-CM

## 2024-03-22 DIAGNOSIS — E1140 Type 2 diabetes mellitus with diabetic neuropathy, unspecified: Secondary | ICD-10-CM

## 2024-03-22 DIAGNOSIS — M1712 Unilateral primary osteoarthritis, left knee: Secondary | ICD-10-CM

## 2024-03-31 NOTE — Progress Notes (Unsigned)
 PROVIDER NOTE: Interpretation of information contained herein should be left to medically-trained personnel. Specific patient instructions are provided elsewhere under Patient Instructions section of medical record. This document was created in part using AI and STT-dictation technology, any transcriptional errors that may result from this process are unintentional.  Patient: [name]  Service: E/M   PCP: Lorel Maxie LABOR, MD  DOB: February 24, 1961  DOS: 04/01/2024  Provider: Emmy MARLA Blanch, NP  MRN: [id number]  Delivery: Face-to-face  Specialty: Interventional Pain Management  Type: Established Patient  Setting: Ambulatory outpatient facility  Specialty designation: 09  Referring Prov.: Lorel Maxie LABOR, MD  Location: Outpatient office facility       History of present illness (HPI) [name], a 64 y.o. year old female, is here today because of her No primary diagnosis found.. [name] primary complain today is No chief complaint on file.  Pertinent problems: [name] has Lumbar radiculopathy; Chronic bilateral low back pain with bilateral sciatica; Chronic pain syndrome; Chronic, continuous use of opioids; Chronic myofascial pain; DDD (degenerative disc disease), lumbar; Primary osteoarthritis of both hips; Type 2 diabetes mellitus without complications (HCC); Pharmacologic therapy; Disorder of skeletal system; Problems influencing health status; Chronic constipation; Pelvic pain in female; Pain in both hands; Primary osteoarthritis of right knee; Opiate overdose, accidental or unintentional, sequela; Opioid overdose (HCC); Acute respiratory failure with hypoxia (HCC); Chronic left shoulder pain; Chronic painful diabetic neuropathy (HCC); Primary osteoarthritis of right shoulder; Chronic right shoulder pain; Chronic diastolic CHF (congestive heart failure) (HCC); and Diabetes mellitus without complication (HCC) on their pertinent problem list.  Pain Assessment: Severity  of   is reported as a  /10. Location:    / . Onset:  . Quality:  . Timing:  . Modifying factor(s):  SABRA Vitals:  vitals were not taken for this visit.  BMI: Estimated body mass index is 17.57 kg/m as calculated from the following:   Height as of 01/12/24: 4' 11 (1.499 m).   Weight as of 01/12/24: 87 lb (39.5 kg).  Last encounter: 01/12/2024. Last procedure: Visit date not found.  Reason for encounter:  *** .   Discussed the use of AI scribe software for clinical note transcription with the patient, who gave verbal consent to proceed.  History of Present Illness           Pharmacotherapy Assessment   Monitoring: [place] PMP: PDMP not reviewed this encounter.       Pharmacotherapy: No side-effects or adverse reactions reported. Compliance: No problems identified. Effectiveness: Clinically acceptable.  No notes on file  UDS:  Summary  Date Value Ref Range Status  12/15/2023 FINAL  Final    Comment:    ==================================================================== [employer] [facility] 13 (MW) ==================================================================== Specimen Alert Note: Urinary creatinine is low; ability to detect some drugs may be compromised. Interpret results with caution. (Creatinine) ==================================================================== Test                             Result       Flag       Units  Drug Present and Declared for Prescription Verification   Hydrocodone                     2858         EXPECTED   ng/mg creat   Norhydrocodone                 2558  EXPECTED   ng/mg creat    Sources of hydrocodone  include scheduled prescription medications.    Norhydrocodone is an expected metabolite of hydrocodone .  ==================================================================== Test                      Result    Flag   Units      Ref Range   Creatinine              12        LL     mg/dL       >=79 ==================================================================== Declared Medications:  The flagging and interpretation on this report are based on the  following declared medications.  Unexpected results may arise from  inaccuracies in the declared medications.   **Note: The testing scope of this panel includes these medications:   Hydrocodone  (Norco)   **Note: The testing scope of this panel does not include the  following reported medications:   Acetaminophen  (Norco)  Albuterol  (Ventolin  HFA)  Aspirin   Cephalexin  (Keflex )  Cetirizine (Zyrtec)  Docusate (Colace)  Famotidine  (Pepcid )  Linaclotide  (Linzess )  Meclizine (Antivert)  Metformin (Glucophage)  Mirtazapine  (Remeron )  Omeprazole (Prilosec)  Ondansetron   Pregabalin   Propranolol  (Inderal )  Rizatriptan (Maxalt)  Rosuvastatin  (Crestor )  Topical Lidocaine  (Lidoderm )  Topiramate  (Topamax )  Trazodone  (Desyrel )  Venlafaxine  (Effexor ) ==================================================================== For clinical consultation, please call [phone]. ====================================================================     No results found for: CBDTHCR No results found for: D8THCCBX No results found for: D9THCCBX  ROS  Constitutional: Denies any fever or chills Gastrointestinal: No reported hemesis, hematochezia, vomiting, or acute GI distress Musculoskeletal: Denies any acute onset joint swelling, redness, loss of ROM, or weakness Neurological: No reported episodes of acute onset apraxia, aphasia, dysarthria, agnosia, amnesia, paralysis, loss of coordination, or loss of consciousness  Medication Review  Accu-Chek Guide, albuterol , aspirin , cetirizine, dextromethorphan-guaiFENesin , docusate sodium, famotidine , feeding supplement, glucose blood, lidocaine , linaclotide , meclizine, metFORMIN, mirtazapine , naproxen , omeprazole, ondansetron , pregabalin , promethazine , propranolol , rizatriptan,  rosuvastatin , topiramate , and traZODone   History Review  Allergy: [name] is allergic to tramadol, benadryl [diphenhydramine hcl], bextra [valdecoxib], invokana [canagliflozin], and sulfamethoxazole-trimethoprim. Drug: [name]  reports current drug use. Drug: Cocaine. Alcohol:  reports that she does not currently use alcohol after a past usage of about 2.0 standard drinks of alcohol per week. Tobacco:  reports that she has been smoking cigarettes. She has a 36 pack-year smoking history. She has been exposed to tobacco smoke. She has quit using smokeless tobacco. Social: [name]  reports that she has been smoking cigarettes. She has a 36 pack-year smoking history. She has been exposed to tobacco smoke. She has quit using smokeless tobacco. She reports that she does not currently use alcohol after a past usage of about 2.0 standard drinks of alcohol per week. She reports current drug use. Drug: Cocaine. Medical:  has a past medical history of Abnormal CBC, Asthma, Asthma, BPPV (benign paroxysmal positional vertigo), Cardiac murmur, unspecified (08/28/2016), COPD (chronic obstructive pulmonary disease) (HCC), CPAP (continuous positive airway pressure) dependence, Degenerative disc disease, lumbar, Depression, Diabetes mellitus, Diverticulitis (10/2016), Elevated LFTs, Fatty liver, Fibromyalgia, Fibromyalgia, GERD (gastroesophageal reflux disease), Headache, History of degenerative disc disease, Hyperlipidemia, Hypertension, and Sleep apnea. Surgical: [name]  has a past surgical history that includes Abdominal surgery; Abdominal hysterectomy; Esophagogastroduodenoscopy (egd) with propofol  (N/A, 02/20/2017); Colonoscopy with propofol  (N/A, 02/20/2017); Upper esophageal endoscopic ultrasound (eus) (N/A, 04/03/2017); Diverticulitis; EUS (N/A, 01/08/2018); Cholecystectomy (N/A, 04/27/2018); Cataract extraction w/PHACO (Left, 09/07/2019); Cataract extraction w/PHACO (Right, 09/28/2019);  and RIGHT/LEFT  HEART CATH AND CORONARY ANGIOGRAPHY (Bilateral, 12/19/2021). Family: family history includes Diabetes in her mother; Hypertension in her mother.  Laboratory Chemistry Profile   Renal Lab Results  Component Value Date   BUN 18 01/05/2024   CREATININE 0.78 01/05/2024   BCR 14 11/27/2017   GFRAA >60 08/09/2019   GFRNONAA >60 01/05/2024    Hepatic Lab Results  Component Value Date   AST 16 01/05/2024   ALT 31 01/05/2024   ALBUMIN 3.6 01/05/2024   ALKPHOS 97 01/05/2024   LIPASE 28 01/05/2024   AMMONIA 46 (H) 04/05/2021    Electrolytes Lab Results  Component Value Date   NA 138 01/05/2024   K 4.7 01/05/2024   CL 99 01/05/2024   CALCIUM  9.8 01/05/2024   MG 1.4 (L) 04/19/2022    Bone No results found for: VD25OH, VD125OH2TOT, CI6874NY7, CI7874NY7, 25OHVITD1, 25OHVITD2, 25OHVITD3, TESTOFREE, TESTOSTERONE  Inflammation (CRP: Acute Phase) (ESR: Chronic Phase) Lab Results  Component Value Date   CRP <1 11/27/2017   ESRSEDRATE 17 11/27/2017   LATICACIDVEN 2.3 (HH) 12/29/2023         Note: Above Lab results reviewed.  Recent Imaging Review  CT ABDOMEN PELVIS W CONTRAST CLINICAL DATA:  Right lower quadrant abdominal pain with nausea for 4-5 days. Patient reports currently being treated for pneumonia and recent pelvic fracture.  EXAM: CT ABDOMEN AND PELVIS WITH CONTRAST  TECHNIQUE: Multidetector CT imaging of the abdomen and pelvis was performed using the standard protocol following bolus administration of intravenous contrast.  RADIATION DOSE REDUCTION: This exam was performed according to the departmental dose-optimization program which includes automated exposure control, adjustment of the mA and/or kV according to patient size and/or use of iterative reconstruction technique.  CONTRAST:  OMNIPAQUE  IOHEXOL  300 MG/ML  SOLN  COMPARISON:  CTA of the chest, abdomen and pelvis 12/29/2023. Abdominopelvic CT  04/19/2022.  FINDINGS: Lower chest: No acute findings are demonstrated at the lung bases. There is improved aeration of both lung bases with residual linear opacities consistent with atelectasis or scarring. There is no confluent airspace disease or significant pleural effusion. The heart is mildly enlarged.  Hepatobiliary: The liver is normal in density without suspicious focal abnormality. Status post cholecystectomy. No significant biliary dilatation.  Pancreas: Unremarkable. No pancreatic ductal dilatation or surrounding inflammatory changes.  Spleen: Normal in size without focal abnormality.  Adrenals/Urinary Tract: Both adrenal glands appear normal. No evidence of urinary tract calculus, suspicious renal lesion or hydronephrosis. The bladder appears unremarkable for its degree of distention.  Stomach/Bowel: No enteric contrast administered. The stomach appears unremarkable for its degree of distension. No evidence of bowel wall thickening, distention or surrounding inflammatory change. The appendix appears normal. There is a moderate amount of stool within the ascending and transverse colon.  Vascular/Lymphatic: There are no enlarged abdominal or pelvic lymph nodes. No acute vascular findings. Atherosclerosis of the aorta, great vessels and coronary arteries without evidence of aneurysm or large vessel occlusion.  Reproductive: Status post hysterectomy. No evidence of adnexal mass.  Other: No evidence of abdominal wall mass or hernia. No ascites or pneumoperitoneum.  Musculoskeletal: Nondisplaced acute/subacute fractures of the left sacral ala and inferior pubic ramus, new from 04/19/2022. No displaced fracture, sacroiliac joint diastasis or widening of the symphysis pubis.  IMPRESSION: 1. No acute findings or explanation for the patient's symptoms. The appendix appears normal. 2. Nondisplaced acute/subacute fractures of the left sacral ala and inferior pubic  ramus, new from 04/19/2022. 3. Improved aeration of both lung bases with residual linear  opacities consistent with atelectasis or scarring. 4. Interval improved aeration of the lung bases with residual linear opacities bilaterally. 5.  Aortic Atherosclerosis (ICD10-I70.0).  Electronically Signed   By: Elsie Perone M.D.   On: 01/05/2024 14:03 Note: Reviewed        Physical Exam  Vitals: LMP  (LMP Unknown) Comment: many years BMI: Estimated body mass index is 17.57 kg/m as calculated from the following:   Height as of 01/12/24: 4' 11 (1.499 m).   Weight as of 01/12/24: 87 lb (39.5 kg). Ideal: Patient weight not recorded General appearance: Well nourished, well developed, and well hydrated. In no apparent acute distress Mental status: Alert, oriented x 3 (person, place, & time)       Respiratory: No evidence of acute respiratory distress Eyes: PERLA   Assessment   Diagnosis Status  No diagnosis found. Controlled Controlled Controlled   Updated Problems: No problems updated.  Plan of Care  Problem-specific:  Assessment and Plan            [name] has a current medication list which includes the following long-term medication(s): albuterol , famotidine , metformin, mirtazapine , omeprazole, pregabalin , propranolol , rizatriptan, topiramate , and trazodone .  Pharmacotherapy (Medications Ordered): No orders of the defined types were placed in this encounter.  Orders:  No orders of the defined types were placed in this encounter.    {There is no content from the last Plan section.}   No follow-ups on file.    Recent Visits Date Type Provider Dept  01/12/24 Office Visit Nathan Moctezuma K, NP [facility]  01/08/24 Office Visit Elloise Roark K, NP [facility]  Showing recent visits within past 90 days and meeting all other requirements Future Appointments Date Type Provider Dept  04/01/24 Appointment Jaida Basurto K, NP [facility]  Showing future appointments within next 90 days and meeting all other requirements  I discussed the assessment and treatment plan with the patient. The patient was provided an opportunity to ask questions and all were answered. The patient agreed with the plan and demonstrated an understanding of the instructions.  Patient advised to call back or seek an in-person evaluation if the symptoms or condition worsens.  Duration of encounter: *** minutes.  Total time on encounter, as per AMA guidelines included both the face-to-face and non-face-to-face time personally spent by the physician and/or other qualified health care professional(s) on the day of the encounter (includes time in activities that require the physician or other qualified health care professional and does not include time in activities normally performed by clinical staff). Physician's time may include the following activities when performed: Preparing to see the patient (e.g., pre-charting review of records, searching for previously ordered imaging, lab work, and nerve conduction tests) Review of prior analgesic pharmacotherapies. Reviewing PMP Interpreting ordered tests (e.g., lab work, imaging, nerve conduction tests) Performing post-procedure evaluations, including interpretation of diagnostic procedures Obtaining and/or reviewing separately obtained history Performing a medically appropriate examination and/or evaluation Counseling and educating the patient/family/caregiver Ordering medications, tests, or procedures Referring and communicating with other health care professionals (when not separately reported) Documenting clinical information in the electronic or other health record Independently interpreting results (not separately reported) and communicating results to the patient/ family/caregiver Care coordination (not separately reported)  Note by: Abdulloh Ullom K Kamyiah Colantonio, NP (TTS and AI technology used. I apologize  for any typographical errors that were not detected and corrected.) Date: 04/01/2024; Time: 8:08 AM

## 2024-04-01 ENCOUNTER — Encounter: Payer: MEDICAID | Admitting: Nurse Practitioner

## 2024-04-01 NOTE — Progress Notes (Unsigned)
 PROVIDER NOTE: Interpretation of information contained herein should be left to medically-trained personnel. Specific patient instructions are provided elsewhere under Patient Instructions section of medical record. This document was created in part using AI and STT-dictation technology, any transcriptional errors that may result from this process are unintentional.  Patient: [name]  Service: E/M   PCP: Lorel Maxie LABOR, MD  DOB: 1961/02/14  DOS: 04/05/2024  Provider: Emmy MARLA Blanch, NP  MRN: [id number]  Delivery: Face-to-face  Specialty: Interventional Pain Management  Type: Established Patient  Setting: Ambulatory outpatient facility  Specialty designation: 09  Referring Prov.: Lorel Maxie LABOR, MD  Location: Outpatient office facility       History of present illness (HPI) [name], a 64 y.o. year old female, is here today because of her No primary diagnosis found.. [name]'s primary complain today is No chief complaint on file.  Pertinent problems: [name] has Lumbar radiculopathy; Chronic bilateral low back pain with bilateral sciatica; Chronic pain syndrome; Chronic, continuous use of opioids; Chronic myofascial pain; DDD (degenerative disc disease), lumbar; Primary osteoarthritis of both hips; Type 2 diabetes mellitus without complications (HCC); Pharmacologic therapy; Disorder of skeletal system; Problems influencing health status; Chronic constipation; Pelvic pain in female; Pain in both hands; Primary osteoarthritis of right knee; Opiate overdose, accidental or unintentional, sequela; Opioid overdose (HCC); Acute respiratory failure with hypoxia (HCC); Chronic left shoulder pain; Chronic painful diabetic neuropathy (HCC); Primary osteoarthritis of right shoulder; Chronic right shoulder pain; Chronic diastolic CHF (congestive heart failure) (HCC); and Diabetes mellitus without complication (HCC) on their pertinent problem list.  Pain Assessment: Severity  of   is reported as a  /10. Location:    / . Onset:  . Quality:  . Timing:  . Modifying factor(s):  SABRA Vitals:  vitals were not taken for this visit.  BMI: Estimated body mass index is 17.57 kg/m as calculated from the following:   Height as of 01/12/24: 4' 11 (1.499 m).   Weight as of 01/12/24: 87 lb (39.5 kg).  Last encounter: 01/12/2024. Last procedure: Visit date not found.  Reason for encounter:  *** .   Discussed the use of AI scribe software for clinical note transcription with the patient, who gave verbal consent to proceed.  History of Present Illness           Pharmacotherapy Assessment   Monitoring: [place] PMP: PDMP not reviewed this encounter.       Pharmacotherapy: No side-effects or adverse reactions reported. Compliance: No problems identified. Effectiveness: Clinically acceptable.  No notes on file  UDS:  Summary  Date Value Ref Range Status  12/15/2023 FINAL  Final    Comment:    ==================================================================== [employer] Select 13 (MW) ==================================================================== Specimen Alert Note: Urinary creatinine is low; ability to detect some drugs may be compromised. Interpret results with caution. (Creatinine) ==================================================================== Test                             Result       Flag       Units  Drug Present and Declared for Prescription Verification   Hydrocodone                     2858         EXPECTED   ng/mg creat   Norhydrocodone                 2558  EXPECTED   ng/mg creat    Sources of hydrocodone  include scheduled prescription medications.    Norhydrocodone is an expected metabolite of hydrocodone .  ==================================================================== Test                      Result    Flag   Units      Ref Range   Creatinine              12        LL     mg/dL       >=79 ==================================================================== Declared Medications:  The flagging and interpretation on this report are based on the  following declared medications.  Unexpected results may arise from  inaccuracies in the declared medications.   **Note: The testing scope of this panel includes these medications:   Hydrocodone  (Norco)   **Note: The testing scope of this panel does not include the  following reported medications:   Acetaminophen  (Norco)  Albuterol  (Ventolin  HFA)  Aspirin   Cephalexin  (Keflex )  Cetirizine (Zyrtec)  Docusate (Colace)  Famotidine  (Pepcid )  Linaclotide  (Linzess )  Meclizine (Antivert)  Metformin (Glucophage)  Mirtazapine  (Remeron )  Omeprazole (Prilosec)  Ondansetron   Pregabalin   Propranolol  (Inderal )  Rizatriptan (Maxalt)  Rosuvastatin  (Crestor )  Topical Lidocaine  (Lidoderm )  Topiramate  (Topamax )  Trazodone  (Desyrel )  Venlafaxine  (Effexor ) ==================================================================== For clinical consultation, please call [phone]. ====================================================================     No results found for: CBDTHCR No results found for: D8THCCBX No results found for: D9THCCBX  ROS  Constitutional: Denies any fever or chills Gastrointestinal: No reported hemesis, hematochezia, vomiting, or acute GI distress Musculoskeletal: Denies any acute onset joint swelling, redness, loss of ROM, or weakness Neurological: No reported episodes of acute onset apraxia, aphasia, dysarthria, agnosia, amnesia, paralysis, loss of coordination, or loss of consciousness  Medication Review  Accu-Chek Guide, HYDROcodone -acetaminophen , albuterol , aspirin , cetirizine, dextromethorphan-guaiFENesin , docusate sodium, famotidine , feeding supplement, glucose blood, lidocaine , linaclotide , meclizine, metFORMIN, mirtazapine , naproxen , omeprazole, ondansetron , pregabalin , promethazine ,  propranolol , rizatriptan, rosuvastatin , topiramate , and traZODone   History Review  Allergy: [name] is allergic to tramadol, benadryl [diphenhydramine hcl], bextra [valdecoxib], invokana [canagliflozin], and sulfamethoxazole-trimethoprim. Drug: Ms. Stach  reports current drug use. Drug: Cocaine. Alcohol:  reports that she does not currently use alcohol after a past usage of about 2.0 standard drinks of alcohol per week. Tobacco:  reports that she has been smoking cigarettes. She has a 36 pack-year smoking history. She has been exposed to tobacco smoke. She has quit using smokeless tobacco. Social: Ms. Helmkamp  reports that she has been smoking cigarettes. She has a 36 pack-year smoking history. She has been exposed to tobacco smoke. She has quit using smokeless tobacco. She reports that she does not currently use alcohol after a past usage of about 2.0 standard drinks of alcohol per week. She reports current drug use. Drug: Cocaine. Medical:  has a past medical history of Abnormal CBC, Asthma, Asthma, BPPV (benign paroxysmal positional vertigo), Cardiac murmur, unspecified (08/28/2016), COPD (chronic obstructive pulmonary disease) (HCC), CPAP (continuous positive airway pressure) dependence, Degenerative disc disease, lumbar, Depression, Diabetes mellitus, Diverticulitis (10/2016), Elevated LFTs, Fatty liver, Fibromyalgia, Fibromyalgia, GERD (gastroesophageal reflux disease), Headache, History of degenerative disc disease, Hyperlipidemia, Hypertension, and Sleep apnea. Surgical: Ms. Tapley  has a past surgical history that includes Abdominal surgery; Abdominal hysterectomy; Esophagogastroduodenoscopy (egd) with propofol  (N/A, 02/20/2017); Colonoscopy with propofol  (N/A, 02/20/2017); Upper esophageal endoscopic ultrasound (eus) (N/A, 04/03/2017); Diverticulitis; EUS (N/A, 01/08/2018); Cholecystectomy (N/A, 04/27/2018); Cataract extraction w/PHACO (Left, 09/07/2019); Cataract extraction  w/PHACO (Right, 09/28/2019); and  RIGHT/LEFT HEART CATH AND CORONARY ANGIOGRAPHY (Bilateral, 12/19/2021). Family: family history includes Diabetes in her mother; Hypertension in her mother.  Laboratory Chemistry Profile   Renal Lab Results  Component Value Date   BUN 18 01/05/2024   CREATININE 0.78 01/05/2024   BCR 14 11/27/2017   GFRAA >60 08/09/2019   GFRNONAA >60 01/05/2024    Hepatic Lab Results  Component Value Date   AST 16 01/05/2024   ALT 31 01/05/2024   ALBUMIN 3.6 01/05/2024   ALKPHOS 97 01/05/2024   LIPASE 28 01/05/2024   AMMONIA 46 (H) 04/05/2021    Electrolytes Lab Results  Component Value Date   NA 138 01/05/2024   K 4.7 01/05/2024   CL 99 01/05/2024   CALCIUM  9.8 01/05/2024   MG 1.4 (L) 04/19/2022    Bone No results found for: VD25OH, VD125OH2TOT, CI6874NY7, CI7874NY7, 25OHVITD1, 25OHVITD2, 25OHVITD3, TESTOFREE, TESTOSTERONE  Inflammation (CRP: Acute Phase) (ESR: Chronic Phase) Lab Results  Component Value Date   CRP <1 11/27/2017   ESRSEDRATE 17 11/27/2017   LATICACIDVEN 2.3 (HH) 12/29/2023         Note: Above Lab results reviewed.  Recent Imaging Review  CT ABDOMEN PELVIS W CONTRAST CLINICAL DATA:  Right lower quadrant abdominal pain with nausea for 4-5 days. Patient reports currently being treated for pneumonia and recent pelvic fracture.  EXAM: CT ABDOMEN AND PELVIS WITH CONTRAST  TECHNIQUE: Multidetector CT imaging of the abdomen and pelvis was performed using the standard protocol following bolus administration of intravenous contrast.  RADIATION DOSE REDUCTION: This exam was performed according to the departmental dose-optimization program which includes automated exposure control, adjustment of the mA and/or kV according to patient size and/or use of iterative reconstruction technique.  CONTRAST:  100mL OMNIPAQUE  IOHEXOL  300 MG/ML  SOLN  COMPARISON:  CTA of the chest, abdomen and pelvis 12/29/2023. Abdominopelvic  CT 04/19/2022.  FINDINGS: Lower chest: No acute findings are demonstrated at the lung bases. There is improved aeration of both lung bases with residual linear opacities consistent with atelectasis or scarring. There is no confluent airspace disease or significant pleural effusion. The heart is mildly enlarged.  Hepatobiliary: The liver is normal in density without suspicious focal abnormality. Status post cholecystectomy. No significant biliary dilatation.  Pancreas: Unremarkable. No pancreatic ductal dilatation or surrounding inflammatory changes.  Spleen: Normal in size without focal abnormality.  Adrenals/Urinary Tract: Both adrenal glands appear normal. No evidence of urinary tract calculus, suspicious renal lesion or hydronephrosis. The bladder appears unremarkable for its degree of distention.  Stomach/Bowel: No enteric contrast administered. The stomach appears unremarkable for its degree of distension. No evidence of bowel wall thickening, distention or surrounding inflammatory change. The appendix appears normal. There is a moderate amount of stool within the ascending and transverse colon.  Vascular/Lymphatic: There are no enlarged abdominal or pelvic lymph nodes. No acute vascular findings. Atherosclerosis of the aorta, great vessels and coronary arteries without evidence of aneurysm or large vessel occlusion.  Reproductive: Status post hysterectomy. No evidence of adnexal mass.  Other: No evidence of abdominal wall mass or hernia. No ascites or pneumoperitoneum.  Musculoskeletal: Nondisplaced acute/subacute fractures of the left sacral ala and inferior pubic ramus, new from 04/19/2022. No displaced fracture, sacroiliac joint diastasis or widening of the symphysis pubis.  IMPRESSION: 1. No acute findings or explanation for the patient's symptoms. The appendix appears normal. 2. Nondisplaced acute/subacute fractures of the left sacral ala and inferior pubic  ramus, new from 04/19/2022. 3. Improved aeration of both lung bases with residual  linear opacities consistent with atelectasis or scarring. 4. Interval improved aeration of the lung bases with residual linear opacities bilaterally. 5.  Aortic Atherosclerosis (ICD10-I70.0).  Electronically Signed   By: Elsie Perone M.D.   On: 01/05/2024 14:03 Note: Reviewed        Physical Exam  Vitals: LMP  (LMP Unknown) Comment: many years BMI: Estimated body mass index is 17.57 kg/m as calculated from the following:   Height as of 01/12/24: 4' 11 (1.499 m).   Weight as of 01/12/24: 87 lb (39.5 kg). Ideal: Patient weight not recorded General appearance: Well nourished, well developed, and well hydrated. In no apparent acute distress Mental status: Alert, oriented x 3 (person, place, & time)       Respiratory: No evidence of acute respiratory distress Eyes: PERLA   Assessment   Diagnosis Status  No diagnosis found. Controlled Controlled Controlled   Updated Problems: No problems updated.  Plan of Care  Problem-specific:  Assessment and Plan            Ms. Kariel Skillman Janney has a current medication list which includes the following long-term medication(s): albuterol , famotidine , metformin, mirtazapine , omeprazole, pregabalin , propranolol , rizatriptan, topiramate , and trazodone .  Pharmacotherapy (Medications Ordered): No orders of the defined types were placed in this encounter.  Orders:  No orders of the defined types were placed in this encounter.       No follow-ups on file.    Recent Visits Date Type Provider Dept  01/12/24 Office Visit Joselin Crandell K, NP [facility]  01/08/24 Office Visit Filbert Craze K, NP [facility]  Showing recent visits within past 90 days and meeting all other requirements Future Appointments Date Type Provider Dept  04/05/24 Appointment Shyia Fillingim K, NP [facility]  Showing future appointments within  next 90 days and meeting all other requirements  I discussed the assessment and treatment plan with the patient. The patient was provided an opportunity to ask questions and all were answered. The patient agreed with the plan and demonstrated an understanding of the instructions.  Patient advised to call back or seek an in-person evaluation if the symptoms or condition worsens.  Duration of encounter: *** minutes.  Total time on encounter, as per AMA guidelines included both the face-to-face and non-face-to-face time personally spent by the physician and/or other qualified health care professional(s) on the day of the encounter (includes time in activities that require the physician or other qualified health care professional and does not include time in activities normally performed by clinical staff). Physician's time may include the following activities when performed: Preparing to see the patient (e.g., pre-charting review of records, searching for previously ordered imaging, lab work, and nerve conduction tests) Review of prior analgesic pharmacotherapies. Reviewing PMP Interpreting ordered tests (e.g., lab work, imaging, nerve conduction tests) Performing post-procedure evaluations, including interpretation of diagnostic procedures Obtaining and/or reviewing separately obtained history Performing a medically appropriate examination and/or evaluation Counseling and educating the patient/family/caregiver Ordering medications, tests, or procedures Referring and communicating with other health care professionals (when not separately reported) Documenting clinical information in the electronic or other health record Independently interpreting results (not separately reported) and communicating results to the patient/ family/caregiver Care coordination (not separately reported)  Note by: Mintie Witherington K Milah Recht, NP (TTS and AI technology used. I apologize for any typographical errors that were not  detected and corrected.) Date: 04/05/2024; Time: 9:33 AM

## 2024-04-05 ENCOUNTER — Ambulatory Visit (HOSPITAL_BASED_OUTPATIENT_CLINIC_OR_DEPARTMENT_OTHER): Payer: MEDICAID | Admitting: Nurse Practitioner

## 2024-04-05 DIAGNOSIS — G894 Chronic pain syndrome: Secondary | ICD-10-CM

## 2024-04-05 DIAGNOSIS — Z91199 Patient's noncompliance with other medical treatment and regimen due to unspecified reason: Secondary | ICD-10-CM

## 2024-04-05 NOTE — Progress Notes (Unsigned)
 PROVIDER NOTE: Interpretation of information contained herein should be left to medically-trained personnel. Specific patient instructions are provided elsewhere under Patient Instructions section of medical record. This document was created in part using AI and STT-dictation technology, any transcriptional errors that may result from this process are unintentional.  Patient: [name]  Service: E/M   PCP: Lorel Maxie LABOR, MD  DOB: 18-Sep-1960  DOS: 04/08/2024  Provider: Emmy MARLA Blanch, NP  MRN: [id number]  Delivery: Face-to-face  Specialty: Interventional Pain Management  Type: Established Patient  Setting: Ambulatory outpatient facility  Specialty designation: 09  Referring Prov.: Lorel Maxie LABOR, MD  Location: Outpatient office facility       History of present illness (HPI) [name], a 64 y.o. year old female, is here today because of her No primary diagnosis found.. Ms. Bribiesca's primary complain today is No chief complaint on file.  Pertinent problems: Ms. Lites has Lumbar radiculopathy; Chronic bilateral low back pain with bilateral sciatica; Chronic pain syndrome; Chronic, continuous use of opioids; Chronic myofascial pain; DDD (degenerative disc disease), lumbar; Primary osteoarthritis of both hips; Type 2 diabetes mellitus without complications (HCC); Pharmacologic therapy; Disorder of skeletal system; Problems influencing health status; Chronic constipation; Pelvic pain in female; Pain in both hands; Primary osteoarthritis of right knee; Opiate overdose, accidental or unintentional, sequela; Opioid overdose (HCC); Acute respiratory failure with hypoxia (HCC); Chronic left shoulder pain; Chronic painful diabetic neuropathy (HCC); Primary osteoarthritis of right shoulder; Chronic right shoulder pain; Chronic diastolic CHF (congestive heart failure) (HCC); and Diabetes mellitus without complication (HCC) on their pertinent problem list.  Pain Assessment: Severity  of   is reported as a  /10. Location:    / . Onset:  . Quality:  . Timing:  . Modifying factor(s):  SABRA Vitals:  vitals were not taken for this visit.  BMI: Estimated body mass index is 17.57 kg/m as calculated from the following:   Height as of 01/12/24: 4' 11 (1.499 m).   Weight as of 01/12/24: 87 lb (39.5 kg).  Last encounter: 04/05/2024. Last procedure: Visit date not found.  Reason for encounter: {Blank single:19197::medication management,post-procedure evaluation and assessment,both, medication management and post-procedure evaluation and assessment,evaluation of worsening, or previously known (established) problem,patient-requested evaluation,follow-up evaluation,evaluation for possible interventional PM therapy/treatment,evaluation of new problem, *** }.   Discussed the use of AI scribe software for clinical note transcription with the patient, who gave verbal consent to proceed.  History of Present Illness           Pharmacotherapy Assessment   Monitoring: [place] PMP: PDMP not reviewed this encounter.       Pharmacotherapy: No side-effects or adverse reactions reported. Compliance: No problems identified. Effectiveness: Clinically acceptable.  No notes on file  UDS:  Summary  Date Value Ref Range Status  12/15/2023 FINAL  Final    Comment:    ==================================================================== [employer] 13 (MW) ==================================================================== Specimen Alert Note: Urinary creatinine is low; ability to detect some drugs may be compromised. Interpret results with caution. (Creatinine) ==================================================================== Test                             Result       Flag       Units  Drug Present and Declared for Prescription Verification   Hydrocodone                     2858  EXPECTED   ng/mg creat   Norhydrocodone                 2558         EXPECTED    ng/mg creat    Sources of hydrocodone  include scheduled prescription medications.    Norhydrocodone is an expected metabolite of hydrocodone .  ==================================================================== Test                      Result    Flag   Units      Ref Range   Creatinine              12        LL     mg/dL      >=79 ==================================================================== Declared Medications:  The flagging and interpretation on this report are based on the  following declared medications.  Unexpected results may arise from  inaccuracies in the declared medications.   **Note: The testing scope of this panel includes these medications:   Hydrocodone  (Norco)   **Note: The testing scope of this panel does not include the  following reported medications:   Acetaminophen  (Norco)  Albuterol  (Ventolin  HFA)  Aspirin   Cephalexin  (Keflex )  Cetirizine (Zyrtec)  Docusate (Colace)  Famotidine  (Pepcid )  Linaclotide  (Linzess )  Meclizine (Antivert)  Metformin (Glucophage)  Mirtazapine  (Remeron )  Omeprazole (Prilosec)  Ondansetron   Pregabalin   Propranolol  (Inderal )  Rizatriptan (Maxalt)  Rosuvastatin  (Crestor )  Topical Lidocaine  (Lidoderm )  Topiramate  (Topamax )  Trazodone  (Desyrel )  Venlafaxine  (Effexor ) ==================================================================== For clinical consultation, please call [phone]. ====================================================================     No results found for: CBDTHCR No results found for: D8THCCBX No results found for: D9THCCBX  ROS  Constitutional: Denies any fever or chills Gastrointestinal: No reported hemesis, hematochezia, vomiting, or acute GI distress Musculoskeletal: Denies any acute onset joint swelling, redness, loss of ROM, or weakness Neurological: No reported episodes of acute onset apraxia, aphasia, dysarthria, agnosia, amnesia, paralysis, loss of coordination, or loss  of consciousness  Medication Review  Accu-Chek Guide, albuterol , aspirin , cetirizine, dextromethorphan-guaiFENesin , docusate sodium, famotidine , feeding supplement, glucose blood, lidocaine , linaclotide , meclizine, metFORMIN, mirtazapine , naproxen , omeprazole, ondansetron , pregabalin , promethazine , propranolol , rizatriptan, rosuvastatin , topiramate , and traZODone   History Review  Allergy: Ms. Goshorn is allergic to tramadol, benadryl [diphenhydramine hcl], bextra [valdecoxib], invokana [canagliflozin], and sulfamethoxazole-trimethoprim. Drug: Ms. Wiederhold  reports current drug use. Drug: Cocaine. Alcohol:  reports that she does not currently use alcohol after a past usage of about 2.0 standard drinks of alcohol per week. Tobacco:  reports that she has been smoking cigarettes. She has a 36 pack-year smoking history. She has been exposed to tobacco smoke. She has quit using smokeless tobacco. Social: Ms. Saad  reports that she has been smoking cigarettes. She has a 36 pack-year smoking history. She has been exposed to tobacco smoke. She has quit using smokeless tobacco. She reports that she does not currently use alcohol after a past usage of about 2.0 standard drinks of alcohol per week. She reports current drug use. Drug: Cocaine. Medical:  has a past medical history of Abnormal CBC, Asthma, Asthma, BPPV (benign paroxysmal positional vertigo), Cardiac murmur, unspecified (08/28/2016), COPD (chronic obstructive pulmonary disease) (HCC), CPAP (continuous positive airway pressure) dependence, Degenerative disc disease, lumbar, Depression, Diabetes mellitus, Diverticulitis (10/2016), Elevated LFTs, Fatty liver, Fibromyalgia, Fibromyalgia, GERD (gastroesophageal reflux disease), Headache, History of degenerative disc disease, Hyperlipidemia, Hypertension, and Sleep apnea. Surgical: Ms. Corrie  has a past surgical history that includes Abdominal surgery; Abdominal hysterectomy;  Esophagogastroduodenoscopy (egd) with propofol  (  N/A, 02/20/2017); Colonoscopy with propofol  (N/A, 02/20/2017); Upper esophageal endoscopic ultrasound (eus) (N/A, 04/03/2017); Diverticulitis; EUS (N/A, 01/08/2018); Cholecystectomy (N/A, 04/27/2018); Cataract extraction w/PHACO (Left, 09/07/2019); Cataract extraction w/PHACO (Right, 09/28/2019); and RIGHT/LEFT HEART CATH AND CORONARY ANGIOGRAPHY (Bilateral, 12/19/2021). Family: family history includes Diabetes in her mother; Hypertension in her mother.  Laboratory Chemistry Profile   Renal Lab Results  Component Value Date   BUN 18 01/05/2024   CREATININE 0.78 01/05/2024   BCR 14 11/27/2017   GFRAA >60 08/09/2019   GFRNONAA >60 01/05/2024    Hepatic Lab Results  Component Value Date   AST 16 01/05/2024   ALT 31 01/05/2024   ALBUMIN 3.6 01/05/2024   ALKPHOS 97 01/05/2024   LIPASE 28 01/05/2024   AMMONIA 46 (H) 04/05/2021    Electrolytes Lab Results  Component Value Date   NA 138 01/05/2024   K 4.7 01/05/2024   CL 99 01/05/2024   CALCIUM  9.8 01/05/2024   MG 1.4 (L) 04/19/2022    Bone No results found for: VD25OH, VD125OH2TOT, CI6874NY7, CI7874NY7, 25OHVITD1, 25OHVITD2, 25OHVITD3, TESTOFREE, TESTOSTERONE  Inflammation (CRP: Acute Phase) (ESR: Chronic Phase) Lab Results  Component Value Date   CRP <1 11/27/2017   ESRSEDRATE 17 11/27/2017   LATICACIDVEN 2.3 (HH) 12/29/2023         Note: Above Lab results reviewed.  Recent Imaging Review  CT ABDOMEN PELVIS W CONTRAST CLINICAL DATA:  Right lower quadrant abdominal pain with nausea for 4-5 days. Patient reports currently being treated for pneumonia and recent pelvic fracture.  EXAM: CT ABDOMEN AND PELVIS WITH CONTRAST  TECHNIQUE: Multidetector CT imaging of the abdomen and pelvis was performed using the standard protocol following bolus administration of intravenous contrast.  RADIATION DOSE REDUCTION: This exam was performed according to the departmental  dose-optimization program which includes automated exposure control, adjustment of the mA and/or kV according to patient size and/or use of iterative reconstruction technique.  CONTRAST:  OMNIPAQUE  IOHEXOL  300 MG/ML  SOLN  COMPARISON:  CTA of the chest, abdomen and pelvis 12/29/2023. Abdominopelvic CT 04/19/2022.  FINDINGS: Lower chest: No acute findings are demonstrated at the lung bases. There is improved aeration of both lung bases with residual linear opacities consistent with atelectasis or scarring. There is no confluent airspace disease or significant pleural effusion. The heart is mildly enlarged.  Hepatobiliary: The liver is normal in density without suspicious focal abnormality. Status post cholecystectomy. No significant biliary dilatation.  Pancreas: Unremarkable. No pancreatic ductal dilatation or surrounding inflammatory changes.  Spleen: Normal in size without focal abnormality.  Adrenals/Urinary Tract: Both adrenal glands appear normal. No evidence of urinary tract calculus, suspicious renal lesion or hydronephrosis. The bladder appears unremarkable for its degree of distention.  Stomach/Bowel: No enteric contrast administered. The stomach appears unremarkable for its degree of distension. No evidence of bowel wall thickening, distention or surrounding inflammatory change. The appendix appears normal. There is a moderate amount of stool within the ascending and transverse colon.  Vascular/Lymphatic: There are no enlarged abdominal or pelvic lymph nodes. No acute vascular findings. Atherosclerosis of the aorta, great vessels and coronary arteries without evidence of aneurysm or large vessel occlusion.  Reproductive: Status post hysterectomy. No evidence of adnexal mass.  Other: No evidence of abdominal wall mass or hernia. No ascites or pneumoperitoneum.  Musculoskeletal: Nondisplaced acute/subacute fractures of the left sacral ala and inferior  pubic ramus, new from 04/19/2022. No displaced fracture, sacroiliac joint diastasis or widening of the symphysis pubis.  IMPRESSION: 1. No acute findings or explanation for  the patient's symptoms. The appendix appears normal. 2. Nondisplaced acute/subacute fractures of the left sacral ala and inferior pubic ramus, new from 04/19/2022. 3. Improved aeration of both lung bases with residual linear opacities consistent with atelectasis or scarring. 4. Interval improved aeration of the lung bases with residual linear opacities bilaterally. 5.  Aortic Atherosclerosis (ICD10-I70.0).  Electronically Signed   By: Elsie Perone M.D.   On: 01/05/2024 14:03 Note: Reviewed        Physical Exam  Vitals: LMP  (LMP Unknown) Comment: many years BMI: Estimated body mass index is 17.57 kg/m as calculated from the following:   Height as of 01/12/24: 4' 11 (1.499 m).   Weight as of 01/12/24: 87 lb (39.5 kg). Ideal: Patient weight not recorded General appearance: Well nourished, well developed, and well hydrated. In no apparent acute distress Mental status: Alert, oriented x 3 (person, place, & time)       Respiratory: No evidence of acute respiratory distress Eyes: PERLA   Assessment   Diagnosis Status  No diagnosis found. Controlled Controlled Controlled   Updated Problems: No problems updated.  Plan of Care  Problem-specific:  Assessment and Plan    Monitoring: [place] PMP: PDMP not reviewed this encounter. 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, T40.2X5A),No side-effects or adverse reactions reported.} Compliance: {Blank single:19197::Medication agreement violation - unsanctioned acquisition/use of additional opioid-containing medication,No problems identified.} Effectiveness: {Blank  single:19197::Clinically acceptable.}  No notes on file  UDS:  Summary  Date Value Ref Range Status  12/15/2023 FINAL  Final    Comment:    ==================================================================== [employer] 13 (MW) ==================================================================== Specimen Alert Note: Urinary creatinine is low; ability to detect some drugs may be compromised. Interpret results with caution. (Creatinine) ==================================================================== Test                             Result       Flag       Units  Drug Present and Declared for Prescription Verification   Hydrocodone                     2858         EXPECTED   ng/mg creat   Norhydrocodone                 2558         EXPECTED   ng/mg creat    Sources of hydrocodone  include scheduled prescription medications.    Norhydrocodone is an expected metabolite of hydrocodone .  ==================================================================== Test                      Result    Flag   Units      Ref Range   Creatinine              12        LL     mg/dL      >=79 ==================================================================== Declared Medications:  The flagging and interpretation on this report are based on the  following declared medications.  Unexpected results may arise from  inaccuracies in the declared medications.   **Note: The testing scope of this panel includes these medications:   Hydrocodone  (Norco)   **Note: The testing scope of this panel does not include the  following reported medications:   Acetaminophen  (Norco)  Albuterol  (Ventolin  HFA)  Aspirin   Cephalexin  (Keflex )  Cetirizine (Zyrtec)  Docusate (Colace)  Famotidine  (Pepcid )  Linaclotide  (Linzess )  Meclizine (Antivert)  Metformin (Glucophage)  Mirtazapine  (Remeron )  Omeprazole (Prilosec)  Ondansetron   Pregabalin   Propranolol  (Inderal )  Rizatriptan (Maxalt)  Rosuvastatin   (Crestor )  Topical Lidocaine  (Lidoderm )  Topiramate  (Topamax )  Trazodone  (Desyrel )  Venlafaxine  (Effexor ) ==================================================================== For clinical consultation, please call [phone]. ====================================================================     No results found for: CBDTHCR No results found for: D8THCCBX No results found for: D9THCCBX  ROS  Constitutional: {Blank single:19197::Denies any fever or chills} Gastrointestinal: {Blank single:19197::No reported hemesis, hematochezia, vomiting, or acute GI distress} Musculoskeletal: {Blank single:19197::Denies any acute onset joint swelling, redness, loss of ROM, or weakness} Neurological: {Blank single:19197::No reported episodes of acute onset apraxia, aphasia, dysarthria, agnosia, amnesia, paralysis, loss of coordination, or loss of consciousness}  Medication Review  Accu-Chek Guide, HYDROcodone -acetaminophen , albuterol , aspirin , cetirizine, dextromethorphan-guaiFENesin , docusate sodium, famotidine , feeding supplement, glucose blood, lidocaine , linaclotide , meclizine, metFORMIN, mirtazapine , naproxen , omeprazole, ondansetron , pregabalin , promethazine , propranolol , rizatriptan, rosuvastatin , topiramate , and traZODone   History Review  Allergy: Ms. Rounds is allergic to tramadol, benadryl [diphenhydramine hcl], bextra [valdecoxib], invokana [canagliflozin], and sulfamethoxazole-trimethoprim. Drug: Ms. Marik  reports current drug use. Drug: Cocaine. Alcohol:  reports that she does not currently use alcohol after a past usage of about 2.0 standard drinks of alcohol per week. Tobacco:  reports that she has been smoking cigarettes. She has a 36 pack-year smoking history. She has been exposed to tobacco smoke. She has quit using smokeless tobacco. Social: Ms. Chinchilla  reports that she has been smoking cigarettes. She has a 36 pack-year smoking history. She has been  exposed to tobacco smoke. She has quit using smokeless tobacco. She reports that she does not currently use alcohol after a past usage of about 2.0 standard drinks of alcohol per week. She reports current drug use. Drug: Cocaine. Medical:  has a past medical history of Abnormal CBC, Asthma, Asthma, BPPV (benign paroxysmal positional vertigo), Cardiac murmur, unspecified (08/28/2016), COPD (chronic obstructive pulmonary disease) (HCC), CPAP (continuous positive airway pressure) dependence, Degenerative disc disease, lumbar, Depression, Diabetes mellitus, Diverticulitis (10/2016), Elevated LFTs, Fatty liver, Fibromyalgia, Fibromyalgia, GERD (gastroesophageal reflux disease), Headache, History of degenerative disc disease, Hyperlipidemia, Hypertension, and Sleep apnea. Surgical: Ms. Hedstrom  has a past surgical history that includes Abdominal surgery; Abdominal hysterectomy; Esophagogastroduodenoscopy (egd) with propofol  (N/A, 02/20/2017); Colonoscopy with propofol  (N/A, 02/20/2017); Upper esophageal endoscopic ultrasound (eus) (N/A, 04/03/2017); Diverticulitis; EUS (N/A, 01/08/2018); Cholecystectomy (N/A, 04/27/2018); Cataract extraction w/PHACO (Left, 09/07/2019); Cataract extraction w/PHACO (Right, 09/28/2019); and RIGHT/LEFT HEART CATH AND CORONARY ANGIOGRAPHY (Bilateral, 12/19/2021). Family: family history includes Diabetes in her mother; Hypertension in her mother.  Laboratory Chemistry Profile   Renal Lab Results  Component Value Date   BUN 18 01/05/2024   CREATININE 0.78 01/05/2024   BCR 14 11/27/2017   GFRAA >60 08/09/2019   GFRNONAA >60 01/05/2024    Hepatic Lab Results  Component Value Date   AST 16 01/05/2024   ALT 31 01/05/2024   ALBUMIN 3.6 01/05/2024   ALKPHOS 97 01/05/2024   LIPASE 28 01/05/2024   AMMONIA 46 (H) 04/05/2021    Electrolytes Lab Results  Component Value Date   NA 138 01/05/2024   K 4.7 01/05/2024   CL 99 01/05/2024   CALCIUM  9.8 01/05/2024   MG 1.4 (L)  04/19/2022    Bone No results found for: VD25OH, VD125OH2TOT, CI6874NY7, CI7874NY7, 25OHVITD1, 25OHVITD2, 25OHVITD3, TESTOFREE, TESTOSTERONE  Inflammation (CRP: Acute Phase) (ESR: Chronic Phase) Lab Results  Component Value  Date   CRP <1 11/27/2017   ESRSEDRATE 17 11/27/2017   LATICACIDVEN 2.3 (HH) 12/29/2023         Note: {Blank single:19197::Ms. Rotenberg indicates labs done and monitored by primary care practitioner using a non-[facility] EMR system,No results found under the [facility] electronic medical record,Patient noncompliant with Lab Work orders.,Results made available to patient.,Lab results reviewed and made available to patient.,Lab results reviewed and explained to patient in Layman's terms.,Above Lab results reviewed.}  Recent Imaging Review  CT ABDOMEN PELVIS W CONTRAST CLINICAL DATA:  Right lower quadrant abdominal pain with nausea for 4-5 days. Patient reports currently being treated for pneumonia and recent pelvic fracture.  EXAM: CT ABDOMEN AND PELVIS WITH CONTRAST  TECHNIQUE: Multidetector CT imaging of the abdomen and pelvis was performed using the standard protocol following bolus administration of intravenous contrast.  RADIATION DOSE REDUCTION: This exam was performed according to the departmental dose-optimization program which includes automated exposure control, adjustment of the mA and/or kV according to patient size and/or use of iterative reconstruction technique.  CONTRAST:  OMNIPAQUE  IOHEXOL  300 MG/ML  SOLN  COMPARISON:  CTA of the chest, abdomen and pelvis 12/29/2023. Abdominopelvic CT 04/19/2022.  FINDINGS: Lower chest: No acute findings are demonstrated at the lung bases. There is improved aeration of both lung bases with residual linear opacities consistent with atelectasis or scarring. There is no confluent airspace disease or significant pleural effusion. The heart is mildly  enlarged.  Hepatobiliary: The liver is normal in density without suspicious focal abnormality. Status post cholecystectomy. No significant biliary dilatation.  Pancreas: Unremarkable. No pancreatic ductal dilatation or surrounding inflammatory changes.  Spleen: Normal in size without focal abnormality.  Adrenals/Urinary Tract: Both adrenal glands appear normal. No evidence of urinary tract calculus, suspicious renal lesion or hydronephrosis. The bladder appears unremarkable for its degree of distention.  Stomach/Bowel: No enteric contrast administered. The stomach appears unremarkable for its degree of distension. No evidence of bowel wall thickening, distention or surrounding inflammatory change. The appendix appears normal. There is a moderate amount of stool within the ascending and transverse colon.  Vascular/Lymphatic: There are no enlarged abdominal or pelvic lymph nodes. No acute vascular findings. Atherosclerosis of the aorta, great vessels and coronary arteries without evidence of aneurysm or large vessel occlusion.  Reproductive: Status post hysterectomy. No evidence of adnexal mass.  Other: No evidence of abdominal wall mass or hernia. No ascites or pneumoperitoneum.  Musculoskeletal: Nondisplaced acute/subacute fractures of the left sacral ala and inferior pubic ramus, new from 04/19/2022. No displaced fracture, sacroiliac joint diastasis or widening of the symphysis pubis.  IMPRESSION: 1. No acute findings or explanation for the patient's symptoms. The appendix appears normal. 2. Nondisplaced acute/subacute fractures of the left sacral ala and inferior pubic ramus, new from 04/19/2022. 3. Improved aeration of both lung bases with residual linear opacities consistent with atelectasis or scarring. 4. Interval improved aeration of the lung bases with residual linear opacities bilaterally. 5.  Aortic Atherosclerosis (ICD10-I70.0).  Electronically Signed   By:  Elsie Perone M.D.   On: 01/05/2024 14:03 Note: {Blank single:19197::No new results found.,No results found under the [facility] electronic medical record.,Imaging results reviewed and explained to patient in Layman's terms.,Results of ordered imaging test(s) reviewed and explained to patient in Layman's terms.,Imaging results reviewed.,Reviewed} {Blank single:19197::Results visible under [facility].,Results visible under [facility].,Results visible under [facility].,Results visible under [facility].,Results visible under Care Everywhere.,Results made available to patient.,Copy of results provided to patient.,Patient noncompliant with diagnostic imaging orders.,     }  Physical Exam  Vitals: LMP  (LMP Unknown) Comment: many years BMI: Estimated body mass index is 17.57 kg/m as calculated from the following:   Height as of 01/12/24: 4' 11 (1.499 m).   Weight as of 01/12/24: 87 lb (39.5 kg). Ideal: Patient weight not recorded General appearance: {general exam:[id number]::Well nourished, well developed, and well hydrated. In no apparent acute distress} Mental status: {Blank single:19197::Alert and oriented x 3. Exaggerated physical and/or psychosocial pain behavior perceived.,Alert, oriented x 3 (person, place, & time)} {Blank single:19197::Ms. 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 {Blank single:19197::Absent,Deteriorating,Having a Flare-up,Improved,Improving,Not improving,Not  responding,Persistent,Present,Recurring,Reoccurring,Resolved,Responding,Stable,Unchanged,improved,Worsened,Worsening,Controlled} {Blank single:19197::Absent,Deteriorating,Having a Flare-up,Improved,Improving,Not improving,Not responding,Persistent,Present,Recurring,Reoccurring,Resolved,Responding,Stable,Unchanged,improved,Worsened,Worsening,Controlled} {Blank single:19197::Absent,Deteriorating,Having a Flare-up,Improved,Improving,Not improving,Not responding,Persistent,Present,Recurring,Reoccurring,Resolved,Responding,Stable,Unchanged,improved,Worsened,Worsening,Controlled}   Updated Problems: No problems updated.  Plan of Care  Problem-specific:  Assessment and Plan            Ms. Kella Splinter [place] has a current medication list which includes the following long-term medication(s): albuterol , famotidine , metformin, mirtazapine , omeprazole, pregabalin , propranolol , rizatriptan, topiramate , and trazodone .  Pharmacotherapy (Medications Ordered): No orders of the defined types were placed in this encounter.  Orders:  No orders of the defined types were placed in this encounter.    {There is no content from the last Plan section.}   No follow-ups on file.    Recent Visits Date Type Provider Dept  01/12/24 Office Visit Kindred Reidinger K, NP [facility]  01/08/24 Office Visit Jilliam Bellmore K, NP [facility]  Showing recent visits within past 90 days and meeting all other requirements Today's Visits Date Type Provider Dept  04/05/24 Office Visit Acxel Dingee K, NP [facility]  Showing today's visits and meeting all other requirements Future Appointments Date Type Provider Dept  04/08/24 Appointment Cleo Santucci K, NP [facility]  Showing future appointments within next 90 days and meeting all other requirements  I discussed the assessment and  treatment plan with the patient. The patient was provided an opportunity to ask questions and all were answered. The patient agreed with the plan and demonstrated an understanding of the instructions.  Patient advised to call back or seek an in-person evaluation if the symptoms or condition worsens.  Duration of encounter: *** minutes.  Total time on encounter, as per AMA guidelines included both the face-to-face and non-face-to-face time personally spent by the physician and/or other qualified health care professional(s) on the day of the encounter (includes time in activities that require the physician or other qualified health care professional and does not include time in activities normally performed by clinical staff). Physician's time may include the following activities when performed: Preparing to see the patient (e.g., pre-charting review of records, searching for previously ordered imaging, lab work, and nerve conduction tests) Review of prior analgesic pharmacotherapies. Reviewing PMP Interpreting ordered tests (e.g., lab work, imaging, nerve conduction tests) Performing post-procedure evaluations, including interpretation of diagnostic procedures Obtaining and/or reviewing separately obtained history Performing a medically appropriate examination and/or evaluation Counseling and educating the patient/family/caregiver Ordering medications, tests, or procedures Referring and communicating with other health care professionals (when not separately reported) Documenting clinical information in the electronic or other health record Independently interpreting results (not separately reported) and communicating results to the patient/ family/caregiver Care coordination (not separately reported)  Note by: Tima Curet K Sakib Noguez, NP (TTS and  AI technology used. I apologize for any typographical errors that were not detected and corrected.) Date: 04/08/2024; Time: 3:39 PM

## 2024-04-08 ENCOUNTER — Ambulatory Visit: Admitting: Nurse Practitioner

## 2024-04-08 DIAGNOSIS — Z91199 Patient's noncompliance with other medical treatment and regimen due to unspecified reason: Secondary | ICD-10-CM

## 2024-04-08 DIAGNOSIS — G894 Chronic pain syndrome: Secondary | ICD-10-CM

## 2024-04-08 DIAGNOSIS — M5416 Radiculopathy, lumbar region: Secondary | ICD-10-CM

## 2024-04-08 DIAGNOSIS — F1190 Opioid use, unspecified, uncomplicated: Secondary | ICD-10-CM

## 2024-04-13 NOTE — Progress Notes (Unsigned)
 PROVIDER NOTE: Interpretation of information contained herein should be left to medically-trained personnel. Specific patient instructions are provided elsewhere under Patient Instructions section of medical record. This document was created in part using AI and STT-dictation technology, any transcriptional errors that may result from this process are unintentional.  Patient: [name]  Service: E/M   PCP: Lorel Maxie LABOR, MD  DOB: 02/06/1961  DOS: 04/14/2024  Provider: Emmy MARLA Blanch, NP  MRN: [id number]  Delivery: Face-to-face  Specialty: Interventional Pain Management  Type: Established Patient  Setting: Ambulatory outpatient facility  Specialty designation: 09  Referring Prov.: Lorel Maxie LABOR, MD  Location: Outpatient office facility       History of present illness (HPI) [name], a 64 y.o. year old female, is here today because of her Primary osteoarthritis of right knee [M17.11]. [name]'s primary complain today is Back Pain and bilateral knee pain.  Pertinent problems: [name] has Lumbar radiculopathy; Chronic bilateral low back pain with bilateral sciatica; Chronic pain syndrome; Chronic, continuous use of opioids; Chronic myofascial pain; DDD (degenerative disc disease), lumbar; Primary osteoarthritis of both hips; Type 2 diabetes mellitus without complications (HCC); Pharmacologic therapy; Disorder of skeletal system; Problems influencing health status; Chronic constipation; Pelvic pain in female; Pain in both hands; Primary osteoarthritis of right knee; Opiate overdose, accidental or unintentional, sequela; Opioid overdose (HCC); Acute respiratory failure with hypoxia (HCC); Chronic left shoulder pain; Chronic painful diabetic neuropathy (HCC); Primary osteoarthritis of right shoulder; Chronic right shoulder pain; Chronic diastolic CHF (congestive heart failure) (HCC); and Diabetes mellitus without complication (HCC) on their pertinent problem  list.  Pain Assessment: Severity of Chronic pain is reported as a 9 /10. Location: Back Lower/Down right leg. Onset: More than a 12 months ago. Quality: Sharp. Timing: Constant. Modifying factor(s): Pain medication, Muscle relaxer. Vitals:  height is 4' 11 (1.499 m) and weight is 104 lb (47.2 kg). Her temporal temperature is 97.3 F (36.3 C) (abnormal). Her blood pressure is 144/95 (abnormal) and her pulse is 107 (abnormal). Her respiration is 18 and oxygen saturation is 100%.  BMI: Estimated body mass index is 21.01 kg/m as calculated from the following:   Height as of this encounter: 4' 11 (1.499 m).   Weight as of this encounter: 104 lb (47.2 kg).  Last encounter: 01/12/2024. Last procedure: 10/22/2023  Reason for encounter: evaluation for possible interventional PM therapy/treatment and medication management. No change in medical history since last visit.  Patient's pain is at baseline.  Patient continues multimodal pain regimen as prescribed.  States that it provides pain relief and improvement in functional status.   Of note, she received lumbar epidural injection on October 22, 2023 with ongoing 80% pain relief and functional improvement for approximately 6 months.  Given flareup of low back pain with radicular symptoms, the patient expressed interest in repeating lumbar epidural and bilateral knee injection.   Discussed the use of AI scribe software for clinical note transcription with the patient, who gave verbal consent to proceed.  History of Present Illness   [name] is a 64 year old female with lumbar radiculopathy who presents with worsening back pain.  She experiences significant bilateral back pain due to a swollen disc in the lumbar region, radiating down both legs to her toes, often accompanied by numbness in the toes. The pain is severe, rated 9/10, especially since she ran out of her medication, hydrocodone . When medicated, the pain reduces to 6 /10. She has  previously received lumbar  epidural injections since July 2025.  She experiences weakness in her legs, particularly when standing for extended periods, which sometimes makes her feel like her legs are giving out. This weakness impacts her daily activities, such as cleaning her house, as she often needs to lie down due to fatigue and pain.  She has a history of high blood pressure and has not been taking her prescribed propranolol  due to confusion about her medications. She also has diabetes, which she manages with unspecified medications, and reports frequent urination.  She smokes cigarettes and has a history of alcohol use, which she has since quit. She fractured her pelvis a few months ago, and her daughter has been assisting with her medication management. She also has a home health care nurse to help her get back on routine.     Pharmacotherapy Assessment   Hydrocodone -acetaminophen  (Norco) 7.5-325 mg tablet 3 times daily as needed for pain. MME=22.50  Monitoring: [place] PMP: PDMP reviewed during this encounter.       Pharmacotherapy: No side-effects or adverse reactions reported. Compliance: No problems identified. Effectiveness: Clinically acceptable.  Erlene Doyal SAUN[place], [place]  04/14/2024  9:11 AM  Sign when Signing Visit Nursing Pain Medication Assessment:  Safety precautions to be maintained throughout the outpatient stay will include: orient to surroundings, keep bed in low position, maintain call bell within reach at all times, provide assistance with transfer out of bed and ambulation.  Medication Inspection Compliance: Ms. Kirschbaum did not comply with our request to bring her pills to be counted. She was reminded that bringing the medication bottles, even when empty, is a requirement.  Medication: None brought in. Pill/Patch Count: None available to be counted. Bottle Appearance: No container available. Did not bring bottle(s) to appointment. Filled Date: N/A Last Medication intake:   Ran out of medicine more than 48 hours ago    UDS:  Summary  Date Value Ref Range Status  12/15/2023 FINAL  Final    Comment:    ==================================================================== [employer] Select 13 (MW) ==================================================================== Specimen Alert Note: Urinary creatinine is low; ability to detect some drugs may be compromised. Interpret results with caution. (Creatinine) ==================================================================== Test                             Result       Flag       Units  Drug Present and Declared for Prescription Verification   Hydrocodone                     2858         EXPECTED   ng/mg creat   Norhydrocodone                 2558         EXPECTED   ng/mg creat    Sources of hydrocodone  include scheduled prescription medications.    Norhydrocodone is an expected metabolite of hydrocodone .  ==================================================================== Test                      Result    Flag   Units      Ref Range   Creatinine              12        LL     mg/dL      >=79 ==================================================================== Declared Medications:  The flagging and interpretation on this report are based on the  following declared medications.  Unexpected results may arise from  inaccuracies in the declared medications.   **Note: The testing scope of this panel includes these medications:   Hydrocodone  (Norco)   **Note: The testing scope of this panel does not include the  following reported medications:   Acetaminophen  (Norco)  Albuterol  (Ventolin  HFA)  Aspirin   Cephalexin  (Keflex )  Cetirizine (Zyrtec)  Docusate (Colace)  Famotidine  (Pepcid )  Linaclotide  (Linzess )  Meclizine (Antivert)  Metformin (Glucophage)  Mirtazapine  (Remeron )  Omeprazole (Prilosec)  Ondansetron   Pregabalin   Propranolol  (Inderal )  Rizatriptan (Maxalt)  Rosuvastatin  (Crestor )   Topical Lidocaine  (Lidoderm )  Topiramate  (Topamax )  Trazodone  (Desyrel )  Venlafaxine  (Effexor ) ==================================================================== For clinical consultation, please call [phone]. ====================================================================     No results found for: CBDTHCR No results found for: D8THCCBX No results found for: D9THCCBX  ROS  Constitutional: Denies any fever or chills Gastrointestinal: No reported hemesis, hematochezia, vomiting, or acute GI distress Musculoskeletal: B/L Low back pain radiating down to legs and toes associated with numbness, bilateral knee pain Neurological: No reported episodes of acute onset apraxia, aphasia, dysarthria, agnosia, amnesia, paralysis, loss of coordination, or loss of consciousness  Medication Review  Accu-Chek Guide, HYDROcodone -acetaminophen , albuterol , aspirin , docusate sodium, famotidine , feeding supplement, glucose blood, linaclotide , meclizine, metFORMIN, mirtazapine , naproxen , omeprazole, ondansetron , pregabalin , promethazine , propranolol , rizatriptan, topiramate , and traZODone   History Review  Allergy: Ms. Belmar is allergic to tramadol, benadryl [diphenhydramine hcl], bextra [valdecoxib], invokana [canagliflozin], and sulfamethoxazole-trimethoprim. Drug: Ms. Corriher  reports current drug use. Drug: Cocaine. Alcohol:  reports that she does not currently use alcohol after a past usage of about 2.0 standard drinks of alcohol per week. Tobacco:  reports that she has been smoking cigarettes. She has a 36 pack-year smoking history. She has been exposed to tobacco smoke. She has quit using smokeless tobacco. Social: Ms. Gleed  reports that she has been smoking cigarettes. She has a 36 pack-year smoking history. She has been exposed to tobacco smoke. She has quit using smokeless tobacco. She reports that she does not currently use alcohol after a past usage of about 2.0  standard drinks of alcohol per week. She reports current drug use. Drug: Cocaine. Medical:  has a past medical history of Abnormal CBC, Asthma, Asthma, BPPV (benign paroxysmal positional vertigo), Cardiac murmur, unspecified (08/28/2016), COPD (chronic obstructive pulmonary disease) (HCC), CPAP (continuous positive airway pressure) dependence, Degenerative disc disease, lumbar, Depression, Diabetes mellitus, Diverticulitis (10/2016), Elevated LFTs, Fatty liver, Fibromyalgia, Fibromyalgia, GERD (gastroesophageal reflux disease), Headache, History of degenerative disc disease, Hyperlipidemia, Hypertension, and Sleep apnea. Surgical: Ms. Wisnewski  has a past surgical history that includes Abdominal surgery; Abdominal hysterectomy; Esophagogastroduodenoscopy (egd) with propofol  (N/A, 02/20/2017); Colonoscopy with propofol  (N/A, 02/20/2017); Upper esophageal endoscopic ultrasound (eus) (N/A, 04/03/2017); Diverticulitis; EUS (N/A, 01/08/2018); Cholecystectomy (N/A, 04/27/2018); Cataract extraction w/PHACO (Left, 09/07/2019); Cataract extraction w/PHACO (Right, 09/28/2019); and RIGHT/LEFT HEART CATH AND CORONARY ANGIOGRAPHY (Bilateral, 12/19/2021). Family: family history includes Diabetes in her mother; Hypertension in her mother.  Laboratory Chemistry Profile   Renal Lab Results  Component Value Date   BUN 18 01/05/2024   CREATININE 0.78 01/05/2024   BCR 14 11/27/2017   GFRAA >60 08/09/2019   GFRNONAA >60 01/05/2024    Hepatic Lab Results  Component Value Date   AST 16 01/05/2024   ALT 31 01/05/2024   ALBUMIN 3.6 01/05/2024   ALKPHOS 97 01/05/2024   LIPASE 28 01/05/2024   AMMONIA 46 (H) 04/05/2021    Electrolytes Lab Results  Component Value Date   NA 138 01/05/2024   K  4.7 01/05/2024   CL 99 01/05/2024   CALCIUM  9.8 01/05/2024   MG 1.4 (L) 04/19/2022    Bone No results found for: VD25OH, [id number], CI6874NY7, CI7874NY7, 25OHVITD1, 25OHVITD2, 25OHVITD3, TESTOFREE,  TESTOSTERONE  Inflammation (CRP: Acute Phase) (ESR: Chronic Phase) Lab Results  Component Value Date   CRP <1 11/27/2017   ESRSEDRATE 17 11/27/2017   LATICACIDVEN 2.3 (HH) 12/29/2023         Note: Above Lab results reviewed.  Recent Imaging Review  CT ABDOMEN PELVIS W CONTRAST CLINICAL DATA:  Right lower quadrant abdominal pain with nausea for 4-5 days. Patient reports currently being treated for pneumonia and recent pelvic fracture.  EXAM: CT ABDOMEN AND PELVIS WITH CONTRAST  TECHNIQUE: Multidetector CT imaging of the abdomen and pelvis was performed using the standard protocol following bolus administration of intravenous contrast.  RADIATION DOSE REDUCTION: This exam was performed according to the departmental dose-optimization program which includes automated exposure control, adjustment of the mA and/or kV according to patient size and/or use of iterative reconstruction technique.  CONTRAST:  OMNIPAQUE  IOHEXOL  300 MG/ML  SOLN  COMPARISON:  CTA of the chest, abdomen and pelvis 12/29/2023. Abdominopelvic CT 04/19/2022.  FINDINGS: Lower chest: No acute findings are demonstrated at the lung bases. There is improved aeration of both lung bases with residual linear opacities consistent with atelectasis or scarring. There is no confluent airspace disease or significant pleural effusion. The heart is mildly enlarged.  Hepatobiliary: The liver is normal in density without suspicious focal abnormality. Status post cholecystectomy. No significant biliary dilatation.  Pancreas: Unremarkable. No pancreatic ductal dilatation or surrounding inflammatory changes.  Spleen: Normal in size without focal abnormality.  Adrenals/Urinary Tract: Both adrenal glands appear normal. No evidence of urinary tract calculus, suspicious renal lesion or hydronephrosis. The bladder appears unremarkable for its degree of distention.  Stomach/Bowel: No enteric contrast administered.  The stomach appears unremarkable for its degree of distension. No evidence of bowel wall thickening, distention or surrounding inflammatory change. The appendix appears normal. There is a moderate amount of stool within the ascending and transverse colon.  Vascular/Lymphatic: There are no enlarged abdominal or pelvic lymph nodes. No acute vascular findings. Atherosclerosis of the aorta, great vessels and coronary arteries without evidence of aneurysm or large vessel occlusion.  Reproductive: Status post hysterectomy. No evidence of adnexal mass.  Other: No evidence of abdominal wall mass or hernia. No ascites or pneumoperitoneum.  Musculoskeletal: Nondisplaced acute/subacute fractures of the left sacral ala and inferior pubic ramus, new from 04/19/2022. No displaced fracture, sacroiliac joint diastasis or widening of the symphysis pubis.  IMPRESSION: 1. No acute findings or explanation for the patient's symptoms. The appendix appears normal. 2. Nondisplaced acute/subacute fractures of the left sacral ala and inferior pubic ramus, new from 04/19/2022. 3. Improved aeration of both lung bases with residual linear opacities consistent with atelectasis or scarring. 4. Interval improved aeration of the lung bases with residual linear opacities bilaterally. 5.  Aortic Atherosclerosis (ICD10-I70.0).  Electronically Signed   By: Elsie Perone M.D.   On: 01/05/2024 14:03 Note: Reviewed        Physical Exam  Vitals: BP (!) 156/111 (BP Location: Right Arm, Patient Position: Sitting, Cuff Size: Small)   Pulse (!) 108   Temp (!) 97.3 F (36.3 C) (Temporal)   Resp 18   Ht 4' 11 (1.499 m)   Wt 104 lb (47.2 kg)   LMP  (LMP Unknown) Comment: many years  SpO2 100%   BMI 21.01 kg/m  BMI:  Estimated body mass index is 21.01 kg/m as calculated from the following:   Height as of this encounter: 4' 11 (1.499 m).   Weight as of this encounter: 104 lb (47.2 kg). Ideal: Patient must be  at least 60 in tall to calculate ideal body weight General appearance: Well nourished, well developed, and well hydrated. In no apparent acute distress Mental status: Alert, oriented x 3 (person, place, & time)       Respiratory: No evidence of acute respiratory distress Eyes: PERLA  Musculoskeletal: +LBP Bilateral knee pain worse with weightbearing, walking, and prolonged standing Lumbar Exam  Skin & Axial Inspection: No masses, redness, or swelling Alignment: Symmetrical Functional ROM: Pain restricted ROM affecting both sides Stability: No instability detected Muscle Tone/Strength: Functionally intact. No obvious neuro-muscular anomalies detected. Sensory (Neurological): Dermatomal pain pattern Palpation: No palpable anomalies       Provocative Tests: Hyperextension/rotation test: deferred today       Lumbar quadrant test (Kemp's test): (+) bilateral for foraminal stenosis Lateral bending test: (+) ipsilateral radicular pain, bilaterally. Positive for bilateral foraminal stenosis. Patrick's Maneuver: (+) due to pain   Gait & Posture Assessment  Ambulation: Unassisted Gait: Relatively normal for age and body habitus Posture: WNL  Lower Extremity Exam      Side: Right lower extremity   Side: Left lower extremity  Stability: No instability observed           Stability: No instability observed          Skin & Extremity Inspection: Skin color, temperature, and hair growth are WNL. No peripheral edema or cyanosis. No masses, redness, swelling, asymmetry, or associated skin lesions. No contractures.   Skin & Extremity Inspection: Skin color, temperature, and hair growth are WNL. No peripheral edema or cyanosis. No masses, redness, swelling, asymmetry, or associated skin lesions. No contractures.  Functional ROM: Unrestricted ROM                   Functional ROM: Unrestricted ROM                  Muscle Tone/Strength: Functionally intact. No obvious neuro-muscular anomalies detected.    Muscle Tone/Strength: Functionally intact. No obvious neuro-muscular anomalies detected.  Sensory (Neurological): Unimpaired         Sensory (Neurological): Unimpaired        DTR: Patellar: deferred today Achilles: deferred today Plantar: deferred today   DTR: Patellar: deferred today Achilles: deferred today Plantar: deferred today  Palpation: No palpable anomalies   Palpation: No palpable anomalies     Assessment   Diagnosis Status  1. Primary osteoarthritis of right knee   2. Lumbar radiculopathy   3. Medication management   4. Chronic painful diabetic neuropathy (HCC)   5. Arthritis of left knee   6. Chronic, continuous use of opioids   7. Chronic pain syndrome    Flared-up Flared-up Controlled   Updated Problems: Problem  Medication Management    Plan of Care  Problem-specific:  Assessment and Plan    Lumbar radiculopathy Chronic lumbar radiculopathy with severe bilateral leg pain, numbness, and weakness. Previous lumbar epidural provided 80% relief. - Schedule lumbar epidural injection in March. - Administer Valium  for sedation during procedure. - Continue hydrocodone  7.5 mg/325 mg for pain management.  Bilateral knee osteoarthritis Crepitation present. Previous Monovisc gel injection provided some relief. - Schedule bilateral knee Monovisc gel injections in March.  Chronic pain syndrome Significantly impacts daily activities. Pain management includes hydrocodone  and Miracle. - Continue  current pain management regimen with hydrocodone  and Miracle.  Chronic opioid therapy Managed with hydrocodone . - Continue hydrocodone  7.5 mg/325 mg as prescribed.  Medication management Non-adherence to propranolol  for hypertension. Blood pressure elevated at 144/95. - Contact primary care physician to address hypertension management. - Resume propranolol  40 mg twice daily as prescribed to manage BP. Patient's pain is controlled with Norco, will continue on current  medication regimen.  Prescribing drug monitoring (PDMP) reviewed, findings consistent with the use of prescribed medication no evidence of narcotic misuse or abuse.  Urine drug screening (UDS) up to date and consistent with prescribed medication.  No side effects or adverse reaction reported to medication.  Schedule follow-up in 90 days for medication management.  Plan: (Clinic): (B/L) Knee injection (Monovisc) and (B/L) L-ESI # 2 (PO valium ) with Dr. Marcelino      Ms. Arelie Kuzel Spieler has a current medication list which includes the following long-term medication(s): albuterol , famotidine , metformin, mirtazapine , omeprazole, pregabalin , propranolol , rizatriptan, topiramate , and trazodone .  Pharmacotherapy (Medications Ordered): Meds ordered this encounter  Medications   HYDROcodone -acetaminophen  (NORCO) 7.5-325 MG tablet    Sig: Take 1 tablet by mouth every 8 (eight) hours as needed for moderate pain (pain score 4-6).    Dispense:  90 tablet    Refill:  0   HYDROcodone -acetaminophen  (NORCO) 7.5-325 MG tablet    Sig: Take 1 tablet by mouth every 8 (eight) hours as needed for moderate pain (pain score 4-6).    Dispense:  90 tablet    Refill:  0   HYDROcodone -acetaminophen  (NORCO) 7.5-325 MG tablet    Sig: Take 1 tablet by mouth every 8 (eight) hours as needed for moderate pain (pain score 4-6).    Dispense:  90 tablet    Refill:  0   Orders:  Orders Placed This Encounter  Procedures   KNEE INJECTION    Indications: Knee arthralgia (pain) due to osteoarthritis (OA) Imaging: None ([id number]) Nursing: Please request pharmacy to have Monovisc (Hyaluronan) available in office.    Standing Status:   Future    Expected Date:   04/28/2024    Expiration Date:   07/13/2024    Scheduling Instructions:     Procedure: Knee injection Monovisc (hyaluronan 22mg /mL) (88mg /46mL)     Treatment No.: 5     Level: Intra-articular     Laterality: Bilateral Knee     Sedation: Patient's choice.      Timeframe: in two (2) weeks     Total Hyaluronic acid (AKA hyaluronan or hyaluronate) per injection: 88 mg (22.0 mg/mL x 4 mL). Series of 1 injection.    Where will this procedure be performed?:   [facility]   Lumbar Epidural Injection    Standing Status:   Future    Expiration Date:   07/13/2024    Scheduling Instructions:     Procedure: Interlaminar Lumbar Epidural Steroid injection (LESI)  L4-5     Laterality: Midline     Sedation: With Sedation (PO Valium )     Timeframe: As soon as schedule allows.    Where will this procedure be performed?:   [facility]        Return in about 2 months (around 06/12/2024) for [facility]): (B/L) Knee injection (Monovisc) and (B/L) L-ESI # 2 (PO valium ) with Dr. Marcelino .    Recent Visits No visits were found meeting these conditions. Showing recent visits within past 90 days and meeting all other requirements Today's Visits Date Type Provider Dept  04/14/24 Office  Visit Edna Rede K, NP [facility]  Showing today's visits and meeting all other requirements Future Appointments Date Type Provider Dept  06/07/24 Appointment Reynol Arnone K, NP [facility]  Showing future appointments within next 90 days and meeting all other requirements  I discussed the assessment and treatment plan with the patient. The patient was provided an opportunity to ask questions and all were answered. The patient agreed with the plan and demonstrated an understanding of the instructions.  Patient advised to call back or seek an in-person evaluation if the symptoms or condition worsens.  I personally spent a total of 30 minutes in the care of the patient today including preparing to see the patient, getting/reviewing separately obtained history, performing a medically appropriate exam/evaluation, counseling and educating, placing orders, referring and communicating with other health care professionals, documenting clinical information in  the EHR, independently interpreting results, communicating results, and coordinating care.  Note by: Marcie Shearon K Bailyn Spackman, NP (TTS and AI technology used. I apologize for any typographical errors that were not detected and corrected.) Date: 04/14/2024; Time: 3:15 PM

## 2024-04-14 ENCOUNTER — Ambulatory Visit: Attending: Nurse Practitioner | Admitting: Nurse Practitioner

## 2024-04-14 ENCOUNTER — Telehealth: Payer: Self-pay | Admitting: Student in an Organized Health Care Education/Training Program

## 2024-04-14 ENCOUNTER — Telehealth: Payer: Self-pay

## 2024-04-14 ENCOUNTER — Encounter: Payer: Self-pay | Admitting: Nurse Practitioner

## 2024-04-14 VITALS — BP 156/111 | HR 108 | Temp 97.3°F | Resp 18 | Ht 59.0 in | Wt 104.0 lb

## 2024-04-14 DIAGNOSIS — F1190 Opioid use, unspecified, uncomplicated: Secondary | ICD-10-CM | POA: Insufficient documentation

## 2024-04-14 DIAGNOSIS — E1140 Type 2 diabetes mellitus with diabetic neuropathy, unspecified: Secondary | ICD-10-CM | POA: Diagnosis present

## 2024-04-14 DIAGNOSIS — M5416 Radiculopathy, lumbar region: Secondary | ICD-10-CM | POA: Insufficient documentation

## 2024-04-14 DIAGNOSIS — Z79891 Long term (current) use of opiate analgesic: Secondary | ICD-10-CM

## 2024-04-14 DIAGNOSIS — Z79899 Other long term (current) drug therapy: Secondary | ICD-10-CM | POA: Insufficient documentation

## 2024-04-14 DIAGNOSIS — G894 Chronic pain syndrome: Secondary | ICD-10-CM | POA: Diagnosis present

## 2024-04-14 DIAGNOSIS — M1712 Unilateral primary osteoarthritis, left knee: Secondary | ICD-10-CM | POA: Diagnosis present

## 2024-04-14 DIAGNOSIS — M1711 Unilateral primary osteoarthritis, right knee: Secondary | ICD-10-CM | POA: Diagnosis present

## 2024-04-14 DIAGNOSIS — Z7984 Long term (current) use of oral hypoglycemic drugs: Secondary | ICD-10-CM | POA: Diagnosis not present

## 2024-04-14 MED ORDER — HYDROCODONE-ACETAMINOPHEN 7.5-325 MG PO TABS: 1.0000 | ORAL_TABLET | Freq: Three times a day (TID) | ORAL | 0 refills | Status: AC | PRN

## 2024-04-14 NOTE — Telephone Encounter (Signed)
 Prior auth for meds

## 2024-04-14 NOTE — Patient Instructions (Addendum)
 " ______________________________________________________________________    Post-Procedure Discharge Instructions  INSTRUCTIONS Apply ice:  Purpose: This will minimize any swelling and discomfort after procedure.  When: Day of procedure, as soon as you get home. How: Fill a plastic sandwich bag with crushed ice. Cover it with a small towel and apply to injection site. How long: (15 min on, 15 min off) Apply for 15 minutes then remove x 15 minutes.  Repeat sequence on day of procedure, until you go to bed. Apply heat:  Purpose: To treat any soreness and discomfort from the procedure. When: Starting the next day after the procedure. How: Apply heat to procedure site starting the day following the procedure. How long: May continue to repeat daily, until discomfort goes away. Food intake: Start with clear liquids (like water) and advance to regular food, as tolerated.  Physical activities: Keep activities to a minimum for the first 8 hours after the procedure. After that, then as tolerated. Driving: If you have received any sedation, be responsible and do not drive. You are not allowed to drive for 24 hours after having sedation. Blood thinner: (Applies only to those taking blood thinners) You may restart your blood thinner 6 hours after your procedure. Insulin : (Applies only to Diabetic patients taking insulin ) As soon as you can eat, you may resume your normal dosing schedule. Infection prevention: Keep procedure site clean and dry. Shower daily and clean area with soap and water.  PAIN DIARY Post-procedure Pain Diary: Extremely important that this be done correctly and accurately. Recorded information will be used to determine the next step in treatment. For the purpose of accuracy, follow these rules: Evaluate only the area treated. Do not report or include pain from an untreated area. For the purpose of this evaluation, ignore all other areas of pain, except for the treated area. After your  procedure, avoid taking a long nap and attempting to complete the pain diary after you wake up. Instead, set your alarm clock to go off every hour, on the hour, for the initial 8 hours after the procedure. Document the duration of the numbing medicine, and the relief you are getting from it. Do not go to sleep and attempt to complete it later. It will not be accurate. If you received sedation, it is likely that you were given a medication that may cause amnesia. Because of this, completing the diary at a later time may cause the information to be inaccurate. This information is needed to plan your care. Follow-up appointment: Keep your post-procedure follow-up evaluation appointment after the procedure (usually 2 weeks for most procedures, 6 weeks for radiofrequencies). DO NOT FORGET to bring you pain diary with you.   EXPECT... (What should I expect to see with my procedure?) From numbing medicine (AKA: Local Anesthetics): Numbness or decrease in pain. You may also experience some weakness, which if present, could last for the duration of the local anesthetic. Onset: Full effect within 15 minutes of injected. Duration: It will depend on the type of local anesthetic used. On the average, 1 to 8 hours.  From steroids (Applies only if steroids were used): Decrease in swelling or inflammation. Once inflammation is improved, relief of the pain will follow. Onset of benefits: Depends on the amount of swelling present. The more swelling, the longer it will take for the benefits to be seen. In some cases, up to 10 days. Duration: Steroids will stay in the system x 2 weeks. Duration of benefits will depend on multiple posibilities including persistent  irritating factors. Side-effects: If present, they may typically last 2 weeks (the duration of the steroids). Frequent: Cramps (if they occur, drink Gatorade and take over-the-counter Magnesium  450-500 mg once to twice a day); water retention with temporary weight  gain; increases in blood sugar; decreased immune system response; increased appetite. Occasional: Facial flushing (red, warm cheeks); mood swings; menstrual changes. Uncommon: Long-term decrease or suppression of natural hormones; bone thinning. (These are more common with higher doses or more frequent use. This is why we prefer that our patients avoid having any injection therapies in other practices.)  Very Rare: Severe mood changes; psychosis; aseptic necrosis. From procedure: Some discomfort is to be expected once the numbing medicine wears off. This should be minimal if ice and heat are applied as instructed.  CALL IF... (When should I call?) You experience numbness and weakness that gets worse with time, as opposed to wearing off. New onset bowel or bladder incontinence. (Applies only to procedures done in the spine)  Emergency Numbers: Durning business hours (Monday - Thursday, 8:00 AM - 4:00 PM) (Friday, 9:00 AM - 12:00 Noon): [phone] After hours: [phone] NOTE: If you are having a problem and are unable connect with, or to talk to a provider, then go to your nearest urgent care or emergency department. If the problem is serious and urgent, please call 911. ______________________________________________________________________     ______________________________________________________________________    Opioid Pain Medication Update  To: All patients taking opioid pain medications. (I.e.: hydrocodone , hydromorphone, oxycodone , oxymorphone, morphine , codeine, methadone, tapentadol, tramadol, buprenorphine, fentanyl , etc.)  Re: Updated review of side effects and adverse reactions of opioid analgesics, as well as new information about long term effects of this class of medications.  Direct risks of long-term opioid therapy are not limited to opioid addiction and overdose. Potential medical risks include serious fractures, breathing problems during sleep, hyperalgesia,  immunosuppression, chronic constipation, bowel obstruction, myocardial infarction, and tooth decay secondary to xerostomia.  Unpredictable adverse effects that can occur even if you take your medication correctly: Cognitive impairment, respiratory depression, and death. Most people think that if they take their medication correctly, and as instructed, that they will be safe. Nothing could be farther from the truth. In reality, a significant amount of recorded deaths associated with the use of opioids has occurred in individuals that had taken the medication for a long time, and were taking their medication correctly. The following are examples of how this can happen: Patient taking his/her medication for a long time, as instructed, without any side effects, is given a certain antibiotic or another unrelated medication, which in turn triggers a Drug-to-drug interaction leading to disorientation, cognitive impairment, impaired reflexes, respiratory depression or an untoward event leading to serious bodily harm or injury, including death.  Patient taking his/her medication for a long time, as instructed, without any side effects, develops an acute impairment of liver and/or kidney function. This will lead to a rapid inability of the body to breakdown and eliminate their pain medication, which will result in effects similar to an overdose, but with the same medicine and dose that they had always taken. This again may lead to disorientation, cognitive impairment, impaired reflexes, respiratory depression or an untoward event leading to serious bodily harm or injury, including death.  A similar problem will occur with patients as they grow older and their liver and kidney function begins to decrease as part of the aging process.  Background information: Historically, the original case for using long-term opioid therapy to  treat chronic noncancer pain was based on safety assumptions that subsequent  experience has called into question. In 1996, the American Pain Society and the American Academy of Pain Medicine issued a consensus statement supporting long-term opioid therapy. This statement acknowledged the dangers of opioid prescribing but concluded that the risk for addiction was low; respiratory depression induced by opioids was short-lived, occurred mainly in opioid-naive patients, and was antagonized by pain; tolerance was not a common problem; and efforts to control diversion should not constrain opioid prescribing. This has now proven to be wrong. Experience regarding the risks for opioid addiction, misuse, and overdose in community practice has failed to support these assumptions.  According to the Centers for Disease Control and Prevention, fatal overdoses involving opioid analgesics have increased sharply over the past decade. Currently, more than 96,700 people die from drug overdoses every year. Opioids are a factor in 7 out of every 10 overdose deaths. Deaths from drug overdose have surpassed motor vehicle accidents as the leading cause of death for individuals between the ages of 33 and 33.  Clinical data suggest that neuroendocrine dysfunction may be very common in both men and women, potentially causing hypogonadism, erectile dysfunction, infertility, decreased libido, osteoporosis, and depression. Recent studies linked higher opioid dose to increased opioid-related mortality. Controlled observational studies reported that long-term opioid therapy may be associated with increased risk for cardiovascular events. Subsequent meta-analysis concluded that the safety of long-term opioid therapy in elderly patients has not been proven.   Side Effects and adverse reactions: Common side effects: Drowsiness (sedation). Dizziness. Nausea and vomiting. Constipation. Physical dependence -- Dependence often manifests with withdrawal symptoms when opioids are discontinued or decreased. Tolerance --  As you take repeated doses of opioids, you require increased medication to experience the same effect of pain relief. Respiratory depression -- This can occur in healthy people, especially with higher doses. However, people with COPD, asthma or other lung conditions may be even more susceptible to fatal respiratory impairment.  Uncommon side effects: An increased sensitivity to feeling pain and extreme response to pain (hyperalgesia). Chronic use of opioids can lead to this. Delayed gastric emptying (the process by which the contents of your stomach are moved into your small intestine). Muscle rigidity. Immune system and hormonal dysfunction. Quick, involuntary muscle jerks (myoclonus). Arrhythmia. Itchy skin (pruritus). Dry mouth (xerostomia).  Long-term side effects: Chronic constipation. Sleep-disordered breathing (SDB). Increased risk of bone fractures. Hypothalamic-pituitary-adrenal dysregulation. Increased risk of overdose.  RISKS: Respiratory depression and death: Opioids increase the risk of respiratory depression and death.  Drug-to-drug interactions: Opioids are relatively contraindicated in combination with benzodiazepines, sleep inducers, and other central nervous system depressants. Other classes of medications (i.e.: certain antibiotics and even over-the-counter medications) may also trigger or induce respiratory depression in some patients.  Medical conditions: Patients with pre-existing respiratory problems are at higher risk of respiratory failure and/or depression when in combination with opioid analgesics. Opioids are relatively contraindicated in some medical conditions such as central sleep apnea.   Fractures and Falls:  Opioids increase the risk and incidence of falls. This is of particular importance in elderly patients.  Endocrine System:  Long-term administration is associated with endocrine abnormalities (endocrinopathies). (Also known as Opioid-induced  Endocrinopathy) Influences on both the hypothalamic-pituitary-adrenal axis?and the hypothalamic-pituitary-gonadal axis have been demonstrated with consequent hypogonadism and adrenal insufficiency in both sexes. Hypogonadism and decreased levels of dehydroepiandrosterone sulfate have been reported in men and women. Endocrine effects include: Amenorrhoea in women (abnormal absence of menstruation) Reduced libido in both  sexes Decreased sexual function Erectile dysfunction in men Hypogonadisms (decreased testicular function with shrinkage of testicles) Infertility Depression and fatigue Loss of muscle mass Anxiety Depression Immune suppression Hyperalgesia Weight gain Anemia Osteoporosis Patients (particularly women of childbearing age) should avoid opioids. There is insufficient evidence to recommend routine monitoring of asymptomatic patients taking opioids in the long-term for hormonal deficiencies.  Immune System: Human studies have demonstrated that opioids have an immunomodulating effect. These effects are mediated via opioid receptors both on immune effector cells and in the central nervous system. Opioids have been demonstrated to have adverse effects on antimicrobial response and anti-tumour surveillance. Buprenorphine has been demonstrated to have no impact on immune function.  Opioid Induced Hyperalgesia: Human studies have demonstrated that prolonged use of opioids can lead to a state of abnormal pain sensitivity, sometimes called opioid induced hyperalgesia (OIH). Opioid induced hyperalgesia is not usually seen in the absence of tolerance to opioid analgesia. Clinically, hyperalgesia may be diagnosed if the patient on long-term opioid therapy presents with increased pain. This might be qualitatively and anatomically distinct from pain related to disease progression or to breakthrough pain resulting from development of opioid tolerance. Pain associated with hyperalgesia tends  to be more diffuse than the pre-existing pain and less defined in quality. Management of opioid induced hyperalgesia requires opioid dose reduction.  Cancer: Chronic opioid therapy has been associated with an increased risk of cancer among noncancer patients with chronic pain. This association was more evident in chronic strong opioid users. Chronic opioid consumption causes significant pathological changes in the small intestine and colon. Epidemiological studies have found that there is a link between opium  dependence and initiation of gastrointestinal cancers. Cancer is the second leading cause of death after cardiovascular disease. Chronic use of opioids can cause multiple conditions such as GERD, immunosuppression and renal damage as well as carcinogenic effects, which are associated with the incidence of cancers.   Mortality: Long-term opioid use has been associated with increased mortality among patients with chronic non-cancer pain (CNCP).  Prescription of long-acting opioids for chronic noncancer pain was associated with a significantly increased risk of all-cause mortality, including deaths from causes other than overdose.  Reference: Von Korff M, Kolodny A, Deyo RA, Chou R. Long-term opioid therapy reconsidered. Ann Intern Med. 2011 Sep 6;155(5):325-8. doi: 10.7326/[id number]. PMID: 78106373; PMCID: [id number]. Kit JINNY Laurence CINDERELLA Pearley JINNY, Hayward RA, Dunn KM, Jordan KP. Risk of adverse events in patients prescribed long-term opioids: A cohort study in the [place] Clinical Practice Research [employer]. Eur J Pain. 2019 May;23(5):908-922. doi: 10.1002/ejp.1357. Epub 2019 Jan 31. PMID: 69379883. Colameco S, Coren JS, Ciervo CA. Continuous opioid treatment for chronic noncancer pain: a time for moderation in prescribing. Postgrad Med. 2009 Jul;121(4):61-6. doi: 10.3810/pgm.2009.07.2032. PMID: 80358728. Gigi JONELLE Shlomo MILUS Levern IVER Conny RN, Colfax SD, Blazina I, Lonell DASEN,  Bougatsos C, Deyo RA. The effectiveness and risks of long-term opioid therapy for chronic pain: a systematic review for a [employer] of [employer]ways to [employer]. Ann Intern Med. 2015 Feb 17;162(4):276-86. doi: 10.7326/M14-2559. PMID: 74418742. Rory CHRISTELLA Laurence [facility], [place] DM. NCHS Data Brief No. 22. [place]: Centers for Disease Control and Prevention; 2009. Sep, Increase in Fatal Poisonings Involving Opioid Analgesics in the [place] , 1999-2006. Song IA, Choi HR, Oh TK. Long-term opioid use and mortality in patients with chronic non-cancer pain: Ten-year follow-up study in [name] from 2010 through 2019. EClinicalMedicine. 2022 Jul 18;51:101558. doi: 10.1016/j.eclinm.2022.898441. PMID: 64124182; PMCID: [id number]. Bearl MICAEL Finland, T., Vogelmann, T. et al. All-cause  mortality in patients with long-term opioid therapy compared with non-opioid analgesics for chronic non-cancer pain: a database study. BMC Med 18, 162 (2020). http://lester.info/ Rashidian H, Zendehdel K, Kamangar F, Malekzadeh R, Haghdoost AA. An Ecological Study of the Association between Opiate Use and Incidence of Cancers. Addict Health. 2016 Fall;8(4):252-260. PMID: 71180443; PMCID: [id number].  Our Goal: Our goal is to control your pain with means other than the use of opioid pain medications.  Our Recommendation: Talk to your physician about coming off of these medications. We can assist you with the tapering down and stopping these medicines. Based on the new information, even if you cannot completely stop the medication, a decrease in the dose may be associated with a lesser risk. Ask for other means of controlling the pain. Decrease or eliminate those factors that significantly contribute to your pain such as smoking, obesity, and a diet heavily tilted towards inflammatory nutrients.  Last Updated: 09/30/2022    ______________________________________________________________________       ______________________________________________________________________    Update on Controlled Substance (Opioid) Regulations   To: All patients taking opioid pain medications. (I.e.: hydrocodone , hydromorphone, oxycodone , oxymorphone, morphine , codeine, methadone, tapentadol, tramadol, buprenorphine, fentanyl , etc.)  Re: Review on the state of controlled substance regulations.  Introduction: Rules and regulations associated with all aspects of controlled substances are constantly being modified. Unfortunately we have encountered patients questioning the veracity of the information that we provide them about these changes. This is intended to provide them with appropriate references and a historical review of these changes.  A Brief History: As of January 08, 2016, the [place]  Government declared the opioid epidemic a public health emergency. Prescription drug monitoring programs (PDMPs) and the [facility] All Schedules Prescription Electronic Reporting Act (NASPER). Before 1800, clinicians regarded pain as an existential phenomenon, a consequence of aging. There was no regulation on the use of cocaine and opioids, resulting in widespread marketing and prescribing for many ailments ranging from diarrhea to toothache. The [employer] of [phone], passed in response to the sudden emergence of street heroin abuse as well as iatrogenic morphine  dependence, influenced both physician and patient alike to avoid opiates. Patients with unexplained pain in the 1920s were regarded as deluded, malingering, or abusers, and cancer patients through the 1950s were encouraged to wean themselves off opioids until their lives could be measured in weeks. Alongside this opioid evolution, the American Pain Society launched their influential pain as the fifth vital sign campaign in 1995. Concurrently, pharmaceutical companies  introduced new formulations, such as extended release oxycodone  (OxyContin ). From 1997 to 2002, OxyContin  prescriptions increased from 670,000 to 6.2 million. However, concerns soon began to surface regarding overzealous opioid treatment. It must be noted that pharmaceutical companies contributed significantly to the rise of the opioid epidemic, receiving considerable reprimands as a consequence. In 2007, as the opioid epidemic began to inflict profound damage, [employer] pleaded guilty to federal charges related to the misbranding of OxyContin . [employer] agreed to pay a total of $634.5 million to resolve Justice Department investigations, as well as a $19.5 million settlement to [address] and the [address] of [place].  In response to the current epidemic, changes in focus to the development of new abuse deterrent opioid formulations at the [place]  Food and Drug Administration (FDA) as well as drafting of new public standards for pain treatment were created at [facility] in 2017. In response to the opioid epidemic, FDA public policy changes were announced in February 2016. Among these new positions were a re-examination of  the risk-benefit paradigm for opioids with strict emphasis on the large public health ramifications. The various modified opioids released over the past 20 years, such as tamper-resistant preparation, have had differing levels of success, and are collectively referred to as Risk Evaluation and Mitigation Strategies (REMS). There is also a growing focus on preventing opioid use disorder (OUD) and on offering affected individuals accessible and effective treatment. [place]  government policy reflects these changes and both the Affordable Care Act and the Mental Health Parity and Addiction Equity Act were major steps forward in treating opioid addiction. The Affordable Care Act, which was signed into law in 05/18/2008, with major provisions coming into effect by May 18, 2012.  In the 1990s, the intensified marketing of  newly reformulated prescription opioid medications (e.g., OxyContin ) and an influential pain advocacy campaign that encouraged greater pain management led to a precipitous rise in opioid use in the [place] . Research from the Centers for Disease Control and Prevention (CDC) shows that prescription opioid sales in the [place]  quadrupled from 1997-05-18 to 18-May-2008. At the same time, opioid misuse and opioid-involved overdose deaths increased (Figure 1). Between 1997/05/18 and 05/18/2008, the rate of opioidinvolved overdose deaths in the [place]  doubled from 2.9 to 6.8 deaths per 100,000 people. This initial rise in opioid-related deaths is often referred to as the first wave of the recent opioid crisis.  Between 05-18-1997 and 2018-05-18, 565,000 Americans died of opioid-involved overdoses. In turn, federal, state, and local governments responded with various legal and policy efforts to curb opioid misuse and drug-related overdose Deaths.  Recent Congresses have enacted several laws addressing the opioid crisis, such as the Comprehensive Addiction and Recovery Act of 05-18-14 (CARA, P.L. 114-198); the 21st Century Cures Act (P.L. 114-255); the Substance UseDisorder Prevention that Promotes Opioid Recovery and Treatment for Patients and Communities Act (SUPPORT Act, P.L. 9735808370); the Fentanyl  Sanctions Act (Title LXXII of P.L. [id number]); and the Blocking Deadly Fentanyl  Imports Act (P.L. 117-81, 6610). These laws addressed overprescribing and misuse of opioids, expanded substance use disorder prevention and treatment capacities, bolstered drug diversion capabilities, and enhanced international drug interdiction, counternarcotics cooperation, and sanctions efforts. Congress also directed additional funds to many of these initiatives through appropriations.  Congress provided funding in the [employer] Act of May 19, 2019 [phone]; P.L. 117-2) for syringe services programs (often known as needle  exchange programs) and other harm reduction initiatives. Federal and state harm reduction strategies have frequently involved the distribution of naloxone  (e.g., Narcan )--a medication used to reverse an opioid overdose--and test strips used to detect fentanyl  in drug samples.  The Department of Justice (DOJ) and Department of Homeland Security [facility]) aim to reduce the diversion of prescription opioids and the use, manufacturing, and trafficking of illicit opioids. DOJ--via the Drug Enforcement Administration (DEA)--regulates opioid manufacturers, distributors, and dispensers; it also controls the opioid supply through enforcement of regulatory requirements.  A History of Opiate Laws in the [place]   Prior to 1888/05/18, laws concerning opiates were strictly imposed on a local city or state-by-state basis. One of the first was in [place] in 05-18-1873 where it became illegal to smoke opium  only in opium  dens. It did not ban the sale, import or use otherwise. In the next 25 years different states enacted opium  laws ranging from outlawing opium  dens altogether to making possession of opium , morphine  and heroin without a physicians prescription illegal.  The first Congressional Act took place in 05-18-1888 that levied taxes on morphine  and opium . From that time on the [employer]  has had a series of laws and acts directly aimed at opiate use, abuse and control. These are outlined below:  1906 - Pure Food and Drug Act Preventing the manufacture, sale, or transportation of adulterated or misbranded or poisonous or deleterious foods, drugs, medicines, and liquors, and for regulating traffic therein, and for other purposes. Punishment included fines and prison time.  1909   - Smoking Opium  Exclusion Act Banned the importation, possession and use of smoking opium . Did not regulate opium -based medications. First Freight forwarder banning the non-medical use of a substance.  1914  - The Margrette  Act In summary, The Margrette Act of 1914 was written more to have all parties involved in importing, exporting, set designer and distributing opium  or cocaine to register with the [employer] and have taxes levied upon them. Exempt from the law were physicians operating in the course of his professional practice  1919 - Supreme Court ratified the Bj's in [place] v. [place]  and [place]  [place]. [place], then again in [facility] v. [place] , in 1920, holding that doctors may not prescribe maintenance supplies of narcotics to people addicted to narcotics. However, it does not prohibit doctors from prescribing narcotics to wean a patient off of the drug. It was also the opinion of the court that prescribing narcotics to habitual users was not considered professional practice hence it then was considered illegal for doctors to prescribe opioids for the purposes of maintaining an addiction. It can be argued that todays addiction medications are not intended to maintain an addiction but to facilitate addiction remission. In which case, this opinion of the court should not preclude practitioners from prescribing buprenorphine or methadone to patients suffering from an addictive disorder.  1924  - Heroin Act Architectural technologist, importation and possession of heroin illegal - even for medicinal use.  1922 -- Narcotic Drug Import and Export Act Enacted to assure proper control of importation, sale, possession, production and consumption of narcotics.  1927  -- Special Educational Needs Teacher of Prohibition [employer] of Prohibition was responsible for tracking bootleggers and organized conservation officer, historic buildings. They focused primarily on interstate and international cases and those cases where local law enforcement official would not or could not act.  1932 -- Uniform State Narcotic Act Encouraged states to pass uniform state laws matching the federal Narcotic Drug Import and Export Act.  Suggested prohibiting cannabis use at the state level.  51 -- Food, Drug, and Cosmetic Act The new law brought cosmetics and medical devices under control, and it required that drugs be labeled with adequate directions for safe use. Moreover, it mandated pre-market approval of all new drugs, such that a manufacturer would have to prove to FDA that a drug were safe before it could be sold  1951 -- Boggs Act Imposed maximum criminal penalties for violations of the import/export and internal revenue laws related to drugs and also established mandatory minimum prison sentences.  1956 -- Narcotics Control Act Increased Boggs Act penalties and mandatory prison sentence minimums for violations of existing drug laws.  1965 -- Drug Abuse Control Amendment Enacted to deal with problems caused by abuse of depressants, stimulants and hallucinogens. Restricted research into psychoactive drugs such as LSD by requiring FDA approval.  1970 -- Controlled Substance Act  Controlled Substances Import and Export Act These laws are a consolidation of numerous laws regulating the manufacture and distribution of narcotics, stimulants, depressants, hallucinogens, anabolic steroids, and chemicals used in the illicit production of controlled substances. The  CSA places all substances that are regulated under existing federal law into one of five schedules. This placement is based upon the substance's medicinal value, harmfulness, and potential for abuse or addiction. Schedule I is reserved for the most dangerous drugs that have no recognized medical use, while Schedule V is the classification used for the least dangerous drugs. The act also provides a mechanism for substances to be controlled, added to a schedule, decontrolled, removed from control, rescheduled, or transferred from one schedule to another.  54 - Drug Enforcement Agency By Executive Order, the DEA was formed to take place of the [employer] of Narcotics and  Dangerous Drugs.  75 -Narcotic Addict Treatment Act of  1974  - Public Law [phone] Amends the Controlled Substance Act of 1970 to provide for the registration of practitioners conducting narcotic treatment programs. [methadone clinics] It also provides legal definitions for the phrases maintenance treatment and detoxification treatment.  1986 -- Anti-Drug Abuse Act of 1986 Strengthened Federal efforts to encourage foreign cooperation in eradicating illicit drug crops and in halting international drug traffic, to improve enforcement of Federal drug laws and enhance interdiction of illicit drug shipments, to provide strong Market researcher in establishing effective drug abuse prevention and education programs, to [employer] support for drug abuse treatment and rehabilitation efforts, and for other purposes. It also re-imposed mandatory sentencing minimums depending on which drug and how much was involved.  1988 -- Anti-Drug Abuse Act of 1988 Established the Office of Materials Engineer (ONDCP) in the [employer] of the Economist; authorized funds for [employer], state and local drug enforcement activities, school-based drug prevention efforts, and drug abuse treatment with special emphasis on injecting drug abusers at high risk for AIDS.  2000 -- Federal - The Drug Addiction Treatment Act of 2000 (DATA 2000) It enables qualified physicians to prescribe and/or dispense narcotics for the purpose of treating opioid dependency. For the first time, physicians are able to treat this disease from their private offices or other clinical settings. This presents a very desirable treatment option for those who are unwilling or unable to seek help in drug treatment clinics. Patients can now be treated in the privacy of their doctors office, as are other people being treated for any other type of medical condition. One medicine doctors may now prescribe is Buprenorphine. The major downfall of  this Act is the limitation of 30 patients per practice - which means that large facilities, no matter how many physicians are there, can only treat 30 patients at a time.  2002-- DEA reschedules buprenorphine from a schedule V drug to a schedule III drug, on December 29, 2000 - the day before the FDA approval of Suboxone and Subutex despite overwhelming objection by the medical community.  2004: June 2004 THE CONFIDENTIALITY OF ALCOHOL AND DRUG ABUSE PATIENT RECORDS REGULATION AND THE HIPAA PRIVACY RULE:  Confidentiality of Alcohol and Drug Dependence Patient Records (summary) Code of Federal Regulations Title 42 Part 2 (42 CFR Part 2)  The confidentiality of alcohol and drug dependence patient records maintained by this practice/program is protected by federal law and regulations. Generally, the practice/program may not say to a person outside the practice/program that a patient attends the practice/program, or disclose any information identifying a patient as being alcohol or drug dependent unless:  The patient consents in writing; The disclosure is allowed by a court order, or The disclosure is made to medical personnel in a medical emergency or to qualified personnel for research,  audit,  or practice/program evaluation. Violation of the federal law and regulations by a practice/program is a crime. Suspected violations may be reported to appropriate authorities in accordance with federal regulations. Freight forwarder and regulations do not protect any information about a crime committed by a patient either at the practice/program or against any person who works for the practice/program or about any threat to commit such a crime. Federal laws and regulations do not protect any information about suspected child abuse or neglect from being reported under state law to appropriate state or local authorities.  sample consent form (MS-WORD)  2005: 10-25-2003 Public law [phone], Amends the Controlled Substances  Act to eliminate the 30-patient limit for medical group practices allowed to dispense narcotic drugs in schedules III, IV, or V for maintenance or detoxification treatment (retains the 30-patient limit for an individual physician). This amendment removes the 30-patient limit on group medical practices that treat opioid dependence with buprenorphine. The restriction was part of the original Drug Addiction Treatment Act of 2000 (DATA) that allowed treatment of opioid dependence in a doctor's office. With this change, every certified doctor may now prescribe buprenorphine up to his or her individual physician limit of 30 patients.  2006: On 03/22/2005 President Levy signed Bill H.R.6344 into law. This allows physicians who have been certified to prescribe certain drugs for the treatment of opioid dependence under DATA2000 to treat up to 100 patients (up from 30) by submitting an intent notification to the Dept of Health and [employer]. This is a major step forward in both fighting the stigma and allowing access to treatment previously not available to some. For more details see 30/100-PATIENT LIMIT  2016: HHS augments regulations concerning the 30/100 patient limit by raising the limit to 275 for qualifying physicians. Link to summary of regulation  2016: Comprehensive Addiction and Recovery Act of 2016 (sec.303) amends the Controlled Substance Act - to allow Nurse Practitioners and Physician Assistants to become eligible to prescribe buprenorphine for the treatment of opioid use disorder. See the entire law for more details.  The roots of the concurrent regulation of certain drugs under two statutory schemes go back to the beginning of this century. In 1906, Congress enacted the Pure Food and Drug Act, establishing one regime of regulation to assure (among other things) that drugs were not adulterated or misbranded. These regulations were amended several times, recodified in 1938, and expanded on again  from the 1940s through the 1990s. Their implementation and enforcement is today assigned to the Food and Drug Administration (FDA) in the Department of Health and Human Services [facility]).  In 1914, Congress adopted the [place] Narcotic Act to stop abuse of addictive drugs. The Margrette Narcotic Act was amended in 1937 to include marijuana. In 1965, amphetamines, barbiturates, and hallucinogens came under regulation, but under the [employer], Drug, and Cosmetic Act. In 1970, these various statutes were consolidated and recodified as the Controlled Substances Act (CSA), which has been amended several times since then. Its implementation and enforcement is today assigned to the Drug Enforcement Administration (DEA) in the Department of Justice.  The first clash occurred after World War I, when so-called morphine  clinics existed and physicians prescribed or dispensed morphine  to addicts. Some addicts were veterans of the American Civil War, the Spanish-American War, and WWI, who had become addicted during treatment for war wounds, but most of them came from the growing population of nonmedical addicts ([place], 8017). The Narcotics Division of the Prohibition Unit of the Department of the [place],  which was then responsible for enforcing the [facility] Narcotic Act, concluded that this activity was not the legitimate practice of medicine but simple drug trafficking. The Treasury Department swiftly closed the clinics and made it personally and professionally risky for physicians to maintain a narcotic addict for any reason. In did so, however, only after the American Medical Association had adopted a resolution, in 1920, opposing ambulatory clinics''.  In 1972, the public health establishment, including the Secretary of Health, Education, and Welfare, the Education Officer, Environmental, the [employer] of [employer], and the Chemical Engineer for Drug Abuse Prevention, was unprepared to allow  [employer] of Narcotics and Dangerous Drugs, DEA's predecessor agency, to unilaterally define the parameters of medical practice for the use of methadone in the treatment of heroin addiction. As a consequence, a new set of rules--the third, on top of the FDA and DEA schemes--was added, one that inserted FDA deeply into the practice of medicine, notwithstanding its protestations to the contrary. Congress ratified this joint responsibility of law enforcement and public health officials for methadone through this third set of rules in 1974 with the passage of the Narcotic Addict Treatment Act (NATA). To examine in detail the evolution of this third set of rules--commonly referred to as the FDA or DHHS methadone regulations--we turn, first, to the period of the mid-1960s.  Increased use of heroin in the post World War II period first became apparent in the early to mid 1950s. During the [employer], a minimum mandatory narcotics law was enacted in 1956, effective July 1957. 1962 [facility] conference on drug abuse, the [employer] on Narcotic and Drug Abuse (the [employer]) of 1963, the Drug Abuse Control Amendments of 1965, the President's Commission on [employer] and Administration of Justice (the [employer]) of [phone], and the Narcotic Addiction Rehabilitation Act of 1966.  The 1965 Drug Abuse Control Amendments brought under strict federal control all nonnarcotic drugs capable of producing serious psychotoxic effects when abused. This act also created the [employer] of Drug Abuse Control within the Department of Health, Education, and Welfare (DHEW) and shifted the basis for aon corporation of illegal drugs from tax principles (administered by the Department of Treasury) to the regulation of commerce (administered by the [employer]).  The 1966 Narcotic Addiction Rehabilitation Act TOUR MANAGER) authorized the civil commitment of narcotic addicts, and federal  assistance to state and local governments to develop a local system of drug treatment programs. With respect to the latter, the [employer] of Mental Health [facility]) initially proposed the gradual implementation of the state assistance effort, mainly through a common mental health mechanism--inpatient treatment programs. However, because of a perceived pressing need, the courts began to commit addicts to these programs even before they were officially opened or staffed. The NARA legislation imposed the following contract requirements on treatment centers: (1) thrice-a-week counseling sessions; (2) weekly urine tests; (3) restorative dental services; (4) psychological consultations and vocational training; and (5) the treatment modalities of drug-free outpatient, therapeutic community, and methadone maintenance. Reorganization Plan No. 1 of 1968 transferred the primary functions of the [employer] of Narcotics (FBN) from the [employer] to the Department of Justice; it also transferred the [employer] of Drug Abuse Control functions to the Department of Justice. Within the [employer], the [employer] of Narcotics and Dangerous Drugs (BNDD) was created, which became the Drug Enforcement Administration in 1973.   Under the first [place] administration [phone]), federal drug abuse policy developed in a  significant way. These developments included a 1969 war on drugs presidential message, resulting legislation in 1970, and a Special Action Office created by executive order in 1971 and authorized in statute in 1972. Brynn, in 1969, to send a message to Congress on drug abuse. Although this was the first time that a U.[place]. president invoked the war on drugs image, it was in retrospect the most balanced approach to the problem of drug abuse that had been advanced. The 1969 message resulted in the submission of legislation to the Congress and the passage, the following year, of the Comprehensive Drug  Abuse Prevention and Control Act of 1970 [employer] [phone], January 18, 1969). The act dealt with research, treatment, and prevention of drug abuse and drug dependence, and with drug abuse charity fundraiser. One major purpose of the 1970 legislation was to reverse some of the strictures of the [employer] of 1914. The 1970 act sought to clarify for the medical profession . . . the extent to which they may safely go in treating narcotic addicts as patients. Title I, in Section IV, charged the Surveyor, Minerals, Education, and Welfare, to determine the appropriate methods of professional practice in the medical treatment of the narcotic addiction of various classes of narcotic addicts. This provision constitutes the initial statutory basis for treatment standards. The law enforcement sections consolidated all prior federal statutes into the Controlled Substances Act and the Controlled Substances Export and Import Act (Titles II and III, respectively, of the Comprehensive Drug Abuse Prevention and Control Act of 1970). Under this legislation, substances were classified under five schedules according to their abuse potential, and psychological and physical effects. Methadone was placed in Schedule II, along with such opiate drugs as morphine , codeine, and hydrocodone .  One of the most important steps taken by President Brynn was to establish in June 1971 the Special Action Office for Drug Abuse Prevention (SAODAP) in the [employer] of the President ([place] [phone], September 08, 1969). In mid-1971, [facility] appointed Dr. Maple Dunnings as SAODAP director. Within a year, the Drug Abuse Prevention Office and Treatment Act of 1972 [employer] [phone], June 13, 1970) gave statutory authority to [facility], but limiting setting, on September 21, 1973, as the limit on its existence.  The purpose of the 1972 act was to bring the resources of the federal government to bear on drug abuse with the immediate  objective of significantly reducing its incidence and developing a comprehensive, coordinated long-term federal strategy to combat drug abuse.  Narcotic Addict Treatment Act [employer]) of 1974 [employer] [phone]), which amended the Controlled Substances Act. This legislation was driven by concern for the diversion of methadone to illicit channels that was occurring in 1972 and 1973, as reflected in the title of the Senate bill adopted on August 31, 1971, the Methadone Diversion Control Act of 1973. (U.S. Senate, 1970a, 8029a).  The 1980 final rule (45 FR 37305, December 12, 1978) reduced the minimum standard for admission from two years of addiction to one year coupled with a clinical determination that the individual was currently physiologically.  The regulations were next revised in 1989, following two proposals to modify them, one in 1983 and one in 1987.  Under President Tanda Corrente, a government-wide effort was made to review all federal government regulations and to eliminate or reduce the burden of these regulations on the private sector, state and nash-finch company, and wps resources.   The 1983 recommendations, though not adopted, did initiate another revision  of the methadone regulations, which first found expression in a 1987 proposed rule (52 FR 37047, December 24, 1985) and culminated in a final rule (54 FR 8954, May 25, 1987) at the end of the decade. In the 1987 proposed rule, the FDA and NIDA, in an effort to put the best face on the unenthusiastic 1983 response by the provider community to converting the regulations to guidelines, indicated that they had retained the current requirements necessary to achieve the goals of the 1974 NATA, but were proposing to streamline the regulation and to promote more efficient operation of methadone programs. The 1987 proposed rule, issued by the FDA and NIDA, advanced the following changes in the methadone regulation: that detoxification  treatment be divided into short-term (<21 days) and long-term (>21 and <180 days) treatment; that the minimum staffing ratio of one counselor to 50 patients be eliminated; that blood tests be allowed as ways to conduct initial drug screening or to meet the monthly testing requirements for six-day take-home patients; that the 72-hour notification of FDA and the pertinent state authority for methadone doses greater than 100 mg be eliminated; that special adverse reaction reporting requirements for methadone be eliminated and reliance placed upon general FDA reporting requirements; that a supervising counselor be allowed to conduct the annual review of the patient's treatment plan for certain qualified patients who had been in treatment for 3 years or longer; and that the requirement of an annual report of methadone treatment programs to the FDA be dropped. The FDA and NIDA issued a final rule on May 25, 1987, based on comments on the 1987 proposed (54 FR 8954). Concurrently, FDA and NIDA issued a six-page guidance document, which noted that the regulations, over time, had recommended certain practices that were not actually required. Public Health Service, in Congress, and elsewhere, to reorganize the Alcohol, Drug Abuse, and Mental Health Administration (ADAMHA). These efforts culminated in the [employer] of 1992 [employer] [phone], October 02, 1990), the main purpose of which was to transfer the research portions of the three ADAMHA institutes--NIDA, the [employer] of Alcoholism and Alcohol Abuse, and the [employer] of Mental Health--to the [employer] and to create the Substance Abuse and Museum/gallery Exhibitions Officer [facility]) as the home for the service functions of these entitles.  Guidelines for Opioid Treatment The Federal Guidelines for Opioid Treatment Programs - 2015 serve as a guide to accrediting organizations for developing accreditation  standards. The guidelines also provide OTPs with information on how programs can achieve and maintain compliance with federal regulations. The 2015 guidelines are an update to the 2007 Guidelines for the Accreditation of Opioid Treatment Programs (PDF  547 KB). The new document reflects the obligation of OTPs to deliver care consistent with the patient-centered, integrated, and recovery-oriented standards of substance use treatment.  DPT oversees the certification of OTPs and provides guidance to nonprofit organizations and state governmental entities that want to become a SAMHSA-approved accrediting body. Learn more about the accreditation and certification of OTPs and [facility] oversight of OTP accreditation bodies.  Model Guidelines for [employer] With input from [facility], the Federation of [employer] in 2013 adopted a revised version of the federations office-based opioid treatment policies. The Model Policy on DATA 2000 and Treatment of Opioid Addiction in the Medical Office - 2013 (PDF  279 KB) provides model guidelines for use by state medical boards in regulating office-based opioid treatment.  Holiday Guidance for Opioid Treatment Programs (PDF  203 KB) In response to requests for the upcoming federal holidays and ensuing weekends (December 24th, 25th, and 26th and December 31st, Jan 1st, and Jan 2nd), this letter is to provide guidance regarding requests for unsupervised doses of medication for patients for these dates. View a sample SMA-168 (PDF  194 KB).  Federal regulation of drugs emerged as early as 70, under a law that addressed only imported drugs. In 1905 the [employer] launched a private, voluntary means of controlling a substantial part of the drug marketplace, a system that remained in place for over a half-century. Drug regulation in FDA has evolved considerably since President Ricardo Para signed the 1906 Pure Food and Drugs  Act.  1820 Eleven doctors set up the U.S. Pharmacopeia and record the first list of standard drugs. 1848 Drug Importation Act passed by Congress requires U.S. Customs Service inspection to stop entry of tainted, low quality drugs from overseas. 8116 Dr. Mitchell MICAEL Burrs becomes the chief chemist at the [facility] of [employer] adulteration studies.  1905 The American Medical Association [facility]) begins a voluntary program of drug approval that would last until 1955. In order to advertise in the [facility] and related journals, drug companies must show proof that the drug will treat what they claim. 1906 The original Food and Drug Act is passed by Congress on June 30 and signed by [employer]. The Act outlaws states from buying and selling food, drinks, and drugs that have been mislabeled and tainted. 1911 In U.S. v. Vicci, the [employer] that the [employer] and Drugs Act does not outlaw false medical claims but only false and misleading statements about the ingredients or identity of a drug. 1912 Congress passes the Brandt Amendment to overcome the ruling in U.S. v. Vicci. The Act outlaws labeling medicines with fake medical claims that is meant to trick the buyer. 1930 The name of the Food, Drug, and Insecticide Administration is shortened to Food and Drug Administration (FDA) under an therapist, music. 1933 FDA recommends a total rewrite of the out-of-date 1906 Food and Drugs Act.   1937 Elixir Sulfanilamide, contain the poisonous liquid, diethylene glycol, kills 107 persons, many of whom are children, dramatizing the need to establish drug safety before marketing and to pass the pending food and drug law. 1938 Congress passes [employer], Drug, and Cosmetic (FDC) Act of 1938, which requires that new drugs show safety before selling. This starts a new system of drug regulation. The Act also requires that safe limits be set for  unavoidable poisonous matter and allows for factory inspections. The Directv is given power to oversee advertising for all FDAregulated products except prescription drugs. FDA states that sulfanilamide and other dangerous drugs must be given under the direction of a medical expert. This begins the requirement for prescription only (nonnarcotic) drugs (see 1951 [place] amendment). 1941 Nearly 300 deaths and injuries result from the use of sulfathiazole tablets, an antibiotic, tainted with the sedative, phenobarbital. In response, FDA drastically changes manufacturing and quality controls. These changes lead to the development of good manufacturing practices (GMPs). 1948 The [employer] in U.S. v. Floretta that FDA jurisdiction extends to retail stores, thereby allowing FDA to stop illegal sales of drugs by pharmacies including barbiturates and amphetamines. 1950 In [employer]. v. U.S., a U.S. Court of Appeals rules that the directions for use on a drug label must include the drugs purpose. 1951 Congress passes the [employer], which  defines the kinds of drugs that cannot be used safely without medical supervision. The amendment limits sale of these drugs to prescription only by a medical professional. All other drugs are to be available without a prescription. 1952 A nationwide investigation by FDA reveals that chloramphenicol, an antibiotic, caused nearly 180 cases of often deadly blood diseases. Two years later FDA engages the [employer] of Hospital Pharmacists, the American Association of Medical Record Librarians, and later the American Medical Association in a voluntary program of drug reaction reporting. 1953 The [employer] Amendment clarifies previous law and requires FDA to give manufacturers written reports of conditions seen during inspections and results of factory samples. 1962 Thalidomide, a new  sleeping pill, causes severe birth defects of the arms and legs in thousands of babies born in [place]. The U.S. media reports on how Dr. Cathlean Mort, a FDA medical officer, helped prevent approval and marketing of Thalidomide in the [place] . These reports stirred up public support for stronger drug laws. 3 Congress passes the [employer]. For the first time, these laws require drug makers to prove their drug works before FDA can approve them for sale. The Advisory Committee on Investigational Drugs meets for the first time. This was the first meeting of a committee to advise FDA on product approval and policy on an ongoing basis. 1966 FDA contracts with the [employer] of [employer] to measure the effectiveness of 4,000 marketed drugs approved on the basis of safety alone between [phone] and 14-May-1960. The Fair Packaging and Labeling Act requires all consumer products, in interstate commerce, to be honestly and informatively labeled. May 14, 1966 FDA forms the Drug Efficacy Study Implementation (DESI) to carry out recommendations of the [employer] of the effectiveness of drugs first sold between [place] and February 20, 196220-Feb-1970 FDA requires the first patient package insert, medicines must come with information for the patient about risks and benefits. 1972 Over-the-Counter Drug Review begins to enhance the safety, effectiveness and appropriate labeling of drugs sold without prescription. 1973 The U.S. Supreme Court upholds the [place] drug effectiveness law and approves FDAs action to control entire classes of products. 1982 FDA issues Tamper-resistant Packaging Regulations to prevent poisonings such as deaths from cyanide placed in Tylenol  capsules. Congress passes the [employer] in May 14, 1981, making it a crime to tamper with packaged consumer products. 14-May-1982 Drug Price Nurse, Mental Health Act (Hatch-Waxman Act) increases the availability of less costly generic drugs by allowing FDA to approve applications for generic versions of brand-name drugs without repeating the research that proved the safety and effectiveness of the brand-name drugs. The Act also allowed brand-name companies to apply for up to five years additional patent protection for the new medicines they developed to make up for time lost while their products were going through FDA's approval process. 1989 The FDA issued guidelines asking drug makers to decide if a drug is likely to have usefulness in elderly people and to include elderly people in studies when applicable. 1991 In 05/15/1979, the FDA and the Department of Health and Human Services published a policy on protecting people in research. In 05-14-1989, this policy is adopted by more than a dozen federal agencies involved in human subject research and becomes known as the Common Rule. 4 1993 FDA launches MedWatch, a system designed to collect reports from health professionals on problems with drugs and other medical products. FDA issues guidelines for measuring gender differences in responses to medication. Drug  companies are encouraged to include patients of both sexes in their research of drugs and to study any gender-specific effects. 1995 FDA declares cigarettes to be drug delivery devices. Limits are issued on marketing and sales to reduce smoking by young people. 1998 FDA introduces the Adverse Event Reporting System (AERS), a computerized database designed to store and study safety reports on already marketed drugs.  The Demographic Rule requires that a marketing application review data on safety and effectiveness by age, gender, and race. The Pediatric Rule requires drug makers of selected new and existing drugs to conduct studies on drug safety and effectiveness in children. 1999 Creation of the Drug Facts Label for OTC drug  products. The law requires all overthe-counter drug labels to have information in a standard format. These drug facts labels are designed to give the user easy-to-find information. 2000 The U. S. [employer], upholds an earlier decision from The [employer] and Drug Administration v. [employer]. et al. and rules 5-4 that FDA does not have authority to regulate tobacco as a drug. 2002 The Best Pharmaceuticals for Children Act, in exchange for studying the drug in children, the drug maker gets six months of selling their product without competition. 2003 The Pediatric Research Equity Act gives FDA the right to ask drug companies to study the effectiveness of new drugs in children. 2004 FDA advises medical professionals to limit the use of a pain reliever called Cox-2, a nonsteroidal anti-inflammatory drug (NSAIDs). Studies had shown that long-term use raised chances of heart attacks and strokes. The warning is also added to the over-thecounter NSAIDs Drug Facts label. Medicines used in hospitals must have a bar code to prevent patients from receiving the wrong medicine. 5 2005 The Drug Safety Board is formed, consisting of FDA staff and representatives from the [employer] of [address] and the [employer]. The Board advises the Director, Center for Drug Evaluation and Research, FDA, on drug safety issues and works with the agency in sharing safety information to health professionals and patients.  The [place]  Food and Drug Administration (FDA) was first created to enforce the Pure Food and Drug Act of 1906. In this capacity, the FDA is charged with protecting the health of the US  public, to ensure the quality of its food, medicine, and cosmetics. Before this time, the [place]  government had no formal oversight of these products and left issues of quality and purity to the individual manufactures, or at times, individual states.    Review: [place] Stop  ACT. (The Strengthen Opioid Misuse Prevention (STOP) Act of 2017). GENERAL ASSEMBLY OF [place]  SESSION 2017 SESSION LAW 2017-74 HOUSE BILL 243  PMP mandatory The dispenser shall report: (1) The dispenser's DEA number. (2) The name of the patient for whom the controlled substance is being dispensed, and the patient's: a. Full address, including city, state, and zip code, b. Telephone number, and c. Date of birth. (3) The date the prescription was written. (4) The date the prescription was filled. (5) The prescription number. (6) Whether the prescription is new or a refill. (7) Metric quantity of the dispensed drug. (8) Estimated days of supply of dispensed drug, if provided to the dispenser. (9) National Drug Code of dispensed drug. (10) Prescriber's DEA number. (11) Method of payment for the prescription.  No paper prescriptions  Duration of scripts Acute vs Chronic prescribing  2016 CDC Guidelines for prescribing Opioids for Chronic Pain. (Updated in 2022.) Medical Board  Laws:  Prescription Laws Drug  laws, rules, and regulations are constantly changing. Any attempt to summarize them would quickly become outdated. Because of that, the Board encourages practitioners who seek guidance on prescribing procedures to refer to the sources listed below in addition to the Boards position statements, rules and Medical Practice Act.  [place]  Board of Pharmacy (NCBOP) (which offers the states pharmacy laws and rules, and links to the Code of Federal Regulations) [employer] Site: www.ncbop.org  [place]  General Statutes General Web Site: politicalpool.cz See: [place]  Food, Drug, and Cosmetic Act: S293155 & 106-134 See: [place]  Pharmacy Practice Act, Article 4A: [phone] See: [place]  Controlled Substances Act, Article 5: 90-86 & 90-113.8 See: Use of controlled substances to render one mentally incapacitated or physically helpless:  [facility]. Code, Title 21, Food & Drugs www.deadiversion.usdoj.gov Controlled Substances Schedules www.deadiversion.usdoj.gov Drug Warehouse Manager - www.deadiversion.usdoj.gov 42 CFR  8.12 - Federal opioid treatment standards.   Effective November 19, 2015, prior approval will be required for opioid analgesic doses for [facility]. Medicaid and [place] [facility]) beneficiaries which:  Exceed 120 mg of morphine  equivalents (MME) per day  Are greater than a 14-day supply of any opioid, or,  Are non-preferred opioid products on the [place] Medicaid Preferred Drug List (PDL)  FEDERAL 42 CFR  8.12 - Federal opioid treatment standards. Title II of the Comprehensive Drug Abuse Prevention and Control Act of 1970, commonly known as the Controlled Substance Act (CSA) Title 21 [place]  Code ([place]) Controlled Substances Act.   Reference:   ______________________________________________________________________       ______________________________________________________________________    Medication Rules  Purpose: To inform patients, and their family members, of our medication rules and regulations.  Applies to: All patients receiving prescriptions from our practice (written or electronic).  Pharmacy of record: This is the pharmacy where your electronic prescriptions will be sent. Make sure we have the correct one.  Electronic prescriptions: In compliance with the [place]  Strengthen Opioid Misuse Prevention (STOP) Act of 2017 (Session Law 2017-74/H243), effective March 25, 2018, all controlled substances must be electronically prescribed. Written prescriptions, faxing, or calling prescriptions to a pharmacy will no longer be done.  Prescription refills: These will be provided only during in-person appointments. No medications will be renewed without a face-to-face evaluation with your provider. Applies to all  prescriptions.  NOTE: The following applies primarily to controlled substances (Opioid* Pain Medications).   Type of encounter (visit): For patients receiving controlled substances, face-to-face visits are required. (Not an option and not up to the patient.)  Patient's Responsibilities: Pain Pills: Bring all pain pills to every appointment (except for procedure appointments). Pill counts are required.  Pill Bottles: Bring pills in original pharmacy bottle. Bring bottle, even if empty. Always bring the bottle of the most recent fill.  Medication refills: You are responsible for knowing and keeping track of what medications you are taking and when is it that you will need a refill. The day before your appointment: write a list of all prescriptions that need to be refilled. The day of the appointment: give the list to the admitting nurse. Prescriptions will be written only during appointments. No prescriptions will be written on procedure days. If you forget a medication: it will not be Called in, Faxed, or electronically sent. You will need to get another appointment to get these prescribed. No early refills. Do not call asking to have your prescription filled early. Partial  or short prescriptions: Occasionally your pharmacy may  not have enough pills to fill your prescription.  NEVER ACCEPT a partial fill or a prescription that is short of the total amount of pills that you were prescribed.  With controlled substances the law allows 72 hours for the pharmacy to complete the prescription.  If the prescription is not completed within 72 hours, the pharmacist will require a new prescription to be written. This means that you will be short on your medicine and we WILL NOT send another prescription to complete your original prescription.  Instead, request the pharmacy to send a carrier to a nearby branch to get enough medication to provide you with your full prescription. Prescription Accuracy: You  are responsible for carefully inspecting your prescriptions before leaving our office. Have the discharge nurse carefully go over each prescription with you, before taking them home. Make sure that your name is accurately spelled, that your address is correct. Check the name and dose of your medication to make sure it is accurate. Check the number of pills, and the written instructions to make sure they are clear and accurate. Make sure that you are given enough medication to last until your next medication refill appointment. Taking Medication: Take medication as prescribed. When it comes to controlled substances, taking less pills or less frequently than prescribed is permitted and encouraged. Never take more pills than instructed. Never take the medication more frequently than prescribed.  Inform other Doctors: Always inform, all of your healthcare providers, of all the medications you take. Pain Medication from other Providers: You are not allowed to accept any additional pain medication from any other Doctor or Healthcare provider. There are two exceptions to this rule. (see below) In the event that you require additional pain medication, you are responsible for notifying us , as stated below. Cough Medicine: Often these contain an opioid, such as codeine or hydrocodone . Never accept or take cough medicine containing these opioids if you are already taking an opioid* medication. The combination may cause respiratory failure and death. Medication Agreement: You are responsible for carefully reading and following our Medication Agreement. This must be signed before receiving any prescriptions from our practice. Safely store a copy of your signed Agreement. Violations to the Agreement will result in no further prescriptions. (Additional copies of our Medication Agreement are available upon request.) Laws, Rules, & Regulations: All patients are expected to follow all [address] and [employer], Rules,  [place] [employer]. Ignorance of the Laws does not constitute a valid excuse.  Illegal drugs and Controlled Substances: The use of illegal substances (including, but not limited to marijuana and its derivatives) and/or the illegal use of any controlled substances is strictly prohibited. Violation of this rule may result in the immediate and permanent discontinuation of any and all prescriptions being written by our practice. The use of any illegal substances is prohibited. Adopted CDC guidelines & recommendations: Target dosing levels will be at or below 60 MME/day. Use of benzodiazepines** is not recommended. Urine Drug testing: Patients taking controlled substances will be required to provide a urine sample upon request. Do not void before coming to your medication management appointments. Hold emptying your bladder until you are admitted. The admitting nurse will inform you if a sample is required. Our practice reserves the right to call you at any time to provide a sample. Once receiving the call, you have 24 hours to comply with request. Not providing a sample upon request may result in termination of medication therapy.  Exceptions: There are only two exceptions  to the rule of not receiving pain medications from other Healthcare Providers. Exception #1 (Emergencies): In the event of an emergency (i.e.: accident requiring emergency care), you are allowed to receive additional pain medication. However, you are responsible for: As soon as you are able, call our office [phone], at any time of the day or night, and leave a message stating your name, the date and nature of the emergency, and the name and dose of the medication prescribed. In the event that your call is answered by a member of our staff, make sure to document and save the date, time, and the name of the person that took your information.  Exception #2 (Planned Surgery): In the event that you are scheduled by another doctor or dentist to have  any type of surgery or procedure, you are allowed (for a period no longer than 30 days), to receive additional pain medication, for the acute post-op pain. However, in this case, you are responsible for picking up a copy of our Post-op Pain Management for Surgeons handout, and giving it to your surgeon or dentist. This document is available at our office, and does not require an appointment to obtain it. Simply go to our office during business hours (Monday-Thursday from 8:00 AM to 4:00 PM) (Friday 8:00 AM to 12:00 Noon) or if you have a scheduled appointment with us , prior to your surgery, and ask for it by name. In addition, you are responsible for: calling our office [phone], at any time of the day or night, and leaving a message stating your name, name of your surgeon, type of surgery, and date of procedure or surgery. Failure to comply with your responsibilities may result in termination of therapy involving the controlled substances.  Consequences:  Non-compliance with the above rules may result in permanent discontinuation of medication prescription therapy. All patients receiving any type of controlled substance is expected to comply with the above patient responsibilities. Not doing so may result in permanent discontinuation of medication prescription therapy. Medication Agreement Violation. Following the above rules, including your responsibilities will help you in avoiding a Medication Agreement Violation (Breaking your Pain Medication Contract).  *Opioid medications include: morphine , codeine, oxycodone , oxymorphone, hydrocodone , hydromorphone, meperidine, tramadol, tapentadol, buprenorphine, fentanyl , methadone. **Benzodiazepine medications include: diazepam  (Valium ), alprazolam (Xanax), clonazepam (Klonopine), lorazepam (Ativan), clorazepate (Tranxene), chlordiazepoxide (Librium), estazolam (Prosom), oxazepam (Serax), temazepam (Restoril), triazolam (Halcion) (Last updated:  01/15/2023) ______________________________________________________________________     ______________________________________________________________________    Medication Recommendations and Reminders  Applies to: All patients receiving prescriptions (written and/or electronic).  Medication Rules & Regulations: You are responsible for reading, knowing, and following our Medication Rules document. These exist for your safety and that of others. They are not flexible and neither are we. Dismissing or ignoring them is an act of non-compliance that may result in complete and irreversible termination of such medication therapy. For safety reasons, non-compliance will not be tolerated. As with the U.S. fundamental legal principle of ignorance of the law is no defense, we will accept no excuses for not having read and knowing the content of documents provided to you by our practice.  Pharmacy of record:  Definition: This is the pharmacy where your electronic prescriptions will be sent.  We do not endorse any particular pharmacy. It is up to you and your insurance to decide what pharmacy to use.  We do not restrict you in your choice of pharmacy. However, once we write for your prescriptions, we will NOT be re-sending more prescriptions to  fix restricted supply problems created by your pharmacy, or your insurance.  The pharmacy listed in the electronic medical record should be the one where you want electronic prescriptions to be sent. If you choose to change pharmacy, simply notify our nursing staff. Changes will be made only during your regular appointments and not over the phone.  Recommendations: Keep all of your pain medications in a safe place, under lock and key, even if you live alone. We will NOT replace lost, stolen, or damaged medication. We do not accept Police Reports as proof of medications having been stolen. After you fill your prescription, take 1 week's worth of pills and put  them away in a safe place. You should keep a separate, properly labeled bottle for this purpose. The remainder should be kept in the original bottle. Use this as your primary supply, until it runs out. Once it's gone, then you know that you have 1 week's worth of medicine, and it is time to come in for a prescription refill. If you do this correctly, it is unlikely that you will ever run out of medicine. To make sure that the above recommendation works, it is very important that you make sure your medication refill appointments are scheduled at least 1 week before you run out of medicine. To do this in an effective manner, make sure that you do not leave the office without scheduling your next medication management appointment. Always ask the nursing staff to show you in your prescription , when your medication will be running out. Then arrange for the receptionist to get you a return appointment, at least 7 days before you run out of medicine. Do not wait until you have 1 or 2 pills left, to come in. This is very poor planning and does not take into consideration that we may need to cancel appointments due to bad weather, sickness, or emergencies affecting our staff. DO NOT ACCEPT A Partial Fill: If for any reason your pharmacy does not have enough pills/tablets to completely fill or refill your prescription, do not allow for a partial fill. The law allows the pharmacy to complete that prescription within 72 hours, without requiring a new prescription. If they do not fill the rest of your prescription within those 72 hours, you will need a separate prescription to fill the remaining amount, which we will NOT provide. If the reason for the partial fill is your insurance, you will need to talk to the pharmacist about payment alternatives for the remaining tablets, but again, DO NOT ACCEPT A PARTIAL FILL, unless you can trust your pharmacist to obtain the remainder of the pills within 72 hours.  Prescription  refills and/or changes in medication(s):  Prescription refills, and/or changes in dose or medication, will be conducted only during scheduled medication management appointments. (Applies to both, written and electronic prescriptions.) No refills on procedure days. No medication will be changed or started on procedure days. No changes, adjustments, and/or refills will be conducted on a procedure day. Doing so will interfere with the diagnostic portion of the procedure. No phone refills. No medications will be called into the pharmacy. No Fax refills. No weekend refills. No Holliday refills. No after hours refills.  Remember:  Business hours are:  Monday to Thursday 8:00 AM to 4:00 PM Provider's Schedule: Eric Como, MD - Appointments are:  Medication management: Monday and Wednesday 8:00 AM to 4:00 PM Procedure day: Tuesday and Thursday 7:30 AM to 4:00 PM Wallie Sherry, MD - Appointments are:  Medication management: Tuesday and Thursday 8:00 AM to 4:00 PM Procedure day: Monday and Wednesday 7:30 AM to 4:00 PM (Last update: 01/15/2022) ______________________________________________________________________     ______________________________________________________________________    National Pain Medication Shortage  The [place] is experiencing worsening drug shortages. These have had a negative widespread effect on patient care and treatment. Not expected to improve any time soon. Predicted to last past 2029.   Drug shortage list (generic names) Oxycodone  IR Oxycodone /APAP Oxymorphone IR Hydromorphone Hydrocodone /APAP Morphine   Where is the problem?  Manufacturing and supply level.  Will this shortage affect you?  Only if you take any of the above pain medications.  How? You may be unable to fill your prescription.  Your pharmacist may offer a partial fill of your prescription. (Warning: Do not accept partial fills.) Prescriptions partially filled cannot be transferred  to another pharmacy. Read our Medication Rules and Regulation. Depending on how much medicine you are dependent on, you may experience withdrawals when unable to get the medication.  Recommendations: Consider ending your dependence on opioid pain medications. Ask your pain specialist to assist you with the process. Consider switching to a medication currently not in shortage, such as Buprenorphine. Talk to your pain specialist about this option. Consider decreasing your pain medication requirements by managing tolerance thru Drug Holidays. This may help minimize withdrawals, should you run out of medicine. Control your pain thru the use of non-pharmacological interventional therapies.   Your prescriber: Prescribers cannot be blamed for shortages. Medication manufacturing and supply issues cannot be fixed by the prescriber.   NOTE: The prescriber is not responsible for supplying the medication, or solving supply issues. Work with your pharmacist to solve it. The patient is responsible for the decision to take or continue taking the medication and for identifying and securing a legal supply source. By law, supplying the medication is the job and responsibility of the pharmacy. The prescriber is responsible for the evaluation, monitoring, and prescribing of these medications.   Prescribers will NOT: Re-issue prescriptions that have been partially filled. Re-issue prescriptions already sent to a pharmacy.  Re-send prescriptions to a different pharmacy because yours did not have your medication. Ask pharmacist to order more medicine or transfer the prescription to another pharmacy. (Read below.)  New 2023 regulation: November 23, 2021 Revised Regulation Allows DEA-Registered Pharmacies to Transfer Electronic Prescriptions at a Patients Request DEA Headquarters Division - Public Information Office Patients now have the ability to request their electronic prescription be transferred to another  pharmacy without having to go back to their practitioner to initiate the request. This revised regulation went into effect on Monday, November 19, 2021.     At a patients request, a DEA-registered retail pharmacy can now transfer an electronic prescription for a controlled substance (schedules II-V) to another DEA-registered retail pharmacy. Prior to this change, patients would have to go through their practitioner to cancel their prescription and have it re-issued to a different pharmacy. The process was taxing and time consuming for both patients and practitioners.    The Drug Enforcement Administration [facility]) published its intent to revise the process for transferring electronic prescriptions on February 11, 2020.  The final rule was published in the federal register on October 18, 2021 and went into effect 30 days later.  Under the final rule, a prescription can only be transferred once between pharmacies, and only if allowed under existing state or other applicable law. The prescription must remain in its electronic form; may not be altered in any  way; and the transfer must be communicated directly between two licensed pharmacists. Its important to note, any authorized refills transfer with the original prescription, which means the entire prescription will be filled at the same pharmacy.  Reference: hugehand.is [facility] website announcement)  Cheapwipes.at.pdf [employer])   [employer] / Vol. 88, No. 143 / Thursday, October 18, 2021 / Rules and Regulations DEPARTMENT OF JUSTICE  Drug Enforcement Administration  21 CFR Part 1306  [Docket No. [id number]]  RIN [id number] Transfer of Electronic Prescriptions for Schedules II-V Controlled Substances Between Pharmacies for Initial  Filling  ______________________________________________________________________       ______________________________________________________________________    Transfer of Pain Medication between Pharmacies  Re: 2023 DEA Clarification on existing regulation  Published on DEA Website: November 23, 2021  Title: Revised Regulation Allows DEA-Registered Pharmacies to Electrical Engineer Prescriptions at a Patients Request DEA Headquarters Division - [employer]  Patients now have the ability to request their electronic prescription be transferred to another pharmacy without having to go back to their practitioner to initiate the request. This revised regulation went into effect on Monday, November 19, 2021.     At a patients request, a DEA-registered retail pharmacy can now transfer an electronic prescription for a controlled substance (schedules II-V) to another DEA-registered retail pharmacy. Prior to this change, patients would have to go through their practitioner to cancel their prescription and have it re-issued to a different pharmacy. The process was taxing and time consuming for both patients and practitioners.    The Drug Enforcement Administration [facility]) published its intent to revise the process for transferring electronic prescriptions on February 11, 2020.  The final rule was published in the federal register on October 18, 2021 and went into effect 30 days later.  Under the final rule, a prescription can only be transferred once between pharmacies, and only if allowed under existing state or other applicable law. The prescription must remain in its electronic form; may not be altered in any way; and the transfer must be communicated directly between two licensed pharmacists. Its important to note, any authorized refills transfer with the original prescription, which means the entire prescription will be filled at the same pharmacy.    REFERENCES: 1. DEA website  announcement hugehand.is  2. Department of Justice website  Cheapwipes.at.pdf  3. DEPARTMENT OF JUSTICE Drug Enforcement Administration 21 CFR Part 1306 [Docket No. [id number]] RIN [id number]-AB64 Transfer of Electronic Prescriptions for Schedules II-V Controlled Substances Between Pharmacies for Initial Filling  ______________________________________________________________________       ______________________________________________________________________    Medication Transfer   Notification You are currently compliant and stable on your pain medication regimen. This regimen will be transferred today to your Primary Care Provider (PCP). You will be provided with enough prescriptions to last for 90 days. After that, your prescriptions will need to be taken over by your PCP.  Recommendation Immediately contact your primary care provider to secure an appointment for evaluation before this period is over. Do not wait until the last month to contact them.   Clarification The transfer of your medication regimen does not mean that you are being discharged from our clinic. We will remain available to you for any consultation or interventional therapies you may need.   Alternative Should you decide not to continue taking these medication and would like assistance in permanently stopping them, please let us  know so that we can design a slow tapering down of your regimen.  Reason Our primary responsibility  to provide specialized interventional pain management therapies otherwise not available to the community. We have in the past assisted primary care providers with reviewing and adjusting pain medication management therapies, however, we have been transparent to all patients and referring providers that it is not our intention to permanently take  over this type of therapy. Transfer of this portion of your care will assist us  in freeing time to assist others in need of our specialty services.   ______________________________________________________________________      ______________________________________________________________________    WARNING: CBD (cannabidiol) & Delta (Delta-8 tetrahydrocannabinol) products.   Applicable to:  All individuals currently taking or considering taking CBD (cannabidiol) and, more important, all patients taking opioid analgesic controlled substances (pain medication). (Example: oxycodone ; oxymorphone; hydrocodone ; hydromorphone; morphine ; methadone; tramadol; tapentadol; fentanyl ; buprenorphine; butorphanol; dextromethorphan; meperidine; codeine; etc.)  Introduction:  Recently there has been a drive towards the use of natural products for the treatment of different conditions, including pain anxiety and sleep disorders. Marijuana and hemp are two varieties of the cannabis genus plants. Marijuana and its derivatives are illegal, while hemp and its derivatives are not. Cannabidiol (CBD) and tetrahydrocannabinol (THC), are two natural compounds found in plants of the Cannabis genus. They can both be extracted from hemp or marijuana. Both compounds interact with your bodys endocannabinoid system in very different ways. CBD is associated with pain relief (analgesia) while THC is associated with the psychoactive effects (the high) obtained from the use of marijuana products. There are two main types of THC: Delta-9, which comes from the marijuana plant and it is illegal, and Delta-8, which comes from the hemp plant, and it is legal. (Both, Delta-9-THC and Delta-8-THC are psychoactive and give you the high.)   Legality:  Marijuana and its derivatives: illegal Hemp and its derivatives: Legal (State dependent) UPDATE: (05/11/2021) The Drug Enforcement Agency (DEA) issued a letter stating that delta  cannabinoids, including Delta-8-THCO and Delta-9-THCO, synthetically derived from hemp do not qualify as hemp and will be viewed as Schedule I drugs. (Schedule I drugs, substances, or chemicals are defined as drugs with no currently accepted medical use and a high potential for abuse. Some examples of Schedule I drugs are: heroin, lysergic acid diethylamide (LSD), marijuana (cannabis), 3,4-methylenedioxymethamphetamine (ecstasy), methaqualone, and peyote.) (cuetune.com.ee)  Legal status of CBD in [place]:  Conditionally Legal  Reference: FDA Regulation of Cannabis and Cannabis-Derived Products, Including Cannabidiol (CBD) - oemdeals.dk  Warning:  CBD is not FDA approved and has not undergo the same manufacturing controls as prescription drugs.  This means that the purity and safety of available CBD may be questionable. Most of the time, despite manufacturer's claims, it is contaminated with THC (delta-9-tetrahydrocannabinol - the chemical in marijuana responsible for the HIGH).  When this is the case, the [facility] contaminant will trigger a positive urine drug screen (UDS) test for Marijuana (carboxy-THC).   The FDA recently put out a warning about 5 things that everyone should be aware of regarding Delta-8 THC: Delta-8 THC products have not been evaluated or approved by the FDA for safe use and may be marketed in ways that put the public health at risk. The FDA has received adverse event reports involving delta-8 THC-containing products. Delta-8 THC has psychoactive and intoxicating effects. Delta-8 THC manufacturing often involve use of potentially harmful chemicals to create the concentrations of delta-8 THC claimed in the marketplace. The final delta-8 THC product may have potentially harmful by-products (contaminants) due to the chemicals used in the process. Manufacturing of  delta-8 THC products  may occur in uncontrolled or unsanitary settings, which may lead to the presence of unsafe contaminants or other potentially harmful substances. Delta-8 THC products should be kept out of the reach of children and pets.  NOTE: Because a positive UDS for any illicit substance is a violation of our medication agreement, your opioid analgesics (pain medicine) may be permanently discontinued.  MORE ABOUT CBD  General Information: CBD was discovered in 21 and it is a derivative of the cannabis sativa genus plants (Marijuana and Hemp). It is one of the 113 identified substances found in Marijuana. It accounts for up to 40% of the plant's extract. As of 2018, preliminary clinical studies on CBD included research for the treatment of anxiety, movement disorders, and pain. CBD is available and consumed in multiple forms, including inhalation of smoke or vapor, as an aerosol spray, and by mouth. It may be supplied as an oil containing CBD, capsules, dried cannabis, or as a liquid solution. CBD is thought not to be as psychoactive as THC (delta-9-tetrahydrocannabinol - the chemical in marijuana responsible for the HIGH). Studies suggest that CBD may interact with different biological target receptors in the body, including cannabinoid and other neurotransmitter receptors. As of 2018 the mechanism of action for its biological effects has not been determined.  Side-effects  Adverse reactions: Dry mouth, diarrhea, decreased appetite, fatigue, drowsiness, malaise, weakness, sleep disturbances, and others.  Drug interactions:  CBD may interact with medications such as blood-thinners. CBD causes drowsiness on its own and it will increase drowsiness caused by other medications, including antihistamines (such as Benadryl), benzodiazepines (Xanax, Ativan, Valium ), antipsychotics, antidepressants, opioids, alcohol and supplements such as kava, melatonin and St. John's Wort.  Other drug interactions:  Brivaracetam (Briviact); Caffeine ; Carbamazepine (Tegretol); Citalopram  (Celexa ); Clobazam (Onfi); Eslicarbazepine (Aptiom); Everolimus (Zostress); Lithium; Methadone (Dolophine); Rufinamide (Banzel); Sedative medications (CNS depressants); Sirolimus (Rapamune); Stiripentol (Diacomit); Tacrolimus (Prograf); Tamoxifen ; Soltamox); Topiramate  (Topamax ); Valproate; Warfarin (Coumadin); Zonisamide. (Last update: 03/04/2022) ______________________________________________________________________     ______________________________________________________________________    Muscle Spasms & Cramps  Cause(s):  Most common - vitamin and/or electrolyte (calcium , potassium, sodium, etc.) deficiencies. Post procedure - steroids (injected, oral, or inhaled) can make your kidneys excrete (loose) electrolytes. Most of the time this will not cause any symptoms however, if you happen to be borderline low on your electrolytes, it may temporarily triggering cramps & spasms.  Possible triggers: Sweating - causes loss of electrolytes thru the skin. Steroids - causes loss of electrolytes thru the urine.  Treatment: (over-the-counter)  Gatorade (or any other electrolyte-replenishing drink) - Take 1, 8 oz glass with each meal (3 times a day). Mechanism of action: Replenishes lost electrolytes. Magnesium  400 to 500 mg - Take 1 tablet twice a day (one with breakfast and one at bedtime). If you have kidney disease talk to your primary care physician before taking any Magnesium . Mechanism of action: Magnesium  is a natural muscle relaxant. Tonic Water with quinine - Take 1, 8 oz glass before bedtime.  Mechanism of action: Quinine is used to treat spasms.  Last Update: 10/03/2022  ______________________________________________________________________     ______________________________________________________________________    Procedure instructions  Stop blood-thinners  Do not eat or drink fluids (other than water)  for 8 hours before your procedure  No water for 2 hours before your procedure  Take your blood pressure medicine with a sip of water  Arrive 30 minutes before your appointment  If sedation is planned, bring suitable driver. [place], [place], & public transportation are NOT APPROVED)  Carefully read  the Preparing for your procedure detailed instructions  If you have questions call us  at [phone]  Procedure appointments are for procedures only.   NO medication refills or new problem evaluations will be done on procedure days.   Only the scheduled, pre-approved procedure and side will be done.   ______________________________________________________________________     ______________________________________________________________________    Preparing for your procedure  Appointments: If you think you may not be able to keep your appointment, call 24-48 hours in advance to cancel. We need time to make it available to others.  Procedure visits are for procedures only. During your procedure appointment there will be: NO Prescription Refills*. NO medication changes or discussions*. NO discussion of disability issues*. NO unrelated pain problem evaluations*. NO evaluations to order other pain procedures*. *These will be addressed at a separate and distinct evaluation encounter on the provider's evaluation schedule and not during procedure days.  Instructions: Food intake: Avoid eating anything solid for at least 8 hours prior to your procedure. Clear liquid intake: You may take clear liquids such as water up to 2 hours prior to your procedure. (No carbonated drinks. No soda.) Transportation: Unless otherwise stated by your physician, bring a driver. (Driver cannot be a Market Researcher, Pharmacist, Community, or any other form of public transportation.) Morning Medicines: Except for blood thinners, take all of your other morning medications with a sip of water. Make sure to take your heart and blood  pressure medicines. If your blood pressure's lower number is above 100, the case will be rescheduled. Blood thinners: Make sure to stop your blood thinners as instructed.  If you take a blood thinner, but were not instructed to stop it, call our office [phone] and ask to talk to a nurse. Not stopping a blood thinner prior to certain procedures could lead to serious complications. Diabetics on insulin : Notify the staff so that you can be scheduled 1st case in the morning. If your diabetes requires high dose insulin , take only  of your normal insulin  dose the morning of the procedure and notify the staff that you have done so. Preventing infections: Shower with an antibacterial soap the morning of your procedure.  Build-up your immune system: Take 1000 mg of Vitamin C with every meal (3 times a day) the day prior to your procedure. Antibiotics: Inform the nursing staff if you are taking any antibiotics or if you have any conditions that may require antibiotics prior to procedures. (Example: recent joint implants)   Pregnancy: If you are pregnant make sure to notify the nursing staff. Not doing so may result in injury to the fetus, including death.  Sickness: If you have a cold, fever, or any active infections, call and cancel or reschedule your procedure. Receiving steroids while having an infection may result in complications. Arrival: You must be in the facility at least 30 minutes prior to your scheduled procedure. Tardiness: Your scheduled time is also the cutoff time. If you do not arrive at least 15 minutes prior to your procedure, you will be rescheduled.  Children: Do not bring any children with you. Make arrangements to keep them home. Dress appropriately: There is always a possibility that your clothing may get soiled. Avoid long dresses. Valuables: Do not bring any jewelry or valuables.  Reasons to call and reschedule or cancel your procedure: (Following these recommendations will  minimize the risk of a serious complication.) Surgeries: Avoid having procedures within 2 weeks of any surgery. (Avoid for 2 weeks before or after any  surgery). Flu Shots: Avoid having procedures within 2 weeks of a flu shots or . (Avoid for 2 weeks before or after immunizations). Barium: Avoid having a procedure within 7-10 days after having had a radiological study involving the use of radiological contrast. (Myelograms, Barium swallow or enema study). Heart attacks: Avoid any elective procedures or surgeries for the initial 6 months after a Myocardial Infarction (Heart Attack). Blood thinners: It is imperative that you stop these medications before procedures. Let us  know if you if you take any blood thinner.  Infection: Avoid procedures during or within two weeks of an infection (including chest colds or gastrointestinal problems). Symptoms associated with infections include: Localized redness, fever, chills, night sweats or profuse sweating, burning sensation when voiding, cough, congestion, stuffiness, runny nose, sore throat, diarrhea, nausea, vomiting, cold or Flu symptoms, recent or current infections. It is specially important if the infection is over the area that we intend to treat. Heart and lung problems: Symptoms that may suggest an active cardiopulmonary problem include: cough, chest pain, breathing difficulties or shortness of breath, dizziness, ankle swelling, uncontrolled high or unusually low blood pressure, and/or palpitations. If you are experiencing any of these symptoms, cancel your procedure and contact your primary care physician for an evaluation.  Remember:  Regular Business hours are:  Monday to Thursday 8:00 AM to 4:00 PM  Provider's Schedule: Eric Como, MD:  Procedure days: Tuesday and Thursday 7:30 AM to 4:00 PM  Wallie Sherry, MD:  Procedure days: Monday and Wednesday 7:30 AM to 4:00 PM Last  Updated:  03/04/2023 ______________________________________________________________________     ______________________________________________________________________    Blood Thinners  IMPORTANT NOTICE:  If you take any of these, make sure to notify the nursing staff.  Failure to do so may result in serious injury.  Recommended time intervals to stop and restart blood-thinners, before & after invasive procedures  Generic Name Brand Name Pre-procedure: Stop medication for this amount of time before your procedure: Post-procedure: Wait this amount of time after the procedure before restarting your medication:  Abciximab Reopro 15 days 2 hrs  Alteplase Activase 10 days 10 days  Anagrelide Agrylin    Apixaban Eliquis 3 days 6 hrs  Cilostazol Pletal 3 days 5 hrs  Clopidogrel Plavix 7-10 days 2 hrs  Dabigatran Pradaxa 5 days 6 hrs  Dalteparin Fragmin 24 hours 4 hrs  Dipyridamole Aggrenox 11days 2 hrs  Edoxaban Lixiana; Savaysa 3 days 2 hrs  Enoxaparin   Lovenox  24 hours 4 hrs  Eptifibatide Integrillin 8 hours 2 hrs  Fondaparinux  Arixtra 72 hours 12 hrs  Hydroxychloroquine Plaquenil 11 days   Prasugrel Effient 7-10 days 6 hrs  Reteplase Retavase 10 days 10 days  Rivaroxaban Xarelto 3 days 6 hrs  Ticagrelor Brilinta 5-7 days 6 hrs  Ticlopidine Ticlid 10-14 days 2 hrs  Tinzaparin Innohep 24 hours 4 hrs  Tirofiban Aggrastat 8 hours 2 hrs  Warfarin Coumadin 5 days 2 hrs   Other medications with blood-thinning effects  NOTE: Consider stopping these if you have prolonged bleeding despite not taking any of the above blood thinners. Otherwise ask your provider and this will be decided on a case-by-case basis.  Product indications Generic (Brand) names Note  Cholesterol Lipitor Stop 4 days before procedure  Blood thinner (injectable) Heparin  (LMW or LMWH Heparin ) Stop 24 hours before procedure  Cancer Ibrutinib (Imbruvica) Stop 7 days before procedure  Malaria/Rheumatoid Hydroxychloroquine  (Plaquenil) Stop 11 days before procedure  Thrombolytics  10 days before or after procedures  Over-the-counter (OTC) Products with blood-thinning effects  NOTE: Consider stopping these if you have prolonged bleeding despite not taking any of the above blood thinners. Otherwise ask your provider and this will be decided on a case-by-case basis.  Product Common names Stop Time  Aspirin  > 325 mg Goody Powders, Excedrin, etc. 11 days  Aspirin  <= 81 mg  7 days  Fish oil  4 days  Garlic supplements  7 days  Ginkgo biloba  36 hours  Ginseng  24 hours  NSAIDs Ibuprofen , Naprosyn , etc. 3 days  Vitamin E  4 days   ______________________________________________________________________     "

## 2024-04-14 NOTE — Telephone Encounter (Signed)
 She states when she called the pharmacy to get her meds they told her it was too early. Then she said something about prior authorization. I never could figure it out while speaking to her what is needed,  and I'm not sure she knows either. Can someone look into this and call her. She states she doesn't have a car and her son is in [place] now so he can pick up her meds

## 2024-04-14 NOTE — Telephone Encounter (Signed)
 Spoke with [facility] in [place] pharmacy tech and she states they pain medication is not ready to be picked up. They added notification to notify patient when medication will be ready.

## 2024-04-14 NOTE — Telephone Encounter (Signed)
 PA done for Hydrocodone 

## 2024-04-14 NOTE — Progress Notes (Signed)
 Nursing Pain Medication Assessment:  Safety precautions to be maintained throughout the outpatient stay will include: orient to surroundings, keep bed in low position, maintain call bell within reach at all times, provide assistance with transfer out of bed and ambulation.  Medication Inspection Compliance: [name] did not comply with our request to bring her pills to be counted. She was reminded that bringing the medication bottles, even when empty, is a requirement.  Medication: None brought in. Pill/Patch Count: None available to be counted. Bottle Appearance: No container available. Did not bring bottle(s) to appointment. Filled Date: N/A Last Medication intake:  Ran out of medicine more than 48 hours ago

## 2024-04-14 NOTE — Telephone Encounter (Signed)
 The patient called and said when she called the pharmacy to get her meds they told her it was too early, then she said something about prior authorization so Im not sure what she needs.She states her son is in [place] now and he will need to pick up the meds because she doesn't have a car, so she wants someone to take care of this as soon as possible

## 2024-04-14 NOTE — Telephone Encounter (Signed)
 Will work on this

## 2024-04-15 NOTE — Telephone Encounter (Signed)
 Done

## 2024-05-26 ENCOUNTER — Ambulatory Visit: Admitting: Student in an Organized Health Care Education/Training Program

## 2024-06-07 ENCOUNTER — Encounter: Admitting: Nurse Practitioner
